# Patient Record
Sex: Female | Born: 1935 | Race: Black or African American | Hispanic: No | Smoking: Former smoker
Health system: Southern US, Community
[De-identification: ages and names within clinical notes are randomized; demographics above are authoritative.]

## PROBLEM LIST (undated history)

## (undated) ENCOUNTER — Emergency Department (HOSPITAL_COMMUNITY): Admission: EM | Payer: Medicare Other | Source: Home / Self Care

## (undated) DIAGNOSIS — E119 Type 2 diabetes mellitus without complications: Secondary | ICD-10-CM

## (undated) DIAGNOSIS — K219 Gastro-esophageal reflux disease without esophagitis: Secondary | ICD-10-CM

## (undated) DIAGNOSIS — E7800 Pure hypercholesterolemia, unspecified: Secondary | ICD-10-CM

## (undated) DIAGNOSIS — I639 Cerebral infarction, unspecified: Secondary | ICD-10-CM

## (undated) DIAGNOSIS — I447 Left bundle-branch block, unspecified: Secondary | ICD-10-CM

## (undated) DIAGNOSIS — I10 Essential (primary) hypertension: Secondary | ICD-10-CM

## (undated) DIAGNOSIS — E039 Hypothyroidism, unspecified: Secondary | ICD-10-CM

## (undated) DIAGNOSIS — I420 Dilated cardiomyopathy: Secondary | ICD-10-CM

## (undated) DIAGNOSIS — I5042 Chronic combined systolic (congestive) and diastolic (congestive) heart failure: Secondary | ICD-10-CM

## (undated) DIAGNOSIS — I2510 Atherosclerotic heart disease of native coronary artery without angina pectoris: Secondary | ICD-10-CM

## (undated) DIAGNOSIS — M1990 Unspecified osteoarthritis, unspecified site: Secondary | ICD-10-CM

## (undated) HISTORY — DX: Pure hypercholesterolemia, unspecified: E78.00

## (undated) HISTORY — DX: Dilated cardiomyopathy: I42.0

## (undated) HISTORY — DX: Cerebral infarction, unspecified: I63.9

## (undated) HISTORY — DX: Hypothyroidism, unspecified: E03.9

## (undated) HISTORY — PX: OTHER SURGICAL HISTORY: SHX169

## (undated) HISTORY — DX: Unspecified osteoarthritis, unspecified site: M19.90

## (undated) HISTORY — DX: Left bundle-branch block, unspecified: I44.7

## (undated) HISTORY — PX: BI-VENTRICULAR PACEMAKER INSERTION (CRT-P): SHX5750

---

## 1978-08-12 HISTORY — PX: ABDOMINAL HYSTERECTOMY: SHX81

## 2000-06-18 ENCOUNTER — Encounter: Payer: Self-pay | Admitting: Emergency Medicine

## 2000-06-18 ENCOUNTER — Emergency Department (HOSPITAL_COMMUNITY): Admission: EM | Admit: 2000-06-18 | Discharge: 2000-06-18 | Payer: Self-pay | Admitting: Emergency Medicine

## 2001-02-18 ENCOUNTER — Emergency Department (HOSPITAL_COMMUNITY): Admission: EM | Admit: 2001-02-18 | Discharge: 2001-02-18 | Payer: Self-pay | Admitting: Emergency Medicine

## 2002-10-29 ENCOUNTER — Emergency Department (HOSPITAL_COMMUNITY): Admission: EM | Admit: 2002-10-29 | Discharge: 2002-10-29 | Payer: Self-pay | Admitting: Emergency Medicine

## 2004-06-28 ENCOUNTER — Ambulatory Visit: Payer: Self-pay | Admitting: Endocrinology

## 2004-08-12 HISTORY — PX: OTHER SURGICAL HISTORY: SHX169

## 2004-08-22 ENCOUNTER — Ambulatory Visit: Payer: Self-pay | Admitting: Endocrinology

## 2004-08-29 ENCOUNTER — Ambulatory Visit: Payer: Self-pay | Admitting: Endocrinology

## 2004-11-19 ENCOUNTER — Ambulatory Visit: Payer: Self-pay | Admitting: Endocrinology

## 2004-11-27 ENCOUNTER — Encounter: Admission: RE | Admit: 2004-11-27 | Discharge: 2004-11-27 | Payer: Self-pay | Admitting: Endocrinology

## 2004-12-03 ENCOUNTER — Ambulatory Visit: Payer: Self-pay | Admitting: Endocrinology

## 2005-01-08 ENCOUNTER — Ambulatory Visit: Payer: Self-pay | Admitting: Endocrinology

## 2005-01-15 ENCOUNTER — Ambulatory Visit: Payer: Self-pay | Admitting: Endocrinology

## 2005-03-25 ENCOUNTER — Ambulatory Visit: Payer: Self-pay | Admitting: Family Medicine

## 2005-05-31 ENCOUNTER — Ambulatory Visit: Payer: Self-pay | Admitting: Family Medicine

## 2005-08-15 ENCOUNTER — Ambulatory Visit: Payer: Self-pay | Admitting: Family Medicine

## 2005-09-19 ENCOUNTER — Ambulatory Visit: Payer: Self-pay | Admitting: Family Medicine

## 2006-01-28 ENCOUNTER — Ambulatory Visit: Payer: Self-pay | Admitting: Family Medicine

## 2006-04-28 ENCOUNTER — Ambulatory Visit: Payer: Self-pay | Admitting: Family Medicine

## 2006-06-19 ENCOUNTER — Ambulatory Visit: Payer: Self-pay | Admitting: Family Medicine

## 2006-08-14 ENCOUNTER — Ambulatory Visit: Payer: Self-pay | Admitting: Family Medicine

## 2006-08-14 LAB — CONVERTED CEMR LAB
ALT: 15 units/L (ref 0–40)
AST: 20 units/L (ref 0–37)
Albumin: 3.8 g/dL (ref 3.5–5.2)
Alkaline Phosphatase: 46 units/L (ref 39–117)
BUN: 10 mg/dL (ref 6–23)
CO2: 28 meq/L (ref 19–32)
Calcium: 9.4 mg/dL (ref 8.4–10.5)
Chloride: 103 meq/L (ref 96–112)
Creatinine, Ser: 0.8 mg/dL (ref 0.4–1.2)
Creatinine,U: 130 mg/dL
GFR calc non Af Amer: 75 mL/min
Glomerular Filtration Rate, Af Am: 91 mL/min/{1.73_m2}
Glucose, Bld: 112 mg/dL — ABNORMAL HIGH (ref 70–99)
Hgb A1c MFr Bld: 8.6 % — ABNORMAL HIGH (ref 4.6–6.0)
Microalb Creat Ratio: 9.2 mg/g (ref 0.0–30.0)
Microalb, Ur: 1.2 mg/dL (ref 0.0–1.9)
Potassium: 3.9 meq/L (ref 3.5–5.1)
Sodium: 139 meq/L (ref 135–145)
Total Bilirubin: 0.6 mg/dL (ref 0.3–1.2)
Total Protein: 7.7 g/dL (ref 6.0–8.3)

## 2006-08-18 ENCOUNTER — Encounter: Payer: Self-pay | Admitting: Internal Medicine

## 2006-08-21 ENCOUNTER — Ambulatory Visit: Payer: Self-pay | Admitting: Family Medicine

## 2007-01-06 ENCOUNTER — Ambulatory Visit: Payer: Self-pay | Admitting: Family Medicine

## 2007-01-06 LAB — CONVERTED CEMR LAB
ALT: 13 units/L (ref 0–40)
AST: 20 units/L (ref 0–37)
Albumin: 3.6 g/dL (ref 3.5–5.2)
Alkaline Phosphatase: 47 units/L (ref 39–117)
BUN: 10 mg/dL (ref 6–23)
Bilirubin, Direct: 0.1 mg/dL (ref 0.0–0.3)
CO2: 29 meq/L (ref 19–32)
Calcium: 9.5 mg/dL (ref 8.4–10.5)
Chloride: 105 meq/L (ref 96–112)
Cholesterol: 258 mg/dL (ref 0–200)
Creatinine, Ser: 0.8 mg/dL (ref 0.4–1.2)
Direct LDL: 153.9 mg/dL
GFR calc Af Amer: 91 mL/min
GFR calc non Af Amer: 75 mL/min
Glucose, Bld: 96 mg/dL (ref 70–99)
HDL: 32.7 mg/dL — ABNORMAL LOW (ref >39.0)
Hgb A1c MFr Bld: 8.5 % — ABNORMAL HIGH (ref 4.6–6.0)
Potassium: 4.1 meq/L (ref 3.5–5.1)
Sodium: 140 meq/L (ref 135–145)
TSH: 1.53 microintl units/mL (ref 0.35–5.50)
Total Bilirubin: 0.5 mg/dL (ref 0.3–1.2)
Total CHOL/HDL Ratio: 7.9
Total Protein: 7.9 g/dL (ref 6.0–8.3)
Triglycerides: 245 mg/dL (ref 0–149)
VLDL: 49 mg/dL — ABNORMAL HIGH (ref 0–40)

## 2007-01-14 DIAGNOSIS — E039 Hypothyroidism, unspecified: Secondary | ICD-10-CM | POA: Insufficient documentation

## 2007-01-19 ENCOUNTER — Ambulatory Visit: Payer: Self-pay | Admitting: Family Medicine

## 2007-01-19 DIAGNOSIS — E109 Type 1 diabetes mellitus without complications: Secondary | ICD-10-CM | POA: Insufficient documentation

## 2007-01-19 DIAGNOSIS — E1140 Type 2 diabetes mellitus with diabetic neuropathy, unspecified: Secondary | ICD-10-CM

## 2007-01-19 DIAGNOSIS — E785 Hyperlipidemia, unspecified: Secondary | ICD-10-CM

## 2007-01-20 ENCOUNTER — Telehealth (INDEPENDENT_AMBULATORY_CARE_PROVIDER_SITE_OTHER): Payer: Self-pay | Admitting: *Deleted

## 2007-01-26 ENCOUNTER — Telehealth (INDEPENDENT_AMBULATORY_CARE_PROVIDER_SITE_OTHER): Payer: Self-pay | Admitting: *Deleted

## 2007-04-20 ENCOUNTER — Encounter: Payer: Self-pay | Admitting: Family Medicine

## 2007-07-20 ENCOUNTER — Encounter: Payer: Self-pay | Admitting: Family Medicine

## 2007-07-30 ENCOUNTER — Telehealth: Payer: Self-pay | Admitting: Internal Medicine

## 2007-08-19 ENCOUNTER — Encounter: Admission: RE | Admit: 2007-08-19 | Discharge: 2007-08-19 | Payer: Self-pay | Admitting: Urology

## 2007-08-21 ENCOUNTER — Ambulatory Visit (HOSPITAL_BASED_OUTPATIENT_CLINIC_OR_DEPARTMENT_OTHER): Admission: RE | Admit: 2007-08-21 | Discharge: 2007-08-21 | Payer: Self-pay | Admitting: Urology

## 2007-11-30 ENCOUNTER — Encounter: Payer: Self-pay | Admitting: Family Medicine

## 2008-03-14 ENCOUNTER — Encounter: Payer: Self-pay | Admitting: Family Medicine

## 2008-06-20 ENCOUNTER — Encounter: Payer: Self-pay | Admitting: Family Medicine

## 2009-06-21 ENCOUNTER — Ambulatory Visit: Payer: Self-pay | Admitting: Family Medicine

## 2009-08-14 ENCOUNTER — Ambulatory Visit: Payer: Self-pay | Admitting: Family Medicine

## 2009-08-17 ENCOUNTER — Ambulatory Visit: Payer: Self-pay | Admitting: Family Medicine

## 2009-08-17 LAB — CONVERTED CEMR LAB
ALT: 12 units/L (ref 0–35)
AST: 21 units/L (ref 0–37)
Albumin: 3.6 g/dL (ref 3.5–5.2)
Alkaline Phosphatase: 42 units/L (ref 39–117)
BUN: 13 mg/dL (ref 6–23)
Basophils Absolute: 0 10*3/uL (ref 0.0–0.1)
Basophils Relative: 0.6 % (ref 0.0–3.0)
Bilirubin, Direct: 0.1 mg/dL (ref 0.0–0.3)
CO2: 28 meq/L (ref 19–32)
Calcium: 9.5 mg/dL (ref 8.4–10.5)
Chloride: 106 meq/L (ref 96–112)
Cholesterol: 113 mg/dL (ref 0–200)
Creatinine, Ser: 0.8 mg/dL (ref 0.4–1.2)
Eosinophils Absolute: 0.1 10*3/uL (ref 0.0–0.7)
Eosinophils Relative: 1.3 % (ref 0.0–5.0)
GFR calc non Af Amer: 90.36 mL/min (ref >60)
Glucose, Bld: 144 mg/dL — ABNORMAL HIGH (ref 70–99)
HCT: 33.7 % — ABNORMAL LOW (ref 36.0–46.0)
HDL: 43.3 mg/dL (ref >39.00)
Hemoglobin: 11.1 g/dL — ABNORMAL LOW (ref 12.0–15.0)
Hgb A1c MFr Bld: 8.4 % — ABNORMAL HIGH (ref 4.6–6.5)
LDL Cholesterol: 47 mg/dL (ref 0–99)
Lymphocytes Relative: 39.5 % (ref 12.0–46.0)
Lymphs Abs: 2.4 10*3/uL (ref 0.7–4.0)
MCHC: 32.8 g/dL (ref 30.0–36.0)
MCV: 85.1 fL (ref 78.0–100.0)
Monocytes Absolute: 0.5 10*3/uL (ref 0.1–1.0)
Monocytes Relative: 8.3 % (ref 3.0–12.0)
Neutro Abs: 3.1 10*3/uL (ref 1.4–7.7)
Neutrophils Relative %: 50.3 % (ref 43.0–77.0)
Platelets: 243 10*3/uL (ref 150.0–400.0)
Potassium: 4 meq/L (ref 3.5–5.1)
RBC: 3.96 M/uL (ref 3.87–5.11)
RDW: 15.7 % — ABNORMAL HIGH (ref 11.5–14.6)
Sodium: 141 meq/L (ref 135–145)
TSH: 2.94 microintl units/mL (ref 0.35–5.50)
Total Bilirubin: 0.5 mg/dL (ref 0.3–1.2)
Total CHOL/HDL Ratio: 3
Total Protein: 7.4 g/dL (ref 6.0–8.3)
Triglycerides: 116 mg/dL (ref 0.0–149.0)
VLDL: 23.2 mg/dL (ref 0.0–40.0)
WBC: 6.1 10*3/uL (ref 4.5–10.5)

## 2009-08-18 ENCOUNTER — Telehealth (INDEPENDENT_AMBULATORY_CARE_PROVIDER_SITE_OTHER): Payer: Self-pay | Admitting: *Deleted

## 2009-08-18 ENCOUNTER — Telehealth: Payer: Self-pay | Admitting: Family Medicine

## 2009-08-21 ENCOUNTER — Telehealth: Payer: Self-pay | Admitting: Family Medicine

## 2010-04-04 ENCOUNTER — Ambulatory Visit: Payer: Self-pay | Admitting: Internal Medicine

## 2010-04-04 DIAGNOSIS — M1990 Unspecified osteoarthritis, unspecified site: Secondary | ICD-10-CM | POA: Insufficient documentation

## 2010-04-04 DIAGNOSIS — E559 Vitamin D deficiency, unspecified: Secondary | ICD-10-CM | POA: Insufficient documentation

## 2010-04-04 DIAGNOSIS — E1149 Type 2 diabetes mellitus with other diabetic neurological complication: Secondary | ICD-10-CM

## 2010-04-04 LAB — CONVERTED CEMR LAB
ALT: 13 units/L (ref 0–35)
AST: 20 units/L (ref 0–37)
Albumin: 4.2 g/dL (ref 3.5–5.2)
Alkaline Phosphatase: 62 units/L (ref 39–117)
BUN: 13 mg/dL (ref 6–23)
Basophils Absolute: 0 10*3/uL (ref 0.0–0.1)
Basophils Relative: 0.6 % (ref 0.0–3.0)
Bilirubin Urine: NEGATIVE
Bilirubin, Direct: 0.1 mg/dL (ref 0.0–0.3)
CO2: 26 meq/L (ref 19–32)
Calcium: 10.1 mg/dL (ref 8.4–10.5)
Chloride: 103 meq/L (ref 96–112)
Cholesterol: 149 mg/dL (ref 0–200)
Creatinine, Ser: 0.8 mg/dL (ref 0.4–1.2)
Eosinophils Absolute: 0.1 10*3/uL (ref 0.0–0.7)
Eosinophils Relative: 1.2 % (ref 0.0–5.0)
Folate: 14.7 ng/mL
GFR calc non Af Amer: 85.27 mL/min (ref >60)
Glucose, Bld: 159 mg/dL — ABNORMAL HIGH (ref 70–99)
HCT: 37.7 % (ref 36.0–46.0)
HDL: 53.5 mg/dL (ref >39.00)
Hemoglobin: 12.5 g/dL (ref 12.0–15.0)
Hgb A1c MFr Bld: 9.4 % — ABNORMAL HIGH (ref 4.6–6.5)
Ketones, ur: NEGATIVE mg/dL
LDL Cholesterol: 80 mg/dL (ref 0–99)
Leukocytes, UA: NEGATIVE
Lymphocytes Relative: 47.2 % — ABNORMAL HIGH (ref 12.0–46.0)
Lymphs Abs: 2.9 10*3/uL (ref 0.7–4.0)
MCHC: 33.1 g/dL (ref 30.0–36.0)
MCV: 83.5 fL (ref 78.0–100.0)
Monocytes Absolute: 0.3 10*3/uL (ref 0.1–1.0)
Monocytes Relative: 4.9 % (ref 3.0–12.0)
Neutro Abs: 2.8 10*3/uL (ref 1.4–7.7)
Neutrophils Relative %: 46.1 % (ref 43.0–77.0)
Nitrite: NEGATIVE
Platelets: 261 10*3/uL (ref 150.0–400.0)
Potassium: 4.3 meq/L (ref 3.5–5.1)
RBC: 4.51 M/uL (ref 3.87–5.11)
RDW: 16.5 % — ABNORMAL HIGH (ref 11.5–14.6)
Sodium: 140 meq/L (ref 135–145)
Specific Gravity, Urine: 1.01 (ref 1.000–1.030)
TSH: 10.04 microintl units/mL — ABNORMAL HIGH (ref 0.35–5.50)
Total Bilirubin: 0.5 mg/dL (ref 0.3–1.2)
Total CHOL/HDL Ratio: 3
Total Protein, Urine: NEGATIVE mg/dL
Total Protein: 8.1 g/dL (ref 6.0–8.3)
Triglycerides: 76 mg/dL (ref 0.0–149.0)
Urine Glucose: NEGATIVE mg/dL
Urobilinogen, UA: 0.2 (ref 0.0–1.0)
VLDL: 15.2 mg/dL (ref 0.0–40.0)
Vit D, 25-Hydroxy: 39 ng/mL (ref 30–89)
Vitamin B-12: 300 pg/mL (ref 211–911)
WBC: 6.1 10*3/uL (ref 4.5–10.5)
pH: 6 (ref 5.0–8.0)

## 2010-04-11 ENCOUNTER — Ambulatory Visit: Payer: Self-pay | Admitting: Internal Medicine

## 2010-04-19 ENCOUNTER — Telehealth: Payer: Self-pay | Admitting: Internal Medicine

## 2010-04-20 ENCOUNTER — Encounter: Payer: Self-pay | Admitting: Internal Medicine

## 2010-04-20 ENCOUNTER — Telehealth: Payer: Self-pay | Admitting: Internal Medicine

## 2010-04-20 ENCOUNTER — Telehealth (INDEPENDENT_AMBULATORY_CARE_PROVIDER_SITE_OTHER): Payer: Self-pay | Admitting: *Deleted

## 2010-04-26 ENCOUNTER — Telehealth: Payer: Self-pay | Admitting: Internal Medicine

## 2010-05-11 ENCOUNTER — Ambulatory Visit: Payer: Self-pay | Admitting: Internal Medicine

## 2010-09-13 NOTE — Progress Notes (Signed)
Summary: formulary alternative to Livalo--in process  Phone Note Other Incoming   Caller: [facility] Summary of Call: Received fax from [facility] requesting forumlary alternative or prior auth for Livalo. fax# [phone].  Form forwarded to provider for completion. Nicki Guadalajara Fergerson CMA Duncan Dull)  April 20, 2010 11:11 AM   Follow-up for Phone Call        Form completed and faxed to [phone]. Nicki Guadalajara Fergerson CMA Duncan Dull)  April 20, 2010 2:34 PM   Approved 04/23/10 case # [id number]. Follow-up by: Dagoberto Reef,  May 04, 2010 11:28 AM

## 2010-09-13 NOTE — Progress Notes (Signed)
Summary: RF  Phone Note Refill Request   Refills Requested: Medication #1:  ACCU-CHEK AVIVA  STRP check once daily dx: 250.00  Medication #2:  ACCU-CHEK MULTICLIX LANCETS  MISC test once daily dx:250.00. Pt informed, complete  Initial call taken by: Lamar Sprinkles, CMA,  April 26, 2010 10:37 AM    New/Updated Medications: ACCU-CHEK AVIVA  STRP (GLUCOSE BLOOD) check once daily dx: 250.00 ACCU-CHEK MULTICLIX LANCETS  MISC (LANCETS) test once daily dx:250.00 Prescriptions: ACCU-CHEK MULTICLIX LANCETS  MISC (LANCETS) test once daily dx:250.00  #100 x 3   Entered by:   Lamar Sprinkles, CMA   Authorized by:   [name] MD   Signed by:   Lamar Sprinkles, CMA on 04/26/2010   Method used:   Faxed to ...       [facility] [phone] (retail)       [address] [address]       [place], [place]  [id number]       Ph: [phone]       Fax: [phone]   RxID:   [id number] ACCU-CHEK AVIVA  STRP (GLUCOSE BLOOD) check once daily dx: 250.00  #100 x 3   Entered by:   Lamar Sprinkles, CMA   Authorized by:   [name] MD   Signed by:   Lamar Sprinkles, CMA on 04/26/2010   Method used:   Faxed to ...       [facility] [phone] (retail)       [address] [address]       [place], [place]  [id number]       Ph: [phone]       Fax: [phone]   RxID:   [id number]

## 2010-09-13 NOTE — Assessment & Plan Note (Signed)
Summary: NEW/ MEDICARE/TRICARE/ NWS  #   Vital Signs:  Patient profile:   77 year old female Height:      63 inches Weight:      148 pounds BMI:     26.31 O2 Sat:      95 % on Room air Temp:     99.2 degrees F oral Pulse rate:   84 / minute Pulse rhythm:   regular Resp:     16 per minute BP sitting:   140 / 82  (left arm) Cuff size:   large  Vitals Entered By: Rock Nephew CMA (April 04, 2010 1:19 PM)  Nutrition Counseling: Patient's BMI is greater than 25 and therefore counseled on weight management options.  O2 Flow:  Room air CC: New pt transfer, Preventive Care Is Patient Diabetic? Yes Did you bring your meter with you today? No Pain Assessment Patient in pain? no       Does patient need assistance? Functional Status Self care Ambulation Normal   Primary Care Radiah Lubinski:  [name] MD  CC:  New pt transfer and Preventive Care.  History of Present Illness: New to me she wants a new PCP. She reports today to discuss a long standing hx. of "nerve pains" in her legs and feet that occurs only at night and very intermittently. She says that the pain is sometimes a "cramp". It appears that she has had the presumed diagnosis of DPN but I see no evidence of a NCS/EMG being done and her Endo notes state that her s/s are atypical for DPN. Also, she has tried the appropriate meds for DPN ( Elavil, lyrica, tramadol ) and she did not improve. Her pain is not claudication.  Preventive Screening-Counseling & Management  Alcohol-Tobacco     Alcohol drinks/day: 0     Smoking Status: never  Caffeine-Diet-Exercise     Does Patient Exercise: no  Hep-HIV-STD-Contraception     Hepatitis Risk: no risk noted     HIV Risk: no risk noted     STD Risk: no risk noted      Drug Use:  no.        Blood Transfusions:  no.    Clinical Review Panels:  Lipid Management   Cholesterol:  113 (08/14/2009)   LDL (bad choesterol):  47 (08/14/2009)   HDL (good cholesterol):  43.30  (08/14/2009)  Diabetes Management   HgBA1C:  8.4 (08/14/2009)   Creatinine:  0.8 (08/14/2009)   Last Foot Exam:  yes (04/04/2010)   Last Flu Vaccine:  Fluvax 3+ (06/21/2009)  CBC   WBC:  6.1 (08/14/2009)   RBC:  3.96 (08/14/2009)   Hgb:  11.1 (08/14/2009)   Hct:  33.7 (08/14/2009)   Platelets:  243.0 (08/14/2009)   MCV  85.1 (08/14/2009)   MCHC  32.8 (08/14/2009)   RDW  15.7 (08/14/2009)   PMN:  50.3 (08/14/2009)   Lymphs:  39.5 (08/14/2009)   Monos:  8.3 (08/14/2009)   Eosinophils:  1.3 (08/14/2009)   Basophil:  0.6 (08/14/2009)  Complete Metabolic Panel   Glucose:  144 (08/14/2009)   Sodium:  141 (08/14/2009)   Potassium:  4.0 (08/14/2009)   Chloride:  106 (08/14/2009)   CO2:  28 (08/14/2009)   BUN:  13 (08/14/2009)   Creatinine:  0.8 (08/14/2009)   Albumin:  3.6 (08/14/2009)   Total Protein:  7.4 (08/14/2009)   Calcium:  9.5 (08/14/2009)   Total Bili:  0.5 (08/14/2009)   Alk Phos:  42 (08/14/2009)  SGPT (ALT):  12 (08/14/2009)   SGOT (AST):  21 (08/14/2009)   Medications Prior to Update: 1)  Metformin Hcl 1000 Mg Tabs (Metformin Hcl) 2)  Amaryl 4 Mg Tabs (Glimepiride) 3)  Actos 45 Mg Tabs (Pioglitazone Hcl) .Marland Kitchen.. 1 By Mouth Once Daily 4)  Vytorin 10-40 Mg Tabs (Ezetimibe-Simvastatin) .Marland Kitchen.. 1 By Mouth Once Daily 5)  Synthroid 88 Mcg Tabs (Levothyroxine Sodium) .Marland Kitchen.. 1 By Mouth Once Daily 6)  Vitamin D (Ergocalciferol) 50000 Unit Caps (Ergocalciferol) .Marland Kitchen.. 1 By Mouth Once Weekly. 7)  Ultram 50 Mg Tabs (Tramadol Hcl) .Marland Kitchen.. 1-2 By Mouth Every 6 Hours As Needed 8)  Ultracet 37.5-325 Mg Tabs (Tramadol-Acetaminophen) .Marland Kitchen.. 1-2 By Mouth Every 6 Hours. Do Not Take Tylenol With This Medication.  Current Medications (verified): 1)  Metformin Hcl 1000 Mg Tabs (Metformin Hcl) 2)  Amaryl 4 Mg Tabs (Glimepiride) 3)  Actos 45 Mg Tabs (Pioglitazone Hcl) .Marland Kitchen.. 1 By Mouth Once Daily 4)  Synthroid 100 Mcg Tabs (Levothyroxine Sodium) .... One By Mouth Once Daily 5)  Vitamin D  (Ergocalciferol) 50000 Unit Caps (Ergocalciferol) .Marland Kitchen.. 1 By Mouth Once Weekly. 6)  Livalo 4 Mg Tabs (Pitavastatin Calcium) .... One By Mouth Once Daily For Cholesterol  Allergies (verified): No Known Drug Allergies  Comments:  Nurse/Medical Assistant: The patient's medications were reviewed with the patient and were updated in the Medication List. Rock Nephew CMA (April 04, 2010 1:20 PM)  Past History:  Family History: Last updated: 01/14/2007 Family History Diabetes 1st degree relative Family History Hypertension Family History Uterine cancer  Social History: Last updated: 04/04/2010 Occupation: Retired Engineer, civil (consulting) Alcohol use-no Divorced Never Smoked Drug use-no Regular exercise-no  Risk Factors: Alcohol Use: 0 (04/04/2010) Exercise: no (04/04/2010)  Risk Factors: Smoking Status: never (04/04/2010)  Past Medical History: Hypothyroidism High Cholesterol Osteoarthritis Diabetes mellitus, type II  Past Surgical History: Reviewed history from 01/14/2007 and no changes required. Hysterectomy-1980 Colonoscopy-2006 BMD-2006  Family History: Reviewed history from 01/14/2007 and no changes required. Family History Diabetes 1st degree relative Family History Hypertension Family History Uterine cancer  Social History: Reviewed history from 01/14/2007 and no changes required. Occupation: Retired Engineer, civil (consulting) Alcohol use-no Divorced Never Smoked Drug use-no Regular exercise-no Smoking Status:  never Hepatitis Risk:  no risk noted HIV Risk:  no risk noted STD Risk:  no risk noted Blood Transfusions:  no Drug Use:  no Does Patient Exercise:  no  Review of Systems  The patient denies anorexia, fever, weight loss, weight gain, chest pain, syncope, dyspnea on exertion, peripheral edema, prolonged cough, headaches, hemoptysis, abdominal pain, hematuria, muscle weakness, suspicious skin lesions, depression, enlarged lymph nodes, and angioedema.   MS:  Complains of joint  pain and cramps; denies joint redness, joint swelling, loss of strength, low back pain, muscle aches, muscle weakness, stiffness, and thoracic pain. Endo:  Denies cold intolerance, excessive hunger, excessive thirst, excessive urination, heat intolerance, polyuria, and weight change.  Physical Exam  General:  alert, well-developed, well-nourished, well-hydrated, appropriate dress, normal appearance, healthy-appearing, cooperative to examination, and good hygiene.   Head:  normocephalic, atraumatic, no abnormalities observed, and no abnormalities palpated.   Eyes:  vision grossly intact, pupils equal, pupils round, and pupils reactive to light.   Ears:  R ear normal and L ear normal.   Mouth:  Oral mucosa and oropharynx without lesions or exudates.  Teeth in good repair. Neck:  supple, full ROM, no masses, no thyromegaly, no JVD, normal carotid upstroke, no carotid bruits, no cervical lymphadenopathy, and no neck tenderness.  Lungs:  normal respiratory effort, no intercostal retractions, no accessory muscle use, normal breath sounds, no dullness, no fremitus, no crackles, and no wheezes.   Heart:  normal rate, regular rhythm, no murmur, no gallop, no rub, and no JVD.   Abdomen:  soft, non-tender, normal bowel sounds, no distention, no masses, no guarding, no rigidity, no rebound tenderness, no abdominal hernia, no inguinal hernia, no hepatomegaly, and no splenomegaly.   Msk:  normal ROM, no joint tenderness, no joint swelling, no joint warmth, no redness over joints, no joint deformities, no joint instability, and no crepitation.   Pulses:  R and L carotid,radial,femoral,dorsalis pedis and posterior tibial pulses are full and equal bilaterally Extremities:  No clubbing, cyanosis, edema, or deformity noted with normal full range of motion of all joints.   Neurologic:  alert & oriented X3, cranial nerves II-XII intact, strength normal in all extremities, sensation intact to light touch, sensation  intact to pinprick, gait normal, and DTRs symmetrical and normal.   Skin:  turgor normal, color normal, no rashes, no suspicious lesions, no ecchymoses, no petechiae, no purpura, no ulcerations, and no edema.   Cervical Nodes:  no anterior cervical adenopathy and no posterior cervical adenopathy.   Axillary Nodes:  no R axillary adenopathy and no L axillary adenopathy.   Inguinal Nodes:  no R inguinal adenopathy and no L inguinal adenopathy.   Psych:  Oriented X3, memory intact for recent and remote, normally interactive, good eye contact, not anxious appearing, not depressed appearing, not agitated, not suicidal, not homicidal, easily distracted, and poor concentration.    Diabetes Management Exam:    Foot Exam (with socks and/or shoes not present):       Sensory-Pinprick/Light touch:          Left medial foot (L-4): normal          Left dorsal foot (L-5): normal          Left lateral foot (S-1): normal          Right medial foot (L-4): normal          Right dorsal foot (L-5): normal          Right lateral foot (S-1): normal       Sensory-Monofilament:          Left foot: normal          Right foot: normal       Inspection:          Left foot: normal          Right foot: normal       Nails:          Left foot: normal          Right foot: normal   Impression & Recommendations:  Problem # 1:  HYPOTHYROIDISM (ICD-244.9) her TSH is a little high so I will increase her Synthroid dose Her updated medication list for this problem includes:    Synthroid 100 Mcg Tabs (Levothyroxine sodium) ..... One by mouth once daily  Orders: Venipuncture (47829) T-Vitamin D (25-Hydroxy) [phone]) TLB-Lipid Panel (80061-LIPID) TLB-BMP (Basic Metabolic Panel-BMET) (80048-METABOL) TLB-CBC Platelet - w/Differential (85025-CBCD) TLB-Hepatic/Liver Function Pnl (80076-HEPATIC) TLB-TSH (Thyroid Stimulating Hormone) (84443-TSH) TLB-B12 + Folate Pnl (84696_29528-U13/KGM) TLB-A1C / Hgb A1C  (Glycohemoglobin) (83036-A1C) TLB-Udip w/ Micro (81001-URINE)  Problem # 2:  DIABETES MELLITUS, TYPE II (ICD-250.00) Assessment: Deteriorated  Her updated medication list for this problem includes:    Metformin Hcl 1000 Mg Tabs (Metformin hcl)  Amaryl 4 Mg Tabs (Glimepiride)    Actos 45 Mg Tabs (Pioglitazone hcl) .Marland Kitchen... 1 by mouth once daily  Labs Reviewed: Creat: 0.8 (08/14/2009)    Reviewed HgBA1c results: 8.4 (08/14/2009)  8.5 (01/06/2007)  Orders: Ophthalmology Referral (Ophthalmology)  Problem # 3:  VITAMIN D DEFICIENCY (ICD-268.9) Assessment: Unchanged  Orders: Venipuncture (19147) T-Vitamin D (25-Hydroxy) (82956-21308) T-Bone Densitometry (65784) TLB-Lipid Panel (80061-LIPID) TLB-BMP (Basic Metabolic Panel-BMET) (80048-METABOL) TLB-CBC Platelet - w/Differential (85025-CBCD) TLB-Hepatic/Liver Function Pnl (80076-HEPATIC) TLB-TSH (Thyroid Stimulating Hormone) (84443-TSH) TLB-B12 + Folate Pnl (82746_82607-B12/FOL) TLB-A1C / Hgb A1C (Glycohemoglobin) (83036-A1C) TLB-Udip w/ Micro (81001-URINE)  Problem # 4:  OSTEOARTHRITIS (ICD-715.90) Assessment: Unchanged  The following medications were removed from the medication list:    Ultram 50 Mg Tabs (Tramadol hcl) .Marland Kitchen... 1-2 by mouth every 6 hours as needed    Ultracet 37.5-325 Mg Tabs (Tramadol-acetaminophen) .Marland Kitchen... 1-2 by mouth every 6 hours. do not take tylenol with this medication.  Orders: Venipuncture (69629) T-Vitamin D (25-Hydroxy) (52841-32440) TLB-Lipid Panel (80061-LIPID) TLB-BMP (Basic Metabolic Panel-BMET) (80048-METABOL) TLB-CBC Platelet - w/Differential (85025-CBCD) TLB-Hepatic/Liver Function Pnl (80076-HEPATIC) TLB-TSH (Thyroid Stimulating Hormone) (84443-TSH) TLB-B12 + Folate Pnl (10272_53664-Q03/KVQ) TLB-A1C / Hgb A1C (Glycohemoglobin) (83036-A1C) TLB-Udip w/ Micro (81001-URINE)  Problem # 5:  DIABETIC PERIPHERAL NEUROPATHY (ICD-250.60) Assessment: Deteriorated  Her updated medication list for  this problem includes:    Metformin Hcl 1000 Mg Tabs (Metformin hcl)    Amaryl 4 Mg Tabs (Glimepiride)    Actos 45 Mg Tabs (Pioglitazone hcl) .Marland Kitchen... 1 by mouth once daily  Orders: Venipuncture (25956) T-Vitamin D (25-Hydroxy) (38756-43329) TLB-Lipid Panel (80061-LIPID) TLB-BMP (Basic Metabolic Panel-BMET) (80048-METABOL) TLB-CBC Platelet - w/Differential (85025-CBCD) TLB-Hepatic/Liver Function Pnl (80076-HEPATIC) TLB-TSH (Thyroid Stimulating Hormone) (84443-TSH) TLB-B12 + Folate Pnl (51884_16606-T01/SWF) TLB-A1C / Hgb A1C (Glycohemoglobin) (83036-A1C) TLB-Udip w/ Micro (81001-URINE) Neurology Referral (Neuro)  Problem # 6:  HYPERLIPIDEMIA, MIXED (ICD-272.2) Assessment: Deteriorated will stop simva and see if this helps with pain , also will start Livalo since it has a lower incidence of causing myopathy The following medications were removed from the medication list:    Vytorin 10-40 Mg Tabs (Ezetimibe-simvastatin) .Marland Kitchen... 1 by mouth once daily Her updated medication list for this problem includes:    Livalo 4 Mg Tabs (Pitavastatin calcium) ..... One by mouth once daily for cholesterol  Orders: Venipuncture (09323) T-Vitamin D (25-Hydroxy) (55732-20254) TLB-Lipid Panel (80061-LIPID) TLB-BMP (Basic Metabolic Panel-BMET) (80048-METABOL) TLB-CBC Platelet - w/Differential (85025-CBCD) TLB-Hepatic/Liver Function Pnl (80076-HEPATIC) TLB-TSH (Thyroid Stimulating Hormone) (84443-TSH) TLB-B12 + Folate Pnl (27062_37628-B15/VVO) TLB-A1C / Hgb A1C (Glycohemoglobin) (83036-A1C) TLB-Udip w/ Micro (81001-URINE)  Complete Medication List: 1)  Metformin Hcl 1000 Mg Tabs (Metformin hcl) 2)  Amaryl 4 Mg Tabs (Glimepiride) 3)  Actos 45 Mg Tabs (Pioglitazone hcl) .Marland Kitchen.. 1 by mouth once daily 4)  Synthroid 100 Mcg Tabs (Levothyroxine sodium) .... One by mouth once daily 5)  Vitamin D (ergocalciferol) 50000 Unit Caps (Ergocalciferol) .Marland Kitchen.. 1 by mouth once weekly. 6)  Livalo 4 Mg Tabs (Pitavastatin  calcium) .... One by mouth once daily for cholesterol  Other Orders: Radiology Referral (Radiology)  Colorectal Screening:  Current Recommendations:    Colonoscopy recommended: scheduled with G.[facility].  Osteoporosis Risk Assessment:  Risk Factors for Fracture or Low Bone Density:   Smoking status:       never   Thyroid replacement:     yes   Thyroid disease:     yes  Immunization & Chemoprophylaxis:    Influenza vaccine: Fluvax 3+  (06/21/2009)  Patient Instructions: 1)  Please schedule a follow-up appointment in  1 month. 2)  It is important that you exercise regularly at least 20 minutes 5 times a week. If you develop chest pain, have severe difficulty breathing, or feel very tired , stop exercising immediately and seek medical attention. 3)  You need to lose weight. Consider a lower calorie diet and regular exercise.  4)  Schedule your mammogram. 5)  Take an Aspirin every day. 6)  Check your blood sugars regularly. If your readings are usually above 200 or below 70 you should contact our office. 7)  It is important that your Diabetic A1c level is checked every 3 months. 8)  See your eye doctor yearly to check for diabetic eye damage. 9)  Check your feet each night for sore areas, calluses or signs of infection. 10)  Check your Blood Pressure regularly. If it is above: you should make an appointment. 11)  Take 650-1000mg  of Tylenol every 4-6 hours as needed for relief of pain or comfort of fever AVOID taking more than 4000mg   in a 24 hour period (can cause liver damage in higher doses). Prescriptions: SYNTHROID 100 MCG TABS (LEVOTHYROXINE SODIUM) one by mouth once daily  #90 x 3   Entered and Authorized by:   [name] MD   Signed by:   [name] MD on 04/04/2010   Method used:   Print then Give to Patient   RxID:   [id number] SYNTHROID 100 MCG TABS (LEVOTHYROXINE SODIUM) one by mouth once daily  #90 x 3   Entered and Authorized by:   [name] MD   Signed  by:   [name] MD on 04/04/2010   Method used:   Print then Give to Patient   RxID:   [id number] METFORMIN HCL 1000 MG TABS (METFORMIN HCL)   #180 x 3   Entered and Authorized by:   [name] MD   Signed by:   [name] MD on 04/04/2010   Method used:   Print then Give to Patient   RxID:   [id number] LIVALO 4 MG TABS (PITAVASTATIN CALCIUM) One by mouth once daily for cholesterol  #70 x 0   Entered and Authorized by:   [name] MD   Signed by:   [name] MD on 04/04/2010   Method used:   Print then Give to Patient   RxID:   [id number]

## 2010-09-13 NOTE — Letter (Signed)
Summary: Results Follow-up Letter  [facility]  [address] [place], [place] [address]   Phone: [phone]  Fax: [phone]    04/04/2010  [address] [place], [place]  [address]  Dear Ms. Gowdy,   The following are the results of your recent test(s):  Test     Result     Blood sugars   way too high Thyroid dose    too low CBC       normal Liver/kidney   normal Urine       normal   _________________________________________________________  Please call for an appointment soon _________________________________________________________ _________________________________________________________ _________________________________________________________  Sincerely,  Sanda Linger MD [facility]

## 2010-09-13 NOTE — Letter (Signed)
Summary: Lipid Letter  [facility]  [address] [place], [place] [address]   Phone: [phone]  Fax: [phone]    04/04/2010  [name] [name] [address] [place], [place]  [address]  Dear Ms. Shedrick:  We have carefully reviewed your last lipid profile from 04/04/2010 and the results are noted below with a summary of recommendations for lipid management.    Cholesterol:       149     Goal: <200   HDL "good" Cholesterol:   57.84     Goal: >50   LDL "bad" Cholesterol:   80     Goal: <100   Triglycerides:       76.0     Goal: <150        TLC Diet (Therapeutic Lifestyle Change): Saturated Fats & Transfatty acids should be kept < 7% of total calories ***Reduce Saturated Fats Polyunstaurated Fat can be up to 10% of total calories Monounsaturated Fat Fat can be up to 20% of total calories Total Fat should be no greater than 25-35% of total calories Carbohydrates should be 50-60% of total calories Protein should be approximately 15% of total calories Fiber should be at least 20-30 grams a day ***Increased fiber may help lower LDL Total Cholesterol should be < 200mg /day Consider adding plant stanol/sterols to diet (example: Benacol spread) ***A higher intake of unsaturated fat may reduce Triglycerides and Increase HDL    Adjunctive Measures (may lower LIPIDS and reduce risk of Heart Attack) include: Aerobic Exercise (20-30 minutes 3-4 times a week) Limit Alcohol Consumption Weight Reduction Aspirin 75-81 mg a day by mouth (if not allergic or contraindicated) Dietary Fiber 20-30 grams a day by mouth     Current Medications: 1)    Metformin Hcl 1000 Mg Tabs (Metformin hcl) 2)    Amaryl 4 Mg Tabs (Glimepiride) 3)    Actos 45 Mg Tabs (Pioglitazone hcl) .Marland Kitchen.. 1 by mouth once daily 4)    Synthroid 88 Mcg Tabs (Levothyroxine sodium) .Marland Kitchen.. 1 by mouth once daily 5)    Vitamin D (ergocalciferol) 50000 Unit Caps (Ergocalciferol) .Marland Kitchen.. 1 by mouth once weekly. 6)     Livalo 4 Mg Tabs (Pitavastatin calcium) .... One by mouth once daily for cholesterol  If you have any questions, please call. We appreciate being able to work with you.   Sincerely,    [facility] [name] MD

## 2010-09-13 NOTE — Progress Notes (Signed)
Summary: Phone-med  Phone Note Call from Patient Call back at Home Phone [phone]   Caller: Patient Summary of Call: Patient states that everytime she takes augmentine 875mg . she vomits. She would like for Dr. Laury Axon to prescribe her another med. Initial call taken by: Barb Merino,  August 21, 2009 1:06 PM  Follow-up for Phone Call        ceftin 500 mg two times a day for 10 days  Follow-up by: Loreen Freud DO,  August 21, 2009 4:50 PM  Additional Follow-up for Phone Call Additional follow up Details #1::        pt is aware. Army Fossa CMA  August 21, 2009 4:59 PM    New/Updated Medications: CEFTIN 500 MG TABS (CEFUROXIME AXETIL) 1 by mouth two times a day Prescriptions: CEFTIN 500 MG TABS (CEFUROXIME AXETIL) 1 by mouth two times a day  #20 x 0   Entered by:   Army Fossa CMA   Authorized by:   Loreen Freud DO   Signed by:   Army Fossa CMA on 08/21/2009   Method used:   Electronically to        [facility] [address] (retail)       [phone]       [place], [place]  [address]       Ph: [phone]       Fax: [phone]   RxID:   [id number] CEFTIN 500 MG TABS (CEFUROXIME AXETIL) 1 by mouth two times a day  #20 x 0   Entered and Authorized by:   Loreen Freud DO   Signed by:   Loreen Freud DO on 08/21/2009   Method used:   Historical   RxID:   [id number]

## 2010-09-13 NOTE — Progress Notes (Signed)
Summary: refill  Phone Note Refill Request Message from:  Fax from Pharmacy on [facility] [phone] fax [phone]  Refills Requested: Medication #1:  ULTRAM 50 MG TABS 1-2 by mouth every 6 hours as needed. Temporaily out of stock . Patient wants to fill with ultracet may we change? If so do you want same sig and qty?   Initial call taken by: Barb Merino,  August 18, 2009 2:30 PM  Follow-up for Phone Call        already sent. Army Fossa CMA  August 18, 2009 2:32 PM

## 2010-09-13 NOTE — Medication Information (Signed)
Summary: Livalo / [facility]  Livalo / [facility]   Imported By: Lennie Odor 04/24/2010 16:13:26  _____________________________________________________________________  External Attachment:    Type:   Image     Comment:   External Document

## 2010-09-13 NOTE — Progress Notes (Signed)
Summary: PT AT PHARMACY NOW NEEDS MED  Phone Note Call from Patient Call back at [phone] [facility]   Caller: Patient Call For: Loreen Freud DO Summary of Call: PATIENT CALLING FROM [facility].  SHE IS THERE TO PICK-UP RX FOR TRAMADOL & THEY ARE OUT OF IT.  PATIENT IS WAITING AT PHARMACY NOW & WANTS [place] TO CALL THEM BACK & HAVE THEM FILL ULTRACET AS AN ALTERNATIVE UNTIL THEY GET MORE TRAMADOL.  PLEASE CALL PHARMACY NOW PER PT.  Drue Novel - [phone]. Initial call taken by: Magdalen Spatz [facility],  August 18, 2009 2:08 PM  Follow-up for Phone Call        PER DR LOWNE OKAY TO CHANGE. Army Fossa CMA  August 18, 2009 2:29 PM    New/Updated Medications: ULTRACET 37.5-325 MG TABS (TRAMADOL-ACETAMINOPHEN) 1-2 by mouth every 6 hours. DO NOT TAKE TYLENOL WITH THIS MEDICATION. Prescriptions: ULTRACET 37.5-325 MG TABS (TRAMADOL-ACETAMINOPHEN) 1-2 by mouth every 6 hours. DO NOT TAKE TYLENOL WITH THIS MEDICATION.  #60 x 0   Entered by:   Army Fossa CMA   Authorized by:   Loreen Freud DO   Signed by:   Army Fossa CMA on 08/18/2009   Method used:   Faxed to ...       [facility] [address] (retail)       [phone]       [place], [place]  [address]       Ph: [phone]       Fax: [phone]   RxID:   [id number]

## 2010-09-13 NOTE — Assessment & Plan Note (Signed)
Summary: FU ON LABS/NWS  #   Vital Signs:  Patient profile:   77 year old female Height:      63 inches Weight:      143 pounds BMI:     25.42 O2 Sat:      97 % on Room air Temp:     98.8 degrees F oral Pulse rate:   78 / minute Pulse rhythm:   regular Resp:     16 per minute BP sitting:   138 / 68  (left arm) Cuff size:   large  Vitals Entered By: Rock Nephew CMA (April 11, 2010 1:52 PM)  Nutrition Counseling: Patient's BMI is greater than 25 and therefore counseled on weight management options.  O2 Flow:  Room air CC: follow-up visit// discuss lab results and refills Is Patient Diabetic? Yes Did you bring your meter with you today? No Pain Assessment Patient in pain? no       Does patient need assistance? Functional Status Self care Ambulation Normal   Primary Care Provider:  [name] MD  CC:  follow-up visit// discuss lab results and refills.  History of Present Illness:  Follow-Up Visit      This is a 77 year old woman who presents for Follow-up visit.  The patient complains of high blood sugar symptoms, but denies chest pain, palpitations, dizziness, syncope, low blood sugar symptoms, edema, SOB, DOE, PND, and orthopnea.  Since the last visit the patient notes no new problems or concerns.  The patient reports taking meds as prescribed, monitoring BP, monitoring blood sugars, and dietary noncompliance.  When questioned about possible medication side effects, the patient notes none.    Preventive Screening-Counseling & Management  Alcohol-Tobacco     Alcohol drinks/day: 0     Smoking Status: never     Tobacco Counseling: not indicated; no tobacco use  Hep-HIV-STD-Contraception     Hepatitis Risk: no risk noted     HIV Risk: no risk noted     STD Risk: no risk noted      Drug Use:  no.        Blood Transfusions:  no.    Clinical Review Panels:  Lipid Management   Cholesterol:  149 (04/04/2010)   LDL (bad choesterol):  80 (04/04/2010)  HDL (good cholesterol):  53.50 (04/04/2010)  Diabetes Management   HgBA1C:  9.4 (04/04/2010)   Creatinine:  0.8 (04/04/2010)   Last Foot Exam:  yes (04/11/2010)   Last Flu Vaccine:  Fluvax 3+ (06/21/2009)  CBC   WBC:  6.1 (04/04/2010)   RBC:  4.51 (04/04/2010)   Hgb:  12.5 (04/04/2010)   Hct:  37.7 (04/04/2010)   Platelets:  261.0 (04/04/2010)   MCV  83.5 (04/04/2010)   MCHC  33.1 (04/04/2010)   RDW  16.5 (04/04/2010)   PMN:  46.1 (04/04/2010)   Lymphs:  47.2 (04/04/2010)   Monos:  4.9 (04/04/2010)   Eosinophils:  1.2 (04/04/2010)   Basophil:  0.6 (04/04/2010)  Complete Metabolic Panel   Glucose:  159 (04/04/2010)   Sodium:  140 (04/04/2010)   Potassium:  4.3 (04/04/2010)   Chloride:  103 (04/04/2010)   CO2:  26 (04/04/2010)   BUN:  13 (04/04/2010)   Creatinine:  0.8 (04/04/2010)   Albumin:  4.2 (04/04/2010)   Total Protein:  8.1 (04/04/2010)   Calcium:  10.1 (04/04/2010)   Total Bili:  0.5 (04/04/2010)   Alk Phos:  62 (04/04/2010)   SGPT (ALT):  13 (04/04/2010)   SGOT (  AST):  20 (04/04/2010)   Medications Prior to Update: 1)  Metformin Hcl 1000 Mg Tabs (Metformin Hcl) 2)  Amaryl 4 Mg Tabs (Glimepiride) 3)  Actos 45 Mg Tabs (Pioglitazone Hcl) .Marland Kitchen.. 1 By Mouth Once Daily 4)  Synthroid 100 Mcg Tabs (Levothyroxine Sodium) .... One By Mouth Once Daily 5)  Vitamin D (Ergocalciferol) 50000 Unit Caps (Ergocalciferol) .Marland Kitchen.. 1 By Mouth Once Weekly. 6)  Livalo 4 Mg Tabs (Pitavastatin Calcium) .... One By Mouth Once Daily For Cholesterol  Current Medications (verified): 1)  Metformin Hcl 1000 Mg Tabs (Metformin Hcl) .... One By Mouth Two Times A Day 2)  Amaryl 4 Mg Tabs (Glimepiride) 3)  Actos 45 Mg Tabs (Pioglitazone Hcl) .Marland Kitchen.. 1 By Mouth Once Daily 4)  Synthroid 100 Mcg Tabs (Levothyroxine Sodium) .... One By Mouth Once Daily 5)  Vitamin D (Ergocalciferol) 50000 Unit Caps (Ergocalciferol) .Marland Kitchen.. 1 By Mouth Once Weekly. 6)  Livalo 4 Mg Tabs (Pitavastatin Calcium) .... One By  Mouth Once Daily For Cholesterol 7)  Hydrocodone-Acetaminophen 7.5-500 Mg Tabs (Hydrocodone-Acetaminophen) .... One By Mouth Two Times A Day As Needed For Pain  Allergies (verified): No Known Drug Allergies  Past History:  Past Medical History: Last updated: 04/04/2010 Hypothyroidism High Cholesterol Osteoarthritis Diabetes mellitus, type II  Past Surgical History: Last updated: 01/14/2007 Hysterectomy-1980 Colonoscopy-2006 BMD-2006  Family History: Last updated: 01/14/2007 Family History Diabetes 1st degree relative Family History Hypertension Family History Uterine cancer  Social History: Last updated: 04/04/2010 Occupation: Retired Engineer, civil (consulting) Alcohol use-no Divorced Never Smoked Drug use-no Regular exercise-no  Risk Factors: Alcohol Use: 0 (04/11/2010) Exercise: no (04/04/2010)  Risk Factors: Smoking Status: never (04/11/2010)  Family History: Reviewed history from 01/14/2007 and no changes required. Family History Diabetes 1st degree relative Family History Hypertension Family History Uterine cancer  Social History: Reviewed history from 04/04/2010 and no changes required. Occupation: Retired Engineer, civil (consulting) Alcohol use-no Divorced Never Smoked Drug use-no Regular exercise-no  Review of Systems       The patient complains of weight gain.  The patient denies anorexia, fever, weight loss, chest pain, syncope, dyspnea on exertion, peripheral edema, prolonged cough, headaches, hemoptysis, abdominal pain, hematuria, difficulty walking, depression, and angioedema.   MS:  Complains of joint pain; denies joint redness, joint swelling, loss of strength, low back pain, mid back pain, muscle aches, muscle, cramps, muscle weakness, stiffness, and thoracic pain. Endo:  Complains of weight change; denies cold intolerance, excessive hunger, excessive thirst, excessive urination, heat intolerance, and polyuria.  Physical Exam  General:  alert, well-developed, well-nourished,  well-hydrated, appropriate dress, normal appearance, healthy-appearing, cooperative to examination, and good hygiene.   Mouth:  Oral mucosa and oropharynx without lesions or exudates.  Teeth in good repair. Neck:  supple, full ROM, no masses, no thyromegaly, no JVD, normal carotid upstroke, no carotid bruits, no cervical lymphadenopathy, and no neck tenderness.   Lungs:  normal respiratory effort, no intercostal retractions, no accessory muscle use, normal breath sounds, no dullness, no fremitus, no crackles, and no wheezes.   Heart:  normal rate, regular rhythm, no murmur, no gallop, no rub, and no JVD.   Abdomen:  soft, non-tender, normal bowel sounds, no distention, no masses, no guarding, no rigidity, no rebound tenderness, no abdominal hernia, no inguinal hernia, no hepatomegaly, and no splenomegaly.   Msk:  normal ROM, no joint tenderness, no joint swelling, no joint warmth, no redness over joints, no joint deformities, no joint instability, and no crepitation.   Pulses:  R and L carotid,radial,femoral,dorsalis pedis and posterior tibial  pulses are full and equal bilaterally Extremities:  No clubbing, cyanosis, edema, or deformity noted with normal full range of motion of all joints.   Neurologic:  alert & oriented X3, cranial nerves II-XII intact, strength normal in all extremities, sensation intact to light touch, sensation intact to pinprick, gait normal, and DTRs symmetrical and normal.   Skin:  turgor normal, color normal, no rashes, no suspicious lesions, no ecchymoses, no petechiae, no purpura, no ulcerations, and no edema.   Cervical Nodes:  no anterior cervical adenopathy and no posterior cervical adenopathy.   Psych:  Oriented X3, memory intact for recent and remote, normally interactive, good eye contact, not anxious appearing, not depressed appearing, not agitated, not suicidal, not homicidal, easily distracted, and poor concentration.    Diabetes Management Exam:    Foot Exam (with  socks and/or shoes not present):       Sensory-Pinprick/Light touch:          Left medial foot (L-4): normal          Left dorsal foot (L-5): normal          Left lateral foot (S-1): normal          Right medial foot (L-4): normal          Right dorsal foot (L-5): normal          Right lateral foot (S-1): normal       Sensory-Monofilament:          Left foot: normal          Right foot: normal       Inspection:          Left foot: normal          Right foot: normal       Nails:          Left foot: normal          Right foot: normal   Impression & Recommendations:  Problem # 1:  DEGENERATIVE JOINT DISEASE (ICD-715.90) Assessment Deteriorated  Her updated medication list for this problem includes:    Hydrocodone-acetaminophen 7.5-500 Mg Tabs (Hydrocodone-acetaminophen) ..... One by mouth two times a day as needed for pain  Problem # 2:  DIABETES MELLITUS, TYPE II (ICD-250.00) Assessment: Deteriorated she refuses to start insulin, a new oral med, or to augment doses of her current meds and she states that she will lower her blood sugars thru lifestyle modifications Her updated medication list for this problem includes:    Metformin Hcl 1000 Mg Tabs (Metformin hcl) ..... One by mouth two times a day    Amaryl 4 Mg Tabs (Glimepiride)    Actos 45 Mg Tabs (Pioglitazone hcl) .Marland Kitchen... 1 by mouth once daily  Problem # 3:  HYPERLIPIDEMIA, MIXED (ICD-272.2) Assessment: Improved  Her updated medication list for this problem includes:    Livalo 4 Mg Tabs (Pitavastatin calcium) ..... One by mouth once daily for cholesterol  Labs Reviewed: SGOT: 20 (04/04/2010)   SGPT: 13 (04/04/2010)   HDL:53.50 (04/04/2010), 43.30 (08/14/2009)  LDL:80 (04/04/2010), 47 (08/14/2009)  Chol:149 (04/04/2010), 113 (08/14/2009)  Trig:76.0 (04/04/2010), 116.0 (08/14/2009)  Problem # 4:  HYPOTHYROIDISM (ICD-244.9) Assessment: Improved  Her updated medication list for this problem includes:    Synthroid 100 Mcg  Tabs (Levothyroxine sodium) ..... One by mouth once daily  Complete Medication List: 1)  Metformin Hcl 1000 Mg Tabs (Metformin hcl) .... One by mouth two times a day 2)  Amaryl 4 Mg Tabs (Glimepiride) 3)  Actos 45 Mg Tabs (Pioglitazone hcl) .Marland Kitchen.. 1 by mouth once daily 4)  Synthroid 100 Mcg Tabs (Levothyroxine sodium) .... One by mouth once daily 5)  Vitamin D (ergocalciferol) 50000 Unit Caps (Ergocalciferol) .Marland Kitchen.. 1 by mouth once weekly. 6)  Livalo 4 Mg Tabs (Pitavastatin calcium) .... One by mouth once daily for cholesterol 7)  Hydrocodone-acetaminophen 7.5-500 Mg Tabs (Hydrocodone-acetaminophen) .... One by mouth two times a day as needed for pain  Patient Instructions: 1)  Please schedule a follow-up appointment in 2 months. 2)  It is important that you exercise regularly at least 20 minutes 5 times a week. If you develop chest pain, have severe difficulty breathing, or feel very tired , stop exercising immediately and seek medical attention. 3)  You need to lose weight. Consider a lower calorie diet and regular exercise.  4)  Check your blood sugars regularly. If your readings are usually above 200 or below 70 you should contact our office. 5)  It is important that your Diabetic A1c level is checked every 3 months. 6)  See your eye doctor yearly to check for diabetic eye damage. 7)  Check your feet each night for sore areas, calluses or signs of infection. 8)  Check your Blood Pressure regularly. If it is above: you should make an appointment. Prescriptions: HYDROCODONE-ACETAMINOPHEN 7.5-500 MG TABS (HYDROCODONE-ACETAMINOPHEN) One by mouth two times a day as needed for pain  #50 x 4   Entered and Authorized by:   [name] MD   Signed by:   [name] MD on 04/11/2010   Method used:   Print then Give to Patient   RxID:   [id number] LIVALO 4 MG TABS (PITAVASTATIN CALCIUM) One by mouth once daily for cholesterol  #90 x 3   Entered and Authorized by:   [name] MD    Signed by:   [name] MD on 04/11/2010   Method used:   Print then Give to Patient   RxID:   [id number] VITAMIN D (ERGOCALCIFEROL) 50000 UNIT CAPS (ERGOCALCIFEROL) 1 by mouth once weekly.  #12 x 3   Entered and Authorized by:   [name] MD   Signed by:   [name] MD on 04/11/2010   Method used:   Print then Give to Patient   RxID:   [id number] SYNTHROID 100 MCG TABS (LEVOTHYROXINE SODIUM) one by mouth once daily  #90 x 3   Entered and Authorized by:   [name] MD   Signed by:   [name] MD on 04/11/2010   Method used:   Print then Give to Patient   RxID:   [id number] ACTOS 45 MG TABS (PIOGLITAZONE HCL) 1 by mouth once daily  #90 x 3   Entered and Authorized by:   [name] MD   Signed by:   [name] MD on 04/11/2010   Method used:   Print then Give to Patient   RxID:   [id number] AMARYL 4 MG TABS (GLIMEPIRIDE)   #90 x 3   Entered and Authorized by:   [name] MD   Signed by:   [name] MD on 04/11/2010   Method used:   Print then Give to Patient   RxID:   [id number] METFORMIN HCL 1000 MG TABS (METFORMIN HCL) one by mouth two times a day  #180 x 3   Entered and Authorized by:   [name] MD   Signed by:  [name] MD on 04/11/2010   Method used:   Print then Give to Patient   RxID:   [id number]

## 2010-09-13 NOTE — Assessment & Plan Note (Signed)
Summary: flu shot/jones/cd   Nurse Visit   Allergies: No Known Drug Allergies  Orders Added: 1)  Flu Vaccine 3yrs + MEDICARE PATIENTS [Q2039] 2)  Administration Flu vaccine - MCR [G0008] Flu Vaccine Consent Questions     Do you have a history of severe allergic reactions to this vaccine? no    Any prior history of allergic reactions to egg and/or gelatin? no    Do you have a sensitivity to the preservative Thimersol? no    Do you have a past history of Guillan-Barre Syndrome? no    Do you currently have an acute febrile illness? no    Have you ever had a severe reaction to latex? no    Vaccine information given and explained to patient? yes    Are you currently pregnant? no    Lot Number:[id number]   Exp Date:02/09/2011   Site Given  Left Deltoid [email or web address]

## 2010-09-13 NOTE — Progress Notes (Signed)
       New/Updated Medications: AMARYL 4 MG TABS (GLIMEPIRIDE) One by mouth once daily for diabetes Prescriptions: AMARYL 4 MG TABS (GLIMEPIRIDE) One by mouth once daily for diabetes  #90 x 3   Entered and Authorized by:   [name] MD   Signed by:   [name] MD on 04/19/2010   Method used:   Print then Give to Patient   RxID:   [id number]

## 2010-09-13 NOTE — Assessment & Plan Note (Signed)
Summary: TO GO OVER HER MED//PH   Vital Signs:  Patient profile:   77 year old female Weight:      147 pounds Temp:     98.4 degrees F oral Pulse rate:   82 / minute Pulse rhythm:   regular BP sitting:   130 / 86  (left arm) Cuff size:   regular  Vitals Entered By: Army Fossa CMA (August 17, 2009 4:27 PM) CC: Pt c/o head congestion, coughing, pressure in her face. Go over meds , URI symptoms   History of Present Illness:       This is a 77 year old woman who presents with URI symptoms.  The symptoms began 4 days ago.  The patient complains of nasal congestion, clear nasal discharge, sore throat, and productive cough.  The patient denies fever, low-grade fever (<100.5 degrees), fever of 100.5-103 degrees, fever of 103.1-104 degrees, fever to >104 degrees, stiff neck, dyspnea, wheezing, rash, vomiting, diarrhea, use of an antipyretic, and response to antipyretic.  The patient also reports headache and muscle aches.         The patient denies the following risk factors for Strep sinusitis: unilateral facial pain, unilateral nasal discharge, poor response to decongestant, double sickening, tooth pain, Strep exposure, tender adenopathy, and absence of cough.  + b/l sinus pressure.     Type 1 diabetes mellitus follow-up      The patient is also here for Type 2 diabetes mellitus follow-up.  The patient denies polyuria, polydipsia, blurred vision, self managed hypoglycemia, hypoglycemia requiring help, weight loss, weight gain, and numbness of extremities.  The patient denies the following symptoms:  chest pain, vomiting, orthostatic symptoms, poor wound healing, intermittent claudication, vision loss, and foot ulcer.  Since the last visit the patient reports good dietary compliance, compliance with medications, and monitoring blood glucose.  The patient has been measuring capillary blood glucose before breakfast and at bedtime.  Since the last visit, the patient reports having had eye care by  an ophthalmologist.  + nerve pain-- per pt --all  over her body  Current Medications (verified): 1)  Metformin Hcl 1000 Mg Tabs (Metformin Hcl) 2)  Amaryl 4 Mg Tabs (Glimepiride) 3)  Actos 45 Mg Tabs (Pioglitazone Hcl) .Marland Kitchen.. 1 By Mouth Once Daily 4)  Vytorin 10-40 Mg Tabs (Ezetimibe-Simvastatin) .Marland Kitchen.. 1 By Mouth Once Daily 5)  Synthroid 88 Mcg Tabs (Levothyroxine Sodium) .Marland Kitchen.. 1 By Mouth Once Daily 6)  Vitamin D (Ergocalciferol) 50000 Unit Caps (Ergocalciferol) .Marland Kitchen.. 1 By Mouth Once Weekly. 7)  Augmentin 875-125 Mg Tabs (Amoxicillin-Pot Clavulanate) .Marland Kitchen.. 1 By Mouth Two Times A Day 8)  Ultram 50 Mg Tabs (Tramadol Hcl) .Marland Kitchen.. 1-2 By Mouth Every 6 Hours As Needed  Allergies (verified): No Known Drug Allergies  Past History:  Past medical, surgical, family and social histories (including risk factors) reviewed for relevance to current acute and chronic problems.  Past Medical History: Reviewed history from 01/19/2007 and no changes required. Hypothyroidism High Cholesterol Diabetes mellitus, type I  Past Surgical History: Reviewed history from 01/14/2007 and no changes required. Hysterectomy-1980 Colonoscopy-2006 BMD-2006  Family History: Reviewed history from 01/14/2007 and no changes required. Family History Diabetes 1st degree relative Family History Hypertension Family History Uterine cancer  Social History: Reviewed history from 01/14/2007 and no changes required. 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 Lungs:  Normal respiratory effort, chest expands symmetrically. Lungs are clear to auscultation, no crackles or wheezes. Heart:  normal rate and no murmur.   Extremities:  No clubbing, cyanosis, edema, or deformity noted with normal full range of motion of  all joints.   Skin:  Intact without suspicious lesions or rashes Cervical Nodes:  No lymphadenopathy noted Psych:  Cognition and judgment appear intact. Alert and cooperative with normal attention span and concentration. No apparent delusions, illusions, hallucinations   Impression & Recommendations:  Problem # 1:  DIABETIC PERIPHERAL NEUROPATHY (ICD-250.60)  The following medications were removed from the medication list:    Metformin Hcl 1000 Mg Tabs (Metformin hcl)    Amaryl 4 Mg Tabs (Glimepiride)    Actos 30 Mg Tabs (Pioglitazone hcl) .Marland Kitchen... Take 1 tablet by mouth once a day Her updated medication list for this problem includes:    Metformin Hcl 1000 Mg Tabs (Metformin hcl)    Amaryl 4 Mg Tabs (Glimepiride)    Actos 45 Mg Tabs (Pioglitazone hcl) .Marland Kitchen... 1 by mouth once daily  Labs Reviewed: Creat: 0.8 (08/14/2009)    Reviewed HgBA1c results: 8.4 (08/14/2009)  8.5 (01/06/2007)  Problem # 2:  HYPERLIPIDEMIA, MIXED (ICD-272.2)  Her updated medication list for this problem includes:    Vytorin 10-40 Mg Tabs (Ezetimibe-simvastatin) .Marland Kitchen... 1 by mouth once daily  Labs Reviewed: SGOT: 21 (08/14/2009)   SGPT: 12 (08/14/2009)   HDL:43.30 (08/14/2009), 32.7 (01/06/2007)  LDL:47 (08/14/2009), DEL (04/54/0981)  Chol:113 (08/14/2009), 258 (01/06/2007)  Trig:116.0 (08/14/2009), 245 (01/06/2007)  Problem # 3:  DM (ICD-250.00) pt sees endo The following medications were removed from the medication list:    Metformin Hcl 1000 Mg Tabs (Metformin hcl)    Amaryl 4 Mg Tabs (Glimepiride)    Actos 30 Mg Tabs (Pioglitazone hcl) .Marland Kitchen... Take 1 tablet by mouth once a day Her updated medication list for this problem includes:    Metformin Hcl 1000 Mg Tabs (Metformin hcl)    Amaryl 4 Mg Tabs (Glimepiride)    Actos 45 Mg Tabs (Pioglitazone hcl) .Marland Kitchen... 1 by mouth once daily  Labs Reviewed: Creat: 0.8 (08/14/2009)    Reviewed HgBA1c results: 8.4 (08/14/2009)  8.5 (01/06/2007)  Problem # 4:   HYPOTHYROIDISM (ICD-244.9)  Her updated medication list for this problem includes:    Synthroid 88 Mcg Tabs (Levothyroxine sodium) .Marland Kitchen... 1 by mouth once daily  Labs Reviewed: TSH: 2.94 (08/14/2009)    HgBA1c: 8.4 (08/14/2009) Chol: 113 (08/14/2009)   HDL: 43.30 (08/14/2009)   LDL: 47 (08/14/2009)   TG: 116.0 (08/14/2009)  Problem # 5:  SINUSITIS - ACUTE-NOS (ICD-461.9)  Her updated medication list for this problem includes:    Augmentin 875-125 Mg Tabs (Amoxicillin-pot clavulanate) .Marland Kitchen... 1 by mouth two times a day  Instructed on treatment. Call if symptoms persist or worsen.   Complete Medication List: 1)  Metformin Hcl 1000 Mg Tabs (Metformin hcl) 2)  Amaryl 4 Mg Tabs (Glimepiride) 3)  Actos 45 Mg Tabs (Pioglitazone hcl) .Marland Kitchen.. 1 by mouth once daily 4)  Vytorin 10-40 Mg Tabs (Ezetimibe-simvastatin) .Marland Kitchen.. 1 by mouth once daily 5)  Synthroid 88 Mcg Tabs (Levothyroxine sodium) .Marland Kitchen.. 1 by mouth once daily 6)  Vitamin D (ergocalciferol) 50000 Unit Caps (Ergocalciferol) .Marland Kitchen.. 1 by mouth once weekly. 7)  Augmentin 875-125 Mg Tabs (Amoxicillin-pot clavulanate) .Marland Kitchen.. 1 by mouth two times a day 8)  Ultram 50 Mg Tabs (Tramadol hcl) .Marland Kitchen.. 1-2 by mouth every 6 hours as needed Prescriptions: ULTRAM 50  MG TABS (TRAMADOL HCL) 1-2 by mouth every 6 hours as needed  #60 x 0   Entered and Authorized by:   Loreen Freud DO   Signed by:   Loreen Freud DO on 08/17/2009   Method used:   Electronically to        [facility] [address] (retail)       [phone]       [place], [place]  [address]       Ph: [phone]       Fax: [phone]   RxID:   [id number] AUGMENTIN 875-125 MG TABS (AMOXICILLIN-POT CLAVULANATE) 1 by mouth two times a day  #20 x 0   Entered and Authorized by:   Loreen Freud DO   Signed by:   Loreen Freud DO on 08/17/2009   Method used:   Electronically to        [facility] [address] (retail)       [phone]        [place], [place]  [address]       Ph: [phone]       Fax: [phone]   RxID:   [id number]

## 2010-09-13 NOTE — Progress Notes (Signed)
Summary: Prior Auth--Actos in process  Phone Note Other Incoming   Caller: [facility] Summary of Call: Received fax requesting prior authorization for Actos 45mg . fax [phone].  Form forwarded to Provider for completion. Nicki Guadalajara Fergerson CMA Duncan Dull)  April 20, 2010 11:10 AM   Follow-up for Phone Call        Form completed and faxed to [phone]. Nicki Guadalajara Fergerson CMA Duncan Dull)  April 20, 2010 2:33 PM

## 2010-10-26 ENCOUNTER — Telehealth: Payer: Self-pay | Admitting: Internal Medicine

## 2010-11-05 ENCOUNTER — Telehealth: Payer: Self-pay

## 2010-11-05 MED ORDER — LEVOTHYROXINE SODIUM 100 MCG PO TABS: 100.0000 ug | ORAL_TABLET | Freq: Every day | ORAL | Status: DC

## 2010-11-05 NOTE — Telephone Encounter (Signed)
Change to generic

## 2010-11-05 NOTE — Telephone Encounter (Signed)
Received [facility] from [facility] stating that synthroid 0.1mg  is not covered w/o a prior authorization. Would you like to proceed with prior authorization or switch to the generic levothyroxine 0.1mg  once daily. Please advise Thanks

## 2010-11-05 NOTE — Telephone Encounter (Signed)
Rx switched to generic per MD and [facility]ed [facility] [phone]

## 2010-11-08 NOTE — Progress Notes (Signed)
Summary: Synthroid  Phone Note Call from Patient Call back at Home Phone [phone]   Summary of Call: Patient is requesting brand name synthroid [facility]. Also wants 2 more boxes of test strips. Ok for brand name rx to go to mail order?  Initial call taken by: Lamar Sprinkles, CMA,  October 26, 2010 12:33 PM  Follow-up for Phone Call        yes Follow-up by: [name] MD,  October 26, 2010 12:40 PM  Additional Follow-up for Phone Call Additional follow up Details #1::        left mess to call office back................Marland KitchenLamar Sprinkles, CMA  October 26, 2010 12:47 PM   Left vm for pt to check w/her pharm  Additional Follow-up by: Lamar Sprinkles, CMA,  October 29, 2010 4:18 PM    New/Updated Medications: SYNTHROID 100 MCG TABS (LEVOTHYROXINE SODIUM) one by mouth once daily BRAND MEDiCALLY NECESSARY [BMN] Prescriptions: SYNTHROID 100 MCG TABS (LEVOTHYROXINE SODIUM) one by mouth once daily BRAND MEDiCALLY NECESSARY Brand medically necessary #90 x 3   Entered by:   Lamar Sprinkles, CMA   Authorized by:   [name] MD   Signed by:   Lamar Sprinkles, CMA on 10/26/2010   Method used:   Faxed to ...       [facility] [phone] (retail)       [address] [address]       [place], [place]  [id number]       Ph: [phone]       Fax: [phone]   RxID:   [id number]

## 2010-11-29 ENCOUNTER — Other Ambulatory Visit (INDEPENDENT_AMBULATORY_CARE_PROVIDER_SITE_OTHER): Payer: Medicare Other

## 2010-11-29 ENCOUNTER — Encounter: Payer: Self-pay | Admitting: Internal Medicine

## 2010-11-29 ENCOUNTER — Ambulatory Visit (INDEPENDENT_AMBULATORY_CARE_PROVIDER_SITE_OTHER): Payer: Medicare Other | Admitting: Internal Medicine

## 2010-11-29 VITALS — BP 138/70 | HR 64 | Temp 98.5°F | Resp 16 | Wt 139.0 lb

## 2010-11-29 DIAGNOSIS — E039 Hypothyroidism, unspecified: Secondary | ICD-10-CM

## 2010-11-29 DIAGNOSIS — Z23 Encounter for immunization: Secondary | ICD-10-CM

## 2010-11-29 DIAGNOSIS — E119 Type 2 diabetes mellitus without complications: Secondary | ICD-10-CM

## 2010-11-29 DIAGNOSIS — L299 Pruritus, unspecified: Secondary | ICD-10-CM

## 2010-11-29 DIAGNOSIS — G250 Essential tremor: Secondary | ICD-10-CM

## 2010-11-29 DIAGNOSIS — G252 Other specified forms of tremor: Secondary | ICD-10-CM

## 2010-11-29 DIAGNOSIS — E782 Mixed hyperlipidemia: Secondary | ICD-10-CM

## 2010-11-29 LAB — CBC WITH DIFFERENTIAL/PLATELET
Basophils Absolute: 0.1 10*3/uL (ref 0.0–0.1)
Eosinophils Absolute: 0 10*3/uL (ref 0.0–0.7)
Lymphocytes Relative: 48.3 % — ABNORMAL HIGH (ref 12.0–46.0)
MCHC: 33 g/dL (ref 30.0–36.0)
Monocytes Absolute: 0.4 10*3/uL (ref 0.1–1.0)
Neutro Abs: 3 10*3/uL (ref 1.4–7.7)
Neutrophils Relative %: 44.3 % (ref 43.0–77.0)
RDW: 16.3 % — ABNORMAL HIGH (ref 11.5–14.6)

## 2010-11-29 LAB — LIPID PANEL
HDL: 45.1 mg/dL (ref >39.00)
Triglycerides: 99 mg/dL (ref 0.0–149.0)

## 2010-11-29 LAB — COMPREHENSIVE METABOLIC PANEL
CO2: 27 mEq/L (ref 19–32)
Calcium: 9.8 mg/dL (ref 8.4–10.5)
Chloride: 104 mEq/L (ref 96–112)
GFR: 105.04 mL/min (ref >60.00)
Glucose, Bld: 91 mg/dL (ref 70–99)
Sodium: 141 mEq/L (ref 135–145)
Total Bilirubin: 0.4 mg/dL (ref 0.3–1.2)
Total Protein: 7.8 g/dL (ref 6.0–8.3)

## 2010-11-29 LAB — TSH: TSH: 7.98 u[IU]/mL — ABNORMAL HIGH (ref 0.35–5.50)

## 2010-11-29 MED ORDER — PITAVASTATIN CALCIUM 4 MG PO TABS: 1.0000 | ORAL_TABLET | Freq: Every day | ORAL | Status: DC

## 2010-11-29 MED ORDER — HYDROXYZINE HCL 10 MG PO TABS: 10.0000 mg | ORAL_TABLET | Freq: Three times a day (TID) | ORAL | Status: AC | PRN

## 2010-11-29 NOTE — Assessment & Plan Note (Signed)
She is doing well on livalo, she has tried many many other statins and did not tolerate any of them due to myopathy, this is the only option for her as a diabetic to reduce her risk of MI and Stroke

## 2010-11-29 NOTE — Assessment & Plan Note (Signed)
No treatment of this at her request

## 2010-11-29 NOTE — Patient Instructions (Signed)
Diabetes, Type 2 Diabetes is a lasting (chronic) disease. In type 2 diabetes, the pancreas does not make enough insulin (a hormone), and the body does not respond normally to the insulin that is made. This type of diabetes was also previously called adult onset diabetes. About 90% of all those who have diabetes have type 2. It usually occurs after the age of 38 but can occur at any age. CAUSES Unlike type 1 diabetes, which happens because insulin is no longer being made, type 2 diabetes happens because the body is making less insulin and has trouble using the insulin properly. SYMPTOMS  Drinking more than usual.   Urinating more than usual.   Blurred vision.   Dry, itchy skin.   Frequent infection like yeast infections in women.   More tired than usual (fatigue).  TREATMENT  Healthy eating.   Exercise.   Medication, if needed.   Monitoring blood glucose (sugar).   Seeing your caregiver regularly.  HOME CARE INSTRUCTIONS  Check your blood glucose (sugar) at least once daily. More frequent monitoring may be necessary, depending on your medications and on how well your diabetes is controlled. Your caregiver will advise you.   Take your medicine as directed by your caregiver.   Do not smoke.   Make wise food choices. Ask your caregiver for information. Weight loss can improve your diabetes.   Learn about low blood glucose (hypoglycemia) and how to treat it.   Get your eyes checked regularly.   Have a yearly physical exam. Have your blood pressure checked. Get your blood and urine tested.   Wear a pendant or bracelet saying that you have diabetes.   Check your feet every night for sores. Let your caregiver know if you have sores that are not healing.  SEEK MEDICAL CARE IF:  You are having problems keeping your blood glucose at target range.   You feel you might be having problems with your medicines.   You have symptoms of an illness that is not improving after 24  hours.   You have a sore or wound that is not healing.   You notice a change in vision or a new problem with your vision.   You develop a fever of more than 100.5.  Document Released: 07/29/2005 Document Re-Released: 08/20/2009 [facility] Patient Information 2011 [place], [place].Itching Itching is a symptom that can be caused by many things. These include skin problems (including infections) as well as some internal diseases.  If the itching is affecting just one area of the body, it is most likely due to a common skin problem, such as:  Poison oak and poison ivy.  Contact dermatitis (skin irritation from a plant, chemicals, fiberglass, detergents, new cosmetic, new jewelry, or other substance).   Fungus (such as athlete's foot, jock itch, or ringworm).   Head lice   Dandruff   Insect bite   Infection (such as Shingles or other virus infections).   If the itching is all over (widespread), the possible causes are many. These include:   Dry skin or eczema   Heat rash   Hives   Liver disorders   Kidney disorders  TREATMENT Localized itching   Lubrication of the skin - Use an ointment or cream such as Vaseline, Eucerin, Nutraderm, Lubriderm, Shepherd's cream, or other unperfumed moisturizers if the skin is dry. Apply frequently, especially after bathing. Sarna lotion can "cool" the skin and reduce itching.   Anti-itch medicines - Benadryl, Zyrtec, and Claritin are over-the-counter medications that  may help control the urge to scratch. Scratching always makes itching worse and increases the chance of getting an infection.   Cortisone creams and ointments - These help reduce the inflammation.   Antibiotics - Skin infections can cause itching. Topical or oral antibiotics may be needed for 10-20 days to get rid of an infection.  If you can identify what caused the itching, avoid this substance in the future.  Widespread itching  The following measures may help to relieve itching  regardless of the cause:   Wash the skin once with soap to remove irritants.   Bathe in tepid water with baking soda, cornstarch, oatmeal or Aveeno.   Use calamine lotion (nonprescription) or a baking soda solution (1 teaspoon in 4 ounces of water on the skin).   Apply 1% hydrocortisone cream (no prescription needed). DO NOT use this if there might be a skin infection.   Avoid scratching.   Avoid itchy or tight-fitting clothes.   Avoid excessive heat, sweating, scented soaps, and swimming pools.   The lubricants, anti-itch medicines, etc. noted above may be helpful for controlling symptoms.  SEEK MEDICAL CARE IF:  The itching becomes severe.   Your itch is not better after 1 week of treatment. Contact your caregiver to schedule further evaluation.   Document Released: 07/29/2005 Document Re-Released: 02/09/2007 [facility] Patient Information 2011 [place], [place].

## 2010-11-29 NOTE — Assessment & Plan Note (Signed)
Continue atarax prn and check labs to look for secondary causes

## 2010-11-29 NOTE — Assessment & Plan Note (Addendum)
Check her TSH level today 

## 2010-11-29 NOTE — Progress Notes (Signed)
Subjective:    Patient ID: [name] [name], female    DOB: 1933-11-22, 77 y.o.   MRN: [id number]  Diabetes She presents for her follow-up diabetic visit. She has type 2 diabetes mellitus. No MedicAlert identification noted. Her disease course has been improving. Hypoglycemia symptoms include tremors (she has a tremor in both hands just like her mother did). Pertinent negatives for hypoglycemia include no confusion, dizziness, headaches, nervousness/anxiousness, pallor, seizures or speech difficulty. Pertinent negatives for diabetes include no blurred vision, no chest pain, no fatigue, no foot paresthesias, no foot ulcerations, no polydipsia, no polyphagia, no polyuria, no visual change, no weakness and no weight loss. There are no hypoglycemic complications. Symptoms are stable. There are no diabetic complications. Risk factors for coronary artery disease include post-menopausal. Current diabetic treatment includes oral agent (dual therapy). Her weight is stable. She is following a generally healthy diet. Meal planning includes avoidance of concentrated sweets. She has not had a previous visit with a dietician. There is no change in her home blood glucose trend. Her breakfast blood glucose range is generally 110-130 mg/dl. Her lunch blood glucose range is generally 110-130 mg/dl. Her highest blood glucose is 110-130 mg/dl. Her overall blood glucose range is 110-130 mg/dl. An ACE inhibitor/angiotensin II receptor blocker is not being taken. She does not see a podiatrist.Eye exam is not current.  Thyroid Problem Presents for follow-up visit. Symptoms include tremors (she has a tremor in both hands just like her mother did). Patient reports no anxiety, cold intolerance, constipation, depressed mood, diaphoresis, diarrhea, dry skin, fatigue, hair loss, heat intolerance, hoarse voice, menstrual problem, nail problem, palpitations, visual change, weight gain or weight loss. The symptoms have been stable.       Review of Systems  Constitutional: Negative for fever, chills, weight loss, weight gain, diaphoresis, activity change, appetite change, fatigue and unexpected weight change.  HENT: Negative for congestion, hoarse voice, facial swelling, rhinorrhea, sneezing, neck pain, neck stiffness, postnasal drip and sinus pressure.   Eyes: Negative for blurred vision, pain, discharge, redness and itching.  Respiratory: Negative for cough, choking, chest tightness, shortness of breath, wheezing and stridor.   Cardiovascular: Negative for chest pain, palpitations and leg swelling.  Gastrointestinal: Negative for nausea, vomiting, abdominal pain, diarrhea, constipation, blood in stool and abdominal distention.  Genitourinary: Negative for dysuria, urgency, polyuria, frequency, hematuria, flank pain, decreased urine volume, enuresis, difficulty urinating, vaginal pain, menstrual problem and dyspareunia.  Musculoskeletal: Negative for myalgias, back pain, joint swelling, arthralgias and gait problem.  Skin: Negative for color change (she has had chronic itching and wants an Rx for atarax), pallor, rash and wound.  Neurological: Positive for tremors (she has a tremor in both hands just like her mother did). Negative for dizziness, seizures, syncope, facial asymmetry, speech difficulty, weakness, light-headedness, numbness and headaches.  Hematological: Negative for cold intolerance, heat intolerance, polydipsia and polyphagia.  Psychiatric/Behavioral: Negative for behavioral problems, confusion, self-injury, dysphoric mood, decreased concentration and agitation. The patient is not nervous/anxious and is not hyperactive.        Objective:   Physical Exam  Constitutional: She is oriented to person, place, and time. She appears well-developed and well-nourished. No distress.  HENT:  Head: Normocephalic and atraumatic.  Right Ear: External ear normal.  Left Ear: External ear normal.  Nose: Nose normal.   Mouth/Throat: Oropharynx is clear and moist. No oropharyngeal exudate.  Eyes: Conjunctivae and EOM are normal. Pupils are equal, round, and reactive to light. Right eye exhibits no discharge. Left eye exhibits  no discharge. No scleral icterus.  Neck: Normal range of motion. Neck supple. No JVD present. No tracheal deviation present. No thyromegaly present.  Cardiovascular: Normal rate, regular rhythm, normal heart sounds and intact distal pulses.  Exam reveals no gallop and no friction rub.   No murmur heard. Pulmonary/Chest: Effort normal and breath sounds normal. No stridor. No respiratory distress. She has no wheezes. She has no rales. She exhibits no tenderness.  Abdominal: Soft. Bowel sounds are normal. She exhibits no distension and no mass. There is no tenderness. There is no rebound and no guarding.  Musculoskeletal: Normal range of motion. She exhibits no edema and no tenderness.  Lymphadenopathy:    She has no cervical adenopathy.  Neurological: She is alert and oriented to person, place, and time. She has normal strength. She displays tremor. She displays no atrophy and normal reflexes. No cranial nerve deficit or sensory deficit. She exhibits normal muscle tone. She displays a negative Romberg sign. Coordination and gait normal.  Reflex Scores:      Tricep reflexes are 1+ on the right side and 1+ on the left side.      Bicep reflexes are 1+ on the right side and 1+ on the left side.      Brachioradialis reflexes are 1+ on the right side and 1+ on the left side.      Patellar reflexes are 1+ on the right side and 1+ on the left side.      Achilles reflexes are 1+ on the right side and 1+ on the left side.      She has a mild intention tremor in both hands, very subtle  Skin: Skin is warm and dry. No rash noted. She is not diaphoretic. No erythema. No pallor.  Psychiatric: She has a normal mood and affect. Her behavior is normal. Judgment and thought content normal.       Lab  Results  Component Value Date   WBC 6.1 04/04/2010   HGB 12.5 04/04/2010   HCT 37.7 04/04/2010   PLT 261.0 04/04/2010   CHOL 149 04/04/2010   TRIG 76.0 04/04/2010   HDL 53.50 04/04/2010   LDLDIRECT 153.9 01/06/2007   ALT 13 04/04/2010   AST 20 04/04/2010   NA 140 04/04/2010   K 4.3 04/04/2010   CL 103 04/04/2010   CREATININE 0.8 04/04/2010   BUN 13 04/04/2010   CO2 26 04/04/2010   TSH 10.04* 04/04/2010   HGBA1C 9.4* 04/04/2010   MICROALBUR 1.2 08/14/2006     Assessment & Plan:

## 2010-11-29 NOTE — Assessment & Plan Note (Signed)
Check labs today, cont. Current meds for now

## 2010-12-25 NOTE — Op Note (Signed)
[name], [name]               ACCOUNT NO.:  [id number]   MEDICAL RECORD NO.:  [id number]          PATIENT TYPE:  AMB   LOCATION:  [facility]                         FACILITY:  [facility]   PHYSICIAN:  Mark C. Vernie Ammons, M.D.  DATE OF BIRTH:  1933-12-17   DATE OF PROCEDURE:  08/21/2007  DATE OF DISCHARGE:                               OPERATIVE REPORT   PREOPERATIVE DIAGNOSIS:  Cystocele.   POSTOPERATIVE DIAGNOSIS:  Cystocele.   PROCEDURE:  Anterior repair.   SURGEON:  Mark C. Vernie Ammons, M.D.   ANESTHESIA:  General.   DRAINS:  None.   SPECIMENS:  None.   BLOOD LOSS:  Minimal.   COMPLICATIONS:  None.   INDICATIONS:  The patient is a 77 year old female with a long history of  cystocele.  It is symptomatic, and she desires repair.  Examination  reveals a grade 3 cystocele.  She has no stress incontinence.  We  discussed the procedure, its risks, complications, and alternatives.  She understands and elected to proceed with surgery.   DESCRIPTION OF OPERATION:  After informed consent, the patient was  brought to the major O.R., placed on the table, administered general  anesthesia, and then moved to the dorsal lithotomy position.  Vagina and  genitalia were sterilely prepped with Betadine and draped.  Official  time-out was then performed.  A 16-French Foley catheter was placed in  the bladder and the bladder fully drained.   One-half percent Marcaine with epinephrine was then used to anesthetize  and infiltrate the subvaginal mucosa in the midline.  After allowing  adequate time for epinephrine effect, I made a midline incision in the  vaginal mucosa and placed Allis clamps on the edges of the cut mucosa.  I then used a combination of sharp and blunt technique to dissect the  bladder from the underlying vaginal mucosa to expose the pubocervical  ligaments on right and left sides.  I then used 1-0 Ethibond suture to  reapproximate the pubocervical ligaments in the midline,  obliterating  the cystocele.  This was performed in an interrupted fashion.  I then  excised the redundant vaginal mucosa.  The wound was irrigated with  antibiotic solution and then closed with running 2-0 Vicryl suture.  Two-  inch Iodoform gauze with Neosporin was then used as vaginal packing, and  the catheter was removed.  The patient was awakened and taken to the  recovery room in stable satisfactory condition.  She tolerated the  procedure well.  There were no intraoperative complications.  Needle,  sponge, and instrument counts were reported correct x2 at the end of the  operation.   She will be given a prescription for 36 Vicodin HP, and Keflex 250 mg  t.i.d. for 3 days.  She will follow up in my office in 4 weeks, and  remove her vaginal packing at home in 24 hours.  Written instructions  were given upon discharge to this effect.      Mark C. Vernie Ammons, M.D.  Electronically Signed     MCO/MEDQ  D:  08/21/2007  T:  08/21/2007  Job:  [id number]

## 2011-01-25 ENCOUNTER — Other Ambulatory Visit: Payer: Self-pay | Admitting: *Deleted

## 2011-01-25 MED ORDER — GLUCOSE BLOOD VI STRP: ORAL_STRIP | Status: DC

## 2011-01-25 NOTE — Telephone Encounter (Signed)
Pt called left msg [facility] accu-[facility]k strips. Called pt back to let her know [facility].Marland KitchenMarland Kitchen6/15/12@3 :39pm/LMB

## 2011-03-13 ENCOUNTER — Telehealth: Payer: Self-pay | Admitting: *Deleted

## 2011-03-13 MED ORDER — HYDROCODONE-ACETAMINOPHEN 7.5-500 MG PO TABS: 1.0000 | ORAL_TABLET | Freq: Two times a day (BID) | ORAL | Status: DC

## 2011-03-13 NOTE — Telephone Encounter (Signed)
Left pt vm to check w/pharm. Called in RX

## 2011-03-13 NOTE — Telephone Encounter (Signed)
Pt c/o neuropathy pain in her feet and is req RF of hydrocodone.

## 2011-03-13 NOTE — Telephone Encounter (Signed)
ok 

## 2011-04-18 ENCOUNTER — Ambulatory Visit (INDEPENDENT_AMBULATORY_CARE_PROVIDER_SITE_OTHER): Payer: Medicare Other | Admitting: Internal Medicine

## 2011-04-18 ENCOUNTER — Other Ambulatory Visit (INDEPENDENT_AMBULATORY_CARE_PROVIDER_SITE_OTHER): Payer: Medicare Other

## 2011-04-18 ENCOUNTER — Encounter: Payer: Self-pay | Admitting: Internal Medicine

## 2011-04-18 ENCOUNTER — Other Ambulatory Visit: Payer: Self-pay

## 2011-04-18 DIAGNOSIS — E109 Type 1 diabetes mellitus without complications: Secondary | ICD-10-CM

## 2011-04-18 DIAGNOSIS — E039 Hypothyroidism, unspecified: Secondary | ICD-10-CM

## 2011-04-18 DIAGNOSIS — L299 Pruritus, unspecified: Secondary | ICD-10-CM

## 2011-04-18 DIAGNOSIS — E119 Type 2 diabetes mellitus without complications: Secondary | ICD-10-CM

## 2011-04-18 DIAGNOSIS — Z23 Encounter for immunization: Secondary | ICD-10-CM

## 2011-04-18 DIAGNOSIS — E782 Mixed hyperlipidemia: Secondary | ICD-10-CM

## 2011-04-18 DIAGNOSIS — L298 Other pruritus: Secondary | ICD-10-CM

## 2011-04-18 DIAGNOSIS — T50905A Adverse effect of unspecified drugs, medicaments and biological substances, initial encounter: Secondary | ICD-10-CM

## 2011-04-18 DIAGNOSIS — Z1231 Encounter for screening mammogram for malignant neoplasm of breast: Secondary | ICD-10-CM

## 2011-04-18 LAB — CBC WITH DIFFERENTIAL/PLATELET
Basophils Absolute: 0 10*3/uL (ref 0.0–0.1)
Eosinophils Absolute: 0 10*3/uL (ref 0.0–0.7)
Lymphocytes Relative: 53 % — ABNORMAL HIGH (ref 12.0–46.0)
MCHC: 32.1 g/dL (ref 30.0–36.0)
Monocytes Relative: 6.8 % (ref 3.0–12.0)
Neutrophils Relative %: 39.2 % — ABNORMAL LOW (ref 43.0–77.0)
Platelets: 210 10*3/uL (ref 150.0–400.0)
RDW: 16.3 % — ABNORMAL HIGH (ref 11.5–14.6)

## 2011-04-18 LAB — LIPID PANEL
Cholesterol: 164 mg/dL (ref 0–200)
LDL Cholesterol: 84 mg/dL (ref 0–99)
Total CHOL/HDL Ratio: 3
Triglycerides: 95 mg/dL (ref 0.0–149.0)
VLDL: 19 mg/dL (ref 0.0–40.0)

## 2011-04-18 LAB — MICROALBUMIN / CREATININE URINE RATIO
Creatinine,U: 54.6 mg/dL
Microalb Creat Ratio: 1.5 mg/g (ref 0.0–30.0)
Microalb, Ur: 0.8 mg/dL (ref 0.0–1.9)

## 2011-04-18 LAB — COMPREHENSIVE METABOLIC PANEL
ALT: 13 U/L (ref 0–35)
AST: 19 U/L (ref 0–37)
Alkaline Phosphatase: 42 U/L (ref 39–117)
CO2: 28 mEq/L (ref 19–32)
Creatinine, Ser: 0.8 mg/dL (ref 0.4–1.2)
GFR: 95.43 mL/min (ref >60.00)
Total Bilirubin: 0.7 mg/dL (ref 0.3–1.2)

## 2011-04-18 LAB — URINALYSIS, ROUTINE W REFLEX MICROSCOPIC
Bilirubin Urine: NEGATIVE
Hgb urine dipstick: NEGATIVE
Ketones, ur: NEGATIVE
Nitrite: NEGATIVE
Total Protein, Urine: NEGATIVE

## 2011-04-18 LAB — TSH: TSH: 2.06 u[IU]/mL (ref 0.35–5.50)

## 2011-04-18 MED ORDER — PITAVASTATIN CALCIUM 4 MG PO TABS: 1.0000 | ORAL_TABLET | Freq: Every day | ORAL | Status: DC

## 2011-04-18 MED ORDER — LINAGLIPTIN-METFORMIN HCL 2.5-1000 MG PO TABS: 1.0000 | ORAL_TABLET | Freq: Two times a day (BID) | ORAL | Status: DC

## 2011-04-18 MED ORDER — HYDROXYZINE HCL 50 MG PO TABS: 50.0000 mg | ORAL_TABLET | Freq: Three times a day (TID) | ORAL | Status: DC | PRN

## 2011-04-18 NOTE — Patient Instructions (Signed)
Diabetes, Type 2 Diabetes is a lasting (chronic) disease. In type 2 diabetes, the pancreas does not make enough insulin (a hormone), and the body does not respond normally to the insulin that is made. This type of diabetes was also previously called adult onset diabetes. About 90% of all those who have diabetes have type 2. It usually occurs after the age of 40 but can occur at any age. CAUSES Unlike type 1 diabetes, which happens because insulin is no longer being made, type 2 diabetes happens because the body is making less insulin and has trouble using the insulin properly. SYMPTOMS  Drinking more than usual.   Urinating more than usual.   Blurred vision.   Dry, itchy skin.   Frequent infection like yeast infections in women.   More tired than usual (fatigue).  TREATMENT  Healthy eating.   Exercise.   Medication, if needed.   Monitoring blood glucose (sugar).   Seeing your caregiver regularly.  HOME CARE INSTRUCTIONS  Check your blood glucose (sugar) at least once daily. More frequent monitoring may be necessary, depending on your medications and on how well your diabetes is controlled. Your caregiver will advise you.   Take your medicine as directed by your caregiver.   Do not smoke.   Make wise food choices. Ask your caregiver for information. Weight loss can improve your diabetes.   Learn about low blood glucose (hypoglycemia) and how to treat it.   Get your eyes checked regularly.   Have a yearly physical exam. Have your blood pressure checked. Get your blood and urine tested.   Wear a pendant or bracelet saying that you have diabetes.   Check your feet every night for sores. Let your caregiver know if you have sores that are not healing.  SEEK MEDICAL CARE IF:  You are having problems keeping your blood glucose at target range.   You feel you might be having problems with your medicines.   You have symptoms of an illness that is not improving after 24  hours.   You have a sore or wound that is not healing.   You notice a change in vision or a new problem with your vision.   You develop a fever of more than 100.5.  Document Released: 07/29/2005 Document Re-Released: 08/20/2009 [employer] Patient Information 2011 [employer]. 

## 2011-04-18 NOTE — Progress Notes (Signed)
Subjective:    Patient ID: [name] [name], female    DOB: 07-18-1934, 77 y.o.   MRN: [id number]  Diabetes She presents for her follow-up diabetic visit. She has type 2 diabetes mellitus. Her disease course has been stable. There are no hypoglycemic associated symptoms. Pertinent negatives for hypoglycemia include no dizziness, headaches, pallor, seizures, speech difficulty or tremors. Pertinent negatives for diabetes include no blurred vision, no chest pain, no fatigue, no foot paresthesias, no foot ulcerations, no polydipsia, no polyphagia, no polyuria, no visual change, no weakness and no weight loss. There are no hypoglycemic complications. Symptoms are stable. There are no diabetic complications. Current diabetic treatment includes oral agent (triple therapy). She is compliant with treatment most of the time. Her weight is increasing steadily. She is following a generally healthy diet. Meal planning includes avoidance of concentrated sweets. She has not had a previous visit with a dietician. She never participates in exercise. There is no change in her home blood glucose trend. Her breakfast blood glucose range is generally 110-130 mg/dl. Her lunch blood glucose range is generally 140-180 mg/dl. Her dinner blood glucose range is generally 140-180 mg/dl. Her highest blood glucose is 180-200 mg/dl. Her overall blood glucose range is 140-180 mg/dl. An ACE inhibitor/angiotensin II receptor blocker is not being taken. She does not see a podiatrist.Eye exam is not current.  Hyperlipidemia This is a chronic problem. The current episode started more than 1 year ago. The problem is controlled. Recent lipid tests were reviewed and are variable. Exacerbating diseases include diabetes, hypothyroidism and obesity. She has no history of chronic renal disease, liver disease or nephrotic syndrome. Factors aggravating her hyperlipidemia include no known factors. Pertinent negatives include no chest pain, focal sensory  loss, focal weakness, leg pain, myalgias or shortness of breath. Current antihyperlipidemic treatment includes statins. The current treatment provides moderate improvement of lipids. Compliance problems include adherence to exercise and adherence to diet.   She has chronic, diffuse itching but no rash.    Review of Systems  Constitutional: Positive for unexpected weight change (mild weight gain). Negative for fever, weight loss, diaphoresis, activity change, appetite change and fatigue.  HENT: Negative for sore throat, facial swelling, trouble swallowing, neck pain, neck stiffness and voice change.   Eyes: Negative.  Negative for blurred vision.  Respiratory: Negative for apnea, cough, choking, chest tightness, shortness of breath, wheezing and stridor.   Cardiovascular: Negative for chest pain, palpitations and leg swelling.  Gastrointestinal: Negative for nausea, vomiting, abdominal pain, diarrhea, constipation, blood in stool, abdominal distention and anal bleeding.  Genitourinary: Negative for dysuria, urgency, polyuria, frequency, hematuria, decreased urine volume, enuresis, difficulty urinating and dyspareunia.  Musculoskeletal: Negative for myalgias, back pain, joint swelling, arthralgias and gait problem.  Skin: Negative for color change, pallor, rash and wound.  Neurological: Negative for dizziness, tremors, focal weakness, seizures, syncope, facial asymmetry, speech difficulty, weakness, light-headedness, numbness and headaches.  Hematological: Negative for polydipsia, polyphagia and adenopathy. Does not bruise/bleed easily.  Psychiatric/Behavioral: Negative.        Objective:   Physical Exam  Vitals reviewed. Constitutional: She is oriented to person, place, and time. She appears well-developed and well-nourished. No distress.  HENT:  Mouth/Throat: Oropharynx is clear and moist. No oropharyngeal exudate.  Eyes: Conjunctivae are normal. Right eye exhibits no discharge. Left eye  exhibits no discharge. No scleral icterus.  Neck: Normal range of motion. Neck supple. No JVD present. No tracheal deviation present. No thyromegaly present.  Cardiovascular: Normal rate, regular rhythm, normal heart  sounds and intact distal pulses.  Exam reveals no gallop and no friction rub.   No murmur heard. Pulmonary/Chest: Effort normal and breath sounds normal. No stridor. No respiratory distress. She has no wheezes. She has no rales. She exhibits no tenderness.  Abdominal: Soft. Bowel sounds are normal. She exhibits no distension and no mass. There is no tenderness. There is no rebound and no guarding.  Musculoskeletal: Normal range of motion. She exhibits no edema and no tenderness.  Lymphadenopathy:    She has no cervical adenopathy.  Neurological: She is oriented to person, place, and time. She displays normal reflexes. She exhibits normal muscle tone. Coordination normal.  Skin: Skin is warm and dry. No rash noted. She is not diaphoretic. No erythema. No pallor.  Psychiatric: She has a normal mood and affect. Her behavior is normal. Judgment and thought content normal.     Lab Results  Component Value Date   WBC 6.8 11/29/2010   HGB 12.1 11/29/2010   HCT 36.7 11/29/2010   PLT 328.0 11/29/2010   CHOL 167 11/29/2010   TRIG 99.0 11/29/2010   HDL 45.10 11/29/2010   LDLDIRECT 153.9 01/06/2007   ALT 13 11/29/2010   AST 20 11/29/2010   NA 141 11/29/2010   K 4.0 11/29/2010   CL 104 11/29/2010   CREATININE 0.7 11/29/2010   BUN 12 11/29/2010   CO2 27 11/29/2010   TSH 7.98* 11/29/2010   HGBA1C 9.3* 11/29/2010   MICROALBUR 1.2 08/14/2006       Assessment & Plan:

## 2011-04-18 NOTE — Assessment & Plan Note (Signed)
I will check her TSH today 

## 2011-04-18 NOTE — Assessment & Plan Note (Signed)
Continue livalo, check FLP today

## 2011-04-18 NOTE — Telephone Encounter (Signed)
Patient seen at appt today and given rx for atarax 50mg , she would like to have 25mg  instead. Please advise if ok so that we can sen to mailorder. Thanks

## 2011-04-18 NOTE — Assessment & Plan Note (Signed)
I advised her that hydrocodone can cause itching but she refuses to believe that, in the meantime I will offer her an Rx for atarax for itching

## 2011-04-18 NOTE — Assessment & Plan Note (Signed)
She will not take actos b/c someone told her that it causes bladder cancer so I changed her meds for DM today, also will check her A1C and renal function and will look at the a/c ratio to see if she needs an ARB, I have asked her to get an eye exam done

## 2011-04-19 MED ORDER — ERGOCALCIFEROL 1.25 MG (50000 UT) PO CAPS: 50000.0000 [IU] | ORAL_CAPSULE | ORAL | Status: DC

## 2011-04-19 MED ORDER — FUROSEMIDE 40 MG PO TABS: 40.0000 mg | ORAL_TABLET | Freq: Every day | ORAL | Status: DC

## 2011-04-19 MED ORDER — GLUCOSE BLOOD VI STRP: ORAL_STRIP | Status: DC

## 2011-04-19 NOTE — Telephone Encounter (Signed)
Per pt request mail order refills sent. Rx sent to local pharmacy in error, called and c/x them and resent to mail order

## 2011-04-21 ENCOUNTER — Encounter: Payer: Self-pay | Admitting: Internal Medicine

## 2011-04-22 ENCOUNTER — Other Ambulatory Visit: Payer: Self-pay | Admitting: *Deleted

## 2011-04-22 DIAGNOSIS — L298 Other pruritus: Secondary | ICD-10-CM

## 2011-04-22 MED ORDER — HYDROXYZINE HCL 50 MG PO TABS: 50.0000 mg | ORAL_TABLET | Freq: Three times a day (TID) | ORAL | Status: DC | PRN

## 2011-04-23 ENCOUNTER — Telehealth: Payer: Self-pay

## 2011-04-23 DIAGNOSIS — L298 Other pruritus: Secondary | ICD-10-CM

## 2011-04-23 NOTE — Telephone Encounter (Signed)
Patient called [name] [name] stating that she was given rx for Atarax 50mg . She is requesting a new rx for 25mg  only. She request this to be sent to [facility] order [facility]. Thanks

## 2011-04-24 MED ORDER — HYDROXYZINE HCL 25 MG PO TABS: 25.0000 mg | ORAL_TABLET | Freq: Three times a day (TID) | ORAL | Status: AC | PRN

## 2011-05-01 ENCOUNTER — Telehealth: Payer: Self-pay

## 2011-05-01 LAB — BASIC METABOLIC PANEL
BUN: 11
CO2: 29
Calcium: 9.8
Chloride: 102
Creatinine, Ser: 0.74
GFR calc Af Amer: >60
GFR calc non Af Amer: >60
Glucose, Bld: 67 — ABNORMAL LOW
Potassium: 4.2
Sodium: 139

## 2011-05-01 NOTE — Telephone Encounter (Signed)
Patient called [facility] requesting copy of lab results mailed to her. Results mailed out again, pt did not request a call back

## 2011-05-07 ENCOUNTER — Telehealth: Payer: Self-pay

## 2011-05-07 DIAGNOSIS — E119 Type 2 diabetes mellitus without complications: Secondary | ICD-10-CM

## 2011-05-07 NOTE — Telephone Encounter (Signed)
i may have missed that, I ordered it today

## 2011-05-07 NOTE — Telephone Encounter (Signed)
Patient stopped by the office completed walk in sheet requesting to know why A1c was not ordered with her other labs. Please advise

## 2011-05-09 ENCOUNTER — Other Ambulatory Visit: Payer: Self-pay | Admitting: *Deleted

## 2011-05-09 DIAGNOSIS — E119 Type 2 diabetes mellitus without complications: Secondary | ICD-10-CM

## 2011-05-09 MED ORDER — LINAGLIPTIN-METFORMIN HCL 2.5-1000 MG PO TABS: 1.0000 | ORAL_TABLET | Freq: Two times a day (BID) | ORAL | Status: DC

## 2011-05-10 NOTE — Telephone Encounter (Signed)
Pt advised and will organize transportation for lab appt next week.

## 2011-05-14 ENCOUNTER — Telehealth: Payer: Self-pay

## 2011-05-14 ENCOUNTER — Telehealth: Payer: Self-pay | Admitting: *Deleted

## 2011-05-14 DIAGNOSIS — E119 Type 2 diabetes mellitus without complications: Secondary | ICD-10-CM

## 2011-05-14 MED ORDER — LINAGLIPTIN-METFORMIN HCL 2.5-1000 MG PO TABS: 1.0000 | ORAL_TABLET | Freq: Two times a day (BID) | ORAL | Status: DC

## 2011-05-14 NOTE — Telephone Encounter (Signed)
Received fax from  [facility] w [employer],insurance stating that Jentadueto 2.12-998 is not covered w/o a PA. She must try and fail Januvia,janumet or juvisync first. Please adive if you would like to change or proceed with PA

## 2011-05-14 NOTE — Telephone Encounter (Signed)
Per pt, PA required on kombiglyze, samples provided x 1 mth

## 2011-05-15 MED ORDER — SITAGLIPTIN PHOS-METFORMIN HCL 50-1000 MG PO TABS: 1.0000 | ORAL_TABLET | Freq: Two times a day (BID) | ORAL | Status: DC

## 2011-05-15 NOTE — Telephone Encounter (Signed)
done

## 2011-06-10 ENCOUNTER — Other Ambulatory Visit: Payer: Self-pay | Admitting: *Deleted

## 2011-06-10 DIAGNOSIS — E119 Type 2 diabetes mellitus without complications: Secondary | ICD-10-CM

## 2011-06-10 MED ORDER — SITAGLIPTIN PHOS-METFORMIN HCL 50-1000 MG PO TABS: 1.0000 | ORAL_TABLET | Freq: Two times a day (BID) | ORAL | Status: DC

## 2011-07-13 ENCOUNTER — Encounter: Payer: Self-pay | Admitting: Internal Medicine

## 2011-07-13 DIAGNOSIS — Z0000 Encounter for general adult medical examination without abnormal findings: Secondary | ICD-10-CM | POA: Insufficient documentation

## 2011-07-18 ENCOUNTER — Ambulatory Visit: Payer: Medicare Other | Admitting: Internal Medicine

## 2011-07-18 DIAGNOSIS — Z0289 Encounter for other administrative examinations: Secondary | ICD-10-CM

## 2011-09-25 ENCOUNTER — Encounter: Payer: Self-pay | Admitting: Internal Medicine

## 2011-09-25 ENCOUNTER — Ambulatory Visit (INDEPENDENT_AMBULATORY_CARE_PROVIDER_SITE_OTHER)
Admission: RE | Admit: 2011-09-25 | Discharge: 2011-09-25 | Disposition: A | Discharge status: Home / Self Care | Payer: Medicare Other | Source: Ambulatory Visit | Attending: Internal Medicine | Admitting: Internal Medicine

## 2011-09-25 ENCOUNTER — Telehealth: Payer: Self-pay

## 2011-09-25 ENCOUNTER — Ambulatory Visit (INDEPENDENT_AMBULATORY_CARE_PROVIDER_SITE_OTHER): Payer: Medicare Other | Admitting: Internal Medicine

## 2011-09-25 ENCOUNTER — Other Ambulatory Visit (INDEPENDENT_AMBULATORY_CARE_PROVIDER_SITE_OTHER): Payer: Medicare Other

## 2011-09-25 DIAGNOSIS — M25512 Pain in left shoulder: Secondary | ICD-10-CM

## 2011-09-25 DIAGNOSIS — E119 Type 2 diabetes mellitus without complications: Secondary | ICD-10-CM

## 2011-09-25 DIAGNOSIS — E782 Mixed hyperlipidemia: Secondary | ICD-10-CM

## 2011-09-25 DIAGNOSIS — M12819 Other specific arthropathies, not elsewhere classified, unspecified shoulder: Secondary | ICD-10-CM

## 2011-09-25 DIAGNOSIS — E039 Hypothyroidism, unspecified: Secondary | ICD-10-CM | POA: Diagnosis not present

## 2011-09-25 DIAGNOSIS — M19019 Primary osteoarthritis, unspecified shoulder: Secondary | ICD-10-CM

## 2011-09-25 DIAGNOSIS — M1990 Unspecified osteoarthritis, unspecified site: Secondary | ICD-10-CM

## 2011-09-25 DIAGNOSIS — M75100 Unspecified rotator cuff tear or rupture of unspecified shoulder, not specified as traumatic: Secondary | ICD-10-CM | POA: Insufficient documentation

## 2011-09-25 DIAGNOSIS — E559 Vitamin D deficiency, unspecified: Secondary | ICD-10-CM

## 2011-09-25 DIAGNOSIS — Z1231 Encounter for screening mammogram for malignant neoplasm of breast: Secondary | ICD-10-CM

## 2011-09-25 DIAGNOSIS — E109 Type 1 diabetes mellitus without complications: Secondary | ICD-10-CM | POA: Diagnosis not present

## 2011-09-25 DIAGNOSIS — M25519 Pain in unspecified shoulder: Secondary | ICD-10-CM | POA: Diagnosis not present

## 2011-09-25 LAB — COMPREHENSIVE METABOLIC PANEL
ALT: 15 U/L (ref 0–35)
Albumin: 4.1 g/dL (ref 3.5–5.2)
CO2: 26 mEq/L (ref 19–32)
Calcium: 9.7 mg/dL (ref 8.4–10.5)
Chloride: 103 mEq/L (ref 96–112)
GFR: 91.16 mL/min (ref >60.00)
Glucose, Bld: 113 mg/dL — ABNORMAL HIGH (ref 70–99)
Potassium: 4.4 mEq/L (ref 3.5–5.1)
Sodium: 139 mEq/L (ref 135–145)
Total Protein: 8 g/dL (ref 6.0–8.3)

## 2011-09-25 LAB — URINALYSIS, ROUTINE W REFLEX MICROSCOPIC
Bilirubin Urine: NEGATIVE
Hgb urine dipstick: NEGATIVE
Ketones, ur: NEGATIVE
Specific Gravity, Urine: 1.025 (ref 1.000–1.030)
Urine Glucose: NEGATIVE
Urobilinogen, UA: 0.2 (ref 0.0–1.0)

## 2011-09-25 LAB — HEMOGLOBIN A1C: Hgb A1c MFr Bld: 9.1 % — ABNORMAL HIGH (ref 4.6–6.5)

## 2011-09-25 LAB — CBC WITH DIFFERENTIAL/PLATELET
Basophils Relative: 0.3 % (ref 0.0–3.0)
Eosinophils Relative: 1 % (ref 0.0–5.0)
MCV: 84.5 fl (ref 78.0–100.0)
Monocytes Absolute: 0.4 10*3/uL (ref 0.1–1.0)
Monocytes Relative: 5.8 % (ref 3.0–12.0)
Neutrophils Relative %: 48.4 % (ref 43.0–77.0)
Platelets: 247 10*3/uL (ref 150.0–400.0)
RBC: 4.66 Mil/uL (ref 3.87–5.11)
WBC: 6.2 10*3/uL (ref 4.5–10.5)

## 2011-09-25 LAB — TSH: TSH: 1.04 u[IU]/mL (ref 0.35–5.50)

## 2011-09-25 LAB — LIPID PANEL
HDL: 44.9 mg/dL (ref >39.00)
Total CHOL/HDL Ratio: 3
VLDL: 20.2 mg/dL (ref 0.0–40.0)

## 2011-09-25 MED ORDER — ERGOCALCIFEROL 1.25 MG (50000 UT) PO CAPS: 50000.0000 [IU] | ORAL_CAPSULE | ORAL | Status: DC

## 2011-09-25 MED ORDER — SITAGLIPTIN PHOS-METFORMIN HCL 50-1000 MG PO TABS: 1.0000 | ORAL_TABLET | Freq: Two times a day (BID) | ORAL | Status: DC

## 2011-09-25 MED ORDER — PITAVASTATIN CALCIUM 4 MG PO TABS: 1.0000 | ORAL_TABLET | Freq: Every day | ORAL | Status: DC

## 2011-09-25 MED ORDER — LEVOTHYROXINE SODIUM 100 MCG PO TABS: 100.0000 ug | ORAL_TABLET | Freq: Every day | ORAL | Status: DC

## 2011-09-25 MED ORDER — GLUCOSE BLOOD VI STRP: ORAL_STRIP | Status: DC

## 2011-09-25 MED ORDER — HYDROCODONE-ACETAMINOPHEN 7.5-500 MG PO TABS: 1.0000 | ORAL_TABLET | Freq: Two times a day (BID) | ORAL | Status: DC

## 2011-09-25 MED ORDER — FUROSEMIDE 40 MG PO TABS: 40.0000 mg | ORAL_TABLET | Freq: Every day | ORAL | Status: DC

## 2011-09-25 NOTE — Assessment & Plan Note (Signed)
Check the TSH today

## 2011-09-25 NOTE — Assessment & Plan Note (Signed)
I will check her a1c and will monitor her renal function today 

## 2011-09-25 NOTE — Progress Notes (Signed)
Subjective:    Patient ID: [name] [name], female    DOB: 11-13-1933, 78 y.o.   MRN: [id number]  Diabetes She presents for her follow-up diabetic visit. She has type 2 diabetes mellitus. Her disease course has been stable. There are no hypoglycemic associated symptoms. Pertinent negatives for hypoglycemia include no dizziness, headaches, pallor, seizures, speech difficulty or tremors. Pertinent negatives for diabetes include no blurred vision, no chest pain, no fatigue, no foot paresthesias, no foot ulcerations, no polydipsia, no polyphagia, no polyuria, no visual change, no weakness and no weight loss. There are no hypoglycemic complications. Symptoms are stable. There are no diabetic complications. Current diabetic treatment includes oral agent (dual therapy). She is compliant with treatment most of the time. Her weight is stable. She is following a generally healthy diet. Meal planning includes avoidance of concentrated sweets. She has not had a previous visit with a dietician. She never participates in exercise. There is no change in her home blood glucose trend. She does not see a podiatrist.Eye exam is current.      Review of Systems  Constitutional: Negative for fever, chills, weight loss, diaphoresis, activity change, appetite change, fatigue and unexpected weight change.  HENT: Negative.   Eyes: Negative.  Negative for blurred vision.  Respiratory: Negative for cough, chest tightness, shortness of breath, wheezing and stridor.   Cardiovascular: Negative for chest pain, palpitations and leg swelling.  Gastrointestinal: Negative for nausea, vomiting, abdominal pain, diarrhea, constipation, blood in stool, abdominal distention and anal bleeding.  Genitourinary: Negative.  Negative for polyuria.  Musculoskeletal: Positive for arthralgias (left shoulder pain for 2 weeks). Negative for myalgias, back pain, joint swelling and gait problem.  Skin: Negative for color change, pallor, rash and  wound.  Neurological: Negative for dizziness, tremors, seizures, syncope, facial asymmetry, speech difficulty, weakness, light-headedness, numbness and headaches.  Hematological: Negative for polydipsia, polyphagia and adenopathy. Does not bruise/bleed easily.  Psychiatric/Behavioral: Negative.        Objective:   Physical Exam  Vitals reviewed. Constitutional: She is oriented to person, place, and time. She appears well-developed and well-nourished. No distress.  HENT:  Head: Normocephalic and atraumatic.  Nose: Nose normal.  Mouth/Throat: Oropharynx is clear and moist. No oropharyngeal exudate.  Eyes: Conjunctivae are normal. Right eye exhibits no discharge. Left eye exhibits no discharge. No scleral icterus.  Neck: Normal range of motion. Neck supple. No JVD present. No tracheal deviation present. No thyromegaly present.  Cardiovascular: Normal rate, regular rhythm, normal heart sounds and intact distal pulses.  Exam reveals no gallop and no friction rub.   No murmur heard. Pulmonary/Chest: Effort normal and breath sounds normal. No stridor. No respiratory distress. She has no wheezes. She has no rales. She exhibits no tenderness.  Abdominal: Soft. Bowel sounds are normal. She exhibits no distension and no mass. There is no tenderness. There is no rebound and no guarding.  Musculoskeletal: Normal range of motion. She exhibits no edema and no tenderness.       Left shoulder: She exhibits bony tenderness. She exhibits normal range of motion, no tenderness, no swelling, no effusion, no crepitus, no deformity, no laceration, no pain, no spasm, normal pulse and normal strength.  Lymphadenopathy:    She has no cervical adenopathy.  Neurological: She is oriented to person, place, and time.  Skin: Skin is warm and dry. No rash noted. She is not diaphoretic. No erythema. No pallor.  Psychiatric: She has a normal mood and affect. Her behavior is normal. Judgment and thought content  normal.       Lab Results  Component Value Date   WBC 6.1 04/18/2011   HGB 11.3* 04/18/2011   HCT 35.1* 04/18/2011   PLT 210.0 04/18/2011   GLUCOSE 75 04/18/2011   CHOL 164 04/18/2011   TRIG 95.0 04/18/2011   HDL 61.20 04/18/2011   LDLDIRECT 153.9 01/06/2007   LDLCALC 84 04/18/2011   ALT 13 04/18/2011   AST 19 04/18/2011   NA 140 04/18/2011   K 3.6 04/18/2011   CL 104 04/18/2011   CREATININE 0.8 04/18/2011   BUN 14 04/18/2011   CO2 28 04/18/2011   TSH 2.06 04/18/2011   HGBA1C 9.3* 11/29/2010   MICROALBUR 0.8 04/18/2011      Assessment & Plan:

## 2011-09-25 NOTE — Assessment & Plan Note (Signed)
Continue current meds for pain 

## 2011-09-25 NOTE — Patient Instructions (Signed)

## 2011-09-25 NOTE — Assessment & Plan Note (Signed)
I will check a plain xray to look for djd, spurs, etc.

## 2011-09-25 NOTE — Assessment & Plan Note (Signed)
FLP today 

## 2011-09-25 NOTE — Telephone Encounter (Signed)
[facility] to advised that [facility]-script for lasix should be cx do to pt not taking it any longer. Called [phone] spoke with Apolinar Junes who noted on their acct not to fill

## 2011-09-25 NOTE — Progress Notes (Signed)
Addended by: [name] on: 09/25/2011 03:48 PM   Modules accepted: Orders

## 2011-09-26 ENCOUNTER — Telehealth: Payer: Self-pay

## 2011-09-26 ENCOUNTER — Encounter: Payer: Self-pay | Admitting: Internal Medicine

## 2011-09-26 NOTE — Telephone Encounter (Signed)
Patient called [name] [name] requesting copy of lab results mailed to her. Letter produced and mailed out

## 2011-10-02 ENCOUNTER — Telehealth: Payer: Self-pay | Admitting: Internal Medicine

## 2011-10-02 NOTE — Telephone Encounter (Signed)
Pt says hydrocodone 7.5/500 was not called in-- [facility]--Pt ph#  [phone]---

## 2011-10-03 NOTE — Telephone Encounter (Signed)
Called [facility] again to refill info

## 2011-10-24 ENCOUNTER — Ambulatory Visit (HOSPITAL_COMMUNITY): Payer: Medicare Other | Attending: Internal Medicine

## 2011-10-28 DIAGNOSIS — L03039 Cellulitis of unspecified toe: Secondary | ICD-10-CM | POA: Diagnosis not present

## 2011-10-28 DIAGNOSIS — B351 Tinea unguium: Secondary | ICD-10-CM | POA: Diagnosis not present

## 2011-10-28 DIAGNOSIS — L02619 Cutaneous abscess of unspecified foot: Secondary | ICD-10-CM | POA: Diagnosis not present

## 2011-10-28 DIAGNOSIS — M79609 Pain in unspecified limb: Secondary | ICD-10-CM | POA: Diagnosis not present

## 2011-12-24 ENCOUNTER — Other Ambulatory Visit: Payer: Self-pay | Admitting: Internal Medicine

## 2011-12-24 DIAGNOSIS — M25512 Pain in left shoulder: Secondary | ICD-10-CM

## 2011-12-24 DIAGNOSIS — M1990 Unspecified osteoarthritis, unspecified site: Secondary | ICD-10-CM

## 2011-12-24 DIAGNOSIS — E039 Hypothyroidism, unspecified: Secondary | ICD-10-CM

## 2011-12-24 MED ORDER — HYDROCODONE-ACETAMINOPHEN 7.5-500 MG PO TABS: 1.0000 | ORAL_TABLET | Freq: Two times a day (BID) | ORAL | Status: DC

## 2011-12-24 MED ORDER — LEVOTHYROXINE SODIUM 100 MCG PO TABS: 100.0000 ug | ORAL_TABLET | Freq: Every day | ORAL | Status: DC

## 2011-12-24 NOTE — Telephone Encounter (Signed)
rx printed and pending sig to fax to [facility]

## 2011-12-24 NOTE — Telephone Encounter (Signed)
Please advise if ok to refill hydrocodone for pt, PCP out of office until 12/30/11 Thanks

## 2011-12-24 NOTE — Telephone Encounter (Signed)
OK levothyrox x 12 mo OK Hydroc/APAP x 1 mo ROV w/Dr Geralyn Flash

## 2011-12-24 NOTE — Telephone Encounter (Signed)
Pt requesting levathyrocin--thyroid med--0.1 mcg--[facility]  And pain med also/hydrodcodone 7.5/500  Pt ph# [phone]

## 2011-12-25 ENCOUNTER — Other Ambulatory Visit: Payer: Self-pay

## 2011-12-25 NOTE — Telephone Encounter (Signed)
Rx for hydrocodone has been signed by [facility] and faxed to [facility].

## 2011-12-30 ENCOUNTER — Telehealth: Payer: Self-pay | Admitting: Internal Medicine

## 2011-12-30 DIAGNOSIS — E039 Hypothyroidism, unspecified: Secondary | ICD-10-CM

## 2011-12-30 MED ORDER — LEVOTHYROXINE SODIUM 100 MCG PO TABS: 100.0000 ug | ORAL_TABLET | Freq: Every day | ORAL | Status: DC

## 2011-12-30 NOTE — Telephone Encounter (Signed)
Pt says she need med call in today--levothyroxin--[facility]--pt ph# [phone]

## 2012-02-05 DIAGNOSIS — H04539 Neonatal obstruction of unspecified nasolacrimal duct: Secondary | ICD-10-CM | POA: Diagnosis not present

## 2012-02-28 LAB — HM DIABETES EYE EXAM: HM Diabetic Eye Exam: NORMAL

## 2012-03-18 ENCOUNTER — Ambulatory Visit: Payer: Medicare Other | Admitting: Internal Medicine

## 2012-03-26 DIAGNOSIS — B351 Tinea unguium: Secondary | ICD-10-CM | POA: Diagnosis not present

## 2012-04-01 ENCOUNTER — Encounter: Payer: Self-pay | Admitting: Internal Medicine

## 2012-04-01 ENCOUNTER — Ambulatory Visit (INDEPENDENT_AMBULATORY_CARE_PROVIDER_SITE_OTHER): Payer: Medicare Other | Admitting: Internal Medicine

## 2012-04-01 ENCOUNTER — Other Ambulatory Visit (INDEPENDENT_AMBULATORY_CARE_PROVIDER_SITE_OTHER): Payer: Medicare Other

## 2012-04-01 VITALS — BP 130/82 | HR 78 | Temp 98.5°F | Resp 16 | Wt 138.0 lb

## 2012-04-01 DIAGNOSIS — M25519 Pain in unspecified shoulder: Secondary | ICD-10-CM

## 2012-04-01 DIAGNOSIS — E559 Vitamin D deficiency, unspecified: Secondary | ICD-10-CM

## 2012-04-01 DIAGNOSIS — E1142 Type 2 diabetes mellitus with diabetic polyneuropathy: Secondary | ICD-10-CM

## 2012-04-01 DIAGNOSIS — E039 Hypothyroidism, unspecified: Secondary | ICD-10-CM | POA: Diagnosis not present

## 2012-04-01 DIAGNOSIS — E782 Mixed hyperlipidemia: Secondary | ICD-10-CM | POA: Diagnosis not present

## 2012-04-01 DIAGNOSIS — E119 Type 2 diabetes mellitus without complications: Secondary | ICD-10-CM

## 2012-04-01 DIAGNOSIS — E1149 Type 2 diabetes mellitus with other diabetic neurological complication: Secondary | ICD-10-CM

## 2012-04-01 DIAGNOSIS — E109 Type 1 diabetes mellitus without complications: Secondary | ICD-10-CM | POA: Diagnosis not present

## 2012-04-01 DIAGNOSIS — M25512 Pain in left shoulder: Secondary | ICD-10-CM

## 2012-04-01 DIAGNOSIS — M1990 Unspecified osteoarthritis, unspecified site: Secondary | ICD-10-CM

## 2012-04-01 LAB — COMPREHENSIVE METABOLIC PANEL
ALT: 15 U/L (ref 0–35)
AST: 18 U/L (ref 0–37)
Alkaline Phosphatase: 44 U/L (ref 39–117)
BUN: 10 mg/dL (ref 6–23)
Calcium: 9.5 mg/dL (ref 8.4–10.5)
Creatinine, Ser: 0.8 mg/dL (ref 0.4–1.2)
Total Bilirubin: 0.6 mg/dL (ref 0.3–1.2)

## 2012-04-01 LAB — CBC WITH DIFFERENTIAL/PLATELET
Basophils Absolute: 0 10*3/uL (ref 0.0–0.1)
Eosinophils Absolute: 0.1 10*3/uL (ref 0.0–0.7)
HCT: 40.7 % (ref 36.0–46.0)
Hemoglobin: 13 g/dL (ref 12.0–15.0)
Lymphs Abs: 3.1 10*3/uL (ref 0.7–4.0)
MCHC: 31.9 g/dL (ref 30.0–36.0)
Monocytes Absolute: 0.4 10*3/uL (ref 0.1–1.0)
Neutro Abs: 2.4 10*3/uL (ref 1.4–7.7)
Platelets: 243 10*3/uL (ref 150.0–400.0)
RDW: 14.8 % — ABNORMAL HIGH (ref 11.5–14.6)

## 2012-04-01 LAB — CK: Total CK: 128 U/L (ref 7–177)

## 2012-04-01 LAB — TSH: TSH: 3.88 u[IU]/mL (ref 0.35–5.50)

## 2012-04-01 MED ORDER — PITAVASTATIN CALCIUM 4 MG PO TABS: 1.0000 | ORAL_TABLET | Freq: Every day | ORAL | Status: DC

## 2012-04-01 MED ORDER — HYDROCODONE-ACETAMINOPHEN 7.5-500 MG PO TABS: 1.0000 | ORAL_TABLET | Freq: Two times a day (BID) | ORAL | Status: DC

## 2012-04-01 MED ORDER — LEVOTHYROXINE SODIUM 100 MCG PO TABS: 100.0000 ug | ORAL_TABLET | Freq: Every day | ORAL | Status: DC

## 2012-04-01 MED ORDER — GLUCOSE BLOOD VI STRP: ORAL_STRIP | Status: DC

## 2012-04-01 MED ORDER — ERGOCALCIFEROL 1.25 MG (50000 UT) PO CAPS: 50000.0000 [IU] | ORAL_CAPSULE | ORAL | Status: DC

## 2012-04-01 MED ORDER — SITAGLIPTIN PHOS-METFORMIN HCL 50-1000 MG PO TABS: 1.0000 | ORAL_TABLET | Freq: Two times a day (BID) | ORAL | Status: DC

## 2012-04-01 NOTE — Assessment & Plan Note (Signed)
Continue current meds for pain 

## 2012-04-01 NOTE — Patient Instructions (Signed)

## 2012-04-01 NOTE — Assessment & Plan Note (Signed)
She is doing well on livalo 

## 2012-04-01 NOTE — Progress Notes (Signed)
Subjective:    Patient ID: [name] [name], female    DOB: 19-Jun-1934, 77 y.o.   MRN: [id number]  Diabetes She presents for her follow-up diabetic visit. She has type 2 diabetes mellitus. Her disease course has been stable. There are no hypoglycemic associated symptoms. Pertinent negatives for hypoglycemia include no pallor. Pertinent negatives for diabetes include no blurred vision, no chest pain, no fatigue, no foot paresthesias, no foot ulcerations, no polydipsia, no polyphagia, no polyuria, no visual change, no weakness and no weight loss. There are no hypoglycemic complications. Symptoms are stable. Diabetic complications include peripheral neuropathy. Current diabetic treatment includes oral agent (dual therapy). She is compliant with treatment all of the time. Her weight is stable. She is following a generally healthy diet. Meal planning includes avoidance of concentrated sweets. She has not had a previous visit with a dietician. She participates in exercise intermittently. There is no change in her home blood glucose trend. Her breakfast blood glucose range is generally 130-140 mg/dl. Her lunch blood glucose range is generally 130-140 mg/dl. Her dinner blood glucose range is generally 140-180 mg/dl. Her highest blood glucose is 140-180 mg/dl. Her overall blood glucose range is 130-140 mg/dl. An ACE inhibitor/angiotensin II receptor blocker is not being taken. She does not see a podiatrist.Eye exam is current.  Hyperlipidemia This is a chronic problem. The current episode started more than 1 year ago. The problem is controlled. Recent lipid tests were reviewed and are variable. Exacerbating diseases include diabetes and hypothyroidism. She has no history of chronic renal disease, liver disease, obesity or nephrotic syndrome. Pertinent negatives include no chest pain, focal sensory loss, focal weakness, leg pain, myalgias or shortness of breath. Current antihyperlipidemic treatment includes statins. The  current treatment provides moderate improvement of lipids. There are no compliance problems.       Review of Systems  Constitutional: Negative for fever, chills, weight loss, diaphoresis, activity change, appetite change, fatigue and unexpected weight change.  HENT: Negative.   Eyes: Negative.  Negative for blurred vision.  Respiratory: Negative for cough, chest tightness, shortness of breath, wheezing and stridor.   Cardiovascular: Negative for chest pain, palpitations and leg swelling.  Gastrointestinal: Negative for nausea, vomiting, abdominal pain, diarrhea and constipation.  Genitourinary: Negative.  Negative for polyuria.  Musculoskeletal: Positive for arthralgias (shoulders and knees). Negative for myalgias, back pain, joint swelling and gait problem.  Skin: Negative for color change, pallor, rash and wound.  Neurological: Negative.  Negative for focal weakness and weakness.  Hematological: Negative for polydipsia, polyphagia and adenopathy. Does not bruise/bleed easily.  Psychiatric/Behavioral: Negative.        Objective:   Physical Exam  Vitals reviewed. Constitutional: She is oriented to person, place, and time. She appears well-developed and well-nourished. No distress.  HENT:  Head: Atraumatic.  Mouth/Throat: Oropharynx is clear and moist. No oropharyngeal exudate.  Eyes: Conjunctivae are normal. Right eye exhibits no discharge. Left eye exhibits no discharge. No scleral icterus.  Neck: Normal range of motion. Neck supple. No JVD present. No tracheal deviation present. No thyromegaly present.  Cardiovascular: Normal rate, regular rhythm, normal heart sounds and intact distal pulses.  Exam reveals no gallop and no friction rub.   No murmur heard. Pulmonary/Chest: Effort normal and breath sounds normal. No stridor. No respiratory distress. She has no wheezes. She has no rales. She exhibits no tenderness.  Abdominal: Soft. Bowel sounds are normal. She exhibits no distension  and no mass. There is no tenderness. There is no rebound and no  guarding.  Musculoskeletal: Normal range of motion. She exhibits no edema and no tenderness.  Lymphadenopathy:    She has no cervical adenopathy.  Neurological: She is oriented to person, place, and time.  Skin: Skin is warm and dry. No rash noted. She is not diaphoretic. No erythema. No pallor.  Psychiatric: She has a normal mood and affect. Her behavior is normal. Judgment and thought content normal.     Lab Results  Component Value Date   WBC 6.2 09/25/2011   HGB 12.5 09/25/2011   HCT 39.3 09/25/2011   PLT 247.0 09/25/2011   GLUCOSE 113* 09/25/2011   CHOL 153 09/25/2011   TRIG 101.0 09/25/2011   HDL 44.90 09/25/2011   LDLDIRECT 153.9 01/06/2007   LDLCALC 88 09/25/2011   ALT 15 09/25/2011   AST 19 09/25/2011   NA 139 09/25/2011   K 4.4 09/25/2011   CL 103 09/25/2011   CREATININE 0.8 09/25/2011   BUN 11 09/25/2011   CO2 26 09/25/2011   TSH 1.04 09/25/2011   HGBA1C 9.1* 09/25/2011   MICROALBUR 0.8 04/18/2011       Assessment & Plan:

## 2012-04-01 NOTE — Assessment & Plan Note (Signed)
I will check her TSH level today 

## 2012-04-01 NOTE — Assessment & Plan Note (Signed)
The pain meds are controlling her symptoms

## 2012-04-01 NOTE — Assessment & Plan Note (Signed)
I will check her A1C to see if she is well controlled and will screen for insulin deficiency with a c-peptide

## 2012-04-02 ENCOUNTER — Encounter: Payer: Self-pay | Admitting: Internal Medicine

## 2012-04-02 LAB — C-PEPTIDE: C-Peptide: 1.93 ng/mL (ref 0.80–3.90)

## 2012-04-06 ENCOUNTER — Telehealth: Payer: Self-pay | Admitting: Internal Medicine

## 2012-04-06 MED ORDER — GLIMEPIRIDE 4 MG PO TABS: 4.0000 mg | ORAL_TABLET | Freq: Every day | ORAL | Status: DC

## 2012-04-06 NOTE — Telephone Encounter (Signed)
The medol dose pak can have a very slight impact on the a1c Yes, she can restart the regimen

## 2012-04-06 NOTE — Telephone Encounter (Signed)
Medication called in to mail order and pt notified

## 2012-04-06 NOTE — Telephone Encounter (Signed)
Returned call to patient who is concerned about the increase on recent a1c results. 1) She states that she was started on a medrol dose pak and would like to know if that could have had an impact on the level. 2) Patient states that she was to also take glimepiride in addition to janumet and would like to know if she may restart that regimen again. Please advise Thanks

## 2012-05-06 ENCOUNTER — Ambulatory Visit: Payer: Medicare Other

## 2012-05-19 DIAGNOSIS — S82409A Unspecified fracture of shaft of unspecified fibula, initial encounter for closed fracture: Secondary | ICD-10-CM | POA: Diagnosis not present

## 2012-05-19 DIAGNOSIS — Z23 Encounter for immunization: Secondary | ICD-10-CM | POA: Diagnosis not present

## 2012-05-28 DIAGNOSIS — S82409A Unspecified fracture of shaft of unspecified fibula, initial encounter for closed fracture: Secondary | ICD-10-CM | POA: Diagnosis not present

## 2012-07-29 ENCOUNTER — Telehealth: Payer: Self-pay | Admitting: Internal Medicine

## 2012-07-29 NOTE — Telephone Encounter (Signed)
Patient Information:  Caller Name: [name]  Phone: [phone]  Patient: [name] [name]  Gender: Female  DOB: Jun 25, 1934  Age: 78 Years  PCP: Sanda Linger (Adults only)  Office Follow Up:  Does the office need to follow up with this patient?: Yes  Instructions For The Office: Please call concerning appointment, non available for the rest of today.  RN Note:  Patient states she was having difficulty breathing earlier and went to Urgent Care, they told her a 3 hr wait so she decided to call office. Breathing has been better just mild SOB and none is present at this moment. It comes and goes. She has listened to her own chest with stethoscope and can hear something ?rales, not sure if it is a wheeze. No appointments seen for the rest of today, message sent.  Symptoms  Reason For Call & Symptoms: SOB at times, not present right now but had it earlier today. It was present even at rest.  Reviewed Health History In EMR: Yes  Reviewed Medications In EMR: Yes  Reviewed Allergies In EMR: Yes  Reviewed Surgeries / Procedures: Yes  Date of Onset of Symptoms: 07/27/2012  Treatments Tried: Advill  Treatments Tried Worked: No  Guideline(s) Used:  Breathing Difficulty  Disposition Per Guideline:   Go to Office Now  Reason For Disposition Reached:   Mild difficulty breathing (e.g., minimal/no SOB at rest, SOB with walking, pulse < 100) of new onset or worse than normal  Advice Given:  General Care Advice for Breathing Difficulty:  Find position of greatest comfort. For most patients the best position is semi-upright (e.g., sitting up in a comfortable chair or lying back against pillows).  Elevate head of bed (e.g., use pillows or place blocks under bed).  Avoid smoke or fume exposur  Limit activities or space activities apart during the day. Prioritize activities.  Use a humidifier.    Call Back If:  Severe difficulty breathing occurs  You become worse.

## 2012-07-30 ENCOUNTER — Encounter (HOSPITAL_COMMUNITY): Payer: Self-pay | Admitting: Emergency Medicine

## 2012-07-30 ENCOUNTER — Ambulatory Visit (INDEPENDENT_AMBULATORY_CARE_PROVIDER_SITE_OTHER): Payer: Medicare Other | Admitting: Internal Medicine

## 2012-07-30 ENCOUNTER — Inpatient Hospital Stay (HOSPITAL_COMMUNITY)
Admission: EM | Admit: 2012-07-30 | Discharge: 2012-08-07 | DRG: 291 | Disposition: A | Discharge status: Home Health | Payer: Medicare Other | Attending: Internal Medicine | Admitting: Internal Medicine

## 2012-07-30 ENCOUNTER — Encounter: Payer: Self-pay | Admitting: Internal Medicine

## 2012-07-30 ENCOUNTER — Emergency Department (HOSPITAL_COMMUNITY): Payer: Medicare Other

## 2012-07-30 ENCOUNTER — Other Ambulatory Visit (INDEPENDENT_AMBULATORY_CARE_PROVIDER_SITE_OTHER): Payer: Medicare Other

## 2012-07-30 VITALS — BP 112/60 | HR 99 | Temp 98.3°F | Resp 16 | Wt 144.0 lb

## 2012-07-30 DIAGNOSIS — M1990 Unspecified osteoarthritis, unspecified site: Secondary | ICD-10-CM

## 2012-07-30 DIAGNOSIS — E039 Hypothyroidism, unspecified: Secondary | ICD-10-CM | POA: Diagnosis not present

## 2012-07-30 DIAGNOSIS — T460X5A Adverse effect of cardiac-stimulant glycosides and drugs of similar action, initial encounter: Secondary | ICD-10-CM | POA: Diagnosis not present

## 2012-07-30 DIAGNOSIS — R791 Abnormal coagulation profile: Secondary | ICD-10-CM | POA: Diagnosis not present

## 2012-07-30 DIAGNOSIS — I5021 Acute systolic (congestive) heart failure: Secondary | ICD-10-CM | POA: Diagnosis not present

## 2012-07-30 DIAGNOSIS — D729 Disorder of white blood cells, unspecified: Secondary | ICD-10-CM | POA: Diagnosis present

## 2012-07-30 DIAGNOSIS — I509 Heart failure, unspecified: Secondary | ICD-10-CM | POA: Diagnosis not present

## 2012-07-30 DIAGNOSIS — I420 Dilated cardiomyopathy: Secondary | ICD-10-CM | POA: Diagnosis present

## 2012-07-30 DIAGNOSIS — R0609 Other forms of dyspnea: Secondary | ICD-10-CM | POA: Insufficient documentation

## 2012-07-30 DIAGNOSIS — J811 Chronic pulmonary edema: Secondary | ICD-10-CM | POA: Diagnosis not present

## 2012-07-30 DIAGNOSIS — J189 Pneumonia, unspecified organism: Secondary | ICD-10-CM | POA: Diagnosis not present

## 2012-07-30 DIAGNOSIS — I428 Other cardiomyopathies: Secondary | ICD-10-CM | POA: Diagnosis present

## 2012-07-30 DIAGNOSIS — Z1231 Encounter for screening mammogram for malignant neoplasm of breast: Secondary | ICD-10-CM

## 2012-07-30 DIAGNOSIS — J9819 Other pulmonary collapse: Secondary | ICD-10-CM | POA: Diagnosis not present

## 2012-07-30 DIAGNOSIS — Z452 Encounter for adjustment and management of vascular access device: Secondary | ICD-10-CM | POA: Diagnosis not present

## 2012-07-30 DIAGNOSIS — R579 Shock, unspecified: Secondary | ICD-10-CM | POA: Diagnosis not present

## 2012-07-30 DIAGNOSIS — R112 Nausea with vomiting, unspecified: Secondary | ICD-10-CM | POA: Diagnosis not present

## 2012-07-30 DIAGNOSIS — R0989 Other specified symptoms and signs involving the circulatory and respiratory systems: Secondary | ICD-10-CM

## 2012-07-30 DIAGNOSIS — I059 Rheumatic mitral valve disease, unspecified: Secondary | ICD-10-CM | POA: Diagnosis not present

## 2012-07-30 DIAGNOSIS — R0602 Shortness of breath: Secondary | ICD-10-CM | POA: Diagnosis not present

## 2012-07-30 DIAGNOSIS — E782 Mixed hyperlipidemia: Secondary | ICD-10-CM | POA: Diagnosis present

## 2012-07-30 DIAGNOSIS — R9431 Abnormal electrocardiogram [ECG] [EKG]: Secondary | ICD-10-CM | POA: Diagnosis not present

## 2012-07-30 DIAGNOSIS — I447 Left bundle-branch block, unspecified: Secondary | ICD-10-CM

## 2012-07-30 DIAGNOSIS — I959 Hypotension, unspecified: Secondary | ICD-10-CM

## 2012-07-30 DIAGNOSIS — E1149 Type 2 diabetes mellitus with other diabetic neurological complication: Secondary | ICD-10-CM

## 2012-07-30 DIAGNOSIS — I10 Essential (primary) hypertension: Secondary | ICD-10-CM

## 2012-07-30 DIAGNOSIS — J9600 Acute respiratory failure, unspecified whether with hypoxia or hypercapnia: Secondary | ICD-10-CM

## 2012-07-30 DIAGNOSIS — N179 Acute kidney failure, unspecified: Secondary | ICD-10-CM

## 2012-07-30 DIAGNOSIS — R7989 Other specified abnormal findings of blood chemistry: Secondary | ICD-10-CM

## 2012-07-30 DIAGNOSIS — Z0000 Encounter for general adult medical examination without abnormal findings: Secondary | ICD-10-CM

## 2012-07-30 DIAGNOSIS — E1140 Type 2 diabetes mellitus with diabetic neuropathy, unspecified: Secondary | ICD-10-CM | POA: Diagnosis present

## 2012-07-30 DIAGNOSIS — M75100 Unspecified rotator cuff tear or rupture of unspecified shoulder, not specified as traumatic: Secondary | ICD-10-CM

## 2012-07-30 DIAGNOSIS — R7309 Other abnormal glucose: Secondary | ICD-10-CM

## 2012-07-30 DIAGNOSIS — E1142 Type 2 diabetes mellitus with diabetic polyneuropathy: Secondary | ICD-10-CM | POA: Diagnosis present

## 2012-07-30 DIAGNOSIS — R197 Diarrhea, unspecified: Secondary | ICD-10-CM | POA: Diagnosis not present

## 2012-07-30 DIAGNOSIS — J984 Other disorders of lung: Secondary | ICD-10-CM | POA: Diagnosis not present

## 2012-07-30 DIAGNOSIS — E559 Vitamin D deficiency, unspecified: Secondary | ICD-10-CM

## 2012-07-30 DIAGNOSIS — G250 Essential tremor: Secondary | ICD-10-CM

## 2012-07-30 DIAGNOSIS — I429 Cardiomyopathy, unspecified: Secondary | ICD-10-CM

## 2012-07-30 DIAGNOSIS — J90 Pleural effusion, not elsewhere classified: Secondary | ICD-10-CM | POA: Diagnosis not present

## 2012-07-30 DIAGNOSIS — E785 Hyperlipidemia, unspecified: Secondary | ICD-10-CM | POA: Diagnosis present

## 2012-07-30 LAB — D-DIMER, QUANTITATIVE: D-Dimer, Quant: 7.11 ug/mL-FEU — ABNORMAL HIGH (ref 0.00–0.48)

## 2012-07-30 LAB — CBC WITH DIFFERENTIAL/PLATELET
Basophils Absolute: 0.2 10*3/uL — ABNORMAL HIGH (ref 0.0–0.1)
Basophils Relative: 3.1 % — ABNORMAL HIGH (ref 0.0–3.0)
HCT: 37.8 % (ref 36.0–46.0)
Hemoglobin: 12.2 g/dL (ref 12.0–15.0)
Lymphocytes Relative: 36 % (ref 12.0–46.0)
Lymphs Abs: 2 10*3/uL (ref 0.7–4.0)
MCHC: 32.4 g/dL (ref 30.0–36.0)
Monocytes Relative: 4.7 % (ref 3.0–12.0)
Neutro Abs: 3.1 10*3/uL (ref 1.4–7.7)
RBC: 4.55 Mil/uL (ref 3.87–5.11)
RDW: 15.7 % — ABNORMAL HIGH (ref 11.5–14.6)

## 2012-07-30 LAB — COMPREHENSIVE METABOLIC PANEL
ALT: 17 U/L (ref 0–35)
AST: 19 U/L (ref 0–37)
BUN: 12 mg/dL (ref 6–23)
Calcium: 9.7 mg/dL (ref 8.4–10.5)
Creatinine, Ser: 0.9 mg/dL (ref 0.4–1.2)
Total Bilirubin: 0.8 mg/dL (ref 0.3–1.2)

## 2012-07-30 LAB — TSH: TSH: 3.64 u[IU]/mL (ref 0.35–5.50)

## 2012-07-30 LAB — GLUCOSE, CAPILLARY

## 2012-07-30 LAB — TROPONIN I
Troponin I: 0.02 ng/mL (ref <0.06)
Troponin I: <0.3 ng/mL (ref <0.30)

## 2012-07-30 LAB — BRAIN NATRIURETIC PEPTIDE: Pro B Natriuretic peptide (BNP): 674 pg/mL — ABNORMAL HIGH (ref 0.0–100.0)

## 2012-07-30 LAB — CARDIAC PANEL
CK-MB: 3 ng/mL (ref 0.3–4.0)
Relative Index: 1.9 calc (ref 0.0–2.5)

## 2012-07-30 MED ORDER — FUROSEMIDE 10 MG/ML IJ SOLN
40.0000 mg | Freq: Once | INTRAMUSCULAR | Status: AC
  Administered 2012-07-30: 40 mg via INTRAVENOUS
  Filled 2012-07-30: qty 4

## 2012-07-30 MED ORDER — ASPIRIN EC 81 MG PO TBEC
81.0000 mg | DELAYED_RELEASE_TABLET | Freq: Every day | ORAL | Status: DC
  Administered 2012-07-30 – 2012-08-07 (×9): 81 mg via ORAL
  Filled 2012-07-30 (×9): qty 1

## 2012-07-30 MED ORDER — SODIUM CHLORIDE 0.9 % IV BOLUS (SEPSIS)
500.0000 mL | Freq: Once | INTRAVENOUS | Status: AC
  Administered 2012-07-30: 500 mL via INTRAVENOUS

## 2012-07-30 MED ORDER — LEVOFLOXACIN IN D5W 750 MG/150ML IV SOLN
750.0000 mg | INTRAVENOUS | Status: DC
  Administered 2012-07-30 – 2012-07-31 (×2): 750 mg via INTRAVENOUS
  Filled 2012-07-30 (×3): qty 150

## 2012-07-30 MED ORDER — SODIUM CHLORIDE 0.9 % IJ SOLN
3.0000 mL | Freq: Two times a day (BID) | INTRAMUSCULAR | Status: DC
  Administered 2012-07-31 – 2012-08-02 (×7): 3 mL via INTRAVENOUS

## 2012-07-30 MED ORDER — INSULIN ASPART 100 UNIT/ML ~~LOC~~ SOLN
0.0000 [IU] | Freq: Four times a day (QID) | SUBCUTANEOUS | Status: DC
  Administered 2012-07-30: 5 [IU] via SUBCUTANEOUS
  Administered 2012-07-31 (×4): 2 [IU] via SUBCUTANEOUS
  Administered 2012-08-01: 1 [IU] via SUBCUTANEOUS
  Administered 2012-08-01: 3 [IU] via SUBCUTANEOUS
  Administered 2012-08-01: 2 [IU] via SUBCUTANEOUS
  Administered 2012-08-01 – 2012-08-02 (×2): 3 [IU] via SUBCUTANEOUS
  Administered 2012-08-02: 2 [IU] via SUBCUTANEOUS
  Administered 2012-08-02: 5 [IU] via SUBCUTANEOUS
  Administered 2012-08-02: 2 [IU] via SUBCUTANEOUS
  Administered 2012-08-03: 3 [IU] via SUBCUTANEOUS
  Administered 2012-08-03: 1 [IU] via SUBCUTANEOUS
  Administered 2012-08-03 – 2012-08-04 (×2): 2 [IU] via SUBCUTANEOUS

## 2012-07-30 MED ORDER — SODIUM CHLORIDE 0.9 % IJ SOLN
3.0000 mL | INTRAMUSCULAR | Status: DC | PRN
  Administered 2012-08-04: 3 mL via INTRAVENOUS

## 2012-07-30 MED ORDER — NITROGLYCERIN 0.4 MG SL SUBL
0.4000 mg | SUBLINGUAL_TABLET | Freq: Once | SUBLINGUAL | Status: AC
  Administered 2012-07-30: 0.4 mg via SUBLINGUAL
  Filled 2012-07-30: qty 25

## 2012-07-30 MED ORDER — NITROGLYCERIN IN D5W 200-5 MCG/ML-% IV SOLN
5.0000 ug/min | INTRAVENOUS | Status: DC
  Administered 2012-07-30: 5 ug/min via INTRAVENOUS
  Filled 2012-07-30: qty 250

## 2012-07-30 MED ORDER — ONDANSETRON HCL 4 MG/2ML IJ SOLN
4.0000 mg | Freq: Four times a day (QID) | INTRAMUSCULAR | Status: DC | PRN
  Administered 2012-08-03 – 2012-08-04 (×4): 4 mg via INTRAVENOUS
  Filled 2012-07-30 (×4): qty 2

## 2012-07-30 MED ORDER — IOHEXOL 350 MG/ML SOLN
100.0000 mL | Freq: Once | INTRAVENOUS | Status: AC | PRN
  Administered 2012-07-30: 80 mL via INTRAVENOUS

## 2012-07-30 MED ORDER — MORPHINE SULFATE 2 MG/ML IJ SOLN
1.0000 mg | INTRAMUSCULAR | Status: DC | PRN
  Administered 2012-07-30: 1 mg via INTRAVENOUS
  Filled 2012-07-30: qty 1

## 2012-07-30 MED ORDER — DEXTROSE 5 % IV SOLN
1.0000 g | Freq: Once | INTRAVENOUS | Status: AC
  Administered 2012-07-30: 1 g via INTRAVENOUS
  Filled 2012-07-30: qty 10

## 2012-07-30 MED ORDER — LEVOTHYROXINE SODIUM 100 MCG PO TABS
100.0000 ug | ORAL_TABLET | Freq: Every day | ORAL | Status: DC
  Administered 2012-07-31 – 2012-08-07 (×7): 100 ug via ORAL
  Filled 2012-07-30 (×9): qty 1

## 2012-07-30 MED ORDER — SODIUM CHLORIDE 0.9 % IV SOLN: 250.0000 mL | INTRAVENOUS | Status: DC | PRN

## 2012-07-30 MED ORDER — MORPHINE SULFATE 2 MG/ML IJ SOLN
INTRAMUSCULAR | Status: AC
  Administered 2012-07-30: 2 mg via INTRAVENOUS
  Filled 2012-07-30: qty 1

## 2012-07-30 MED ORDER — ENOXAPARIN SODIUM 40 MG/0.4ML ~~LOC~~ SOLN
40.0000 mg | SUBCUTANEOUS | Status: DC
  Administered 2012-07-30 – 2012-08-02 (×4): 40 mg via SUBCUTANEOUS
  Filled 2012-07-30 (×5): qty 0.4

## 2012-07-30 MED ORDER — ONDANSETRON HCL 4 MG PO TABS
4.0000 mg | ORAL_TABLET | Freq: Four times a day (QID) | ORAL | Status: DC | PRN
  Administered 2012-08-01 – 2012-08-02 (×2): 4 mg via ORAL
  Filled 2012-07-30 (×2): qty 1

## 2012-07-30 MED ORDER — SODIUM CHLORIDE 0.9 % IJ SOLN
3.0000 mL | Freq: Two times a day (BID) | INTRAMUSCULAR | Status: DC
  Administered 2012-08-02: 3 mL via INTRAVENOUS

## 2012-07-30 MED ORDER — FUROSEMIDE 10 MG/ML IJ SOLN
40.0000 mg | Freq: Three times a day (TID) | INTRAMUSCULAR | Status: DC
  Administered 2012-07-30 – 2012-08-01 (×6): 40 mg via INTRAVENOUS
  Filled 2012-07-30 (×9): qty 4

## 2012-07-30 MED ORDER — MORPHINE SULFATE 2 MG/ML IJ SOLN
2.0000 mg | Freq: Once | INTRAMUSCULAR | Status: AC
  Administered 2012-07-30: 2 mg via INTRAVENOUS

## 2012-07-30 NOTE — ED Notes (Signed)
Patient transported to CT 

## 2012-07-30 NOTE — ED Notes (Signed)
Pt's O2 sat at 88% on RA.  Put on 3L [place].  O2 sat increased to 93%.

## 2012-07-30 NOTE — H&P (Signed)
[facility] History and Physical  [name] [name] NGE:[id number] DOB: [birth year] DOA: 07/30/2012  Referring physician: Dr. Rubin Payor  PCP: Sanda Linger, MD    Chief Complaint:  Chief Complaint  Patient presents with  . Shortness of Breath     HPI: [name] [name] is a 78 y.o. female with history of diabetes, hypothyroidism and hypertension who was sent to the emergency room today by her primary care physician with worsening dyspnea. Patient has had dyspnea since Monday and she also reports wheezing and inability to sleep. Patient denies fever, hemoptysis, sputum production, edema in her legs. She reports that she felt congested in her sinuses. The patient has no prior cardiac history. Today she had blood work done in the office and had a d-dimer of 7 and was referred to the emergency room for evaluation. In the emergency room the patient had a bolus of 500 cc normal saline and a CT angiogram of the chest which was negative for pulmonary emboli but positive for bilateral infiltrates and bilateral pleural effusions. I was called to admit this patient at 5:50 PM. Upon my arrival in the emergency room and found the patient tachypneic with sats dropping into the 70s on 10 L nasal cannula. I have promptly treated the patient with Lasix 40 mg IV, morphine 2 mg IV and, initiated BiPAP for respiratory support. Patient was also placed on a nitroglycerin drip which was ordered prior to my arrival by the emergency room physician.    Review of Systems: The patient denies anorexia, fever, weight loss,, vision loss, decreased hearing, hoarseness, chest pain, syncope, peripheral edema, balance deficits, hemoptysis, abdominal pain, melena, hematochezia, severe indigestion/heartburn, hematuria, incontinence, genital sores, muscle weakness, suspicious skin lesions, transient blindness, difficulty walking, depression, unusual weight change, abnormal bleeding, enlarged lymph nodes, angioedema, and breast masses.     Past Medical History  Diagnosis Date  . Hypothyroidism   . High cholesterol   . Osteoarthritis   . Diabetes mellitus    Past Surgical History  Procedure Date  . Abdominal hysterectomy 1980  . Bmd 2006   Social History:  reports that she has never smoked. She does not have any smokeless tobacco history on file. She reports that she does not drink alcohol or use illicit drugs. The patient lives alone and is fully functional  Allergies  Allergen Reactions  . Statins     Family History  Problem Relation Age of Onset  . Diabetes Other   . Hypertension Other   . Uterine cancer Other     Prior to Admission medications   Medication Sig Start Date End Date Taking? Authorizing Provider  ergocalciferol (VITAMIN D2) 50000 UNITS capsule Take 50,000 Units by mouth once a week. 04/01/12  Yes [name], MD  glimepiride (AMARYL) 4 MG tablet Take 4 mg by mouth daily before breakfast. 04/06/12 04/06/13 Yes [name], MD  glucose blood test strip 1 each by Other route as needed. Test twice daily.  Please dispense a 3 month supply 04/01/12  Yes [name], MD  Lancets (ACCU-CHEK MULTICLIX) lancets 1 each by Other route daily. Use as instructed    Yes Historical Provider, MD  levothyroxine (SYNTHROID, LEVOTHROID) 100 MCG tablet Take 100 mcg by mouth daily. BRAND MEDICALLY NECESSARY. 04/01/12  Yes [name], MD  Pitavastatin Calcium 4 MG TABS Take 1 tablet by mouth daily. For cholesterol. 04/01/12  Yes [name], MD  sitaGLIPtan-metformin (JANUMET) 50-1000 MG per tablet Take 1 tablet by mouth 2 (  two) times daily with a meal. 04/01/12 04/01/13 Yes [name], MD   Physical Exam: Filed Vitals:   07/30/12 1610 07/30/12 1733  BP: 141/73 162/91  Pulse: 104 109  Temp: 98.1 F (36.7 C) 98.6 F (37 C)  TempSrc: Oral   Resp: 20 32  Height: 5\' 4"  (1.626 m)   Weight: 63.504 kg (140 lb)   SpO2: 93% 90%     General:  Alert and oriented x3 very anxious and  restless  Eyes: Pupil equal and round react to light accommodation  ENT: No pharyngeal erythema and   Neck: bilateral JVD, no thyromegaly  Cardiovascular: Tachycardic, regular, S3 gallop, no murmurs  Respiratory: Bilateral wheezes, rhonchi and crackles, tachypneic, using accessory muscles to breathe  Abdomen: Soft nontender nondistended, bowel sounds present  Skin: No suspicious looking rashes  Musculoskeletal: Intact muscle bulk and tone without obvious joint deformities  Psychiatric: Anxious  Neurologic: Cranial nerves 2-12 intact, strength 5 in all 4 extremities, sensation intact  Labs on Admission:  Basic Metabolic Panel:  Lab 07/30/12 0981  NA 140  K 4.0  CL 105  CO2 25  GLUCOSE 266*  BUN 12  CREATININE 0.9  CALCIUM 9.7  MG --  PHOS --   Liver Function Tests:  Lab 07/30/12 1144  AST 19  ALT 17  ALKPHOS 46  BILITOT 0.8  PROT 7.9  ALBUMIN 4.0   No results found for this basename: LIPASE:5,AMYLASE:5 in the last 168 hours No results found for this basename: AMMONIA:5 in the last 168 hours CBC:  Lab 07/30/12 1144  WBC 5.7  NEUTROABS 3.1  HGB 12.2  HCT 37.8  MCV 83.0  PLT 258.0   Cardiac Enzymes:  Lab 07/30/12 1144  CKTOTAL 159  CKMB 3.0  CKMBINDEX --  TROPONINI 0.02    BNP (last 3 results)  Basename 07/30/12 1144  PROBNP 674.0*   CBG: No results found for this basename: GLUCAP:5 in the last 168 hours  Radiological Exams on Admission: Ct Angio Chest W/cm &/or Wo Cm  07/30/2012  *RADIOLOGY REPORT*  Clinical Data: Shortness of breath.  Elevated D-dimer.  CT ANGIOGRAPHY CHEST  Technique:  Multidetector CT imaging of the chest using the standard protocol during bolus administration of intravenous contrast. Multiplanar reconstructed images including MIPs were obtained and reviewed to evaluate the vascular anatomy.  Contrast: 80mL OMNIPAQUE IOHEXOL 350 MG/ML SOLN  Comparison: None.  Findings: Satisfactory opacification of the pulmonary arteries  noted, and there is no evidence of pulmonary emboli.  No evidence of thoracic aortic aneurysm.  Poor contrast opacification of thoracic aorta seen, but no definite dissection visualized.  No evidence of mediastinal hematoma or mass.  Moderate right and small left pleural effusions are seen.  Diffuse interstitial infiltrates are seen, consistent with interstitial edema. There is also streaky opacity with air bronchograms in the right middle lobe, and superimposed pneumonia cannot be excluded.  No central endobronchial obstruction identified.  No evidence of hilar or mediastinal lymphadenopathy. Borderline cardiomegaly noted.  IMPRESSION:  1.  No evidence of pulmonary embolism. 2.  Diffuse interstitial infiltrates or edema and bilateral pleural effusions, highly suspicious for congestive heart failure. 3.  Right middle lobe infiltrate versus atelectasis. Superimposed pneumonia cannot be excluded. 4.  No mass or lymphadenopathy identified.   Original Report Authenticated By: Myles Rosenthal, M.D.     EKG: Independently reviewed. Sinus tachycardia and left bundle branch block  Assessment/Plan Principal Problem:  *Acute respiratory failure Active Problems:  Pulmonary edema  HYPOTHYROIDISM  Type  II or unspecified type diabetes mellitus with neurological manifestations, uncontrolled(250.62)  DIABETIC PERIPHERAL NEUROPATHY  VITAMIN D DEFICIENCY  HYPERLIPIDEMIA, MIXED  Elevated d-dimer  LBBB (left bundle branch block)   1. Acute respiratory failure most likely due to pulmonary edema but cannot rule out presence of interstitial pneumonia. The patient has not had fever or, chills, sputum production and she does not have leukocytosis - for these reasons I suspect that she probably has pulmonary edema. We will treat with IV Lasix, BiPAP and, nitroglycerin drip. We will repeat troponins and obtain an echocardiogram in the morning. If patient starts having chest pain or troponins become positive she'll require an  emergent cardiology consultation. We will also treat the patient with IV Levaquin just in case she has atypical pneumonia. Upon admission to the ICU we'll obtain an ABG and we have discussed the patient's situation with the critical care physician in the electronic ICU. 2. Diabetes mellitus type 2 for now due to patient's severity of the illness we'll use sliding scale insulin only 3. Hypothyroidism we'll continue Synthroid without changes 4. Elevated d-dimer - suggestive of an inflammatory state unclear etiology. Patient already had a CT scan of the chest was negative for pulmonary emboli. Alternatively the patient may have a malignancy that was not uncovered yet. We will have to keep these things in mind as we treat the patient 5. New left bundle branch block - will obtain echocardiogram, cardiac enzymes  Discussed with CCM MD in elink Dr. Marin Shutter   Code Status: full  Family Communication: 2 sons at the bedside  Disposition Plan: ICU   Time spent: 1 hour  Caidon Foti [facility] Pager [phone]  If 7PM-7AM, please contact night-coverage [email or web address] Password [facility] 07/30/2012, 6:16 PM

## 2012-07-30 NOTE — Progress Notes (Signed)
Subjective:    Patient ID: [name] [name], female    DOB: 1934/08/04, 78 y.o.   MRN: [id number]  Shortness of Breath This is a new problem. The current episode started in the past 7 days. The problem occurs intermittently. The problem has been unchanged. Associated symptoms include wheezing. Pertinent negatives include no abdominal pain, chest pain, claudication, coryza, ear pain, fever, headaches, hemoptysis, leg pain, leg swelling, neck pain, orthopnea, PND, rash, rhinorrhea, sore throat, sputum production, swollen glands, syncope or vomiting. The symptoms are aggravated by exercise. She has tried nothing for the symptoms.      Review of Systems  Constitutional: Negative for fever, chills, diaphoresis, activity change, appetite change, fatigue and unexpected weight change.  HENT: Negative.  Negative for ear pain, sore throat, rhinorrhea and neck pain.   Eyes: Negative.   Respiratory: Positive for shortness of breath and wheezing. Negative for apnea, cough, hemoptysis, sputum production, choking, chest tightness and stridor.   Cardiovascular: Negative for chest pain, orthopnea, claudication, leg swelling, syncope and PND.  Gastrointestinal: Negative for nausea, vomiting, abdominal pain, diarrhea and constipation.  Genitourinary: Negative.   Musculoskeletal: Negative for myalgias, back pain, joint swelling, arthralgias and gait problem.  Skin: Negative for color change, pallor, rash and wound.  Neurological: Negative for dizziness, tremors, seizures, syncope, facial asymmetry, speech difficulty, weakness, light-headedness, numbness and headaches.  Hematological: Negative for adenopathy. Does not bruise/bleed easily.  Psychiatric/Behavioral: Negative.        Objective:   Physical Exam  Vitals reviewed. Constitutional: She is oriented to person, place, and time. She appears well-developed and well-nourished. No distress.  HENT:  Head: Normocephalic and atraumatic.  Mouth/Throat:  Oropharynx is clear and moist. No oropharyngeal exudate.  Eyes: Conjunctivae normal are normal. Right eye exhibits no discharge. Left eye exhibits no discharge. No scleral icterus.  Neck: Normal range of motion. Neck supple. No JVD present. No tracheal deviation present. No thyromegaly present.  Cardiovascular: Normal rate, regular rhythm, normal heart sounds and intact distal pulses.  Exam reveals no gallop and no friction rub.   No murmur heard. Pulmonary/Chest: Effort normal and breath sounds normal. No stridor. No respiratory distress. She has no wheezes. She has no rales. She exhibits no tenderness.  Abdominal: Soft. Bowel sounds are normal. She exhibits no distension and no mass. There is no tenderness. There is no rebound and no guarding.  Musculoskeletal: Normal range of motion. She exhibits no edema and no tenderness.  Lymphadenopathy:    She has no cervical adenopathy.  Neurological: She is oriented to person, place, and time.  Skin: Skin is warm and dry. No rash noted. She is not diaphoretic. No erythema. No pallor.  Psychiatric: She has a normal mood and affect. Her behavior is normal. Judgment and thought content normal.      Lab Results  Component Value Date   WBC 5.9 04/01/2012   HGB 13.0 04/01/2012   HCT 40.7 04/01/2012   PLT 243.0 04/01/2012   GLUCOSE 123* 04/01/2012   CHOL 153 09/25/2011   TRIG 101.0 09/25/2011   HDL 44.90 09/25/2011   LDLDIRECT 153.9 01/06/2007   LDLCALC 88 09/25/2011   ALT 15 04/01/2012   AST 18 04/01/2012   NA 139 04/01/2012   K 3.6 04/01/2012   CL 103 04/01/2012   CREATININE 0.8 04/01/2012   BUN 10 04/01/2012   CO2 28 04/01/2012   TSH 3.88 04/01/2012   HGBA1C 9.6* 04/01/2012   MICROALBUR 0.8 04/18/2011      Assessment & Plan:

## 2012-07-30 NOTE — Assessment & Plan Note (Addendum)
Her EKG is abnormal with a LBBB, I do not have any old EKG's for comparison, I will check her labs today to see if there is any evidence of ischemia,heart failure,PE Will also check her labs to look for other causes of SOB (anemia,renal,lytes)

## 2012-07-30 NOTE — ED Notes (Signed)
Patient c/o shortness of breath and wheezing with exertion since Monday.  Patient was seen today by Dr. Yetta Barre who sent her here for abnormal labs.

## 2012-07-30 NOTE — Patient Instructions (Signed)

## 2012-07-30 NOTE — ED Notes (Signed)
Pt O2 increased to 10L [place].  Dr. Rubin Payor notified.

## 2012-07-30 NOTE — Assessment & Plan Note (Signed)
I will check her TSH today and will adjust her dose if needed 

## 2012-07-30 NOTE — ED Provider Notes (Signed)
History     CSN: [id number]  Arrival date & time 07/30/12  1603   First MD Initiated Contact with Patient 07/30/12 1619      Chief Complaint  Patient presents with  . Shortness of Breath    (Consider location/radiation/quality/duration/timing/severity/associated sxs/prior treatment) The history is provided by the patient.  patient presents with SOB since Monday. Worse with exertion. Not worse with laying down. No chest pain. Occasional nonproductive cough. No fevers. No chest pain. No swelling in her legs. She was seen by her PCP and had lab work and x-ray done. She was sent in after a positive d-dimer and elevated BNP. No history of CHF. No known cancer  Past Medical History  Diagnosis Date  . Hypothyroidism   . High cholesterol   . Osteoarthritis   . Diabetes mellitus     Past Surgical History  Procedure Date  . Abdominal hysterectomy 1980  . Bmd 2006    Family History  Problem Relation Age of Onset  . Diabetes Other   . Hypertension Other   . Uterine cancer Other     History  Substance Use Topics  . Smoking status: Never Smoker   . Smokeless tobacco: Not on file  . Alcohol Use: No    OB History    Grav Para Term Preterm Abortions TAB SAB Ect Mult Living                  Review of Systems  Constitutional: Negative for activity change and appetite change.  HENT: Negative for neck stiffness.   Eyes: Negative for pain.  Respiratory: Positive for shortness of breath. Negative for chest tightness.   Cardiovascular: Negative for chest pain and leg swelling.  Gastrointestinal: Negative for nausea, vomiting, abdominal pain and diarrhea.  Genitourinary: Negative for flank pain.  Musculoskeletal: Negative for back pain.  Skin: Negative for rash.  Neurological: Negative for weakness, numbness and headaches.  Psychiatric/Behavioral: Negative for behavioral problems.    Allergies  Statins  Home Medications   Current Outpatient Rx  Name  Route  Sig   Dispense  Refill  . ERGOCALCIFEROL 50000 UNITS PO CAPS   Oral   Take 50,000 Units by mouth once a week.         Marland Kitchen GLIMEPIRIDE 4 MG PO TABS   Oral   Take 4 mg by mouth daily before breakfast.         . GLUCOSE BLOOD VI STRP   Other   1 each by Other route as needed. Test twice daily.  Please dispense a 3 month supply         . ACCU-CHEK MULTICLIX LANCETS MISC   Other   1 each by Other route daily. Use as instructed          . LEVOTHYROXINE SODIUM 100 MCG PO TABS   Oral   Take 100 mcg by mouth daily. BRAND MEDICALLY NECESSARY.         Marland Kitchen PITAVASTATIN CALCIUM 4 MG PO TABS   Oral   Take 1 tablet by mouth daily. For cholesterol.         Marland Kitchen SITAGLIPTIN-METFORMIN HCL 50-1000 MG PO TABS   Oral   Take 1 tablet by mouth 2 (two) times daily with a meal.           BP 162/91  Pulse 109  Temp 98.6 F (37 C) (Oral)  Resp 32  Ht 5\' 4"  (1.626 m)  Wt 140 lb (63.504 kg)  BMI 24.03 kg/m2  SpO2 90%  Physical Exam  Constitutional: She appears well-developed and well-nourished.  HENT:  Head: Normocephalic.  Eyes: Conjunctivae normal are normal. Pupils are equal, round, and reactive to light.  Neck: Normal range of motion. Neck supple.  Cardiovascular: Normal rate.   Pulmonary/Chest: Effort normal. She has no wheezes. She exhibits no tenderness.       Mildly harsh breath sounds.  Abdominal: Soft. There is no tenderness.  Musculoskeletal: Normal range of motion. She exhibits no edema.  Skin: Skin is warm. No erythema.    ED Course  Procedures (including critical care time)   Labs Reviewed  TROPONIN I   Ct Angio Chest W/cm &/or Wo Cm  07/30/2012  *RADIOLOGY REPORT*  Clinical Data: Shortness of breath.  Elevated D-dimer.  CT ANGIOGRAPHY CHEST  Technique:  Multidetector CT imaging of the chest using the standard protocol during bolus administration of intravenous contrast. Multiplanar reconstructed images including MIPs were obtained and reviewed to evaluate the  vascular anatomy.  Contrast: 80mL OMNIPAQUE IOHEXOL 350 MG/ML SOLN  Comparison: None.  Findings: Satisfactory opacification of the pulmonary arteries noted, and there is no evidence of pulmonary emboli.  No evidence of thoracic aortic aneurysm.  Poor contrast opacification of thoracic aorta seen, but no definite dissection visualized.  No evidence of mediastinal hematoma or mass.  Moderate right and small left pleural effusions are seen.  Diffuse interstitial infiltrates are seen, consistent with interstitial edema. There is also streaky opacity with air bronchograms in the right middle lobe, and superimposed pneumonia cannot be excluded.  No central endobronchial obstruction identified.  No evidence of hilar or mediastinal lymphadenopathy. Borderline cardiomegaly noted.  IMPRESSION:  1.  No evidence of pulmonary embolism. 2.  Diffuse interstitial infiltrates or edema and bilateral pleural effusions, highly suspicious for congestive heart failure. 3.  Right middle lobe infiltrate versus atelectasis. Superimposed pneumonia cannot be excluded. 4.  No mass or lymphadenopathy identified.   Original Report Authenticated By: Myles Rosenthal, M.D.      1. CHF (congestive heart failure)   2. CAP (community acquired pneumonia)      Date: 07/30/2012  Rate: 104  Rhythm: sinus tachycardia  QRS Axis: normal  Intervals: normal  ST/T Wave abnormalities: nonspecific ST/T changes  Conduction Disutrbances:left bundle branch block  Narrative Interpretation: LBBB is new since 2001  Old EKG Reviewed: changes noted  CRITICAL CARE Performed by: Billee Cashing   Critical care time:30 minutes  Critical care time was exclusive of separately billable procedures and treating other patients.  Critical care was necessary to treat or prevent imminent or life-threatening deterioration.  Critical care was time spent personally by me on the following activities: development of treatment plan with patient and/or surrogate  as well as nursing, discussions with consultants, evaluation of patient's response to treatment, examination of patient, obtaining history from patient or surrogate, ordering and performing treatments and interventions, ordering and review of laboratory studies, ordering and review of radiographic studies, pulse oximetry and re-evaluation of patient's condition.   MDM  Patient presents for shortness of breath. She has an elevated d-dimer. CT angiography was done and showed no PE, but apparent CHF. Also possible pneumonia. Antibiotics were started. After her CT scan patient's only status had decreased. Lasix was given IV and patient was started on nitroglycerin sublingual and IV. He was also started on BiPAP by the admitting Dr. She will be admitted to a stepdown bed.       Juliet Rude. Rubin Payor, MD 07/30/12 1478

## 2012-07-30 NOTE — Assessment & Plan Note (Signed)
I will recheck her a1c and will monitor her renal function

## 2012-07-31 ENCOUNTER — Encounter (HOSPITAL_COMMUNITY): Payer: Self-pay | Admitting: Nurse Practitioner

## 2012-07-31 ENCOUNTER — Inpatient Hospital Stay (HOSPITAL_COMMUNITY): Payer: Medicare Other

## 2012-07-31 DIAGNOSIS — J189 Pneumonia, unspecified organism: Secondary | ICD-10-CM | POA: Diagnosis not present

## 2012-07-31 DIAGNOSIS — J984 Other disorders of lung: Secondary | ICD-10-CM | POA: Diagnosis not present

## 2012-07-31 DIAGNOSIS — R791 Abnormal coagulation profile: Secondary | ICD-10-CM | POA: Diagnosis not present

## 2012-07-31 DIAGNOSIS — I059 Rheumatic mitral valve disease, unspecified: Secondary | ICD-10-CM

## 2012-07-31 DIAGNOSIS — I509 Heart failure, unspecified: Secondary | ICD-10-CM | POA: Diagnosis not present

## 2012-07-31 DIAGNOSIS — J9600 Acute respiratory failure, unspecified whether with hypoxia or hypercapnia: Secondary | ICD-10-CM | POA: Diagnosis not present

## 2012-07-31 LAB — COMPREHENSIVE METABOLIC PANEL
ALT: 12 U/L (ref 0–35)
Albumin: 3.5 g/dL (ref 3.5–5.2)
Alkaline Phosphatase: 45 U/L (ref 39–117)
BUN: 13 mg/dL (ref 6–23)
Chloride: 99 mEq/L (ref 96–112)
Potassium: 3.6 mEq/L (ref 3.5–5.1)
Sodium: 136 mEq/L (ref 135–145)
Total Bilirubin: 0.5 mg/dL (ref 0.3–1.2)
Total Protein: 7.4 g/dL (ref 6.0–8.3)

## 2012-07-31 LAB — CBC
HCT: 34.3 % — ABNORMAL LOW (ref 36.0–46.0)
MCH: 26.8 pg (ref 26.0–34.0)
MCHC: 33.8 g/dL (ref 30.0–36.0)
MCV: 79.2 fL (ref 78.0–100.0)
Platelets: 261 10*3/uL (ref 150–400)
RDW: 15.2 % (ref 11.5–15.5)
WBC: 7 10*3/uL (ref 4.0–10.5)

## 2012-07-31 LAB — SEDIMENTATION RATE: Sed Rate: 42 mm/hr — ABNORMAL HIGH (ref 0–22)

## 2012-07-31 LAB — GLUCOSE, CAPILLARY
Glucose-Capillary: 176 mg/dL — ABNORMAL HIGH (ref 70–99)
Glucose-Capillary: 177 mg/dL — ABNORMAL HIGH (ref 70–99)
Glucose-Capillary: 181 mg/dL — ABNORMAL HIGH (ref 70–99)
Glucose-Capillary: 234 mg/dL — ABNORMAL HIGH (ref 70–99)

## 2012-07-31 MED ORDER — ATORVASTATIN CALCIUM 10 MG PO TABS
10.0000 mg | ORAL_TABLET | Freq: Every day | ORAL | Status: DC
  Administered 2012-08-01: 10 mg via ORAL
  Filled 2012-07-31 (×8): qty 1

## 2012-07-31 MED ORDER — PNEUMOCOCCAL VAC POLYVALENT 25 MCG/0.5ML IJ INJ
0.5000 mL | INJECTION | INTRAMUSCULAR | Status: AC
  Administered 2012-08-01: 0.5 mL via INTRAMUSCULAR
  Filled 2012-07-31: qty 0.5

## 2012-07-31 MED ORDER — BIOTENE DRY MOUTH MT LIQD
15.0000 mL | Freq: Two times a day (BID) | OROMUCOSAL | Status: DC
  Administered 2012-07-31 – 2012-08-07 (×12): 15 mL via OROMUCOSAL

## 2012-07-31 MED ORDER — LISINOPRIL 2.5 MG PO TABS
2.5000 mg | ORAL_TABLET | Freq: Every day | ORAL | Status: DC
  Filled 2012-07-31 (×2): qty 1

## 2012-07-31 NOTE — Progress Notes (Signed)
[id number]/Starr Urias, RN, BSN, CCM: CHART REVIEWED AND UPDATED.  Next chart review due on [id number]. NO DISCHARGE NEEDS PRESENT AT THIS TIME. CASE MANAGEMENT [phone] 

## 2012-07-31 NOTE — Progress Notes (Signed)
  Echocardiogram 2D Echocardiogram has been performed.  Jorje Guild 07/31/2012, 9:46 AM

## 2012-07-31 NOTE — Progress Notes (Signed)
[facility] PROGRESS NOTE  [name]:[id number] DOB: 06/03/1934 DOA: 07/30/2012 PCP: Sanda Linger, MD  Assessment/Plan: 1. Acute respiratory failure - secondary to pulmonary edema- resolved after diuretics, bipap and nitro drip from 12/19 until 12/20  2. CHF - await echocardiogram result, continue diuresis, started low dose acei on 12/20  3. HTN 4. Hypothryoidism - synthroid daily 5. DM - ssi  6. RLL pneumonia - started on empiric levaquin on admission   7. Elevated D dimer - ? Cause , afebrile, will need outpt f/u   Code Status: full Family Communication: son  Disposition Plan: home   Consultants:  none  Procedures:  Bipap  Antibiotics: levaquin  HPI/Subjective: Feels much better   Objective: Filed Vitals:   07/31/12 0300 07/31/12 0400 07/31/12 0500 07/31/12 0600  BP: 125/64 119/65 118/61 123/66  Pulse: 88 83 88 82  Temp:    98.1 F (36.7 C)  TempSrc:    Oral  Resp: 18 14 19 16   Height:      Weight:      SpO2: 96% 93% 94% 92%   Patient Vitals for the past 24 hrs:  BP Temp Temp src Pulse Resp SpO2 Height Weight  07/31/12 0600 123/66 mmHg 98.1 F (36.7 C) Oral 82  16  92 % - -  07/31/12 0500 118/61 mmHg - - 88  19  94 % - -  07/31/12 0400 119/65 mmHg - - 83  14  93 % - -  07/31/12 0300 125/64 mmHg - - 88  18  96 % - -  07/31/12 0200 115/56 mmHg - - 89  18  91 % - -  07/31/12 0100 116/64 mmHg - - 86  15  92 % - -  07/31/12 0000 111/64 mmHg 97.4 F (36.3 C) Oral 85  16  90 % - -  07/30/12 2300 114/63 mmHg - - 87  20  91 % - -  07/30/12 2200 113/66 mmHg - - 87  21  98 % - -  07/30/12 2100 142/63 mmHg - - 95  19  90 % - -  07/30/12 2000 124/65 mmHg 97.9 F (36.6 C) Oral 92  22  99 % 5\' 5"  (1.651 m) 64.9 kg (143 lb 1.3 oz)  07/30/12 1900 105/67 mmHg - - 89  20  95 % - -  07/30/12 1822 - - - 130  40  97 % - -  07/30/12 1733 162/91 mmHg 98.6 F (37 C) - 109  32  90 % - -  07/30/12 1610 141/73 mmHg 98.1 F (36.7 C) Oral 104  20  93 % 5\' 4"   (1.626 m) 63.504 kg (140 lb)     Intake/Output Summary (Last 24 hours) at 07/31/12 0823 Last data filed at 07/31/12 0656  Gross per 24 hour  Intake  201.5 ml  Output   2575 ml  Net -2373.5 ml   Filed Weights   07/30/12 1610 07/30/12 2000  Weight: 63.504 kg (140 lb) 64.9 kg (143 lb 1.3 oz)    Exam:   General:  axox3  Cardiovascular: tachy, reg, s3 gallop  Respiratory: decreased crackles, no wheezes   Abdomen: soft, nt   Data Reviewed: Basic Metabolic Panel:  Lab 07/31/12 2130 07/30/12 1144  NA 136 140  K 3.6 4.0  CL 99 105  CO2 24 25  GLUCOSE 160* 266*  BUN 13 12  CREATININE 0.90 0.9  CALCIUM 9.2 9.7  MG -- --  PHOS -- --  Liver Function Tests:  Lab 07/31/12 0530 07/30/12 1144  AST 16 19  ALT 12 17  ALKPHOS 45 46  BILITOT 0.5 0.8  PROT 7.4 7.9  ALBUMIN 3.5 4.0   No results found for this basename: LIPASE:5,AMYLASE:5 in the last 168 hours No results found for this basename: AMMONIA:5 in the last 168 hours CBC:  Lab 07/31/12 0530 07/30/12 1144  WBC 7.0 5.7  NEUTROABS -- 3.1  HGB 11.6* 12.2  HCT 34.3* 37.8  MCV 79.2 83.0  PLT 261 258.0   Cardiac Enzymes:  Lab 07/31/12 0530 07/30/12 1904 07/30/12 1144  CKTOTAL -- -- 159  CKMB -- -- 3.0  CKMBINDEX -- -- --  TROPONINI <0.30 <0.30 0.02   BNP (last 3 results)  Basename 07/30/12 1144  PROBNP 674.0*   CBG:  Lab 07/31/12 0637 07/30/12 2342 07/30/12 1901  GLUCAP 177* 171* 281*    Recent Results (from the past 240 hour(s))  MRSA PCR SCREENING     Status: Normal   Collection Time   07/30/12  6:49 PM      Component Value Range Status Comment   MRSA by PCR NEGATIVE  NEGATIVE Final      Studies: Ct Angio Chest W/cm &/or Wo Cm  07/30/2012  *RADIOLOGY REPORT*  Clinical Data: Shortness of breath.  Elevated D-dimer.  CT ANGIOGRAPHY CHEST  Technique:  Multidetector CT imaging of the chest using the standard protocol during bolus administration of intravenous contrast. Multiplanar reconstructed  images including MIPs were obtained and reviewed to evaluate the vascular anatomy.  Contrast: 80mL OMNIPAQUE IOHEXOL 350 MG/ML SOLN  Comparison: None.  Findings: Satisfactory opacification of the pulmonary arteries noted, and there is no evidence of pulmonary emboli.  No evidence of thoracic aortic aneurysm.  Poor contrast opacification of thoracic aorta seen, but no definite dissection visualized.  No evidence of mediastinal hematoma or mass.  Moderate right and small left pleural effusions are seen.  Diffuse interstitial infiltrates are seen, consistent with interstitial edema. There is also streaky opacity with air bronchograms in the right middle lobe, and superimposed pneumonia cannot be excluded.  No central endobronchial obstruction identified.  No evidence of hilar or mediastinal lymphadenopathy. Borderline cardiomegaly noted.  IMPRESSION:  1.  No evidence of pulmonary embolism. 2.  Diffuse interstitial infiltrates or edema and bilateral pleural effusions, highly suspicious for congestive heart failure. 3.  Right middle lobe infiltrate versus atelectasis. Superimposed pneumonia cannot be excluded. 4.  No mass or lymphadenopathy identified.   Original Report Authenticated By: Myles Rosenthal, M.D.    Portable Chest 1 View  07/31/2012  *RADIOLOGY REPORT*  Clinical Data: Pulmonary edema.  PORTABLE CHEST - 1 VIEW  Comparison: Chest CT 07/30/2012  Findings: Single view of the chest again demonstrates bibasilar densities.  Findings are suggestive for a combination of pleural fluid and volume loss.  Concern for airspace disease in the right lower lung.  Heart size is within normal limits.  Negative for a pneumothorax.  IMPRESSION: Bibasilar densities.  Findings suggest a combination of pleural fluid and volume loss.  There is also concern for airspace disease in the right lower lung.   Original Report Authenticated By: Richarda Overlie, M.D.     Scheduled Meds:   . aspirin EC  81 mg Oral Daily  . enoxaparin (LOVENOX)  injection  40 mg Subcutaneous Q24H  . furosemide  40 mg Intravenous Q8H  . insulin aspart  0-9 Units Subcutaneous Q6H  . levofloxacin (LEVAQUIN) IV  750 mg Intravenous Q24H  .  levothyroxine  100 mcg Oral QAC breakfast  . sodium chloride  3 mL Intravenous Q12H  . sodium chloride  3 mL Intravenous Q12H   Continuous Infusions:   . 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 If 8PM-8AM, please contact night-coverage at [email or web address], password [facility] 07/31/2012, 8:23 AM  LOS: 1 day

## 2012-08-01 DIAGNOSIS — R791 Abnormal coagulation profile: Secondary | ICD-10-CM | POA: Diagnosis not present

## 2012-08-01 DIAGNOSIS — J189 Pneumonia, unspecified organism: Secondary | ICD-10-CM | POA: Diagnosis not present

## 2012-08-01 DIAGNOSIS — I509 Heart failure, unspecified: Secondary | ICD-10-CM | POA: Diagnosis not present

## 2012-08-01 DIAGNOSIS — J9600 Acute respiratory failure, unspecified whether with hypoxia or hypercapnia: Secondary | ICD-10-CM | POA: Diagnosis not present

## 2012-08-01 MED ORDER — ISOSORBIDE DINITRATE 10 MG PO TABS
10.0000 mg | ORAL_TABLET | Freq: Two times a day (BID) | ORAL | Status: DC
  Administered 2012-08-01: 10 mg via ORAL
  Filled 2012-08-01 (×2): qty 1

## 2012-08-01 MED ORDER — HYDROXYZINE HCL 25 MG PO TABS
25.0000 mg | ORAL_TABLET | Freq: Four times a day (QID) | ORAL | Status: DC | PRN
  Administered 2012-08-01 – 2012-08-03 (×2): 25 mg via ORAL
  Filled 2012-08-01 (×3): qty 1

## 2012-08-01 MED ORDER — LISINOPRIL 5 MG PO TABS
5.0000 mg | ORAL_TABLET | Freq: Every day | ORAL | Status: DC
  Administered 2012-08-02: 5 mg via ORAL
  Filled 2012-08-01: qty 1

## 2012-08-01 MED ORDER — FUROSEMIDE 40 MG PO TABS
40.0000 mg | ORAL_TABLET | Freq: Every day | ORAL | Status: DC
  Administered 2012-08-02: 40 mg via ORAL
  Filled 2012-08-01 (×2): qty 1

## 2012-08-01 MED ORDER — SPIRONOLACTONE 12.5 MG HALF TABLET
12.5000 mg | ORAL_TABLET | Freq: Two times a day (BID) | ORAL | Status: DC
  Administered 2012-08-01 – 2012-08-02 (×2): 12.5 mg via ORAL
  Filled 2012-08-01 (×4): qty 1

## 2012-08-01 MED ORDER — LORAZEPAM 1 MG PO TABS
1.0000 mg | ORAL_TABLET | Freq: Once | ORAL | Status: AC
  Administered 2012-08-01: 1 mg via ORAL
  Filled 2012-08-01: qty 1

## 2012-08-01 MED ORDER — METOPROLOL SUCCINATE 12.5 MG HALF TABLET
12.5000 mg | ORAL_TABLET | Freq: Every day | ORAL | Status: DC
  Administered 2012-08-01: 12.5 mg via ORAL
  Filled 2012-08-01 (×2): qty 1

## 2012-08-01 MED ORDER — LEVOFLOXACIN 750 MG PO TABS
750.0000 mg | ORAL_TABLET | Freq: Every day | ORAL | Status: DC
  Administered 2012-08-01: 750 mg via ORAL
  Filled 2012-08-01 (×3): qty 1

## 2012-08-01 NOTE — Consult Note (Addendum)
CARDIOLOGY CONSULT NOTE    Patient ID: EMMAMARIE KLUENDER MRN: [id number] DOB/AGE: 76-Oct-1935 76 y.o.  Admit date: 07/30/2012 Referring Physician:  Lavera Guise Primary Physician: Sanda Linger, MD Primary Cardiologist:   Reason for Consultation: CHF  Principal Problem:  *Acute respiratory failure Active Problems:  HYPOTHYROIDISM  Type II or unspecified type diabetes mellitus with neurological manifestations, uncontrolled(250.62)  DIABETIC PERIPHERAL NEUROPATHY  VITAMIN D DEFICIENCY  HYPERLIPIDEMIA, MIXED  Elevated d-dimer  Pulmonary edema  LBBB (left bundle branch block)   HPI:  DELILIAH SPRANGER is a 78 y.o. female with history of diabetes, hypothyroidism and hypertension who was sent to the emergency room today by her primary care physician with worsening dyspnea. Patient has had dyspnea since Monday and she also reports wheezing and inability to sleep. Patient denies fever, hemoptysis, sputum production, edema in her legs. She reports that she felt congested in her sinuses. The patient has no prior cardiac history. Today she had blood work done in the office and had a d-dimer of 7 and was referred to the emergency room for evaluation. In the emergency room the patient had a bolus of 500 cc normal saline and a CT angiogram of the chest which was negative for pulmonary emboli but positive for bilateral infiltrates and bilateral pleural effusions  With the fluid and contast bolus she developed worsening pulmonary edema and was appropriately treated by Dr Lavera Guise.  She is much improved today.  Echo showed diffuse hypokinesis with EF 20-25% and ECG with LBBB chronicity not known. She denies SSCP.  No fever, palpitations or syncope.     ROS: All other systems reviewed and negative except as noted above  Past Medical History  Diagnosis Date  . Hypothyroidism   . High cholesterol   . Osteoarthritis   . Diabetes mellitus     Family History  Problem Relation Age of Onset  . Diabetes Other   .  Hypertension Other   . Uterine cancer Other     History   Social History  . Marital Status: Divorced    Spouse Name: N/A    Number of Children: N/A  . Years of Education: N/A   Occupational History  . Retired Engineer, civil (consulting)    Social History Main Topics  . Smoking status: Never Smoker   . Smokeless tobacco: Not on file  . Alcohol Use: No  . Drug Use: No  . Sexually Active: Not Currently   Other Topics Concern  . Not on file   Social History Narrative   Regular Exercise-No    Past Surgical History  Procedure Date  . Abdominal hysterectomy 1980  . Bmd 2006        . antiseptic oral rinse  15 mL Mouth Rinse BID  . aspirin EC  81 mg Oral Daily  . atorvastatin  10 mg Oral q1800  . enoxaparin (LOVENOX) injection  40 mg Subcutaneous Q24H  . furosemide  40 mg Intravenous Q8H  . insulin aspart  0-9 Units Subcutaneous Q6H  . levofloxacin  750 mg Oral QHS  . levothyroxine  100 mcg Oral QAC breakfast  . lisinopril  2.5 mg Oral Daily  . sodium chloride  3 mL Intravenous Q12H  . sodium chloride  3 mL Intravenous Q12H      Physical Exam: Blood pressure 114/64, pulse 82, temperature 98.1 F (36.7 C), temperature source Oral, resp. rate 18, height 5\' 5"  (1.651 m), weight 140 lb 3.4 oz (63.6 kg), SpO2 95.00%.    Affect appropriate Elderly black female  HEENT: normal Neck supple with no adenopathy JVP normal no bruits no thyromegaly Lungs clear with no wheezing and good diaphragmatic motion Heart:  S1/S2 split  no murmur, no rub, gallop or click PMI enlarged Abdomen: benighn, BS positve, no tenderness, no AAA no bruit.  No HSM or HJR Distal pulses intact with no bruits No edema Neuro non-focal Skin warm and dry No muscular weakness   Labs:   Lab Results  Component Value Date   WBC 7.0 07/31/2012   HGB 11.6* 07/31/2012   HCT 34.3* 07/31/2012   MCV 79.2 07/31/2012   PLT 261 07/31/2012    Lab 07/31/12 0530  NA 136  K 3.6  CL 99  CO2 24  BUN 13  CREATININE 0.90    CALCIUM 9.2  PROT 7.4  BILITOT 0.5  ALKPHOS 45  ALT 12  AST 16  GLUCOSE 160*   Lab Results  Component Value Date   CKTOTAL 159 07/30/2012   CKMB 3.0 07/30/2012   TROPONINI <0.30 07/31/2012    Lab Results  Component Value Date   CHOL 153 09/25/2011   CHOL 164 04/18/2011   CHOL 167 11/29/2010   Lab Results  Component Value Date   HDL 44.90 09/25/2011   HDL 91.47 04/18/2011   HDL 45.10 11/29/2010   Lab Results  Component Value Date   LDLCALC 88 09/25/2011   LDLCALC 84 04/18/2011   LDLCALC 102* 11/29/2010   Lab Results  Component Value Date   TRIG 101.0 09/25/2011   TRIG 95.0 04/18/2011   TRIG 99.0 11/29/2010   Lab Results  Component Value Date   CHOLHDL 3 09/25/2011   CHOLHDL 3 04/18/2011   CHOLHDL 4 11/29/2010   Lab Results  Component Value Date   LDLDIRECT 153.9 01/06/2007      Radiology: Ct Angio Chest W/cm &/or Wo Cm  07/30/2012  *RADIOLOGY REPORT*  Clinical Data: Shortness of breath.  Elevated D-dimer.  CT ANGIOGRAPHY CHEST  Technique:  Multidetector CT imaging of the chest using the standard protocol during bolus administration of intravenous contrast. Multiplanar reconstructed images including MIPs were obtained and reviewed to evaluate the vascular anatomy.  Contrast: 80mL OMNIPAQUE IOHEXOL 350 MG/ML SOLN  Comparison: None.  Findings: Satisfactory opacification of the pulmonary arteries noted, and there is no evidence of pulmonary emboli.  No evidence of thoracic aortic aneurysm.  Poor contrast opacification of thoracic aorta seen, but no definite dissection visualized.  No evidence of mediastinal hematoma or mass.  Moderate right and small left pleural effusions are seen.  Diffuse interstitial infiltrates are seen, consistent with interstitial edema. There is also streaky opacity with air bronchograms in the right middle lobe, and superimposed pneumonia cannot be excluded.  No central endobronchial obstruction identified.  No evidence of hilar or mediastinal lymphadenopathy.  Borderline cardiomegaly noted.  IMPRESSION:  1.  No evidence of pulmonary embolism. 2.  Diffuse interstitial infiltrates or edema and bilateral pleural effusions, highly suspicious for congestive heart failure. 3.  Right middle lobe infiltrate versus atelectasis. Superimposed pneumonia cannot be excluded. 4.  No mass or lymphadenopathy identified.   Original Report Authenticated By: Myles Rosenthal, M.D.    Portable Chest 1 View  07/31/2012  *RADIOLOGY REPORT*  Clinical Data: Pulmonary edema.  PORTABLE CHEST - 1 VIEW  Comparison: Chest CT 07/30/2012  Findings: Single view of the chest again demonstrates bibasilar densities.  Findings are suggestive for a combination of pleural fluid and volume loss.  Concern for airspace disease in the right lower lung.  Heart  size is within normal limits.  Negative for a pneumothorax.  IMPRESSION: Bibasilar densities.  Findings suggest a combination of pleural fluid and volume loss.  There is also concern for airspace disease in the right lower lung.   Original Report Authenticated By: Richarda Overlie, M.D.     EKG:  SR LBBB   ASSESSMENT AND PLAN:  CHF:  Clinical presentation is that of non-ischemic DCM with systolic CHF exacerbated by fluid and contrast load.  She is currently negative 3.8 liters  Switch to PO diuretics in a. And increase ACE  Add nitrates and consider coreg as outpatient.  Low likelyhood of CAD with this clinical presentation and if she continues to  Improve no indication for cath.  Post D/C would see in office and in 3-4 weeks on Rx do adenosine myovue given LBBB.   Chol:  Continue statin Pulm:  Probably ok to D/C antibiotics but will leave this up to primary service  Signed: Charlton Haws 08/01/2012, 2:05 PM

## 2012-08-01 NOTE — Progress Notes (Signed)
[facility] PROGRESS NOTE  CHANCY CLAROS WUJ:[id number] DOB: 1934/07/21 DOA: 07/30/2012 PCP: Sanda Linger, MD  Assessment/Plan: 1. Acute respiratory failure - secondary to pulmonary edema- resolved after diuretics, bipap and nitro drip from 12/19 until 12/20 .  2. CHF - echocardiogram result indicating severe depressed EF - patient started on diuretics on admission and she improved symptoms. Cardiology saw patient on 08/03/12 and did not recommend cath . Etiology is unclear . Plan for treatment with oral furosemide, acei , spironolactone, lisinopril. Follow creatinine closely.   3. HTN 4. Hypothryoidism - synthroid daily 5. DM - ssi for now. May resume oral meds at DC 6. RLL pneumonia - started on empiric levaquin on admission   7. Elevated D dimer - ? Cause , afebrile, will need outpt f/u . Rheum factor highly elevated, ana pending - may need rheum outpt f/u   Code Status: full Family Communication: son  Disposition Plan: home   Consultants: [facility]   Procedures:  Bipap  Antibiotics: levaquin  HPI/Subjective: Feels much better   Objective: Filed Vitals:   07/31/12 1435 07/31/12 2103 08/01/12 0500 08/01/12 0544  BP: 126/73 119/64  114/64  Pulse: 99 87  82  Temp: 98.4 F (36.9 C) 98.9 F (37.2 C)  98.1 F (36.7 C)  TempSrc: Oral Oral  Oral  Resp: 18 20  18   Height:      Weight:   63.6 kg (140 lb 3.4 oz)   SpO2: 97% 95%     Patient Vitals for the past 24 hrs:  BP Temp Temp src Pulse Resp SpO2 Weight  08/01/12 0544 114/64 mmHg 98.1 F (36.7 C) Oral 82  18  - -  08/01/12 0500 - - - - - - 63.6 kg (140 lb 3.4 oz)  07/31/12 2103 119/64 mmHg 98.9 F (37.2 C) Oral 87  20  95 % -  07/31/12 1435 126/73 mmHg 98.4 F (36.9 C) Oral 99  18  97 % -  07/31/12 1236 118/66 mmHg - - - - - -  07/31/12 1200 - 99.3 F (37.4 C) Axillary - - - -  07/31/12 1123 118/66 mmHg - - - 19  97 % -     Intake/Output Summary (Last 24 hours) at 08/01/12 0849 Last data  filed at 08/01/12 0546  Gross per 24 hour  Intake    583 ml  Output   2300 ml  Net  -1717 ml   Filed Weights   07/30/12 1610 07/30/12 2000 08/01/12 0500  Weight: 63.504 kg (140 lb) 64.9 kg (143 lb 1.3 oz) 63.6 kg (140 lb 3.4 oz)    Exam:   General:  axox3  Cardiovascular: tachy, reg, s3 gallop  Respiratory: decreased crackles, no wheezes   Abdomen: soft, nt   Data Reviewed: Basic Metabolic Panel:  Lab 07/31/12 9562 07/30/12 1144  NA 136 140  K 3.6 4.0  CL 99 105  CO2 24 25  GLUCOSE 160* 266*  BUN 13 12  CREATININE 0.90 0.9  CALCIUM 9.2 9.7  MG -- --  PHOS -- --   Liver Function Tests:  Lab 07/31/12 0530 07/30/12 1144  AST 16 19  ALT 12 17  ALKPHOS 45 46  BILITOT 0.5 0.8  PROT 7.4 7.9  ALBUMIN 3.5 4.0   No results found for this basename: LIPASE:5,AMYLASE:5 in the last 168 hours No results found for this basename: AMMONIA:5 in the last 168 hours CBC:  Lab 07/31/12 0530 07/30/12 1144  WBC 7.0 5.7  NEUTROABS --  3.1  HGB 11.6* 12.2  HCT 34.3* 37.8  MCV 79.2 83.0  PLT 261 258.0   Cardiac Enzymes:  Lab 07/31/12 0530 07/30/12 1904 07/30/12 1144  CKTOTAL -- -- 159  CKMB -- -- 3.0  CKMBINDEX -- -- --  TROPONINI <0.30 <0.30 0.02   BNP (last 3 results)  Basename 07/30/12 1144  PROBNP 674.0*   CBG:  Lab 08/01/12 0542 07/31/12 2350 07/31/12 1644 07/31/12 1151 07/31/12 0637  GLUCAP 147* 234* 181* 176* 177*    Recent Results (from the past 240 hour(s))  MRSA PCR SCREENING     Status: Normal   Collection Time   07/30/12  6:49 PM      Component Value Range Status Comment   MRSA by PCR NEGATIVE  NEGATIVE Final      Studies: Ct Angio Chest W/cm &/or Wo Cm  07/30/2012  *RADIOLOGY REPORT*  Clinical Data: Shortness of breath.  Elevated D-dimer.  CT ANGIOGRAPHY CHEST  Technique:  Multidetector CT imaging of the chest using the standard protocol during bolus administration of intravenous contrast. Multiplanar reconstructed images including MIPs were  obtained and reviewed to evaluate the vascular anatomy.  Contrast: 80mL OMNIPAQUE IOHEXOL 350 MG/ML SOLN  Comparison: None.  Findings: Satisfactory opacification of the pulmonary arteries noted, and there is no evidence of pulmonary emboli.  No evidence of thoracic aortic aneurysm.  Poor contrast opacification of thoracic aorta seen, but no definite dissection visualized.  No evidence of mediastinal hematoma or mass.  Moderate right and small left pleural effusions are seen.  Diffuse interstitial infiltrates are seen, consistent with interstitial edema. There is also streaky opacity with air bronchograms in the right middle lobe, and superimposed pneumonia cannot be excluded.  No central endobronchial obstruction identified.  No evidence of hilar or mediastinal lymphadenopathy. Borderline cardiomegaly noted.  IMPRESSION:  1.  No evidence of pulmonary embolism. 2.  Diffuse interstitial infiltrates or edema and bilateral pleural effusions, highly suspicious for congestive heart failure. 3.  Right middle lobe infiltrate versus atelectasis. Superimposed pneumonia cannot be excluded. 4.  No mass or lymphadenopathy identified.   Original Report Authenticated By: Myles Rosenthal, M.D.    Portable Chest 1 View  07/31/2012  *RADIOLOGY REPORT*  Clinical Data: Pulmonary edema.  PORTABLE CHEST - 1 VIEW  Comparison: Chest CT 07/30/2012  Findings: Single view of the chest again demonstrates bibasilar densities.  Findings are suggestive for a combination of pleural fluid and volume loss.  Concern for airspace disease in the right lower lung.  Heart size is within normal limits.  Negative for a pneumothorax.  IMPRESSION: Bibasilar densities.  Findings suggest a combination of pleural fluid and volume loss.  There is also concern for airspace disease in the right lower lung.   Original Report Authenticated By: Richarda Overlie, M.D.     Scheduled Meds:    . antiseptic oral rinse  15 mL Mouth Rinse BID  . aspirin EC  81 mg Oral Daily   . atorvastatin  10 mg Oral q1800  . enoxaparin (LOVENOX) injection  40 mg Subcutaneous Q24H  . furosemide  40 mg Intravenous Q8H  . insulin aspart  0-9 Units Subcutaneous Q6H  . levofloxacin  750 mg Oral QHS  . levothyroxine  100 mcg Oral QAC breakfast  . lisinopril  2.5 mg Oral Daily  . pneumococcal 23 valent vaccine  0.5 mL Intramuscular Tomorrow-1000  . sodium chloride  3 mL Intravenous Q12H  . sodium chloride  3 mL Intravenous Q12H   Continuous Infusions:  Principal Problem:  *Acute respiratory failure Active Problems:  Pulmonary edema  HYPOTHYROIDISM  Type II or unspecified type diabetes mellitus with neurological manifestations, uncontrolled(250.62)  DIABETIC PERIPHERAL NEUROPATHY  VITAMIN D DEFICIENCY  HYPERLIPIDEMIA, MIXED  Elevated d-dimer  LBBB (left bundle branch block)    Miyana Mordecai  [facility] Pager [phone]. If 8PM-8AM, please contact night-coverage at [email or web address], password [facility] 08/01/2012, 8:49 AM  LOS: 2 days

## 2012-08-01 NOTE — Progress Notes (Signed)
PHARMACIST - PHYSICIAN COMMUNICATION DR:   Lavera Guise CONCERNING: Antibiotic IV to Oral Route Change Policy  RECOMMENDATION: This patient is receiving Levaquin by the intravenous route.  Based on criteria approved by the Pharmacy and Therapeutics Committee, the antibiotic(s) is/are being converted to the equivalent oral dose form(s).   DESCRIPTION: These criteria include:  Patient being treated for a respiratory tract infection, urinary tract infection, or cellulitis  The patient is not neutropenic and does not exhibit a GI malabsorption state  The patient is eating (either orally or via tube) and/or has been taking other orally administered medications for a least 24 hours  The patient is improving clinically and has a Tmax < 100.5  If you have questions about this conversion, please contact the Pharmacy Department  []   [phone] )  [facility] []   [phone] )  [facility]  []   [phone] )  [facility] [x]   [phone] )  [facility]    Hessie Knows, PharmD, [place] Pager [phone] 08/01/2012 8:45 AM

## 2012-08-01 NOTE — Progress Notes (Signed)
Pt refused lisinopril, wants MD to explain why she needs it, will alert MD

## 2012-08-02 DIAGNOSIS — I5021 Acute systolic (congestive) heart failure: Secondary | ICD-10-CM | POA: Diagnosis not present

## 2012-08-02 DIAGNOSIS — I420 Dilated cardiomyopathy: Secondary | ICD-10-CM | POA: Diagnosis present

## 2012-08-02 DIAGNOSIS — I509 Heart failure, unspecified: Secondary | ICD-10-CM | POA: Diagnosis not present

## 2012-08-02 DIAGNOSIS — I428 Other cardiomyopathies: Secondary | ICD-10-CM | POA: Diagnosis not present

## 2012-08-02 DIAGNOSIS — I10 Essential (primary) hypertension: Secondary | ICD-10-CM

## 2012-08-02 DIAGNOSIS — J9600 Acute respiratory failure, unspecified whether with hypoxia or hypercapnia: Secondary | ICD-10-CM | POA: Diagnosis not present

## 2012-08-02 LAB — BASIC METABOLIC PANEL
BUN: 20 mg/dL (ref 6–23)
GFR calc non Af Amer: 46 mL/min — ABNORMAL LOW (ref >90)
Glucose, Bld: 153 mg/dL — ABNORMAL HIGH (ref 70–99)
Potassium: 3.7 mEq/L (ref 3.5–5.1)

## 2012-08-02 LAB — GLUCOSE, CAPILLARY
Glucose-Capillary: 162 mg/dL — ABNORMAL HIGH (ref 70–99)
Glucose-Capillary: 293 mg/dL — ABNORMAL HIGH (ref 70–99)
Glucose-Capillary: 98 mg/dL (ref 70–99)
Glucose-Capillary: 161 mg/dL — ABNORMAL HIGH (ref 70–99)

## 2012-08-02 MED ORDER — CARVEDILOL 3.125 MG PO TABS
3.1250 mg | ORAL_TABLET | Freq: Two times a day (BID) | ORAL | Status: DC
  Filled 2012-08-02 (×2): qty 1

## 2012-08-02 MED ORDER — HYDROCODONE-ACETAMINOPHEN 5-325 MG PO TABS: 1.0000 | ORAL_TABLET | Freq: Four times a day (QID) | ORAL | Status: DC | PRN

## 2012-08-02 NOTE — Progress Notes (Signed)
Pt stood to walk and felt dizzy, manual BP was 94/50, [facility] and cardiology notified, see new orders. Instructed pt to stay in bed and enc  Po fluids per md recommendation

## 2012-08-02 NOTE — Progress Notes (Signed)
Spoke with case Production designer, theatre/television/film. She will arrange pts home health. We may go ahead and dc pt

## 2012-08-02 NOTE — Progress Notes (Addendum)
Patient ID: [name] [name], female   DOB: 1934/03/13, 78 y.o.   MRN: [id number]   SUBJECTIVE:  Consult note done yesterday. Patient is comfortable today. She is relaxing flat in bed. She's not having any shortness of breath.   Filed Vitals:   08/01/12 0544 08/01/12 1500 08/01/12 2240 08/02/12 0616  BP: 114/64 106/62 110/61 110/67  Pulse: 82 88 82 80  Temp: 98.1 F (36.7 C) 98.9 F (37.2 C) 98.4 F (36.9 C) 98.6 F (37 C)  TempSrc: Oral Oral Oral Oral  Resp: 18 20 18 16   Height:      Weight:    134 lb 7.7 oz (61 kg)  SpO2:  94% 92% 94%    Intake/Output Summary (Last 24 hours) at 08/02/12 0738 Last data filed at 08/02/12 1191  Gross per 24 hour  Intake    660 ml  Output   1250 ml  Net   -590 ml    LABS: Basic Metabolic Panel:  Basename 08/02/12 0434 07/31/12 0530  NA 136 136  K 3.7 3.6  CL 96 99  CO2 28 24  GLUCOSE 153* 160*  BUN 20 13  CREATININE 1.13* 0.90  CALCIUM 9.3 9.2  MG -- --  PHOS -- --   Liver Function Tests:  Basename 07/31/12 0530 07/30/12 1144  AST 16 19  ALT 12 17  ALKPHOS 45 46  BILITOT 0.5 0.8  PROT 7.4 7.9  ALBUMIN 3.5 4.0   No results found for this basename: LIPASE:2,AMYLASE:2 in the last 72 hours CBC:  Basename 07/31/12 0530 07/30/12 1144  WBC 7.0 5.7  NEUTROABS -- 3.1  HGB 11.6* 12.2  HCT 34.3* 37.8  MCV 79.2 83.0  PLT 261 258.0   Cardiac Enzymes:  Basename 07/31/12 0530 07/30/12 1904 07/30/12 1144  CKTOTAL -- -- 159  CKMB -- -- 3.0  CKMBINDEX -- -- --  TROPONINI <0.30 <0.30 0.02   BNP: No components found with this basename: POCBNP:3 D-Dimer:  Basename 07/30/12 1144  DDIMER 7.11*   Hemoglobin A1C:  Basename 07/30/12 1144  HGBA1C 8.3*   Fasting Lipid Panel: No results found for this basename: CHOL,HDL,LDLCALC,TRIG,CHOLHDL,LDLDIRECT in the last 72 hours Thyroid Function Tests:  Basename 07/30/12 1144  TSH 3.64  T4TOTAL --  T3FREE --  THYROIDAB --    RADIOLOGY: Ct Angio Chest W/cm &/or Wo  Cm  07/30/2012  *RADIOLOGY REPORT*  Clinical Data: Shortness of breath.  Elevated D-dimer.  CT ANGIOGRAPHY CHEST  Technique:  Multidetector CT imaging of the chest using the standard protocol during bolus administration of intravenous contrast. Multiplanar reconstructed images including MIPs were obtained and reviewed to evaluate the vascular anatomy.  Contrast: 80mL OMNIPAQUE IOHEXOL 350 MG/ML SOLN  Comparison: None.  Findings: Satisfactory opacification of the pulmonary arteries noted, and there is no evidence of pulmonary emboli.  No evidence of thoracic aortic aneurysm.  Poor contrast opacification of thoracic aorta seen, but no definite dissection visualized.  No evidence of mediastinal hematoma or mass.  Moderate right and small left pleural effusions are seen.  Diffuse interstitial infiltrates are seen, consistent with interstitial edema. There is also streaky opacity with air bronchograms in the right middle lobe, and superimposed pneumonia cannot be excluded.  No central endobronchial obstruction identified.  No evidence of hilar or mediastinal lymphadenopathy. Borderline cardiomegaly noted.  IMPRESSION:  1.  No evidence of pulmonary embolism. 2.  Diffuse interstitial infiltrates or edema and bilateral pleural effusions, highly suspicious for congestive heart failure. 3.  Right middle lobe infiltrate versus  atelectasis. Superimposed pneumonia cannot be excluded. 4.  No mass or lymphadenopathy identified.   Original Report Authenticated By: Myles Rosenthal, M.D.    Portable Chest 1 View  07/31/2012  *RADIOLOGY REPORT*  Clinical Data: Pulmonary edema.  PORTABLE CHEST - 1 VIEW  Comparison: Chest CT 07/30/2012  Findings: Single view of the chest again demonstrates bibasilar densities.  Findings are suggestive for a combination of pleural fluid and volume loss.  Concern for airspace disease in the right lower lung.  Heart size is within normal limits.  Negative for a pneumothorax.  IMPRESSION: Bibasilar  densities.  Findings suggest a combination of pleural fluid and volume loss.  There is also concern for airspace disease in the right lower lung.   Original Report Authenticated By: Richarda Overlie, M.D.     PHYSICAL EXAM  Patient is comfortable in bed. She is oriented to person time and place. Affect is normal. Lungs are clear. There is no respiratory distress. Cardiac exam reveals S1 and S2. There no clicks or significant murmurs. The abdomen is soft. There is no peripheral edema.   TELEMETRY: I reviewed telemetry today August 02, 2012. There is sinus rhythm with left bundle-branch block.   ASSESSMENT AND PLAN:   *Acute respiratory failure   Improved   LBBB (left bundle branch block)  Acute systolic CHF    Improving. I have switched her to carvedilol.   [name] 08/02/2012 7:38 AM

## 2012-08-02 NOTE — Progress Notes (Signed)
[facility] PROGRESS NOTE  [name] [name] XLK:[id number] DOB: Oct 28, 1933 DOA: 07/30/2012 PCP: Sanda Linger, MD  Brief narrative 78 y.o. female with history of diabetes, hypothyroidism and hypertension who was sent to the emergency room on 12/19 by her primary care physician with worsening dyspnea. The patient has no prior cardiac history. She had elevated d-dimer but CTA chest was negative for PE. In the ED, patient was found to be tachypneic with sats dropping into the 70s on 10 L nasal cannula. She received Lasix 40 mg IV, morphine 2 mg IV and, initiated BiPAP for respiratory support. Patient was also placed on a nitroglycerin drip by the emergency room physician.   Assessment/Plan:  Acute respiratory failure  secondary to pulmonary edema- resolved after diuretics, bipap and nitro drip from 12/19 until 12/20 . And possible pneumonia.  CHF, acute systolic  echocardiogram result indicating severe depressed EF  patient started on diuretics on admission and symptoms have resolved.   Cardiology consulted and did not recommend cath . Etiology is unclear .   Plan for treatment with oral furosemide, spironolactone, lisinopril.   Follow creatinine closely- slightly higher.    Cardiomyopathy  EF 20-25% with diffuse hypokinesis.  Cardiology following.  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 Possible DC an outpatient followup with them in 3-4 weeks for Adenosine Myoview. (See Dr. Fabio Bering 12/21 note)  HTN  Controlled  Hypothyroidism  synthroid daily  DM   ssi for now. May resume oral meds at DC  RLL pneumonia   started on empiric levaquin on admission.  Complete 7 days of antibiotics.  Followup chest x-ray as outpatient in a couple of weeks.    Elevated D dimer   ?Cause , afebrile, will need outpt f/u .   Rheum factor highly  elevated, ana pending - may need rheum outpt f/u   Anemia  Follow CBCs.  Code Status: Full Family Communication: Discussed with patient. Disposition Plan: home   Consultants: [facility]   Procedures:  Bipap  Antibiotics:  Rocephin IV x1 dose on 12/19  Levaquin IV 12/19-12/20  By mouth Levaquin 12/21 >  HPI/Subjective: Denies complaints. Ambulating in halls without dyspnea, chest pain or cough.  Objective: Filed Vitals:   08/01/12 0544 08/01/12 1500 08/01/12 2240 08/02/12 0616  BP: 114/64 106/62 110/61 110/67  Pulse: 82 88 82 80  Temp: 98.1 F (36.7 C) 98.9 F (37.2 C) 98.4 F (36.9 C) 98.6 F (37 C)  TempSrc: Oral Oral Oral Oral  Resp: 18 20 18 16   Height:      Weight:    61 kg (134 lb 7.7 oz)  SpO2:  94% 92% 94%      Intake/Output Summary (Last 24 hours) at 08/02/12 1259 Last data filed at 08/02/12 0900  Gross per 24 hour  Intake    660 ml  Output   1250 ml  Net   -590 ml   Filed Weights   07/30/12 2000 08/01/12 0500 08/02/12 0616  Weight: 64.9 kg (143 lb 1.3 oz) 63.6 kg (140 lb 3.4 oz) 61 kg (134 lb 7.7 oz)    Exam:   General exam: Comfortable.  Respiratory system: Occasional basal crackles but otherwise clear to auscultation bilaterally. No increased work of breathing.  Cardiovascular system: First and second heart sounds heard, regular rate and rhythm. No JVD, murmurs or pedal edema. Telemetry shows sinus rhythm with wide QRS.  Gastrointestinal system: Abdomen is nondistended, soft and nontender. Normal bowel sounds heard.  Central nervous system: Alert and oriented. No focal neurological deficits.  Extremities: Symmetric 5 x 5 power.   Data Reviewed: Basic Metabolic Panel:  Lab 08/02/12 5366 07/31/12 0530 07/30/12 1144  NA 136 136 140  K 3.7 3.6 4.0  CL 96 99 105  CO2 28 24 25   GLUCOSE 153* 160* 266*  BUN 20 13 12   CREATININE 1.13* 0.90 0.9  CALCIUM 9.3 9.2 9.7  MG -- -- --  PHOS -- -- --   Liver Function  Tests:  Lab 07/31/12 0530 07/30/12 1144  AST 16 19  ALT 12 17  ALKPHOS 45 46  BILITOT 0.5 0.8  PROT 7.4 7.9  ALBUMIN 3.5 4.0   No results found for this basename: LIPASE:5,AMYLASE:5 in the last 168 hours No results found for this basename: AMMONIA:5 in the last 168 hours CBC:  Lab 07/31/12 0530 07/30/12 1144  WBC 7.0 5.7  NEUTROABS -- 3.1  HGB 11.6* 12.2  HCT 34.3* 37.8  MCV 79.2 83.0  PLT 261 258.0   Cardiac Enzymes:  Lab 07/31/12 0530 07/30/12 1904 07/30/12 1144  CKTOTAL -- -- 159  CKMB -- -- 3.0  CKMBINDEX -- -- --  TROPONINI <0.30 <0.30 0.02   BNP (last 3 results)  Basename 07/30/12 1144  PROBNP 674.0*   CBG:  Lab 08/02/12 1134 08/02/12 0525 08/02/12 0013 08/01/12 1739 08/01/12 1226  GLUCAP 293* 162* 245* 190* 231*    Recent Results (from the past 240 hour(s))  MRSA PCR SCREENING     Status: Normal   Collection Time   07/30/12  6:49 PM      Component Value Range Status Comment   MRSA by PCR NEGATIVE  NEGATIVE Final     Other lab data  Hemoglobin A1c: 8.3  TSH: 3.64  ANA negative  Rheumatoid factor: Greater than 600.  2-D echo Study Conclusions  - Left ventricle: The cavity size was normal. Wall thickness was increased in a pattern of mild LVH. Systolic function was severely reduced. The estimated ejection fraction was in the range of 20% to 25%. Diffuse hypokinesis. Doppler parameters are consistent with abnormal left ventricular relaxation (grade 1 diastolic dysfunction). - Ventricular septum: Septal motion showed paradox. - Mitral valve: Moderate regurgitation. - Left atrium: The atrium was mildly dilated.   Studies: No results found.  Scheduled Meds:    . antiseptic oral rinse  15 mL Mouth Rinse BID  . aspirin EC  81 mg Oral Daily  . atorvastatin  10 mg Oral q1800  . carvedilol  3.125 mg Oral BID WC  . enoxaparin (LOVENOX) injection  40 mg Subcutaneous Q24H  . furosemide  40 mg Oral Daily  . insulin aspart  0-9 Units Subcutaneous  Q6H  . levofloxacin  750 mg Oral QHS  . levothyroxine  100 mcg Oral QAC breakfast  . lisinopril  5 mg Oral Daily  . sodium chloride  3 mL Intravenous Q12H  . sodium chloride  3 mL Intravenous Q12H  . 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 If 8PM-8AM, please contact night-coverage at [email or web address], password [facility] 08/02/2012,  12:59 PM  LOS: 3 days

## 2012-08-02 NOTE — Progress Notes (Signed)
Manual SBP still 95, but pt is less symptomatic, stood to [facility] and denies dizziness, but does feel weak.  Bed alarm on for safety

## 2012-08-02 NOTE — Progress Notes (Signed)
Called by RN patient c/o lightheadedness on walking to the bathroom. BP recorded at 94/50. Reviewing clinical course, she has been started on several beneficial medications for newly diagnosed systolic CHF including Lisinopril 10 daily, Coreg 3.125 BID and spironolactone 12.5 BID. Suspect the additive antihypertensive effect is at play. Will hold these altogether and address carefully re-initiating tomorrow. Continue to monitor BP and symptoms.   Jacqulyn Bath, PA-C 08/02/2012 2:28 PM

## 2012-08-03 ENCOUNTER — Inpatient Hospital Stay (HOSPITAL_COMMUNITY): Payer: Medicare Other

## 2012-08-03 DIAGNOSIS — J189 Pneumonia, unspecified organism: Secondary | ICD-10-CM | POA: Diagnosis not present

## 2012-08-03 DIAGNOSIS — Z452 Encounter for adjustment and management of vascular access device: Secondary | ICD-10-CM | POA: Diagnosis not present

## 2012-08-03 DIAGNOSIS — N179 Acute kidney failure, unspecified: Secondary | ICD-10-CM | POA: Diagnosis not present

## 2012-08-03 DIAGNOSIS — I959 Hypotension, unspecified: Secondary | ICD-10-CM

## 2012-08-03 DIAGNOSIS — I5021 Acute systolic (congestive) heart failure: Secondary | ICD-10-CM | POA: Diagnosis not present

## 2012-08-03 DIAGNOSIS — J9600 Acute respiratory failure, unspecified whether with hypoxia or hypercapnia: Secondary | ICD-10-CM | POA: Diagnosis not present

## 2012-08-03 DIAGNOSIS — I509 Heart failure, unspecified: Secondary | ICD-10-CM | POA: Diagnosis not present

## 2012-08-03 DIAGNOSIS — J811 Chronic pulmonary edema: Secondary | ICD-10-CM | POA: Diagnosis not present

## 2012-08-03 DIAGNOSIS — J984 Other disorders of lung: Secondary | ICD-10-CM | POA: Diagnosis not present

## 2012-08-03 LAB — CBC
HCT: 36.1 % (ref 36.0–46.0)
Hemoglobin: 11.9 g/dL — ABNORMAL LOW (ref 12.0–15.0)
MCH: 26.4 pg (ref 26.0–34.0)
MCHC: 33 g/dL (ref 30.0–36.0)
MCV: 80.2 fL (ref 78.0–100.0)

## 2012-08-03 LAB — PRO B NATRIURETIC PEPTIDE: Pro B Natriuretic peptide (BNP): 2238 pg/mL — ABNORMAL HIGH (ref 0–450)

## 2012-08-03 LAB — BASIC METABOLIC PANEL
BUN: 32 mg/dL — ABNORMAL HIGH (ref 6–23)
CO2: 22 mEq/L (ref 19–32)
GFR calc non Af Amer: 14 mL/min — ABNORMAL LOW (ref >90)
Glucose, Bld: 125 mg/dL — ABNORMAL HIGH (ref 70–99)
Potassium: 3.8 mEq/L (ref 3.5–5.1)

## 2012-08-03 LAB — GLUCOSE, CAPILLARY
Glucose-Capillary: 145 mg/dL — ABNORMAL HIGH (ref 70–99)
Glucose-Capillary: 196 mg/dL — ABNORMAL HIGH (ref 70–99)

## 2012-08-03 LAB — URINALYSIS, ROUTINE W REFLEX MICROSCOPIC
Leukocytes, UA: NEGATIVE
Nitrite: NEGATIVE
Specific Gravity, Urine: 1.009 (ref 1.005–1.030)
Urobilinogen, UA: 0.2 mg/dL (ref 0.0–1.0)
pH: 5 (ref 5.0–8.0)

## 2012-08-03 MED ORDER — HYDROCODONE-ACETAMINOPHEN 5-325 MG PO TABS: 1.0000 | ORAL_TABLET | Freq: Four times a day (QID) | ORAL | Status: DC | PRN

## 2012-08-03 MED ORDER — SODIUM CHLORIDE 0.9 % IV BOLUS (SEPSIS)
250.0000 mL | Freq: Once | INTRAVENOUS | Status: AC
  Administered 2012-08-03: 250 mL via INTRAVENOUS

## 2012-08-03 MED ORDER — ENOXAPARIN SODIUM 30 MG/0.3ML ~~LOC~~ SOLN
30.0000 mg | SUBCUTANEOUS | Status: DC
  Administered 2012-08-03: 30 mg via SUBCUTANEOUS
  Filled 2012-08-03 (×2): qty 0.3

## 2012-08-03 MED ORDER — SODIUM CHLORIDE 0.9 % IV BOLUS (SEPSIS)
500.0000 mL | Freq: Once | INTRAVENOUS | Status: AC
  Administered 2012-08-03: 500 mL via INTRAVENOUS

## 2012-08-03 MED ORDER — SODIUM CHLORIDE 0.9 % IV SOLN: 750.0000 mL | INTRAVENOUS | Status: DC | PRN

## 2012-08-03 MED ORDER — LEVOFLOXACIN 500 MG PO TABS
500.0000 mg | ORAL_TABLET | ORAL | Status: DC
  Filled 2012-08-03: qty 1

## 2012-08-03 MED ORDER — DOPAMINE-DEXTROSE 3.2-5 MG/ML-% IV SOLN
2.0000 ug/kg/min | INTRAVENOUS | Status: DC
  Administered 2012-08-03: 5 ug/kg/min via INTRAVENOUS
  Filled 2012-08-03: qty 250

## 2012-08-03 MED ORDER — METOCLOPRAMIDE HCL 5 MG/ML IJ SOLN
10.0000 mg | Freq: Once | INTRAMUSCULAR | Status: AC
  Administered 2012-08-03: 10 mg via INTRAVENOUS
  Filled 2012-08-03: qty 2

## 2012-08-03 MED ORDER — SODIUM CHLORIDE 0.9 % IV SOLN: 250.0000 mL | INTRAVENOUS | Status: AC

## 2012-08-03 MED ORDER — SODIUM CHLORIDE 0.9 % IV SOLN: 250.0000 mL | INTRAVENOUS | Status: DC

## 2012-08-03 NOTE — Progress Notes (Addendum)
Pt BP unchanged after 500cc bolus (78/48). MD notified, new orders received to bolus with 250cc and to d/c lasix and BP medications. Will continue to  Monitor.   Earnest Conroy. Clelia Croft, RN

## 2012-08-03 NOTE — Progress Notes (Signed)
Pt had 4 beats V-tach on monitor. BP 82/43 manual, MD notified, New orders for 500cc bolus received.  Earnest Conroy. Clelia Croft, RN

## 2012-08-03 NOTE — Progress Notes (Signed)
TELEMETRY: Reviewed telemetry pt in NSR, one 4 beat run NSVT: Filed Vitals:   08/03/12 0531 08/03/12 0532 08/03/12 0606 08/03/12 0613  BP: 78/48 81/42 84/41  84/42  Pulse:   71   Temp:   98.6 F (37 C)   TempSrc:   Oral   Resp:   18   Height:      Weight:    139 lb 1.8 oz (63.1 kg)  SpO2:   97%     Intake/Output Summary (Last 24 hours) at 08/03/12 0724 Last data filed at 08/03/12 1610  Gross per 24 hour  Intake   1720 ml  Output    625 ml  Net   1095 ml    SUBJECTIVE Patient reports she had a terrible night. Felt woozy and dizzy with low BP. Nausea-better after IV Zofran. Denies SOB or chest pain. Generally doesn't feel well.  LABS: Basic Metabolic Panel:  Basename 08/03/12 0443 08/02/12 0434  NA 132* 136  K 3.8 3.7  CL 94* 96  CO2 22 28  GLUCOSE 125* 153*  BUN 32* 20  CREATININE 3.11* 1.13*  CALCIUM 8.9 9.3  MG -- --  PHOS -- --   Liver Function Tests: No results found for this basename: AST:2,ALT:2,ALKPHOS:2,BILITOT:2,PROT:2,ALBUMIN:2 in the last 72 hours No results found for this basename: LIPASE:2,AMYLASE:2 in the last 72 hours CBC:  Basename 08/03/12 0443  WBC 6.5  NEUTROABS --  HGB 11.9*  HCT 36.1  MCV 80.2  PLT 250    Radiology/Studies:  Ct Angio Chest W/cm &/or Wo Cm  07/30/2012  *RADIOLOGY REPORT*  Clinical Data: Shortness of breath.  Elevated D-dimer.  CT ANGIOGRAPHY CHEST  Technique:  Multidetector CT imaging of the chest using the standard protocol during bolus administration of intravenous contrast. Multiplanar reconstructed images including MIPs were obtained and reviewed to evaluate the vascular anatomy.  Contrast: 80mL OMNIPAQUE IOHEXOL 350 MG/ML SOLN  Comparison: None.  Findings: Satisfactory opacification of the pulmonary arteries noted, and there is no evidence of pulmonary emboli.  No evidence of thoracic aortic aneurysm.  Poor contrast opacification of thoracic aorta seen, but no definite dissection visualized.  No evidence of  mediastinal hematoma or mass.  Moderate right and small left pleural effusions are seen.  Diffuse interstitial infiltrates are seen, consistent with interstitial edema. There is also streaky opacity with air bronchograms in the right middle lobe, and superimposed pneumonia cannot be excluded.  No central endobronchial obstruction identified.  No evidence of hilar or mediastinal lymphadenopathy. Borderline cardiomegaly noted.  IMPRESSION:  1.  No evidence of pulmonary embolism. 2.  Diffuse interstitial infiltrates or edema and bilateral pleural effusions, highly suspicious for congestive heart failure. 3.  Right middle lobe infiltrate versus atelectasis. Superimposed pneumonia cannot be excluded. 4.  No mass or lymphadenopathy identified.   Original Report Authenticated By: Myles Rosenthal, M.D.    Portable Chest 1 View  07/31/2012  *RADIOLOGY REPORT*  Clinical Data: Pulmonary edema.  PORTABLE CHEST - 1 VIEW  Comparison: Chest CT 07/30/2012  Findings: Single view of the chest again demonstrates bibasilar densities.  Findings are suggestive for a combination of pleural fluid and volume loss.  Concern for airspace disease in the right lower lung.  Heart size is within normal limits.  Negative for a pneumothorax.  IMPRESSION: Bibasilar densities.  Findings suggest a combination of pleural fluid and volume loss.  There is also concern for airspace disease in the right lower lung.   Original Report Authenticated By: Richarda Overlie, M.D.  ECHO: Transthoracic Echocardiography  Patient: [name] [name], [name] [name] MR #: [id number] Study Date: 07/31/2012 Gender: F Age: 78 Height: 165.1cm Weight: 64.9kg BSA: 1.34m^2 Pt. Status: Room: 1229  PERFORMING [place], [facility] ADMITTING [place], Sorin ATTENDING [place], Sorin ORDERING [place], Sorin REFERRING [place], Sorin SONOGRAPHER [place], [place] cc:  ------------------------------------------------------------ LV EF: 20% -  25%  ------------------------------------------------------------ History: PMH: LBBB. Pulmonary edema. Risk factors: Hypothyroidism. Vitamin D deficiency. Acute respiratory failure. Osteoarthritis. Diabetes mellitus. Dyslipidemia.  ------------------------------------------------------------ Study Conclusions  - Left ventricle: The cavity size was normal. Wall thickness was increased in a pattern of mild LVH. Systolic function was severely reduced. The estimated ejection fraction was in the range of 20% to 25%. Diffuse hypokinesis. Doppler parameters are consistent with abnormal left ventricular relaxation (grade 1 diastolic dysfunction). - Ventricular septum: Septal motion showed paradox. - Mitral valve: Moderate regurgitation. - Left atrium: The atrium was mildly dilated. Transthoracic echocardiography. M-mode, complete 2D, spectral Doppler, and color Doppler. Height: Height: 165.1cm. Height: 65in. Weight: Weight: 64.9kg. Weight: 142.7lb. Body mass index: BMI: 23.8kg/m^2. Body surface area: BSA: 1.63m^2. Blood pressure: 123/66. Patient status: Inpatient. Location: ICU/CCU  ------------------------------------------------------------  ------------------------------------------------------------ Left ventricle: The cavity size was normal. Wall thickness was increased in a pattern of mild LVH. Systolic function was severely reduced. The estimated ejection fraction was in the range of 20% to 25%. Diffuse hypokinesis. Doppler parameters are consistent with abnormal left ventricular relaxation (grade 1 diastolic dysfunction).  ------------------------------------------------------------ Aortic valve: Trileaflet; mildly thickened, mildly calcified leaflets.  ------------------------------------------------------------ Aorta: Aortic root: The aortic root was normal in size. Ascending aorta: The ascending aorta was normal in  size.  ------------------------------------------------------------ Mitral valve: Mildly thickened leaflets . Doppler: Moderate regurgitation. Peak gradient: 4mm Hg (D).  ------------------------------------------------------------ Left atrium: The atrium was mildly dilated.  ------------------------------------------------------------ Right ventricle: The cavity size was normal. Wall thickness was normal. Systolic function was normal.  ------------------------------------------------------------ Ventricular septum: Septal motion showed paradox.  ------------------------------------------------------------ Pulmonic valve: The valve appears to be grossly normal.  ------------------------------------------------------------ Tricuspid valve: Mildly thickened leaflets. Doppler: Trivial regurgitation.  ------------------------------------------------------------ Right atrium: The atrium was at the upper limits of normal in size.  ------------------------------------------------------------ Pericardium: The pericardium was normal in appearance.  ------------------------------------------------------------  2D measurements Normal Doppler Normal Left ventricle measurements LVID ED, 41 mm 43-52 Left ventricle chord, Ea, lat 7.31 cm/ ------- PLAX ann, tiss s LVID ES, 40 mm 23-38 DP chord, E/Ea, lat 12.9 ------- PLAX ann, tiss FS, chord, 2 % >29 DP PLAX Ea, med 5.36 cm/ ------- LVPW, ED 11 mm ------ ann, tiss s IVS/LVPW 1.09 <1.3 DP ratio, ED E/Ea, med 17.59 ------- Ventricular septum ann, tiss IVS, ED 12 mm ------ DP Aorta Mitral valve Root diam, 33 mm ------ Peak E vel 94.3 cm/ ------- ED s Left atrium Peak A vel 101 cm/ ------- AP dim 35 mm ------ s AP dim 2.03 cm/m^2 <2.2 Deceleratio 165 ms 150-230 index n time Vol, S 99 ml ------ Peak 4 mm ------- Vol index, 57.6 ml/m^2 ------ gradient, D Hg S Peak E/A 0.9 ------- ratio Systemic veins Estimated 20 mm ------- CVP  Hg Right ventricle Sa vel, lat 8.09 cm/ ------- ann, tiss s DP  ------------------------------------------------------------ Prepared and Electronically Authenticated by  Cassell Clement    PHYSICAL EXAM General: Well developed, elderly, in no acute distress. Head: Normocephalic, atraumatic, sclera non-icteric, no xanthomas, nares are without discharge. Neck: Negative for carotid bruits. JVD elevated  5 cm Lungs: Clear bilaterally to auscultation without wheezes, rales, or rhonchi. Decreased BS left base. Heart: RRR S1 S2  without murmurs, rubs, or gallops.  Abdomen: Soft, non-tender, non-distended with normoactive bowel sounds. No hepatomegaly. No rebound/guarding. No obvious abdominal masses. Msk:  Strength and tone appears normal for age. Extremities: No clubbing, cyanosis or edema.  Distal pedal pulses are 2+ and equal bilaterally. Neuro: Alert and oriented X 3. Moves all extremities spontaneously. Psych:  Responds to questions appropriately with a normal affect.  ASSESSMENT AND PLAN: 1. Acute systolic CHF. EF 20-25%. Now with low output. Persistent hypotension despite 1 liter of IV saline this am. Received lisinopril, aldactone, lasix yesterday. Coreg not given. Will need to transfer to unit to initiate IV pressors to improve renal perfusion. Consider milrinone. Repeat CXR. Check BNP.  2. Acute renal failure. Creatinine increased from 1.13>>3.11. ? Related to ACEi and diuretics versus poor renal perfusion. Check renal US. Transfer to unit for IV pressors.  3. Low grade fever. Repeat CXR and check UA.   4. RLL pneumonia.  5. Diabetes mellitus.  Principal Problem:  *Acute respiratory failure Active Problems:  HYPOTHYROIDISM  Type II or unspecified type diabetes mellitus with neurological manifestations, uncontrolled(250.62)  DIABETIC PERIPHERAL NEUROPATHY  VITAMIN D DEFICIENCY  HYPERLIPIDEMIA, MIXED  Elevated d-dimer  Pulmonary edema  LBBB (left bundle branch  block)  Systolic CHF, acute  Cardiomyopathy  HTN (hypertension)    Signed, Gohan Collister [place] MD,FACC 08/03/2012 7:38 AM

## 2012-08-03 NOTE — Progress Notes (Addendum)
[facility] PROGRESS NOTE  [name] [name] WJX:[id number] DOB: 05-22-1934 DOA: 07/30/2012 PCP: Sanda Linger, MD  Brief narrative 78 y.o. female with history of diabetes, hypothyroidism and hypertension who was sent to the emergency room on 12/19 by her primary care physician with worsening dyspnea. The patient has no prior cardiac history. She had elevated d-dimer but CTA chest was negative for PE. In the ED, patient was found to be tachypneic with sats dropping into the 70s on 10 L nasal cannula. She received Lasix 40 mg IV, morphine 2 mg IV and, initiated BiPAP for respiratory support. Patient was also placed on a nitroglycerin drip by the emergency room physician.   Assessment/Plan:  Hypotension/shock  Likely secondary to polypharmacy (Lasix, Aldactone, Lisinopril & Carvedilol) and diuresis.  S/P IVF bolus overnight with minimal improvement. All culprit medications currently held.  Transferring to step down unit for close monitoring and vasopressors (starting IV dopamine infusion per cardiology recommendations)  Brief IV fluids.  PCCM consulted for central line placement and evaluation & Mx.  Acute renal failure, nonoliguric  Likely secondary to ATN from hypotension versus lisinopril and diuresis.  Management as per above.  Follow BMP daily.  Check renal ultrasound for completion.  Acute respiratory failure  secondary to pulmonary edema- resolved after diuretics, bipap and nitro drip from 12/19 until 12/20 . And possible pneumonia.  CHF, acute systolic  echocardiogram result indicating severe depressed EF  patient started on diuretics on admission and symptoms have resolved.   Cardiology consulted and did not recommend cath . Etiology is unclear .   Plan for treatment with oral furosemide, spironolactone, lisinopril-held since 12/22 PM due to hypotension.   Currently compensated.  Cardiomyopathy  EF 20-25% with diffuse hypokinesis.  Cardiology  following.  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 Possible DC an outpatient followup with them in 3-4 weeks for Adenosine Myoview. (See Dr. Fabio Bering 12/21 note)  Per cardiology recommendations, may start milrinone once blood pressures are better  HTN  Hypotensive. All medications held.  Hypothyroidism  synthroid daily  DM   ssi for now. May resume oral meds at DC  RLL pneumonia   started on empiric levaquin on admission.  Complete 7 days of antibiotics (day 5/7).  Followup chest x-ray as outpatient in a couple of weeks.    Elevated D dimer   ?Cause , afebrile, will need outpt f/u .   Rheum factor highly elevated, ana pending - may need rheum outpt f/u   Anemia  Follow CBCs.  Code Status: Full Family Communication: Discussed with patient and sons Mr. Talibah Colasurdo & Mr. Harlene Petralia at bedside. Disposition Plan: home   Consultants: [facility]   Procedures:  Bipap  Antibiotics:  Rocephin IV x1 dose on 12/19  Levaquin IV 12/19-12/20  By mouth Levaquin 12/21 >  HPI/Subjective: Feels "oozy". Denies dyspnea or chest pain.  Objective: Filed Vitals:   08/03/12 0915 08/03/12 0930 08/03/12 0945 08/03/12 1000  BP: 80/40 79/40 74/46  125/51  Pulse: 73 70 68 73  Temp:      TempSrc:      Resp: 16 18 16 15   Height:      Weight:      SpO2: 92% 98% 96% 98%      Intake/Output Summary (Last 24 hours) at 08/03/12 1029 Last data filed at 08/03/12 0939  Gross per 24 hour  Intake 1485.9 ml  Output    625 ml  Net  860.9 ml   Filed Weights   08/01/12 0500 08/02/12 0616 08/03/12 0613  Weight: 63.6 kg (140 lb 3.4 oz) 61 kg (134 lb 7.7 oz) 63.1 kg (139 lb 1.8 oz)    Exam:   General exam: Comfortable. Sitting up at the edge of bed.  Respiratory system: clear to auscultation bilaterally. No increased work of  breathing.  Cardiovascular system: First and second heart sounds heard, regular rate and rhythm. No JVD, murmurs or pedal edema. Telemetry shows sinus rhythm with wide QRS.  Gastrointestinal system: Abdomen is nondistended, soft and nontender. Normal bowel sounds heard.  Central nervous system: Alert and oriented. No focal neurological deficits.  Extremities: Symmetric 5 x 5 power.   Data Reviewed: Basic Metabolic Panel:  Lab 08/03/12 1610 08/02/12 0434 07/31/12 0530 07/30/12 1144  NA 132* 136 136 140  K 3.8 3.7 3.6 4.0  CL 94* 96 99 105  CO2 22 28 24 25   GLUCOSE 125* 153* 160* 266*  BUN 32* 20 13 12   CREATININE 3.11* 1.13* 0.90 0.9  CALCIUM 8.9 9.3 9.2 9.7  MG -- -- -- --  PHOS -- -- -- --   Liver Function Tests:  Lab 07/31/12 0530 07/30/12 1144  AST 16 19  ALT 12 17  ALKPHOS 45 46  BILITOT 0.5 0.8  PROT 7.4 7.9  ALBUMIN 3.5 4.0   No results found for this basename: LIPASE:5,AMYLASE:5 in the last 168 hours No results found for this basename: AMMONIA:5 in the last 168 hours CBC:  Lab 08/03/12 0443 07/31/12 0530 07/30/12 1144  WBC 6.5 7.0 5.7  NEUTROABS -- -- 3.1  HGB 11.9* 11.6* 12.2  HCT 36.1 34.3* 37.8  MCV 80.2 79.2 83.0  PLT 250 261 258.0   Cardiac Enzymes:  Lab 07/31/12 0530 07/30/12 1904 07/30/12 1144  CKTOTAL -- -- 159  CKMB -- -- 3.0  CKMBINDEX -- -- --  TROPONINI <0.30 <0.30 0.02   BNP (last 3 results)  Basename 08/03/12 0443 07/30/12 1144  PROBNP 2238.0* 674.0*   CBG:  Lab 08/03/12 0528 08/02/12 2329 08/02/12 1652 08/02/12 1134 08/02/12 0525  GLUCAP 116* 161* 98 293* 162*    Recent Results (from the past 240 hour(s))  MRSA PCR SCREENING     Status: Normal   Collection Time   07/30/12  6:49 PM      Component Value Range Status Comment   MRSA by PCR NEGATIVE  NEGATIVE Final     Other lab data  Hemoglobin A1c: 8.3  TSH: 3.64  ANA negative  Rheumatoid factor: Greater than 600.  2-D echo Study Conclusions  - Left ventricle:  The cavity size was normal. Wall thickness was increased in a pattern of mild LVH. Systolic function was severely reduced. The estimated ejection fraction was in the range of 20% to 25%. Diffuse hypokinesis. Doppler parameters are consistent with abnormal left ventricular relaxation (grade 1 diastolic dysfunction). - Ventricular septum: Septal motion showed paradox. - Mitral valve: Moderate regurgitation. - Left atrium: The atrium was mildly dilated.   Studies: Dg Chest Port 1v Same Day  08/03/2012  *RADIOLOGY REPORT*  Clinical Data: CHF  PORTABLE CHEST - 1 VIEW SAME DAY  Comparison: 07/31/2012  Findings: Heart is upper normal in size allowing for portable technique and low volumes.  Bibasilar airspace disease markedly improved with some residual probable subsegmental atelectasis.  No pneumothorax.  Tiny pleural effusions suspected.  IMPRESSION: Improved bilateral lower lobe consolidation.   Original Report  Authenticated By: Jolaine Click, M.D.     Scheduled Meds:    . antiseptic oral rinse  15 mL Mouth Rinse BID  . aspirin EC  81 mg Oral Daily  . atorvastatin  10 mg Oral q1800  . enoxaparin (LOVENOX) injection  30 mg Subcutaneous Q24H  . insulin aspart  0-9 Units Subcutaneous Q6H  . levofloxacin  500 mg Oral Q48H  . levothyroxine  100 mcg Oral QAC breakfast   Continuous Infusions:    . sodium chloride    . DOPamine 10 mcg/kg/min (08/03/12 0950)    Principal Problem:  *Acute respiratory failure Active Problems:  HYPOTHYROIDISM  Type II or unspecified type diabetes mellitus with neurological manifestations, uncontrolled(250.62)  DIABETIC PERIPHERAL NEUROPATHY  VITAMIN D DEFICIENCY  HYPERLIPIDEMIA, MIXED  Elevated d-dimer  Pulmonary edema  LBBB (left bundle branch block)  Systolic CHF, acute  Cardiomyopathy  HTN (hypertension)  Acute renal failure  Hypotension    [facility]  [facility] Pager [phone]. If 8PM-8AM, please contact night-coverage at  [email or web address], password [facility] 08/03/2012, 10:29 AM  LOS: 4 days

## 2012-08-03 NOTE — Procedures (Signed)
Central Venous Catheter Insertion Procedure Note [name] [name] [id number] 1934-08-07  Procedure: Insertion of Central Venous Catheter Indications: Assessment of intravascular volume and Drug and/or fluid administration  Procedure Details Consent: Risks of procedure as well as the alternatives and risks of each were explained to the (patient/caregiver).  Consent for procedure obtained. Time Out: Verified patient identification, verified procedure, site/side was marked, verified correct patient position, special equipment/implants available, medications/allergies/relevent history reviewed, required imaging and test results available.  Performed  Maximum sterile technique was used including antiseptics, cap, gloves, gown, hand hygiene, mask and sheet. Skin prep: Chlorhexidine; local anesthetic administered A antimicrobial bonded/coated triple lumen catheter was placed in the right internal jugular vein using the Seldinger technique and real-time US guidance.  Evaluation Blood flow good Complications: No apparent complications Patient did tolerate procedure well. Chest X-ray ordered to verify placement.  CXR: No evidence PTX. Her infiltrates are more prominent than earlier film this am. .  [name] [name]. 08/03/2012, 12:22 PM

## 2012-08-03 NOTE — Consult Note (Signed)
PULMONARY  / CRITICAL CARE MEDICINE  Name: [name] [name] MRN: [id number] DOB: 15-Sep-1933    LOS: 4  REFERRING MD :   [email or web address]  CHIEF COMPLAINT:  Heart failure and hypotension.   BRIEF PATIENT DESCRIPTION:  This is a 78 year old female with newly diagnosed systolic heart failure w/ resultant pulmonary edema on 12/19. She was treated supportively w/ NIPPV and diuresis. Additional medical therapy w/ BB and ACE-I also added. She had improved significantly, but on 12/23 developed hypotension that did not respond to 1 liter NS replacement. PCCM asked to assist w/ her care.   LINES / TUBES: Right IJ CVL 12/23>>>  CULTURES: mrsa swab 12/19: negative   ANTIBIOTICS: levaquin 12/19>>>12/23  SIGNIFICANT EVENTS:  Echo 12/20: EF 20-25%, diastolic dysfxn gd I, diffuse hypokinesis   LEVEL OF CARE:  ICU  PRIMARY SERVICE:  [facility]  CONSULTANTS:  Cardiology and PCCM  CODE STATUS: full  DIET:  NAS  DVT Px:  LMWH  GI Px:    HISTORY OF PRESENT ILLNESS:   78 y.o. female with history of diabetes, hypothyroidism and hypertension who was sent to the emergency room on 12/19 by her primary care physician with worsening dyspnea. The patient has no prior cardiac history. She had elevated d-dimer but CTA chest was negative for PE. In the ED, patient was found to be tachypneic with sats dropping into the 70s on 10 L nasal cannula. She received Lasix 40 mg IV, morphine 2 mg IV and, initiated BiPAP for respiratory support. Patient was also placed on a nitroglycerin drip by the emergency room physician. Initially improved with these therapies. Cardiology consulted. Additional support added: lisinopril and aldactone. She developed persistent hypotension on on 12/23 which did not improve w/ 1 liter of IVFs. She was therefore transferred to the ICU w/ plans to begin inotropic support. PCCM was called to assist w/ IV access and assist w/ her care. Prior to PCCM assessment she was started on dopamine gtt. Within a half  hour she developed progressive nausea and vomiting first noted on our initial assessment. We treated this by: giving 1 x dose of reglan, administering fluid challenge and cutting dopamine infusion in half. On time of note pt's SBP is now consistently >100. We will continue to assess to see what other alterations can be made.    PAST MEDICAL HISTORY :  Past Medical History  Diagnosis Date  . Hypothyroidism   . High cholesterol   . Osteoarthritis   . Diabetes mellitus    Past Surgical History  Procedure Date  . Abdominal hysterectomy 1980  . Bmd 2006   Prior to Admission medications   Medication Sig Start Date End Date Taking? Authorizing Provider  ergocalciferol (VITAMIN D2) 50000 UNITS capsule Take 50,000 Units by mouth once a week. 04/01/12  Yes [name], MD  glimepiride (AMARYL) 4 MG tablet Take 4 mg by mouth daily before breakfast. 04/06/12 04/06/13 Yes [name], MD  glucose blood test strip 1 each by Other route as needed. Test twice daily.  Please dispense a 3 month supply 04/01/12  Yes [name], MD  Lancets (ACCU-CHEK MULTICLIX) lancets 1 each by Other route daily. Use as instructed    Yes Historical Provider, MD  levothyroxine (SYNTHROID, LEVOTHROID) 100 MCG tablet Take 100 mcg by mouth daily. BRAND MEDICALLY NECESSARY. 04/01/12  Yes [name], MD  Pitavastatin Calcium 4 MG TABS Take 1 tablet by mouth daily. For cholesterol. 04/01/12  Yes [name],  MD  sitaGLIPtan-metformin (JANUMET) 50-1000 MG per tablet Take 1 tablet by mouth 2 (two) times daily with a meal. 04/01/12 04/01/13 Yes [name], MD   Allergies  Allergen Reactions  . Statins     FAMILY HISTORY:  Family History  Problem Relation Age of Onset  . Diabetes Other   . Hypertension Other   . Uterine cancer Other    SOCIAL HISTORY:  reports that she has never smoked. She does not have any smokeless tobacco history on file. She reports that she does not drink alcohol or use illicit  drugs.  REVIEW OF SYSTEMS:  Review of Systems  Constitutional: No weight loss, gain, night sweats, Fevers, chills, fatigue .  HEENT: No headaches, visual changes, Difficulty swallowing, Tooth/dental problems, or Sore throat,  No sneezing, itching, ear ache, nasal congestion, post nasal drip, no visual complaints CV: No chest pain, Orthopnea, PND, swelling in lower extremities, dizziness, palpitations, syncope.  GI No heartburn, indigestion, + abdominal pain, + nausea, vomiting, -diarrhea,- change in bowel habits, loss of appetite, bloody stools.  Resp: No cough, No coughing up of blood. No change in color of mucus. No wheezing.  Skin: no rash or itching or icterus GU: no dysuria, change in color of urine, no urgency or frequency. No flank pain, no hematuria  MS: No joint pain or swelling. No decreased range of motion  Psych: No change in mood or affect. No depression or anxiety.  Neuro: no difficulty with speech, weakness, numbness, ataxia    INTERVAL HISTORY:  Feels better after fluid challenge  VITAL SIGNS: Temp:  [98.5 F (36.9 C)-100.3 F (37.9 C)] 98.6 F (37 C) (12/23 0606) Pulse Rate:  [70-80] 70  (12/23 0930) Resp:  [16-18] 18  (12/23 0930) BP: (78-94)/(40-58) 79/40 mmHg (12/23 0930) SpO2:  [90 %-98 %] 98 % (12/23 0930) Weight:  [63.1 kg (139 lb 1.8 oz)] 63.1 kg (139 lb 1.8 oz) (12/23 0613) HEMODYNAMICS:   VENTILATOR SETTINGS:   INTAKE / OUTPUT: Intake/Output      12/22 0701 - 12/23 0700 12/23 0701 - 12/24 0700   P.O. 720    I.V. (mL/kg)  5.9 (0.1)   IV Piggyback 1000    Total Intake(mL/kg) 1720 (27.3) 5.9 (0.1)   Urine (mL/kg/hr) 625 (0.4)    Total Output 625    Net +1095 +5.9        Stool Occurrence  1 x     PHYSICAL EXAMINATION: General:  78 year old female, currently in no distress after treating nausea.  Neuro:  Awake, oriented, no focal def  HEENT:  [place], no JVD, no adenopathy  Cardiovascular:  rrr Lungs:  Crackles in bases  Abdomen:  Non-tender +  bowel sounds  Musculoskeletal:  Intact  Skin:  Intact    LABS: Cbc  Lab 08/03/12 0443 07/31/12 0530 07/30/12 1144  WBC 6.5 -- --  HGB 11.9* 11.6* 12.2  HCT 36.1 34.3* 37.8  PLT 250 261 258.0    Chemistry   Lab 08/03/12 0443 08/02/12 0434 07/31/12 0530  NA 132* 136 136  K 3.8 3.7 3.6  CL 94* 96 99  CO2 22 28 24   BUN 32* 20 13  CREATININE 3.11* 1.13* 0.90  CALCIUM 8.9 9.3 9.2  MG -- -- --  PHOS -- -- --  GLUCOSE 125* 153* 160*    Liver fxn  Lab 07/31/12 0530 07/30/12 1144  AST 16 19  ALT 12 17  ALKPHOS 45 46  BILITOT 0.5 0.8  PROT 7.4  7.9  ALBUMIN 3.5 4.0   coags No results found for this basename: APTT:3,INR:3 in the last 168 hours Sepsis markers No results found for this basename: LATICACIDVEN:3,PROCALCITON:3 in the last 168 hours Cardiac markers  Lab 07/31/12 0530 07/30/12 1904 07/30/12 1144  CKTOTAL -- -- 159  CKMB -- -- 3.0  TROPONINI <0.30 <0.30 0.02   BNP  Lab 08/03/12 0443 07/30/12 1144  PROBNP 2238.0* 674.0*   ABG No results found for this basename: PHART:3,PCO2ART:3,PO2ART:3,HCO3:3,TCO2:3 in the last 168 hours  CBG trend  Lab 08/03/12 0528 08/02/12 2329 08/02/12 1652 08/02/12 1134 08/02/12 0525  GLUCAP 116* 161* 98 293* 162*    IMAGING: RLL atx, improved aeration, earlier, on f/u film 12/23 there is some worsening of right sided airspace disease.  ECG:  DIAGNOSES: Principal Problem:  *Acute respiratory failure Active Problems:  HYPOTHYROIDISM  Type II or unspecified type diabetes mellitus with neurological manifestations, uncontrolled(250.62)  DIABETIC PERIPHERAL NEUROPATHY  VITAMIN D DEFICIENCY  HYPERLIPIDEMIA, MIXED  Elevated d-dimer  Pulmonary edema  LBBB (left bundle branch block)  Systolic CHF, acute  Cardiomyopathy  HTN (hypertension)  Acute renal failure  Hypotension   ASSESSMENT / PLAN:  PULMONARY  ASSESSMENT: Acute respiratory failure (resolved) RLL ATX, no clinical findings to support this being  infection.  PLAN:   Shoot for even volume status after hypotension issues resolved CVP monitoring Wean FIO2 F/u serial CXR IS   CARDIOVASCULAR  ASSESSMENT:  Acute non-ischemic systolic heart failure (EF 20-25%) Shock--> favor hypovolemic shock c/b simultaneous administration of antihypertensives. Do not think this is cardiogenic shock but underlying CM may require inotrope support. She got significantly nauseated after starting dopamine gtt raising concern for increased cardiac oxygen requirements and/or transient gut ischemia. She seemed to respond to volume.  PLAN:  Hold antihypertensives Hold diuretics Check CVP-->will aim for systolic goal > 100 and CVP >8 Titrate dopamine gtt to assist w/ BP goal Additional recs per cards-->will need to see if she needs inotrope support after volume status corrected   RENAL  ASSESSMENT:   Acute renal failure in setting of volume depletion and hypoperfusion, aggressive diuresis PLAN:   Hold antihypertensives and diuretics Keep volume status even at this point as CVP >10 and SBP >100 Avoid nephrotoxins   GASTROINTESTINAL  ASSESSMENT:   Nausea and vomiting: almost certainly due to dopamine. Resolved w/ lower dose and volume challenge.  PLAN:   Supportive care   HEMATOLOGIC  ASSESSMENT:   Anemia  PLAN:  F/u hgb Transfuse for hgb goal >8  INFECTIOUS  ASSESSMENT:   No evidence of PNA PLAN:   D/c levaquin 12/23  ENDOCRINE  ASSESSMENT:   PLAN:   DM Hypothyroidism ssi Replace synthroid  NEUROLOGIC  ASSESSMENT:   No acute issue  PLAN:   Supportive care    Clinical summary This is a 78 year female w/ newly diagnosed NICM (EF 20-25%). Moved to ICU w/ hypotension in setting of over-diuresis, complicated by antihypertensives, and poor LV function. Will give back fluids, hold antihypertensives and titrate inotropic support.   Kreg Shropshire, ACNP Pulmonary and Critical Care Medicine [facility] Pager: [phone]  08/03/2012, 9:59 AM  CC time 60 minutes  Levy Pupa, MD, PhD 08/03/2012, 12:18 PM [facility] [phone] or if no answer [phone]

## 2012-08-03 NOTE — Progress Notes (Signed)
Pt's BP 84/42 after 250cc bolus. Pt lungs sound clear to ascultation. MD notified, orders for additional 250cc bolus. Will cont to monitor.  Earnest Conroy. Clelia Croft, RN

## 2012-08-04 ENCOUNTER — Inpatient Hospital Stay (HOSPITAL_COMMUNITY): Payer: Medicare Other

## 2012-08-04 DIAGNOSIS — R197 Diarrhea, unspecified: Secondary | ICD-10-CM | POA: Diagnosis not present

## 2012-08-04 DIAGNOSIS — I447 Left bundle-branch block, unspecified: Secondary | ICD-10-CM

## 2012-08-04 DIAGNOSIS — I959 Hypotension, unspecified: Secondary | ICD-10-CM | POA: Diagnosis not present

## 2012-08-04 DIAGNOSIS — J9600 Acute respiratory failure, unspecified whether with hypoxia or hypercapnia: Secondary | ICD-10-CM | POA: Diagnosis not present

## 2012-08-04 DIAGNOSIS — J9819 Other pulmonary collapse: Secondary | ICD-10-CM | POA: Diagnosis not present

## 2012-08-04 DIAGNOSIS — N179 Acute kidney failure, unspecified: Secondary | ICD-10-CM | POA: Diagnosis not present

## 2012-08-04 DIAGNOSIS — I509 Heart failure, unspecified: Secondary | ICD-10-CM | POA: Diagnosis not present

## 2012-08-04 DIAGNOSIS — I428 Other cardiomyopathies: Secondary | ICD-10-CM | POA: Diagnosis not present

## 2012-08-04 LAB — CBC
MCH: 26.8 pg (ref 26.0–34.0)
Platelets: 264 10*3/uL (ref 150–400)
RBC: 4.25 MIL/uL (ref 3.87–5.11)
WBC: 5.8 10*3/uL (ref 4.0–10.5)

## 2012-08-04 LAB — GLUCOSE, CAPILLARY
Glucose-Capillary: 170 mg/dL — ABNORMAL HIGH (ref 70–99)
Glucose-Capillary: 169 mg/dL — ABNORMAL HIGH (ref 70–99)
Glucose-Capillary: 139 mg/dL — ABNORMAL HIGH (ref 70–99)

## 2012-08-04 LAB — CREATININE, URINE, RANDOM: Creatinine, Urine: 76 mg/dL

## 2012-08-04 LAB — BASIC METABOLIC PANEL
GFR calc Af Amer: 72 mL/min — ABNORMAL LOW (ref >90)
GFR calc non Af Amer: 62 mL/min — ABNORMAL LOW (ref >90)
Glucose, Bld: 127 mg/dL — ABNORMAL HIGH (ref 70–99)
Potassium: 3.9 mEq/L (ref 3.5–5.1)
Sodium: 137 mEq/L (ref 135–145)

## 2012-08-04 LAB — COMPREHENSIVE METABOLIC PANEL
ALT: 19 U/L (ref 0–35)
AST: 26 U/L (ref 0–37)
CO2: 24 mEq/L (ref 19–32)
Calcium: 8.8 mg/dL (ref 8.4–10.5)
GFR calc non Af Amer: 52 mL/min — ABNORMAL LOW (ref >90)
Sodium: 136 mEq/L (ref 135–145)

## 2012-08-04 LAB — NA AND K (SODIUM & POTASSIUM), RAND UR: Potassium Urine: 14 mEq/L

## 2012-08-04 MED ORDER — ENOXAPARIN SODIUM 40 MG/0.4ML ~~LOC~~ SOLN
40.0000 mg | SUBCUTANEOUS | Status: DC
  Administered 2012-08-04 – 2012-08-06 (×3): 40 mg via SUBCUTANEOUS
  Filled 2012-08-04 (×4): qty 0.4

## 2012-08-04 MED ORDER — INSULIN ASPART 100 UNIT/ML ~~LOC~~ SOLN
0.0000 [IU] | Freq: Three times a day (TID) | SUBCUTANEOUS | Status: DC
  Administered 2012-08-04: 1 [IU] via SUBCUTANEOUS
  Administered 2012-08-04: 2 [IU] via SUBCUTANEOUS
  Administered 2012-08-04 – 2012-08-05 (×2): 1 [IU] via SUBCUTANEOUS
  Administered 2012-08-05: 5 [IU] via SUBCUTANEOUS
  Administered 2012-08-05: 3 [IU] via SUBCUTANEOUS

## 2012-08-04 NOTE — Progress Notes (Signed)
Patient evaluated for community based chronic disease management services with [facility] Care Management Program as a benefit of patient's [employer]. Patient will receive a post discharge transition of care call and will be evaluated for monthly home visits for assessments and disease process education.  Spoke with patient and sone at bedside to explain [facility] Care Management services. Patient has accepted services and consents obtained.  Patient has requested to receive CHF education and support at home. Left contact information and [facility] literature at bedside. Made inpatient Case Manager aware that [facility] Care Management following. Of note, [facility] Care Management services does not replace or interfere with any services that are arranged by inpatient case management or social work.  For additional questions or referrals please contact Anibal Henderson BSN RN [facility] Liaison at [phone].

## 2012-08-04 NOTE — Consult Note (Signed)
PULMONARY  / CRITICAL CARE MEDICINE  Name: [name] [name] MRN: [id number] DOB: [birth year]    LOS: 5  REFERRING MD :  Dr. Waymon Amato  CHIEF COMPLAINT:  Heart failure and hypotension.   BRIEF PATIENT DESCRIPTION:  78 year old female with newly diagnosed systolic heart failure w/ resultant pulmonary edema on 12/19. She was treated supportively w/ NIPPV and diuresis. Additional medical therapy w/ BB and ACE-I also added. She had improved significantly, but on 12/23 developed hypotension that did not respond to 1 liter NS replacement. PCCM asked to assist w/ her care.   LINES / TUBES: Right IJ CVL 12/23>>>  CULTURES: mrsa swab 12/19>>>negative   ANTIBIOTICS: levaquin 12/19>>>12/23  SIGNIFICANT EVENTS:  Echo 12/20: EF 20-25%, diastolic dysfxn gd I, diffuse hypokinesis   LEVEL OF CARE:  ICU  PRIMARY SERVICE:  [facility]  CONSULTANTS:  Cardiology and PCCM  CODE STATUS: full  DIET:  NAS  DVT Px:  LMWH  GI Px:     INTERVAL HISTORY:  Reports two episodes of mod amt diarrhea, nausea, decreased appetite but no vomiting.  VITAL SIGNS: Temp:  [98.4 F (36.9 C)-99.7 F (37.6 C)] 98.4 F (36.9 C) (12/24 0800) Pulse Rate:  [67-108] 72  (12/24 0900) Resp:  [12-24] 14  (12/24 0900) BP: (88-138)/(43-59) 95/50 mmHg (12/24 0900) SpO2:  [92 %-100 %] 95 % (12/24 0900) Weight:  [142 lb 3.2 oz (64.5 kg)] 142 lb 3.2 oz (64.5 kg) (12/24 0400)  HEMODYNAMICS: CVP:  [3 mmHg-14 mmHg] 10 mmHg  VENTILATOR SETTINGS:   INTAKE / OUTPUT: Intake/Output      12/23 0701 - 12/24 0700 12/24 0701 - 12/25 0700   P.O. 240    I.V. (mL/kg) 449.9 (7) 7 (0.1)   Other 800 100   IV Piggyback 502    Total Intake(mL/kg) 1991.9 (30.9) 107 (1.7)   Urine (mL/kg/hr) 1950 (1.3)    Total Output 1950    Net +41.9 +107        Urine Occurrence 1 x    Stool Occurrence 2 x    Emesis Occurrence 1 x      PHYSICAL EXAMINATION: General:  wdwn adult female in NAD Neuro:  Awake, oriented, no focal def  HEENT:  , no  JVD, no adenopathy  Cardiovascular:  rrr Lungs:  resp's even/non-labored, lungs bilaterally with few fine crackles bases Abdomen:  Non-tender, decreased bowel sounds  Musculoskeletal:  Intact  Skin:  Intact    LABS: Cbc  Lab 08/04/12 0300 08/03/12 0443 07/31/12 0530  WBC 5.8 -- --  HGB 11.4* 11.9* 11.6*  HCT 34.2* 36.1 34.3*  PLT 264 250 261   Chemistry  Lab 08/04/12 0300 08/03/12 0443 08/02/12 0434  NA 136 132* 136  K 3.9 3.8 3.7  CL 103 94* 96  CO2 24 22 28   BUN 17 32* 20  CREATININE 1.02 3.11* 1.13*  CALCIUM 8.8 8.9 9.3  MG -- -- --  PHOS -- -- --  GLUCOSE 168* 125* 153*   Liver fxn  Lab 08/04/12 0300 07/31/12 0530 07/30/12 1144  AST 26 16 19   ALT 19 12 17   ALKPHOS 44 45 46  BILITOT 0.4 0.5 0.8  PROT 6.9 7.4 7.9  ALBUMIN 3.2* 3.5 4.0   coags No results found for this basename: APTT:3,INR:3 in the last 168 hours  Sepsis markers No results found for this basename: LATICACIDVEN:3,PROCALCITON:3 in the last 168 hours  Cardiac markers  Lab 07/31/12 0530 07/30/12 1904 07/30/12 1144  CKTOTAL -- -- 159  CKMB -- -- 3.0  TROPONINI <0.30 <0.30 0.02   BNP  Lab 08/04/12 0300 08/03/12 0443 07/30/12 1144  PROBNP 1926.0* 2238.0* 674.0*   ABG No results found for this basename: PHART:3,PCO2ART:3,PO2ART:3,HCO3:3,TCO2:3 in the last 168 hours  CBG trend  Lab 08/04/12 0616 08/03/12 2336 08/03/12 1652 08/03/12 1221 08/03/12 0528  GLUCAP 170* 145* 213* 196* 116*    IMAGING: RLL atx, improved aeration, earlier, on f/u film 12/23 there is some worsening of right sided airspace disease.   ECG:  DIAGNOSES: Principal Problem:  *Acute respiratory failure Active Problems:  HYPOTHYROIDISM  Type II or unspecified type diabetes mellitus with neurological manifestations, uncontrolled(250.62)  DIABETIC PERIPHERAL NEUROPATHY  VITAMIN D DEFICIENCY  HYPERLIPIDEMIA, MIXED  Elevated d-dimer  Pulmonary edema  LBBB (left bundle branch block)  Systolic CHF, acute   Cardiomyopathy  HTN (hypertension)  Acute renal failure  Hypotension   ASSESSMENT / PLAN:  PULMONARY  ASSESSMENT: Acute respiratory failure (resolved) RLL ATX, no clinical findings to support this being infection.   PLAN:   Shoot for even volume status after hypotension issues resolved CVP monitoring Wean FIO2 F/u serial CXR IS   CARDIOVASCULAR  ASSESSMENT:  Acute non-ischemic systolic heart failure (EF 20-25%) Shock--> favor hypovolemic shock c/b simultaneous administration of antihypertensives, poor PO intake. Do not think this is cardiogenic shock but underlying CM may require inotrope support. She got significantly nauseated after starting dopamine gtt raising concern for increased cardiac oxygen requirements and/or transient gut ischemia. She seemed to respond to volume.  Continues to have nausea 12/24.   PLAN:  Hold antihypertensives Hold diuretics Check CVP-->aim for systolic goal > 90 and CVP >8 with good mental status Titrate dopamine gtt to assist w/ BP goal Additional recs per cards  RENAL  ASSESSMENT:   Acute renal failure in setting of volume depletion and hypoperfusion, aggressive diuresis.  Renal ultrasound with no hydronephrosis.    PLAN:   Hold antihypertensives and diuretics Keep volume status even -- CVP >10 and SBP >90 Avoid nephrotoxins  Repeat BMP now (to confirm sr cr)   GASTROINTESTINAL  ASSESSMENT:   Nausea and vomiting:  Initially thought r/t dopamine as somewhat resolved w/ lower dose and volume challenge. Continues to be nauseated with decreased appetite.  Consider viral gastroenteritis, gastroparesis in setting of DM.     Diarrhea - two episodes of diarrhea over 48 hours, had abx exposure on admit.  ABX stopped 12/23.    PLAN:   Supportive care  Stool for C-Diff  HEMATOLOGIC  ASSESSMENT:   Anemia   PLAN:  F/u hgb Transfuse for hgb goal >8  INFECTIOUS  ASSESSMENT:   No evidence of PNA R/o C-Diff Gastroenteritis  PLAN:    D/c levaquin 12/23 Stool for cdiff (low suspicion with only 2 episodes over 48 hours) Hold abx Consider reglan (nausea / ? Gastroparesis)  ENDOCRINE  ASSESSMENT:    PLAN:   DM Hypothyroidism ssi Replace synthroid  NEUROLOGIC  ASSESSMENT:   No acute issue   PLAN:   Supportive care    Clinical summary 78 year female w/ newly diagnosed NICM (EF 20-25%). Moved to ICU w/ hypotension in setting of over-diuresis, complicated by antihypertensives, and poor LV function. Continue to hold antihypertensives and titrate inotropic support.  Remains on dopamine .  Pending stool for c-diff.  30 minutes CC time  Canary Brim, NP-C [facility] Pgr: [phone] or [phone]   Levy Pupa, MD, PhD 08/04/2012, 11:33 AM [facility] [phone] or if  no answer [phone]

## 2012-08-04 NOTE — Progress Notes (Signed)
[facility] PROGRESS NOTE  [name] [name] UEA:[id number] DOB: [birth year] DOA: 07/30/2012 PCP: Sanda Linger, MD  Brief narrative 78 y.o. female with history of diabetes, hypothyroidism and hypertension who was sent to the emergency room on 12/19 by her primary care physician with worsening dyspnea. The patient has no prior cardiac history. She had elevated d-dimer but CTA chest was negative for PE. In the ED, patient was found to be tachypneic with sats dropping into the 70s on 10 L nasal cannula. She received Lasix 40 mg IV, morphine 2 mg IV and, initiated BiPAP for respiratory support. Patient was also placed on a nitroglycerin drip by the emergency room physician.   Assessment/Plan:  Nausea and diarrhea  DD:S/E of Dopamine versus Viral GE, Gastroparesis & R/O Cdiff.  Taper dopamine and monitor.  Followup C. difficile PCR results.  Consider Reglan if symptoms persist.  Hypotension/shock  Likely secondary to polypharmacy (Lasix, Aldactone, Lisinopril & Carvedilol) and diuresis.  Did not improve on 12/23 after IV fluid bolus and holding culprit medications. Was transferred to step down and started on vasopressors/dopamine drip via central line and critical care team was consulted. Patient had transient abdominal pain and nausea attributed to dopamine and improved after IV fluids and reducing rate of dopamine.  Blood pressures have improved. Patient is on dopamine 3 mcg per minute. Aim to taper off dopamine as blood pressure tolerates.  PCCM input appreciated.  Acute renal failure, nonoliguric  Likely secondary to ATN from hypotension, lisinopril and diuresis.  Improved after IV fluids and vasopressors. Avoid nephrotoxic agents.  Follow BMP daily.  No hydronephrosis by ultrasound.  Acute respiratory failure  secondary to pulmonary edema- resolved after diuretics, bipap and nitro drip from 12/19 until 12/20 .   Per pulmonology, no convincing pneumonia and Levaquin  discontinued on 12/23.  CHF, acute systolic  echocardiogram result indicating severe depressed EF  patient started on diuretics on admission and symptoms have resolved.   Cardiology following. .   Patient was on oral furosemide, spironolactone, lisinopril-held until 12/22 PM but had to be held due to hypotension.   Currently compensated.  Cardiomyopathy  EF 20-25% with diffuse hypokinesis.  Cardiology following.  Continue aspirin and Lipitor. Lisinopril, diuretics and beta blockers on hold secondary to hypotension. Plan to resume low dose lisinopril when blood pressures consistently stable and off pressors.  Cardiology indicates that her picture is likely that of nonischemic dilated cardiomyopathy with low likelihood of CAD. Possible DC an outpatient followup with them in 3-4 weeks for Adenosine Myoview. (See Dr. Fabio Bering 12/21 note)  HTN  Hypotensive. All medications held.  Hypothyroidism  synthroid daily  DM   ssi for now. May resume oral meds at DC  RLL pneumonia   started on empiric levaquin on admission.  Discontinued on 12/23  Followup chest x-ray as outpatient in a couple of weeks.    Elevated D dimer   ?Cause , afebrile, will need outpt f/u .   Rheum factor highly elevated, ana pending - may need rheum outpt f/u   Anemia  Stable.  Code Status: Full Family Communication: Discussed with patient. Disposition Plan: Continue management in step down unit for now.   Consultants:  [facility]  [facility].   Procedures:  Bipap  Antibiotics:  Rocephin IV x1 dose on 12/19  Levaquin IV 12/19-12/20  By mouth Levaquin 12/21 > 12/23  HPI/Subjective: Persistent nausea but no vomiting. 2 episodes of diarrhea overnight. Denies abdominal pain. No chest pain or dyspnea.  Objective: Filed Vitals:   08/04/12 1030 08/04/12 1100 08/04/12 1130 08/04/12 1200  BP: 100/51 102/49 102/49 105/53  Pulse: 74 72 75 71   Temp:      TempSrc:      Resp: 16 16 16 15   Height:      Weight:      SpO2: 100% 99% 100% 96%      Intake/Output Summary (Last 24 hours) at 08/04/12 1206 Last data filed at 08/04/12 1201  Gross per 24 hour  Intake 1471.16 ml  Output   1950 ml  Net -478.84 ml   Filed Weights   08/02/12 0616 08/03/12 0613 08/04/12 0400  Weight: 61 kg (134 lb 7.7 oz) 63.1 kg (139 lb 1.8 oz) 64.5 kg (142 lb 3.2 oz)    Exam:   General exam: Appears uncomfortable but not in painful or respiratory distress.  Respiratory system: clear to auscultation bilaterally. No increased work of breathing.  Cardiovascular system: First and second heart sounds heard, regular rate and rhythm. No JVD, murmurs or pedal edema. Telemetry shows sinus rhythm with wide QRS and occasional NSSVT.  Gastrointestinal system: Abdomen is nondistended, soft and nontender. Normal bowel sounds heard.  Central nervous system: Alert and oriented. No focal neurological deficits.  Extremities: Symmetric 5 x 5 power.   Data Reviewed: Basic Metabolic Panel:  Lab 08/04/12 7829 08/03/12 0443 08/02/12 0434 07/31/12 0530 07/30/12 1144  NA 136 132* 136 136 140  K 3.9 3.8 3.7 3.6 4.0  CL 103 94* 96 99 105  CO2 24 22 28 24 25   GLUCOSE 168* 125* 153* 160* 266*  BUN 17 32* 20 13 12   CREATININE 1.02 3.11* 1.13* 0.90 0.9  CALCIUM 8.8 8.9 9.3 9.2 9.7  MG -- -- -- -- --  PHOS -- -- -- -- --   Liver Function Tests:  Lab 08/04/12 0300 07/31/12 0530 07/30/12 1144  AST 26 16 19   ALT 19 12 17   ALKPHOS 44 45 46  BILITOT 0.4 0.5 0.8  PROT 6.9 7.4 7.9  ALBUMIN 3.2* 3.5 4.0   No results found for this basename: LIPASE:5,AMYLASE:5 in the last 168 hours No results found for this basename: AMMONIA:5 in the last 168 hours CBC:  Lab 08/04/12 0300 08/03/12 0443 07/31/12 0530 07/30/12 1144  WBC 5.8 6.5 7.0 5.7  NEUTROABS -- -- -- 3.1  HGB 11.4* 11.9* 11.6* 12.2  HCT 34.2* 36.1 34.3* 37.8  MCV 80.5 80.2 79.2 83.0  PLT 264 250 261  258.0   Cardiac Enzymes:  Lab 07/31/12 0530 07/30/12 1904 07/30/12 1144  CKTOTAL -- -- 159  CKMB -- -- 3.0  CKMBINDEX -- -- --  TROPONINI <0.30 <0.30 0.02   BNP (last 3 results)  Basename 08/04/12 0300 08/03/12 0443 07/30/12 1144  PROBNP 1926.0* 2238.0* 674.0*   CBG:  Lab 08/04/12 0616 08/03/12 2336 08/03/12 1652 08/03/12 1221 08/03/12 0528  GLUCAP 170* 145* 213* 196* 116*    Recent Results (from the past 240 hour(s))  MRSA PCR SCREENING     Status: Normal   Collection Time   07/30/12  6:49 PM      Component Value Range Status Comment   MRSA by PCR NEGATIVE  NEGATIVE Final     Other lab data  Hemoglobin A1c: 8.3  TSH: 3.64  ANA negative  Rheumatoid factor: Greater than 600.  2-D echo Study Conclusions  - Left ventricle: The cavity size was normal. Wall thickness was increased in a pattern of mild LVH. Systolic function was severely  reduced. The estimated ejection fraction was in the range of 20% to 25%. Diffuse hypokinesis. Doppler parameters are consistent with abnormal left ventricular relaxation (grade 1 diastolic dysfunction). - Ventricular septum: Septal motion showed paradox. - Mitral valve: Moderate regurgitation. - Left atrium: The atrium was mildly dilated.   Studies: US Renal Port  08/03/2012  *RADIOLOGY REPORT*  Clinical Data: Acute renal failure  RENAL/URINARY TRACT ULTRASOUND COMPLETE  Comparison:  None.  Findings:  Right Kidney:  11.2 cm. No hydronephrosis.  Well-preserved cortex. Normal size and parenchymal echotexture without focal abnormalities.  Left Kidney:  11.2 cm. No hydronephrosis.  Well-preserved cortex. Normal size and parenchymal echotexture without focal abnormalities.  Bladder:  Physiologically distended.  Incidental note made of a right pleural effusion.  IMPRESSION:  1.  No hydronephrosis. 2.  Right pleural effusion.   Original Report Authenticated By: D. Andria Rhein, MD    Dg Chest Port 1 View  08/04/2012  *RADIOLOGY REPORT*   Clinical Data: Effusions.  PORTABLE CHEST - 1 VIEW  Comparison: 08/03/2012.  Findings: Right central line tip caval atrial junction level.  No gross pneumothorax.  Heart slightly enlarged.  Asymmetric air space disease suggestive of pulmonary edema with small right-sided pleural effusion.  Basilar subsegmental atelectasis and left mid lung zone subsegmental atelectasis.  Basilar consolidation has progressed which may represent combination of pulmonary edema and atelectasis.  Underlying infiltrate not entirely excluded.  IMPRESSION: Slight decreased aeration lung bases.  Remainder of findings without significant change.   Original Report Authenticated By: Lacy Duverney, M.D.    Dg Chest Port 1 View  08/03/2012  *RADIOLOGY REPORT*  Clinical Data: Central line placement  PORTABLE CHEST - 1 VIEW  Comparison: 08/03/2012  Findings: Right jugular catheter tip has been placed with  the tip in the upper right atrium.  No pneumothorax.  Cardiac enlargement.  Vascular congestion with mild edema has progressed from the prior study.  Small pleural effusions and mild bibasilar atelectasis are present.  IMPRESSION: Central venous catheter tip in the upper right atrium no pneumothorax  Progression of pulmonary edema.   Original Report Authenticated By: Janeece Riggers, M.D.    Dg Chest Port 1v Same Day  08/03/2012  *RADIOLOGY REPORT*  Clinical Data: CHF  PORTABLE CHEST - 1 VIEW SAME DAY  Comparison: 07/31/2012  Findings: Heart is upper normal in size allowing for portable technique and low volumes.  Bibasilar airspace disease markedly improved with some residual probable subsegmental atelectasis.  No pneumothorax.  Tiny pleural effusions suspected.  IMPRESSION: Improved bilateral lower lobe consolidation.   Original Report Authenticated By: Jolaine Click, M.D.     Scheduled Meds:    . antiseptic oral rinse  15 mL Mouth Rinse BID  . aspirin EC  81 mg Oral Daily  . atorvastatin  10 mg Oral q1800  . enoxaparin (LOVENOX)  injection  30 mg Subcutaneous Q24H  . insulin aspart  0-9 Units Subcutaneous TID AC & HS  . levothyroxine  100 mcg Oral QAC breakfast   Continuous Infusions:    . DOPamine 2 mcg/kg/min (08/04/12 1201)    Principal Problem:  *Acute respiratory failure Active Problems:  HYPOTHYROIDISM  Type II or unspecified type diabetes mellitus with neurological manifestations, uncontrolled(250.62)  DIABETIC PERIPHERAL NEUROPATHY  VITAMIN D DEFICIENCY  HYPERLIPIDEMIA, MIXED  Elevated d-dimer  Pulmonary edema  LBBB (left bundle branch block)  Systolic CHF, acute  Cardiomyopathy  HTN (hypertension)  Acute renal failure  Hypotension  Diarrhea    [facility]  [facility] Pager [phone]. If  8PM-8AM, please contact night-coverage at [email or web address], password [email or web address] 08/04/2012, 12:06 PM  LOS: 5 days

## 2012-08-04 NOTE — Progress Notes (Signed)
TELEMETRY: Reviewed telemetry pt in NSR: Filed Vitals:   08/04/12 0515 08/04/12 0539 08/04/12 0600 08/04/12 0615  BP: 99/45 107/56 110/47 105/49  Pulse: 71 74 67 71  Temp:      TempSrc:      Resp: 13 14 14 14   Height:      Weight:      SpO2: 98% 99% 97% 98%    Intake/Output Summary (Last 24 hours) at 08/04/12 0706 Last data filed at 08/04/12 0540  Gross per 24 hour  Intake 1938.36 ml  Output   1950 ml  Net -11.64 ml    SUBJECTIVE Patient reports she feels terrible. Has nausea and diarrhea. Vomited x 2 last night. No chest pain or SOB. Low grade fever.  LABS: Basic Metabolic Panel:  Basename 08/04/12 0300 08/03/12 0443  NA 136 132*  K 3.9 3.8  CL 103 94*  CO2 24 22  GLUCOSE 168* 125*  BUN 17 32*  CREATININE 1.02 3.11*  CALCIUM 8.8 8.9  MG -- --  PHOS -- --   Liver Function Tests:  Basename 08/04/12 0300  AST 26  ALT 19  ALKPHOS 44  BILITOT 0.4  PROT 6.9  ALBUMIN 3.2*   No results found for this basename: LIPASE:2,AMYLASE:2 in the last 72 hours CBC:  Basename 08/04/12 0300 08/03/12 0443  WBC 5.8 6.5  NEUTROABS -- --  HGB 11.4* 11.9*  HCT 34.2* 36.1  MCV 80.5 80.2  PLT 264 250    Radiology/Studies:  Ct Angio Chest W/cm &/or Wo Cm  07/30/2012  *RADIOLOGY REPORT*  Clinical Data: Shortness of breath.  Elevated D-dimer.  CT ANGIOGRAPHY CHEST  Technique:  Multidetector CT imaging of the chest using the standard protocol during bolus administration of intravenous contrast. Multiplanar reconstructed images including MIPs were obtained and reviewed to evaluate the vascular anatomy.  Contrast: 80mL OMNIPAQUE IOHEXOL 350 MG/ML SOLN  Comparison: None.  Findings: Satisfactory opacification of the pulmonary arteries noted, and there is no evidence of pulmonary emboli.  No evidence of thoracic aortic aneurysm.  Poor contrast opacification of thoracic aorta seen, but no definite dissection visualized.  No evidence of mediastinal hematoma or mass.  Moderate right and  small left pleural effusions are seen.  Diffuse interstitial infiltrates are seen, consistent with interstitial edema. There is also streaky opacity with air bronchograms in the right middle lobe, and superimposed pneumonia cannot be excluded.  No central endobronchial obstruction identified.  No evidence of hilar or mediastinal lymphadenopathy. Borderline cardiomegaly noted.  IMPRESSION:  1.  No evidence of pulmonary embolism. 2.  Diffuse interstitial infiltrates or edema and bilateral pleural effusions, highly suspicious for congestive heart failure. 3.  Right middle lobe infiltrate versus atelectasis. Superimposed pneumonia cannot be excluded. 4.  No mass or lymphadenopathy identified.   Original Report Authenticated By: Myles Rosenthal, M.D.    Portable Chest 1 View  07/31/2012  *RADIOLOGY REPORT*  Clinical Data: Pulmonary edema.  PORTABLE CHEST - 1 VIEW  Comparison: Chest CT 07/30/2012  Findings: Single view of the chest again demonstrates bibasilar densities.  Findings are suggestive for a combination of pleural fluid and volume loss.  Concern for airspace disease in the right lower lung.  Heart size is within normal limits.  Negative for a pneumothorax.  IMPRESSION: Bibasilar densities.  Findings suggest a combination of pleural fluid and volume loss.  There is also concern for airspace disease in the right lower lung.   Original Report Authenticated By: Richarda Overlie, M.D.     PORTABLE  CHEST - 1 VIEW  Comparison: 08/03/2012.  Findings: Right central line tip caval atrial junction level. No gross pneumothorax.  Heart slightly enlarged.  Asymmetric air space disease suggestive of pulmonary edema with small right-sided pleural effusion. Basilar subsegmental atelectasis and left mid lung zone subsegmental atelectasis.  Basilar consolidation has progressed which may represent combination of pulmonary edema and atelectasis. Underlying infiltrate not entirely excluded.  IMPRESSION: Slight decreased  aeration lung bases. Remainder of findings without significant change.   Original Report Authenticated By: Lacy Duverney, M.D. US Renal Port [[id number]] Resulted:08/03/12 1637 Order Status:Completed Updated:08/03/12 1637 Narrative: *RADIOLOGY REPORT*  Clinical Data: Acute renal failure  RENAL/URINARY TRACT ULTRASOUND COMPLETE  Comparison: None.  Findings:  Right Kidney: 11.2 cm. No hydronephrosis. Well-preserved cortex. Normal size and parenchymal echotexture without focal abnormalities.  Left Kidney: 11.2 cm. No hydronephrosis. Well-preserved cortex. Normal size and parenchymal echotexture without focal abnormalities.  Bladder: Physiologically distended.  Incidental note made of a right pleural effusion.  IMPRESSION:  1. No hydronephrosis. 2. Right pleural effusion.   Original Report Authenticated By: D. Andria Rhein, MD   ECHO: Transthoracic Echocardiography  Patient: [name] [name], [place] MR #: [id number] Study Date: 07/31/2012 Gender: F Age: 25 Height: 165.1cm Weight: 64.9kg BSA: 1.67m^2 Pt. Status: Room: 1229  PERFORMING [place], [facility] ADMITTING [place], Sorin ATTENDING [place], Sorin ORDERING [place], Sorin REFERRING [place], Sorin SONOGRAPHER [place], [place] cc:  ------------------------------------------------------------ LV EF: 20% - 25%  ------------------------------------------------------------ History: PMH: LBBB. Pulmonary edema. Risk factors: Hypothyroidism. Vitamin D deficiency. Acute respiratory failure. Osteoarthritis. Diabetes mellitus. Dyslipidemia.  ------------------------------------------------------------ Study Conclusions  - Left ventricle: The cavity size was normal. Wall thickness was increased in a pattern of mild LVH. Systolic function was severely reduced. The estimated ejection fraction was in the range of 20% to 25%. Diffuse hypokinesis. Doppler parameters are consistent with abnormal left ventricular relaxation (grade 1  diastolic dysfunction). - Ventricular septum: Septal motion showed paradox. - Mitral valve: Moderate regurgitation. - Left atrium: The atrium was mildly dilated. Transthoracic echocardiography. M-mode, complete 2D, spectral Doppler, and color Doppler. Height: Height: 165.1cm. Height: 65in. Weight: Weight: 64.9kg. Weight: 142.7lb. Body mass index: BMI: 23.8kg/m^2. Body surface area: BSA: 1.38m^2. Blood pressure: 123/66. Patient status: Inpatient. Location: ICU/CCU  ------------------------------------------------------------  ------------------------------------------------------------ Left ventricle: The cavity size was normal. Wall thickness was increased in a pattern of mild LVH. Systolic function was severely reduced. The estimated ejection fraction was in the range of 20% to 25%. Diffuse hypokinesis. Doppler parameters are consistent with abnormal left ventricular relaxation (grade 1 diastolic dysfunction).  ------------------------------------------------------------ Aortic valve: Trileaflet; mildly thickened, mildly calcified leaflets.  ------------------------------------------------------------ Aorta: Aortic root: The aortic root was normal in size. Ascending aorta: The ascending aorta was normal in size.  ------------------------------------------------------------ Mitral valve: Mildly thickened leaflets . Doppler: Moderate regurgitation. Peak gradient: 4mm Hg (D).  ------------------------------------------------------------ Left atrium: The atrium was mildly dilated.  ------------------------------------------------------------ Right ventricle: The cavity size was normal. Wall thickness was normal. Systolic function was normal.  ------------------------------------------------------------ Ventricular septum: Septal motion showed paradox.  ------------------------------------------------------------ Pulmonic valve: The valve appears to be grossly  normal.  ------------------------------------------------------------ Tricuspid valve: Mildly thickened leaflets. Doppler: Trivial regurgitation.  ------------------------------------------------------------ Right atrium: The atrium was at the upper limits of normal in size.  ------------------------------------------------------------ Pericardium: The pericardium was normal in appearance.  ------------------------------------------------------------  2D measurements Normal Doppler Normal Left ventricle measurements LVID ED, 41 mm 43-52 Left ventricle chord, Ea, lat 7.31 cm/ ------- PLAX ann, tiss s LVID ES, 40 mm 23-38 DP chord, E/Ea, lat 12.9 ------- PLAX ann, tiss  FS, chord, 2 % >29 DP PLAX Ea, med 5.36 cm/ ------- LVPW, ED 11 mm ------ ann, tiss s IVS/LVPW 1.09 <1.3 DP ratio, ED E/Ea, med 17.59 ------- Ventricular septum ann, tiss IVS, ED 12 mm ------ DP Aorta Mitral valve Root diam, 33 mm ------ Peak E vel 94.3 cm/ ------- ED s Left atrium Peak A vel 101 cm/ ------- AP dim 35 mm ------ s AP dim 2.03 cm/m^2 <2.2 Deceleratio 165 ms 150-230 index n time Vol, S 99 ml ------ Peak 4 mm ------- Vol index, 57.6 ml/m^2 ------ gradient, D Hg S Peak E/A 0.9 ------- ratio Systemic veins Estimated 20 mm ------- CVP Hg Right ventricle Sa vel, lat 8.09 cm/ ------- ann, tiss s DP  ------------------------------------------------------------ Prepared and Electronically Authenticated by  Cassell Clement    PHYSICAL EXAM General: Well developed, elderly, in no acute distress. Head: Normocephalic, atraumatic, sclera non-icteric, no xanthomas, nares are without discharge. Neck: Negative for carotid bruits. No JVD Lungs: Clear bilaterally to auscultation without wheezes, rales, or rhonchi. Decreased BS left base. Heart: RRR S1 S2 without murmurs, rubs, or gallops.  Abdomen: Soft, non-tender, non-distended with normoactive bowel sounds. No hepatomegaly. No  rebound/guarding. No obvious abdominal masses. Msk:  Strength and tone appears normal for age. Extremities: No clubbing, cyanosis or edema.  Distal pedal pulses are 2+ and equal bilaterally. Neuro: Alert and oriented X 3. Moves all extremities spontaneously. Psych:  Responds to questions appropriately with a normal affect.  ASSESSMENT AND PLAN: 1. Acute systolic CHF. EF 20-25%. BP stabilized on IV dopamine. CVP 5 mmHg. CXR yesterday improved. Will try and wean off dopamine today. Hold diuretics. If stable tomorrow would resume low dose ACEi. Need to move slowly with CHF meds.  2. Acute renal failure secondary to hypotension and meds. Creatinine recovered 1.13>>3.11>>1.02. Renal ultrasound normal. Urine output good. This is a fairly remarkable turn around in one day. Wean dopamine.  3. Low grade fever. UA negative. CXR improved. WBC normal. With N/V/diarrhea consider C. Diff.  4. RLL pneumonia.  5. Diabetes mellitus.  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

## 2012-08-05 ENCOUNTER — Inpatient Hospital Stay (HOSPITAL_COMMUNITY): Payer: Medicare Other

## 2012-08-05 DIAGNOSIS — N179 Acute kidney failure, unspecified: Secondary | ICD-10-CM | POA: Diagnosis not present

## 2012-08-05 DIAGNOSIS — I959 Hypotension, unspecified: Secondary | ICD-10-CM | POA: Diagnosis not present

## 2012-08-05 DIAGNOSIS — J9600 Acute respiratory failure, unspecified whether with hypoxia or hypercapnia: Secondary | ICD-10-CM

## 2012-08-05 DIAGNOSIS — I428 Other cardiomyopathies: Secondary | ICD-10-CM | POA: Diagnosis not present

## 2012-08-05 DIAGNOSIS — J811 Chronic pulmonary edema: Secondary | ICD-10-CM | POA: Diagnosis not present

## 2012-08-05 DIAGNOSIS — R197 Diarrhea, unspecified: Secondary | ICD-10-CM | POA: Diagnosis not present

## 2012-08-05 DIAGNOSIS — J90 Pleural effusion, not elsewhere classified: Secondary | ICD-10-CM | POA: Diagnosis not present

## 2012-08-05 DIAGNOSIS — I509 Heart failure, unspecified: Secondary | ICD-10-CM | POA: Diagnosis not present

## 2012-08-05 LAB — BASIC METABOLIC PANEL
Calcium: 9 mg/dL (ref 8.4–10.5)
Creatinine, Ser: 0.81 mg/dL (ref 0.50–1.10)
GFR calc Af Amer: 80 mL/min — ABNORMAL LOW (ref >90)
GFR calc non Af Amer: 69 mL/min — ABNORMAL LOW (ref >90)

## 2012-08-05 LAB — GLUCOSE, CAPILLARY
Glucose-Capillary: 275 mg/dL — ABNORMAL HIGH (ref 70–99)
Glucose-Capillary: 261 mg/dL — ABNORMAL HIGH (ref 70–99)
Glucose-Capillary: 255 mg/dL — ABNORMAL HIGH (ref 70–99)

## 2012-08-05 MED ORDER — INSULIN ASPART 100 UNIT/ML ~~LOC~~ SOLN
0.0000 [IU] | Freq: Every day | SUBCUTANEOUS | Status: DC
  Administered 2012-08-05: 3 [IU] via SUBCUTANEOUS

## 2012-08-05 MED ORDER — INSULIN ASPART 100 UNIT/ML ~~LOC~~ SOLN
0.0000 [IU] | Freq: Three times a day (TID) | SUBCUTANEOUS | Status: DC
  Administered 2012-08-06: 3 [IU] via SUBCUTANEOUS
  Administered 2012-08-06: 9 [IU] via SUBCUTANEOUS
  Administered 2012-08-06: 2 [IU] via SUBCUTANEOUS
  Administered 2012-08-07: 3 [IU] via SUBCUTANEOUS
  Administered 2012-08-07: 2 [IU] via SUBCUTANEOUS

## 2012-08-05 NOTE — Progress Notes (Signed)
PULMONARY  / CRITICAL CARE MEDICINE  Name: [name] [name] MRN: [id number] DOB: 11-28-1933    LOS: 6  REFERRING MD :  Dr. Waymon Amato  CHIEF COMPLAINT:  Heart failure and hypotension.   BRIEF PATIENT DESCRIPTION:  78 year old female with newly diagnosed systolic heart failure w/ resultant pulmonary edema on 12/19. She was treated supportively w/ NIPPV and diuresis. Additional medical therapy w/ BB and ACE-I also added. She had improved significantly, but on 12/23 developed hypotension that did not respond to 1 liter NS replacement. PCCM asked to assist w/ her care.   LINES / TUBES: Right IJ CVL 12/23>>>  CULTURES: mrsa swab 12/19>>>negative   ANTIBIOTICS: levaquin 12/19>>>12/23  SIGNIFICANT EVENTS:  Echo 12/20: EF 20-25%, diastolic dysfxn gd I, diffuse hypokinesis   LEVEL OF CARE:  ICU  PRIMARY SERVICE:  [facility]  CONSULTANTS:  Cardiology and PCCM  CODE STATUS: full  DIET:  NAS  DVT Px:  LMWH  GI Px:     INTERVAL HISTORY:  Pt is better and is off vasopressors  VITAL SIGNS: Temp:  [97.8 F (36.6 C)-99 F (37.2 C)] 97.9 F (36.6 C) (12/25 0800) Pulse Rate:  [67-78] 69  (12/25 0800) Resp:  [14-19] 17  (12/25 0800) BP: (89-105)/(41-76) 97/54 mmHg (12/25 0800) SpO2:  [93 %-100 %] 98 % (12/25 0800) Weight:  [64.8 kg (142 lb 13.7 oz)] 64.8 kg (142 lb 13.7 oz) (12/25 0400)  HEMODYNAMICS: CVP:  [8 mmHg-12 mmHg] 9 mmHg  On RA.    INTAKE / OUTPUT: Intake/Output      12/24 0701 - 12/25 0700 12/25 0701 - 12/26 0700   P.O. 125    I.V. (mL/kg) 18.6 (0.3)    Other 770    IV Piggyback     Total Intake(mL/kg) 913.6 (14.1)    Urine (mL/kg/hr) 600 (0.4)    Total Output 600    Net +313.6         Stool Occurrence 1 x      PHYSICAL EXAMINATION: General:  wdwn adult female in NAD Neuro:  Awake, oriented, no focal def  HEENT:  [place], no JVD, no adenopathy  Cardiovascular:  rrr Lungs:  resp's even/non-labored, lungs bilaterally with decreased few fine crackles bases Abdomen:   Non-tender, decreased bowel sounds  Musculoskeletal:  Intact  Skin:  Intact    LABS: Cbc  Lab 08/04/12 0300 08/03/12 0443 07/31/12 0530  WBC 5.8 -- --  HGB 11.4* 11.9* 11.6*  HCT 34.2* 36.1 34.3*  PLT 264 250 261   Chemistry  Lab 08/05/12 0500 08/04/12 1135 08/04/12 0300  NA 136 137 136  K 3.8 3.9 3.9  CL 106 106 103  CO2 25 24 24   BUN 11 14 17   CREATININE 0.81 0.88 1.02  CALCIUM 9.0 8.8 8.8  MG -- -- --  PHOS -- -- --  GLUCOSE 106* 127* 168*   Liver fxn  Lab 08/04/12 0300 07/31/12 0530 07/30/12 1144  AST 26 16 19   ALT 19 12 17   ALKPHOS 44 45 46  BILITOT 0.4 0.5 0.8  PROT 6.9 7.4 7.9  ALBUMIN 3.2* 3.5 4.0   coags No results found for this basename: APTT:3,INR:3 in the last 168 hours  Sepsis markers No results found for this basename: LATICACIDVEN:3,PROCALCITON:3 in the last 168 hours  Cardiac markers  Lab 07/31/12 0530 07/30/12 1904 07/30/12 1144  CKTOTAL -- -- 159  CKMB -- -- 3.0  TROPONINI <0.30 <0.30 0.02   BNP  Lab 08/04/12 0300 08/03/12 0443 07/30/12 1144  PROBNP 1926.0* 2238.0* 674.0*   ABG No results found for this basename: PHART:3,PCO2ART:3,PO2ART:3,HCO3:3,TCO2:3 in the last 168 hours  CBG trend  Lab 08/05/12 0749 08/04/12 2202 08/04/12 1717 08/04/12 1157 08/04/12 0616  GLUCAP 114* 139* 169* 130* 170*    IMAGING: 12/25: CM, mild edema, pleural effusions RLL  ECG: none  DIAGNOSES: Principal Problem:  *Acute respiratory failure Active Problems:  HYPOTHYROIDISM  Type II or unspecified type diabetes mellitus with neurological manifestations, uncontrolled(250.62)  DIABETIC PERIPHERAL NEUROPATHY  VITAMIN D DEFICIENCY  HYPERLIPIDEMIA, MIXED  Elevated d-dimer  Pulmonary edema  LBBB (left bundle branch block)  Systolic CHF, acute  Cardiomyopathy  HTN (hypertension)  Acute renal failure  Hypotension  Diarrhea   ASSESSMENT / PLAN:  PULMONARY  ASSESSMENT: Acute respiratory failure (resolved) RLL ATX, no clinical findings to  support this being infection.   PLAN:   F/u cxr  CARDIOVASCULAR  ASSESSMENT:  Acute non-ischemic systolic heart failure (EF 20-25%) Shock--> favor hypovolemic shock c/b simultaneous administration of antihypertensives, poor PO intake. Do not think this is cardiogenic shock but underlying CM may require inotrope support. She got significantly nauseated after starting dopamine gtt raising concern for increased cardiac oxygen requirements and/or transient gut ischemia. She seemed to respond to volume.  No nausea on 12/25  PLAN:  Off DA Per cardiology Ok with pccm to tfr to tele bed ?d/c cvl : ok with pccm   RENAL  ASSESSMENT:   Acute renal failure in setting of volume depletion and hypoperfusion, aggressive diuresis.  Renal ultrasound with no hydronephrosis.    PLAN:   Hold antihypertensives and diuretics>>>per cardiology Keep volume status even -- CVP >10 and SBP >90 Avoid nephrotoxins  Repeat BMP now (to confirm sr cr)   GASTROINTESTINAL  ASSESSMENT:   Nausea and vomiting:improved Diarrhea - two episodes of diarrhea over 48 hours, had abx exposure on admit.  ABX stopped 12/23.   C diff Stool neg.  PLAN:   Supportive care   HEMATOLOGIC  ASSESSMENT:   Anemia   PLAN:  F/u hgb Transfuse for hgb goal >8  INFECTIOUS  ASSESSMENT:   No evidence of PNA Gastroenteritis  PLAN:   D/c levaquin 12/23 Hold abx Consider reglan (nausea / ? Gastroparesis)  ENDOCRINE  ASSESSMENT:    PLAN:   DM Hypothyroidism ssi  synthroid  NEUROLOGIC  ASSESSMENT:   No acute issue   PLAN:   Supportive care    Clinical summary 78 year female w/ newly diagnosed NICM (EF 20-25%). Moved to ICU w/ hypotension in setting of over-diuresis, complicated by antihypertensives, and poor LV function.   Cdiff neg.  Off DA.   30 minutes CC time  Caryl Bis  [phone]  Cell  [phone]  If no response or cell goes to voicemail, call beeper [phone]  08/05/2012,  9:21 AM [facility]

## 2012-08-05 NOTE — Progress Notes (Signed)
[facility] PROGRESS NOTE  [name] [name] XBJ:[id number] DOB: Dec 21, 1933 DOA: 07/30/2012 PCP: Sanda Linger, MD  Brief narrative 78 y.o. female with history of diabetes, hypothyroidism and hypertension who was sent to the emergency room on 12/19 by her primary care physician with worsening dyspnea. The patient has no prior cardiac history. She had elevated d-dimer but CTA chest was negative for PE. In the ED, patient was found to be tachypneic with sats dropping into the 70s on 10 L nasal cannula. She received Lasix 40 mg IV, morphine 2 mg IV and, initiated BiPAP for respiratory support. Patient was also placed on a nitroglycerin drip by the emergency room physician.   Assessment/Plan:  Nausea and diarrhea  DD:S/E of Dopamine versus Viral GE, Gastroparesis & R/O Cdiff.  Resolved.  C. difficile PCR negative. Of dopamine. Tolerating diet.  Hypotension/shock  Likely secondary to polypharmacy (Lasix, Aldactone, Lisinopril & Carvedilol), over diuresis and cardiomyopathy.  Did not improve on 12/23 after IV fluid bolus and holding culprit medications. Was transferred to step down and started on vasopressors/dopamine drip via central line and critical care team was consulted. Patient had transient abdominal pain and nausea attributed to dopamine and improved after IV fluids and reducing rate of dopamine.  PCCM and cardiology input appreciated.  Of dopamine since 12/24. Blood pressures are still soft but stable and asymptomatic.  Keep central line until 12/26, then d/c if no further need.  Acute renal failure, nonoliguric  Likely secondary to ATN from hypotension, lisinopril and diuresis.  Resolved after IV fluids and vasopressors. Avoid nephrotoxic agents.  No hydronephrosis by ultrasound.  Acute respiratory failure  secondary to pulmonary edema- resolved after diuretics, bipap and nitro drip from 12/19 until 12/20 .   Per pulmonology, no convincing pneumonia and Levaquin  discontinued on 12/23.  CHF, acute systolic  echocardiogram result indicating severe depressed EF  patient started on diuretics on admission and symptoms have resolved.   Cardiology following. .   Patient was on oral furosemide, spironolactone, lisinopril-held until 12/22 PM but had to be held due to hypotension.   Currently compensated.  Cardiomyopathy  EF 20-25% with diffuse hypokinesis.  Cardiology following.  Continue aspirin and Lipitor. Lisinopril, diuretics and beta blockers on hold secondary to hypotension. Plan to resume low dose lisinopril when blood pressures consistently stable and off pressors.  Cardiology indicates that her picture is likely that of nonischemic dilated cardiomyopathy with low likelihood of CAD. Possible DC an outpatient followup with them in 3-4 weeks for Adenosine Myoview. (See Dr. Fabio Bering 12/21 note)  HTN  Hypotensive. All medications held.  Hypothyroidism  synthroid daily  DM   ssi for now. May resume oral meds at DC  RLL pneumonia   started on empiric levaquin on admission.  Discontinued on 12/23  Followup chest x-ray as outpatient in a couple of weeks.    Elevated D dimer   ?Cause , afebrile, will need outpt f/u .   Rheum factor highly elevated, ana pending - may need rheum outpt f/u   Anemia  Stable.  Code Status: Full Family Communication: Discussed with patient. Disposition Plan: Transfer to telemetry.   Consultants:  [facility]   pulmonary and critical care medicine.   Procedures:  Bipap  Antibiotics:  Rocephin IV x1 dose on 12/19  Levaquin IV 12/19-12/20  By mouth Levaquin 12/21 > 12/23  HPI/Subjective: Feels much better. Denies nausea, vomiting, abdominal pain or diarrhea. Indicates normal consistency stool last night. Tolerating diet. No chest pain or dyspnea.  Objective:  Filed Vitals:   08/05/12 0500 08/05/12 0600 08/05/12 0700 08/05/12 0800  BP: 95/44 102/50 97/47 97/54    Pulse: 67 74 67 69  Temp:    97.9 F (36.6 C)  TempSrc:    Oral  Resp: 16 16 14 17   Height:      Weight:      SpO2: 94% 96% 95% 98%      Intake/Output Summary (Last 24 hours) at 08/05/12 1141 Last data filed at 08/05/12 0600  Gross per 24 hour  Intake  699.6 ml  Output    600 ml  Net   99.6 ml   Filed Weights   08/03/12 0613 08/04/12 0400 08/05/12 0400  Weight: 63.1 kg (139 lb 1.8 oz) 64.5 kg (142 lb 3.2 oz) 64.8 kg (142 lb 13.7 oz)    Exam:   General exam: Looks much improved. Pleasant and comfortable.  Respiratory system: clear to auscultation bilaterally. No increased work of breathing.  Cardiovascular system: First and second heart sounds heard, regular rate and rhythm. No JVD, murmurs or pedal edema. Telemetry shows sinus rhythm with wide QRS.  Gastrointestinal system: Abdomen is nondistended, soft and nontender. Normal bowel sounds heard.  Central nervous system: Alert and oriented. No focal neurological deficits.  Extremities: Symmetric 5 x 5 power.   Data Reviewed: Basic Metabolic Panel:  Lab 08/05/12 1610 08/04/12 1135 08/04/12 0300 08/03/12 0443 08/02/12 0434  NA 136 137 136 132* 136  K 3.8 3.9 3.9 3.8 3.7  CL 106 106 103 94* 96  CO2 25 24 24 22 28   GLUCOSE 106* 127* 168* 125* 153*  BUN 11 14 17  32* 20  CREATININE 0.81 0.88 1.02 3.11* 1.13*  CALCIUM 9.0 8.8 8.8 8.9 9.3  MG -- -- -- -- --  PHOS -- -- -- -- --   Liver Function Tests:  Lab 08/04/12 0300 07/31/12 0530 07/30/12 1144  AST 26 16 19   ALT 19 12 17   ALKPHOS 44 45 46  BILITOT 0.4 0.5 0.8  PROT 6.9 7.4 7.9  ALBUMIN 3.2* 3.5 4.0   No results found for this basename: LIPASE:5,AMYLASE:5 in the last 168 hours No results found for this basename: AMMONIA:5 in the last 168 hours CBC:  Lab 08/04/12 0300 08/03/12 0443 07/31/12 0530 07/30/12 1144  WBC 5.8 6.5 7.0 5.7  NEUTROABS -- -- -- 3.1  HGB 11.4* 11.9* 11.6* 12.2  HCT 34.2* 36.1 34.3* 37.8  MCV 80.5 80.2 79.2 83.0  PLT 264 250 261  258.0   Cardiac Enzymes:  Lab 07/31/12 0530 07/30/12 1904 07/30/12 1144  CKTOTAL -- -- 159  CKMB -- -- 3.0  CKMBINDEX -- -- --  TROPONINI <0.30 <0.30 0.02   BNP (last 3 results)  Basename 08/04/12 0300 08/03/12 0443 07/30/12 1144  PROBNP 1926.0* 2238.0* 674.0*   CBG:  Lab 08/05/12 0749 08/04/12 2202 08/04/12 1717 08/04/12 1157 08/04/12 0616  GLUCAP 114* 139* 169* 130* 170*    Recent Results (from the past 240 hour(s))  MRSA PCR SCREENING     Status: Normal   Collection Time   07/30/12  6:49 PM      Component Value Range Status Comment   MRSA by PCR NEGATIVE  NEGATIVE Final   CLOSTRIDIUM DIFFICILE BY PCR     Status: Normal   Collection Time   08/04/12  4:15 PM      Component Value Range Status Comment   C difficile by pcr NEGATIVE  NEGATIVE Final     Other lab data  Hemoglobin  A1c: 8.3  TSH: 3.64  ANA negative  Rheumatoid factor: Greater than 600.  2-D echo Study Conclusions  - Left ventricle: The cavity size was normal. Wall thickness was increased in a pattern of mild LVH. Systolic function was severely reduced. The estimated ejection fraction was in the range of 20% to 25%. Diffuse hypokinesis. Doppler parameters are consistent with abnormal left ventricular relaxation (grade 1 diastolic dysfunction). - Ventricular septum: Septal motion showed paradox. - Mitral valve: Moderate regurgitation. - Left atrium: The atrium was mildly dilated.   Studies: US Renal Port  08/03/2012  *RADIOLOGY REPORT*  Clinical Data: Acute renal failure  RENAL/URINARY TRACT ULTRASOUND COMPLETE  Comparison:  None.  Findings:  Right Kidney:  11.2 cm. No hydronephrosis.  Well-preserved cortex. Normal size and parenchymal echotexture without focal abnormalities.  Left Kidney:  11.2 cm. No hydronephrosis.  Well-preserved cortex. Normal size and parenchymal echotexture without focal abnormalities.  Bladder:  Physiologically distended.  Incidental note made of a right pleural effusion.   IMPRESSION:  1.  No hydronephrosis. 2.  Right pleural effusion.   Original Report Authenticated By: D. Andria Rhein, MD    Dg Chest Port 1 View  08/05/2012  *RADIOLOGY REPORT*  Clinical Data: Evaluate infiltrates and/or edema  PORTABLE CHEST - 1 VIEW  Comparison: Portable chest x-ray of 08/04/2012  Findings:  There is little change in poor aeration with basilar haziness consistent with atelectasis and effusions.  There is cardiomegaly present with moderate pulmonary vascular congestion. Right central venous line tip overlies the lower SVC.  No bony abnormality is seen.  IMPRESSION: No significant change in cardiomegaly and pulmonary vascular congestion with effusions and basilar atelectasis.   Original Report Authenticated By: Dwyane Dee, M.D.    Dg Chest Port 1 View  08/04/2012  *RADIOLOGY REPORT*  Clinical Data: Effusions.  PORTABLE CHEST - 1 VIEW  Comparison: 08/03/2012.  Findings: Right central line tip caval atrial junction level.  No gross pneumothorax.  Heart slightly enlarged.  Asymmetric air space disease suggestive of pulmonary edema with small right-sided pleural effusion.  Basilar subsegmental atelectasis and left mid lung zone subsegmental atelectasis.  Basilar consolidation has progressed which may represent combination of pulmonary edema and atelectasis.  Underlying infiltrate not entirely excluded.  IMPRESSION: Slight decreased aeration lung bases.  Remainder of findings without significant change.   Original Report Authenticated By: Lacy Duverney, M.D.    Dg Chest Port 1 View  08/03/2012  *RADIOLOGY REPORT*  Clinical Data: Central line placement  PORTABLE CHEST - 1 VIEW  Comparison: 08/03/2012  Findings: Right jugular catheter tip has been placed with  the tip in the upper right atrium.  No pneumothorax.  Cardiac enlargement.  Vascular congestion with mild edema has progressed from the prior study.  Small pleural effusions and mild bibasilar atelectasis are present.  IMPRESSION: Central  venous catheter tip in the upper right atrium no pneumothorax  Progression of pulmonary edema.   Original Report Authenticated By: Janeece Riggers, M.D.     Scheduled Meds:    . antiseptic oral rinse  15 mL Mouth Rinse BID  . aspirin EC  81 mg Oral Daily  . atorvastatin  10 mg Oral q1800  . enoxaparin (LOVENOX) injection  40 mg Subcutaneous Q24H  . insulin aspart  0-9 Units Subcutaneous TID AC & HS  . levothyroxine  100 mcg Oral QAC breakfast   Continuous Infusions:    Principal Problem:  *Acute respiratory failure Active Problems:  HYPOTHYROIDISM  Type II or unspecified type diabetes mellitus  with neurological manifestations, uncontrolled(250.62)  DIABETIC PERIPHERAL NEUROPATHY  VITAMIN D DEFICIENCY  HYPERLIPIDEMIA, MIXED  Elevated d-dimer  Pulmonary edema  LBBB (left bundle branch block)  Systolic CHF, acute  Cardiomyopathy  HTN (hypertension)  Acute renal failure  Hypotension  Diarrhea    [facility]  [facility] Pager [phone]. If 8PM-8AM, please contact night-coverage at [email or web address], password [facility] 08/05/2012, 11:41 AM  LOS: 6 days

## 2012-08-05 NOTE — Progress Notes (Signed)
TELEMETRY: Reviewed telemetry pt in NSR: Filed Vitals:   08/05/12 0200 08/05/12 0400 08/05/12 0500 08/05/12 0600  BP: 89/54 97/42 95/44  102/50  Pulse: 76 73 67 74  Temp:  99 F (37.2 C)    TempSrc:  Oral    Resp: 18 19 16 16   Height:      Weight:  142 lb 13.7 oz (64.8 kg)    SpO2: 94% 95% 94% 96%    Intake/Output Summary (Last 24 hours) at 08/05/12 0651 Last data filed at 08/05/12 0600  Gross per 24 hour  Intake  967.1 ml  Output    600 ml  Net  367.1 ml    SUBJECTIVE Patient reports she feels much better. No nausea or diarrhea. Denies SOB or chest pain. Wants central line out.  LABS: Basic Metabolic Panel:  Basename 08/05/12 0500 08/04/12 1135  NA 136 137  K 3.8 3.9  CL 106 106  CO2 25 24  GLUCOSE 106* 127*  BUN 11 14  CREATININE 0.81 0.88  CALCIUM 9.0 8.8  MG -- --  PHOS -- --   Liver Function Tests:  Basename 08/04/12 0300  AST 26  ALT 19  ALKPHOS 44  BILITOT 0.4  PROT 6.9  ALBUMIN 3.2*   No results found for this basename: LIPASE:2,AMYLASE:2 in the last 72 hours CBC:  Basename 08/04/12 0300 08/03/12 0443  WBC 5.8 6.5  NEUTROABS -- --  HGB 11.4* 11.9*  HCT 34.2* 36.1  MCV 80.5 80.2  PLT 264 250    Radiology/Studies:  Ct Angio Chest W/cm &/or Wo Cm  07/30/2012  *RADIOLOGY REPORT*  Clinical Data: Shortness of breath.  Elevated D-dimer.  CT ANGIOGRAPHY CHEST  Technique:  Multidetector CT imaging of the chest using the standard protocol during bolus administration of intravenous contrast. Multiplanar reconstructed images including MIPs were obtained and reviewed to evaluate the vascular anatomy.  Contrast: 80mL OMNIPAQUE IOHEXOL 350 MG/ML SOLN  Comparison: None.  Findings: Satisfactory opacification of the pulmonary arteries noted, and there is no evidence of pulmonary emboli.  No evidence of thoracic aortic aneurysm.  Poor contrast opacification of thoracic aorta seen, but no definite dissection visualized.  No evidence of mediastinal hematoma or  mass.  Moderate right and small left pleural effusions are seen.  Diffuse interstitial infiltrates are seen, consistent with interstitial edema. There is also streaky opacity with air bronchograms in the right middle lobe, and superimposed pneumonia cannot be excluded.  No central endobronchial obstruction identified.  No evidence of hilar or mediastinal lymphadenopathy. Borderline cardiomegaly noted.  IMPRESSION:  1.  No evidence of pulmonary embolism. 2.  Diffuse interstitial infiltrates or edema and bilateral pleural effusions, highly suspicious for congestive heart failure. 3.  Right middle lobe infiltrate versus atelectasis. Superimposed pneumonia cannot be excluded. 4.  No mass or lymphadenopathy identified.   Original Report Authenticated By: Myles Rosenthal, M.D.    Portable Chest 1 View  07/31/2012  *RADIOLOGY REPORT*  Clinical Data: Pulmonary edema.  PORTABLE CHEST - 1 VIEW  Comparison: Chest CT 07/30/2012  Findings: Single view of the chest again demonstrates bibasilar densities.  Findings are suggestive for a combination of pleural fluid and volume loss.  Concern for airspace disease in the right lower lung.  Heart size is within normal limits.  Negative for a pneumothorax.  IMPRESSION: Bibasilar densities.  Findings suggest a combination of pleural fluid and volume loss.  There is also concern for airspace disease in the right lower lung.   Original Report Authenticated By:  Richarda Overlie, M.D.     PORTABLE CHEST - 1 VIEW  Comparison: 08/03/2012.  Findings: Right central line tip caval atrial junction level. No gross pneumothorax.  Heart slightly enlarged.  Asymmetric air space disease suggestive of pulmonary edema with small right-sided pleural effusion. Basilar subsegmental atelectasis and left mid lung zone subsegmental atelectasis.  Basilar consolidation has progressed which may represent combination of pulmonary edema and atelectasis. Underlying infiltrate not entirely  excluded.  IMPRESSION: Slight decreased aeration lung bases. Remainder of findings without significant change.   Original Report Authenticated By: Lacy Duverney, M.D. US Renal Port [[id number]] Resulted:08/03/12 1637 Order Status:Completed Updated:08/03/12 1637 Narrative: *RADIOLOGY REPORT*  Clinical Data: Acute renal failure  RENAL/URINARY TRACT ULTRASOUND COMPLETE  Comparison: None.  Findings:  Right Kidney: 11.2 cm. No hydronephrosis. Well-preserved cortex. Normal size and parenchymal echotexture without focal abnormalities.  Left Kidney: 11.2 cm. No hydronephrosis. Well-preserved cortex. Normal size and parenchymal echotexture without focal abnormalities.  Bladder: Physiologically distended.  Incidental note made of a right pleural effusion.  IMPRESSION:  1. No hydronephrosis. 2. Right pleural effusion.   Original Report Authenticated By: D. Andria Rhein, MD   ECHO: Transthoracic Echocardiography  Patient: [name] [name], [name] [name] MR #: [id number] Study Date: 07/31/2012 Gender: F Age: 78 Height: 165.1cm Weight: 64.9kg BSA: 1.22m^2 Pt. Status: Room: 1229  PERFORMING [place], [facility] ADMITTING [place], [place] ATTENDING [place], Sorin ORDERING [place], [place] REFERRING [place], [place] SONOGRAPHER [place], [place] cc:  ------------------------------------------------------------ LV EF: 20% - 25%  ------------------------------------------------------------ History: PMH: LBBB. Pulmonary edema. Risk factors: Hypothyroidism. Vitamin D deficiency. Acute respiratory failure. Osteoarthritis. Diabetes mellitus. Dyslipidemia.  ------------------------------------------------------------ Study Conclusions  - Left ventricle: The cavity size was normal. Wall thickness was increased in a pattern of mild LVH. Systolic function was severely reduced. The estimated ejection fraction was in the range of 20% to 25%. Diffuse hypokinesis. Doppler parameters are consistent with abnormal  left ventricular relaxation (grade 1 diastolic dysfunction). - Ventricular septum: Septal motion showed paradox. - Mitral valve: Moderate regurgitation. - Left atrium: The atrium was mildly dilated. Transthoracic echocardiography. M-mode, complete 2D, spectral Doppler, and color Doppler. Height: Height: 165.1cm. Height: 65in. Weight: Weight: 64.9kg. Weight: 142.7lb. Body mass index: BMI: 23.8kg/m^2. Body surface area: BSA: 1.62m^2. Blood pressure: 123/66. Patient status: Inpatient. Location: ICU/CCU  ------------------------------------------------------------  ------------------------------------------------------------ Left ventricle: The cavity size was normal. Wall thickness was increased in a pattern of mild LVH. Systolic function was severely reduced. The estimated ejection fraction was in the range of 20% to 25%. Diffuse hypokinesis. Doppler parameters are consistent with abnormal left ventricular relaxation (grade 1 diastolic dysfunction).  ------------------------------------------------------------ Aortic valve: Trileaflet; mildly thickened, mildly calcified leaflets.  ------------------------------------------------------------ Aorta: Aortic root: The aortic root was normal in size. Ascending aorta: The ascending aorta was normal in size.  ------------------------------------------------------------ Mitral valve: Mildly thickened leaflets . Doppler: Moderate regurgitation. Peak gradient: 4mm Hg (D).  ------------------------------------------------------------ Left atrium: The atrium was mildly dilated.  ------------------------------------------------------------ Right ventricle: The cavity size was normal. Wall thickness was normal. Systolic function was normal.  ------------------------------------------------------------ Ventricular septum: Septal motion showed paradox.  ------------------------------------------------------------ Pulmonic valve: The valve  appears to be grossly normal.  ------------------------------------------------------------ Tricuspid valve: Mildly thickened leaflets. Doppler: Trivial regurgitation.  ------------------------------------------------------------ Right atrium: The atrium was at the upper limits of normal in size.  ------------------------------------------------------------ Pericardium: The pericardium was normal in appearance.  ------------------------------------------------------------  2D measurements Normal Doppler Normal Left ventricle measurements LVID ED, 41 mm 43-52 Left ventricle chord, Ea, lat 7.31 cm/ ------- PLAX ann, tiss s LVID ES, 40 mm 23-38 DP  chord, E/Ea, lat 12.9 ------- PLAX ann, tiss FS, chord, 2 % >29 DP PLAX Ea, med 5.36 cm/ ------- LVPW, ED 11 mm ------ ann, tiss s IVS/LVPW 1.09 <1.3 DP ratio, ED E/Ea, med 17.59 ------- Ventricular septum ann, tiss IVS, ED 12 mm ------ DP Aorta Mitral valve Root diam, 33 mm ------ Peak E vel 94.3 cm/ ------- ED s Left atrium Peak A vel 101 cm/ ------- AP dim 35 mm ------ s AP dim 2.03 cm/m^2 <2.2 Deceleratio 165 ms 150-230 index n time Vol, S 99 ml ------ Peak 4 mm ------- Vol index, 57.6 ml/m^2 ------ gradient, D Hg S Peak E/A 0.9 ------- ratio Systemic veins Estimated 20 mm ------- CVP Hg Right ventricle Sa vel, lat 8.09 cm/ ------- ann, tiss s DP  ------------------------------------------------------------ Prepared and Electronically Authenticated by  Cassell Clement    PHYSICAL EXAM General: Well developed, elderly, in no acute distress. Head: Normocephalic, atraumatic, sclera non-icteric, no xanthomas, nares are without discharge. Neck: Negative for carotid bruits. No JVD. Central line in right IJ. Lungs: Clear bilaterally to auscultation without wheezes, rales, or rhonchi. Decreased BS left base. Heart: RRR S1 S2 without murmurs, rubs, or gallops.  Abdomen: Soft, non-tender, non-distended with normoactive  bowel sounds. No hepatomegaly. No rebound/guarding. No obvious abdominal masses. Msk:  Strength and tone appears normal for age. Extremities: No clubbing, cyanosis or edema.  Distal pedal pulses are 2+ and equal bilaterally. Neuro: Alert and oriented X 3. Moves all extremities spontaneously. Psych:  Responds to questions appropriately with a normal affect.  ASSESSMENT AND PLAN: 1. Acute systolic CHF. EF 20-25%. BP stabilized off IV dopamine but still soft. CVP 9 mmHg. I would continue to hold ACEi and diuretics today. Move to telemetry. Ambulate. ? Resume low dose lisinopril in am.  2. Acute renal failure secondary to hypotension and meds. Creatinine recovered 1.13>>3.11>>1.02>>.81. Renal ultrasound normal. Urine output good.   3. Low grade fever- improved. UA negative. CXR improved. WBC normal. C. Diff is negative.  4. RLL pneumonia.  5. Diabetes mellitus.  Principal Problem:  *Acute respiratory failure Active Problems:  HYPOTHYROIDISM  Type II or unspecified type diabetes mellitus with neurological manifestations, uncontrolled(250.62)  DIABETIC PERIPHERAL NEUROPATHY  VITAMIN D DEFICIENCY  HYPERLIPIDEMIA, MIXED  Elevated d-dimer  Pulmonary edema  LBBB (left bundle branch block)  Systolic CHF, acute  Cardiomyopathy  HTN (hypertension)  Acute renal failure  Hypotension  Diarrhea    Signed, Kacey Vicuna [place] MD,FACC 08/05/2012 6:51 AM

## 2012-08-06 DIAGNOSIS — N179 Acute kidney failure, unspecified: Secondary | ICD-10-CM | POA: Diagnosis not present

## 2012-08-06 DIAGNOSIS — I509 Heart failure, unspecified: Secondary | ICD-10-CM | POA: Diagnosis not present

## 2012-08-06 DIAGNOSIS — J9600 Acute respiratory failure, unspecified whether with hypoxia or hypercapnia: Secondary | ICD-10-CM | POA: Diagnosis not present

## 2012-08-06 DIAGNOSIS — R197 Diarrhea, unspecified: Secondary | ICD-10-CM | POA: Diagnosis not present

## 2012-08-06 DIAGNOSIS — I959 Hypotension, unspecified: Secondary | ICD-10-CM | POA: Diagnosis not present

## 2012-08-06 DIAGNOSIS — I5021 Acute systolic (congestive) heart failure: Secondary | ICD-10-CM | POA: Diagnosis not present

## 2012-08-06 LAB — GLUCOSE, CAPILLARY
Glucose-Capillary: 185 mg/dL — ABNORMAL HIGH (ref 70–99)
Glucose-Capillary: 129 mg/dL — ABNORMAL HIGH (ref 70–99)

## 2012-08-06 LAB — CBC
HCT: 31.7 % — ABNORMAL LOW (ref 36.0–46.0)
Platelets: 234 10*3/uL (ref 150–400)
RDW: 15.7 % — ABNORMAL HIGH (ref 11.5–15.5)
WBC: 4.6 10*3/uL (ref 4.0–10.5)

## 2012-08-06 LAB — BASIC METABOLIC PANEL
Chloride: 101 mEq/L (ref 96–112)
Creatinine, Ser: 0.8 mg/dL (ref 0.50–1.10)
GFR calc Af Amer: 81 mL/min — ABNORMAL LOW (ref >90)
GFR calc non Af Amer: 70 mL/min — ABNORMAL LOW (ref >90)

## 2012-08-06 MED ORDER — LISINOPRIL 2.5 MG PO TABS
2.5000 mg | ORAL_TABLET | Freq: Every day | ORAL | Status: DC
  Administered 2012-08-06 – 2012-08-07 (×2): 2.5 mg via ORAL
  Filled 2012-08-06 (×2): qty 1

## 2012-08-06 MED ORDER — SODIUM CHLORIDE 0.9 % IJ SOLN
10.0000 mL | INTRAMUSCULAR | Status: DC | PRN
  Administered 2012-08-06 (×3): 10 mL

## 2012-08-06 MED ORDER — FUROSEMIDE 20 MG PO TABS
20.0000 mg | ORAL_TABLET | Freq: Every day | ORAL | Status: DC
  Administered 2012-08-06 – 2012-08-07 (×2): 20 mg via ORAL
  Filled 2012-08-06 (×2): qty 1

## 2012-08-06 MED ORDER — VITAMINS A & D EX OINT
TOPICAL_OINTMENT | CUTANEOUS | Status: AC
  Filled 2012-08-06: qty 5

## 2012-08-06 NOTE — Progress Notes (Signed)
[facility] PROGRESS NOTE  [name] [name] ZHY:[id number] DOB: [birth year] DOA: 07/30/2012 PCP: [name] Linger, MD  Brief narrative 78 y.o. female with history of diabetes, hypothyroidism and hypertension who was sent to the emergency room on 12/19 by her primary care physician with worsening dyspnea. The patient has no prior cardiac history. She had elevated d-dimer but CTA chest was negative for PE. In the ED, patient was found to be tachypneic with sats dropping into the 70s on 10 L nasal cannula. She received Lasix 40 mg IV, morphine 2 mg IV and, initiated BiPAP for respiratory support. Patient was also placed on a nitroglycerin drip by the emergency room physician.   Assessment/Plan:  Nausea and diarrhea  DD:S/E of Dopamine versus Viral GE, Gastroparesis & R/O Cdiff.  Resolved.  C. difficile PCR negative. Of dopamine. Tolerating diet.  Hypotension/shock  Likely secondary to polypharmacy (Lasix, Aldactone, Lisinopril & Carvedilol), over diuresis and cardiomyopathy.  Did not improve on 12/23 after IV fluid bolus and holding culprit medications. Was transferred to step down and started on vasopressors/dopamine drip via central line and critical care team was consulted. Patient had transient abdominal pain and nausea attributed to dopamine and improved after IV fluids and reducing rate of dopamine.  PCCM and cardiology input appreciated.  Of dopamine since 12/24. Blood pressures improved. Transferred back to telemetry on 12/25  Resuming low dose lisinopril and Lasix on 12/26.  Acute renal failure, nonoliguric  Likely secondary to ATN from hypotension, lisinopril and diuresis.  Resolved after IV fluids and vasopressors. Avoid nephrotoxic agents.  No hydronephrosis by ultrasound.  Monitor  Acute respiratory failure  secondary to pulmonary edema- resolved after diuretics, bipap and nitro drip from 12/19 until 12/20 .   Per pulmonology, no convincing pneumonia and Levaquin  discontinued on 12/23.  Resolved  CHF, acute systolic  echocardiogram result indicating severe depressed EF  Patient was on oral furosemide, spironolactone, lisinopril-held until 12/22 PM but had to be held due to hypotension and acute renal failure.   Currently compensated.  Cardiology following in our resuming low dose lisinopril and Lasix.  Cardiomyopathy  EF 20-25% with diffuse hypokinesis.  Cardiology following.  Continue aspirin and Lipitor. Lisinopril, diuretics and beta blockers on hold secondary to hypotension. Plan to resume low dose lisinopril when blood pressures consistently stable and off pressors.  Cardiology indicates that her picture is likely that of nonischemic dilated cardiomyopathy with low likelihood of CAD. Possible DC an outpatient followup with them in 3-4 weeks for Adenosine Myoview. (See Dr. Fabio Bering 12/21 note)  HTN  Controlled.  Hypothyroidism  Synthroid daily  DM   ssi for now. May resume oral meds at DC  RLL pneumonia   started on empiric levaquin on admission.  Discontinued on 12/23  Followup chest x-ray as outpatient in a couple of weeks.    Elevated D dimer   ?Cause , afebrile, will need outpt f/u .   Rheum factor highly elevated, ana pending - may need rheum outpt f/u   Anemia  Slight drop in hemoglobin. Repeat CBC in a.m. No bleeding  Code Status: Full Family Communication: Discussed with patient. Disposition Plan: DC home probably 12/27   Consultants:   Cardiology   pulmonary and critical care medicine.   Procedures:  Bipap  Antibiotics:  Rocephin IV x1 dose on 12/19  Levaquin IV 12/19-12/20  By mouth Levaquin 12/21 > 12/23  HPI/Subjective: Denies complaints.  Objective: Filed Vitals:   08/05/12 1552 08/05/12 2114 08/06/12 0607 08/06/12 0637  BP: 122/59  114/56 125/60 117/57  Pulse: 81 73 72 69  Temp:  98.5 F (36.9 C) 98 F (36.7 C) 98 F (36.7 C)  TempSrc:  Oral  Oral  Resp:  20 18 20 20   Height:      Weight:      SpO2: 96% 98% 98% 97%      Intake/Output Summary (Last 24 hours) at 08/06/12 1021 Last data filed at 08/06/12 0830  Gross per 24 hour  Intake    780 ml  Output    400 ml  Net    380 ml   Filed Weights   08/03/12 0613 08/04/12 0400 08/05/12 0400  Weight: 63.1 kg (139 lb 1.8 oz) 64.5 kg (142 lb 3.2 oz) 64.8 kg (142 lb 13.7 oz)    Exam:   General exam: Looks much improved. Pleasant and comfortable.  Respiratory system: clear to auscultation bilaterally. No increased work of breathing.  Cardiovascular system: First and second heart sounds heard, regular rate and rhythm. No JVD, murmurs or pedal edema. Telemetry shows sinus rhythm with wide QRS.  Gastrointestinal system: Abdomen is nondistended, soft and nontender. Normal bowel sounds heard.  Central nervous system: Alert and oriented. No focal neurological deficits.  Extremities: Symmetric 5 x 5 power.   Data Reviewed: Basic Metabolic Panel:  Lab 08/06/12 1610 08/05/12 0500 08/04/12 1135 08/04/12 0300 08/03/12 0443  NA 134* 136 137 136 132*  K 3.7 3.8 3.9 3.9 3.8  CL 101 106 106 103 94*  CO2 27 25 24 24 22   GLUCOSE 179* 106* 127* 168* 125*  BUN 9 11 14 17  32*  CREATININE 0.80 0.81 0.88 1.02 3.11*  CALCIUM 8.9 9.0 8.8 8.8 8.9  MG -- -- -- -- --  PHOS -- -- -- -- --   Liver Function Tests:  Lab 08/04/12 0300 07/31/12 0530 07/30/12 1144  AST 26 16 19   ALT 19 12 17   ALKPHOS 44 45 46  BILITOT 0.4 0.5 0.8  PROT 6.9 7.4 7.9  ALBUMIN 3.2* 3.5 4.0   No results found for this basename: LIPASE:5,AMYLASE:5 in the last 168 hours No results found for this basename: AMMONIA:5 in the last 168 hours CBC:  Lab 08/06/12 0340 08/04/12 0300 08/03/12 0443 07/31/12 0530 07/30/12 1144  WBC 4.6 5.8 6.5 7.0 5.7  NEUTROABS -- -- -- -- 3.1  HGB 10.3* 11.4* 11.9* 11.6* 12.2  HCT 31.7* 34.2* 36.1 34.3* 37.8  MCV 81.7 80.5 80.2 79.2 83.0  PLT 234 264 250 261 258.0   Cardiac Enzymes:  Lab  07/31/12 0530 07/30/12 1904 07/30/12 1144  CKTOTAL -- -- 159  CKMB -- -- 3.0  CKMBINDEX -- -- --  TROPONINI <0.30 <0.30 0.02   BNP (last 3 results)  Basename 08/04/12 0300 08/03/12 0443 07/30/12 1144  PROBNP 1926.0* 2238.0* 674.0*   CBG:  Lab 08/06/12 0803 08/06/12 0606 08/05/12 2112 08/05/12 1720 08/05/12 1309  GLUCAP 186* 185* 255* 261* 275*    Recent Results (from the past 240 hour(s))  MRSA PCR SCREENING     Status: Normal   Collection Time   07/30/12  6:49 PM      Component Value Range Status Comment   MRSA by PCR NEGATIVE  NEGATIVE Final   CLOSTRIDIUM DIFFICILE BY PCR     Status: Normal   Collection Time   08/04/12  4:15 PM      Component Value Range Status Comment   C difficile by pcr NEGATIVE  NEGATIVE Final     Other lab data  Hemoglobin A1c: 8.3  TSH: 3.64  ANA negative  Rheumatoid factor: Greater than 600.  2-D echo Study Conclusions  - Left ventricle: The cavity size was normal. Wall thickness was increased in a pattern of mild LVH. Systolic function was severely reduced. The estimated ejection fraction was in the range of 20% to 25%. Diffuse hypokinesis. Doppler parameters are consistent with abnormal left ventricular relaxation (grade 1 diastolic dysfunction). - Ventricular septum: Septal motion showed paradox. - Mitral valve: Moderate regurgitation. - Left atrium: The atrium was mildly dilated.   Studies: Dg Chest Port 1 View  08/05/2012  *RADIOLOGY REPORT*  Clinical Data: Evaluate infiltrates and/or edema  PORTABLE CHEST - 1 VIEW  Comparison: Portable chest x-ray of 08/04/2012  Findings:  There is little change in poor aeration with basilar haziness consistent with atelectasis and effusions.  There is cardiomegaly present with moderate pulmonary vascular congestion. Right central venous line tip overlies the lower SVC.  No bony abnormality is seen.  IMPRESSION: No significant change in cardiomegaly and pulmonary vascular congestion with effusions  and basilar atelectasis.   Original Report Authenticated By: Dwyane Dee, M.D.     Scheduled Meds:    . antiseptic oral rinse  15 mL Mouth Rinse BID  . aspirin EC  81 mg Oral Daily  . atorvastatin  10 mg Oral q1800  . enoxaparin (LOVENOX) injection  40 mg Subcutaneous Q24H  . furosemide  20 mg Oral Daily  . insulin aspart  0-5 Units Subcutaneous QHS  . insulin aspart  0-9 Units Subcutaneous TID WC  . levothyroxine  100 mcg Oral QAC breakfast  . lisinopril  2.5 mg Oral Daily   Continuous Infusions:    Principal Problem:  *Acute respiratory failure Active Problems:  HYPOTHYROIDISM  Type II or unspecified type diabetes mellitus with neurological manifestations, uncontrolled(250.62)  DIABETIC PERIPHERAL NEUROPATHY  VITAMIN D DEFICIENCY  HYPERLIPIDEMIA, MIXED  Elevated d-dimer  Pulmonary edema  LBBB (left bundle branch block)  Systolic CHF, acute  Cardiomyopathy  HTN (hypertension)  Acute renal failure  Hypotension  Diarrhea    [facility]  [facility] Pager [phone]. If 8PM-8AM, please contact night-coverage at [email or web address], password [facility] 08/06/2012, 10:21 AM  LOS: 7 days

## 2012-08-06 NOTE — Progress Notes (Signed)
Inpatient Diabetes Program Recommendations  AACE/ADA: New Consensus Statement on Inpatient Glycemic Control (2013)  Target Ranges:  Prepandial:   less than 140 mg/dL      Peak postprandial:   less than 180 mg/dL (1-2 hours)      Critically ill patients:  140 - 180 mg/dL   Results for LOUAN, BASE (MRN [id number]) as of 08/06/2012 11:38  Ref. Range 08/05/2012 07:49 08/05/2012 13:09 08/05/2012 17:20 08/05/2012 21:12  Glucose-Capillary Latest Range: 70-99 mg/dL 644 (H) 034 (H) 742 (H) 255 (H)   Results for MIKAYELA, DEATS (MRN [id number]) as of 08/06/2012 11:38  Ref. Range 08/06/2012 08:03  Glucose-Capillary Latest Range: 70-99 mg/dL 433 (H)   Patient normally takes oral medications at home.  Being managed solely with Novolog at present in hospital.  Likely to resume home oral medications at discharge.  For now, patient having significant elevations in CBGs.  Eating 70-100% of her meals.  CBGs may improve with addition of meal coverage while here in hospital.  Recommend the following:  Please consider adding meal coverage- Novolog 4 units tid with meals.   Note: Will follow. Ambrose Finland RN, MSN, CDE Diabetes Coordinator Inpatient Diabetes Program [phone]

## 2012-08-06 NOTE — Progress Notes (Signed)
TELEMETRY: Reviewed telemetry pt in NSR: Filed Vitals:   08/05/12 1552 08/05/12 2114 08/06/12 0607 08/06/12 0637  BP: 122/59 114/56 125/60 117/57  Pulse: 81 73 72 69  Temp:  98.5 F (36.9 C) 98 F (36.7 C) 98 F (36.7 C)  TempSrc:  Oral  Oral  Resp: 20 18 20 20   Height:      Weight:      SpO2: 96% 98% 98% 97%    Intake/Output Summary (Last 24 hours) at 08/06/12 0714 Last data filed at 08/05/12 2300  Gross per 24 hour  Intake    480 ml  Output    400 ml  Net     80 ml    SUBJECTIVE Patient reports she feels much better. No nausea or diarrhea. Denies SOB or chest pain but did put oxygen on last night for 1 hour.   LABS: Basic Metabolic Panel:  Basename 08/06/12 0340 08/05/12 0500  NA 134* 136  K 3.7 3.8  CL 101 106  CO2 27 25  GLUCOSE 179* 106*  BUN 9 11  CREATININE 0.80 0.81  CALCIUM 8.9 9.0  MG -- --  PHOS -- --   Liver Function Tests:  Basename 08/04/12 0300  AST 26  ALT 19  ALKPHOS 44  BILITOT 0.4  PROT 6.9  ALBUMIN 3.2*   No results found for this basename: LIPASE:2,AMYLASE:2 in the last 72 hours CBC:  Basename 08/06/12 0340 08/04/12 0300  WBC 4.6 5.8  NEUTROABS -- --  HGB 10.3* 11.4*  HCT 31.7* 34.2*  MCV 81.7 80.5  PLT 234 264    Radiology/Studies:  Ct Angio Chest W/cm &/or Wo Cm  07/30/2012  *RADIOLOGY REPORT*  Clinical Data: Shortness of breath.  Elevated D-dimer.  CT ANGIOGRAPHY CHEST  Technique:  Multidetector CT imaging of the chest using the standard protocol during bolus administration of intravenous contrast. Multiplanar reconstructed images including MIPs were obtained and reviewed to evaluate the vascular anatomy.  Contrast: 80mL OMNIPAQUE IOHEXOL 350 MG/ML SOLN  Comparison: None.  Findings: Satisfactory opacification of the pulmonary arteries noted, and there is no evidence of pulmonary emboli.  No evidence of thoracic aortic aneurysm.  Poor contrast opacification of thoracic aorta seen, but no definite dissection visualized.  No  evidence of mediastinal hematoma or mass.  Moderate right and small left pleural effusions are seen.  Diffuse interstitial infiltrates are seen, consistent with interstitial edema. There is also streaky opacity with air bronchograms in the right middle lobe, and superimposed pneumonia cannot be excluded.  No central endobronchial obstruction identified.  No evidence of hilar or mediastinal lymphadenopathy. Borderline cardiomegaly noted.  IMPRESSION:  1.  No evidence of pulmonary embolism. 2.  Diffuse interstitial infiltrates or edema and bilateral pleural effusions, highly suspicious for congestive heart failure. 3.  Right middle lobe infiltrate versus atelectasis. Superimposed pneumonia cannot be excluded. 4.  No mass or lymphadenopathy identified.   Original Report Authenticated By: Myles Rosenthal, M.D.    Portable Chest 1 View  07/31/2012  *RADIOLOGY REPORT*  Clinical Data: Pulmonary edema.  PORTABLE CHEST - 1 VIEW  Comparison: Chest CT 07/30/2012  Findings: Single view of the chest again demonstrates bibasilar densities.  Findings are suggestive for a combination of pleural fluid and volume loss.  Concern for airspace disease in the right lower lung.  Heart size is within normal limits.  Negative for a pneumothorax.  IMPRESSION: Bibasilar densities.  Findings suggest a combination of pleural fluid and volume loss.  There is also concern for  airspace disease in the right lower lung.   Original Report Authenticated By: Richarda Overlie, M.D.     PORTABLE CHEST - 1 VIEW  Comparison: 08/03/2012.  Findings: Right central line tip caval atrial junction level. No gross pneumothorax.  Heart slightly enlarged.  Asymmetric air space disease suggestive of pulmonary edema with small right-sided pleural effusion. Basilar subsegmental atelectasis and left mid lung zone subsegmental atelectasis.  Basilar consolidation has progressed which may represent combination of pulmonary edema and atelectasis.  Underlying infiltrate not entirely excluded.  IMPRESSION: Slight decreased aeration lung bases. Remainder of findings without significant change.   Original Report Authenticated By: Lacy Duverney, M.D. US Renal Port [[id number]] Resulted:08/03/12 1637 Order Status:Completed Updated:08/03/12 1637 Narrative: *RADIOLOGY REPORT*  Clinical Data: Acute renal failure  RENAL/URINARY TRACT ULTRASOUND COMPLETE  Comparison: None.  Findings:  Right Kidney: 11.2 cm. No hydronephrosis. Well-preserved cortex. Normal size and parenchymal echotexture without focal abnormalities.  Left Kidney: 11.2 cm. No hydronephrosis. Well-preserved cortex. Normal size and parenchymal echotexture without focal abnormalities.  Bladder: Physiologically distended.  Incidental note made of a right pleural effusion.  IMPRESSION:  1. No hydronephrosis. 2. Right pleural effusion.   Original Report Authenticated By: D. Andria Rhein, MD  DG CHEST PORT 1 VIEW [[id number]] Resulted:08/05/12 5621 Order Status:Completed Updated:08/05/12 [phone] Narrative: *RADIOLOGY REPORT*  Clinical Data: Evaluate infiltrates and/or edema  PORTABLE CHEST - 1 VIEW  Comparison: Portable chest x-ray of 08/04/2012  Findings: There is little change in poor aeration with basilar haziness consistent with atelectasis and effusions. There is cardiomegaly present with moderate pulmonary vascular congestion. Right central venous line tip overlies the lower SVC. No bony abnormality is seen.  IMPRESSION: No significant change in cardiomegaly and pulmonary vascular congestion with effusions and basilar atelectasis.   Original Report Authenticated By: Dwyane Dee, M.D.   ECHO: Transthoracic Echocardiography  Patient: [name], [name] MR #: [id number] Study Date: 07/31/2012 Gender: F Age: 78 Height: 165.1cm Weight: 64.9kg BSA: 1.49m^2 Pt. Status: Room: 1229  PERFORMING [place], [facility] ADMITTING [place], [place] ATTENDING [place],  Sorin ORDERING [place], Sorin REFERRING [place], [place]in SONOGRAPHER Rodri­guez [place], [place] cc:  ------------------------------------------------------------ LV EF: 20% - 25%  ------------------------------------------------------------ History: PMH: LBBB. Pulmonary edema. Risk factors: Hypothyroidism. Vitamin D deficiency. Acute respiratory failure. Osteoarthritis. Diabetes mellitus. Dyslipidemia.  ------------------------------------------------------------ Study Conclusions  - Left ventricle: The cavity size was normal. Wall thickness was increased in a pattern of mild LVH. Systolic function was severely reduced. The estimated ejection fraction was in the range of 20% to 25%. Diffuse hypokinesis. Doppler parameters are consistent with abnormal left ventricular relaxation (grade 1 diastolic dysfunction). - Ventricular septum: Septal motion showed paradox. - Mitral valve: Moderate regurgitation. - Left atrium: The atrium was mildly dilated. Transthoracic echocardiography. M-mode, complete 2D, spectral Doppler, and color Doppler. Height: Height: 165.1cm. Height: 65in. Weight: Weight: 64.9kg. Weight: 142.7lb. Body mass index: BMI: 23.8kg/m^2. Body surface area: BSA: 1.103m^2. Blood pressure: 123/66. Patient status: Inpatient. Location: ICU/CCU  ------------------------------------------------------------  ------------------------------------------------------------ Left ventricle: The cavity size was normal. Wall thickness was increased in a pattern of mild LVH. Systolic function was severely reduced. The estimated ejection fraction was in the range of 20% to 25%. Diffuse hypokinesis. Doppler parameters are consistent with abnormal left ventricular relaxation (grade 1 diastolic dysfunction).  ------------------------------------------------------------ Aortic valve: Trileaflet; mildly thickened, mildly calcified  leaflets.  ------------------------------------------------------------ Aorta: Aortic root: The aortic root was normal in size. Ascending aorta: The ascending aorta was normal in size.  ------------------------------------------------------------ Mitral valve: Mildly thickened leaflets . Doppler: Moderate regurgitation. Peak gradient: 4mm Hg (  D).  ------------------------------------------------------------ Left atrium: The atrium was mildly dilated.  ------------------------------------------------------------ Right ventricle: The cavity size was normal. Wall thickness was normal. Systolic function was normal.  ------------------------------------------------------------ Ventricular septum: Septal motion showed paradox.  ------------------------------------------------------------ Pulmonic valve: The valve appears to be grossly normal.  ------------------------------------------------------------ Tricuspid valve: Mildly thickened leaflets. Doppler: Trivial regurgitation.  ------------------------------------------------------------ Right atrium: The atrium was at the upper limits of normal in size.  ------------------------------------------------------------ Pericardium: The pericardium was normal in appearance.  ------------------------------------------------------------  2D measurements Normal Doppler Normal Left ventricle measurements LVID ED, 41 mm 43-52 Left ventricle chord, Ea, lat 7.31 cm/ ------- PLAX ann, tiss s LVID ES, 40 mm 23-38 DP chord, E/Ea, lat 12.9 ------- PLAX ann, tiss FS, chord, 2 % >29 DP PLAX Ea, med 5.36 cm/ ------- LVPW, ED 11 mm ------ ann, tiss s IVS/LVPW 1.09 <1.3 DP ratio, ED E/Ea, med 17.59 ------- Ventricular septum ann, tiss IVS, ED 12 mm ------ DP Aorta Mitral valve Root diam, 33 mm ------ Peak E vel 94.3 cm/ ------- ED s Left atrium Peak A vel 101 cm/ ------- AP dim 35 mm ------ s AP dim 2.03 cm/m^2 <2.2 Deceleratio 165 ms  150-230 index n time Vol, S 99 ml ------ Peak 4 mm ------- Vol index, 57.6 ml/m^2 ------ gradient, D Hg S Peak E/A 0.9 ------- ratio Systemic veins Estimated 20 mm ------- CVP Hg Right ventricle Sa vel, lat 8.09 cm/ ------- ann, tiss s DP  ------------------------------------------------------------ Prepared and Electronically Authenticated by  Cassell Clement    PHYSICAL EXAM General: Well developed, elderly, in no acute distress. Head: Normocephalic, atraumatic, sclera non-icteric, no xanthomas, nares are without discharge. Neck: Negative for carotid bruits. No JVD. Central line in right IJ. Lungs: Clear bilaterally to auscultation without wheezes, rales, or rhonchi. Decreased BS left base. Heart: RRR S1 S2 without murmurs, rubs, or gallops.  Abdomen: Soft, non-tender, non-distended with normoactive bowel sounds. No hepatomegaly. No rebound/guarding. No obvious abdominal masses. Msk:  Strength and tone appears normal for age. Extremities: No clubbing, cyanosis or edema.  Distal pedal pulses are 2+ and equal bilaterally. Neuro: Alert and oriented X 3. Moves all extremities spontaneously. Psych:  Responds to questions appropriately with a normal affect.  ASSESSMENT AND PLAN: 1. Acute systolic CHF. EF 20-25%. BP stabilized off IV dopamine. Will resume low dose ACEi and lasix today. Ambulate in hall. Central line can be DC. If stable anticipate DC in am with close follow up in heart failure clinic.  2. Acute renal failure secondary to hypotension and meds. Creatinine recovered 1.13>>3.11>>1.02>>.81>>.80. Renal ultrasound normal. Urine output good.   3. RLL pneumonia.  4. Diabetes mellitus.  Principal Problem:  *Acute respiratory failure Active Problems:  HYPOTHYROIDISM  Type II or unspecified type diabetes mellitus with neurological manifestations, uncontrolled(250.62)  DIABETIC PERIPHERAL NEUROPATHY  VITAMIN D DEFICIENCY  HYPERLIPIDEMIA, MIXED  Elevated d-dimer   Pulmonary edema  LBBB (left bundle branch block)  Systolic CHF, acute  Cardiomyopathy  HTN (hypertension)  Acute renal failure  Hypotension  Diarrhea    Signed, Rylan Kaufmann [place] MD,FACC 08/06/2012 7:14 AM

## 2012-08-07 DIAGNOSIS — R579 Shock, unspecified: Secondary | ICD-10-CM

## 2012-08-07 DIAGNOSIS — I428 Other cardiomyopathies: Secondary | ICD-10-CM | POA: Diagnosis not present

## 2012-08-07 DIAGNOSIS — J9600 Acute respiratory failure, unspecified whether with hypoxia or hypercapnia: Secondary | ICD-10-CM | POA: Diagnosis not present

## 2012-08-07 DIAGNOSIS — N179 Acute kidney failure, unspecified: Secondary | ICD-10-CM | POA: Diagnosis not present

## 2012-08-07 DIAGNOSIS — I509 Heart failure, unspecified: Secondary | ICD-10-CM | POA: Diagnosis not present

## 2012-08-07 DIAGNOSIS — I447 Left bundle-branch block, unspecified: Secondary | ICD-10-CM | POA: Diagnosis not present

## 2012-08-07 DIAGNOSIS — I5021 Acute systolic (congestive) heart failure: Secondary | ICD-10-CM | POA: Diagnosis not present

## 2012-08-07 LAB — BASIC METABOLIC PANEL
BUN: 8 mg/dL (ref 6–23)
CO2: 27 mEq/L (ref 19–32)
Calcium: 9 mg/dL (ref 8.4–10.5)
Chloride: 102 mEq/L (ref 96–112)
Creatinine, Ser: 0.77 mg/dL (ref 0.50–1.10)
Glucose, Bld: 182 mg/dL — ABNORMAL HIGH (ref 70–99)

## 2012-08-07 LAB — CBC
HCT: 32.4 % — ABNORMAL LOW (ref 36.0–46.0)
MCH: 26.5 pg (ref 26.0–34.0)
MCV: 79.6 fL (ref 78.0–100.0)
Platelets: 254 10*3/uL (ref 150–400)
RDW: 15.3 % (ref 11.5–15.5)

## 2012-08-07 LAB — GLUCOSE, CAPILLARY: Glucose-Capillary: 197 mg/dL — ABNORMAL HIGH (ref 70–99)

## 2012-08-07 MED ORDER — ASPIRIN 81 MG PO TBEC: 81.0000 mg | DELAYED_RELEASE_TABLET | Freq: Every day | ORAL | Status: DC

## 2012-08-07 MED ORDER — FUROSEMIDE 20 MG PO TABS: 20.0000 mg | ORAL_TABLET | Freq: Every day | ORAL | Status: DC

## 2012-08-07 MED ORDER — LISINOPRIL 2.5 MG PO TABS: 2.5000 mg | ORAL_TABLET | Freq: Every day | ORAL | Status: DC

## 2012-08-07 NOTE — Progress Notes (Signed)
TELEMETRY: Reviewed telemetry pt in NSR: Filed Vitals:   08/06/12 1235 08/06/12 1300 08/06/12 2129 08/07/12 0539  BP:  116/62 103/56 122/62  Pulse:  70 66 72  Temp:  98.4 F (36.9 C) 98.8 F (37.1 C) 98.5 F (36.9 C)  TempSrc:  Oral Oral Oral  Resp:  16 18 18   Height:      Weight: 141 lb 1.6 oz (64.003 kg)   141 lb 5 oz (64.1 kg)  SpO2:  100% 99% 97%    Intake/Output Summary (Last 24 hours) at 08/07/12 0707 Last data filed at 08/07/12 0540  Gross per 24 hour  Intake    600 ml  Output   2450 ml  Net  -1850 ml    SUBJECTIVE Patient reports she feels much better. Ambulating without SOB or dizzyness.  LABS: Basic Metabolic Panel:  Basename 08/07/12 0449 08/06/12 0340  NA 137 134*  K 3.6 3.7  CL 102 101  CO2 27 27  GLUCOSE 182* 179*  BUN 8 9  CREATININE 0.77 0.80  CALCIUM 9.0 8.9  MG -- --  PHOS -- --   Liver Function Tests: No results found for this basename: AST:2,ALT:2,ALKPHOS:2,BILITOT:2,PROT:2,ALBUMIN:2 in the last 72 hours No results found for this basename: LIPASE:2,AMYLASE:2 in the last 72 hours CBC:  Basename 08/07/12 0449 08/06/12 0340  WBC 4.9 4.6  NEUTROABS -- --  HGB 10.8* 10.3*  HCT 32.4* 31.7*  MCV 79.6 81.7  PLT 254 234    Radiology/Studies:  Ct Angio Chest W/cm &/or Wo Cm  07/30/2012  *RADIOLOGY REPORT*  Clinical Data: Shortness of breath.  Elevated D-dimer.  CT ANGIOGRAPHY CHEST  Technique:  Multidetector CT imaging of the chest using the standard protocol during bolus administration of intravenous contrast. Multiplanar reconstructed images including MIPs were obtained and reviewed to evaluate the vascular anatomy.  Contrast: 80mL OMNIPAQUE IOHEXOL 350 MG/ML SOLN  Comparison: None.  Findings: Satisfactory opacification of the pulmonary arteries noted, and there is no evidence of pulmonary emboli.  No evidence of thoracic aortic aneurysm.  Poor contrast opacification of thoracic aorta seen, but no definite dissection visualized.  No evidence  of mediastinal hematoma or mass.  Moderate right and small left pleural effusions are seen.  Diffuse interstitial infiltrates are seen, consistent with interstitial edema. There is also streaky opacity with air bronchograms in the right middle lobe, and superimposed pneumonia cannot be excluded.  No central endobronchial obstruction identified.  No evidence of hilar or mediastinal lymphadenopathy. Borderline cardiomegaly noted.  IMPRESSION:  1.  No evidence of pulmonary embolism. 2.  Diffuse interstitial infiltrates or edema and bilateral pleural effusions, highly suspicious for congestive heart failure. 3.  Right middle lobe infiltrate versus atelectasis. Superimposed pneumonia cannot be excluded. 4.  No mass or lymphadenopathy identified.   Original Report Authenticated By: Myles Rosenthal, M.D.    Portable Chest 1 View  07/31/2012  *RADIOLOGY REPORT*  Clinical Data: Pulmonary edema.  PORTABLE CHEST - 1 VIEW  Comparison: Chest CT 07/30/2012  Findings: Single view of the chest again demonstrates bibasilar densities.  Findings are suggestive for a combination of pleural fluid and volume loss.  Concern for airspace disease in the right lower lung.  Heart size is within normal limits.  Negative for a pneumothorax.  IMPRESSION: Bibasilar densities.  Findings suggest a combination of pleural fluid and volume loss.  There is also concern for airspace disease in the right lower lung.   Original Report Authenticated By: Richarda Overlie, M.D.     PORTABLE  CHEST - 1 VIEW  Comparison: 08/03/2012.  Findings: Right central line tip caval atrial junction level. No gross pneumothorax.  Heart slightly enlarged.  Asymmetric air space disease suggestive of pulmonary edema with small right-sided pleural effusion. Basilar subsegmental atelectasis and left mid lung zone subsegmental atelectasis.  Basilar consolidation has progressed which may represent combination of pulmonary edema and atelectasis. Underlying infiltrate not  entirely excluded.  IMPRESSION: Slight decreased aeration lung bases. Remainder of findings without significant change.   Original Report Authenticated By: Lacy Duverney, M.D. US Renal Port [[id number]] Resulted:08/03/12 1637 Order Status:Completed Updated:08/03/12 1637 Narrative: *RADIOLOGY REPORT*  Clinical Data: Acute renal failure  RENAL/URINARY TRACT ULTRASOUND COMPLETE  Comparison: None.  Findings:  Right Kidney: 11.2 cm. No hydronephrosis. Well-preserved cortex. Normal size and parenchymal echotexture without focal abnormalities.  Left Kidney: 11.2 cm. No hydronephrosis. Well-preserved cortex. Normal size and parenchymal echotexture without focal abnormalities.  Bladder: Physiologically distended.  Incidental note made of a right pleural effusion.  IMPRESSION:  1. No hydronephrosis. 2. Right pleural effusion.   Original Report Authenticated By: D. Andria Rhein, MD  DG CHEST PORT 1 VIEW [[id number]] Resulted:08/05/12 1478 Order Status:Completed Updated:08/05/12 [id number] Narrative: *RADIOLOGY REPORT*  Clinical Data: Evaluate infiltrates and/or edema  PORTABLE CHEST - 1 VIEW  Comparison: Portable chest x-ray of 08/04/2012  Findings: There is little change in poor aeration with basilar haziness consistent with atelectasis and effusions. There is cardiomegaly present with moderate pulmonary vascular congestion. Right central venous line tip overlies the lower SVC. No bony abnormality is seen.  IMPRESSION: No significant change in cardiomegaly and pulmonary vascular congestion with effusions and basilar atelectasis.   Original Report Authenticated By: Dwyane Dee, M.D.   ECHO: Transthoracic Echocardiography  Patient: Alithia, Zavaleta MR #: [id number] Study Date: 07/31/2012 Gender: F Age: 78 Height: 165.1cm Weight: 64.9kg BSA: 1.68m^2 Pt. Status: Room: 1229  PERFORMING [place], [facility] ADMITTING [place], Sorin ATTENDING [place], Sorin ORDERING [place],  Sorin REFERRING [place], Sorin SONOGRAPHER [place], [place] cc:  ------------------------------------------------------------ LV EF: 20% - 25%  ------------------------------------------------------------ History: PMH: LBBB. Pulmonary edema. Risk factors: Hypothyroidism. Vitamin D deficiency. Acute respiratory failure. Osteoarthritis. Diabetes mellitus. Dyslipidemia.  ------------------------------------------------------------ Study Conclusions  - Left ventricle: The cavity size was normal. Wall thickness was increased in a pattern of mild LVH. Systolic function was severely reduced. The estimated ejection fraction was in the range of 20% to 25%. Diffuse hypokinesis. Doppler parameters are consistent with abnormal left ventricular relaxation (grade 1 diastolic dysfunction). - Ventricular septum: Septal motion showed paradox. - Mitral valve: Moderate regurgitation. - Left atrium: The atrium was mildly dilated. Transthoracic echocardiography. M-mode, complete 2D, spectral Doppler, and color Doppler. Height: Height: 165.1cm. Height: 65in. Weight: Weight: 64.9kg. Weight: 142.7lb. Body mass index: BMI: 23.8kg/m^2. Body surface area: BSA: 1.30m^2. Blood pressure: 123/66. Patient status: Inpatient. Location: ICU/CCU  ------------------------------------------------------------  ------------------------------------------------------------ Left ventricle: The cavity size was normal. Wall thickness was increased in a pattern of mild LVH. Systolic function was severely reduced. The estimated ejection fraction was in the range of 20% to 25%. Diffuse hypokinesis. Doppler parameters are consistent with abnormal left ventricular relaxation (grade 1 diastolic dysfunction).  ------------------------------------------------------------ Aortic valve: Trileaflet; mildly thickened, mildly calcified leaflets.  ------------------------------------------------------------ Aorta: Aortic root:  The aortic root was normal in size. Ascending aorta: The ascending aorta was normal in size.  ------------------------------------------------------------ Mitral valve: Mildly thickened leaflets . Doppler: Moderate regurgitation. Peak gradient: 4mm Hg (D).  ------------------------------------------------------------ Left atrium: The atrium was mildly dilated.  ------------------------------------------------------------ Right ventricle: The cavity size was normal. Wall thickness  was normal. Systolic function was normal.  ------------------------------------------------------------ Ventricular septum: Septal motion showed paradox.  ------------------------------------------------------------ Pulmonic valve: The valve appears to be grossly normal.  ------------------------------------------------------------ Tricuspid valve: Mildly thickened leaflets. Doppler: Trivial regurgitation.  ------------------------------------------------------------ Right atrium: The atrium was at the upper limits of normal in size.  ------------------------------------------------------------ Pericardium: The pericardium was normal in appearance.  ------------------------------------------------------------  2D measurements Normal Doppler Normal Left ventricle measurements LVID ED, 41 mm 43-52 Left ventricle chord, Ea, lat 7.31 cm/ ------- PLAX ann, tiss s LVID ES, 40 mm 23-38 DP chord, E/Ea, lat 12.9 ------- PLAX ann, tiss FS, chord, 2 % >29 DP PLAX Ea, med 5.36 cm/ ------- LVPW, ED 11 mm ------ ann, tiss s IVS/LVPW 1.09 <1.3 DP ratio, ED E/Ea, med 17.59 ------- Ventricular septum ann, tiss IVS, ED 12 mm ------ DP Aorta Mitral valve Root diam, 33 mm ------ Peak E vel 94.3 cm/ ------- ED s Left atrium Peak A vel 101 cm/ ------- AP dim 35 mm ------ s AP dim 2.03 cm/m^2 <2.2 Deceleratio 165 ms 150-230 index n time Vol, S 99 ml ------ Peak 4 mm ------- Vol index, 57.6 ml/m^2 ------  gradient, D Hg S Peak E/A 0.9 ------- ratio Systemic veins Estimated 20 mm ------- CVP Hg Right ventricle Sa vel, lat 8.09 cm/ ------- ann, tiss s DP  ------------------------------------------------------------ Prepared and Electronically Authenticated by  Cassell Clement    PHYSICAL EXAM General: Well developed, elderly, in no acute distress. Head: Normocephalic, atraumatic, sclera non-icteric, no xanthomas, nares are without discharge. Neck: Negative for carotid bruits. No JVD.  Lungs: Clear bilaterally to auscultation without wheezes, rales, or rhonchi.  Heart: RRR S1 S2 without murmurs, rubs, or gallops.  Abdomen: Soft, non-tender, non-distended with normoactive bowel sounds. No hepatomegaly. No rebound/guarding. No obvious abdominal masses. Msk:  Strength and tone appears normal for age. Extremities: No clubbing, cyanosis or edema.  Distal pedal pulses are 2+ and equal bilaterally. Neuro: Alert and oriented X 3. Moves all extremities spontaneously. Psych:  Responds to questions appropriately with a normal affect.  ASSESSMENT AND PLAN: 1. Acute systolic CHF. EF 20-25%. BP stabilized off IV dopamine. On low dose ACEi and lasix. OK to discharge today from our standpoint. Will arrange follow up in heart failure clinic next week. Will also arrange Lexiscan myoview study in [facility] Office.   2. Acute renal failure secondary to hypotension and meds.Resolved.  3. RLL pneumonia.  4. Diabetes mellitus.  Principal Problem:  *Acute respiratory failure Active Problems:  HYPOTHYROIDISM  Type II or unspecified type diabetes mellitus with neurological manifestations, uncontrolled(250.62)  DIABETIC PERIPHERAL NEUROPATHY  VITAMIN D DEFICIENCY  HYPERLIPIDEMIA, MIXED  Elevated d-dimer  Pulmonary edema  LBBB (left bundle branch block)  Systolic CHF, acute  Cardiomyopathy  HTN (hypertension)  Acute renal failure  Hypotension  Diarrhea    Signed, Lynnie Koehler [place]  MD,FACC 08/07/2012 7:07 AM

## 2012-08-07 NOTE — Progress Notes (Signed)
Nutrition Brief Note  Patient identified on the Malnutrition Screening Tool (MST) Report  Body mass index is 23.52 kg/(m^2). Pt meets criteria for normal weight based on current BMI.   Current diet order is heart healthy, patient is consuming approximately 50-100% of meals at this time. Labs and medications reviewed.   No nutrition interventions warranted at this time. If nutrition issues arise, please consult RD.   Melody Haver, RD, LDN Pager: [phone] After hours Pager: [phone]

## 2012-08-07 NOTE — Discharge Summary (Signed)
Physician Discharge Summary  [name] [name] AVW:[id number] DOB: [birth year] DOA: 07/30/2012  PCP: Sanda Linger, MD  Admit date: 07/30/2012 Discharge date: 08/07/2012  Time spent: Greater than 60 minutes  Recommendations for Outpatient Follow-up:  1. With Dr. Arvilla Meres, Cardiologist/advanced heart failure clinic on 08/13/2012 at 1:30 PM. 2. With [facility] Cardiology office on 08/17/2012 at 8:45 PM for stress test. 3. With Dr. Charlton Haws, Cardiology on 08/25/2012 at 2:15 PM. 4. With Dr. Sanda Linger, PCP in 2 weeks from hospital discharge with labs (BMP). 5. Followup of chest x-ray in a couple of weeks to ensure resolution of possible pneumonia findings.  Discharge Diagnoses:  Principal Problem:  *Acute respiratory failure Active Problems:  HYPOTHYROIDISM  Type II or unspecified type diabetes mellitus with neurological manifestations, uncontrolled(250.62)  DIABETIC PERIPHERAL NEUROPATHY  VITAMIN D DEFICIENCY  HYPERLIPIDEMIA, MIXED  Elevated d-dimer  Pulmonary edema  LBBB (left bundle branch block)  Systolic CHF, acute  Cardiomyopathy  HTN (hypertension)  Acute renal failure  Hypotension  Diarrhea   Discharge Condition: Improved and stable.  Diet recommendation: Heart healthy and diabetic diet.  Filed Weights   08/05/12 0400 08/06/12 1235 08/07/12 0539  Weight: 64.8 kg (142 lb 13.7 oz) 64.003 kg (141 lb 1.6 oz) 64.1 kg (141 lb 5 oz)    History of present illness:  78 y.o. female with history of diabetes, hypothyroidism and hypertension who was sent to the emergency room on 12/19 by her primary care physician with worsening dyspnea. The patient has no prior cardiac history. She had elevated d-dimer but CTA chest was negative for PE. In the ED, patient was found to be in acute respiratory failure, likely from decompensated CHF. She received Lasix 40 mg IV, morphine 2 mg IV and, initiated BiPAP for respiratory support. Patient was also placed on a nitroglycerin drip by the  emergency room physician. She was admitted for further evaluation and management.   Hospital Course:   Acute respiratory failure  Likely secondary to acute systolic congestive heart failure. She was treated with diuretics, BiPAP and nitro drip. She was briefly on Levaquin for presumed pneumonia. Respiratory failure has resolved.  CHF, acute systolic  [place] [facility]ology was consulted. Her 2-D echocardiogram shows severely depressed EF of 20-25% with diffuse hypokinesis. She was treated in the usual fashion with diuretics, lisinopril and low-dose beta blockers. However on 12/22 patient became symptomatically hypotensive and went into acute renal failure. All her cardiac medications were held. She was retransferred to step down unit. She received a central line and was placed on pressors/dopamine infusion. Once she stabilized, she was transferred back to the medical floor. She was reinitiated on low dose lisinopril and Lasix which she has tolerated. Cardiology has seen her and cleared her for discharge home and arrange for outpatient followup with cardiology and for stress test. CHF is compensated. Cardiology will consider adding low dose beta blockers as outpatient as blood pressure tolerates.  Cardiomyopathy  This is new. LVEF 20-25% with diffuse hypokinesis. Cardiology is consulted. Patient is on aspirin and statins. Please see management as above. Cardiology indicated that her picture it is likely that of nonischemic dilated cardiomyopathy with low likelihood of CAD. She has a appointment for outpatient stress test.  Shock  Likely multifactorial from over diuresis, polypharmacy (lisinopril, carvedilol, Lasix and Aldactone) and hypovolemia. Patient was retransferred to step down unit and was briefly on pressors/dopamine via central line. Critical care medicine consulted. She received a couple of boluses of IV fluids. She transiently had nausea, vomiting and  abdominal pain likely related to the  dopamine. This has resolved.  Acute renal failure  Secondary to ATN from shock. Resolved. No hydronephrosis on renal ultrasound. Follow BMP closely as outpatient while she's on diuretics and lisinopril.  Nausea, vomiting and diarrhea  Possibly from dopamine versus gastroparesis. Resolved. Tolerating diet. C. difficile PCR was negative.  Hypertension  Controlled. Continue low dose lisinopril.  Type 2 diabetes mellitus  Oral hypoglycemics were held in hospital and patient was placed on sliding scale insulin. She will resume her home medications on discharge.  Hypothyroidism  Continue Synthroid. Clinically euthyroid.  Presumed right lower lobe pneumonia versus all finding secondary to CHF  Briefly patient was on Levaquin. Recommend followup chest x-ray in a couple of weeks to insure resolution.  Elevated d-dimer  Unclear etiology. Patient afebrile. Rheumatoid factor highly elevated. ANA negative. Patient may need outpatient rheumatology consultation.  Anemia  Stable.    Consultants:  [facility]  [facility].   Procedures:  Bipap  Antibiotics:  Rocephin IV x1 dose on 12/19  Levaquin IV 12/19-12/20  By mouth Levaquin 12/21 > 12/23   Discharge Exam:  Complaints Denies any complaints. No dyspnea, chest pain, dizziness, lightheadedness, nausea, vomiting or diarrhea. Tolerating diet. Ambulating steadily.  Filed Vitals:   08/06/12 1235 08/06/12 1300 08/06/12 2129 08/07/12 0539  BP:  116/62 103/56 122/62  Pulse:  70 66 72  Temp:  98.4 F (36.9 C) 98.8 F (37.1 C) 98.5 F (36.9 C)  TempSrc:  Oral Oral Oral  Resp:  16 18 18   Height:      Weight: 64.003 kg (141 lb 1.6 oz)   64.1 kg (141 lb 5 oz)  SpO2:  100% 99% 97%    General exam: Pleasant and comfortable.  Respiratory system: clear to auscultation bilaterally. No increased work of breathing.  Cardiovascular system: First and second heart sounds heard, regular rate  and rhythm. No JVD, murmurs or pedal edema. Telemetry shows sinus rhythm with wide QRS.  Gastrointestinal system: Abdomen is nondistended, soft and nontender. Normal bowel sounds heard.  Central nervous system: Alert and oriented. No focal neurological deficits.  Extremities: Symmetric 5 x 5 power.   Discharge Instructions      Discharge Orders    Future Appointments: Provider: Department: Dept Phone: Center:   08/13/2012 9:15 AM [name], MD [facility] [phone] [facility]   08/13/2012 1:30 PM [facility] [phone] None   08/17/2012 8:45 AM [facility] SITE 3 NUCLEAR MED [phone] None   08/25/2012 2:15 PM Wendall Stade, MD [facility] Main [facility] [place]) [phone] [facility]     Future Orders Please Complete By Expires   Diet - low sodium heart healthy      Diet Carb Modified      Increase activity slowly      Call MD for:  difficulty breathing, headache or visual disturbances      (HEART FAILURE PATIENTS) Call MD:  Anytime you have any of the following symptoms: 1) 3 pound weight gain in 24 hours or 5 pounds in 1 week 2) shortness of breath, with or without a dry hacking cough 3) swelling in the hands, feet or stomach 4) if you have to sleep on extra pillows at night in order to breathe.          Medication List     As of 08/07/2012  1:21 PM  TAKE these medications         accu-chek multiclix lancets   1 each by Other route daily. Use as instructed      aspirin 81 MG EC tablet   Take 1 tablet (81 mg total) by mouth daily.      ergocalciferol 50000 UNITS capsule   Commonly known as: VITAMIN D2   Take 50,000 Units by mouth once a week.      furosemide 20 MG tablet   Commonly known as: LASIX   Take 1 tablet (20 mg total) by mouth daily.      glimepiride 4 MG tablet   Commonly known as: AMARYL   Take 4 mg by mouth daily  before breakfast.      glucose blood test strip   1 each by Other route as needed. Test twice daily.    Please dispense a 3 month supply      levothyroxine 100 MCG tablet   Commonly known as: SYNTHROID, LEVOTHROID   Take 100 mcg by mouth daily. BRAND MEDICALLY NECESSARY.      lisinopril 2.5 MG tablet   Commonly known as: PRINIVIL,ZESTRIL   Take 1 tablet (2.5 mg total) by mouth daily.      Pitavastatin Calcium 4 MG Tabs   Take 1 tablet by mouth daily. For cholesterol.      sitaGLIPtan-metformin 50-1000 MG per tablet   Commonly known as: JANUMET   Take 1 tablet by mouth 2 (two) times daily with a meal.         Follow-up Information    Follow up with Arvilla Meres, MD. On 08/13/2012. (1:30)    Contact information:   [facility] [address] [place] [address] [phone] Garage Code: [phone]       Follow up with [facility] [address] On 08/17/2012. (Stress Test @ 8:45 (Please arrive at least early))    Contact information:   [facility] [address] [place] [address] [phone]       Follow up with Charlton Haws, MD. On 08/25/2012. (2:15)    Contact information:   [facility] [address] [place] [address] [phone]       Follow up with Sanda Linger, MD. Schedule an appointment as soon as possible for a visit in 2 weeks. (To be seen with blood tests (BMP))    Contact information:   [address] [place] [address] [phone]           The results of significant diagnostics from this hospitalization (including imaging, microbiology, ancillary and laboratory) are listed below for reference.    Significant Diagnostic Studies: Ct Angio Chest W/cm &/or Wo Cm  07/30/2012  *RADIOLOGY REPORT*  Clinical Data: Shortness of breath.  Elevated D-dimer.  CT ANGIOGRAPHY CHEST  Technique:  Multidetector CT imaging of the chest using  the standard protocol during bolus administration of intravenous contrast. Multiplanar reconstructed images including MIPs were obtained and reviewed to evaluate the vascular anatomy.  Contrast: 80mL OMNIPAQUE IOHEXOL 350 MG/ML SOLN  Comparison: None.  Findings: Satisfactory opacification of the pulmonary arteries noted, and there is no evidence of pulmonary emboli.  No evidence of thoracic aortic aneurysm.  Poor contrast opacification of thoracic aorta seen, but no definite dissection visualized.  No evidence of mediastinal hematoma or mass.  Moderate right and small left pleural effusions are seen.  Diffuse interstitial infiltrates are seen,  consistent with interstitial edema. There is also streaky opacity with air bronchograms in the right middle lobe, and superimposed pneumonia cannot be excluded.  No central endobronchial obstruction identified.  No evidence of hilar or mediastinal lymphadenopathy. Borderline cardiomegaly noted.  IMPRESSION:  1.  No evidence of pulmonary embolism. 2.  Diffuse interstitial infiltrates or edema and bilateral pleural effusions, highly suspicious for congestive heart failure. 3.  Right middle lobe infiltrate versus atelectasis. Superimposed pneumonia cannot be excluded. 4.  No mass or lymphadenopathy identified.   Original Report Authenticated By: Myles Rosenthal, M.D.    US Renal Port  08/03/2012  *RADIOLOGY REPORT*  Clinical Data: Acute renal failure  RENAL/URINARY TRACT ULTRASOUND COMPLETE  Comparison:  None.  Findings:  Right Kidney:  11.2 cm. No hydronephrosis.  Well-preserved cortex. Normal size and parenchymal echotexture without focal abnormalities.  Left Kidney:  11.2 cm. No hydronephrosis.  Well-preserved cortex. Normal size and parenchymal echotexture without focal abnormalities.  Bladder:  Physiologically distended.  Incidental note made of a right pleural effusion.  IMPRESSION:  1.  No hydronephrosis. 2.  Right pleural effusion.   Original Report Authenticated By: D.  Andria Rhein, MD    Dg Chest Port 1 View  08/05/2012  *RADIOLOGY REPORT*  Clinical Data: Evaluate infiltrates and/or edema  PORTABLE CHEST - 1 VIEW  Comparison: Portable chest x-ray of 08/04/2012  Findings:  There is little change in poor aeration with basilar haziness consistent with atelectasis and effusions.  There is cardiomegaly present with moderate pulmonary vascular congestion. Right central venous line tip overlies the lower SVC.  No bony abnormality is seen.  IMPRESSION: No significant change in cardiomegaly and pulmonary vascular congestion with effusions and basilar atelectasis.   Original Report Authenticated By: Dwyane Dee, M.D.    Dg Chest Port 1 View  08/04/2012  *RADIOLOGY REPORT*  Clinical Data: Effusions.  PORTABLE CHEST - 1 VIEW  Comparison: 08/03/2012.  Findings: Right central line tip caval atrial junction level.  No gross pneumothorax.  Heart slightly enlarged.  Asymmetric air space disease suggestive of pulmonary edema with small right-sided pleural effusion.  Basilar subsegmental atelectasis and left mid lung zone subsegmental atelectasis.  Basilar consolidation has progressed which may represent combination of pulmonary edema and atelectasis.  Underlying infiltrate not entirely excluded.  IMPRESSION: Slight decreased aeration lung bases.  Remainder of findings without significant change.   Original Report Authenticated By: Lacy Duverney, M.D.    Dg Chest Port 1 View  08/03/2012  *RADIOLOGY REPORT*  Clinical Data: Central line placement  PORTABLE CHEST - 1 VIEW  Comparison: 08/03/2012  Findings: Right jugular catheter tip has been placed with  the tip in the upper right atrium.  No pneumothorax.  Cardiac enlargement.  Vascular congestion with mild edema has progressed from the prior study.  Small pleural effusions and mild bibasilar atelectasis are present.  IMPRESSION: Central venous catheter tip in the upper right atrium no pneumothorax  Progression of pulmonary edema.   Original  Report Authenticated By: Janeece Riggers, M.D.    Portable Chest 1 View  07/31/2012  *RADIOLOGY REPORT*  Clinical Data: Pulmonary edema.  PORTABLE CHEST - 1 VIEW  Comparison: Chest CT 07/30/2012  Findings: Single view of the chest again demonstrates bibasilar densities.  Findings are suggestive for a combination of pleural fluid and volume loss.  Concern for airspace disease in the right lower lung.  Heart size is within normal limits.  Negative for a pneumothorax.  IMPRESSION: Bibasilar densities.  Findings suggest a combination of pleural fluid  and volume loss.  There is also concern for airspace disease in the right lower lung.   Original Report Authenticated By: Richarda Overlie, M.D.    Dg Chest Port 1v Same Day  08/03/2012  *RADIOLOGY REPORT*  Clinical Data: CHF  PORTABLE CHEST - 1 VIEW SAME DAY  Comparison: 07/31/2012  Findings: Heart is upper normal in size allowing for portable technique and low volumes.  Bibasilar airspace disease markedly improved with some residual probable subsegmental atelectasis.  No pneumothorax.  Tiny pleural effusions suspected.  IMPRESSION: Improved bilateral lower lobe consolidation.   Original Report Authenticated By: Jolaine Click, M.D.    2-D echo  Study Conclusions  - Left ventricle: The cavity size was normal. Wall thickness was increased in a pattern of mild LVH. Systolic function was severely reduced. The estimated ejection fraction was in the range of 20% to 25%. Diffuse hypokinesis. Doppler parameters are consistent with abnormal left ventricular relaxation (grade 1 diastolic dysfunction). - Ventricular septum: Septal motion showed paradox. - Mitral valve: Moderate regurgitation. - Left atrium: The atrium was mildly dilated.    Microbiology: Recent Results (from the past 240 hour(s))  MRSA PCR SCREENING     Status: Normal   Collection Time   07/30/12  6:49 PM      Component Value Range Status Comment   MRSA by PCR NEGATIVE  NEGATIVE Final   CLOSTRIDIUM  DIFFICILE BY PCR     Status: Normal   Collection Time   08/04/12  4:15 PM      Component Value Range Status Comment   C difficile by pcr NEGATIVE  NEGATIVE Final      Labs: Basic Metabolic Panel:  Lab 08/07/12 4540 08/06/12 0340 08/05/12 0500 08/04/12 1135 08/04/12 0300  NA 137 134* 136 137 136  K 3.6 3.7 3.8 3.9 3.9  CL 102 101 106 106 103  CO2 27 27 25 24 24   GLUCOSE 182* 179* 106* 127* 168*  BUN 8 9 11 14 17   CREATININE 0.77 0.80 0.81 0.88 1.02  CALCIUM 9.0 8.9 9.0 8.8 8.8  MG -- -- -- -- --  PHOS -- -- -- -- --   Liver Function Tests:  Lab 08/04/12 0300  AST 26  ALT 19  ALKPHOS 44  BILITOT 0.4  PROT 6.9  ALBUMIN 3.2*   No results found for this basename: LIPASE:5,AMYLASE:5 in the last 168 hours No results found for this basename: AMMONIA:5 in the last 168 hours CBC:  Lab 08/07/12 0449 08/06/12 0340 08/04/12 0300 08/03/12 0443  WBC 4.9 4.6 5.8 6.5  NEUTROABS -- -- -- --  HGB 10.8* 10.3* 11.4* 11.9*  HCT 32.4* 31.7* 34.2* 36.1  MCV 79.6 81.7 80.5 80.2  PLT 254 234 264 250   Cardiac Enzymes: No results found for this basename: CKTOTAL:5,CKMB:5,CKMBINDEX:5,TROPONINI:5 in the last 168 hours BNP: BNP (last 3 results)  Basename 08/04/12 0300 08/03/12 0443 07/30/12 1144  PROBNP 1926.0* 2238.0* 674.0*   CBG:  Lab 08/07/12 1200 08/07/12 0739 08/06/12 2127 08/06/12 1628 08/06/12 1142  GLUCAP 216* 197* 129* 241* 389*    Other lab data  Hemoglobin A1c: 8.3  TSH: 3.64  ANA negative  Rheumatoid factor: Greater than 600.   Signed:  Tarika Mckethan  [facility] 08/07/2012, 1:21 PM

## 2012-08-10 ENCOUNTER — Telehealth: Payer: Self-pay

## 2012-08-10 NOTE — Telephone Encounter (Signed)
[facility] called to report that pt was recently d/c form hospital. They followed up with her and wanted to inform MD that d/c amaryl on her own (low CBG's) but still takes her janumet. Hospital d/c advised a two week follow up but pt scheduled it for 09/03/11.

## 2012-08-13 ENCOUNTER — Ambulatory Visit: Payer: Medicare Other | Admitting: Internal Medicine

## 2012-08-13 ENCOUNTER — Ambulatory Visit (HOSPITAL_COMMUNITY)
Admit: 2012-08-13 | Discharge: 2012-08-13 | Disposition: A | Discharge status: Home / Self Care | Payer: Medicare Other | Attending: Internal Medicine | Admitting: Internal Medicine

## 2012-08-13 ENCOUNTER — Encounter (HOSPITAL_COMMUNITY): Payer: Self-pay

## 2012-08-13 VITALS — BP 134/74 | HR 84 | Wt 136.8 lb

## 2012-08-13 DIAGNOSIS — I5040 Unspecified combined systolic (congestive) and diastolic (congestive) heart failure: Secondary | ICD-10-CM | POA: Diagnosis not present

## 2012-08-13 DIAGNOSIS — I447 Left bundle-branch block, unspecified: Secondary | ICD-10-CM | POA: Insufficient documentation

## 2012-08-13 MED ORDER — CARVEDILOL 3.125 MG PO TABS: 3.1250 mg | ORAL_TABLET | Freq: Two times a day (BID) | ORAL | Status: DC

## 2012-08-13 NOTE — Patient Instructions (Addendum)
Add carvedilol 3.125 mg twice daily.  Follow up 3 weeks.

## 2012-08-13 NOTE — Progress Notes (Signed)
Referring Physician: [place]  Primary Care: Dr. Sanda Linger Primary Cardiologist: new  Weight Range: 141 pounds Baseline proBNP: 674 on 07/30/12  HPI: Ms. Dorman is a 79 y.o. AAF with history of diabetes, hypothyroidism, hypertension and newly diagnosed mixed diastolic and systolic heart failure during hospitalization on 07/30/12, EF 20-25%.  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   She was evaluated by her PCP on 12/19 for progressive dyspnea.  EKG showed LBBB therefore she was sent to the ER and placed on BiPap.  She had an elevated d-dimer but CTA chest neg for PE.  She was admitted for respiratory failure and found to have depressed LVEF of 20-25%.  She was diuresed and stay was c/b hypotension with acute renal failure requiring short coarse of dopamine.  Discharge Cr 0.77 (peaked 3.11).  Discharge weight 141 pounds.  She has been scheduled for stress test at [facility] on 08/18/11.    She has been referred to the HF clinic for new onset HF.  She feels well since discharge.  She is complaint with meds and weighs daily.  Home weight 137-139 pounds.  She is watching her sodium and fluid intake very closely.  She denies orthopnea/PND.  No edema.  She is ambulating without difficulty.   Review of Systems: [y] = yes, [ ]  = no   General: Weight gain [ ] ; Weight loss [ ] ; Anorexia [ ] ; Fatigue [ ] ; Fever [ ] ; Chills [ ] ; Weakness [ ]   Cardiac: Chest pain/pressure [ ] ; Resting SOB [ ] ; Exertional SOB [ ] ; Orthopnea [ ] ; Pedal Edema [ ] ; Palpitations [ ] ; Syncope [ ] ; Presyncope [ ] ; Paroxysmal nocturnal dyspnea[ ]   Pulmonary: Cough [ ] ; Wheezing[ ] ; Hemoptysis[ ] ; Sputum [ ] ; Snoring [ ]   GI: Vomiting[ ] ; Dysphagia[ ] ; Melena[ ] ; Hematochezia [ ] ; Heartburn[ ] ; Abdominal pain [ ] ; Constipation [ ] ; Diarrhea [ ] ; BRBPR [ ]   GU: Hematuria[ ] ; Dysuria [ ] ; Nocturia[ ]   Vascular: Pain in legs with walking [ ] ; Pain in feet with lying flat [ ] ; Non-healing sores [ ] ;  Stroke [ ] ; TIA [ ] ; Slurred speech [ ] ;  Neuro: Headaches[ ] ; Vertigo[ ] ; Seizures[ ] ; Paresthesias[ ] ;Blurred vision [ ] ; Diplopia [ ] ; Vision changes [ ]   Ortho/Skin: Arthritis [ ] ; Joint pain [ ] ; Muscle pain [ ] ; Joint swelling [ ] ; Back Pain [ ] ; Rash [ ]   Psych: Depression[ ] ; Anxiety[ ]   Heme: Bleeding problems [ ] ; Clotting disorders [ ] ; Anemia [ ]   Endocrine: Diabetes [place] ]; Thyroid dysfunction[ ]    Past Medical History  Diagnosis Date  . Hypothyroidism   . High cholesterol   . Osteoarthritis   . Diabetes mellitus   Diastolic and Systolic HF, EF 20-25% per echo 07/2012  Current Outpatient Prescriptions  Medication Sig Dispense Refill  . aspirin EC 81 MG EC tablet Take 1 tablet (81 mg total) by mouth daily.  30 tablet  0  . ergocalciferol (VITAMIN D2) 50000 UNITS capsule Take 50,000 Units by mouth once a week.      . furosemide (LASIX) 20 MG tablet Take 1 tablet (20 mg total) by mouth daily.  30 tablet  0  . glucose blood test strip 1 each by Other route as needed. Test twice daily.  Please dispense a 3 month supply      . Lancets (ACCU-CHEK MULTICLIX) lancets 1 each by Other route daily. Use as instructed       .  levothyroxine (SYNTHROID, LEVOTHROID) 100 MCG tablet Take 100 mcg by mouth daily. BRAND MEDICALLY NECESSARY.      Marland Kitchen lisinopril (PRINIVIL,ZESTRIL) 2.5 MG tablet Take 1 tablet (2.5 mg total) by mouth daily.  30 tablet  0  . Pitavastatin Calcium 4 MG TABS Take 1 tablet by mouth daily. For cholesterol.      . sitaGLIPtan-metformin (JANUMET) 50-1000 MG per tablet Take 1 tablet by mouth 2 (two) times daily with a meal.        Allergies  Allergen Reactions  . Statins     History   Social History  . Marital Status: Divorced    Spouse Name: N/A    Number of Children: N/A  . Years of Education: N/A   Occupational History  . Retired Engineer, civil (consulting)    Social History Main Topics  . Smoking status: Never Smoker   . Smokeless tobacco: Not on file  . Alcohol Use: No  .  Drug Use: No  . Sexually Active: Not Currently   Other Topics Concern  . Not on file   Social History Narrative   Regular Exercise-No    Family History  Problem Relation Age of Onset  . Diabetes Other   . Hypertension Other   . Uterine cancer Other     PHYSICAL EXAM: Filed Vitals:   08/13/12 1359  BP: 134/74  Pulse: 84  Weight: 136 lb 12.8 oz (62.052 kg)  SpO2: 99%    General:  Well appearing. No respiratory difficulty HEENT: normal Neck: supple. JVP 5-6 Carotids 2+ bilat; no bruits. No lymphadenopathy or thryomegaly appreciated. Cor: PMI nondisplaced. Regular rate & rhythm. No rubs, gallops or murmurs. Lungs: clear Abdomen: soft, nontender, nondistended. No hepatosplenomegaly. No bruits or masses. Good bowel sounds. Extremities: no cyanosis, clubbing, rash, edema Neuro: alert & oriented x 3, cranial nerves grossly intact. moves all 4 extremities w/o difficulty. Affect pleasant.   ASSESSMENT & PLAN:

## 2012-08-16 ENCOUNTER — Encounter (HOSPITAL_COMMUNITY): Payer: Self-pay

## 2012-08-16 DIAGNOSIS — I5022 Chronic systolic (congestive) heart failure: Secondary | ICD-10-CM | POA: Insufficient documentation

## 2012-08-16 NOTE — Assessment & Plan Note (Signed)
This is new per patient.  She is scheduled for stress test next week.  Will follow up on results and have discussed possible need for heart cath if need be.

## 2012-08-16 NOTE — Assessment & Plan Note (Addendum)
Have discussed the role of the HF clinic in full.  Volume status looks good.  Weight stable since discharge, encouraged her to continue daily weights.  Discussed use of sliding scale lasix for weight gain.  Will add carvedilol 3.125 mg BID and continue lisinopril 2.5 mg daily.  Will follow closely in clinic to titrate medications and repeat echo in 3 months.

## 2012-08-17 ENCOUNTER — Ambulatory Visit (HOSPITAL_COMMUNITY): Payer: Medicare Other | Attending: Cardiology | Admitting: Radiology

## 2012-08-17 VITALS — BP 116/61 | HR 66 | Ht 64.0 in | Wt 135.0 lb

## 2012-08-17 DIAGNOSIS — R0609 Other forms of dyspnea: Secondary | ICD-10-CM | POA: Diagnosis not present

## 2012-08-17 DIAGNOSIS — R0989 Other specified symptoms and signs involving the circulatory and respiratory systems: Secondary | ICD-10-CM | POA: Insufficient documentation

## 2012-08-17 DIAGNOSIS — I429 Cardiomyopathy, unspecified: Secondary | ICD-10-CM

## 2012-08-17 DIAGNOSIS — I447 Left bundle-branch block, unspecified: Secondary | ICD-10-CM | POA: Insufficient documentation

## 2012-08-17 DIAGNOSIS — E119 Type 2 diabetes mellitus without complications: Secondary | ICD-10-CM | POA: Insufficient documentation

## 2012-08-17 DIAGNOSIS — I5040 Unspecified combined systolic (congestive) and diastolic (congestive) heart failure: Secondary | ICD-10-CM

## 2012-08-17 DIAGNOSIS — R42 Dizziness and giddiness: Secondary | ICD-10-CM | POA: Insufficient documentation

## 2012-08-17 DIAGNOSIS — R0602 Shortness of breath: Secondary | ICD-10-CM

## 2012-08-17 MED ORDER — ADENOSINE (DIAGNOSTIC) 3 MG/ML IV SOLN
0.5600 mg/kg | Freq: Once | INTRAVENOUS | Status: AC
  Administered 2012-08-17: 34.2 mg via INTRAVENOUS

## 2012-08-17 MED ORDER — TECHNETIUM TC 99M SESTAMIBI GENERIC - CARDIOLITE
33.0000 | Freq: Once | INTRAVENOUS | Status: AC | PRN
  Administered 2012-08-17: 33 via INTRAVENOUS

## 2012-08-17 MED ORDER — TECHNETIUM TC 99M SESTAMIBI GENERIC - CARDIOLITE
11.0000 | Freq: Once | INTRAVENOUS | Status: AC | PRN
  Administered 2012-08-17: 11 via INTRAVENOUS

## 2012-08-17 NOTE — Progress Notes (Signed)
[facility] SITE 3 NUCLEAR MED [address] [place], [place] [address] [phone]    Cardiology Nuclear Med Study  [name] [name] is a 79 y.o. female     MRN : [id number]     DOB: 07-10-1934  Procedure Date: 08/17/2012  Nuclear Med Background Indication for Stress Test:  Evaluation for Ischemia and Post Hospital:[facility] 08/01/12 with acute respiratory failure History:  '04 EXB:MWUXLK, EF=60%; 07/31/12 Echo:EF=20-25% Cardiac Risk Factors: History of Smoking, LBBB, Lipids and NIDDM  Symptoms:  Dizziness, DOE, Fatigue and SOB   Nuclear Pre-Procedure Caffeine/Decaff Intake:  None NPO After: 8:00pm   Lungs:  Clear. O2 Sat: 98% on room air. IV 0.9% NS with Angio Cath:  22g  IV Site: R Hand  IV Started by:  Cathlyn Parsons, RN  Chest Size (in):  38 Cup Size: C  Height: 5\' 4"  (1.626 m)  Weight:  135 lb (61.236 kg)  BMI:  Body mass index is 23.17 kg/(m^2). Tech Comments:  No Diabetic med's this am;CBG 143    Nuclear Med Study 1 or 2 day study: 1 day  Stress Test Type:  Adenosine  Reading MD: Cassell Clement, MD  Order Authorizing Provider:  Truman Hayward  Resting Radionuclide: Technetium 62m Sestamibi  Resting Radionuclide Dose: 11.0 mCi   Stress Radionuclide:  Technetium 38m Sestamibi  Stress Radionuclide Dose: 33.0 mCi           Stress Protocol Rest HR: 66 Stress HR: 70  Rest BP: 116/61 Stress BP: 125/63  Exercise Time (min): n/a METS: n/a   Predicted Max HR: 144 bpm % Max HR: 48.61 bpm Rate Pressure Product: 8750    Dose of Adenosine (mg):  34.4 Dose of Lexiscan: n/a mg  Dose of Atropine (mg): n/a Dose of Dobutamine: n/a mcg/kg/min (at max HR)  Stress Test Technologist: Smiley Houseman, CMA-N  Nuclear Technologist:  Domenic Polite, CNMT     Rest Procedure:  Myocardial perfusion imaging was performed at rest 45 minutes following the intravenous administration of Technetium 37m Sestamibi.  Rest ECG: NSR-LBBB  Stress Procedure:  The patient received  IV adenosine at 140 mcg/kg/min for 4 minutes.  She c/o stomach ache with infusion.  Technetium 65m Sestamibi was injected at the 2 minute mark and quantitative spect images were obtained after a 45 minute delay.  Stress ECG: No significant change from baseline ECG  QPS Raw Data Images:  Normal; no motion artifact; normal heart/lung ratio. Stress Images:  Normal homogeneous uptake in all areas of the myocardium. Rest Images:  Normal homogeneous uptake in all areas of the myocardium. Subtraction (SDS):  No evidence of ischemia. Transient Ischemic Dilatation (Normal <1.22):  1.14 Lung/Heart Ratio (Normal <0.45):  0.38  Quantitative Gated Spect Images QGS EDV:  158 ml QGS ESV:  115 ml  Impression Exercise Capacity:  Adenosine study with no exercise. BP Response:  Normal blood pressure response. Clinical Symptoms:  No significant symptoms noted. ECG Impression:  Baseline:  LBBB.  EKG uninterpretable due to LBBB at rest and stress. Comparison with Prior Nuclear Study: Since prior nuclear study of 09/14/02, the LV EF has fallen from 60% to 27%.  Global hypokinesis is now present.  Overall Impression:  High risk stress nuclear study. No ischemia or scar seen. Normal apical thinning is present.  Severe LV systolic dysfunction.  LV Ejection Fraction: 27%.  LV Wall Motion:  Severe global hypokinesis.  Cassell Clement

## 2012-08-20 ENCOUNTER — Ambulatory Visit (HOSPITAL_COMMUNITY)
Admission: RE | Admit: 2012-08-20 | Discharge: 2012-08-20 | Disposition: A | Discharge status: Home / Self Care | Payer: Medicare Other | Source: Ambulatory Visit | Attending: Internal Medicine | Admitting: Internal Medicine

## 2012-08-20 ENCOUNTER — Encounter (HOSPITAL_COMMUNITY): Payer: Self-pay | Admitting: *Deleted

## 2012-08-20 VITALS — BP 126/68 | HR 82 | Wt 137.5 lb

## 2012-08-20 DIAGNOSIS — I5040 Unspecified combined systolic (congestive) and diastolic (congestive) heart failure: Secondary | ICD-10-CM | POA: Diagnosis not present

## 2012-08-20 NOTE — Addendum Note (Signed)
Encounter addended by: Hadassah Pais, PA on: 08/20/2012  4:13 PM<BR>     Documentation filed: Orders, PRL Based Order Sets

## 2012-08-20 NOTE — Progress Notes (Signed)
Patient ID: [name] [name], female   DOB: 12/28/1933, 78 y.o.   MRN: [id number] Referring Physician: [place]  Primary Care: Dr. Thomas Jones Primary Cardiologist: new  Weight Range: 141 pounds Baseline proBNP: 674 on 07/30/12  HPI: [name] [name] is a 78 y.o. AAF with history of diabetes, hypothyroidism, hypertension and newly diagnosed mixed diastolic and systolic heart failure during hospitalization on 07/30/12, EF 20-25%.  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   She was evaluated by her PCP on 12/19 for progressive dyspnea.  EKG showed LBBB therefore she was sent to the ER and placed on BiPap.  She had an elevated d-dimer but CTA chest neg for PE.  She was admitted for respiratory failure and found to have depressed LVEF of 20-25%.  She was diuresed and stay was c/b hypotension with acute renal failure requiring short coarse of dopamine.  Discharge Cr 0.77 (peaked 3.11).  Discharge weight 141 pounds.    She was seen in HF clinic in 1/2 and was doing well. Carvedilol added. She is complaint with meds and weighs daily.  Feels good. Home weight 135-137 pounds.  She is watching her sodium and fluid intake very closely.  She denies orthopnea/PND.  No edema.  She is ambulating without difficulty. Denies CP/   Underwent Myoview 08/17/12: Overall Impression: High risk stress nuclear study. No ischemia or scar seen. Normal apical thinning is present. Severe LV systolic dysfunction.  LV Ejection Fraction: 27%. LV Wall Motion: Severe global hypokinesis.  Here to discuss results of stress test.     Review of Systems: [y] = yes, [ ] = no   General: Weight gain [ ]; Weight loss [ ]; Anorexia [ ]; Fatigue [ ]; Fever [ ]; Chills [ ]; Weakness [ ]  Cardiac: Chest pain/pressure [ ]; Resting SOB [ ]; Exertional SOB [ ]; Orthopnea [ ]; Pedal Edema [ ]; Palpitations [ ]; Syncope [ ]; Presyncope [ ]; Paroxysmal nocturnal dyspnea[ ]  Pulmonary: Cough [ ]; Wheezing[ ]; Hemoptysis[ ];  Sputum [ ]; Snoring [ ]  GI: Vomiting[ ]; Dysphagia[ ]; Melena[ ]; Hematochezia [ ]; Heartburn[ ]; Abdominal pain [ ]; Constipation [ ]; Diarrhea [ ]; BRBPR [ ]  GU: Hematuria[ ]; Dysuria [ ]; Nocturia[ ]  Vascular: Pain in legs with walking [ ]; Pain in feet with lying flat [ ]; Non-healing sores [ ]; Stroke [ ]; TIA [ ]; Slurred speech [ ];  Neuro: Headaches[ ]; Vertigo[ ]; Seizures[ ]; Paresthesias[ ];Blurred vision [ ]; Diplopia [ ]; Vision changes [ ]  Ortho/Skin: Arthritis [ ]; Joint pain [ ]; Muscle pain [ ]; Joint swelling [ ]; Back Pain [ ]; Rash [ ]  Psych: Depression[ ]; Anxiety[ ]  Heme: Bleeding problems [ ]; Clotting disorders [ ]; Anemia [ ]  Endocrine: Diabetes [y ]; Thyroid dysfunction[ ]   Past Medical History  Diagnosis Date  . Hypothyroidism   . High cholesterol   . Osteoarthritis   . Diabetes mellitus   . CHF (congestive heart failure)   Diastolic and Systolic HF, EF 20-25% per echo 07/2012  Current Outpatient Prescriptions  Medication Sig Dispense Refill  . aspirin EC 81 MG EC tablet Take 1 tablet (81 mg total) by mouth daily.  30 tablet  0  . carvedilol (COREG) 3.125 MG tablet Take 1 tablet (3.125 mg total) by mouth 2 (two) times daily with a meal.  60 tablet  3  .   ergocalciferol (VITAMIN D2) 50000 UNITS capsule Take 50,000 Units by mouth once a week.      . furosemide (LASIX) 20 MG tablet Take 1 tablet (20 mg total) by mouth daily.  30 tablet  0  . glucose blood test strip 1 each by Other route as needed. Test twice daily.  Please dispense a 3 month supply      . Lancets (ACCU-CHEK MULTICLIX) lancets 1 each by Other route daily. Use as instructed       . levothyroxine (SYNTHROID, LEVOTHROID) 100 MCG tablet Take 100 mcg by mouth daily. BRAND MEDICALLY NECESSARY.      . lisinopril (PRINIVIL,ZESTRIL) 2.5 MG tablet Take 1 tablet (2.5 mg total) by mouth daily.  30 tablet  0  . Pitavastatin Calcium 4 MG TABS Take 1 tablet by mouth daily. For cholesterol.      .  sitaGLIPtan-metformin (JANUMET) 50-1000 MG per tablet Take 1 tablet by mouth 2 (two) times daily with a meal.        Allergies  Allergen Reactions  . Statins     History   Social History  . Marital Status: Divorced    Spouse Name: N/A    Number of Children: N/A  . Years of Education: N/A   Occupational History  . Retired Nurse    Social History Main Topics  . Smoking status: Never Smoker   . Smokeless tobacco: Not on file  . Alcohol Use: No  . Drug Use: No  . Sexually Active: Not Currently   Other Topics Concern  . Not on file   Social History Narrative   Regular Exercise-No    Family History  Problem Relation Age of Onset  . Diabetes Other   . Hypertension Other   . Uterine cancer Other     PHYSICAL EXAM: Filed Vitals:   08/20/12 1346  BP: 126/68  Pulse: 82  Weight: 137 lb 8 oz (62.37 kg)  SpO2: 98%    General:  Well appearing. No respiratory difficulty HEENT: normal Neck: supple. JVP 5-6 Carotids 2+ bilat; no bruits. No lymphadenopathy or thryomegaly appreciated. Cor: PMI nondisplaced. Regular rate & rhythm. No rubs, gallops or murmurs. Lungs: clear Abdomen: soft, nontender, nondistended. No hepatosplenomegaly. No bruits or masses. Good bowel sounds. Extremities: no cyanosis, clubbing, rash, edema Neuro: alert & oriented x 3, cranial nerves grossly intact. moves all 4 extremities w/o difficulty. Affect pleasant.   ASSESSMENT & PLAN:     

## 2012-08-20 NOTE — Addendum Note (Signed)
Encounter addended by: Noralee Space, RN on: 08/20/2012  2:29 PM<BR>     Documentation filed: Patient Instructions Section, Orders

## 2012-08-20 NOTE — Patient Instructions (Addendum)
Your physician has requested that you have a cardiac catheterization. Cardiac catheterization is used to diagnose and/or treat various heart conditions. Doctors may recommend this procedure for a number of different reasons. The most common reason is to evaluate chest pain. Chest pain can be a symptom of coronary artery disease (CAD), and cardiac catheterization can show whether plaque is narrowing or blocking your heart's arteries. This procedure is also used to evaluate the valves, as well as measure the blood flow and oxygen levels in different parts of your heart. For further information please visit [email or web address] Please follow instruction sheet, as given.  Your physician recommends that you schedule a follow-up appointment in: 3 weeks

## 2012-08-20 NOTE — Assessment & Plan Note (Signed)
She is doing very well. Now NYHA II. Tolerating her meds well. Looks euvolemic. We discussed results of stress test. Given cardiac risk factors and severe LV dysfunction we have decided to proceed with L & R heart catheterization. We discussed risks and indication. She wants to proceed. We will schedule in the near future.

## 2012-08-25 ENCOUNTER — Ambulatory Visit: Payer: Medicare Other | Admitting: Cardiovascular Disease

## 2012-08-27 ENCOUNTER — Encounter: Payer: Self-pay | Admitting: Internal Medicine

## 2012-08-27 ENCOUNTER — Other Ambulatory Visit (INDEPENDENT_AMBULATORY_CARE_PROVIDER_SITE_OTHER): Payer: Medicare Other

## 2012-08-27 DIAGNOSIS — E782 Mixed hyperlipidemia: Secondary | ICD-10-CM | POA: Diagnosis not present

## 2012-08-27 DIAGNOSIS — E119 Type 2 diabetes mellitus without complications: Secondary | ICD-10-CM | POA: Diagnosis not present

## 2012-08-27 DIAGNOSIS — E039 Hypothyroidism, unspecified: Secondary | ICD-10-CM

## 2012-08-27 LAB — URINALYSIS, ROUTINE W REFLEX MICROSCOPIC
Bilirubin Urine: NEGATIVE
Ketones, ur: NEGATIVE
Leukocytes, UA: NEGATIVE
Specific Gravity, Urine: 1.015 (ref 1.000–1.030)
Total Protein, Urine: NEGATIVE
pH: 6 (ref 5.0–8.0)

## 2012-08-27 LAB — CBC WITH DIFFERENTIAL/PLATELET
Basophils Absolute: 0 10*3/uL (ref 0.0–0.1)
Eosinophils Absolute: 0.2 10*3/uL (ref 0.0–0.7)
Eosinophils Relative: 2.8 % (ref 0.0–5.0)
HCT: 38.1 % (ref 36.0–46.0)
Lymphs Abs: 3.1 10*3/uL (ref 0.7–4.0)
MCHC: 33 g/dL (ref 30.0–36.0)
MCV: 81.3 fl (ref 78.0–100.0)
Monocytes Absolute: 0.4 10*3/uL (ref 0.1–1.0)
Neutrophils Relative %: 34 % — ABNORMAL LOW (ref 43.0–77.0)
Platelets: 201 10*3/uL (ref 150.0–400.0)
RDW: 15.3 % — ABNORMAL HIGH (ref 11.5–14.6)
WBC: 5.6 10*3/uL (ref 4.5–10.5)

## 2012-08-27 LAB — COMPREHENSIVE METABOLIC PANEL
ALT: 16 U/L (ref 0–35)
AST: 18 U/L (ref 0–37)
Alkaline Phosphatase: 45 U/L (ref 39–117)
CO2: 26 mEq/L (ref 19–32)
Creatinine, Ser: 0.9 mg/dL (ref 0.4–1.2)
GFR: 81.35 mL/min (ref >60.00)
Sodium: 138 mEq/L (ref 135–145)
Total Bilirubin: 0.6 mg/dL (ref 0.3–1.2)
Total Protein: 8 g/dL (ref 6.0–8.3)

## 2012-08-27 LAB — TSH: TSH: 5.75 u[IU]/mL — ABNORMAL HIGH (ref 0.35–5.50)

## 2012-09-01 ENCOUNTER — Encounter (HOSPITAL_BASED_OUTPATIENT_CLINIC_OR_DEPARTMENT_OTHER)
Admission: RE | Disposition: A | Discharge status: Home / Self Care | Payer: Self-pay | Source: Ambulatory Visit | Attending: Internal Medicine

## 2012-09-01 ENCOUNTER — Inpatient Hospital Stay (HOSPITAL_BASED_OUTPATIENT_CLINIC_OR_DEPARTMENT_OTHER)
Admission: RE | Admit: 2012-09-01 | Discharge: 2012-09-01 | Disposition: A | Discharge status: Home / Self Care | Payer: Medicare Other | Source: Ambulatory Visit | Attending: Internal Medicine | Admitting: Internal Medicine

## 2012-09-01 DIAGNOSIS — I2589 Other forms of chronic ischemic heart disease: Secondary | ICD-10-CM | POA: Insufficient documentation

## 2012-09-01 DIAGNOSIS — I2510 Atherosclerotic heart disease of native coronary artery without angina pectoris: Secondary | ICD-10-CM | POA: Insufficient documentation

## 2012-09-01 DIAGNOSIS — I428 Other cardiomyopathies: Secondary | ICD-10-CM | POA: Diagnosis not present

## 2012-09-01 DIAGNOSIS — I509 Heart failure, unspecified: Secondary | ICD-10-CM | POA: Insufficient documentation

## 2012-09-01 LAB — POCT I-STAT 3, VENOUS BLOOD GAS (G3P V)
Bicarbonate: 20 mEq/L (ref 20.0–24.0)
Bicarbonate: 20.3 mEq/L (ref 20.0–24.0)
O2 Saturation: 59 %
TCO2: 21 mmol/L (ref 0–100)
TCO2: 21 mmol/L (ref 0–100)
pCO2, Ven: 36.6 mmHg — ABNORMAL LOW (ref 45.0–50.0)
pCO2, Ven: 37 mmHg — ABNORMAL LOW (ref 45.0–50.0)
pH, Ven: 7.341 — ABNORMAL HIGH (ref 7.250–7.300)
pH, Ven: 7.352 — ABNORMAL HIGH (ref 7.250–7.300)

## 2012-09-01 LAB — POCT I-STAT 3, ART BLOOD GAS (G3+)
TCO2: 23 mmol/L (ref 0–100)
pCO2 arterial: 35.5 mmHg (ref 35.0–45.0)
pH, Arterial: 7.408 (ref 7.350–7.450)
pO2, Arterial: 98 mmHg (ref 80.0–100.0)

## 2012-09-01 LAB — POCT I-STAT GLUCOSE: Glucose, Bld: 163 mg/dL — ABNORMAL HIGH (ref 70–99)

## 2012-09-01 SURGERY — JV LEFT AND RIGHT HEART CATHETERIZATION WITH CORONARY ANGIOGRAM
Anesthesia: Moderate Sedation

## 2012-09-01 MED ORDER — SODIUM CHLORIDE 0.9 % IV SOLN: 250.0000 mL | INTRAVENOUS | Status: DC | PRN

## 2012-09-01 MED ORDER — ONDANSETRON HCL 4 MG/2ML IJ SOLN: 4.0000 mg | Freq: Four times a day (QID) | INTRAMUSCULAR | Status: DC | PRN

## 2012-09-01 MED ORDER — SODIUM CHLORIDE 0.9 % IJ SOLN: 3.0000 mL | Freq: Two times a day (BID) | INTRAMUSCULAR | Status: DC

## 2012-09-01 MED ORDER — SODIUM CHLORIDE 0.9 % IJ SOLN: 3.0000 mL | INTRAMUSCULAR | Status: DC | PRN

## 2012-09-01 MED ORDER — ACETAMINOPHEN 325 MG PO TABS: 650.0000 mg | ORAL_TABLET | ORAL | Status: DC | PRN

## 2012-09-01 MED ORDER — SODIUM CHLORIDE 0.9 % IV SOLN: INTRAVENOUS | Status: DC

## 2012-09-01 NOTE — CV Procedure (Signed)
Cardiac Cath Procedure Note  Indication: HF  Procedures performed:  1) Right heart cathererization 2) Selective coronary angiography 3) Left heart catheterization 4) Left ventriculogram  Description of procedure:     The risks and indication of the procedure were explained. Consent was signed and placed on the chart. An appropriate timeout was taken prior to the procedure. The right groin was prepped and draped in the routine sterile fashion and anesthetized with 1% local lidocaine.   A 5 FR arterial sheath was placed in the right femoral artery using a modified Seldinger technique. Standard catheters including a JL4, JR4 and angled pigtail were used. All catheter exchanges were made over a wire. A 7 FR venous sheath was placed in the right femoral vein using a modified Seldinger technique. A standard Swan-Ganz catheter was used for the procedure.   Complications:  None apparent  Total contrast = 90cc  Findings:  RA = 2 RV = 37/2/4 PA = 39/12 (22) PCW = 11 Fick cardiac output/index = 3.0/1.8 Thermo CO/CI = 2.7/1.6 PVR = 4.9 Woods (Fick) SVR = 2259  FA sat = 98% PA sat = 57%, 59%  Ao Pressure: 135/57 (88) LV Pressure:  136/15/20 There was no signficant gradient across the aortic valve on pullback.  Left main: Normal  LAD: Large vessel wraps apex. Gives off multiple small diagonals. 40% lesion in mid LAD. Mild plaque in apical LAD.  LCX: Nondominant. Mild to moderate non-obstructive plaque in mid vessel. Small OM-1 which is OK. Large OM-2 with tandem 90% lesions in very proximal segment. Large OM-3 with long 70-80 lesion in proximal segment.   RCA: Dominant. Proximal and mid vessel heavily calcified with diffuse 40% lesion.   LV-gram done in the RAO projection: Ejection fraction = 25-30%. Global HK. Mild MR.  Assessment: 1. CAD with high grade lesions in OM-2 and probably OM-3. Otherwise non-obstructive CAD 2. Mixed (primarily NICM) cardiomyopathy with EF 25-30% 3.  Relatively well compensated filling pressures with low cardiac output and markedly elevated SVR  Plan/Discussion:  Continue to titrate HF meds as tolerate. Suspect she would benefit from hydralazine/nitrates. Low threshold to start inotropes if functional status declines further. Will review films with interventional team for possible PCI OM-2 +/- OM-3  Arvilla Meres, MD 10:08 AM

## 2012-09-01 NOTE — OR Nursing (Signed)
Meal served 

## 2012-09-01 NOTE — H&P (View-Only) (Signed)
Patient ID: [name] [name], female   DOB: [birth year], 79 y.o.   MRN: [id number] Referring Physician: [place]  Primary Care: Dr. Sanda Linger Primary Cardiologist: new  Weight Range: 141 pounds Baseline proBNP: 674 on 07/30/12  HPI: [name] [name] is a 79 y.o. AAF with history of diabetes, hypothyroidism, hypertension and newly diagnosed mixed diastolic and systolic heart failure during hospitalization on 07/30/12, EF 20-25%.  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   She was evaluated by her PCP on 12/19 for progressive dyspnea.  EKG showed LBBB therefore she was sent to the ER and placed on BiPap.  She had an elevated d-dimer but CTA chest neg for PE.  She was admitted for respiratory failure and found to have depressed LVEF of 20-25%.  She was diuresed and stay was c/b hypotension with acute renal failure requiring short coarse of dopamine.  Discharge Cr 0.77 (peaked 3.11).  Discharge weight 141 pounds.    She was seen in HF clinic in 1/2 and was doing well. Carvedilol added. She is complaint with meds and weighs daily.  Feels good. Home weight 135-137 pounds.  She is watching her sodium and fluid intake very closely.  She denies orthopnea/PND.  No edema.  She is ambulating without difficulty. Denies CP/   Underwent Myoview 08/17/12: Overall Impression: High risk stress nuclear study. No ischemia or scar seen. Normal apical thinning is present. Severe LV systolic dysfunction.  LV Ejection Fraction: 27%. LV Wall Motion: Severe global hypokinesis.  Here to discuss results of stress test.     Review of Systems: [y] = yes, [ ]  = no   General: Weight gain [ ] ; Weight loss [ ] ; Anorexia [ ] ; Fatigue [ ] ; Fever [ ] ; Chills [ ] ; Weakness [ ]   Cardiac: Chest pain/pressure [ ] ; Resting SOB [ ] ; Exertional SOB [ ] ; Orthopnea [ ] ; Pedal Edema [ ] ; Palpitations [ ] ; Syncope [ ] ; Presyncope [ ] ; Paroxysmal nocturnal dyspnea[ ]   Pulmonary: Cough [ ] ; Wheezing[ ] ; Hemoptysis[ ] ;  Sputum [ ] ; Snoring [ ]   GI: Vomiting[ ] ; Dysphagia[ ] ; Melena[ ] ; Hematochezia [ ] ; Heartburn[ ] ; Abdominal pain [ ] ; Constipation [ ] ; Diarrhea [ ] ; BRBPR [ ]   GU: Hematuria[ ] ; Dysuria [ ] ; Nocturia[ ]   Vascular: Pain in legs with walking [ ] ; Pain in feet with lying flat [ ] ; Non-healing sores [ ] ; Stroke [ ] ; TIA [ ] ; Slurred speech [ ] ;  Neuro: Headaches[ ] ; Vertigo[ ] ; Seizures[ ] ; Paresthesias[ ] ;Blurred vision [ ] ; Diplopia [ ] ; Vision changes [ ]   Ortho/Skin: Arthritis [ ] ; Joint pain [ ] ; Muscle pain [ ] ; Joint swelling [ ] ; Back Pain [ ] ; Rash [ ]   Psych: Depression[ ] ; Anxiety[ ]   Heme: Bleeding problems [ ] ; Clotting disorders [ ] ; Anemia [ ]   Endocrine: Diabetes [place] ]; Thyroid dysfunction[ ]    Past Medical History  Diagnosis Date  . Hypothyroidism   . High cholesterol   . Osteoarthritis   . Diabetes mellitus   . CHF (congestive heart failure)   Diastolic and Systolic HF, EF 20-25% per echo 07/2012  Current Outpatient Prescriptions  Medication Sig Dispense Refill  . aspirin EC 81 MG EC tablet Take 1 tablet (81 mg total) by mouth daily.  30 tablet  0  . carvedilol (COREG) 3.125 MG tablet Take 1 tablet (3.125 mg total) by mouth 2 (two) times daily with a meal.  60 tablet  3  .  ergocalciferol (VITAMIN D2) 50000 UNITS capsule Take 50,000 Units by mouth once a week.      . furosemide (LASIX) 20 MG tablet Take 1 tablet (20 mg total) by mouth daily.  30 tablet  0  . glucose blood test strip 1 each by Other route as needed. Test twice daily.  Please dispense a 3 month supply      . Lancets (ACCU-CHEK MULTICLIX) lancets 1 each by Other route daily. Use as instructed       . levothyroxine (SYNTHROID, LEVOTHROID) 100 MCG tablet Take 100 mcg by mouth daily. BRAND MEDICALLY NECESSARY.      Marland Kitchen lisinopril (PRINIVIL,ZESTRIL) 2.5 MG tablet Take 1 tablet (2.5 mg total) by mouth daily.  30 tablet  0  . Pitavastatin Calcium 4 MG TABS Take 1 tablet by mouth daily. For cholesterol.      .  sitaGLIPtan-metformin (JANUMET) 50-1000 MG per tablet Take 1 tablet by mouth 2 (two) times daily with a meal.        Allergies  Allergen Reactions  . Statins     History   Social History  . Marital Status: Divorced    Spouse Name: N/A    Number of Children: N/A  . Years of Education: N/A   Occupational History  . Retired Engineer, civil (consulting)    Social History Main Topics  . Smoking status: Never Smoker   . Smokeless tobacco: Not on file  . Alcohol Use: No  . Drug Use: No  . Sexually Active: Not Currently   Other Topics Concern  . Not on file   Social History Narrative   Regular Exercise-No    Family History  Problem Relation Age of Onset  . Diabetes Other   . Hypertension Other   . Uterine cancer Other     PHYSICAL EXAM: Filed Vitals:   08/20/12 1346  BP: 126/68  Pulse: 82  Weight: 137 lb 8 oz (62.37 kg)  SpO2: 98%    General:  Well appearing. No respiratory difficulty HEENT: normal Neck: supple. JVP 5-6 Carotids 2+ bilat; no bruits. No lymphadenopathy or thryomegaly appreciated. Cor: PMI nondisplaced. Regular rate & rhythm. No rubs, gallops or murmurs. Lungs: clear Abdomen: soft, nontender, nondistended. No hepatosplenomegaly. No bruits or masses. Good bowel sounds. Extremities: no cyanosis, clubbing, rash, edema Neuro: alert & oriented x 3, cranial nerves grossly intact. moves all 4 extremities w/o difficulty. Affect pleasant.   ASSESSMENT & PLAN:

## 2012-09-01 NOTE — Interval H&P Note (Signed)
History and Physical Interval Note:  09/01/2012 9:37 AM  [name] [name]  has presented today for surgery, with the diagnosis of CHF, Abnormal stress test.  The various methods of treatment have been discussed with the patient and family. After consideration of risks, benefits and other options for treatment, the patient has consented to  Procedure(s) (LRB) with comments: JV LEFT AND RIGHT HEART CATHETERIZATION WITH CORONARY ANGIOGRAM (N/A) as a surgical intervention .  The patient's history has been reviewed, patient examined, no change in status, stable for surgery.  I have reviewed the patient's chart and labs.  Questions were answered to the patient's satisfaction.     Daniel Bensimhon

## 2012-09-01 NOTE — OR Nursing (Signed)
Dr Gala Romney at bedside to discuss results and treatment plan with pt

## 2012-09-01 NOTE — OR Nursing (Signed)
Tegaderm dressing applied, site level 0, bedrest begins at 1030 

## 2012-09-01 NOTE — OR Nursing (Signed)
Discharge instructions reviewed and signed, pt stated understanding, ambulated in hall without difficulty, site level 0, transported to son's car via wheelchair 

## 2012-09-02 ENCOUNTER — Ambulatory Visit: Payer: Medicare Other | Admitting: Internal Medicine

## 2012-09-03 ENCOUNTER — Encounter (HOSPITAL_COMMUNITY): Payer: Medicare Other

## 2012-09-08 ENCOUNTER — Encounter (HOSPITAL_COMMUNITY): Payer: Self-pay

## 2012-09-08 ENCOUNTER — Ambulatory Visit (HOSPITAL_COMMUNITY)
Admission: RE | Admit: 2012-09-08 | Discharge: 2012-09-08 | Disposition: A | Discharge status: Home / Self Care | Payer: Medicare Other | Source: Ambulatory Visit | Attending: Internal Medicine | Admitting: Internal Medicine

## 2012-09-08 ENCOUNTER — Observation Stay (HOSPITAL_COMMUNITY): Admission: AD | Admit: 2012-09-08 | Payer: Medicare Other | Source: Ambulatory Visit | Admitting: Internal Medicine

## 2012-09-08 ENCOUNTER — Encounter (HOSPITAL_COMMUNITY)
Admission: RE | Disposition: A | Discharge status: Home / Self Care | Payer: Self-pay | Source: Ambulatory Visit | Attending: Internal Medicine

## 2012-09-08 ENCOUNTER — Other Ambulatory Visit (HOSPITAL_COMMUNITY): Payer: Self-pay | Admitting: Internal Medicine

## 2012-09-08 ENCOUNTER — Ambulatory Visit (HOSPITAL_BASED_OUTPATIENT_CLINIC_OR_DEPARTMENT_OTHER)
Admit: 2012-09-08 | Discharge: 2012-09-08 | Disposition: A | Discharge status: Home / Self Care | Payer: Medicare Other | Attending: Internal Medicine | Admitting: Internal Medicine

## 2012-09-08 VITALS — BP 130/72 | HR 82 | Wt 140.8 lb

## 2012-09-08 DIAGNOSIS — I724 Aneurysm of artery of lower extremity: Secondary | ICD-10-CM | POA: Insufficient documentation

## 2012-09-08 DIAGNOSIS — Y840 Cardiac catheterization as the cause of abnormal reaction of the patient, or of later complication, without mention of misadventure at the time of the procedure: Secondary | ICD-10-CM | POA: Insufficient documentation

## 2012-09-08 DIAGNOSIS — R0989 Other specified symptoms and signs involving the circulatory and respiratory systems: Secondary | ICD-10-CM

## 2012-09-08 DIAGNOSIS — I729 Aneurysm of unspecified site: Secondary | ICD-10-CM

## 2012-09-08 DIAGNOSIS — I5040 Unspecified combined systolic (congestive) and diastolic (congestive) heart failure: Secondary | ICD-10-CM | POA: Diagnosis not present

## 2012-09-08 DIAGNOSIS — I999 Unspecified disorder of circulatory system: Secondary | ICD-10-CM | POA: Diagnosis not present

## 2012-09-08 HISTORY — PX: RIGHT HEART CATHETERIZATION: SHX5447

## 2012-09-08 SURGERY — RIGHT HEART CATH
Anesthesia: LOCAL

## 2012-09-08 MED ORDER — HYDROCODONE-ACETAMINOPHEN 5-325 MG PO TABS
ORAL_TABLET | ORAL | Status: AC
  Filled 2012-09-08: qty 2

## 2012-09-08 MED ORDER — METOPROLOL TARTRATE 1 MG/ML IV SOLN
INTRAVENOUS | Status: AC
  Filled 2012-09-08: qty 5

## 2012-09-08 MED ORDER — MORPHINE SULFATE 10 MG/ML IJ SOLN
INTRAMUSCULAR | Status: AC
  Filled 2012-09-08: qty 1

## 2012-09-08 MED ORDER — HYDROCODONE-ACETAMINOPHEN 5-325 MG PO TABS
1.0000 | ORAL_TABLET | ORAL | Status: DC | PRN
  Administered 2012-09-08: 1 via ORAL

## 2012-09-08 MED ORDER — FENTANYL CITRATE 0.05 MG/ML IJ SOLN
INTRAMUSCULAR | Status: AC
  Filled 2012-09-08: qty 2

## 2012-09-08 MED ORDER — HYDRALAZINE HCL 20 MG/ML IJ SOLN
INTRAMUSCULAR | Status: AC
  Filled 2012-09-08: qty 1

## 2012-09-08 NOTE — Progress Notes (Signed)
Pseudoaneurysm compression:  Pre-procedure - pt with right groin tenderness, pseudoaneurysm confirmed by [place]. BP 164/87, HR 85, RR 26, Sats 97% on RA.   Pt given total:  Lopressor 5 mg Hydralazine 10 mg    for BP control  Vicodin 5 mg Morphine 6 mg Fentanyl 25 mcg  For pain control  Compression lasted 20 minutes, at the end, visually confirmed thrombosed pseudoaneurysm. Pt still with minor pain, 2/10 and can give additional Vicodin prn.   Pt to be on bedrest for 3 hours, ambulate, get a recheck and be discharged from short stay if no problems. Pt in agreement with the plan of care. Will give Rx for Vicodin 5 mg 10 tabs. F/U as scheduled.  Theodore Demark, PA 09/08/2012 3:55 PM

## 2012-09-08 NOTE — Progress Notes (Signed)
Spoke to rhonda barrett np about pts besrest.  Pt is stable and family is requesting that she leave soon because of the weather.  Pt has walked and groin is a level 0.  Per Bjorn Loser no post ultrasound needed and may discharge if stable

## 2012-09-08 NOTE — Assessment & Plan Note (Addendum)
Cath results reviewed. Her numbers are quite tenuous; however she is feeling some better. Volume status elevated today.  She is currently only taking lasix prn therefore will increase to M, W, and F.  Reinforced need for daily weights and reviewed use of sliding scale diuretics. Have instructed to take extra lasix if weight increased to 135 pounds or more.  Will need close f/u and aggressive titration of meds.

## 2012-09-08 NOTE — Progress Notes (Signed)
UP AND WALKED AND TOL WELL AND RIGHT GROIN STABLE NO BLEEDING TO HEMATOMA

## 2012-09-08 NOTE — Patient Instructions (Addendum)
Take lasix 20 mg every Monday, Wednesday and Friday.   Follow up 3 weeks

## 2012-09-08 NOTE — Progress Notes (Addendum)
*  PRELIMINARY RESULTS* Vascular Ultrasound Right lower extremity arterial pseudoaneurysm evaluation has been completed.  Preliminary findings: There is a pseudo measuring 1.18cm with a neck measuring 3 mm in the right groin. 2:15pm.  Discussed results with Dr. Theodoro Parma nurse, Herbert Seta. She will discuss with Dr. Gala Romney about doing a pseudo compression today.  Successful pseudo compression after 15 total minutes compression. 3:57 PM  Patient to go to short stay for 3 hour bedrest, then ambulate, and recheck.   Farrel Demark, RDMS, RVT 09/08/2012,

## 2012-09-08 NOTE — Assessment & Plan Note (Addendum)
Will schedule ultrasound of R groin site post cath to evaluate for pseudoanrysm or AV fistula.  The patient will stay in house until results are reviewed as compression may be required.

## 2012-09-09 MED ORDER — LISINOPRIL 2.5 MG PO TABS: 2.5000 mg | ORAL_TABLET | Freq: Every day | ORAL | Status: DC

## 2012-09-09 MED ORDER — FUROSEMIDE 20 MG PO TABS: 20.0000 mg | ORAL_TABLET | Freq: Every day | ORAL | Status: DC

## 2012-09-09 MED ORDER — CARVEDILOL 3.125 MG PO TABS: 3.1250 mg | ORAL_TABLET | Freq: Two times a day (BID) | ORAL | Status: DC

## 2012-09-12 NOTE — Progress Notes (Signed)
Referring Physician: [place]  Primary Care: Dr. Sanda Linger Primary Cardiologist: new  Weight Range: 141 pounds Baseline proBNP: 674 on 07/30/12  HPI: [name] [name] is a 79 y.o. AAF with history of diabetes, hypothyroidism, hypertension and newly diagnosed mixed diastolic and systolic heart failure during hospitalization on 07/30/12, EF 20-25%.  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   She was evaluated by her PCP on 12/19 for progressive dyspnea.  EKG showed LBBB therefore she was sent to the ER and placed on BiPap.  She had an elevated d-dimer but CTA chest neg for PE.  She was admitted for respiratory failure and found to have depressed LVEF of 20-25%.  She was diuresed and stay was c/b hypotension with acute renal failure requiring short coarse of dopamine.  Discharge Cr 0.77 (peaked 3.11).  Discharge weight 141 pounds.    She was seen in HF clinic in 1/2 and was doing well. Carvedilol added. She is complaint with meds and weighs daily.  Feels good. Home weight 135-137 pounds.  She is watching her sodium and fluid intake very closely.  She denies orthopnea/PND.  No edema.  She is ambulating without difficulty. Denies CP/   Underwent Myoview 08/17/12: Overall Impression: High risk stress nuclear study. No ischemia or scar seen. Normal apical thinning is present. Severe LV systolic dysfunction. LV Ejection Fraction: 27%. LV Wall Motion: Severe global hypokinesis.  L & RHC 09/01/12 RA = 2  RV = 37/2/4  PA = 39/12 (22)  PCW = 11  Fick cardiac output/index = 3.0/1.8  Thermo CO/CI = 2.7/1.6  PVR = 4.9 Woods (Fick)  SVR = 2259  FA sat = 98%  PA sat = 57%, 59%  Ao Pressure: 135/57 (88)  LV Pressure: 136/15/20  There was no signficant gradient across the aortic valve on pullback.  Left main: Normal  LAD: Large vessel wraps apex. Gives off multiple small diagonals. 40% lesion in mid LAD. Mild plaque in apical LAD.  LCX: Nondominant. Mild to moderate  non-obstructive plaque in mid vessel. Small OM-1 which is OK. Large OM-2 with tandem 90% lesions in very proximal segment. Large OM-3 with long 70-80 lesion in proximal segment.  RCA: Dominant. Proximal and mid vessel heavily calcified with diffuse 40% lesion.  LV-gram done in the RAO projection: Ejection fraction = 25-30%. Global HK. Mild MR.  She returns for follow up today.  She is c/o right groin pain that shoots to right buttocks and right knee.  This started yesterday.  She denies pain in lower extremities.  She states after cath groin was fine and pain just started.  She has only been taking her lasix prn for increased weight but weight 135 pounds.  She denies orthopnea/PND or edema.     Review of Systems: All pertinent positives and negatives as in HPI, otherwise negative.   Past Medical History  Diagnosis Date  . Hypothyroidism   . High cholesterol   . Osteoarthritis   . Diabetes mellitus   . CHF (congestive heart failure)   Diastolic and Systolic HF, EF 20-25% per echo 07/2012  Current Outpatient Prescriptions  Medication Sig Dispense Refill  . aspirin EC 81 MG EC tablet Take 1 tablet (81 mg total) by mouth daily.  30 tablet  0  . carvedilol (COREG) 3.125 MG tablet Take 1 tablet (3.125 mg total) by mouth 2 (two) times daily with a meal.  180 tablet  3  . ergocalciferol (VITAMIN D2) 50000 UNITS  capsule Take 50,000 Units by mouth once a week.      . furosemide (LASIX) 20 MG tablet Take 1 tablet (20 mg total) by mouth daily.  90 tablet  3  . glucose blood test strip 1 each by Other route as needed. Test twice daily.  Please dispense a 3 month supply      . Lancets (ACCU-CHEK MULTICLIX) lancets 1 each by Other route daily. Use as instructed       . levothyroxine (SYNTHROID, LEVOTHROID) 100 MCG tablet Take 100 mcg by mouth daily. BRAND MEDICALLY NECESSARY.      Marland Kitchen lisinopril (PRINIVIL,ZESTRIL) 2.5 MG tablet Take 1 tablet (2.5 mg total) by mouth daily.  90 tablet  3  . Pitavastatin  Calcium 4 MG TABS Take 1 tablet by mouth daily. For cholesterol.      . sitaGLIPtan-metformin (JANUMET) 50-1000 MG per tablet Take 1 tablet by mouth 2 (two) times daily with a meal.        Allergies  Allergen Reactions  . Statins     History   Social History  . Marital Status: Divorced    Spouse Name: N/A    Number of Children: N/A  . Years of Education: N/A   Occupational History  . Retired Engineer, civil (consulting)    Social History Main Topics  . Smoking status: Never Smoker   . Smokeless tobacco: Not on file  . Alcohol Use: No  . Drug Use: No  . Sexually Active: Not Currently   Other Topics Concern  . Not on file   Social History Narrative   Regular Exercise-No    Family History  Problem Relation Age of Onset  . Diabetes Other   . Hypertension Other   . Uterine cancer Other     PHYSICAL EXAM: Filed Vitals:   09/08/12 1141  BP: 130/72  Pulse: 82  Weight: 140 lb 12.8 oz (63.866 kg)  SpO2: 98%    General:  Well appearing. No respiratory difficulty HEENT: normal Neck: supple. JVP 7-8 Carotids 2+ bilat; no bruits. No lymphadenopathy or thryomegaly appreciated. Cor: PMI nondisplaced. Regular rate & rhythm. No rubs, gallops or murmurs. Lungs: clear Abdomen: soft, nontender, nondistended. No hepatosplenomegaly. No bruits or masses. Good bowel sounds. Extremities: no cyanosis, clubbing, rash, tr edema.  Rt groin with subdermal hematoma and mild bruit.  Neuro: alert & oriented x 3, cranial nerves grossly intact. moves all 4 extremities w/o difficulty. Affect pleasant.   ASSESSMENT & PLAN:

## 2012-09-15 ENCOUNTER — Other Ambulatory Visit: Payer: Self-pay | Admitting: Internal Medicine

## 2012-09-17 ENCOUNTER — Encounter (HOSPITAL_COMMUNITY): Payer: Medicare Other

## 2012-10-13 ENCOUNTER — Encounter (HOSPITAL_COMMUNITY): Payer: Medicare Other

## 2012-11-03 ENCOUNTER — Encounter (HOSPITAL_COMMUNITY): Payer: Medicare Other

## 2012-11-10 ENCOUNTER — Ambulatory Visit (HOSPITAL_COMMUNITY)
Admission: RE | Admit: 2012-11-10 | Discharge: 2012-11-10 | Disposition: A | Discharge status: Home / Self Care | Payer: Medicare Other | Source: Ambulatory Visit | Attending: Internal Medicine | Admitting: Internal Medicine

## 2012-11-10 VITALS — BP 132/78 | HR 77 | Wt 141.0 lb

## 2012-11-10 DIAGNOSIS — I5040 Unspecified combined systolic (congestive) and diastolic (congestive) heart failure: Secondary | ICD-10-CM | POA: Insufficient documentation

## 2012-11-10 MED ORDER — CARVEDILOL 3.125 MG PO TABS: 6.2500 mg | ORAL_TABLET | Freq: Two times a day (BID) | ORAL | Status: DC

## 2012-11-10 NOTE — Progress Notes (Signed)
Patient ID: [name] [name], female   DOB: [birth year], 79 y.o.   MRN: [id number] Referring Physician: [place]  Primary Care: Dr. Sanda Linger  Weight Range: 141 pounds Baseline proBNP: 674 on 07/30/12  HPI: [name] [name] is a 79 y.o. AAF with history of diabetes, hypothyroidism, hypertension and newly diagnosed mixed diastolic and systolic heart failure during hospitalization on 07/30/12, EF 20-25%.  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   She was evaluated by her PCP on 12/19 for progressive dyspnea.  EKG showed LBBB therefore she was sent to the ER and placed on BiPap.  She had an elevated d-dimer but CTA chest neg for PE.  She was admitted for respiratory failure and found to have depressed LVEF of 20-25%.  She was diuresed and stay was c/b hypotension with acute renal failure requiring short coarse of dopamine.  Discharge Cr 0.77 (peaked 3.11).  Discharge weight 141 pounds.    Underwent Myoview 08/17/12: Overall Impression: High risk stress nuclear study. No ischemia or scar seen. Normal apical thinning is present. Severe LV systolic dysfunction. LV Ejection Fraction: 27%. LV Wall Motion: Severe global hypokinesis.  L & RHC 09/01/12 RA = 2  RV = 37/2/4  PA = 39/12 (22)  PCW = 11  Fick cardiac output/index = 3.0/1.8  Thermo CO/CI = 2.7/1.6  PVR = 4.9 Woods (Fick)  SVR = 2259  FA sat = 98%  PA sat = 57%, 59%  Ao Pressure: 135/57 (88)  LV Pressure: 136/15/20  There was no signficant gradient across the aortic valve on pullback.  Left main: Normal  LAD: Large vessel wraps apex. Gives off multiple small diagonals. 40% lesion in mid LAD. Mild plaque in apical LAD.  LCX: Nondominant. Mild to moderate non-obstructive plaque in mid vessel. Small OM-1 which is OK. Large OM-2 with tandem 90% lesions in very proximal segment. Large OM-3 with long 70-80 lesion in proximal segment.  RCA: Dominant. Proximal and mid vessel heavily calcified with diffuse 40% lesion.   LV-gram done in the RAO projection: Ejection fraction = 25-30%. Global HK. Mild MR.  She returns for follow up today.  Last visit she is was instructed to take Lasix M-W-F however she is taking it daily. She is able to walk up steps and walk up inclined surfaces.  Denies SOB/PND/Orthopnea. Complains of fatigue in her joints. Weight at home 136 pounds. 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 Complaint with medications.    Review of Systems: All pertinent positives and negatives as in HPI, otherwise negative.   Past Medical History  Diagnosis Date  . Hypothyroidism   . High cholesterol   . Osteoarthritis   . Diabetes mellitus   . CHF (congestive heart failure)   Diastolic and Systolic HF, EF 20-25% per echo 07/2012  Current Outpatient Prescriptions  Medication Sig Dispense Refill  . aspirin EC 81 MG EC tablet Take 1 tablet (81 mg total) by mouth daily.  30 tablet  0  . carvedilol (COREG) 3.125 MG tablet Take 1 tablet (3.125 mg total) by mouth 2 (two) times daily with a meal.  180 tablet  3  . ergocalciferol (VITAMIN D2) 50000 UNITS capsule Take 50,000 Units by mouth once a week.      . furosemide (LASIX) 20 MG tablet Take 1 tablet (20 mg total) by mouth daily.  90 tablet  3  . glucose blood test strip 1 each by Other route as needed. Test twice daily.  Please  dispense a 3 month supply      . Lancets (ACCU-CHEK MULTICLIX) lancets 1 each by Other route daily. Use as instructed       . lisinopril (PRINIVIL,ZESTRIL) 2.5 MG tablet Take 1 tablet (2.5 mg total) by mouth daily.  90 tablet  3  . Pitavastatin Calcium 4 MG TABS Take 1 tablet by mouth daily. For cholesterol.      Marland Kitchen SYNTHROID 100 MCG tablet TAKE 1 TABLET DAILY  90 tablet  0  . sitaGLIPtan-metformin (JANUMET) 50-1000 MG per tablet Take 1 tablet by mouth 2 (two) times daily with a meal.       No current facility-administered medications for this encounter.    Allergies  Allergen Reactions  . Statins     History   Social  History  . Marital Status: Divorced    Spouse Name: N/A    Number of Children: N/A  . Years of Education: N/A   Occupational History  . Retired Engineer, civil (consulting)    Social History Main Topics  . Smoking status: Never Smoker   . Smokeless tobacco: Not on file  . Alcohol Use: No  . Drug Use: No  . Sexually Active: Not Currently   Other Topics Concern  . Not on file   Social History Narrative   Regular Exercise-No          Family History  Problem Relation Age of Onset  . Diabetes Other   . Hypertension Other   . Uterine cancer Other     PHYSICAL EXAM: Filed Vitals:   11/10/12 1119  BP: 132/78  Pulse: 77  Weight: 141 lb (63.957 kg)  SpO2: 98%    General:  Well appearing. No respiratory difficulty HEENT: normal Neck: supple. JVP 5-6 Carotids 2+ bilat; no bruits. No lymphadenopathy or thryomegaly appreciated. Cor: PMI nondisplaced. Regular rate & rhythm. No rubs,  Murmurs. S3+ Lungs: clear Abdomen: soft, nontender, nondistended. No hepatosplenomegaly. No bruits or masses. Good bowel sounds. Extremities: no cyanosis, clubbing, rash, edema.   Neuro: alert & oriented x 3, cranial nerves grossly intact. moves all 4 extremities w/o difficulty. Affect pleasant.   ASSESSMENT & PLAN:

## 2012-11-10 NOTE — Patient Instructions (Addendum)
Follow up in 1 month with Dr Gala Romney  Take carvedilol 6.25 mg twice a day  Do the following things EVERYDAY: 1) Weigh yourself in the morning before breakfast. Write it down and keep it in a log. 2) Take your medicines as prescribed 3) Eat low salt foods-Limit salt (sodium) to 2000 mg per day.  4) Stay as active as you can everyday 5) Limit all fluids for the day to less than 2 liters

## 2012-11-10 NOTE — Assessment & Plan Note (Signed)
NYHA II. Functionally doing well and able to walk up without dyspnea. Volume status stable. Continue current diuretic regimen. Increase carvedilol 6.25 mg twice a day. Continue to titrate HF medications as tolerated. Repeat ECHO in a couple of months. Follow up in 1 month.

## 2012-11-12 ENCOUNTER — Other Ambulatory Visit (HOSPITAL_COMMUNITY): Payer: Self-pay | Admitting: *Deleted

## 2012-11-12 MED ORDER — CARVEDILOL 6.25 MG PO TABS: 6.2500 mg | ORAL_TABLET | Freq: Two times a day (BID) | ORAL | Status: DC

## 2012-12-10 ENCOUNTER — Encounter (HOSPITAL_COMMUNITY): Payer: Medicare Other

## 2012-12-10 DIAGNOSIS — B351 Tinea unguium: Secondary | ICD-10-CM | POA: Diagnosis not present

## 2012-12-10 DIAGNOSIS — M79609 Pain in unspecified limb: Secondary | ICD-10-CM | POA: Diagnosis not present

## 2012-12-29 ENCOUNTER — Ambulatory Visit (HOSPITAL_COMMUNITY)
Admission: RE | Admit: 2012-12-29 | Discharge: 2012-12-29 | Disposition: A | Discharge status: Home / Self Care | Payer: Medicare Other | Source: Ambulatory Visit | Attending: Internal Medicine | Admitting: Internal Medicine

## 2012-12-29 VITALS — BP 118/58 | HR 84 | Ht 64.0 in | Wt 142.8 lb

## 2012-12-29 DIAGNOSIS — I5040 Unspecified combined systolic (congestive) and diastolic (congestive) heart failure: Secondary | ICD-10-CM | POA: Diagnosis not present

## 2012-12-29 DIAGNOSIS — I509 Heart failure, unspecified: Secondary | ICD-10-CM | POA: Diagnosis not present

## 2012-12-29 NOTE — Assessment & Plan Note (Addendum)
NYHA III.  Volume status well controlled.  With her symptoms I would like to add digoxin but currently she is reluctant to titrate medications or add digoxin due to fatigue.  I have spent extensive amount of time discussing the purpose of med titration but again she remains guarded.  We have discussed options and she is willing to undergo CPX testing to determine if functional status is limited by her HF.  If this is the case she is willing to add digoxin as well as titrate medications.  Will schedule within the next 2 weeks and bring her back to discuss results.

## 2012-12-29 NOTE — Progress Notes (Signed)
Referring Physician: [place]  Primary Care: Dr. Sanda Linger  Weight Range: 141 pounds Baseline proBNP: 674 on 07/30/12  HPI: [name] [name] is a 79 y.o. AAF with history of diabetes, hypothyroidism, hypertension and newly diagnosed mixed diastolic and systolic heart failure during hospitalization on 07/30/12, EF 20-25%.  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, [place].  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   She was evaluated by her PCP on 12/19 for progressive dyspnea.  EKG showed LBBB therefore she was sent to the ER and placed on BiPap.  She had an elevated d-dimer but CTA chest neg for PE.  She was admitted for respiratory failure and found to have depressed LVEF of 20-25%.  She was diuresed and stay was c/b hypotension with acute renal failure requiring short coarse of dopamine.  Discharge Cr 0.77 (peaked 3.11).  Discharge weight 141 pounds.    Underwent Myoview 08/17/12: Overall Impression: High risk stress nuclear study. No ischemia or scar seen. Normal apical thinning is present. Severe LV systolic dysfunction. LV Ejection Fraction: 27%. LV Wall Motion: Severe global hypokinesis.  L & RHC 09/01/12 RA = 2  RV = 37/2/4  PA = 39/12 (22)  PCW = 11  Fick cardiac output/index = 3.0/1.8  Thermo CO/CI = 2.7/1.6  PVR = 4.9 Woods (Fick)  SVR = 2259  FA sat = 98%  PA sat = 57%, 59%  Ao Pressure: 135/57 (88)  LV Pressure: 136/15/20  There was no signficant gradient across the aortic valve on pullback.  Left main: Normal  LAD: Large vessel wraps apex. Gives off multiple small diagonals. 40% lesion in mid LAD. Mild plaque in apical LAD.  LCX: Nondominant. Mild to moderate non-obstructive plaque in mid vessel. Small OM-1 which is OK. Large OM-2 with tandem 90% lesions in very proximal segment. Large OM-3 with long 70-80 lesion in proximal segment.  RCA: Dominant. Proximal and mid vessel heavily calcified with diffuse 40% lesion.  LV-gram done in the RAO projection: Ejection fraction = 25-30%. Global HK. Mild  MR.  She returns for follow up today.  She feels pretty good today but continues to be fatigued.  States that she felt this way prior to carvedilol titration.  She is not ambulating much due to lower extremities getting tired, even with walking to the mailbox.  Of note, her mailbox is up an incline.  She denies dyspnea, orthopnea and PND.  No chest pain.  Weight 135-136 pounds.   Review of Systems: All pertinent positives and negatives as in HPI, otherwise negative.   Past Medical History  Diagnosis Date  . Hypothyroidism   . High cholesterol   . Osteoarthritis   . Diabetes mellitus   . CHF (congestive heart failure)   Diastolic and Systolic HF, EF 20-25% per echo 07/2012  Current Outpatient Prescriptions  Medication Sig Dispense Refill  . aspirin EC 81 MG EC tablet Take 1 tablet (81 mg total) by mouth daily.  30 tablet  0  . carvedilol (COREG) 6.25 MG tablet Take 1 tablet (6.25 mg total) by mouth 2 (two) times daily with a meal.  180 tablet  3  . ergocalciferol (VITAMIN D2) 50000 UNITS capsule Take 50,000 Units by mouth once a week.      . furosemide (LASIX) 20 MG tablet Take 1 tablet (20 mg total) by mouth daily.  90 tablet  3  . glucose blood test strip 1 each by Other route as needed. Test twice daily.  Please dispense a 3 month supply      .  Lancets (ACCU-CHEK MULTICLIX) lancets 1 each by Other route daily. Use as instructed       . lisinopril (PRINIVIL,ZESTRIL) 2.5 MG tablet Take 1 tablet (2.5 mg total) by mouth daily.  90 tablet  3  . Pitavastatin Calcium 4 MG TABS Take 1 tablet by mouth daily. For cholesterol.      . sitaGLIPtan-metformin (JANUMET) 50-1000 MG per tablet Take 1 tablet by mouth 2 (two) times daily with a meal.      . SYNTHROID 100 MCG tablet TAKE 1 TABLET DAILY  90 tablet  0   No current facility-administered medications for this encounter.    Allergies  Allergen Reactions  . Statins     History   Social History  . Marital Status: Divorced    Spouse  Name: N/A    Number of Children: N/A  . Years of Education: N/A   Occupational History  . Retired Engineer, civil (consulting)    Social History Main Topics  . Smoking status: Never Smoker   . Smokeless tobacco: Not on file  . Alcohol Use: No  . Drug Use: No  . Sexually Active: Not Currently   Other Topics Concern  . Not on file   Social History Narrative   Regular Exercise-No          Family History  Problem Relation Age of Onset  . Diabetes Other   . Hypertension Other   . Uterine cancer Other     PHYSICAL EXAM: Filed Vitals:   12/29/12 1111  BP: 118/58  Pulse: 84  Height: 5\' 4"  (1.626 m)  Weight: 142 lb 12 oz (64.751 kg)  SpO2: 96%    General:  Well appearing. No respiratory difficulty HEENT: normal Neck: supple. JVP 5-6 Carotids 2+ bilat; no bruits. No lymphadenopathy or thryomegaly appreciated. Cor: PMI nondisplaced. Regular rate & rhythm. No rubs,  Murmurs. S3+ Lungs: clear Abdomen: soft, nontender, nondistended. No hepatosplenomegaly. No bruits or masses. Good bowel sounds. Extremities: no cyanosis, clubbing, rash, edema.   Neuro: alert & oriented x 3, cranial nerves grossly intact. moves all 4 extremities w/o difficulty. Affect pleasant.   ASSESSMENT & PLAN:

## 2013-01-19 ENCOUNTER — Encounter (HOSPITAL_COMMUNITY): Payer: Medicare Other

## 2013-02-16 ENCOUNTER — Encounter (HOSPITAL_COMMUNITY): Payer: Self-pay

## 2013-02-16 ENCOUNTER — Ambulatory Visit (HOSPITAL_COMMUNITY)
Admission: RE | Admit: 2013-02-16 | Discharge: 2013-02-16 | Disposition: A | Discharge status: Home / Self Care | Payer: Medicare Other | Source: Ambulatory Visit | Attending: Internal Medicine | Admitting: Internal Medicine

## 2013-02-16 VITALS — BP 130/70 | HR 72 | Wt 142.8 lb

## 2013-02-16 DIAGNOSIS — I2510 Atherosclerotic heart disease of native coronary artery without angina pectoris: Secondary | ICD-10-CM | POA: Insufficient documentation

## 2013-02-16 DIAGNOSIS — I5040 Unspecified combined systolic (congestive) and diastolic (congestive) heart failure: Secondary | ICD-10-CM | POA: Diagnosis not present

## 2013-02-16 DIAGNOSIS — I5042 Chronic combined systolic (congestive) and diastolic (congestive) heart failure: Secondary | ICD-10-CM | POA: Insufficient documentation

## 2013-02-16 DIAGNOSIS — I25810 Atherosclerosis of coronary artery bypass graft(s) without angina pectoris: Secondary | ICD-10-CM | POA: Insufficient documentation

## 2013-02-16 MED ORDER — CARVEDILOL 6.25 MG PO TABS: 9.3750 mg | ORAL_TABLET | Freq: Two times a day (BID) | ORAL | Status: DC

## 2013-02-16 NOTE — Assessment & Plan Note (Signed)
Doing well clinically. Asymptomatic. Continue current regimen. She has been intolerant of high potency statins due to myalgia. Continue pitvastatin. Lipids followed by Dr. Yetta Barre. Goal LDL < 70.

## 2013-02-16 NOTE — Assessment & Plan Note (Signed)
Doing quite well. NYHA II-III. Volume status looks fine. No further s3. We will titrate carvedilol to 9.375 bid and send her for repeat echo. If EF remains depressed will need to consider ICD. Will see her back in 4 weeks for continued med titration.

## 2013-02-16 NOTE — Patient Instructions (Addendum)
Increase Carvedilol to 1 & 1/2 tabs Twice daily   Your physician recommends that you schedule a follow-up appointment in: 1 month with an echocardiogram

## 2013-02-16 NOTE — Addendum Note (Signed)
Encounter addended by: Noralee Space, RN on: 02/16/2013 11:20 AM<BR>     Documentation filed: Patient Instructions Section, Orders

## 2013-02-16 NOTE — Progress Notes (Signed)
Referring Physician: [place]  Primary Care: Dr. Sanda Linger  Weight Range: 141 pounds Baseline proBNP: 674 on 07/30/12  HPI: Ms. [name] [name] is a 79 y.o. AAF with history of diabetes, hypothyroidism, hypertension and newly diagnosed mixed diastolic and systolic heart failure during hospitalization on 07/30/12, EF 20-25%.  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   She was evaluated by her PCP on 12/19 for progressive dyspnea.  EKG showed LBBB therefore she was sent to the ER and placed on BiPap.  She had an elevated d-dimer but CTA chest neg for PE.  She was admitted for respiratory failure and found to have depressed LVEF of 20-25%.  She was diuresed and stay was c/b hypotension with acute renal failure requiring short coarse of dopamine.  Discharge Cr 0.77 (peaked 3.11).  Discharge weight 141 pounds.    Underwent Myoview 08/17/12: Overall Impression: High risk stress nuclear study. No ischemia or scar seen. Normal apical thinning is present. Severe LV systolic dysfunction. LV Ejection Fraction: 27%. LV Wall Motion: Severe global hypokinesis.  L & RHC 09/01/12 RA = 2  RV = 37/2/4  PA = 39/12 (22)  PCW = 11  Fick cardiac output/index = 3.0/1.8  Thermo CO/CI = 2.7/1.6  PVR = 4.9 Woods (Fick)  SVR = 2259  FA sat = 98%  PA sat = 57%, 59%  Ao Pressure: 135/57 (88)  LV Pressure: 136/15/20  There was no signficant gradient across the aortic valve on pullback.  Left main: Normal  LAD: Large vessel wraps apex. Gives off multiple small diagonals. 40% lesion in mid LAD. Mild plaque in apical LAD.  LCX: Nondominant. Mild to moderate non-obstructive plaque in mid vessel. Small OM-1 which is OK. Large OM-2 with tandem 90% lesions in very proximal segment. Large OM-3 with long 70-80 lesion in proximal segment.  RCA: Dominant. Proximal and mid vessel heavily calcified with diffuse 40% lesion.  LV-gram done in the RAO projection: Ejection fraction = 25-30%. Global HK. Mild  MR.\  CAD treated medically as she was not having CP and LV dysfunction felt to be out of proportion to CAD.  She returns for follow up today.  She feels pretty good. Doing office work for 3 hours.  Fatigued at times. Able to do all activities without CP or dyspnea. Able to walk up and down hill to her mailbox without problem (about 100 yards). In the store gets around without a problem. Weighing every day.  Weight 137 pounds. Very stable. No problem with meds.    Review of Systems: All pertinent positives and negatives as in HPI, otherwise negative.   Past Medical History  Diagnosis Date  . Hypothyroidism   . High cholesterol   . Osteoarthritis   . Diabetes mellitus   . CHF (congestive heart failure)   Diastolic and Systolic HF, EF 20-25% per echo 07/2012  Current Outpatient Prescriptions  Medication Sig Dispense Refill  . aspirin EC 81 MG EC tablet Take 1 tablet (81 mg total) by mouth daily.  30 tablet  0  . carvedilol (COREG) 6.25 MG tablet Take 1 tablet (6.25 mg total) by mouth 2 (two) times daily with a meal.  180 tablet  3  . ergocalciferol (VITAMIN D2) 50000 UNITS capsule Take 50,000 Units by mouth once a week.      . furosemide (LASIX) 20 MG tablet Take 1 tablet (20 mg total) by mouth daily.  90 tablet  3  . glucose blood test strip  1 each by Other route as needed. Test twice daily.  Please dispense a 3 month supply      . Lancets (ACCU-CHEK MULTICLIX) lancets 1 each by Other route daily. Use as instructed       . lisinopril (PRINIVIL,ZESTRIL) 2.5 MG tablet Take 1 tablet (2.5 mg total) by mouth daily.  90 tablet  3  . Pitavastatin Calcium 4 MG TABS Take 1 tablet by mouth daily. For cholesterol.      . sitaGLIPtan-metformin (JANUMET) 50-1000 MG per tablet Take 1 tablet by mouth 2 (two) times daily with a meal.      . SYNTHROID 100 MCG tablet TAKE 1 TABLET DAILY  90 tablet  0   No current facility-administered medications for this encounter.    Allergies  Allergen Reactions   . Statins     History   Social History  . Marital Status: Divorced    Spouse Name: N/A    Number of Children: N/A  . Years of Education: N/A   Occupational History  . Retired Engineer, civil (consulting)    Social History Main Topics  . Smoking status: Never Smoker   . Smokeless tobacco: Not on file  . Alcohol Use: No  . Drug Use: No  . Sexually Active: Not Currently   Other Topics Concern  . Not on file   Social History Narrative   Regular Exercise-No          Family History  Problem Relation Age of Onset  . Diabetes Other   . Hypertension Other   . Uterine cancer Other     PHYSICAL EXAM: Filed Vitals:   02/16/13 1054  BP: 130/70  Pulse: 72  Weight: 142 lb 12.8 oz (64.774 kg)  SpO2: 99%    General:  Well appearing. No respiratory difficulty HEENT: normal Neck: supple. JVP 5-6 Carotids 2+ bilat; no bruits. No lymphadenopathy or thryomegaly appreciated. Cor: PMI nondisplaced. Regular rate & rhythm. No rubs,  Murmurs.  Lungs: clear Abdomen: soft, nontender, nondistended. No hepatosplenomegaly. No bruits or masses. Good bowel sounds. Extremities: no cyanosis, clubbing, rash, edema.   Neuro: alert & oriented x 3, cranial nerves grossly intact. moves all 4 extremities w/o difficulty. Affect pleasant.   ASSESSMENT & PLAN:

## 2013-03-18 ENCOUNTER — Ambulatory Visit (HOSPITAL_COMMUNITY)
Admission: RE | Admit: 2013-03-18 | Discharge: 2013-03-18 | Disposition: A | Discharge status: Home / Self Care | Payer: Medicare Other | Source: Ambulatory Visit | Attending: Internal Medicine | Admitting: Internal Medicine

## 2013-03-18 ENCOUNTER — Ambulatory Visit (HOSPITAL_BASED_OUTPATIENT_CLINIC_OR_DEPARTMENT_OTHER)
Admission: RE | Admit: 2013-03-18 | Discharge: 2013-03-18 | Disposition: A | Discharge status: Home / Self Care | Payer: Medicare Other | Source: Ambulatory Visit | Attending: Cardiology | Admitting: Cardiology

## 2013-03-18 ENCOUNTER — Encounter (HOSPITAL_COMMUNITY): Payer: Self-pay

## 2013-03-18 VITALS — BP 124/70 | HR 72 | Wt 143.0 lb

## 2013-03-18 DIAGNOSIS — I2510 Atherosclerotic heart disease of native coronary artery without angina pectoris: Secondary | ICD-10-CM | POA: Diagnosis not present

## 2013-03-18 DIAGNOSIS — I509 Heart failure, unspecified: Secondary | ICD-10-CM | POA: Insufficient documentation

## 2013-03-18 DIAGNOSIS — I5040 Unspecified combined systolic (congestive) and diastolic (congestive) heart failure: Secondary | ICD-10-CM | POA: Diagnosis not present

## 2013-03-18 DIAGNOSIS — E119 Type 2 diabetes mellitus without complications: Secondary | ICD-10-CM | POA: Diagnosis not present

## 2013-03-18 DIAGNOSIS — I059 Rheumatic mitral valve disease, unspecified: Secondary | ICD-10-CM | POA: Diagnosis not present

## 2013-03-18 DIAGNOSIS — I447 Left bundle-branch block, unspecified: Secondary | ICD-10-CM

## 2013-03-18 LAB — BASIC METABOLIC PANEL
BUN: 10 mg/dL (ref 6–23)
Chloride: 103 mEq/L (ref 96–112)
Glucose, Bld: 252 mg/dL — ABNORMAL HIGH (ref 70–99)
Potassium: 4.7 mEq/L (ref 3.5–5.1)

## 2013-03-18 MED ORDER — FUROSEMIDE 20 MG PO TABS: 20.0000 mg | ORAL_TABLET | Freq: Every day | ORAL | Status: DC

## 2013-03-18 MED ORDER — CARVEDILOL 12.5 MG PO TABS: 12.5000 mg | ORAL_TABLET | Freq: Two times a day (BID) | ORAL | Status: DC

## 2013-03-18 MED ORDER — LISINOPRIL 2.5 MG PO TABS: 2.5000 mg | ORAL_TABLET | Freq: Every day | ORAL | Status: DC

## 2013-03-18 NOTE — Progress Notes (Signed)
Echocardiogram 2D Echocardiogram has been performed.  Kainon Varady 03/18/2013, 11:41 AM

## 2013-03-18 NOTE — Patient Instructions (Addendum)
Increase carvedilol to 12.5 mg twice a day. Can take 2 tablets of 6.25 mg in the am and pm until you run out.  Refer to EP for ICD consideration.  Follow 2 months.

## 2013-03-20 NOTE — Progress Notes (Signed)
Patient ID: [name] [name], female   DOB: 29-Mar-1934, 79 y.o.   MRN: [id number] Referring Physician: [place]  Primary Care: [name] [name]  Weight Range: 141 pounds Baseline proBNP: 674 on 07/30/12  HPI: [name] [name] is a 79 y.o. AAF with history of diabetes, hypothyroidism, hypertension and newly diagnosed mixed diastolic and systolic heart failure during hospitalization on 07/30/12, EF 20-25%.  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   She was evaluated by her PCP 12/13 for progressive dyspnea.  EKG showed LBBB therefore she was sent to the ER and placed on BiPap.  She had an elevated d-dimer but CTA chest neg for PE.  She was admitted for respiratory failure and found to have depressed LVEF of 20-25%.  She was diuresed and stay was c/b hypotension with acute renal failure requiring short coarse of dopamine.  Discharge Cr 0.77 (peaked 3.11).  Discharge weight 141 pounds.    Underwent Myoview 08/17/12: Overall Impression: High risk stress nuclear study. No ischemia or scar seen. Normal apical thinning is present. Severe LV systolic dysfunction. LV Ejection Fraction: 27%. LV Wall Motion: Severe global hypokinesis.  L & RHC 09/01/12 RA = 2  RV = 37/2/4  PA = 39/12 (22)  PCW = 11  Fick cardiac output/index = 3.0/1.8  Thermo CO/CI = 2.7/1.6  PVR = 4.9 [place] (Fick)  SVR = 2259  FA sat = 98%  PA sat = 57%, 59%  Ao Pressure: 135/57 (88)  LV Pressure: 136/15/20  There was no signficant gradient across the aortic valve on pullback.  Left main: Normal  LAD: Large vessel wraps apex. Gives off multiple small diagonals. 40% lesion in mid LAD. Mild plaque in apical LAD.  LCX: Nondominant. Mild to moderate non-obstructive plaque in mid vessel. Small OM-1 which is OK. Large OM-2 with tandem 90% lesions in very proximal segment. Large OM-3 with long 70-80 lesion in proximal segment.  RCA: Dominant. Proximal and mid vessel heavily calcified with diffuse 40% lesion.   LV-gram done in the RAO projection: Ejection fraction = 25-30%. Global HK. Mild MR.  CAD treated medically as she was not having CP and LV dysfunction felt to be out of proportion to CAD.  Follow up: Had ECHO today which I reviewed personally. EF 25-30% with dyssynchrony.. Feeling pretty good. 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  Able to do all activities without CP or dyspnea however says her legs get very fatigued with walking and she hast to stop. Denies edema or orthopnea. Weight at home 140-142 lbs. Compliant with medications.   Review of Systems: All pertinent positives and negatives as in HPI, otherwise negative.   Past Medical History  Diagnosis Date  . Hypothyroidism   . High cholesterol   . Osteoarthritis   . Diabetes mellitus   . CHF (congestive heart failure)     Current Outpatient Prescriptions  Medication Sig Dispense Refill  . aspirin EC 81 MG EC tablet Take 1 tablet (81 mg total) by mouth daily.  30 tablet  0  . carvedilol (COREG) 12.5 MG tablet Take 1 tablet (12.5 mg total) by mouth 2 (two) times daily with a meal.  180 tablet  6  . ergocalciferol (VITAMIN D2) 50000 UNITS capsule Take 50,000 Units by mouth once a week.      . furosemide (LASIX) 20 MG tablet Take 1 tablet (20 mg total) by mouth daily.  90 tablet  3  .  glucose blood test strip 1 each by Other route as needed. Test twice daily.  Please dispense a 3 month supply      . Lancets (ACCU-CHEK MULTICLIX) lancets 1 each by Other route daily. Use as instructed       . lisinopril (PRINIVIL,ZESTRIL) 2.5 MG tablet Take 1 tablet (2.5 mg total) by mouth daily.  90 tablet  3  . Pitavastatin Calcium 4 MG TABS Take 1 tablet by mouth daily. For cholesterol.      . sitaGLIPtan-metformin (JANUMET) 50-1000 MG per tablet Take 1 tablet by mouth 2 (two) times daily with a meal.      . SYNTHROID 100 MCG tablet TAKE 1 TABLET DAILY  90 tablet  0   No current facility-administered medications for this  encounter.    Allergies  Allergen Reactions  . Statins    PHYSICAL EXAM: Filed Vitals:   03/18/13 1208  BP: 124/70  Pulse: 72  Weight: 143 lb (64.864 kg)  SpO2: 97%   General:  Well appearing. No respiratory difficulty HEENT: normal Neck: supple. JVP 5-6 Carotids 2+ bilat; no bruits. No lymphadenopathy or thryomegaly appreciated. Cor: PMI nondisplaced. Regular rate & rhythm. No rubs,  Murmurs.  Lungs: clear Abdomen: soft, nontender, nondistended. No hepatosplenomegaly. No bruits or masses. Good bowel sounds. Extremities: no cyanosis, clubbing, rash, edema.   Neuro: alert & oriented x 3, cranial nerves grossly intact. moves all 4 extremities w/o difficulty. Affect pleasant.  ASSESSMENT & PLAN:  1) Chronic systolic HF, EF 25% --she is doing fairly well despite severe, persistent LV dysfunction. Suspect exertional leg fatigue is related to HF - NYHA II-III (no clear claudication) --Volume status good. Reinforced need for daily weights and reviewed use of sliding scale diuretics. - Will increase carvedilol to 12.5 mg BID discussed calling if increased fatigue or dizziness - EF remains less than 35%, will refer to Dr. Johney Frame for consideration of CRT-D device ([employer]tr[employer] if possible to utilize Optivol in Clinic)  2) CAD - Continue ASA, not on statin d/t previous intolerance - no signs of ischemia  3) LBBB, chronic  Daniel Bensimhon,MD 5:42 PM

## 2013-04-07 ENCOUNTER — Ambulatory Visit: Payer: Medicare Other | Admitting: Internal Medicine

## 2013-04-08 ENCOUNTER — Encounter: Payer: Self-pay | Admitting: Internal Medicine

## 2013-04-08 ENCOUNTER — Ambulatory Visit (INDEPENDENT_AMBULATORY_CARE_PROVIDER_SITE_OTHER): Payer: Medicare Other

## 2013-04-08 ENCOUNTER — Ambulatory Visit (INDEPENDENT_AMBULATORY_CARE_PROVIDER_SITE_OTHER): Payer: Medicare Other | Admitting: Internal Medicine

## 2013-04-08 VITALS — BP 128/70 | HR 71 | Temp 98.5°F | Resp 16 | Wt 141.0 lb

## 2013-04-08 DIAGNOSIS — E1149 Type 2 diabetes mellitus with other diabetic neurological complication: Secondary | ICD-10-CM

## 2013-04-08 DIAGNOSIS — I5040 Unspecified combined systolic (congestive) and diastolic (congestive) heart failure: Secondary | ICD-10-CM

## 2013-04-08 DIAGNOSIS — E039 Hypothyroidism, unspecified: Secondary | ICD-10-CM | POA: Diagnosis not present

## 2013-04-08 DIAGNOSIS — E785 Hyperlipidemia, unspecified: Secondary | ICD-10-CM

## 2013-04-08 DIAGNOSIS — I10 Essential (primary) hypertension: Secondary | ICD-10-CM

## 2013-04-08 LAB — URINALYSIS, ROUTINE W REFLEX MICROSCOPIC
Bilirubin Urine: NEGATIVE
Hgb urine dipstick: NEGATIVE
Ketones, ur: NEGATIVE
Leukocytes, UA: NEGATIVE
Nitrite: NEGATIVE
Specific Gravity, Urine: 1.015 (ref 1.000–1.030)
Total Protein, Urine: NEGATIVE
Urine Glucose: >=1000
Urobilinogen, UA: 0.2 (ref 0.0–1.0)
pH: 6 (ref 5.0–8.0)

## 2013-04-08 LAB — HEMOGLOBIN A1C: Hgb A1c MFr Bld: 12.6 % — ABNORMAL HIGH (ref 4.6–6.5)

## 2013-04-08 MED ORDER — INSULIN GLARGINE 100 UNIT/ML SOLOSTAR PEN: 30.0000 [IU] | PEN_INJECTOR | Freq: Every day | SUBCUTANEOUS | Status: DC

## 2013-04-08 MED ORDER — GLUCOSE BLOOD VI STRP: ORAL_STRIP | Status: DC

## 2013-04-08 MED ORDER — HYDROCODONE-ACETAMINOPHEN 5-325 MG PO TABS: 1.0000 | ORAL_TABLET | Freq: Four times a day (QID) | ORAL | Status: DC | PRN

## 2013-04-08 MED ORDER — ERGOCALCIFEROL 1.25 MG (50000 UT) PO CAPS: 50000.0000 [IU] | ORAL_CAPSULE | ORAL | Status: DC

## 2013-04-08 MED ORDER — DAPAGLIFLOZIN PROPANEDIOL 10 MG PO TABS: 1.0000 | ORAL_TABLET | Freq: Every day | ORAL | Status: DC

## 2013-04-08 MED ORDER — PITAVASTATIN CALCIUM 4 MG PO TABS: 1.0000 | ORAL_TABLET | Freq: Every day | ORAL | Status: DC

## 2013-04-08 NOTE — Patient Instructions (Signed)
Type 2 Diabetes Mellitus, Adult Type 2 diabetes mellitus, often simply referred to as type 2 diabetes, is a long-lasting (chronic) disease. In type 2 diabetes, the pancreas does not make enough insulin (a hormone), the cells are less responsive to the insulin that is made (insulin resistance), or both. Normally, insulin moves sugars from food into the tissue cells. The tissue cells use the sugars for energy. The lack of insulin or the lack of normal response to insulin causes excess sugars to build up in the blood instead of going into the tissue cells. As a result, high blood sugar (hyperglycemia) develops. The effect of high sugar (glucose) levels can cause many complications. Type 2 diabetes was also previously called adult-onset diabetes but it can occur at any age.  RISK FACTORS  A person is predisposed to developing type 2 diabetes if someone in the family has the disease and also has one or more of the following primary risk factors:  Overweight.  An inactive lifestyle.  A history of consistently eating high-calorie foods. Maintaining a normal weight and regular physical activity can reduce the chance of developing type 2 diabetes. SYMPTOMS  A person with type 2 diabetes may not show symptoms initially. The symptoms of type 2 diabetes appear slowly. The symptoms include:  Increased thirst (polydipsia).  Increased urination (polyuria).  Increased urination during the night (nocturia).  Weight loss. This weight loss may be rapid.  Frequent, recurring infections.  Tiredness (fatigue).  Weakness.  Vision changes, such as blurred vision.  Fruity smell to your breath.  Abdominal pain.  Nausea or vomiting.  Cuts or bruises which are slow to heal.  Tingling or numbness in the hands or feet. DIAGNOSIS Type 2 diabetes is frequently not diagnosed until complications of diabetes are present. Type 2 diabetes is diagnosed when symptoms or complications are present and when blood  glucose levels are increased. Your blood glucose level may be checked by one or more of the following blood tests:  A fasting blood glucose test. You will not be allowed to eat for at least 8 hours before a blood sample is taken.  A random blood glucose test. Your blood glucose is checked at any time of the day regardless of when you ate.  A hemoglobin A1c blood glucose test. A hemoglobin A1c test provides information about blood glucose control over the previous 3 months.  An oral glucose tolerance test (OGTT). Your blood glucose is measured after you have not eaten (fasted) for 2 hours and then after you drink a glucose-containing beverage. TREATMENT   You may need to take insulin or diabetes medicine daily to keep blood glucose levels in the desired range.  You will need to match insulin dosing with exercise and healthy food choices. The treatment goal is to maintain the before meal blood sugar (preprandial glucose) level at 70 130 mg/dL. HOME CARE INSTRUCTIONS   Have your hemoglobin A1c level checked twice a year.  Perform daily blood glucose monitoring as directed by your caregiver.  Monitor urine ketones when you are ill and as directed by your caregiver.  Take your diabetes medicine or insulin as directed by your caregiver to maintain your blood glucose levels in the desired range.  Never run out of diabetes medicine or insulin. It is needed every day.  Adjust insulin based on your intake of carbohydrates. Carbohydrates can raise blood glucose levels but need to be included in your diet. Carbohydrates provide vitamins, minerals, and fiber which are an essential part of   a healthy diet. Carbohydrates are found in fruits, vegetables, whole grains, dairy products, legumes, and foods containing added sugars.    Eat healthy foods. Alternate 3 meals with 3 snacks.  Lose weight if overweight.  Carry a medical alert card or wear your medical alert jewelry.  Carry a 15 gram  carbohydrate snack with you at all times to treat low blood glucose (hypoglycemia). Some examples of 15 gram carbohydrate snacks include:  Glucose tablets, 3 or 4   Glucose gel, 15 gram tube  Raisins, 2 tablespoons (24 grams)  Jelly beans, 6  Animal crackers, 8  Regular pop, 4 ounces (120 mL)  Gummy treats, 9  Recognize hypoglycemia. Hypoglycemia occurs with blood glucose levels of 70 mg/dL and below. The risk for hypoglycemia increases when fasting or skipping meals, during or after intense exercise, and during sleep. Hypoglycemia symptoms can include:  Tremors or shakes.  Decreased ability to concentrate.  Sweating.  Increased heart rate.  Headache.  Dry mouth.  Hunger.  Irritability.  Anxiety.  Restless sleep.  Altered speech or coordination.  Confusion.  Treat hypoglycemia promptly. If you are alert and able to safely swallow, follow the 15:15 rule:  Take 15 20 grams of rapid-acting glucose or carbohydrate. Rapid-acting options include glucose gel, glucose tablets, or 4 ounces (120 mL) of fruit juice, regular soda, or low fat milk.  Check your blood glucose level 15 minutes after taking the glucose.  Take 15 20 grams more of glucose if the repeat blood glucose level is still 70 mg/dL or below.  Eat a meal or snack within 1 hour once blood glucose levels return to normal.    Be alert to polyuria and polydipsia which are early signs of hyperglycemia. An early awareness of hyperglycemia allows for prompt treatment. Treat hyperglycemia as directed by your caregiver.  Engage in at least 150 minutes of moderate-intensity physical activity a week, spread over at least 3 days of the week or as directed by your caregiver. In addition, you should engage in resistance exercise at least 2 times a week or as directed by your caregiver.  Adjust your medicine and food intake as needed if you start a new exercise or sport.  Follow your sick day plan at any time you  are unable to eat or drink as usual.  Avoid tobacco use.  Limit alcohol intake to no more than 1 drink per day for nonpregnant women and 2 drinks per day for men. You should drink alcohol only when you are also eating food. Talk with your caregiver whether alcohol is safe for you. Tell your caregiver if you drink alcohol several times a week.  Follow up with your caregiver regularly.  Schedule an eye exam soon after the diagnosis of type 2 diabetes and then annually.  Perform daily skin and foot care. Examine your skin and feet daily for cuts, bruises, redness, nail problems, bleeding, blisters, or sores. A foot exam by a caregiver should be done annually.  Brush your teeth and gums at least twice a day and floss at least once a day. Follow up with your dentist regularly.  Share your diabetes management plan with your workplace or school.  Stay up-to-date with immunizations.  Learn to manage stress.  Obtain ongoing diabetes education and support as needed.  Participate in, or seek rehabilitation as needed to maintain or improve independence and quality of life. Request a physical or occupational therapy referral if you are having foot or hand numbness or difficulties with grooming,   dressing, eating, or physical activity. SEEK MEDICAL CARE IF:   You are unable to eat food or drink fluids for more than 6 hours.  You have nausea and vomiting for more than 6 hours.  Your blood glucose level is over 240 mg/dL.  There is a change in mental status.  You develop an additional serious illness.  You have diarrhea for more than 6 hours.  You have been sick or have had a fever for a couple of days and are not getting better.  You have pain during any physical activity.  SEEK IMMEDIATE MEDICAL CARE IF:  You have difficulty breathing.  You have moderate to large ketone levels. MAKE SURE YOU:  Understand these instructions.  Will watch your condition.  Will get help right away if  you are not doing well or get worse. Document Released: 07/29/2005 Document Revised: 04/22/2012 Document Reviewed: 02/25/2012 [employer] Patient Information 2014 [employer].  

## 2013-04-08 NOTE — Progress Notes (Signed)
Subjective:    Patient ID: [name] [name], female    DOB: Aug 12, 1934, 79 y.o.   MRN: [id number]  Diabetes She presents for her follow-up diabetic visit. She has type 2 diabetes mellitus. Her disease course has been worsening. There are no hypoglycemic associated symptoms. Pertinent negatives for hypoglycemia include no dizziness or tremors. Associated symptoms include fatigue, polydipsia, polyphagia and polyuria. Pertinent negatives for diabetes include no blurred vision, no chest pain, no foot paresthesias, no foot ulcerations, no visual change, no weakness and no weight loss. There are no hypoglycemic complications. Diabetic complications include heart disease and peripheral neuropathy. Current diabetic treatment includes intensive insulin program. She is compliant with treatment some of the time. Her weight is stable. She is following a generally healthy diet. Meal planning includes avoidance of concentrated sweets. She has not had a previous visit with a dietician. She participates in exercise intermittently. Her breakfast blood glucose range is generally >200 mg/dl. Her lunch blood glucose range is generally >200 mg/dl. Her dinner blood glucose range is generally >200 mg/dl. Her highest blood glucose is >200 mg/dl. Her overall blood glucose range is >200 mg/dl. She does not see a podiatrist.Eye exam is current.      Review of Systems  Constitutional: Positive for fatigue. Negative for fever, chills, weight loss, diaphoresis and appetite change.  HENT: Negative.   Eyes: Negative.  Negative for blurred vision.  Respiratory: Negative.  Negative for cough, chest tightness, shortness of breath, wheezing and stridor.   Cardiovascular: Negative.  Negative for chest pain, palpitations and leg swelling.  Gastrointestinal: Negative.  Negative for nausea, vomiting, abdominal pain, diarrhea and constipation.  Endocrine: Positive for polydipsia, polyphagia and polyuria.  Genitourinary: Negative for  dysuria, urgency, frequency, hematuria, decreased urine volume and dyspareunia.  Musculoskeletal: Negative.  Negative for myalgias and back pain.  Skin: Negative.   Allergic/Immunologic: Negative.   Neurological: Negative.  Negative for dizziness, tremors, syncope, weakness and light-headedness.  Hematological: Negative.  Negative for adenopathy. Does not bruise/bleed easily.  Psychiatric/Behavioral: Negative.        Objective:   Physical Exam  Vitals reviewed. Constitutional: She is oriented to person, place, and time. She appears well-developed and well-nourished. No distress.  HENT:  Head: Normocephalic and atraumatic.  Mouth/Throat: Oropharynx is clear and moist. No oropharyngeal exudate.  Eyes: Conjunctivae are normal. Right eye exhibits no discharge. Left eye exhibits no discharge. No scleral icterus.  Neck: Neck supple. No JVD present. No tracheal deviation present. No thyromegaly present.  Cardiovascular: Normal rate, regular rhythm, normal heart sounds and intact distal pulses.  Exam reveals no gallop and no friction rub.   No murmur heard. Pulmonary/Chest: Effort normal and breath sounds normal. No stridor. No respiratory distress. She has no wheezes. She has no rales. She exhibits no tenderness.  Abdominal: Soft. Bowel sounds are normal. She exhibits no distension. There is no tenderness. There is no rebound and no guarding.  Musculoskeletal: Normal range of motion. She exhibits no edema and no tenderness.  Lymphadenopathy:    She has no cervical adenopathy.  Neurological: She is oriented to person, place, and time.  Skin: Skin is warm and dry. No rash noted. She is not diaphoretic. No erythema. No pallor.  Psychiatric: She has a normal mood and affect. Her behavior is normal. Judgment and thought content normal.      Lab Results  Component Value Date   WBC 5.6 08/27/2012   HGB 12.6 08/27/2012   HCT 38.1 08/27/2012   PLT 201.0 08/27/2012  GLUCOSE 252* 03/18/2013   CHOL  153 09/25/2011   TRIG 101.0 09/25/2011   HDL 44.90 09/25/2011   LDLDIRECT 153.9 01/06/2007   LDLCALC 88 09/25/2011   ALT 16 08/27/2012   AST 18 08/27/2012   NA 137 03/18/2013   K 4.7 03/18/2013   CL 103 03/18/2013   CREATININE 0.81 03/18/2013   BUN 10 03/18/2013   CO2 23 03/18/2013   TSH 5.75* 08/27/2012   HGBA1C 8.6* 08/27/2012   MICROALBUR 0.8 04/18/2011      Assessment & Plan:

## 2013-04-09 LAB — BASIC METABOLIC PANEL
CO2: 26 mEq/L (ref 19–32)
Calcium: 10 mg/dL (ref 8.4–10.5)
Chloride: 100 mEq/L (ref 96–112)
Creatinine, Ser: 0.9 mg/dL (ref 0.4–1.2)
Sodium: 134 mEq/L — ABNORMAL LOW (ref 135–145)

## 2013-04-09 LAB — LIPID PANEL
Cholesterol: 187 mg/dL (ref 0–200)
Total CHOL/HDL Ratio: 5
Triglycerides: 362 mg/dL — ABNORMAL HIGH (ref 0.0–149.0)
VLDL: 72.4 mg/dL — ABNORMAL HIGH (ref 0.0–40.0)

## 2013-04-12 ENCOUNTER — Encounter: Payer: Self-pay | Admitting: Internal Medicine

## 2013-04-12 NOTE — Assessment & Plan Note (Signed)
She has no s/s today and a normal volume status

## 2013-04-12 NOTE — Assessment & Plan Note (Signed)
She is doing well on livalo 

## 2013-04-12 NOTE — Assessment & Plan Note (Signed)
Her BP is well controlled 

## 2013-04-12 NOTE — Assessment & Plan Note (Signed)
Her A1C is way too high, I have asked her to start farxiga and to see a diabetic educator to address her insulin dose

## 2013-04-12 NOTE — Assessment & Plan Note (Signed)
I will check her TSH and will adjust her dose if needed 

## 2013-04-13 ENCOUNTER — Telehealth: Payer: Self-pay

## 2013-04-13 ENCOUNTER — Other Ambulatory Visit: Payer: Self-pay | Admitting: Internal Medicine

## 2013-04-13 DIAGNOSIS — E1149 Type 2 diabetes mellitus with other diabetic neurological complication: Secondary | ICD-10-CM

## 2013-04-13 DIAGNOSIS — E785 Hyperlipidemia, unspecified: Secondary | ICD-10-CM

## 2013-04-13 MED ORDER — ATORVASTATIN CALCIUM 40 MG PO TABS: 40.0000 mg | ORAL_TABLET | Freq: Every day | ORAL | Status: DC

## 2013-04-13 NOTE — Telephone Encounter (Signed)
Received fax from [facility] advising livalo not covered. Covered alternatives are atorvastatin, lovastatin, pravastatin, or simvastatin.

## 2013-04-15 ENCOUNTER — Telehealth: Payer: Self-pay | Admitting: *Deleted

## 2013-04-15 NOTE — Telephone Encounter (Signed)
Spoke with pt advised of MDs message 

## 2013-04-15 NOTE — Telephone Encounter (Signed)
Pt called requesting cholesterol medication be changed to Crestor instead of statins due to allergy to statins.  Please advise

## 2013-04-15 NOTE — Telephone Encounter (Signed)
CRESTOR IS A STATIN

## 2013-04-21 ENCOUNTER — Telehealth: Payer: Self-pay | Admitting: *Deleted

## 2013-04-22 ENCOUNTER — Institutional Professional Consult (permissible substitution): Payer: Medicare Other | Admitting: Internal Medicine

## 2013-04-23 NOTE — Telephone Encounter (Signed)
A user error has taken place.

## 2013-05-14 ENCOUNTER — Ambulatory Visit (INDEPENDENT_AMBULATORY_CARE_PROVIDER_SITE_OTHER): Payer: Medicare Other | Admitting: Internal Medicine

## 2013-05-14 ENCOUNTER — Encounter: Payer: Self-pay | Admitting: Internal Medicine

## 2013-05-14 VITALS — BP 120/76 | HR 66 | Temp 98.1°F | Resp 16 | Ht 64.0 in | Wt 142.1 lb

## 2013-05-14 DIAGNOSIS — Z23 Encounter for immunization: Secondary | ICD-10-CM | POA: Diagnosis not present

## 2013-05-14 DIAGNOSIS — I5040 Unspecified combined systolic (congestive) and diastolic (congestive) heart failure: Secondary | ICD-10-CM | POA: Diagnosis not present

## 2013-05-14 DIAGNOSIS — E1149 Type 2 diabetes mellitus with other diabetic neurological complication: Secondary | ICD-10-CM | POA: Diagnosis not present

## 2013-05-14 DIAGNOSIS — E039 Hypothyroidism, unspecified: Secondary | ICD-10-CM | POA: Diagnosis not present

## 2013-05-14 MED ORDER — METFORMIN HCL ER 750 MG PO TB24: 750.0000 mg | ORAL_TABLET | Freq: Every day | ORAL | Status: DC

## 2013-05-14 MED ORDER — GLIPIZIDE 10 MG PO TABS: 10.0000 mg | ORAL_TABLET | Freq: Two times a day (BID) | ORAL | Status: DC

## 2013-05-14 NOTE — Patient Instructions (Signed)
Type 2 Diabetes Mellitus, Adult Type 2 diabetes mellitus, often simply referred to as type 2 diabetes, is a long-lasting (chronic) disease. In type 2 diabetes, the pancreas does not make enough insulin (a hormone), the cells are less responsive to the insulin that is made (insulin resistance), or both. Normally, insulin moves sugars from food into the tissue cells. The tissue cells use the sugars for energy. The lack of insulin or the lack of normal response to insulin causes excess sugars to build up in the blood instead of going into the tissue cells. As a result, high blood sugar (hyperglycemia) develops. The effect of high sugar (glucose) levels can cause many complications. Type 2 diabetes was also previously called adult-onset diabetes but it can occur at any age.  RISK FACTORS  A person is predisposed to developing type 2 diabetes if someone in the family has the disease and also has one or more of the following primary risk factors:  Overweight.  An inactive lifestyle.  A history of consistently eating high-calorie foods. Maintaining a normal weight and regular physical activity can reduce the chance of developing type 2 diabetes. SYMPTOMS  A person with type 2 diabetes may not show symptoms initially. The symptoms of type 2 diabetes appear slowly. The symptoms include:  Increased thirst (polydipsia).  Increased urination (polyuria).  Increased urination during the night (nocturia).  Weight loss. This weight loss may be rapid.  Frequent, recurring infections.  Tiredness (fatigue).  Weakness.  Vision changes, such as blurred vision.  Fruity smell to your breath.  Abdominal pain.  Nausea or vomiting.  Cuts or bruises which are slow to heal.  Tingling or numbness in the hands or feet. DIAGNOSIS Type 2 diabetes is frequently not diagnosed until complications of diabetes are present. Type 2 diabetes is diagnosed when symptoms or complications are present and when blood  glucose levels are increased. Your blood glucose level may be checked by one or more of the following blood tests:  A fasting blood glucose test. You will not be allowed to eat for at least 8 hours before a blood sample is taken.  A random blood glucose test. Your blood glucose is checked at any time of the day regardless of when you ate.  A hemoglobin A1c blood glucose test. A hemoglobin A1c test provides information about blood glucose control over the previous 3 months.  An oral glucose tolerance test (OGTT). Your blood glucose is measured after you have not eaten (fasted) for 2 hours and then after you drink a glucose-containing beverage. TREATMENT   You may need to take insulin or diabetes medicine daily to keep blood glucose levels in the desired range.  You will need to match insulin dosing with exercise and healthy food choices. The treatment goal is to maintain the before meal blood sugar (preprandial glucose) level at 70 130 mg/dL. HOME CARE INSTRUCTIONS   Have your hemoglobin A1c level checked twice a year.  Perform daily blood glucose monitoring as directed by your caregiver.  Monitor urine ketones when you are ill and as directed by your caregiver.  Take your diabetes medicine or insulin as directed by your caregiver to maintain your blood glucose levels in the desired range.  Never run out of diabetes medicine or insulin. It is needed every day.  Adjust insulin based on your intake of carbohydrates. Carbohydrates can raise blood glucose levels but need to be included in your diet. Carbohydrates provide vitamins, minerals, and fiber which are an essential part of   a healthy diet. Carbohydrates are found in fruits, vegetables, whole grains, dairy products, legumes, and foods containing added sugars.    Eat healthy foods. Alternate 3 meals with 3 snacks.  Lose weight if overweight.  Carry a medical alert card or wear your medical alert jewelry.  Carry a 15 gram  carbohydrate snack with you at all times to treat low blood glucose (hypoglycemia). Some examples of 15 gram carbohydrate snacks include:  Glucose tablets, 3 or 4   Glucose gel, 15 gram tube  Raisins, 2 tablespoons (24 grams)  Jelly beans, 6  Animal crackers, 8  Regular pop, 4 ounces (120 mL)  Gummy treats, 9  Recognize hypoglycemia. Hypoglycemia occurs with blood glucose levels of 70 mg/dL and below. The risk for hypoglycemia increases when fasting or skipping meals, during or after intense exercise, and during sleep. Hypoglycemia symptoms can include:  Tremors or shakes.  Decreased ability to concentrate.  Sweating.  Increased heart rate.  Headache.  Dry mouth.  Hunger.  Irritability.  Anxiety.  Restless sleep.  Altered speech or coordination.  Confusion.  Treat hypoglycemia promptly. If you are alert and able to safely swallow, follow the 15:15 rule:  Take 15 20 grams of rapid-acting glucose or carbohydrate. Rapid-acting options include glucose gel, glucose tablets, or 4 ounces (120 mL) of fruit juice, regular soda, or low fat milk.  Check your blood glucose level 15 minutes after taking the glucose.  Take 15 20 grams more of glucose if the repeat blood glucose level is still 70 mg/dL or below.  Eat a meal or snack within 1 hour once blood glucose levels return to normal.    Be alert to polyuria and polydipsia which are early signs of hyperglycemia. An early awareness of hyperglycemia allows for prompt treatment. Treat hyperglycemia as directed by your caregiver.  Engage in at least 150 minutes of moderate-intensity physical activity a week, spread over at least 3 days of the week or as directed by your caregiver. In addition, you should engage in resistance exercise at least 2 times a week or as directed by your caregiver.  Adjust your medicine and food intake as needed if you start a new exercise or sport.  Follow your sick day plan at any time you  are unable to eat or drink as usual.  Avoid tobacco use.  Limit alcohol intake to no more than 1 drink per day for nonpregnant women and 2 drinks per day for men. You should drink alcohol only when you are also eating food. Talk with your caregiver whether alcohol is safe for you. Tell your caregiver if you drink alcohol several times a week.  Follow up with your caregiver regularly.  Schedule an eye exam soon after the diagnosis of type 2 diabetes and then annually.  Perform daily skin and foot care. Examine your skin and feet daily for cuts, bruises, redness, nail problems, bleeding, blisters, or sores. A foot exam by a caregiver should be done annually.  Brush your teeth and gums at least twice a day and floss at least once a day. Follow up with your dentist regularly.  Share your diabetes management plan with your workplace or school.  Stay up-to-date with immunizations.  Learn to manage stress.  Obtain ongoing diabetes education and support as needed.  Participate in, or seek rehabilitation as needed to maintain or improve independence and quality of life. Request a physical or occupational therapy referral if you are having foot or hand numbness or difficulties with grooming,   dressing, eating, or physical activity. SEEK MEDICAL CARE IF:   You are unable to eat food or drink fluids for more than 6 hours.  You have nausea and vomiting for more than 6 hours.  Your blood glucose level is over 240 mg/dL.  There is a change in mental status.  You develop an additional serious illness.  You have diarrhea for more than 6 hours.  You have been sick or have had a fever for a couple of days and are not getting better.  You have pain during any physical activity.  SEEK IMMEDIATE MEDICAL CARE IF:  You have difficulty breathing.  You have moderate to large ketone levels. MAKE SURE YOU:  Understand these instructions.  Will watch your condition.  Will get help right away if  you are not doing well or get worse. Document Released: 07/29/2005 Document Revised: 04/22/2012 Document Reviewed: 02/25/2012 [employer] Patient Information 2014 [employer].  

## 2013-05-14 NOTE — Assessment & Plan Note (Signed)
meds were changed at her request Will see how this works out She is not willing to inject insulin at this time

## 2013-05-14 NOTE — Assessment & Plan Note (Signed)
Last TSH was normal

## 2013-05-14 NOTE — Assessment & Plan Note (Signed)
She has a normal volume status today 

## 2013-05-14 NOTE — Progress Notes (Signed)
Subjective:    Patient ID: [name] [name], female    DOB: July 11, 1934, 79 y.o.   MRN: [id number]  Diabetes She presents for her follow-up diabetic visit. She has type 2 diabetes mellitus. Her disease course has been stable. There are no hypoglycemic associated symptoms. Pertinent negatives for hypoglycemia include no dizziness or tremors. Pertinent negatives for diabetes include no blurred vision, no chest pain, no fatigue, no foot paresthesias, no foot ulcerations, no polydipsia, no polyphagia, no polyuria, no visual change, no weakness and no weight loss. There are no hypoglycemic complications. There are no diabetic complications. When asked about current treatments, none (she "does not like" her current regimen and wants to change back to an SU and metformin) were reported. She is compliant with treatment none of the time. Her weight is stable. She is following a generally healthy diet. Meal planning includes avoidance of concentrated sweets. She participates in exercise intermittently. There is no change in her home blood glucose trend. An ACE inhibitor/angiotensin II receptor blocker is being taken. She does not see a podiatrist.Eye exam is current.      Review of Systems  Constitutional: Negative.  Negative for fever, chills, weight loss, diaphoresis, appetite change and fatigue.  HENT: Negative.   Eyes: Negative.  Negative for blurred vision.  Respiratory: Negative.  Negative for cough, chest tightness, shortness of breath, wheezing and stridor.   Cardiovascular: Negative.  Negative for chest pain, palpitations and leg swelling.  Gastrointestinal: Negative.  Negative for nausea, vomiting, abdominal pain, diarrhea, constipation and blood in stool.  Endocrine: Negative.  Negative for polydipsia, polyphagia and polyuria.  Genitourinary: Negative.   Musculoskeletal: Negative.   Skin: Negative.   Allergic/Immunologic: Negative.   Neurological: Negative.  Negative for dizziness, tremors,  weakness, light-headedness and numbness.  Hematological: Negative.  Negative for adenopathy. Does not bruise/bleed easily.  Psychiatric/Behavioral: Negative.        Objective:   Physical Exam  Vitals reviewed. Constitutional: She is oriented to person, place, and time. She appears well-developed and well-nourished. No distress.  HENT:  Head: Normocephalic and atraumatic.  Mouth/Throat: Oropharynx is clear and moist. No oropharyngeal exudate.  Eyes: Conjunctivae are normal. Right eye exhibits no discharge. Left eye exhibits no discharge. No scleral icterus.  Neck: Normal range of motion. Neck supple. No JVD present. No tracheal deviation present. No thyromegaly present.  Cardiovascular: Normal rate, regular rhythm, S1 normal, S2 normal, normal heart sounds and intact distal pulses.  Exam reveals no gallop, no S3, no S4, no distant heart sounds and no friction rub.   No murmur heard. Pulses:      Carotid pulses are 1+ on the right side, and 1+ on the left side.      Radial pulses are 1+ on the right side, and 1+ on the left side.       Femoral pulses are 1+ on the right side, and 1+ on the left side.      Popliteal pulses are 1+ on the right side, and 1+ on the left side.       Dorsalis pedis pulses are 1+ on the right side, and 1+ on the left side.       Posterior tibial pulses are 1+ on the right side, and 1+ on the left side.  Fixed Split S2  Pulmonary/Chest: Effort normal and breath sounds normal. No stridor. No respiratory distress. She has no wheezes. She has no rales. She exhibits no tenderness.  Abdominal: Soft. Bowel sounds are normal. She exhibits no  distension and no mass. There is no tenderness. There is no rebound and no guarding.  Musculoskeletal: Normal range of motion. She exhibits no edema and no tenderness.  Lymphadenopathy:    She has no cervical adenopathy.  Neurological: She is oriented to person, place, and time.  Skin: Skin is warm and dry. No rash noted. She is  not diaphoretic. No erythema. No pallor.     Lab Results  Component Value Date   WBC 5.6 08/27/2012   HGB 12.6 08/27/2012   HCT 38.1 08/27/2012   PLT 201.0 08/27/2012   GLUCOSE 365* 04/08/2013   CHOL 187 04/08/2013   TRIG 362.0* 04/08/2013   HDL 40.70 04/08/2013   LDLDIRECT 101.1 04/08/2013   LDLCALC 88 09/25/2011   ALT 16 08/27/2012   AST 18 08/27/2012   NA 134* 04/08/2013   K 4.1 04/08/2013   CL 100 04/08/2013   CREATININE 0.9 04/08/2013   BUN 14 04/08/2013   CO2 26 04/08/2013   TSH 4.28 04/08/2013   HGBA1C 12.6* 04/08/2013   MICROALBUR 0.8 04/18/2011       Assessment & Plan:

## 2013-06-10 ENCOUNTER — Ambulatory Visit: Payer: Self-pay | Admitting: Podiatry

## 2013-06-22 ENCOUNTER — Ambulatory Visit (HOSPITAL_COMMUNITY)
Admission: RE | Admit: 2013-06-22 | Discharge: 2013-06-22 | Disposition: A | Discharge status: Home / Self Care | Payer: Medicare Other | Source: Ambulatory Visit | Attending: Internal Medicine | Admitting: Internal Medicine

## 2013-06-22 VITALS — BP 154/82 | HR 71 | Wt 143.5 lb

## 2013-06-22 DIAGNOSIS — I10 Essential (primary) hypertension: Secondary | ICD-10-CM | POA: Diagnosis not present

## 2013-06-22 DIAGNOSIS — Z79899 Other long term (current) drug therapy: Secondary | ICD-10-CM | POA: Insufficient documentation

## 2013-06-22 DIAGNOSIS — E785 Hyperlipidemia, unspecified: Secondary | ICD-10-CM | POA: Insufficient documentation

## 2013-06-22 DIAGNOSIS — IMO0001 Reserved for inherently not codable concepts without codable children: Secondary | ICD-10-CM | POA: Diagnosis not present

## 2013-06-22 DIAGNOSIS — I2510 Atherosclerotic heart disease of native coronary artery without angina pectoris: Secondary | ICD-10-CM | POA: Diagnosis not present

## 2013-06-22 DIAGNOSIS — I447 Left bundle-branch block, unspecified: Secondary | ICD-10-CM

## 2013-06-22 DIAGNOSIS — I509 Heart failure, unspecified: Secondary | ICD-10-CM | POA: Diagnosis not present

## 2013-06-22 DIAGNOSIS — I5022 Chronic systolic (congestive) heart failure: Secondary | ICD-10-CM

## 2013-06-22 DIAGNOSIS — E039 Hypothyroidism, unspecified: Secondary | ICD-10-CM | POA: Insufficient documentation

## 2013-06-22 DIAGNOSIS — Z7982 Long term (current) use of aspirin: Secondary | ICD-10-CM | POA: Diagnosis not present

## 2013-06-22 MED ORDER — LISINOPRIL 2.5 MG PO TABS: 2.5000 mg | ORAL_TABLET | Freq: Two times a day (BID) | ORAL | Status: DC

## 2013-06-22 NOTE — Progress Notes (Signed)
Patient ID: [name] [name], female   DOB: 1933-11-18, 79 y.o.   MRN: [id number] Referring Physician: [place]  Primary Care: Dr. Sanda Linger  Weight Range: 141 pounds Baseline proBNP: 674 on 07/30/12  HPI: [name] [name] is a 79 y.o. AAF with history of diabetes, hypothyroidism, hypertension and newly diagnosed mixed diastolic and systolic heart failure during hospitalization on 07/30/12, EF 20-25%.  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   She was evaluated by her PCP 12/13 for progressive dyspnea.  EKG showed LBBB therefore she was sent to the ER and placed on BiPap.  She had an elevated d-dimer but CTA chest neg for PE.  She was admitted for respiratory failure and found to have depressed LVEF of 20-25%.  She was diuresed and stay was c/b hypotension with acute renal failure requiring short coarse of dopamine.  Discharge Cr 0.77 (peaked 3.11).  Discharge weight 141 pounds.    Underwent Myoview 08/17/12: Overall Impression: High risk stress nuclear study. No ischemia or scar seen. Normal apical thinning is present. Severe LV systolic dysfunction. LV Ejection Fraction: 27%. LV Wall Motion: Severe global hypokinesis.  L & RHC 09/01/12 RA = 2  RV = 37/2/4  PA = 39/12 (22)  PCW = 11  Fick cardiac output/index = 3.0/1.8  Thermo CO/CI = 2.7/1.6  PVR = 4.9 Woods (Fick)  SVR = 2259  FA sat = 98%  PA sat = 57%, 59%  Ao Pressure: 135/57 (88)  LV Pressure: 136/15/20  There was no signficant gradient across the aortic valve on pullback.  Left main: Normal  LAD: Large vessel wraps apex. Gives off multiple small diagonals. 40% lesion in mid LAD. Mild plaque in apical LAD.  LCX: Nondominant. Mild to moderate non-obstructive plaque in mid vessel. Small OM-1 which is OK. Large OM-2 with tandem 90% lesions in very proximal segment. Large OM-3 with long 70-80 lesion in proximal segment.  RCA: Dominant. Proximal and mid vessel heavily calcified with diffuse 40% lesion.   LV-gram done in the RAO projection: Ejection fraction = 25-30%. Global HK. Mild MR.  CAD treated medically as she was not having CP and LV dysfunction felt to be out of proportion to CAD.  ECHO -03/18/13  EF 20-25% with dyssynchrony.  She returns for follow up. Last visit carvedilol was increased to 12.5 mg bid. Denies SOB/PND/Orthopnea. She does admit to leg fatigue after she exerts.  She has retired. Weight at home 141-143 pounds. She has not required extra lasix. Complaint with medications but did not take medications this am. SBP at home 120-130. She has follow up with EP 07/01/13.   Labs 04/08/13 K 4.1 Creatinine 0.9 Hemoglobin A1C 12.6 Cholesterol 187 Triglycerides 362 HDL 40    Review of Systems: All pertinent positives and negatives as in HPI, otherwise negative.   Past Medical History  Diagnosis Date  . Hypothyroidism   . High cholesterol   . Osteoarthritis   . Diabetes mellitus   . CHF (congestive heart failure)     Current Outpatient Prescriptions  Medication Sig Dispense Refill  . aspirin EC 81 MG EC tablet Take 1 tablet (81 mg total) by mouth daily.  30 tablet  0  . atorvastatin (LIPITOR) 40 MG tablet Take 1 tablet (40 mg total) by mouth daily.  90 tablet  3  . carvedilol (COREG) 12.5 MG tablet Take 1 tablet (12.5 mg total) by mouth 2 (two) times daily with a meal.  180 tablet  6  . ergocalciferol (VITAMIN D2) 50000 UNITS capsule Take 1 capsule (50,000 Units total) by mouth once a week.  12 capsule  3  . furosemide (LASIX) 20 MG tablet Take 1 tablet (20 mg total) by mouth daily.  90 tablet  3  . glipiZIDE (GLUCOTROL) 10 MG tablet Take 1 tablet (10 mg total) by mouth 2 (two) times daily before a meal.  180 tablet  1  . glucose blood test strip Verio IQ Use TID  100 each  12  . HYDROcodone-acetaminophen (NORCO/VICODIN) 5-325 MG per tablet Take 1 tablet by mouth every 6 (six) hours as needed for pain.  65 tablet  3  . Lancets (ACCU-CHEK MULTICLIX) lancets 1 each by Other  route daily. Use as instructed       . lisinopril (PRINIVIL,ZESTRIL) 2.5 MG tablet Take 1 tablet (2.5 mg total) by mouth daily.  90 tablet  3  . metFORMIN (GLUCOPHAGE XR) 750 MG 24 hr tablet Take 1 tablet (750 mg total) by mouth daily with breakfast.  90 tablet  1  . SYNTHROID 100 MCG tablet TAKE 1 TABLET DAILY  90 tablet  0   No current facility-administered medications for this encounter.    Allergies  Allergen Reactions  . Statins    PHYSICAL EXAM: Filed Vitals:   06/22/13 1118  BP: 154/82  Pulse: 71  Weight: 143 lb 8 oz (65.091 kg)  SpO2: 96%   General:  Well appearing. No respiratory difficulty HEENT: normal Neck: supple. JVP 5-6 Carotids 2+ bilat; no bruits. No lymphadenopathy or thryomegaly appreciated. Cor: PMI nondisplaced. Regular rate & rhythm. No rubs,  Murmurs. Wide Split Lungs: clear Abdomen: soft, nontender, nondistended. No hepatosplenomegaly. No bruits or masses. Good bowel sounds. Extremities: no cyanosis, clubbing, rash, edema.   Neuro: alert & oriented x 3, cranial nerves grossly intact. moves all 4 extremities w/o difficulty. Affect pleasant.  ASSESSMENT & PLAN: 1) Chronic systolic HF, 03/2013 EF 20-25% --NYHA II. she is doing fairly well despite severe, persistent LV dysfunction. Exertional leg fatigue is related to HF NYHA II--Volume status stable. Continue lasix 20 mg daily - Continue carvedilol to 12.5 mg BID.  -Continue lisinopril 2.5 mg in am daily and add 2.5 mg in pm. - Reinforced need for daily weights and reviewed use of sliding scale diuretics. -Check BMET next week.  - F/U with Dr [name] [name] 07/01/13 for possible CRT-D. [employer] if possible to utilize Optivol in Clinic  2) CAD - Continue ASA, not on statin d/t previous intolerance - no signs of ischemia  3) LBBB, chronic  4) Uncontrolled DM Hemoglobin A1C 12.6 Followed by Dr [name] [name].   5) Hyperlipidemia- Per Dr [name] [name]. On lipitor 40 mg daily   Follow up 6-8 weeks    [name],AMY,NP-C 11:28  AM   Patient seen and examined with Tonye Becket, NP. We discussed all aspects of the encounter. I agree with the assessment and plan as stated above.   She is doing well. NYHA II-III. Volume status looks great. However EF remains depressed despite aggressive medical therapy. Agree with referral to EP to evaluate for CRT-D. Stable from CAD perspective. No s/s of ischemia. Continue medical therapy. Stressed need for better control of her DM2. Would continue to push ACE-I as tolerated by BP.   Daniel Bensimhon,MD 11:36 AM

## 2013-06-22 NOTE — Patient Instructions (Signed)
Follow up in 2 months  Take lisinopril 2.5 mg twice a day  Do the following things EVERYDAY: 1) Weigh yourself in the morning before breakfast. Write it down and keep it in a log. 2) Take your medicines as prescribed 3) Eat low salt foods-Limit salt (sodium) to 2000 mg per day.  4) Stay as active as you can everyday 5) Limit all fluids for the day to less than 2 liters

## 2013-06-28 ENCOUNTER — Encounter: Payer: Self-pay | Admitting: Podiatry

## 2013-06-28 ENCOUNTER — Ambulatory Visit (INDEPENDENT_AMBULATORY_CARE_PROVIDER_SITE_OTHER): Payer: Medicare Other | Admitting: Podiatry

## 2013-06-28 VITALS — BP 109/53 | HR 71 | Resp 16

## 2013-06-28 DIAGNOSIS — M79609 Pain in unspecified limb: Secondary | ICD-10-CM

## 2013-06-28 DIAGNOSIS — B351 Tinea unguium: Secondary | ICD-10-CM

## 2013-06-30 NOTE — Progress Notes (Signed)
Subjective:     Patient ID: [name] [name], female   DOB: January 18, 1934, 79 y.o.   MRN: [id number]  HPI patient states my nails are elongated and becoming tender.   Review of Systems     Objective:   Physical Exam Neurovascular status unchanged and nail disease with thickness and pain 1-5 both feet    Assessment:     Mycotic nail infection with pain 1-5 both feet    Plan:     Debridement painful nail bed 1-5 both feet with no bleeding noted

## 2013-07-01 ENCOUNTER — Encounter: Payer: Self-pay | Admitting: Internal Medicine

## 2013-07-01 ENCOUNTER — Telehealth: Payer: Self-pay | Admitting: *Deleted

## 2013-07-01 ENCOUNTER — Ambulatory Visit (INDEPENDENT_AMBULATORY_CARE_PROVIDER_SITE_OTHER): Payer: Medicare Other | Admitting: Internal Medicine

## 2013-07-01 VITALS — BP 130/65 | HR 59 | Ht 65.0 in | Wt 142.2 lb

## 2013-07-01 DIAGNOSIS — I420 Dilated cardiomyopathy: Secondary | ICD-10-CM

## 2013-07-01 DIAGNOSIS — I447 Left bundle-branch block, unspecified: Secondary | ICD-10-CM | POA: Insufficient documentation

## 2013-07-01 DIAGNOSIS — I5022 Chronic systolic (congestive) heart failure: Secondary | ICD-10-CM

## 2013-07-01 DIAGNOSIS — I428 Other cardiomyopathies: Secondary | ICD-10-CM | POA: Diagnosis not present

## 2013-07-01 NOTE — Progress Notes (Signed)
Patient Care Team: [name], MD as PCP - General   HPI  [name] [name] is a 79 y.o. female Seen at the request of the heart failure clinic for consideration of CRT D. implantation.  She has a history of left ventricular dysfunction diagnosed about a year ago. She underwent Myoview scanning which was high risk and subsequent catheterization which demonstrated tandem lesions in her OM which was elected to be treated medically. LV function was 25% range and has received care despite guidelines directed medical care     She is class IIb-3 symptoms. She is fatigued walking up a flight of stairs. She is struggles to bring groceries in from the car. This is manifested mostly by weakness in her legs. She has not had syncope or tachycardia palpitations. She does not have orthopnea or nocturnal dyspnea and no peripheral edema.   Past Medical History  Diagnosis Date  . Hypothyroidism   . High cholesterol   . Osteoarthritis   . Diabetes mellitus   . CHF (congestive heart failure)   . Congestive dilated cardiomyopathy 08/02/2012  . Left bundle branch block 07/01/2013    Past Surgical History  Procedure Laterality Date  . Abdominal hysterectomy  1980  . Bmd  2006    Current Outpatient Prescriptions  Medication Sig Dispense Refill  . aspirin EC 81 MG EC tablet Take 1 tablet (81 mg total) by mouth daily.  30 tablet  0  . atorvastatin (LIPITOR) 40 MG tablet Take 1 tablet (40 mg total) by mouth daily.  90 tablet  3  . carvedilol (COREG) 12.5 MG tablet Take 1 tablet (12.5 mg total) by mouth 2 (two) times daily with a meal.  180 tablet  6  . ergocalciferol (VITAMIN D2) 50000 UNITS capsule Take 1 capsule (50,000 Units total) by mouth once a week.  12 capsule  3  . furosemide (LASIX) 20 MG tablet Take 1 tablet (20 mg total) by mouth daily.  90 tablet  3  . glipiZIDE (GLUCOTROL) 10 MG tablet Take 1 tablet (10 mg total) by mouth 2 (two) times daily before a meal.  180 tablet  1  .  glucose blood test strip Verio IQ Use TID  100 each  12  . HYDROcodone-acetaminophen (NORCO/VICODIN) 5-325 MG per tablet Take 1 tablet by mouth every 6 (six) hours as needed for pain.  65 tablet  3  . Lancets (ACCU-CHEK MULTICLIX) lancets 1 each by Other route daily. Use as instructed       . lisinopril (PRINIVIL,ZESTRIL) 2.5 MG tablet Take 1 tablet (2.5 mg total) by mouth 2 (two) times daily.  60 tablet  6  . metFORMIN (GLUCOPHAGE XR) 750 MG 24 hr tablet Take 1 tablet (750 mg total) by mouth daily with breakfast.  90 tablet  1  . SYNTHROID 100 MCG tablet TAKE 1 TABLET DAILY  90 tablet  0   No current facility-administered medications for this visit.    Allergies  Allergen Reactions  . Statins     Review of Systems negative except from HPI and PMH  Physical Exam BP 130/65  Pulse 59  Ht 5\' 5"  (1.651 m)  Wt 142 lb 3.2 oz (64.501 kg)  BMI 23.66 kg/m2 Well developed and well nourished in no acute distress HENT normal E scleral and icterus clear Neck Supple JVP6-8; carotids brisk and full Clear to ausculation  Regular rate and rhythm, widely split S2 with a 2/6 murmur Soft with active bowel sounds  No clubbing cyanosis  no} Edema Alert and oriented, grossly normal motor and sensory function Skin Warm and Dry    Assessment and  Plan

## 2013-07-01 NOTE — Telephone Encounter (Signed)
Called patient to let her know appointment made with Dr. Jones Broom 07/19/13 at 1:20 pm. Need to confirm with patient.

## 2013-07-01 NOTE — Patient Instructions (Signed)
See Dr Gala Romney and discuss plans. Call us if decision is made to go forth with implant of device.

## 2013-07-01 NOTE — Assessment & Plan Note (Signed)
The patient has persistent nonischemic cardiomyopathy associated with some concomitant coronary disease in her circumflex. Left ventricular function has remained depressed despite guidelines directed medical therapy, up titration of her ACE inhibitors limited by dizziness.  She is class IIb-3 symptoms and so does appropriately considered for CRT-D implantation. She has left bundle branch block with QRS duration of 156 ms. At this point she would like to review it with her son. She will be seeing Dr. Dorthea Cove in the next few weeks.

## 2013-07-01 NOTE — Assessment & Plan Note (Signed)
As above.

## 2013-07-06 NOTE — Telephone Encounter (Signed)
Advised patient of Dr. Jones Broom appointment, she verbalized understanding and agreeable to plan.

## 2013-07-19 ENCOUNTER — Ambulatory Visit (HOSPITAL_COMMUNITY)
Admission: RE | Admit: 2013-07-19 | Discharge: 2013-07-19 | Disposition: A | Discharge status: Home / Self Care | Payer: Medicare Other | Source: Ambulatory Visit | Attending: Internal Medicine | Admitting: Internal Medicine

## 2013-07-19 ENCOUNTER — Encounter (HOSPITAL_COMMUNITY): Payer: Self-pay

## 2013-07-19 VITALS — BP 128/70 | HR 68 | Wt 138.4 lb

## 2013-07-19 DIAGNOSIS — I420 Dilated cardiomyopathy: Secondary | ICD-10-CM

## 2013-07-19 DIAGNOSIS — I2510 Atherosclerotic heart disease of native coronary artery without angina pectoris: Secondary | ICD-10-CM | POA: Diagnosis not present

## 2013-07-19 DIAGNOSIS — E1149 Type 2 diabetes mellitus with other diabetic neurological complication: Secondary | ICD-10-CM | POA: Diagnosis not present

## 2013-07-19 DIAGNOSIS — I5022 Chronic systolic (congestive) heart failure: Secondary | ICD-10-CM | POA: Insufficient documentation

## 2013-07-19 DIAGNOSIS — E039 Hypothyroidism, unspecified: Secondary | ICD-10-CM | POA: Insufficient documentation

## 2013-07-19 DIAGNOSIS — I428 Other cardiomyopathies: Secondary | ICD-10-CM | POA: Diagnosis not present

## 2013-07-19 NOTE — Patient Instructions (Signed)
Follow up in 3 months   Do the following things EVERYDAY: 1) Weigh yourself in the morning before breakfast. Write it down and keep it in a log. 2) Take your medicines as prescribed 3) Eat low salt foods-Limit salt (sodium) to 2000 mg per day.  4) Stay as active as you can everyday 5) Limit all fluids for the day to less than 2 liters  

## 2013-07-19 NOTE — Progress Notes (Signed)
Patient ID: [name] [name], female   DOB: 03-07-1934, 79 y.o.   MRN: [id number] Referring Physician: [place]  Primary Care: Dr. Sanda Linger  Weight Range: 141 pounds Baseline proBNP: 674 on 07/30/12  HPI: [name] [name] is a 79 y.o. AAF with history of diabetes, hypothyroidism, hypertension and newly diagnosed mixed diastolic and systolic heart failure during hospitalization on 07/30/12, EF 20-25%.  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   She was evaluated by her PCP 12/13 for progressive dyspnea.  EKG showed LBBB therefore she was sent to the ER and placed on BiPap.  She had an elevated d-dimer but CTA chest neg for PE.  She was admitted for respiratory failure and found to have depressed LVEF of 20-25%.  She was diuresed and stay was c/b hypotension with acute renal failure requiring short coarse of dopamine.  Discharge Cr 0.77 (peaked 3.11).  Discharge weight 141 pounds.    Underwent Myoview 08/17/12: Overall Impression: High risk stress nuclear study. No ischemia or scar seen. Normal apical thinning is present. Severe LV systolic dysfunction. LV Ejection Fraction: 27%. LV Wall Motion: Severe global hypokinesis.   L & RHC 09/01/12 RA = 2  RV = 37/2/4  PA = 39/12 (22)  PCW = 11  Fick cardiac output/index = 3.0/1.8  Thermo CO/CI = 2.7/1.6  PVR = 4.9 Woods (Fick)  SVR = 2259  FA sat = 98%  PA sat = 57%, 59%  Ao Pressure: 135/57 (88)  LV Pressure: 136/15/20  There was no signficant gradient across the aortic valve on pullback.  Left main: Normal  LAD: Large vessel wraps apex. Gives off multiple small diagonals. 40% lesion in mid LAD. Mild plaque in apical LAD.  LCX: Nondominant. Mild to moderate non-obstructive plaque in mid vessel. Small OM-1 which is OK. Large OM-2 with tandem 90% lesions in very proximal segment. Large OM-3 with long 70-80 lesion in proximal segment.  RCA: Dominant. Proximal and mid vessel heavily calcified with diffuse 40% lesion.   LV-gram done in the RAO projection: Ejection fraction = 25-30%. Global HK. Mild MR.  CAD treated medically as she was not having CP and LV dysfunction felt to be out of proportion to CAD.  ECHO -03/18/13  EF 20-25% with dysynchrony.  She returns for follow up. Last visit lisinopril was increased to 2.5 mg twice a day but was not able to tolerate due to dizziness. Denies SOB/PND/Orthopnea. Complains of ongoing leg fatigue with ambulation. Weight at home 135-140 pounds. Compliant  with medications. Last evaluated by Dr Yetta Barre in October for her DM.  She has met with Dr Graciela Husbands regarding CRT-D but wanted to hold off until her son can be present to discuss.     Labs 04/08/13 K 4.1 Creatinine 0.9 Hemoglobin A1C 12.6 Cholesterol 187 Triglycerides 362 HDL 40     Review of Systems: All pertinent positives and negatives as in HPI, otherwise negative.   Past Medical History  Diagnosis Date  . Hypothyroidism   . High cholesterol   . Osteoarthritis   . Diabetes mellitus   . CHF (congestive heart failure)   . Congestive dilated cardiomyopathy 08/02/2012  . Left bundle branch block 07/01/2013    Current Outpatient Prescriptions  Medication Sig Dispense Refill  . aspirin EC 81 MG EC tablet Take 1 tablet (81 mg total) by mouth daily.  30 tablet  0  . atorvastatin (LIPITOR) 40 MG tablet Take 1 tablet (40 mg total) by mouth daily.  90 tablet  3  . carvedilol (COREG) 12.5 MG tablet Take 1 tablet (12.5 mg total) by mouth 2 (two) times daily with a meal.  180 tablet  6  . ergocalciferol (VITAMIN D2) 50000 UNITS capsule Take 1 capsule (50,000 Units total) by mouth once a week.  12 capsule  3  . furosemide (LASIX) 20 MG tablet Take 1 tablet (20 mg total) by mouth daily.  90 tablet  3  . glipiZIDE (GLUCOTROL) 10 MG tablet Take 1 tablet (10 mg total) by mouth 2 (two) times daily before a meal.  180 tablet  1  . glucose blood test strip Verio IQ Use TID  100 each  12  . HYDROcodone-acetaminophen (NORCO/VICODIN)  5-325 MG per tablet Take 1 tablet by mouth every 6 (six) hours as needed for pain.  65 tablet  3  . Lancets (ACCU-CHEK MULTICLIX) lancets 1 each by Other route daily. Use as instructed       . lisinopril (PRINIVIL,ZESTRIL) 2.5 MG tablet Take 2.5 mg by mouth daily.      . metFORMIN (GLUCOPHAGE XR) 750 MG 24 hr tablet Take 1 tablet (750 mg total) by mouth daily with breakfast.  90 tablet  1  . SYNTHROID 100 MCG tablet TAKE 1 TABLET DAILY  90 tablet  0   No current facility-administered medications for this encounter.    Allergies  Allergen Reactions  . Statins    PHYSICAL EXAM: Filed Vitals:   07/19/13 1331  BP: 128/70  Pulse: 68  Weight: 138 lb 6.4 oz (62.778 kg)  SpO2: 99%   General:  Well appearing. No respiratory difficulty Son present  HEENT: normal Neck: supple. JVP 5-6 Carotids 2+ bilat; no bruits. No lymphadenopathy or thryomegaly appreciated. Cor: PMI nondisplaced. Regular rate & rhythm. No rubs,  Murmurs. Wide Split Lungs: clear Abdomen: soft, nontender, nondistended. No hepatosplenomegaly. No bruits or masses. Good bowel sounds. Extremities: no cyanosis, clubbing, rash, edema.   Neuro: alert & oriented x 3, cranial nerves grossly intact. moves all 4 extremities w/o difficulty. Affect pleasant.  ASSESSMENT & PLAN: 1) Chronic systolic HF, 03/2013 EF 20-25% --NYHA II.  despite severe, persistent LV dysfunction. Ongoing exertional leg fatigue due to HF -.Volume status stable. Continue lasix 20 mg daily - Continue carvedilol to 12.5 mg BID.  -Continue lisinopril 2.5 mg in am daily. Unable to add 2.5 mg in pm in evening due to dizziness.   - Reinforced need for daily weights and reviewed use of sliding scale diuretics. Discussed at length the purpose of CRT-D and benefits with she and her son. She and her son agreeable to to CRT-D but would like to follow up with Dr Graciela Husbands. Follow up scheduled for January 13 th. Would like [employer] if possible to utilize Optivol in Clinic 2)  CAD - Continue ASA, not on statin d/t previous intolerance - no signs of ischemia 3) LBBB, chronic 4) Uncontrolled DM.   Followed by Dr Yetta Barre.  5) Hyperlipidemia- Per Dr Yetta Barre. On lipitor 40 mg daily   Follow up in 2-3 months. Hopefully after CRT-D    CLEGG,AMY,NP-C 1:46 PM

## 2013-08-24 ENCOUNTER — Ambulatory Visit (INDEPENDENT_AMBULATORY_CARE_PROVIDER_SITE_OTHER): Payer: Medicare Other | Admitting: Internal Medicine

## 2013-08-24 ENCOUNTER — Encounter: Payer: Self-pay | Admitting: Internal Medicine

## 2013-08-24 VITALS — BP 123/69 | HR 69 | Ht 65.0 in | Wt 141.0 lb

## 2013-08-24 DIAGNOSIS — I428 Other cardiomyopathies: Secondary | ICD-10-CM | POA: Diagnosis not present

## 2013-08-24 DIAGNOSIS — I420 Dilated cardiomyopathy: Secondary | ICD-10-CM

## 2013-08-24 NOTE — Progress Notes (Signed)
      Patient Care Team: [name] Grandchild, MD as PCP - General   HPI  [name] [name] is a 80 y.o. female Seen following in consultation initially in the fall 2014 for CRT. They are not sure back and visit with Dr. Dorthea Cove. They come back and he decided to pursue CRT. She has class II symptoms on guideline directed medical therapy  Past Medical History  Diagnosis Date  . Hypothyroidism   . High cholesterol   . Osteoarthritis   . Diabetes mellitus   . CHF (congestive heart failure)   . Congestive dilated cardiomyopathy 08/02/2012  . Left bundle branch block 07/01/2013    Past Surgical History  Procedure Laterality Date  . Abdominal hysterectomy  1980  . Bmd  2006    Current Outpatient Prescriptions  Medication Sig Dispense Refill  . aspirin EC 81 MG EC tablet Take 1 tablet (81 mg total) by mouth daily.  30 tablet  0  . atorvastatin (LIPITOR) 40 MG tablet Take 1 tablet (40 mg total) by mouth daily.  90 tablet  3  . carvedilol (COREG) 12.5 MG tablet Take 1 tablet (12.5 mg total) by mouth 2 (two) times daily with a meal.  180 tablet  6  . ergocalciferol (VITAMIN D2) 50000 UNITS capsule Take 1 capsule (50,000 Units total) by mouth once a week.  12 capsule  3  . furosemide (LASIX) 20 MG tablet Take 1 tablet (20 mg total) by mouth daily.  90 tablet  3  . glipiZIDE (GLUCOTROL) 10 MG tablet Take 1 tablet (10 mg total) by mouth 2 (two) times daily before a meal.  180 tablet  1  . glucose blood test strip Verio IQ Use TID  100 each  12  . HYDROcodone-acetaminophen (NORCO/VICODIN) 5-325 MG per tablet Take 1 tablet by mouth every 6 (six) hours as needed for pain.  65 tablet  3  . Lancets (ACCU-CHEK MULTICLIX) lancets 1 each by Other route daily. Use as instructed       . lisinopril (PRINIVIL,ZESTRIL) 2.5 MG tablet Take 2.5 mg by mouth daily.      . metFORMIN (GLUCOPHAGE XR) 750 MG 24 hr tablet Take 1 tablet (750 mg total) by mouth daily with breakfast.  90 tablet  1  . SYNTHROID 100 MCG  tablet TAKE 1 TABLET DAILY  90 tablet  0   No current facility-administered medications for this visit.    Allergies  Allergen Reactions  . Statins     Review of Systems negative except from HPI and PMH  Physical Exam BP 123/69  Pulse 69  Ht 5\' 5"  (1.651 m)  Wt 141 lb (63.957 kg)  BMI 23.46 kg/m2  nd nourished in no acute distress HENT normal Neck supple with JVP-flat Clear Regular rate and rhythm, no murmurs or gallops Abd-soft with active BS No Clubbing cyanosis edema Skin-warm and dry A & Oriented  Grossly normal sensory and motor function     Assessment and  Plan

## 2013-08-24 NOTE — Assessment & Plan Note (Signed)
She has class I indication for CRT. She is not sure, after lengthy discussion, when she would like to pursue a CRT device with or without a defibrillator. She will review this with her family at the time of device implantation will make the final decision.  We have discussed risks and benefits including but not limited to death, perforation of heart or lung, inappropriate shock, lead dislodgment infection. She and her son understand these risks and are willing to proceed.  She'll be continued on guideline directed medical therapy.  We have discussed further the age mitigated benefits of ICD therapy and the lack of age medication a CRT benefit  If further reviewed the fact that his ICD benefit is greater in ischemic versus nonischemic myopathy

## 2013-08-24 NOTE — Patient Instructions (Addendum)
Your physician recommends that you continue on your current medications as directed. Please refer to the Current Medication list given to you today.  Possible dates in February for CRT:  2/18 afternoon, 2/20, 2/25 afternoon, 2/27   Dory Horn, RN will call next week to schedule CRT

## 2013-09-01 DIAGNOSIS — R300 Dysuria: Secondary | ICD-10-CM | POA: Diagnosis not present

## 2013-09-01 DIAGNOSIS — R82998 Other abnormal findings in urine: Secondary | ICD-10-CM | POA: Diagnosis not present

## 2013-09-01 DIAGNOSIS — N3941 Urge incontinence: Secondary | ICD-10-CM | POA: Diagnosis not present

## 2013-09-03 ENCOUNTER — Telehealth: Payer: Self-pay | Admitting: Internal Medicine

## 2013-09-03 DIAGNOSIS — E1149 Type 2 diabetes mellitus with other diabetic neurological complication: Secondary | ICD-10-CM

## 2013-09-03 MED ORDER — HYDROCODONE-ACETAMINOPHEN 5-325 MG PO TABS: 1.0000 | ORAL_TABLET | Freq: Four times a day (QID) | ORAL | Status: DC | PRN

## 2013-09-03 NOTE — Telephone Encounter (Signed)
Patient called stating she need a refill on HYDROcodone-acetaminophen (NORCO/VICODIN) 5-325 MG per tablet  Please advise

## 2013-09-03 NOTE — Telephone Encounter (Signed)
Pt notified to pick up 

## 2013-09-03 NOTE — Telephone Encounter (Signed)
done

## 2013-09-07 ENCOUNTER — Telehealth: Payer: Self-pay | Admitting: *Deleted

## 2013-09-07 NOTE — Telephone Encounter (Signed)
Called pt to schedule CRT. Pt request 2/20. Informed pt that I would follow up this week with times/instructions. Pt agreeable to plan.

## 2013-09-08 ENCOUNTER — Encounter: Payer: Self-pay | Admitting: *Deleted

## 2013-09-14 ENCOUNTER — Other Ambulatory Visit: Payer: Self-pay | Admitting: *Deleted

## 2013-09-14 DIAGNOSIS — Z01812 Encounter for preprocedural laboratory examination: Secondary | ICD-10-CM

## 2013-09-14 DIAGNOSIS — I420 Dilated cardiomyopathy: Secondary | ICD-10-CM

## 2013-09-14 NOTE — Telephone Encounter (Signed)
Discussed procedure 2/20. Pre procedure lab work scheduled for 2/13. Letter of instructions reviewed and left at front desk for pt to pick up Friday at lab appt. Pt verbalized understanding and agreeable to plan.

## 2013-09-17 ENCOUNTER — Other Ambulatory Visit (INDEPENDENT_AMBULATORY_CARE_PROVIDER_SITE_OTHER): Payer: Medicare Other

## 2013-09-17 DIAGNOSIS — I420 Dilated cardiomyopathy: Secondary | ICD-10-CM

## 2013-09-17 DIAGNOSIS — Z01812 Encounter for preprocedural laboratory examination: Secondary | ICD-10-CM

## 2013-09-17 DIAGNOSIS — I428 Other cardiomyopathies: Secondary | ICD-10-CM | POA: Diagnosis not present

## 2013-09-17 LAB — CBC WITH DIFFERENTIAL/PLATELET
BASOS ABS: 0 10*3/uL (ref 0.0–0.1)
BASOS PCT: 0.7 % (ref 0.0–3.0)
Eosinophils Absolute: 0.1 10*3/uL (ref 0.0–0.7)
Eosinophils Relative: 1.9 % (ref 0.0–5.0)
HCT: 40.4 % (ref 36.0–46.0)
HEMOGLOBIN: 13 g/dL (ref 12.0–15.0)
LYMPHS PCT: 50.6 % — AB (ref 12.0–46.0)
Lymphs Abs: 3.7 10*3/uL (ref 0.7–4.0)
MCHC: 32.3 g/dL (ref 30.0–36.0)
MCV: 83.8 fl (ref 78.0–100.0)
MONOS PCT: 5.3 % (ref 3.0–12.0)
Monocytes Absolute: 0.4 10*3/uL (ref 0.1–1.0)
NEUTROS ABS: 3 10*3/uL (ref 1.4–7.7)
NEUTROS PCT: 41.5 % — AB (ref 43.0–77.0)
Platelets: 252 10*3/uL (ref 150.0–400.0)
RBC: 4.82 Mil/uL (ref 3.87–5.11)
RDW: 14.4 % (ref 11.5–14.6)
WBC: 7.3 10*3/uL (ref 4.5–10.5)

## 2013-09-17 LAB — BASIC METABOLIC PANEL
BUN: 17 mg/dL (ref 6–23)
CALCIUM: 9.4 mg/dL (ref 8.4–10.5)
CO2: 26 mEq/L (ref 19–32)
Chloride: 104 mEq/L (ref 96–112)
Creatinine, Ser: 0.8 mg/dL (ref 0.4–1.2)
GFR: 89.37 mL/min (ref >60.00)
Glucose, Bld: 149 mg/dL — ABNORMAL HIGH (ref 70–99)
Potassium: 3.5 mEq/L (ref 3.5–5.1)
SODIUM: 137 meq/L (ref 135–145)

## 2013-09-20 DIAGNOSIS — R82998 Other abnormal findings in urine: Secondary | ICD-10-CM | POA: Diagnosis not present

## 2013-09-20 DIAGNOSIS — R300 Dysuria: Secondary | ICD-10-CM | POA: Diagnosis not present

## 2013-09-27 ENCOUNTER — Encounter (HOSPITAL_COMMUNITY): Payer: Self-pay | Admitting: Pharmacy Technician

## 2013-09-30 DIAGNOSIS — E7800 Pure hypercholesterolemia, unspecified: Secondary | ICD-10-CM | POA: Diagnosis not present

## 2013-09-30 DIAGNOSIS — E039 Hypothyroidism, unspecified: Secondary | ICD-10-CM | POA: Diagnosis not present

## 2013-09-30 DIAGNOSIS — E785 Hyperlipidemia, unspecified: Secondary | ICD-10-CM | POA: Diagnosis not present

## 2013-09-30 DIAGNOSIS — I2589 Other forms of chronic ischemic heart disease: Secondary | ICD-10-CM | POA: Diagnosis not present

## 2013-09-30 DIAGNOSIS — I2510 Atherosclerotic heart disease of native coronary artery without angina pectoris: Secondary | ICD-10-CM | POA: Diagnosis not present

## 2013-09-30 DIAGNOSIS — I428 Other cardiomyopathies: Secondary | ICD-10-CM | POA: Diagnosis not present

## 2013-09-30 DIAGNOSIS — I10 Essential (primary) hypertension: Secondary | ICD-10-CM | POA: Diagnosis not present

## 2013-09-30 DIAGNOSIS — K219 Gastro-esophageal reflux disease without esophagitis: Secondary | ICD-10-CM | POA: Diagnosis not present

## 2013-09-30 DIAGNOSIS — I447 Left bundle-branch block, unspecified: Secondary | ICD-10-CM | POA: Diagnosis not present

## 2013-09-30 DIAGNOSIS — Z7982 Long term (current) use of aspirin: Secondary | ICD-10-CM | POA: Diagnosis not present

## 2013-09-30 DIAGNOSIS — E1149 Type 2 diabetes mellitus with other diabetic neurological complication: Secondary | ICD-10-CM | POA: Diagnosis not present

## 2013-09-30 DIAGNOSIS — M1990 Unspecified osteoarthritis, unspecified site: Secondary | ICD-10-CM | POA: Diagnosis not present

## 2013-09-30 DIAGNOSIS — I509 Heart failure, unspecified: Secondary | ICD-10-CM | POA: Diagnosis not present

## 2013-09-30 DIAGNOSIS — E1142 Type 2 diabetes mellitus with diabetic polyneuropathy: Secondary | ICD-10-CM | POA: Diagnosis not present

## 2013-09-30 DIAGNOSIS — I5022 Chronic systolic (congestive) heart failure: Secondary | ICD-10-CM | POA: Diagnosis not present

## 2013-09-30 MED ORDER — SODIUM CHLORIDE 0.9 % IR SOLN
80.0000 mg | Status: DC
  Filled 2013-09-30: qty 2

## 2013-09-30 MED ORDER — CEFAZOLIN SODIUM-DEXTROSE 2-3 GM-% IV SOLR
2.0000 g | INTRAVENOUS | Status: DC
  Filled 2013-09-30: qty 50

## 2013-10-01 ENCOUNTER — Encounter (HOSPITAL_COMMUNITY): Payer: Self-pay | Admitting: General Practice

## 2013-10-01 ENCOUNTER — Encounter (HOSPITAL_COMMUNITY)
Admission: RE | Disposition: A | Discharge status: Home / Self Care | Payer: Self-pay | Source: Ambulatory Visit | Attending: Internal Medicine

## 2013-10-01 ENCOUNTER — Ambulatory Visit (HOSPITAL_COMMUNITY)
Admission: RE | Admit: 2013-10-01 | Discharge: 2013-10-02 | Disposition: A | Discharge status: Home / Self Care | Payer: Medicare Other | Source: Ambulatory Visit | Attending: Internal Medicine | Admitting: Internal Medicine

## 2013-10-01 DIAGNOSIS — Z7982 Long term (current) use of aspirin: Secondary | ICD-10-CM | POA: Insufficient documentation

## 2013-10-01 DIAGNOSIS — M1990 Unspecified osteoarthritis, unspecified site: Secondary | ICD-10-CM | POA: Insufficient documentation

## 2013-10-01 DIAGNOSIS — I420 Dilated cardiomyopathy: Secondary | ICD-10-CM | POA: Diagnosis present

## 2013-10-01 DIAGNOSIS — K219 Gastro-esophageal reflux disease without esophagitis: Secondary | ICD-10-CM | POA: Insufficient documentation

## 2013-10-01 DIAGNOSIS — I2589 Other forms of chronic ischemic heart disease: Secondary | ICD-10-CM | POA: Insufficient documentation

## 2013-10-01 DIAGNOSIS — I5022 Chronic systolic (congestive) heart failure: Secondary | ICD-10-CM | POA: Diagnosis not present

## 2013-10-01 DIAGNOSIS — E1142 Type 2 diabetes mellitus with diabetic polyneuropathy: Secondary | ICD-10-CM | POA: Insufficient documentation

## 2013-10-01 DIAGNOSIS — I2510 Atherosclerotic heart disease of native coronary artery without angina pectoris: Secondary | ICD-10-CM | POA: Diagnosis present

## 2013-10-01 DIAGNOSIS — E7800 Pure hypercholesterolemia, unspecified: Secondary | ICD-10-CM | POA: Insufficient documentation

## 2013-10-01 DIAGNOSIS — I428 Other cardiomyopathies: Secondary | ICD-10-CM | POA: Diagnosis not present

## 2013-10-01 DIAGNOSIS — I10 Essential (primary) hypertension: Secondary | ICD-10-CM | POA: Insufficient documentation

## 2013-10-01 DIAGNOSIS — I509 Heart failure, unspecified: Secondary | ICD-10-CM | POA: Insufficient documentation

## 2013-10-01 DIAGNOSIS — E039 Hypothyroidism, unspecified: Secondary | ICD-10-CM | POA: Insufficient documentation

## 2013-10-01 DIAGNOSIS — I447 Left bundle-branch block, unspecified: Secondary | ICD-10-CM | POA: Insufficient documentation

## 2013-10-01 DIAGNOSIS — E1140 Type 2 diabetes mellitus with diabetic neuropathy, unspecified: Secondary | ICD-10-CM | POA: Diagnosis present

## 2013-10-01 DIAGNOSIS — E785 Hyperlipidemia, unspecified: Secondary | ICD-10-CM | POA: Insufficient documentation

## 2013-10-01 DIAGNOSIS — E1149 Type 2 diabetes mellitus with other diabetic neurological complication: Secondary | ICD-10-CM | POA: Insufficient documentation

## 2013-10-01 HISTORY — DX: Gastro-esophageal reflux disease without esophagitis: K21.9

## 2013-10-01 HISTORY — PX: BI-VENTRICULAR PACEMAKER INSERTION: SHX5462

## 2013-10-01 LAB — GLUCOSE, CAPILLARY
GLUCOSE-CAPILLARY: 227 mg/dL — AB (ref 70–99)
GLUCOSE-CAPILLARY: 260 mg/dL — AB (ref 70–99)
GLUCOSE-CAPILLARY: 232 mg/dL — AB (ref 70–99)
Glucose-Capillary: 275 mg/dL — ABNORMAL HIGH (ref 70–99)

## 2013-10-01 LAB — SURGICAL PCR SCREEN
MRSA, PCR: NEGATIVE
Staphylococcus aureus: NEGATIVE

## 2013-10-01 SURGERY — BI-VENTRICULAR PACEMAKER INSERTION (CRT-P)
Anesthesia: LOCAL

## 2013-10-01 MED ORDER — YOU HAVE A PACEMAKER BOOK
Freq: Once | Status: AC
  Administered 2013-10-01: 21:00:00
  Filled 2013-10-01: qty 1

## 2013-10-01 MED ORDER — GLIPIZIDE 10 MG PO TABS
10.0000 mg | ORAL_TABLET | Freq: Two times a day (BID) | ORAL | Status: DC
  Administered 2013-10-01 – 2013-10-02 (×2): 10 mg via ORAL
  Filled 2013-10-01 (×4): qty 1

## 2013-10-01 MED ORDER — INSULIN ASPART 100 UNIT/ML ~~LOC~~ SOLN
0.0000 [IU] | Freq: Three times a day (TID) | SUBCUTANEOUS | Status: DC
  Administered 2013-10-01: 19:00:00 5 [IU] via SUBCUTANEOUS
  Administered 2013-10-02: 09:00:00 2 [IU] via SUBCUTANEOUS

## 2013-10-01 MED ORDER — FUROSEMIDE 20 MG PO TABS
20.0000 mg | ORAL_TABLET | Freq: Every day | ORAL | Status: DC
  Filled 2013-10-01 (×2): qty 1

## 2013-10-01 MED ORDER — ASPIRIN EC 81 MG PO TBEC
81.0000 mg | DELAYED_RELEASE_TABLET | Freq: Every day | ORAL | Status: DC
  Administered 2013-10-01 – 2013-10-02 (×2): 81 mg via ORAL
  Filled 2013-10-01 (×2): qty 1

## 2013-10-01 MED ORDER — SODIUM CHLORIDE 0.9 % IV SOLN
INTRAVENOUS | Status: DC
  Administered 2013-10-01: 10:00:00 via INTRAVENOUS

## 2013-10-01 MED ORDER — SODIUM CHLORIDE 0.9 % IV SOLN: INTRAVENOUS | Status: AC

## 2013-10-01 MED ORDER — LISINOPRIL 2.5 MG PO TABS
2.5000 mg | ORAL_TABLET | Freq: Every day | ORAL | Status: DC
  Administered 2013-10-01 – 2013-10-02 (×2): 2.5 mg via ORAL
  Filled 2013-10-01 (×2): qty 1

## 2013-10-01 MED ORDER — METFORMIN HCL ER 750 MG PO TB24
750.0000 mg | ORAL_TABLET | Freq: Once | ORAL | Status: DC
  Filled 2013-10-01: qty 1

## 2013-10-01 MED ORDER — METFORMIN HCL ER 750 MG PO TB24
750.0000 mg | ORAL_TABLET | Freq: Every day | ORAL | Status: DC
  Filled 2013-10-01 (×2): qty 1

## 2013-10-01 MED ORDER — MUPIROCIN 2 % EX OINT: TOPICAL_OINTMENT | Freq: Two times a day (BID) | CUTANEOUS | Status: DC

## 2013-10-01 MED ORDER — CARVEDILOL 12.5 MG PO TABS
12.5000 mg | ORAL_TABLET | Freq: Two times a day (BID) | ORAL | Status: DC
  Administered 2013-10-01 – 2013-10-02 (×2): 12.5 mg via ORAL
  Filled 2013-10-01 (×3): qty 1

## 2013-10-01 MED ORDER — ACETAMINOPHEN 325 MG PO TABS: 325.0000 mg | ORAL_TABLET | ORAL | Status: DC | PRN

## 2013-10-01 MED ORDER — ATORVASTATIN CALCIUM 40 MG PO TABS
40.0000 mg | ORAL_TABLET | Freq: Every day | ORAL | Status: DC
  Administered 2013-10-01 – 2013-10-02 (×2): 40 mg via ORAL
  Filled 2013-10-01 (×2): qty 1

## 2013-10-01 MED ORDER — CHLORHEXIDINE GLUCONATE 4 % EX LIQD: 60.0000 mL | Freq: Once | CUTANEOUS | Status: DC

## 2013-10-01 MED ORDER — MUPIROCIN 2 % EX OINT
TOPICAL_OINTMENT | CUTANEOUS | Status: AC
  Administered 2013-10-01: 1 via NASAL
  Filled 2013-10-01: qty 22

## 2013-10-01 MED ORDER — HYDROCODONE-ACETAMINOPHEN 5-325 MG PO TABS
1.0000 | ORAL_TABLET | Freq: Four times a day (QID) | ORAL | Status: DC | PRN
  Administered 2013-10-01: 17:00:00 1 via ORAL
  Filled 2013-10-01: qty 1

## 2013-10-01 MED ORDER — LIDOCAINE HCL (PF) 1 % IJ SOLN
INTRAMUSCULAR | Status: AC
  Filled 2013-10-01: qty 60

## 2013-10-01 MED ORDER — CEFAZOLIN SODIUM 1-5 GM-% IV SOLN
1.0000 g | Freq: Four times a day (QID) | INTRAVENOUS | Status: AC
  Administered 2013-10-01 – 2013-10-02 (×3): 1 g via INTRAVENOUS
  Filled 2013-10-01 (×3): qty 50

## 2013-10-01 MED ORDER — ONDANSETRON HCL 4 MG/2ML IJ SOLN: 4.0000 mg | Freq: Four times a day (QID) | INTRAMUSCULAR | Status: DC | PRN

## 2013-10-01 MED ORDER — LEVOTHYROXINE SODIUM 100 MCG PO TABS
100.0000 ug | ORAL_TABLET | Freq: Every day | ORAL | Status: DC
  Administered 2013-10-02: 100 ug via ORAL
  Filled 2013-10-01 (×2): qty 1

## 2013-10-01 MED ORDER — MIDAZOLAM HCL 5 MG/5ML IJ SOLN
INTRAMUSCULAR | Status: AC
  Filled 2013-10-01: qty 5

## 2013-10-01 NOTE — CV Procedure (Signed)
[name] [name] [id number]  [id number]  Preop Dx: NICM CHF LBBB Postop Dx same/   Procedure:CRT-P implantation with [employer] system  Cx: None   Dictation number [id number]  Sherryl Manges, MD 10/01/2013 12:53 PM 5

## 2013-10-01 NOTE — Discharge Summary (Signed)
ELECTROPHYSIOLOGY PROCEDURE DISCHARGE SUMMARY    Patient ID: [name] [name],  MRN: [id number], DOB/AGE: Dec 10, 1933 80 y.o.  Admit date: 10/01/2013 Discharge date: 10/02/2013  Primary Care Physician: Sanda Linger, MD Primary Cardiologist: Nicholes Mango, MD Electrophysiologist: Sherryl Manges, MD  Primary Discharge Diagnosis:  Mixed cardiomyopathy, LBBB, congestive heart failure s/p CRTP implantation this admission  Secondary Discharge Diagnosis:  1.  Hypothyroidism 2.  Hyperlipidemia 3.  Diabetes 4.  CAD - non obstructive by cath 2014  Allergies  Allergen Reactions  . Statins      Procedures This Admission:  1.  Implantation of STJ cardiac resynchronization therapy pacemaker on 10-01-13 by Dr Graciela Husbands.  The patient received a STJ 3242 pacemaker with model number 1688 right atrial, 1688 right ventricular and 1458Q left ventricular lead.  There were no early apparent complications. 2.  CXR on 10-02-13 demonstrated no PTX  Brief HPI: [name] [name] is a 80 y.o. female with a past medical history of mixed cardiomyopathy, LBBB, and congestive heart failure despite guideline directed therapy.  She was referred to Dr Graciela Husbands for consideration of device implantation.   Risks, benefits, and alternatives to CRTP implantation were reviewed with the patient who wished to proceed.   Hospital Course:  The patient was admitted and underwent implantation of a STJ CRTP with details as outlined above.   She was monitored on telemetry overnight which demonstrated sinus rhythm with ventricular pacing.  Left chest was without hematoma or ecchymosis.  The device was interrogated and found to be functioning normally.  CXR was obtained and demonstrated no pneumothorax status post device implantation.  Wound care, arm mobility, and restrictions were reviewed with the patient.  Dr Ladona Ridgel examined the patient and considered them stable for discharge to home.    Discharge Vitals: Blood pressure 111/47, pulse  68, temperature 98.3 F (36.8 C), temperature source Oral, resp. rate 20, height 5\' 5"  (1.651 m), weight 139 lb 15.9 oz (63.5 kg), SpO2 96.00%.   Labs:   Lab Results  Component Value Date   WBC 7.3 09/17/2013   HGB 13.0 09/17/2013   HCT 40.4 09/17/2013   MCV 83.8 09/17/2013   PLT 252.0 09/17/2013   No results found for this basename: NA, K, CL, CO2, BUN, CREATININE, CALCIUM, LABALBU, PROT, BILITOT, ALKPHOS, ALT, AST, GLUCOSE,  in the last 168 hours    Discharge Medications:    Medication List    ASK your doctor about these medications       aspirin 81 MG EC tablet  Take 1 tablet (81 mg total) by mouth daily.     atorvastatin 40 MG tablet  Commonly known as:  LIPITOR  Take 1 tablet (40 mg total) by mouth daily.     carvedilol 12.5 MG tablet  Commonly known as:  COREG  Take 1 tablet (12.5 mg total) by mouth 2 (two) times daily with a meal.     ergocalciferol 50000 UNITS capsule  Commonly known as:  VITAMIN D2  Take 1 capsule (50,000 Units total) by mouth once a week.     furosemide 20 MG tablet  Commonly known as:  LASIX  Take 1 tablet (20 mg total) by mouth daily.     glipiZIDE 10 MG tablet  Commonly known as:  GLUCOTROL  Take 1 tablet (10 mg total) by mouth 2 (two) times daily before a meal.     HYDROcodone-acetaminophen 5-325 MG per tablet  Commonly known as:  NORCO/VICODIN  Take 1 tablet by mouth every  6 (six) hours as needed (pain).     levothyroxine 100 MCG tablet  Commonly known as:  SYNTHROID, LEVOTHROID  Take 100 mcg by mouth daily before breakfast.     lisinopril 2.5 MG tablet  Commonly known as:  PRINIVIL,ZESTRIL  Take 2.5 mg by mouth daily.     metFORMIN 750 MG 24 hr tablet  Commonly known as:  GLUCOPHAGE XR  Take 1 tablet (750 mg total) by mouth daily with breakfast.         Future Appointments Provider Department Dept Phone   10/13/2013 3:30 PM [facility] [phone]   01/05/2014 8:45 AM Duke SalviaSteven C Klein, MD [facility] [address] Office [phone]       Duration of Discharge Encounter: Greater than 30 minutes including physician time.  Signed,  Leonia ReevesGregg Taylor,M.D.

## 2013-10-01 NOTE — Consult Note (Signed)
       Patient Care Team: [name], MD as PCP - General   HPI  [name] [name] is a 80 y.o. female Seen for CRT implanat for NICM CHF with LBBB Stable sob without chest pain Some GERD disocomfort  Past Medical History  Diagnosis Date  . Hypothyroidism   . High cholesterol   . Osteoarthritis   . Diabetes mellitus   . CHF (congestive heart failure)   . Congestive dilated cardiomyopathy 08/02/2012  . Left bundle branch block 07/01/2013    Past Surgical History  Procedure Laterality Date  . Abdominal hysterectomy  1980  . Bmd  2006    Current Facility-Administered Medications  Medication Dose Route Frequency Provider Last Rate Last Dose  . ceFAZolin (ANCEF) IVPB 2 g/50 mL premix  2 g Intravenous On Call [employer], PA-C      . gentamicin (GARAMYCIN) 80 mg in sodium chloride irrigation 0.9 % 500 mL irrigation  80 mg Irrigation On Call [employer], PA-C        Allergies  Allergen Reactions  . Statins     Review of Systems negative except from HPI and PMH  Physical Exam There were no vitals taken for this visit. Well developed and well nourished in no acute distress HENT normal E scleral and icterus clear Neck Supple JVP flat; carotids brisk and full Clear to ausculation  Regular rate and rhythm, no murmurs gallops or rub Soft with active bowel sounds No clubbing cyanosis none Edema Alert and oriented, grossly normal motor and sensory function Skin Warm and Dry  ECG  NSR LBBB 16/18/43   Assessment and  Plan  NICM CHF class II-III LBBB  The benefits and risks were reviewed including but not limited to death,  perforation, infection, lead dislodgement and device malfunction.  The patient understands agrees and is willing to proceed.\.

## 2013-10-02 ENCOUNTER — Ambulatory Visit (HOSPITAL_COMMUNITY): Payer: Medicare Other

## 2013-10-02 DIAGNOSIS — E039 Hypothyroidism, unspecified: Secondary | ICD-10-CM | POA: Diagnosis not present

## 2013-10-02 DIAGNOSIS — I509 Heart failure, unspecified: Secondary | ICD-10-CM | POA: Diagnosis not present

## 2013-10-02 DIAGNOSIS — I5022 Chronic systolic (congestive) heart failure: Secondary | ICD-10-CM | POA: Diagnosis not present

## 2013-10-02 DIAGNOSIS — Z472 Encounter for removal of internal fixation device: Secondary | ICD-10-CM | POA: Diagnosis not present

## 2013-10-02 DIAGNOSIS — I517 Cardiomegaly: Secondary | ICD-10-CM | POA: Diagnosis not present

## 2013-10-02 DIAGNOSIS — I428 Other cardiomyopathies: Secondary | ICD-10-CM | POA: Diagnosis not present

## 2013-10-02 DIAGNOSIS — I2589 Other forms of chronic ischemic heart disease: Secondary | ICD-10-CM | POA: Diagnosis not present

## 2013-10-02 DIAGNOSIS — I447 Left bundle-branch block, unspecified: Secondary | ICD-10-CM | POA: Diagnosis not present

## 2013-10-02 LAB — GLUCOSE, CAPILLARY: Glucose-Capillary: 165 mg/dL — ABNORMAL HIGH (ref 70–99)

## 2013-10-02 MED ORDER — METFORMIN HCL ER 750 MG PO TB24: 750.0000 mg | ORAL_TABLET | Freq: Every day | ORAL | Status: DC

## 2013-10-02 MED ORDER — TRAMADOL HCL 50 MG PO TABS: 50.0000 mg | ORAL_TABLET | Freq: Four times a day (QID) | ORAL | Status: DC | PRN

## 2013-10-02 NOTE — Op Note (Signed)
NAMCarlton [name]:  Schwalbe, Saryiah               ACCOUNT NO.:  [id number]  MEDICAL RECORD NO.:  [id number]  LOCATION:  6C06C                        FACILITY:  [facility]  PHYSICIAN:  Duke SalviaSteven C. Klein, MD, FACCDATE OF BIRTH:  [birth year]  DATE OF PROCEDURE:  10/01/2013 DATE OF DISCHARGE:                              OPERATIVE REPORT   PREOPERATIVE DIAGNOSIS:  Nonischemic cardiomyopathy with left bundle branch block and congestive heart failure.  POSTOPERATIVE DIAGNOSIS:  Nonischemic cardiomyopathy with left bundle branch block and congestive heart failure.  PROCEDURE:  Dual-chamber pacemaker implantation with left ventricular lead placement.  DESCRIPTION OF PROCEDURE:  Following obtaining informed consent, the patient was brought to the electrophysiology laboratory and placed on the fluoroscopic table in a supine position.  After routine prep and drape of the left upper chest, lidocaine was infiltrated in prepectoral subclavicular region.  Incision was made and carried down to layer of the prepectoral fascia using electrocautery and sharp dissection.  A pocket was formed similarly.  Hemostasis was obtained.  Thereafter attention was turned to gain access to the extrathoracic left subclavian vein, which was accomplished without difficulty and without the aspiration or puncture of the artery.  Three separate venipunctures were accomplished.  Guidewires were placed and retained and sequentially a 9.5-French and 7-French sheaths were placed through which we passed a [facility] 7-French active fixation ventricular lead, serial# [id number], at [facility] 135CS cannulation system and a Saint Jude 1588 46 cm atrial lead, serial [id number].  Under fluoroscopic guidance, the RV lead was manipulated at the apex. The bipolar R-wave was 8.2 with a pace impedance of 698, a threshold 0.64.  There is no diaphragmatic pacing at 10 V and the current of injury was brisk.  This lead was secured to the prepectoral  fascia.  We then undertook to gain access to the coronary sinus, which was accomplished with mild difficulty and venography demonstrated a single posterolateral branch, which was easily cannulated.  A quadripolar lead- Saint Jude Quartet [id number], serial# [id number] was delivered through this vein.  Unfortunately along its terminal ramifications poles 1, 2, and 3 were all associated with diaphragmatic stimulation or inability to capture.  We ended up using a pole 4 and a pole 2 can, and a pole 2 RV ring configuration, where thresholds were less than 2 V at 0.4 milliseconds, and the RV and LV interval was greater than 140 milliseconds.  A 9.5-French sheath was removed.  The coronary sinus cannulation sheath was pulled back into the right atrium and the right atrial lead was deployed.  It was deployed to the right atrial appendage.  The bipolar P-wave was 2.5 with threshold of less than 2 V at 0.4.  Initially, the lead was secured.  At this point, the sheath because it was relatively unstable was removed under fluoroscopic guidance.  The lead position was stable fluoroscopically and electrically.  The RV lead was then secured to the prepectoral fascia.  The leads were attached to a [facility] Allure pulse generator, model [id number], serial [id number].  Through the device, a bipolar P-wave was 4.2 with a pace impedance of 590, threshold 1 V at 0.4.  The R-wave was greater than 12 with a pace  impedance of 640, threshold 0.5 at 0.4 and the LV impedance was 39 and threshold of 2 V at 1.  The leads were secured to the prepectoral fascia and leads in the pulse generator were then attached to the prepectoral fascia and secured.  Also hemostasis having been assured, the pocket was irrigated copiously with antibiotic containing saline solution and closed in 2 layers in normal fashion.  The wound was washed, dried, and a Dermabond dressing was applied.  Needle counts, sponge counts, and instrument counts  were correct at the end of procedure according to staff.  The patient tolerated the procedure without apparent complication.     Duke Salvia, MD, [facility]     SCK/MEDQ  D:  10/01/2013  T:  10/02/2013  Job:  [id number]

## 2013-10-02 NOTE — Discharge Instructions (Signed)
PLEASE TAKE ALL NEW MEDICATIONS/MEDICATION CHANGES AS PRESCRIBED. RESTART METFORMIN ON 10/04/13.  PLEASE ATTEND ALL SCHEDULED/RECOMMENDED FOLLOW-UP APPOINTMENTS.   Supplemental Discharge Instructions for  Pacemaker Patients  Activity No heavy lifting or vigorous activity with your left/right arm for 6 to 8 weeks.  Do not raise your left/right arm above your head for one week.  Gradually raise your affected arm as drawn below.          10/05/13                      10/06/13                    10/07/13                  10/08/13  NO DRIVING for 1 week; you may begin driving on 8/83/25. WOUND CARE   Keep the wound area clean and dry.  Do not get this area wet for one week. No showers for one week; you may shower on               10/08/13.   The tape/steri-strips on your wound will fall off; do not pull them off.  No bandage is needed on the site.  DO  NOT apply any creams, oils, or ointments to the wound area.   If you notice any drainage or discharge from the wound, any swelling or bruising at the site, or you develop a fever > 101? F after you are discharged home, call the office at once.  Special Instructions   You are still able to use cellular telephones; use the ear opposite the side where you have your pacemaker/defibrillator.  Avoid carrying your cellular phone near your device.   When traveling through airports, show security personnel your identification card to avoid being screened in the metal detectors.  Ask the security personnel to use the hand wand.   Avoid arc welding equipment, MRI testing (magnetic resonance imaging), TENS units (transcutaneous nerve stimulators).  Call the office for questions about other devices.   Avoid electrical appliances that are in poor condition or are not properly grounded. Microwave ovens are safe to be near or to operate.

## 2013-10-02 NOTE — Progress Notes (Signed)
Patient Name: [name] [name] Date of Encounter: 10/02/2013     Principal Problem:   Congestive dilated cardiomyopathy Active Problems:   HYPOTHYROIDISM   Type II or unspecified type diabetes mellitus with neurological manifestations, uncontrolled(250.62)   DIABETIC PERIPHERAL NEUROPATHY   Hyperlipidemia LDL goal < 70   HTN (hypertension)   Chronic systolic heart failure   Coronary atherosclerosis of native coronary artery   Left bundle branch block   SUBJECTIVE: Feels well this AM. Some chest wall discomfort. No SOB, lightheadedness or syncope overnight.   OBJECTIVE  Filed Vitals:   10/01/13 2057 10/02/13 0013 10/02/13 0501 10/02/13 0809  BP: 93/48 93/47 111/47 124/71  Pulse: 66 62 68 70  Temp: 98.5 F (36.9 C) 98.2 F (36.8 C) 98.3 F (36.8 C) 98 F (36.7 C)  TempSrc: Oral Oral Oral Oral  Resp: 16 16 20 18   Height:      Weight:  63.5 kg (139 lb 15.9 oz)    SpO2: 96% 91% 96% 96%    Intake/Output Summary (Last 24 hours) at 10/02/13 16100812 Last data filed at 10/01/13 1705  Gross per 24 hour  Intake 206.67 ml  Output      0 ml  Net 206.67 ml   Weight change:   PHYSICAL EXAM  General: Well developed, well nourished, in no acute distress. Head: Normocephalic, atraumatic, sclera non-icteric, no xanthomas, nares are without discharge.  Neck: Supple without bruits or JVD. Lungs:  Resp regular and unlabored, CTAB, no appreciable diminished lung sounds. Heart: RRR no s3, s4, or murmurs. Abdomen: Soft, non-tender, non-distended, BS + x 4.  Msk: Linear R upper chest incision w/o evidence of erythema. Mild tendnerness gentle palpation. Strength and tone appears normal for age. Extremities: No clubbing, cyanosis or edema. DP/PT/Radials 2+ and equal bilaterally. Neuro: Alert and oriented X 3. Moves all extremities spontaneously. Psych: Normal affect.  LABS:  None  TELE: NSR, intermittent A-sensing, Bi-V pacing  ECG: A-sensing, Bi-V pacing, 62 bpm,  LAD  Radiology/Studies:  Dg Chest 2 View  10/02/2013   CLINICAL DATA:  Status post pacemaker placement.  EXAM: CHEST  2 VIEW  COMPARISON:  08/05/2012  FINDINGS: There has been interval placement of a left-sided permanent trans venous pacemaker with tips in the expected regions of the right atrium, right ventricle, and coronary sinus. The cardiac silhouette is mildly enlarged. The lungs are well inflated and clear. Trace bilateral pleural effusions are not excluded. No pneumothorax is seen. Mild anterior wedging of a lower thoracic vertebral body is unchanged.  IMPRESSION: Interval placement of pacemaker as above. Clear lungs. Trace pleural effusions not excluded.   Electronically Signed   By: Sebastian AcheAllen  Grady   On: 10/02/2013 07:49    Current Medications:  . aspirin EC  81 mg Oral Daily  . atorvastatin  40 mg Oral Daily  . carvedilol  12.5 mg Oral BID WC  . furosemide  20 mg Oral Daily  . glipiZIDE  10 mg Oral BID AC  . insulin aspart  0-9 Units Subcutaneous TID WC  . levothyroxine  100 mcg Oral QAC breakfast  . lisinopril  2.5 mg Oral Daily  . metFORMIN  750 mg Oral Q breakfast  . metFORMIN  750 mg Oral Once    ASSESSMENT AND PLAN:  1. Dilated cardiomyopathy- EF 25-30%, LBBB, NYHA class II symptoms s/p St. Jude CRT-P placement. Patient elected against CRT-D device. No immediate post-proceduree complications. Incision healing well w/o evidence of complication. No evidence of PTX on exam or  CXR this AM. Device functioning properly.  -- Device/wound care clinic in 7-10 days -- Follow-up EP 3 months -- Post-PPM activity restrictions -- Ultram for chest wall discomfort  -- Continue GDMT  2. Hypothyroidism -- Continue Synthroid  3. Hyperlipidemia -- Continue statin  4. Type 2 DM -- Resume metformin on Monday   Signed, R. Hurman Horn, PA-C 10/02/2013, 8:12 AM  EP Attending  Patient seen and examined. Agree with above exam, assessment and plan.  Leonia Reeves.D.

## 2013-10-03 NOTE — H&P (Signed)
      Patient Care Team: [name], MD as PCP - General   HPI  [name] [name] is a 80 y.o. female With NICM CHF and LBBB for CRT implantations  She has decided to proceed with CRTP and not CRTD  ingterval hx is without note  Past Medical History  Diagnosis Date  . Hypothyroidism   . High cholesterol   . Osteoarthritis   . CHF (congestive heart failure)   . Congestive dilated cardiomyopathy 08/02/2012  . Left bundle branch block 07/01/2013  . Diabetes mellitus     TYPE 2  . GERD (gastroesophageal reflux disease)     Past Surgical History  Procedure Laterality Date  . Abdominal hysterectomy  1980  . Bmd  2006  . Insert / replace / remove pacemaker  10/01/2013    DR Graciela Husbands    No current facility-administered medications for this encounter.   Current Outpatient Prescriptions  Medication Sig Dispense Refill  . aspirin EC 81 MG EC tablet Take 1 tablet (81 mg total) by mouth daily.  30 tablet  0  . atorvastatin (LIPITOR) 40 MG tablet Take 1 tablet (40 mg total) by mouth daily.  90 tablet  3  . carvedilol (COREG) 12.5 MG tablet Take 1 tablet (12.5 mg total) by mouth 2 (two) times daily with a meal.  180 tablet  6  . ergocalciferol (VITAMIN D2) 50000 UNITS capsule Take 1 capsule (50,000 Units total) by mouth once a week.  12 capsule  3  . furosemide (LASIX) 20 MG tablet Take 1 tablet (20 mg total) by mouth daily.  90 tablet  3  . glipiZIDE (GLUCOTROL) 10 MG tablet Take 1 tablet (10 mg total) by mouth 2 (two) times daily before a meal.  180 tablet  1  . HYDROcodone-acetaminophen (NORCO/VICODIN) 5-325 MG per tablet Take 1 tablet by mouth every 6 (six) hours as needed (pain).      Marland Kitchen levothyroxine (SYNTHROID, LEVOTHROID) 100 MCG tablet Take 100 mcg by mouth daily before breakfast.      . lisinopril (PRINIVIL,ZESTRIL) 2.5 MG tablet Take 2.5 mg by mouth daily.      Melene Muller ON 10/04/2013] metFORMIN (GLUCOPHAGE XR) 750 MG 24 hr tablet Take 1 tablet (750 mg total) by mouth daily  with breakfast.  90 tablet  1  . traMADol (ULTRAM) 50 MG tablet Take 1 tablet (50 mg total) by mouth every 6 (six) hours as needed for moderate pain.  10 tablet  0    Allergies  Allergen Reactions  . Statins     Review of Systems negative except from HPI and PMH  Physical Exam BP 124/71  Pulse 70  Temp(Src) 98 F (36.7 C) (Oral)  Resp 18  Ht 5\' 5"  (1.651 m)  Wt 139 lb 15.9 oz (63.5 kg)  BMI 23.30 kg/m2  SpO2 96% Well developed and nourished in no acute distress HENT normal Neck supple with JVP-flat Clear Regular rate and rhythm, no murmurs or gallops Abd-soft with active BS No Clubbing cyanosis edema Skin-warm and dry A & Oriented  Grossly normal sensory and motor function    Assessment and  Plan  NICM    CHF    LBBB  for

## 2013-10-13 ENCOUNTER — Ambulatory Visit: Payer: Medicare Other

## 2013-10-18 ENCOUNTER — Ambulatory Visit (INDEPENDENT_AMBULATORY_CARE_PROVIDER_SITE_OTHER): Payer: Medicare Other | Admitting: *Deleted

## 2013-10-18 DIAGNOSIS — I498 Other specified cardiac arrhythmias: Secondary | ICD-10-CM | POA: Diagnosis not present

## 2013-10-18 DIAGNOSIS — I5022 Chronic systolic (congestive) heart failure: Secondary | ICD-10-CM

## 2013-10-18 LAB — MDC_IDC_ENUM_SESS_TYPE_INCLINIC
Battery Remaining Longevity: 56.4 mo
Battery Voltage: 3.01 V
Brady Statistic RV Percent Paced: 99.87 %
Date Time Interrogation Session: 20150309161804
Implantable Pulse Generator Model: 3242
Implantable Pulse Generator Serial Number: 7578583
Lead Channel Impedance Value: 437.5 Ohm
Lead Channel Impedance Value: 700 Ohm
Lead Channel Pacing Threshold Amplitude: 0.875 V
Lead Channel Sensing Intrinsic Amplitude: 12 mV
Lead Channel Sensing Intrinsic Amplitude: 3.3 mV
Lead Channel Setting Pacing Amplitude: 1.625
Lead Channel Setting Pacing Amplitude: 2 V
Lead Channel Setting Pacing Pulse Width: 1 ms
Lead Channel Setting Sensing Sensitivity: 2 mV
MDC IDC MSMT LEADCHNL LV PACING THRESHOLD AMPLITUDE: 2.375 V
MDC IDC MSMT LEADCHNL LV PACING THRESHOLD PULSEWIDTH: 1 ms
MDC IDC MSMT LEADCHNL RA IMPEDANCE VALUE: 450 Ohm
MDC IDC MSMT LEADCHNL RA PACING THRESHOLD AMPLITUDE: 0.625 V
MDC IDC MSMT LEADCHNL RA PACING THRESHOLD PULSEWIDTH: 0.4 ms
MDC IDC MSMT LEADCHNL RV PACING THRESHOLD PULSEWIDTH: 0.4 ms
MDC IDC SET LEADCHNL LV PACING AMPLITUDE: 3.375
MDC IDC SET LEADCHNL RV PACING PULSEWIDTH: 0.4 ms
MDC IDC STAT BRADY RA PERCENT PACED: 22 %

## 2013-10-18 NOTE — Progress Notes (Signed)
Wound check appointment. Steri-strips removed. Wound without redness or edema. Incision edges approximated, wound well healed. Normal device function. Thresholds, sensing, and impedances consistent with implant measurements. Device programmed at 3.5V/auto capture programmed on for extra safety margin until 3 month visit. Histogram distribution appropriate for patient and level of activity. 504 mode switches appear to be far field.  No high ventricular rates noted. Patient educated about wound care, arm mobility, lifting restrictions. ROV in 3 months with implanting physician.

## 2013-11-08 ENCOUNTER — Other Ambulatory Visit: Payer: Self-pay | Admitting: Internal Medicine

## 2013-11-16 ENCOUNTER — Encounter: Payer: Self-pay | Admitting: Internal Medicine

## 2013-11-22 ENCOUNTER — Ambulatory Visit (HOSPITAL_COMMUNITY)
Admission: RE | Admit: 2013-11-22 | Discharge: 2013-11-22 | Disposition: A | Discharge status: Home / Self Care | Payer: Medicare Other | Source: Ambulatory Visit | Attending: Internal Medicine | Admitting: Internal Medicine

## 2013-11-22 VITALS — BP 92/50 | HR 85 | Wt 138.5 lb

## 2013-11-22 DIAGNOSIS — I5022 Chronic systolic (congestive) heart failure: Secondary | ICD-10-CM

## 2013-11-22 DIAGNOSIS — I2510 Atherosclerotic heart disease of native coronary artery without angina pectoris: Secondary | ICD-10-CM | POA: Diagnosis not present

## 2013-11-22 NOTE — Progress Notes (Signed)
Patient ID: [name] [name], female   DOB: 02-04-1934, 80 y.o.   MRN: [id number] Referring Physician: Temple [name]  Primary Care: Dr. Sanda [name]  Weight Range: 141 pounds Baseline proBNP: 674 on 07/30/12  HPI: [name] is a 80 y.o. AAF with history of diabetes, hypothyroidism, hypertension and newly diagnosed mixed diastolic and systolic heart failure during hospitalization on 07/30/12, EF 20-25%.  She is a retired [name] [name].  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   She was evaluated by her PCP 12/13 for progressive dyspnea.  EKG showed LBBB therefore she was sent to the ER and placed on BiPap.  She had an elevated d-dimer but CTA chest neg for PE.  She was admitted for respiratory failure and found to have depressed LVEF of 20-25%.  She was diuresed and stay was c/b hypotension with acute renal failure requiring short coarse of dopamine.  Discharge Cr 0.77 (peaked 3.11).  Discharge weight 141 pounds.    Underwent Myoview 08/17/12: Overall Impression: High risk stress nuclear study. No ischemia or scar seen. Normal apical thinning is present. Severe LV systolic dysfunction. LV Ejection Fraction: 27%. LV Wall Motion: Severe global hypokinesis.   L & RHC 09/01/12 RA = 2  RV = 37/2/4  PA = 39/12 (22)  PCW = 11  Fick cardiac output/index = 3.0/1.8  Thermo CO/CI = 2.7/1.6  PVR = 4.9 Woods (Fick)  SVR = 2259  FA sat = 98%  PA sat = 57%, 59%  Ao Pressure: 135/57 (88)  LV Pressure: 136/15/20  There was no signficant gradient across the aortic valve on pullback.  Left main: Normal  LAD: Large vessel wraps apex. Gives off multiple small diagonals. 40% lesion in mid LAD. Mild plaque in apical LAD.  LCX: Nondominant. Mild to moderate non-obstructive plaque in mid vessel. Small OM-1 which is OK. Large OM-2 with tandem 90% lesions in very proximal segment. Large OM-3 with long 70-80 lesion in proximal segment.  RCA: Dominant. Proximal and mid vessel heavily calcified with diffuse 40% lesion.   LV-gram done in the RAO projection: Ejection fraction = 25-30%. Global HK. Mild MR.  CAD treated medically as she was not having CP and LV dysfunction felt to be out of proportion to CAD.  ECHO -03/18/13  EF 20-25% with dysynchrony. Underwent implant of St Jude CRT-D 10/01/13  She returns for follow up. Feel pretty good. Doesn't really notice a difference with CRT. Feels sluggish at times. Denies SOB or CP. No edema, orthopnea or PND. Goes to store and can clean up around church. Has to go slow. Occasional dizziness on standing. Weight at home stable 133.   Labs 04/08/13 K 4.1 Creatinine 0.9 Hemoglobin A1C 12.6 Cholesterol 187 Triglycerides 362 HDL 40     Review of Systems: All pertinent positives and negatives as in HPI, otherwise negative.   Past Medical History  Diagnosis Date  . Hypothyroidism   . High cholesterol   . Osteoarthritis   . CHF (congestive heart failure)   . Congestive dilated cardiomyopathy 08/02/2012  . Left bundle branch block 07/01/2013  . Diabetes mellitus     TYPE 2  . GERD (gastroesophageal reflux disease)     Current Outpatient Prescriptions  Medication Sig Dispense Refill  . aspirin EC 81 MG EC tablet Take 1 tablet (81 mg total) by mouth daily.  30 tablet  0  . atorvastatin (LIPITOR) 40 MG tablet Take 1 tablet (40 mg total) by mouth daily.  90 tablet  3  . carvedilol (  COREG) 12.5 MG tablet Take 1 tablet (12.5 mg total) by mouth 2 (two) times daily with a meal.  180 tablet  6  . ergocalciferol (VITAMIN D2) 50000 UNITS capsule Take 1 capsule (50,000 Units total) by mouth once a week.  12 capsule  3  . furosemide (LASIX) 20 MG tablet Take 1 tablet (20 mg total) by mouth daily.  90 tablet  3  . glipiZIDE (GLUCOTROL) 10 MG tablet Take 1 tablet (10 mg total) by mouth 2 (two) times daily before a meal.  180 tablet  1  . HYDROcodone-acetaminophen (NORCO/VICODIN) 5-325 MG per tablet Take 1 tablet by mouth every 6 (six) hours as needed (pain).      Marland Kitchen levothyroxine  (SYNTHROID, LEVOTHROID) 100 MCG tablet Take 100 mcg by mouth daily before breakfast.      . lisinopril (PRINIVIL,ZESTRIL) 2.5 MG tablet Take 2.5 mg by mouth daily.      . metFORMIN (GLUCOPHAGE XR) 750 MG 24 hr tablet Take 1 tablet (750 mg total) by mouth daily with breakfast.  90 tablet  1   No current facility-administered medications for this encounter.    Allergies  Allergen Reactions  . Statins    PHYSICAL EXAM: Filed Vitals:   11/22/13 1133  BP: 92/50  Pulse: 85  Weight: 138 lb 8 oz (62.823 kg)  SpO2: 96%   General:  Elderly Well appearing. No respiratory difficulty  HEENT: normal Neck: supple. JVP 5-6 Carotids 2+ bilat; no bruits. No lymphadenopathy or thryomegaly appreciated. Cor: PMI nondisplaced. Regular rate & rhythm. No rubs,  Murmurs. ICD site looks good.  Lungs: clear Abdomen: soft, nontender, nondistended. No hepatosplenomegaly. No bruits or masses. Good bowel sounds. Extremities: no cyanosis, clubbing, rash, edema.   Neuro: alert & oriented x 3, cranial nerves grossly intact. moves all 4 extremities w/o difficulty. Affect pleasant.  ASSESSMENT & PLAN: 1) Chronic systolic HF, 03/2013 EF 20-25%. S/p St Jude CRT-D 2/15 --NYHA II-III. Volume status looks good. - Continue carvedilol to 12.5 mg BID.  -Continue lisinopril 2.5 mg in am daily. Unable to add 2.5 mg in pm or spiro  due to dizziness.   - Reinforced need for daily weights and reviewed use of sliding scale diuretics.  2) CAD - Continue ASA, not on statin d/t previous intolerance - no signs of ischemia 3) LBBB, chronic 4) Uncontrolled DM.   Followed by Dr Yetta Barre.  5) Hyperlipidemia- Per Dr Yetta Barre. On lipitor 40 mg daily  Follow up in 2-3 months with Echo    [name] Buckles Reakwon Barren,MD 11:43 AM

## 2013-11-22 NOTE — Addendum Note (Signed)
Encounter addended by: Noralee Space, RN on: 11/22/2013 11:52 AM<BR>     Documentation filed: Patient Instructions Section

## 2013-11-22 NOTE — Patient Instructions (Signed)
We will contact you in 3 months to schedule your next appointment and echocardiogram  

## 2013-12-03 ENCOUNTER — Emergency Department (HOSPITAL_COMMUNITY): Payer: Medicare Other

## 2013-12-03 ENCOUNTER — Encounter (HOSPITAL_COMMUNITY): Payer: Self-pay | Admitting: Emergency Medicine

## 2013-12-03 ENCOUNTER — Inpatient Hospital Stay (HOSPITAL_COMMUNITY)
Admission: EM | Admit: 2013-12-03 | Discharge: 2013-12-07 | DRG: 493 | Disposition: A | Discharge status: Skilled Nursing Facility | Payer: Medicare Other | Attending: Specialist | Admitting: Specialist

## 2013-12-03 DIAGNOSIS — E039 Hypothyroidism, unspecified: Secondary | ICD-10-CM | POA: Diagnosis not present

## 2013-12-03 DIAGNOSIS — S82209A Unspecified fracture of shaft of unspecified tibia, initial encounter for closed fracture: Secondary | ICD-10-CM | POA: Diagnosis present

## 2013-12-03 DIAGNOSIS — Z8049 Family history of malignant neoplasm of other genital organs: Secondary | ICD-10-CM

## 2013-12-03 DIAGNOSIS — I10 Essential (primary) hypertension: Secondary | ICD-10-CM | POA: Diagnosis present

## 2013-12-03 DIAGNOSIS — M6281 Muscle weakness (generalized): Secondary | ICD-10-CM | POA: Diagnosis not present

## 2013-12-03 DIAGNOSIS — I5042 Chronic combined systolic (congestive) and diastolic (congestive) heart failure: Secondary | ICD-10-CM | POA: Diagnosis not present

## 2013-12-03 DIAGNOSIS — Z7982 Long term (current) use of aspirin: Secondary | ICD-10-CM | POA: Diagnosis not present

## 2013-12-03 DIAGNOSIS — Z833 Family history of diabetes mellitus: Secondary | ICD-10-CM | POA: Diagnosis not present

## 2013-12-03 DIAGNOSIS — E1149 Type 2 diabetes mellitus with other diabetic neurological complication: Secondary | ICD-10-CM | POA: Diagnosis not present

## 2013-12-03 DIAGNOSIS — I420 Dilated cardiomyopathy: Secondary | ICD-10-CM | POA: Diagnosis present

## 2013-12-03 DIAGNOSIS — I5022 Chronic systolic (congestive) heart failure: Secondary | ICD-10-CM | POA: Diagnosis not present

## 2013-12-03 DIAGNOSIS — S82143A Displaced bicondylar fracture of unspecified tibia, initial encounter for closed fracture: Secondary | ICD-10-CM | POA: Diagnosis present

## 2013-12-03 DIAGNOSIS — I2510 Atherosclerotic heart disease of native coronary artery without angina pectoris: Secondary | ICD-10-CM | POA: Diagnosis present

## 2013-12-03 DIAGNOSIS — Z5189 Encounter for other specified aftercare: Secondary | ICD-10-CM | POA: Diagnosis not present

## 2013-12-03 DIAGNOSIS — R269 Unspecified abnormalities of gait and mobility: Secondary | ICD-10-CM | POA: Diagnosis not present

## 2013-12-03 DIAGNOSIS — I428 Other cardiomyopathies: Secondary | ICD-10-CM | POA: Diagnosis not present

## 2013-12-03 DIAGNOSIS — Z4801 Encounter for change or removal of surgical wound dressing: Secondary | ICD-10-CM | POA: Diagnosis not present

## 2013-12-03 DIAGNOSIS — S82009A Unspecified fracture of unspecified patella, initial encounter for closed fracture: Secondary | ICD-10-CM | POA: Diagnosis not present

## 2013-12-03 DIAGNOSIS — Z8249 Family history of ischemic heart disease and other diseases of the circulatory system: Secondary | ICD-10-CM | POA: Diagnosis not present

## 2013-12-03 DIAGNOSIS — K219 Gastro-esophageal reflux disease without esophagitis: Secondary | ICD-10-CM | POA: Diagnosis not present

## 2013-12-03 DIAGNOSIS — S6980XA Other specified injuries of unspecified wrist, hand and finger(s), initial encounter: Secondary | ICD-10-CM | POA: Diagnosis not present

## 2013-12-03 DIAGNOSIS — Z79899 Other long term (current) drug therapy: Secondary | ICD-10-CM

## 2013-12-03 DIAGNOSIS — D62 Acute posthemorrhagic anemia: Secondary | ICD-10-CM | POA: Diagnosis not present

## 2013-12-03 DIAGNOSIS — Z950 Presence of cardiac pacemaker: Secondary | ICD-10-CM | POA: Diagnosis not present

## 2013-12-03 DIAGNOSIS — W010XXA Fall on same level from slipping, tripping and stumbling without subsequent striking against object, initial encounter: Secondary | ICD-10-CM | POA: Diagnosis present

## 2013-12-03 DIAGNOSIS — R279 Unspecified lack of coordination: Secondary | ICD-10-CM | POA: Diagnosis not present

## 2013-12-03 DIAGNOSIS — E1140 Type 2 diabetes mellitus with diabetic neuropathy, unspecified: Secondary | ICD-10-CM | POA: Diagnosis present

## 2013-12-03 DIAGNOSIS — S6990XA Unspecified injury of unspecified wrist, hand and finger(s), initial encounter: Secondary | ICD-10-CM | POA: Diagnosis not present

## 2013-12-03 DIAGNOSIS — Z01818 Encounter for other preprocedural examination: Secondary | ICD-10-CM | POA: Diagnosis not present

## 2013-12-03 DIAGNOSIS — S82109A Unspecified fracture of upper end of unspecified tibia, initial encounter for closed fracture: Principal | ICD-10-CM | POA: Diagnosis present

## 2013-12-03 DIAGNOSIS — E7800 Pure hypercholesterolemia, unspecified: Secondary | ICD-10-CM | POA: Diagnosis present

## 2013-12-03 DIAGNOSIS — IMO0002 Reserved for concepts with insufficient information to code with codable children: Secondary | ICD-10-CM | POA: Diagnosis not present

## 2013-12-03 DIAGNOSIS — E1142 Type 2 diabetes mellitus with diabetic polyneuropathy: Secondary | ICD-10-CM | POA: Diagnosis present

## 2013-12-03 DIAGNOSIS — M25569 Pain in unspecified knee: Secondary | ICD-10-CM | POA: Diagnosis not present

## 2013-12-03 DIAGNOSIS — M79609 Pain in unspecified limb: Secondary | ICD-10-CM | POA: Diagnosis not present

## 2013-12-03 DIAGNOSIS — S82121A Displaced fracture of lateral condyle of right tibia, initial encounter for closed fracture: Secondary | ICD-10-CM | POA: Diagnosis present

## 2013-12-03 DIAGNOSIS — Z9181 History of falling: Secondary | ICD-10-CM | POA: Diagnosis not present

## 2013-12-03 DIAGNOSIS — S298XXA Other specified injuries of thorax, initial encounter: Secondary | ICD-10-CM | POA: Diagnosis not present

## 2013-12-03 DIAGNOSIS — I509 Heart failure, unspecified: Secondary | ICD-10-CM | POA: Diagnosis present

## 2013-12-03 DIAGNOSIS — E785 Hyperlipidemia, unspecified: Secondary | ICD-10-CM | POA: Diagnosis present

## 2013-12-03 DIAGNOSIS — S8290XD Unspecified fracture of unspecified lower leg, subsequent encounter for closed fracture with routine healing: Secondary | ICD-10-CM | POA: Diagnosis not present

## 2013-12-03 DIAGNOSIS — Z01810 Encounter for preprocedural cardiovascular examination: Secondary | ICD-10-CM | POA: Diagnosis not present

## 2013-12-03 DIAGNOSIS — I447 Left bundle-branch block, unspecified: Secondary | ICD-10-CM | POA: Diagnosis present

## 2013-12-03 DIAGNOSIS — S82141A Displaced bicondylar fracture of right tibia, initial encounter for closed fracture: Secondary | ICD-10-CM | POA: Diagnosis present

## 2013-12-03 DIAGNOSIS — S82409A Unspecified fracture of shaft of unspecified fibula, initial encounter for closed fracture: Secondary | ICD-10-CM | POA: Diagnosis present

## 2013-12-03 DIAGNOSIS — M7989 Other specified soft tissue disorders: Secondary | ICD-10-CM | POA: Diagnosis not present

## 2013-12-03 HISTORY — DX: Chronic combined systolic (congestive) and diastolic (congestive) heart failure: I50.42

## 2013-12-03 HISTORY — DX: Atherosclerotic heart disease of native coronary artery without angina pectoris: I25.10

## 2013-12-03 HISTORY — DX: Type 2 diabetes mellitus without complications: E11.9

## 2013-12-03 LAB — BASIC METABOLIC PANEL
BUN: 11 mg/dL (ref 6–23)
CALCIUM: 10.3 mg/dL (ref 8.4–10.5)
CO2: 25 meq/L (ref 19–32)
Chloride: 99 mEq/L (ref 96–112)
Creatinine, Ser: 0.83 mg/dL (ref 0.50–1.10)
GFR calc Af Amer: 77 mL/min — ABNORMAL LOW (ref >90)
GFR calc non Af Amer: 66 mL/min — ABNORMAL LOW (ref >90)
GLUCOSE: 364 mg/dL — AB (ref 70–99)
Potassium: 4.5 mEq/L (ref 3.7–5.3)
SODIUM: 138 meq/L (ref 137–147)

## 2013-12-03 LAB — CBC WITH DIFFERENTIAL/PLATELET
Basophils Absolute: 0 10*3/uL (ref 0.0–0.1)
Basophils Relative: 0 % (ref 0–1)
EOS ABS: 0.2 10*3/uL (ref 0.0–0.7)
Eosinophils Relative: 2 % (ref 0–5)
HCT: 38.9 % (ref 36.0–46.0)
Hemoglobin: 12.7 g/dL (ref 12.0–15.0)
Lymphocytes Relative: 38 % (ref 12–46)
Lymphs Abs: 2.5 10*3/uL (ref 0.7–4.0)
MCH: 26.3 pg (ref 26.0–34.0)
MCHC: 32.6 g/dL (ref 30.0–36.0)
MCV: 80.7 fL (ref 78.0–100.0)
MONOS PCT: 5 % (ref 3–12)
Monocytes Absolute: 0.3 10*3/uL (ref 0.1–1.0)
Neutro Abs: 3.6 10*3/uL (ref 1.7–7.7)
Neutrophils Relative %: 55 % (ref 43–77)
PLATELETS: 182 10*3/uL (ref 150–400)
RBC: 4.82 MIL/uL (ref 3.87–5.11)
RDW: 13.9 % (ref 11.5–15.5)
WBC: 6.6 10*3/uL (ref 4.0–10.5)

## 2013-12-03 LAB — PROTIME-INR
INR: 0.98 (ref 0.00–1.49)
Prothrombin Time: 12.8 seconds (ref 11.6–15.2)

## 2013-12-03 LAB — PRO B NATRIURETIC PEPTIDE: Pro B Natriuretic peptide (BNP): 217.8 pg/mL (ref 0–450)

## 2013-12-03 LAB — GLUCOSE, CAPILLARY
GLUCOSE-CAPILLARY: 167 mg/dL — AB (ref 70–99)
Glucose-Capillary: 199 mg/dL — ABNORMAL HIGH (ref 70–99)

## 2013-12-03 LAB — CBG MONITORING, ED: GLUCOSE-CAPILLARY: 291 mg/dL — AB (ref 70–99)

## 2013-12-03 MED ORDER — INSULIN ASPART 100 UNIT/ML ~~LOC~~ SOLN
0.0000 [IU] | SUBCUTANEOUS | Status: DC
Start: 2013-12-03 — End: 2013-12-04
  Administered 2013-12-03 (×2): 2 [IU] via SUBCUTANEOUS
  Administered 2013-12-03: 5 [IU] via SUBCUTANEOUS
  Administered 2013-12-04: 3 [IU] via SUBCUTANEOUS
  Administered 2013-12-04 (×2): 2 [IU] via SUBCUTANEOUS
  Filled 2013-12-03: qty 1

## 2013-12-03 MED ORDER — POLYVINYL ALCOHOL 1.4 % OP SOLN
1.0000 [drp] | Freq: Every day | OPHTHALMIC | Status: DC
  Administered 2013-12-05 – 2013-12-06 (×2): 1 [drp] via OPHTHALMIC
  Filled 2013-12-03: qty 15

## 2013-12-03 MED ORDER — FUROSEMIDE 20 MG PO TABS
20.0000 mg | ORAL_TABLET | ORAL | Status: DC
  Administered 2013-12-03 – 2013-12-05 (×2): 20 mg via ORAL
  Filled 2013-12-03 (×3): qty 1

## 2013-12-03 MED ORDER — LEVOTHYROXINE SODIUM 100 MCG PO TABS
100.0000 ug | ORAL_TABLET | Freq: Every day | ORAL | Status: DC
  Administered 2013-12-04 – 2013-12-07 (×4): 100 ug via ORAL
  Filled 2013-12-03 (×5): qty 1

## 2013-12-03 MED ORDER — HYDROCODONE-ACETAMINOPHEN 5-325 MG PO TABS
1.0000 | ORAL_TABLET | Freq: Four times a day (QID) | ORAL | Status: DC | PRN
  Administered 2013-12-03 – 2013-12-04 (×3): 1 via ORAL
  Filled 2013-12-03 (×3): qty 1

## 2013-12-03 MED ORDER — MORPHINE SULFATE 2 MG/ML IJ SOLN: 1.0000 mg | INTRAMUSCULAR | Status: DC | PRN

## 2013-12-03 MED ORDER — METFORMIN HCL ER 750 MG PO TB24
750.0000 mg | ORAL_TABLET | Freq: Every day | ORAL | Status: DC
  Filled 2013-12-03 (×2): qty 1

## 2013-12-03 MED ORDER — CARVEDILOL 12.5 MG PO TABS
12.5000 mg | ORAL_TABLET | Freq: Two times a day (BID) | ORAL | Status: DC
  Administered 2013-12-04 – 2013-12-07 (×6): 12.5 mg via ORAL
  Filled 2013-12-03 (×9): qty 1

## 2013-12-03 MED ORDER — ENOXAPARIN SODIUM 30 MG/0.3ML ~~LOC~~ SOLN
30.0000 mg | SUBCUTANEOUS | Status: DC
  Administered 2013-12-03: 30 mg via SUBCUTANEOUS
  Filled 2013-12-03 (×2): qty 0.3

## 2013-12-03 MED ORDER — LISINOPRIL 2.5 MG PO TABS
2.5000 mg | ORAL_TABLET | Freq: Every day | ORAL | Status: DC
  Administered 2013-12-03 – 2013-12-07 (×4): 2.5 mg via ORAL
  Filled 2013-12-03 (×5): qty 1

## 2013-12-03 MED ORDER — GLIPIZIDE 10 MG PO TABS
10.0000 mg | ORAL_TABLET | Freq: Two times a day (BID) | ORAL | Status: DC
  Filled 2013-12-03 (×3): qty 1

## 2013-12-03 MED ORDER — HYDROCODONE-ACETAMINOPHEN 5-325 MG PO TABS: 1.0000 | ORAL_TABLET | ORAL | Status: DC | PRN

## 2013-12-03 MED ORDER — MORPHINE SULFATE 2 MG/ML IJ SOLN: 0.5000 mg | INTRAMUSCULAR | Status: DC | PRN

## 2013-12-03 MED ORDER — KCL IN DEXTROSE-NACL 10-5-0.45 MEQ/L-%-% IV SOLN
INTRAVENOUS | Status: AC
  Administered 2013-12-03: 20:00:00 via INTRAVENOUS
  Filled 2013-12-03 (×2): qty 1000

## 2013-12-03 MED ORDER — VITAMIN D (ERGOCALCIFEROL) 1.25 MG (50000 UNIT) PO CAPS
50000.0000 [IU] | ORAL_CAPSULE | ORAL | Status: DC
  Administered 2013-12-06: 50000 [IU] via ORAL
  Filled 2013-12-03: qty 1

## 2013-12-03 NOTE — ED Notes (Signed)
Pt's son Selena Batten: [phone]
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
## 2013-12-03 NOTE — ED Notes (Signed)
Pt out of room for x-ray 

## 2013-12-03 NOTE — ED Notes (Signed)
Patient transported to X-ray 
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
## 2013-12-03 NOTE — ED Notes (Signed)
MD at bedside. 

## 2013-12-03 NOTE — ED Notes (Signed)
CBG 291, per Denisa EMT

## 2013-12-04 ENCOUNTER — Inpatient Hospital Stay (HOSPITAL_COMMUNITY): Payer: Medicare Other | Admitting: Anesthesiology

## 2013-12-04 ENCOUNTER — Encounter (HOSPITAL_COMMUNITY): Payer: Medicare Other | Admitting: Anesthesiology

## 2013-12-04 ENCOUNTER — Encounter (HOSPITAL_COMMUNITY)
Admission: EM | Disposition: A | Discharge status: Skilled Nursing Facility | Payer: Self-pay | Source: Home / Self Care | Attending: Specialist

## 2013-12-04 ENCOUNTER — Encounter (HOSPITAL_COMMUNITY): Payer: Self-pay | Admitting: Internal Medicine

## 2013-12-04 ENCOUNTER — Inpatient Hospital Stay (HOSPITAL_COMMUNITY): Payer: Medicare Other

## 2013-12-04 DIAGNOSIS — E785 Hyperlipidemia, unspecified: Secondary | ICD-10-CM

## 2013-12-04 DIAGNOSIS — I428 Other cardiomyopathies: Secondary | ICD-10-CM

## 2013-12-04 DIAGNOSIS — I2510 Atherosclerotic heart disease of native coronary artery without angina pectoris: Secondary | ICD-10-CM | POA: Diagnosis not present

## 2013-12-04 DIAGNOSIS — Z01810 Encounter for preprocedural cardiovascular examination: Secondary | ICD-10-CM | POA: Diagnosis not present

## 2013-12-04 DIAGNOSIS — S82143A Displaced bicondylar fracture of unspecified tibia, initial encounter for closed fracture: Secondary | ICD-10-CM | POA: Diagnosis present

## 2013-12-04 DIAGNOSIS — Z01818 Encounter for other preprocedural examination: Secondary | ICD-10-CM

## 2013-12-04 DIAGNOSIS — I10 Essential (primary) hypertension: Secondary | ICD-10-CM

## 2013-12-04 DIAGNOSIS — S82109A Unspecified fracture of upper end of unspecified tibia, initial encounter for closed fracture: Principal | ICD-10-CM

## 2013-12-04 DIAGNOSIS — I447 Left bundle-branch block, unspecified: Secondary | ICD-10-CM

## 2013-12-04 DIAGNOSIS — E1149 Type 2 diabetes mellitus with other diabetic neurological complication: Secondary | ICD-10-CM | POA: Diagnosis not present

## 2013-12-04 DIAGNOSIS — I5022 Chronic systolic (congestive) heart failure: Secondary | ICD-10-CM

## 2013-12-04 DIAGNOSIS — IMO0002 Reserved for concepts with insufficient information to code with codable children: Secondary | ICD-10-CM | POA: Diagnosis not present

## 2013-12-04 DIAGNOSIS — I5042 Chronic combined systolic (congestive) and diastolic (congestive) heart failure: Secondary | ICD-10-CM | POA: Diagnosis not present

## 2013-12-04 DIAGNOSIS — K219 Gastro-esophageal reflux disease without esophagitis: Secondary | ICD-10-CM | POA: Diagnosis not present

## 2013-12-04 HISTORY — PX: ORIF TIBIA PLATEAU: SHX2132

## 2013-12-04 LAB — CBC
HCT: 32.8 % — ABNORMAL LOW (ref 36.0–46.0)
Hemoglobin: 10.8 g/dL — ABNORMAL LOW (ref 12.0–15.0)
MCH: 26.5 pg (ref 26.0–34.0)
MCHC: 32.9 g/dL (ref 30.0–36.0)
MCV: 80.6 fL (ref 78.0–100.0)
PLATELETS: 188 10*3/uL (ref 150–400)
RBC: 4.07 MIL/uL (ref 3.87–5.11)
RDW: 14.1 % (ref 11.5–15.5)
WBC: 8.4 10*3/uL (ref 4.0–10.5)

## 2013-12-04 LAB — SURGICAL PCR SCREEN
MRSA, PCR: NEGATIVE
STAPHYLOCOCCUS AUREUS: NEGATIVE

## 2013-12-04 LAB — GLUCOSE, CAPILLARY
Glucose-Capillary: 176 mg/dL — ABNORMAL HIGH (ref 70–99)
Glucose-Capillary: 190 mg/dL — ABNORMAL HIGH (ref 70–99)
Glucose-Capillary: 212 mg/dL — ABNORMAL HIGH (ref 70–99)
Glucose-Capillary: 214 mg/dL — ABNORMAL HIGH (ref 70–99)
Glucose-Capillary: 151 mg/dL — ABNORMAL HIGH (ref 70–99)
Glucose-Capillary: 151 mg/dL — ABNORMAL HIGH (ref 70–99)
Glucose-Capillary: 157 mg/dL — ABNORMAL HIGH (ref 70–99)

## 2013-12-04 LAB — HEMOGLOBIN A1C
Hgb A1c MFr Bld: 13.1 % — ABNORMAL HIGH (ref <5.7)
Mean Plasma Glucose: 329 mg/dL — ABNORMAL HIGH (ref <117)

## 2013-12-04 LAB — TROPONIN I

## 2013-12-04 SURGERY — OPEN REDUCTION INTERNAL FIXATION (ORIF) TIBIAL PLATEAU
Anesthesia: General | Laterality: Right

## 2013-12-04 MED ORDER — ONDANSETRON HCL 4 MG PO TABS: 4.0000 mg | ORAL_TABLET | Freq: Four times a day (QID) | ORAL | Status: DC | PRN

## 2013-12-04 MED ORDER — CEFAZOLIN SODIUM-DEXTROSE 2-3 GM-% IV SOLR
INTRAVENOUS | Status: AC
  Filled 2013-12-04: qty 50

## 2013-12-04 MED ORDER — PHENYLEPHRINE HCL 10 MG/ML IJ SOLN
10.0000 mg | INTRAVENOUS | Status: DC | PRN
  Administered 2013-12-04: 15 ug/min via INTRAVENOUS

## 2013-12-04 MED ORDER — INSULIN ASPART 100 UNIT/ML ~~LOC~~ SOLN
SUBCUTANEOUS | Status: AC
  Filled 2013-12-04: qty 1

## 2013-12-04 MED ORDER — 0.9 % SODIUM CHLORIDE (POUR BTL) OPTIME
TOPICAL | Status: DC | PRN
  Administered 2013-12-04: 1000 mL

## 2013-12-04 MED ORDER — LACTATED RINGERS IV SOLN: INTRAVENOUS | Status: DC

## 2013-12-04 MED ORDER — SODIUM CHLORIDE 0.9 % IV SOLN
INTRAVENOUS | Status: DC
  Administered 2013-12-05: via INTRAVENOUS

## 2013-12-04 MED ORDER — ACETAMINOPHEN 650 MG RE SUPP: 650.0000 mg | Freq: Four times a day (QID) | RECTAL | Status: DC | PRN

## 2013-12-04 MED ORDER — ONDANSETRON HCL 4 MG/2ML IJ SOLN: 4.0000 mg | Freq: Once | INTRAMUSCULAR | Status: DC | PRN

## 2013-12-04 MED ORDER — SUCCINYLCHOLINE CHLORIDE 20 MG/ML IJ SOLN
INTRAMUSCULAR | Status: DC | PRN
  Administered 2013-12-04: 100 mg via INTRAVENOUS

## 2013-12-04 MED ORDER — CEFAZOLIN SODIUM-DEXTROSE 2-3 GM-% IV SOLR
2.0000 g | INTRAVENOUS | Status: AC
  Administered 2013-12-04: 2 g via INTRAVENOUS
  Filled 2013-12-04: qty 50

## 2013-12-04 MED ORDER — FENTANYL CITRATE 0.05 MG/ML IJ SOLN
INTRAMUSCULAR | Status: DC | PRN
  Administered 2013-12-04 (×2): 50 ug via INTRAVENOUS

## 2013-12-04 MED ORDER — INSULIN ASPART 100 UNIT/ML ~~LOC~~ SOLN
0.0000 [IU] | SUBCUTANEOUS | Status: DC
  Administered 2013-12-04: 3 [IU] via SUBCUTANEOUS
  Administered 2013-12-04: 5 [IU] via SUBCUTANEOUS
  Administered 2013-12-04 – 2013-12-05 (×3): 3 [IU] via SUBCUTANEOUS

## 2013-12-04 MED ORDER — PHENYLEPHRINE HCL 10 MG/ML IJ SOLN
INTRAMUSCULAR | Status: AC
  Filled 2013-12-04: qty 1

## 2013-12-04 MED ORDER — FERROUS SULFATE 325 (65 FE) MG PO TABS
325.0000 mg | ORAL_TABLET | Freq: Three times a day (TID) | ORAL | Status: DC
  Administered 2013-12-05 – 2013-12-07 (×7): 325 mg via ORAL
  Filled 2013-12-04 (×10): qty 1

## 2013-12-04 MED ORDER — LIDOCAINE HCL (CARDIAC) 20 MG/ML IV SOLN
INTRAVENOUS | Status: DC | PRN
  Administered 2013-12-04: 50 mg via INTRAVENOUS

## 2013-12-04 MED ORDER — MORPHINE SULFATE 2 MG/ML IJ SOLN
0.5000 mg | INTRAMUSCULAR | Status: DC | PRN
  Administered 2013-12-04 – 2013-12-05 (×2): 0.5 mg via INTRAVENOUS
  Filled 2013-12-04 (×2): qty 1

## 2013-12-04 MED ORDER — ACETAMINOPHEN 160 MG/5ML PO SOLN: 325.0000 mg | ORAL | Status: DC | PRN

## 2013-12-04 MED ORDER — PHENYLEPHRINE 40 MCG/ML (10ML) SYRINGE FOR IV PUSH (FOR BLOOD PRESSURE SUPPORT)
PREFILLED_SYRINGE | INTRAVENOUS | Status: AC
  Filled 2013-12-04: qty 10

## 2013-12-04 MED ORDER — FENTANYL CITRATE 0.05 MG/ML IJ SOLN
INTRAMUSCULAR | Status: AC
  Filled 2013-12-04: qty 2

## 2013-12-04 MED ORDER — CHLORHEXIDINE GLUCONATE 4 % EX LIQD
60.0000 mL | Freq: Once | CUTANEOUS | Status: DC
  Filled 2013-12-04: qty 60

## 2013-12-04 MED ORDER — ROCURONIUM BROMIDE 100 MG/10ML IV SOLN
INTRAVENOUS | Status: DC | PRN
  Administered 2013-12-04: 5 mg via INTRAVENOUS
  Administered 2013-12-04: 20 mg via INTRAVENOUS

## 2013-12-04 MED ORDER — METOCLOPRAMIDE HCL 5 MG/ML IJ SOLN: 5.0000 mg | Freq: Three times a day (TID) | INTRAMUSCULAR | Status: DC | PRN

## 2013-12-04 MED ORDER — ENOXAPARIN SODIUM 40 MG/0.4ML ~~LOC~~ SOLN
40.0000 mg | SUBCUTANEOUS | Status: DC
  Administered 2013-12-05 – 2013-12-07 (×3): 40 mg via SUBCUTANEOUS
  Filled 2013-12-04 (×3): qty 0.4

## 2013-12-04 MED ORDER — LACTATED RINGERS IV SOLN
INTRAVENOUS | Status: DC | PRN
  Administered 2013-12-04: 19:00:00 via INTRAVENOUS

## 2013-12-04 MED ORDER — NEOSTIGMINE METHYLSULFATE 1 MG/ML IJ SOLN
INTRAMUSCULAR | Status: DC | PRN
  Administered 2013-12-04: 3 mg via INTRAVENOUS

## 2013-12-04 MED ORDER — PROPOFOL 10 MG/ML IV BOLUS
INTRAVENOUS | Status: DC | PRN
  Administered 2013-12-04: 120 mg via INTRAVENOUS

## 2013-12-04 MED ORDER — ONDANSETRON HCL 4 MG/2ML IJ SOLN
INTRAMUSCULAR | Status: DC | PRN
  Administered 2013-12-04: 4 mg via INTRAVENOUS

## 2013-12-04 MED ORDER — CEFAZOLIN SODIUM-DEXTROSE 2-3 GM-% IV SOLR
2.0000 g | Freq: Four times a day (QID) | INTRAVENOUS | Status: AC
  Administered 2013-12-05 (×2): 2 g via INTRAVENOUS
  Filled 2013-12-04 (×2): qty 50

## 2013-12-04 MED ORDER — ACETAMINOPHEN 325 MG PO TABS
650.0000 mg | ORAL_TABLET | Freq: Four times a day (QID) | ORAL | Status: DC | PRN
  Administered 2013-12-05 (×2): 650 mg via ORAL
  Filled 2013-12-04 (×2): qty 2

## 2013-12-04 MED ORDER — MENTHOL 3 MG MT LOZG
1.0000 | LOZENGE | OROMUCOSAL | Status: DC | PRN
  Filled 2013-12-04: qty 9

## 2013-12-04 MED ORDER — METOCLOPRAMIDE HCL 10 MG PO TABS: 5.0000 mg | ORAL_TABLET | Freq: Three times a day (TID) | ORAL | Status: DC | PRN

## 2013-12-04 MED ORDER — ONDANSETRON HCL 4 MG/2ML IJ SOLN
INTRAMUSCULAR | Status: AC
  Filled 2013-12-04: qty 2

## 2013-12-04 MED ORDER — FENTANYL CITRATE 0.05 MG/ML IJ SOLN
25.0000 ug | INTRAMUSCULAR | Status: DC | PRN
  Administered 2013-12-04: 25 ug via INTRAVENOUS

## 2013-12-04 MED ORDER — DOCUSATE SODIUM 100 MG PO CAPS
100.0000 mg | ORAL_CAPSULE | Freq: Two times a day (BID) | ORAL | Status: DC
  Administered 2013-12-05 – 2013-12-07 (×5): 100 mg via ORAL

## 2013-12-04 MED ORDER — ACETAMINOPHEN 325 MG PO TABS: 325.0000 mg | ORAL_TABLET | ORAL | Status: DC | PRN

## 2013-12-04 MED ORDER — ONDANSETRON HCL 4 MG/2ML IJ SOLN
4.0000 mg | Freq: Four times a day (QID) | INTRAMUSCULAR | Status: DC | PRN
  Administered 2013-12-05: 4 mg via INTRAVENOUS
  Filled 2013-12-04: qty 2

## 2013-12-04 MED ORDER — PHENYLEPHRINE HCL 10 MG/ML IJ SOLN
INTRAMUSCULAR | Status: DC | PRN
  Administered 2013-12-04: 80 ug via INTRAVENOUS
  Administered 2013-12-04 (×2): 40 ug via INTRAVENOUS
  Administered 2013-12-04 (×3): 80 ug via INTRAVENOUS

## 2013-12-04 MED ORDER — PHENOL 1.4 % MT LIQD
1.0000 | OROMUCOSAL | Status: DC | PRN
  Filled 2013-12-04: qty 177

## 2013-12-04 MED ORDER — HYDROCODONE-ACETAMINOPHEN 5-325 MG PO TABS
1.0000 | ORAL_TABLET | Freq: Four times a day (QID) | ORAL | Status: DC | PRN
  Administered 2013-12-05: 2 via ORAL
  Administered 2013-12-05: 1 via ORAL
  Administered 2013-12-05: 2 via ORAL
  Administered 2013-12-05 – 2013-12-06 (×2): 1 via ORAL
  Administered 2013-12-06: 2 via ORAL
  Administered 2013-12-07 (×3): 1 via ORAL
  Filled 2013-12-04: qty 2
  Filled 2013-12-04: qty 1
  Filled 2013-12-04 (×2): qty 2
  Filled 2013-12-04 (×3): qty 1
  Filled 2013-12-04: qty 2
  Filled 2013-12-04: qty 1

## 2013-12-04 MED ORDER — GLYCOPYRROLATE 0.2 MG/ML IJ SOLN
INTRAMUSCULAR | Status: DC | PRN
  Administered 2013-12-04: 0.4 mg via INTRAVENOUS

## 2013-12-04 SURGICAL SUPPLY — 62 items
BAG SPEC THK2 15X12 ZIP CLS (MISCELLANEOUS) ×1
BAG ZIPLOCK 12X15 (MISCELLANEOUS) ×3 IMPLANT
BANDAGE ELASTIC 6 VELCRO ST LF (GAUZE/BANDAGES/DRESSINGS) ×2 IMPLANT
BANDAGE GAUZE ELAST BULKY 4 IN (GAUZE/BANDAGES/DRESSINGS) ×2 IMPLANT
BIT DRILL 100X2.5XANTM LCK (BIT) IMPLANT
BIT DRILL CAL (BIT) IMPLANT
BIT DRL 100X2.5XANTM LCK (BIT) ×1
BNDG COHESIVE 4X5 TAN STRL (GAUZE/BANDAGES/DRESSINGS) ×3 IMPLANT
BNDG COHESIVE 6X5 TAN STRL LF (GAUZE/BANDAGES/DRESSINGS) ×3 IMPLANT
CHIP CORTICOCANCELLOUS 30CC (Bone Implant) ×6 IMPLANT
CHIPS CORTICOCANCELLOUS 30CC (Bone Implant) ×2 IMPLANT
CLOSURE WOUND 1/2 X4 (GAUZE/BANDAGES/DRESSINGS) ×1
COVER SURGICAL LIGHT HANDLE (MISCELLANEOUS) ×3 IMPLANT
CUFF TOURN SGL QUICK 34 (TOURNIQUET CUFF) ×3
CUFF TRNQT CYL 34X4X40X1 (TOURNIQUET CUFF) ×1 IMPLANT
DRAPE C-ARM 42X120 X-RAY (DRAPES) ×3 IMPLANT
DRAPE INCISE IOBAN 66X45 STRL (DRAPES) ×3 IMPLANT
DRAPE LG THREE QUARTER DISP (DRAPES) ×3 IMPLANT
DRAPE U-SHAPE 47X51 STRL (DRAPES) ×3 IMPLANT
DRILL BIT 2.5MM (BIT) ×3
DRILL BIT CAL (BIT) ×3
DRSG EMULSION OIL 3X16 NADH (GAUZE/BANDAGES/DRESSINGS) ×3 IMPLANT
DURAPREP 26ML APPLICATOR (WOUND CARE) ×3 IMPLANT
ELECT REM PT RETURN 9FT ADLT (ELECTROSURGICAL) ×3
ELECTRODE REM PT RTRN 9FT ADLT (ELECTROSURGICAL) ×1 IMPLANT
GLOVE ORTHO TXT STRL SZ7.5 (GLOVE) ×3 IMPLANT
GLOVE SURG ORTHO 8.5 STRL (GLOVE) ×3 IMPLANT
GOWN STRL REUS W/TWL LRG LVL3 (GOWN DISPOSABLE) ×6 IMPLANT
GRAFT BNE CORT CANC CHIPS 30CC (Bone Implant) IMPLANT
IMMOBILIZER KNEE 20 (SOFTGOODS) ×3 IMPLANT
K-WIRE ACE 1.6X6 (WIRE) ×3
KIT BASIN OR (CUSTOM PROCEDURE TRAY) ×3 IMPLANT
KWIRE ACE 1.6X6 (WIRE) IMPLANT
MANIFOLD NEPTUNE II (INSTRUMENTS) ×3 IMPLANT
NS IRRIG 1000ML POUR BTL (IV SOLUTION) ×3 IMPLANT
PACK LOWER EXTREMITY WL (CUSTOM PROCEDURE TRAY) ×3 IMPLANT
PAD CAST 4YDX4 CTTN HI CHSV (CAST SUPPLIES) ×2 IMPLANT
PADDING CAST COTTON 4X4 STRL (CAST SUPPLIES) ×6
PLATE LOCK 5H STD RT PROX TIB (Plate) ×2 IMPLANT
POSITIONER SURGICAL ARM (MISCELLANEOUS) ×3 IMPLANT
SCREW CORTICAL 3.5MM 38MM (Screw) ×4 IMPLANT
SCREW CORTICAL 3.5MM 40MM (Screw) ×2 IMPLANT
SCREW LOCK CORT STAR 3.5X52 (Screw) ×2 IMPLANT
SCREW LOCK CORT STAR 3.5X54 (Screw) ×2 IMPLANT
SCREW LOCK CORT STAR 3.5X60 (Screw) ×2 IMPLANT
SCREW LOCK CORT STAR 3.5X65 (Screw) ×4 IMPLANT
SCREW LOCK CORT STAR 3.5X70 (Screw) ×8 IMPLANT
SET PAD KNEE POSITIONER (MISCELLANEOUS) ×3 IMPLANT
SPONGE GAUZE 4X4 12PLY (GAUZE/BANDAGES/DRESSINGS) ×4 IMPLANT
SPONGE LAP 18X18 X RAY DECT (DISPOSABLE) ×6 IMPLANT
STAPLER VISISTAT 35W (STAPLE) ×2 IMPLANT
STOCKINETTE 8 INCH (MISCELLANEOUS) ×3 IMPLANT
STRIP CLOSURE SKIN 1/2X4 (GAUZE/BANDAGES/DRESSINGS) ×2 IMPLANT
SUCTION FRAZIER TIP 10 FR DISP (SUCTIONS) ×3 IMPLANT
SUT ETHIBOND NAB CT1 #1 30IN (SUTURE) ×2 IMPLANT
SUT MNCRL AB 4-0 PS2 18 (SUTURE) ×1 IMPLANT
SUT VIC AB 0 CT1 36 (SUTURE) ×4 IMPLANT
SUT VIC AB 1 CT1 36 (SUTURE) ×2 IMPLANT
SUT VIC AB 2-0 CT1 27 (SUTURE) ×6
SUT VIC AB 2-0 CT1 TAPERPNT 27 (SUTURE) ×1 IMPLANT
TOWEL OR 17X26 10 PK STRL BLUE (TOWEL DISPOSABLE) ×6 IMPLANT
WATER STERILE IRR 1500ML POUR (IV SOLUTION) ×3 IMPLANT

## 2013-12-04 NOTE — Progress Notes (Signed)
   Subjective:     Recheck right leg s/p lateral tibial plateau fracture Pt states no pain as long as she does not move her leg Currently in no pain with immobilizer in place Plan for ORIF possibly today  Patient reports pain as mild.  Objective:   VITALS:   Filed Vitals:   12/04/13 0518  BP: 91/56  Pulse: 66  Temp: 98.1 F (36.7 C)  Resp: 18    Right lower extremity currently in knee immobilizer nv intact distally No rashes or edema No movement on exam today  LABS  Recent Labs  12/03/13 1133 12/04/13 0510  HGB 12.7 10.8*  HCT 38.9 32.8*  WBC 6.6 8.4  PLT 182 188     Recent Labs  12/03/13 1133  NA 138  K 4.5  BUN 11  CREATININE 0.83  GLUCOSE 364*     Assessment/Plan:     Right lateral tibial plateau fracture Will discuss case with both Dr. Shelle Iron and Dr. Ranell Patrick to determine when and who will perform needed ORIF of right tibia Pt currently comfortable with current pain regimen Strict non weight bearing right lower extremity and bed rest      Brad Elfego Giammarino, MPAS, PA-C  12/04/2013, 7:11 AM

## 2013-12-04 NOTE — Progress Notes (Signed)
Pt taken to OR via bed. Handed off to RN in holding room. [employer]

## 2013-12-04 NOTE — Consult Note (Signed)
Primary Care Physician: Sanda Linger, MD Referring Physician:  [facility]   [name] [name] is a 80 y.o. female with a h/o CAD and a dilated CM (EF 25-30%) who is admitted with R tibial plateau fracture following a mechanical fall.  She has done well from a CV standpoint recently.  She underwent biventricular pacemaker implantation by Dr Graciela Husbands 2/15.  This procedure was uneventful.  She has done well since, without symptoms of palpitations, chest pain, shortness of breath, orthopnea, PND, lower extremity edema, presyncope, or syncope.  She has occasional postural dizziness which is mild.  The patient is tolerating medications without difficulties and is otherwise without complaint today.   Past Medical History  Diagnosis Date  . Hypothyroidism   . High cholesterol   . Osteoarthritis   . Chronic combined systolic and diastolic CHF, NYHA class 3     a. 03/2013 Echo: EF 25-30%, diff HK, sev antsept HK, mild MR.  . Congestive dilated cardiomyopathy     a. 03/2013 Echo: EF 25-30%;  b. 09/2013 s/p [facility] 3242 CRT-P.  Marland Kitchen Left bundle branch block   . Type II diabetes mellitus   . GERD (gastroesophageal reflux disease)   . Coronary artery disease     a. 08/2012 Cath: LM nl, LAD 24m, LCX nondom, mild-mod nonobs dzs mid, OM1 ok, OM2 90/90p, RCA 40, EF 25-30%-->Med Rx.   Past Surgical History  Procedure Laterality Date  . Abdominal hysterectomy  1980  . Bmd  2006  . Biventricular pacemaker implantation  10/01/2013    SJM DR Graciela Husbands  . Bilateral cateract surgery    . Bi-ventricular pacemaker insertion (crt-p)      a. 09/2013 s/p SJM 3242 CRT-P.    Current Facility-Administered Medications  Medication Dose Route Frequency Provider Last Rate Last Dose  . carvedilol (COREG) tablet 12.5 mg  12.5 mg Oral BID WC Dayna Barker. Bissell, PA-C      . dextrose 5 % and 0.45 % NaCl with KCl 10 mEq/L infusion   Intravenous Continuous Javier Docker, MD 50 mL/hr at 12/03/13 1957    . enoxaparin (LOVENOX)  injection 30 mg  30 mg Subcutaneous Q24H Javier Docker, MD   30 mg at 12/03/13 1953  . furosemide (LASIX) tablet 20 mg  20 mg Oral QODAY Jaclyn M. Bissell, PA-C   20 mg at 12/03/13 1953  . HYDROcodone-acetaminophen (NORCO/VICODIN) 5-325 MG per tablet 1-2 tablet  1-2 tablet Oral Q6H PRN Javier Docker, MD   1 tablet at 12/04/13 1141  . insulin aspart (novoLOG) injection 0-15 Units  0-15 Units Subcutaneous 6 times per day Maryruth Bun Rama, MD   5 Units at 12/04/13 1308  . levothyroxine (SYNTHROID, LEVOTHROID) tablet 100 mcg  100 mcg Oral QAC breakfast Dayna Barker. Bissell, PA-C   100 mcg at 12/04/13 1017  . lisinopril (PRINIVIL,ZESTRIL) tablet 2.5 mg  2.5 mg Oral Daily Jaclyn M. Bissell, PA-C   2.5 mg at 12/03/13 1953  . morphine 2 MG/ML injection 0.5 mg  0.5 mg Intravenous Q2H PRN Javier Docker, MD      . morphine 2 MG/ML injection 1-2 mg  1-2 mg Intravenous Q2H PRN Javier Docker, MD      . polyvinyl alcohol (LIQUIFILM TEARS) 1.4 % ophthalmic solution 1 drop  1 drop Both Eyes QHS Jaclyn M. Bissell, PA-C      . [START ON 12/06/2013] Vitamin D (Ergocalciferol) (DRISDOL) capsule 50,000 Units  50,000 Units Oral Q7 days Jaclyn M. Christene Lye, PA-C  Allergies  Allergen Reactions  . Statins   . Vytorin [Ezetimibe-Simvastatin] Other (See Comments)    Leg cramps    History   Social History  . Marital Status: Divorced    Spouse Name: N/A    Number of Children: 4  . Years of Education: N/A   Occupational History  . Retired Engineer, civil (consulting)urse    Social History Main Topics  . Smoking status: Former Games developermoker  . Smokeless tobacco: Never Used  . Alcohol Use: No  . Drug Use: No  . Sexual Activity: Not Currently   Other Topics Concern  . Not on file   Social History Narrative   Widowed.  Lives with her son.  Normally able to ambulate without assistance.  Quit smoking as a teenager.               Family History  Problem Relation Age of Onset  . Diabetes Other   . Hypertension Other   . Uterine  cancer Other   . Diabetes Mother   . Heart attack Brother     ROS- All systems are reviewed and negative except as per the HPI above  Physical Exam: Filed Vitals:   12/04/13 0800 12/04/13 1109 12/04/13 1200 12/04/13 1359  BP:  121/66  102/60  Pulse:    69  Temp:    98.4 F (36.9 C)  TempSrc:    Oral  Resp: 18  16 17   Height:      Weight:      SpO2: 93%  93% 94%    GEN- The patient is elderly but pleasant appearing, alert and oriented x 3 today.   Head- normocephalic, atraumatic Eyes-  Sclera clear, conjunctiva pink Ears- hearing intact Oropharynx- clear Neck- supple  Lungs- Clear to ausculation bilaterally, normal work of breathing Heart- Regular rate and rhythm, 2/6 SEM LLSB GI- soft, NT, ND, + BS Extremities- no clubbing, cyanosis, or edema MS- R knee immobilizer in place Skin- no rash or lesion Psych- euthymic mood, full affect Neuro- strength and sensation are intact  EKG sinus rhythm with V pacing CXR- stable pacemaker lead positions Epic records including prior echo, cath, and notes are reviewed  Assessment and Plan:  Preoperative CV clearance  [name] [name] is a 80 y.o. female with a h/o CAD and a dilated CM (EF 25-30%) now admitted with R tibial plateau fracture following a mechanical fall.  She has done very well from a CV standpoint recently.  She has known CAD for which medical management has been previously advised.  She denies symptoms of ischemia or CHF.  She appears euvolemic on exam.  Though she is at least moderate risk for surgery, I do not feel that we could further modify her risks with additional CV testing at this time.  I would therefore recommend that you proceed with surgery if medically indicated with routine perioperative management.  Continue beta blocker if able perioperatively and use caution with IV fluids given her reduced EF. Her pacemaker function is normal presently.  A merlin on demand transmission can be performed in the PACU after  surgery by the staff to confirm that her pacemaker function remains normal post operatively.  Cardiology is available as needed. Please call with questions.
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
## 2013-12-04 NOTE — Anesthesia Postprocedure Evaluation (Signed)
  Anesthesia Post-op Note  Anesthesia Post Note  Patient: [name] [name]  Procedure(s) Performed: Procedure(s) (LRB): OPEN REDUCTION INTERNAL FIXATION (ORIF) TIBIAL PLATEAU (Right)  Anesthesia type: General  Patient location: PACU  Post pain: Pain level controlled  Post assessment: Post-op Vital signs reviewed  Last Vitals:  Filed Vitals:   12/04/13 2320  BP: 127/58  Pulse: 65  Temp: 36.9 C  Resp: 16    Post vital signs: Reviewed  Level of consciousness: sedated  Complications: No apparent anesthesia complications

## 2013-12-04 NOTE — Anesthesia Preprocedure Evaluation (Addendum)
Anesthesia Evaluation  Patient identified by MRN, date of birth, ID band Patient awake    Reviewed: Allergy & Precautions, H&P , NPO status , Patient's Chart, lab work & pertinent test results, reviewed documented beta blocker date and time   Airway Mallampati: II TM Distance: >3 FB Neck ROM: Full    Dental no notable dental hx. (+) Edentulous Upper, Edentulous Lower   Pulmonary former smoker,  breath sounds clear to auscultation  Pulmonary exam normal       Cardiovascular Exercise Tolerance: Poor hypertension, Pt. on medications and Pt. on home beta blockers + CAD and +CHF + dysrhythmias IIIRhythm:Regular Rate:Normal  Dilated Cardiomyopathy  LVEF 25-30% LBBB S/P Pacemaker insertion 2/15    Neuro/Psych Diabetic peripheral neuropathy Tremor negative psych ROS   GI/Hepatic Neg liver ROS, GERD-  Medicated and Controlled,  Endo/Other  diabetes, Poorly Controlled, Type 2, Oral Hypoglycemic AgentsHypothyroidism Hyperlipidemia  Renal/GU negative Renal ROS  negative genitourinary   Musculoskeletal  (+) Arthritis -, Osteoarthritis,    Abdominal   Peds  Hematology  (+) anemia ,   Anesthesia Other Findings   Reproductive/Obstetrics negative OB ROS                        Anesthesia Physical Anesthesia Plan  ASA: III  Anesthesia Plan: General   Post-op Pain Management:    Induction: Intravenous  Airway Management Planned: Oral ETT  Additional Equipment:   Intra-op Plan:   Post-operative Plan: Extubation in OR  Informed Consent: I have reviewed the patients History and Physical, chart, labs and discussed the procedure including the risks, benefits and alternatives for the proposed anesthesia with the patient or authorized representative who has indicated his/her understanding and acceptance.   Dental advisory given  Plan Discussed with: Anesthesiologist, CRNA and Surgeon  Anesthesia Plan  Comments:         Anesthesia Quick Evaluation

## 2013-12-04 NOTE — Care Management Note (Signed)
Cm consult for Home Health needs. Pt scheduled for ORIF of R lateral tibial plateau fx. Cm to continue to follow post surgical intervention. PT/OT to assess to further assist with dc planning. PTA pt from home with son.    Roxy Manns Avanthika Dehnert,MSN,RN [phone]

## 2013-12-04 NOTE — Brief Op Note (Signed)
12/03/2013 - 12/04/2013  9:42 PM  PATIENT:  Toniann Ket  80 y.o. female  PRE-OPERATIVE DIAGNOSIS:  right lateral tibial plateau fracture, comminuted and displaced  POST-OPERATIVE DIAGNOSIS:  right lateral tibial plateau fracture, comminuted and displaced  PROCEDURE:  Procedure(s): OPEN REDUCTION INTERNAL FIXATION (ORIF) TIBIAL PLATEAU (Right) , allograft bone grafting  SURGEON:  Surgeon(s) and Role:    * Verlee Rossetti, MD - Primary  PHYSICIAN ASSISTANT:   ASSISTANTS: Thea Gist, PA-C   ANESTHESIA:   general  EBL:  Total I/O In: -  Out: 175 [Urine:25; Blood:150]  BLOOD ADMINISTERED:none  DRAINS: none   LOCAL MEDICATIONS USED:  NONE  SPECIMEN:  No Specimen  DISPOSITION OF SPECIMEN:  N/A  COUNTS:  YES  TOURNIQUET:    DICTATION: .Other Dictation: Dictation Number [id number]  PLAN OF CARE: Admit to inpatient   PATIENT DISPOSITION:  PACU - hemodynamically stable.   Delay start of Pharmacological VTE agent (>24hrs) due to surgical blood loss or risk of bleeding: no

## 2013-12-04 NOTE — Progress Notes (Signed)
Consulted with anesthesiologist re cgb=151. He said to give insulin per sliding scale. [employer]

## 2013-12-04 NOTE — Transfer of Care (Signed)
Immediate Anesthesia Transfer of Care Note  Patient: [name] [name]  Procedure(s) Performed: Procedure(s): OPEN REDUCTION INTERNAL FIXATION (ORIF) TIBIAL PLATEAU (Right)  Patient Location: PACU  Anesthesia Type:General  Level of Consciousness: awake, alert  and oriented  Airway & Oxygen Therapy: Patient Spontanous Breathing and Patient connected to face mask oxygen  Post-op Assessment: Report given to PACU RN and Post -op Vital signs reviewed and stable  Post vital signs: Reviewed and stable  Complications: No apparent anesthesia complications

## 2013-12-05 DIAGNOSIS — I10 Essential (primary) hypertension: Secondary | ICD-10-CM | POA: Diagnosis not present

## 2013-12-05 DIAGNOSIS — I5022 Chronic systolic (congestive) heart failure: Secondary | ICD-10-CM | POA: Diagnosis not present

## 2013-12-05 DIAGNOSIS — D62 Acute posthemorrhagic anemia: Secondary | ICD-10-CM | POA: Diagnosis not present

## 2013-12-05 DIAGNOSIS — S82109A Unspecified fracture of upper end of unspecified tibia, initial encounter for closed fracture: Secondary | ICD-10-CM | POA: Diagnosis not present

## 2013-12-05 LAB — CBC
HCT: 30.9 % — ABNORMAL LOW (ref 36.0–46.0)
Hemoglobin: 9.7 g/dL — ABNORMAL LOW (ref 12.0–15.0)
MCH: 25.9 pg — AB (ref 26.0–34.0)
MCHC: 31.4 g/dL (ref 30.0–36.0)
MCV: 82.6 fL (ref 78.0–100.0)
PLATELETS: 148 10*3/uL — AB (ref 150–400)
RBC: 3.74 MIL/uL — ABNORMAL LOW (ref 3.87–5.11)
RDW: 14.2 % (ref 11.5–15.5)
WBC: 8.2 10*3/uL (ref 4.0–10.5)

## 2013-12-05 LAB — BASIC METABOLIC PANEL
BUN: 12 mg/dL (ref 6–23)
CO2: 23 mEq/L (ref 19–32)
CREATININE: 0.83 mg/dL (ref 0.50–1.10)
Calcium: 8.4 mg/dL (ref 8.4–10.5)
Chloride: 101 mEq/L (ref 96–112)
GFR, EST AFRICAN AMERICAN: 77 mL/min — AB (ref >90)
GFR, EST NON AFRICAN AMERICAN: 66 mL/min — AB (ref >90)
Glucose, Bld: 175 mg/dL — ABNORMAL HIGH (ref 70–99)
Potassium: 3.9 mEq/L (ref 3.7–5.3)
Sodium: 138 mEq/L (ref 137–147)

## 2013-12-05 LAB — GLUCOSE, CAPILLARY
GLUCOSE-CAPILLARY: 192 mg/dL — AB (ref 70–99)
GLUCOSE-CAPILLARY: 240 mg/dL — AB (ref 70–99)
GLUCOSE-CAPILLARY: 199 mg/dL — AB (ref 70–99)
Glucose-Capillary: 152 mg/dL — ABNORMAL HIGH (ref 70–99)

## 2013-12-05 MED ORDER — METHOCARBAMOL 500 MG PO TABS
500.0000 mg | ORAL_TABLET | Freq: Four times a day (QID) | ORAL | Status: DC | PRN
  Administered 2013-12-05 – 2013-12-06 (×2): 500 mg via ORAL
  Filled 2013-12-05 (×2): qty 1

## 2013-12-05 MED ORDER — ATORVASTATIN CALCIUM 40 MG PO TABS
40.0000 mg | ORAL_TABLET | Freq: Every day | ORAL | Status: DC
  Administered 2013-12-05 – 2013-12-06 (×2): 40 mg via ORAL
  Filled 2013-12-05 (×3): qty 1

## 2013-12-05 MED ORDER — INSULIN ASPART 100 UNIT/ML ~~LOC~~ SOLN
0.0000 [IU] | Freq: Three times a day (TID) | SUBCUTANEOUS | Status: DC
  Administered 2013-12-05: 5 [IU] via SUBCUTANEOUS
  Administered 2013-12-06: 8 [IU] via SUBCUTANEOUS
  Administered 2013-12-06: 5 [IU] via SUBCUTANEOUS
  Administered 2013-12-06 – 2013-12-07 (×2): 3 [IU] via SUBCUTANEOUS

## 2013-12-05 MED ORDER — ASPIRIN EC 81 MG PO TBEC
81.0000 mg | DELAYED_RELEASE_TABLET | Freq: Every day | ORAL | Status: DC
  Administered 2013-12-05 – 2013-12-07 (×3): 81 mg via ORAL
  Filled 2013-12-05 (×3): qty 1

## 2013-12-05 NOTE — Progress Notes (Signed)
Foley cath left intact d/t paientts low UOP.

## 2013-12-05 NOTE — Evaluation (Signed)
Physical Therapy Evaluation Patient Details Name: [name] [name] MRN: [id number] DOB: [birth year] Today's Date: 12/05/2013   History of Present Illness  80 yo femal s/p ORIF tibial plateau fx on  12/04/13; PMHx: OA, DM, CHF, L bundle branch block  Clinical Impression  Pt will benefit from PT to address deficits below; She reports to PT that she and her son have decided that she will go home; Recommending pt go for post acute rehab, SNF level therapies at this time; She will require 24hr assist if she does D/C home as well as RW, w/c and HHPT; she is currently requiring mod +2 assist to maintain NWB and safety transfer bed to chair.    Follow Up Recommendations SNF    Equipment Recommendations  Rolling walker with 5" wheels;Wheelchair (measurements PT) (pt states she is going home at this time)    Recommendations for Other Services       Precautions / Restrictions Precautions Precautions: Fall Precaution Comments: ROM right knee 0-45*; transfers only--awaiting clarification of KI Restrictions RLE Weight Bearing: Non weight bearing      Mobility  Bed Mobility Overal bed mobility: Needs Assistance Bed Mobility: Supine to Sit     Supine to sit: Min assist;+2 for physical assistance     General bed mobility comments: assist with trunk and RLE, incr time  Transfers Overall transfer level: Needs assistance Equipment used: Rolling walker (2 wheeled) Transfers: Sit to/from [employer] Sit to Stand: Mod assist;+2 physical assistance Stand pivot transfers: Max assist;+2 physical assistance       General transfer comment: +2 for safety, balance, NWB  Ambulation/Gait                Stairs            Wheelchair Mobility    Modified Rankin (Stroke Patients Only)       Balance Overall balance assessment: Needs assistance;History of Falls Sitting-balance support: No upper extremity supported;Feet supported Sitting balance-Leahy Scale: Fair        Standing balance-Leahy Scale: Zero                               Pertinent Vitals/Pain C/o pain, she was premedicated    Home Living Family/patient expects to be discharged to:: Private residence Living Arrangements: Children   Type of Home: House       Home Layout: Multi-level Home Equipment: None Additional Comments: sons are "in and out"; planning to put bed downstairs which is the same level she will enter home    Prior Function Level of Independence: Independent               Hand Dominance        Extremity/Trunk Assessment   Upper Extremity Assessment:  (UE tremors bil)           Lower Extremity Assessment: RLE deficits/detail RLE Deficits / Details: able to stretch ankle to neutral-PROM; knee flexion 5-15*--allowed to 45*       Communication   Communication: No difficulties  Cognition Arousal/Alertness: Awake/alert Behavior During Therapy: WFL for tasks assessed/performed Overall Cognitive Status: Within Functional Limits for tasks assessed                      General Comments      Exercises General Exercises - Lower Extremity Ankle Circles/Pumps: AROM;Both;10 reps      Assessment/Plan    PT Assessment Patient needs continued  PT services  PT Diagnosis Difficulty walking;Generalized weakness   PT Problem List Decreased strength;Decreased range of motion;Decreased activity tolerance;Decreased balance;Decreased mobility;Decreased knowledge of use of DME;Decreased knowledge of precautions  PT Treatment Interventions DME instruction;Gait training;Functional mobility training;Therapeutic activities;Therapeutic exercise;Patient/family education   PT Goals (Current goals can be found in the Care Plan section) Acute Rehab PT Goals Patient Stated Goal: home PT Goal Formulation: With patient Time For Goal Achievement: 12/12/13 Potential to Achieve Goals: Good    Frequency Min 3X/week   Barriers to discharge         Co-evaluation               End of Session Equipment Utilized During Treatment: Gait belt Activity Tolerance: Patient limited by fatigue Patient left: in chair;with call bell/phone within reach;with family/visitor present Nurse Communication: Mobility status         Time: 4010-2725 PT Time Calculation (min): 20 min   Charges:   PT Evaluation $Initial PT Evaluation Tier I: 1 Procedure PT Treatments $Therapeutic Activity: 8-22 mins   PT G Codes:          Caswell Corwin 12/05/2013, 3:42 PM

## 2013-12-05 NOTE — Op Note (Signed)
NAME:  [name] [name], [name] [name]               ACCOUNT NO.:  [id number]  MEDICAL RECORD NO.:  [id number]  LOCATION:  1606                         [facility]  PHYSICIAN:  Almedia BallsSteven R. Ranell PatrickNorris, M.D. DATE OF BIRTH:  Jan 24, 1934  DATE OF PROCEDURE:  12/04/2013 DATE OF DISCHARGE:                              OPERATIVE REPORT   PREOPERATIVE DIAGNOSIS:  Displaced and comminuted right tibial plateau fracture.  POSTOPERATIVE DIAGNOSIS:  Displaced and comminuted right tibial plateau fracture.  PROCEDURE PERFORMED:  Open reduction and internal fixation with allograft bone grafting of right displaced and comminuted tibial plateau fracture.  ATTENDING SURGEON:  Almedia BallsSteven R. Ranell PatrickNorris, M.D.  ASSISTANT:  Donnie Coffinhomas B. Dixon, PA-C, who scrubbed the entire procedure and necessary for satisfactory completion of surgery.  ANESTHESIA:  General anesthesia was used.  ESTIMATED BLOOD LOSS:  Less than 100 mL.  FLUID REPLACEMENT:  1000 mL crystalloid.  INSTRUMENT COUNTS:  Correct.  COMPLICATIONS:  There were no complications.  ANTIBIOTICS:  Perioperative antibiotics were given.  INDICATIONS:  The patient is a 80 year old female, who presents with history of a mechanical fall.  [name] severely depressed and displaced tibial plateau fracture.  The patient has a significant heart history and needed to be cleared both by Internal Medicine and Cardiology due to a low ejection fraction and also recent pacemaker placement.  The patient was cleared with moderate to high risk.  From a cardiac standpoint, we had a thorough conversation with the patient, discussing with her due to the major disruption of the joint line and functional instability due to the fracture that if we did not perform an open reduction and internal fixation, then she would be unlikely to walk again due to the deformity of the knee.  The patient agreed and wanted to proceed with surgery.  Risks and benefits  were discussed.  Informed consent obtained.  DESCRIPTION OF PROCEDURE:  After adequate level of anesthesia achieved, the patient was positioned on a flat Jackson operating room table.  The patient was secured to the table.  Neurovascular structures padded appropriately.  Right leg correctly identified.  Non-sterile tourniquet placed on proximal thigh.  Right leg was sterilely prepped and draped in the usual manner.  Time-out was called.  We entered the patient's knee using longitudinal midline incision with 10 blade scalpel.  Dissection down through subcutaneous tissues using Bovie.  We performed a lateral parapatellar incision through the fascia and then down.  On the tibia, we went directly to bone with subperiosteal soft tissue exposure.  We encountered fractured tibia, a fracture hematoma.  We worked through the fracture site and began with fluoroscopy, draped into the field, elevating her joint line back up, it was several centimeters down into the tibia.  We used a Restaurant manager, fast foodCobb elevator and Key elevator and then gently lifted her joint line back up anatomically, back filled with about 40 mL to 50 mL of cancellous bone chips, allograft, and we used a C-arm to assist us, we then went ahead and pushed the lateral cortex back into position.  We placed a [employer] pre-contoured lateral tibial plateau plate into position.  We verified position of the plate  with C-arm before affixing the plate to the tibia with 3.5 screws.  We then placed our __________ using fluoro to assist with the guidance, and these were all locked screws proximally.  We then placed our 2 kickstand screws and our final cortical shaft screw which was bicortical and packed in the remaining bone graft out on the lateral portion of the tibia.  We were pleased with the alignment of this restore stability as the patient had pretty significant varus valgus instability prior to elevating the joint line back in place.  We ranged the  knee.  Hardware was in good position. We then thoroughly irrigated and closed in layers.  The fascia closed with 0 Vicryl, followed by 2-0 Vicryl subcutaneous, and staples for skin.  Sterile compressive bandage and knee immobilizer applied.  The patient was taken to the recovery room in stable condition.     Almedia Balls. Ranell Patrick, M.D.     SRN/MEDQ  D:  12/04/2013  T:  12/04/2013  Job:  [id number]

## 2013-12-05 NOTE — Progress Notes (Signed)
CSW received consult for SNF placement.  Awaiting PT/OT eval to assist with further d/c planning.  CSW to continue to follow.  Providence Crosby, LCSWA Clinical Social Work [phone]

## 2013-12-05 NOTE — Progress Notes (Signed)
Subjective: Doing well.  Pain controlled.     Objective: Vital signs in last 24 hours: Temp:  [98.2 F (36.8 C)-99 F (37.2 C)] 99 F (37.2 C) (04/26 0541) Pulse Rate:  [61-74] 71 (04/26 0541) Resp:  [11-17] 16 (04/26 0800) BP: (102-142)/(41-94) 116/58 mmHg (04/26 0541) SpO2:  [93 %-98 %] 96 % (04/26 0800)  Intake/Output from previous day: 04/25 0701 - 04/26 0700 In: 2178.3 [P.O.:360; I.V.:1768.3; IV Piggyback:50] Out: 770 [Urine:620; Blood:150] Intake/Output this shift: Total I/O In: 100 [P.O.:100] Out: -    Recent Labs  12/03/13 1133 12/04/13 0510 12/05/13 0533  HGB 12.7 10.8* 9.7*    Recent Labs  12/04/13 0510 12/05/13 0533  WBC 8.4 8.2  RBC 4.07 3.74*  HCT 32.8* 30.9*  PLT 188 148*    Recent Labs  12/03/13 1133 12/05/13 0533  NA 138 138  K 4.5 3.9  CL 99 101  CO2 25 23  BUN 11 12  CREATININE 0.83 0.83  GLUCOSE 364* 175*  CALCIUM 10.3 8.4    Recent Labs  12/03/13 1140  INR 0.98    Neurologically intact Sensation intact distally Intact pulses distally Dorsiflexion/Plantar flexion intact Compartment soft  Assessment/Plan: Continue present care.  Advised patient that she will likely need short rehab placement after discharge.  Dr Ranell Patrick will follow in AM.     [name] [name] 12/05/2013, 9:32 AM

## 2013-12-05 NOTE — Progress Notes (Signed)
[facility] PROGRESS NOTE/CONSULT NOTE  [name] [name] UJW:[id number] DOB: [birth year] DOA: 12/03/2013 PCP: Sanda Linger, MD  Assessment/Plan: #1 right lateral tibial plateau fracture Secondary to mechanical fall. Patient states she slipped on a slick floor and injured her knee. Status post ORIF of tibial plateau on the right per Dr. Ranell Patrick 12/04/2013. Pain management. PT/OT. Per primary team.   #2 postoperative acute blood loss anemia H&H stable. Follow.  #3 hypothyroidism Continue Synthroid.  #4 uncontrolled diabetes mellitus type 2 Hemoglobin A1c 13.1. CBGs have ranged from 151-214. Continue sliding scale insulin. Also diabetic coordinator. May resume oral hypoglycemic agents on discharge.  #5 HTN Continue oral coreg, lisinopril, lasix  #6 coronary artery disease/chronic systolic heart failure Stable. Currently euvolemic. Continue Coreg, lisinopril, Lasix. Will resume patient's aspirin and Lipitor. Outpatient followup.  #7 left bundle branch block chronic Patient with pacemaker.  #8 hyperlipidemia Check a fasting lipid panel. Continue Lipitor.  #9 prophylaxis Lovenox for DVT prophylaxis per primary team.   Code Status: Full Family Communication: Updated patient no family at bedside. Disposition Plan: Likely SNF per primary team   Consultants:  [facility]: Dr. Darnelle Catalan 12/04/2013  Procedures:  Chest x-ray 12/03/2013  X-ray of the right thumb 12/03/2013  X-ray of the right knee 12/04/2013  ORIF right tibial plateau per Dr. Ranell Patrick 12/04/2013  Antibiotics:  None  HPI/Subjective: Patient states pain is controlled. Patient states she slipped on a slick floor at home and fell causing her injury to the right knee.  Objective: Filed Vitals:   12/05/13 0800  BP:   Pulse:   Temp:   Resp: 16    Intake/Output Summary (Last 24 hours) at 12/05/13 1030 Last data filed at 12/05/13 0800  Gross per 24 hour  Intake 2278.33 ml  Output    670 ml  Net  1608.33 ml   Filed Weights   12/03/13 1422  Weight: 60.328 kg (133 lb)    Exam:   General:  NAD  Cardiovascular: RRR  Respiratory: CTAB anterior lung fields.  Abdomen: Soft/NT/ND/+BS  Musculoskeletal: No clubbing cyanosis or edema. Right lower extremity in knee immobilizer.  Data Reviewed: Basic Metabolic Panel:  Recent Labs Lab 12/03/13 1133 12/05/13 0533  NA 138 138  K 4.5 3.9  CL 99 101  CO2 25 23  GLUCOSE 364* 175*  BUN 11 12  CREATININE 0.83 0.83  CALCIUM 10.3 8.4   Liver Function Tests: No results found for this basename: AST, ALT, ALKPHOS, BILITOT, PROT, ALBUMIN,  in the last 168 hours No results found for this basename: LIPASE, AMYLASE,  in the last 168 hours No results found for this basename: AMMONIA,  in the last 168 hours CBC:  Recent Labs Lab 12/03/13 1133 12/04/13 0510 12/05/13 0533  WBC 6.6 8.4 8.2  NEUTROABS 3.6  --   --   HGB 12.7 10.8* 9.7*  HCT 38.9 32.8* 30.9*  MCV 80.7 80.6 82.6  PLT 182 188 148*   Cardiac Enzymes:  Recent Labs Lab 12/04/13 1050  TROPONINI <0.30   BNP (last 3 results)  Recent Labs  12/03/13 1121  PROBNP 217.8   CBG:  Recent Labs Lab 12/04/13 1243 12/04/13 1701 12/04/13 2202 12/04/13 2322 12/05/13 0718  GLUCAP 214* 151* 151* 157* 152*    Recent Results (from the past 240 hour(s))  SURGICAL PCR SCREEN     Status: None   Collection Time    12/04/13 12:23 PM      Result Value Ref Range Status   MRSA, PCR NEGATIVE  NEGATIVE Final   Staphylococcus aureus NEGATIVE  NEGATIVE Final   Comment:            The Xpert SA Assay (FDA     approved for NASAL specimens     in patients over 26 years of age),     is one component of     a comprehensive surveillance     program.  Test performance has     been validated by [employer] for patients greater     than or equal to 13 year old.     It is not intended     to diagnose infection nor to     guide or monitor treatment.     Studies: Dg Chest  1 View  12/03/2013   CLINICAL DATA:  Fall.  Knee fracture.  Preoperative chest x-ray.  EXAM: CHEST - 1 VIEW  COMPARISON:  Two-view chest 10/02/2013  FINDINGS: The heart size is exaggerated by low lung volumes. Lungs are clear. Pacing wires are stable. The visualized soft tissues and bony thorax are unremarkable.  IMPRESSION: 1. Low lung volumes. 2. No acute cardiopulmonary disease or significant interval change.   Electronically Signed   By: Gennette Pac M.D.   On: 12/03/2013 12:00   Ct Knee Right Wo Contrast  12/03/2013   CLINICAL DATA:  Medial right knee pain, status post fall  EXAM: CT OF THE RIGHT KNEE WITHOUT CONTRAST  TECHNIQUE: Multidetector CT imaging was performed according to the standard protocol. Multiplanar CT image reconstructions were also generated.  COMPARISON:  12/03/2013  FINDINGS: There is generalized osteopenia. There is a severely comminuted, impacted and depressed lateral tibial plateau fracture with a 2.2 x 1.8 cm transverse and 2 cm craniocaudal osseous defect centrally involving the lateral tibial plateau. The lateral fracture fragment is laterally displaced by approximately 13 mm. There is a fracture cleft which extends to the tibial spine. There is no other fracture or dislocation. There is a large lipohemarthrosis. There is no focal fluid collection or hematoma.  IMPRESSION: Severely comminuted, impacted and depressed lateral tibial plateau fracture as described above. Large lipohemarthrosis.   Electronically Signed   By: Elige Ko   On: 12/03/2013 12:48   Dg Finger Thumb Right  12/03/2013   CLINICAL DATA:  Fall.  Thumb pain.  Fractured knee.  EXAM: RIGHT THUMB 2+V  COMPARISON:  None available.  FINDINGS: Soft tissue swelling is present at distal from without an acute fracture. The joints are located.  IMPRESSION: Soft tissue swelling in the distal thumb. No acute osseous abnormality.   Electronically Signed   By: Gennette Pac M.D.   On: 12/03/2013 12:05   Dg C-arm 61-120  Min-no Report  12/04/2013   CLINICAL DATA:  Fracture fixation right knee.  EXAM: RIGHT KNEE - 3 VIEW; DG C-ARM 61-120 MIN - NRPT [facility]  COMPARISON:  CT scan left knee 12/03/2013.  FINDINGS: We are provided with 3 fluoroscopic spot views of the right knee. Images demonstrate placement of lateral plate and screws and graft for fixation of the lateral tibial plateau fracture. No acute abnormality is identified.  IMPRESSION: ORIF lateral tibial plateau fracture.   Electronically Signed   By: Drusilla Kanner M.D.   On: 12/04/2013 21:45   Dg Knee 2 Views Right  12/04/2013   CLINICAL DATA:  Fracture fixation right knee.  EXAM: RIGHT KNEE - 3 VIEW; DG C-ARM 61-120 MIN - NRPT [facility]  COMPARISON:  CT scan left knee 12/03/2013.  FINDINGS: We are provided with 3 fluoroscopic spot views of the right knee. Images demonstrate placement of lateral plate and screws and graft for fixation of the lateral tibial plateau fracture. No acute abnormality is identified.  IMPRESSION: ORIF lateral tibial plateau fracture.   Electronically Signed   By: Drusilla Kanner M.D.   On: 12/04/2013 21:45    Scheduled Meds: . aspirin EC  81 mg Oral Daily  . atorvastatin  40 mg Oral Daily  . carvedilol  12.5 mg Oral BID WC  . docusate sodium  100 mg Oral BID  . enoxaparin (LOVENOX) injection  40 mg Subcutaneous Q24H  . fentaNYL      . ferrous sulfate  325 mg Oral TID PC  . furosemide  20 mg Oral QODAY  . insulin aspart  0-15 Units Subcutaneous 6 times per day  . levothyroxine  100 mcg Oral QAC breakfast  . lisinopril  2.5 mg Oral Daily  . polyvinyl alcohol  1 drop Both Eyes QHS  . [START ON 12/06/2013] Vitamin D (Ergocalciferol)  50,000 Units Oral Q7 days   Continuous Infusions: . sodium chloride 20 mL/hr at 12/05/13 0001    Principal Problem:   Closed fracture of lateral portion of right tibial plateau Active Problems:   HYPOTHYROIDISM   Type II or unspecified type diabetes mellitus with neurological manifestations,  uncontrolled(250.62)   DIABETIC PERIPHERAL NEUROPATHY   Hyperlipidemia LDL goal < 70   Congestive dilated cardiomyopathy   HTN (hypertension)   Chronic systolic heart failure   Coronary atherosclerosis of native coronary artery   Left bundle branch block   Preoperative clearance   Fracture of tibial plateau, closed   Postoperative anemia due to acute blood loss    Time spent: 30 mins    Rodolph Bong MD [facility] Pager [phone]. If 7PM-7AM, please contact night-coverage at [email or web address], password [facility] 12/05/2013, 10:30 AM  LOS: 2 days

## 2013-12-06 DIAGNOSIS — E1149 Type 2 diabetes mellitus with other diabetic neurological complication: Secondary | ICD-10-CM

## 2013-12-06 DIAGNOSIS — E039 Hypothyroidism, unspecified: Secondary | ICD-10-CM

## 2013-12-06 DIAGNOSIS — I10 Essential (primary) hypertension: Secondary | ICD-10-CM | POA: Diagnosis not present

## 2013-12-06 DIAGNOSIS — S82109A Unspecified fracture of upper end of unspecified tibia, initial encounter for closed fracture: Secondary | ICD-10-CM | POA: Diagnosis not present

## 2013-12-06 LAB — BASIC METABOLIC PANEL
BUN: 11 mg/dL (ref 6–23)
CALCIUM: 8.4 mg/dL (ref 8.4–10.5)
CO2: 25 mEq/L (ref 19–32)
CREATININE: 0.94 mg/dL (ref 0.50–1.10)
Chloride: 98 mEq/L (ref 96–112)
GFR calc Af Amer: 66 mL/min — ABNORMAL LOW (ref >90)
GFR, EST NON AFRICAN AMERICAN: 57 mL/min — AB (ref >90)
Glucose, Bld: 279 mg/dL — ABNORMAL HIGH (ref 70–99)
Potassium: 3.9 mEq/L (ref 3.7–5.3)
Sodium: 136 mEq/L — ABNORMAL LOW (ref 137–147)

## 2013-12-06 LAB — LIPID PANEL
CHOL/HDL RATIO: 4 ratio
CHOLESTEROL: 131 mg/dL (ref 0–200)
HDL: 33 mg/dL — ABNORMAL LOW (ref >39)
LDL Cholesterol: 70 mg/dL (ref 0–99)
Triglycerides: 138 mg/dL (ref <150)
VLDL: 28 mg/dL (ref 0–40)

## 2013-12-06 LAB — CBC
HEMATOCRIT: 28.9 % — AB (ref 36.0–46.0)
Hemoglobin: 9.3 g/dL — ABNORMAL LOW (ref 12.0–15.0)
MCH: 26.7 pg (ref 26.0–34.0)
MCHC: 32.2 g/dL (ref 30.0–36.0)
MCV: 83 fL (ref 78.0–100.0)
PLATELETS: 154 10*3/uL (ref 150–400)
RBC: 3.48 MIL/uL — ABNORMAL LOW (ref 3.87–5.11)
RDW: 14.1 % (ref 11.5–15.5)
WBC: 8.3 10*3/uL (ref 4.0–10.5)

## 2013-12-06 LAB — GLUCOSE, CAPILLARY
GLUCOSE-CAPILLARY: 296 mg/dL — AB (ref 70–99)
GLUCOSE-CAPILLARY: 194 mg/dL — AB (ref 70–99)
Glucose-Capillary: 220 mg/dL — ABNORMAL HIGH (ref 70–99)
Glucose-Capillary: 178 mg/dL — ABNORMAL HIGH (ref 70–99)

## 2013-12-06 MED ORDER — INSULIN GLARGINE 100 UNIT/ML ~~LOC~~ SOLN
5.0000 [IU] | Freq: Every day | SUBCUTANEOUS | Status: DC
  Administered 2013-12-06 – 2013-12-07 (×2): 5 [IU] via SUBCUTANEOUS
  Filled 2013-12-06 (×2): qty 0.05

## 2013-12-06 NOTE — Progress Notes (Signed)
Physical Therapy Treatment Patient Details Name: [name] [name] MRN: [id number] DOB: 05/17/1934 Today's Date: 12/06/2013    History of Present Illness 80 yo femal s/p ORIF tibial plateau fx on  12/04/13; PMHx: OA, DM, CHF, L bundle branch block    PT Comments    Pt progressing this session but still with strength, balance and activity tolerance limitations, placing her at risk for falls; Recommend SNF, pt is currently stating that she she wants to go home; Pt will need w/c with ELRs, RW and 24hr assist if she does go home  Follow Up Recommendations  Supervision/Assistance - 24 hour;SNF;Home health PT (pt refusing )     Equipment Recommendations  Rolling walker with 5" wheels;Wheelchair (measurements PT)    Recommendations for Other Services       Precautions / Restrictions Precautions Precautions: Fall Precaution Comments: ROM right knee 0-45* with PT; transfers only--awaiting clarification of KI Required Braces or Orthoses: Knee Immobilizer - Right Knee Immobilizer - Right: On at all times;Other (comment) (except PT/ROM per discussion with PA) Restrictions Weight Bearing Restrictions: Yes RLE Weight Bearing: Non weight bearing    Mobility  Bed Mobility Overal bed mobility: Needs Assistance Bed Mobility: Supine to Sit     Supine to sit: Min assist     General bed mobility comments: assist with trunk and RLE, incr time  Transfers Overall transfer level: Needs assistance Equipment used: Rolling walker (2 wheeled) Transfers: Sit to/from [employer]Stand;Stand Pivot Transfers Sit to Stand: Mod assist Stand pivot transfers: +2 safety/equipment;Mod assist          Ambulation/Gait                 Stairs            Wheelchair Mobility    Modified Rankin (Stroke Patients Only)       Balance Overall balance assessment: Needs assistance;History of Falls Sitting-balance support: No upper extremity supported;Feet supported Sitting balance-Leahy Scale: Fair      Standing balance support: During functional activity;Bilateral upper extremity supported Standing balance-Leahy Scale: Poor Standing balance comment: pt requires constant cues for  NWB, requires bil UE support and physical assist to maintain balance                    Cognition Arousal/Alertness: Awake/alert Behavior During Therapy: WFL for tasks assessed/performed Overall Cognitive Status: Within Functional Limits for tasks assessed                      Exercises General Exercises - Lower Extremity Ankle Circles/Pumps: AROM;Both;10 reps Quad Sets: AROM;Strengthening;10 reps;Both Other Exercises Other Exercises: gentle P/AAROM right knee 0-45*     General Comments        Pertinent Vitals/Pain Pain controlled, ice to right knee    Home Living                      Prior Function            PT Goals (current goals can now be found in the care plan section) Acute Rehab PT Goals Patient Stated Goal: home PT Goal Formulation: With patient Time For Goal Achievement: 12/12/13 Potential to Achieve Goals: Good Progress towards PT goals: Progressing toward goals    Frequency  Min 5X/week    PT Plan Current plan remains appropriate    Co-evaluation PT/OT/SLP Co-Evaluation/Treatment: Yes Reason for Co-Treatment: Complexity of the patient's impairments (multi-system involvement) PT goals addressed during session: Mobility/safety with mobility;Proper use  of DME       End of Session Equipment Utilized During Treatment: Gait belt;Right knee immobilizer   Patient left: in chair;with call bell/phone within reach;with family/visitor present     Time: 9191-6606 PT Time Calculation (min): 26 min  Charges:  $Therapeutic Activity: 23-37 mins                    G Codes:      Caswell Corwin 2014-01-03, 11:53 AM

## 2013-12-06 NOTE — Evaluation (Signed)
Occupational Therapy Evaluation Patient Details Name: [name] [name] MRN: [id number] DOB: 01/26/1934 Today's Date: 12/06/2013    History of Present Illness 80 yo femal s/p ORIF tibial plateau fx on  12/04/13; PMHx: OA, DM, CHF, L bundle branch block   Clinical Impression   Pt tolerated up to chair with no complaint of pain. Introduced AE for LB self care. Encouraged pt to consider SNF at d/c as she is really wanting to go home but she doesn't have help in place for more of the personal care tasks such a bathing/dressing. She has 2 sons at home. She states she will think about SNF option. Will benefit from continued OT services to improve ADL independence.     Follow Up Recommendations  SNF;Supervision/Assistance - 24 hour    Equipment Recommendations  3 in 1 bedside comode    Recommendations for Other Services       Precautions / Restrictions Precautions Precautions: Fall Precaution Comments: ROM right knee 0-45* with PT; transfers only--awaiting clarification of KI Required Braces or Orthoses: Knee Immobilizer - Right Knee Immobilizer - Right: On at all times;Other (comment) Restrictions Weight Bearing Restrictions: Yes RLE Weight Bearing: Non weight bearing      Mobility Bed Mobility Overal bed mobility: Needs Assistance Bed Mobility: Supine to Sit     Supine to sit: Min assist     General bed mobility comments: assist with trunk and RLE, incr time  Transfers Overall transfer level: Needs assistance Equipment used: Rolling walker (2 wheeled) Transfers: Sit to/from [employer] Sit to Stand: Mod assist Stand pivot transfers: +2 safety/equipment;Mod assist            Balance Overall balance assessment: Needs assistance;History of Falls Sitting-balance support: No upper extremity supported;Feet supported Sitting balance-Leahy Scale: Fair     Standing balance support: During functional activity;Bilateral upper extremity supported Standing  balance-Leahy Scale: Poor Standing balance comment: pt requires constant cues for  NWB, requires bil UE support and physical assist to maintain balance                            ADL Overall ADL's : Needs assistance/impaired Eating/Feeding: Independent;Sitting   Grooming: Wash/dry face;Sitting   Upper Body Bathing: Set up;Supervision/ safety;Sitting   Lower Body Bathing: +2 for safety/equipment;Maximal assistance;Sit to/from stand   Upper Body Dressing : Minimal assistance;Sitting   Lower Body Dressing: +2 for safety/equipment;Maximal assistance;Sit to/from stand   Toilet Transfer: +2 for safety/equipment;Moderate assistance;Stand-pivot;RW   Toileting- Clothing Manipulation and Hygiene: +2 for safety/equipment;Total assistance;Sit to/from stand         General ADL Comments: Discussed with pt and son that she will need assist initially with managing clothing in standing, etc as she is NWB and not able to let go of the walker currently to pull up clothing/perform hygiene, etc. Encourged her think about other family/friends that can help with toileting/bathing each time she needs to get up if she doesnt feel comfortable with sons helping with these tasks. Currently they do not have a firm plan in place for ADL assist. Sons can assist with basic transfers to wheelchair, etc. Introduced Sports administrator and other AE for LB self care. She needs constant cues for maintaining NWB on R LE and for RW management, hand placement etc. She needs assist also to turn walker and help reach back for chair.      Vision  Perception     Praxis      Pertinent Vitals/Pain No complaint of     Hand Dominance     Extremity/Trunk Assessment Upper Extremity Assessment Upper Extremity Assessment: Overall WFL for tasks assessed           Communication Communication Communication: No difficulties   Cognition Arousal/Alertness: Awake/alert Behavior During Therapy:  WFL for tasks assessed/performed Overall Cognitive Status: Within Functional Limits for tasks assessed                     General Comments          Shoulder Instructions      Home Living Family/patient expects to be discharged to:: Private residence Living Arrangements: Children   Type of Home: House       Home Layout: Multi-level     Bathroom Shower/Tub:  (sponge bathes)   Bathroom Toilet: Standard     Home Equipment: None   Additional Comments: sons are "in and out"; planning to put bed downstairs which is the same evel she will enter home      Prior Functioning/Environment Level of Independence: Independent             OT Diagnosis: Generalized weakness   OT Problem List: Decreased strength;Decreased knowledge of use of DME or AE;Decreased knowledge of precautions   OT Treatment/Interventions: Self-care/ADL training;Patient/family education;Therapeutic activities;DME and/or AE instruction    OT Goals(Current goals can be found in the care plan section) Acute Rehab OT Goals Patient Stated Goal: home OT Goal Formulation: With patient Time For Goal Achievement: 12/13/13 Potential to Achieve Goals: Good  OT Frequency: Min 2X/week   Barriers to D/C:            Co-evaluation PT/OT/SLP Co-Evaluation/Treatment: Yes Reason for Co-Treatment: For patient/therapist safety PT goals addressed during session: Mobility/safety with mobility;Proper use of DME OT goals addressed during session: ADL's and self-care;Proper use of Adaptive equipment and DME      End of Session Equipment Utilized During Treatment: Gait belt;Rolling walker;Right knee immobilizer CPM Right Knee CPM Right Knee: Off  Activity Tolerance: Patient tolerated treatment well Patient left: in chair;with call bell/phone within reach;with family/visitor present   Time: 8657-8469 OT Time Calculation (min): 42 min Charges:  OT General Charges $OT Visit: 1 Procedure OT  Evaluation $Initial OT Evaluation Tier I: 1 Procedure OT Treatments $Self Care/Home Management : 8-22 mins G-Codes:    [name] [name] [name] [name] [phone] 12/06/2013, 12:02 PM
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
## 2013-12-06 NOTE — Progress Notes (Signed)
Physical Therapy Treatment Patient Details Name: [name] [name] MRN: [id number] DOB: 01/08/1934 Today's Date: 12/06/2013    History of Present Illness 80 yo femal s/p ORIF tibial plateau fx on  12/04/13; PMHx: OA, DM, CHF, L bundle branch block    PT Comments    Pt will benefit from continued PT in acute setting and follow up PT at SNF level  Follow Up Recommendations  Supervision/Assistance - 24 hour;SNF     Equipment Recommendations  Rolling walker with 5" wheels;Wheelchair (measurements PT)    Recommendations for Other Services       Precautions / Restrictions Precautions Precautions: Fall Precaution Comments: ROM right knee 0-45* with PT; transfers only--awaiting clarification of KI Required Braces or Orthoses: Knee Immobilizer - Right Knee Immobilizer - Right: On at all times;Other (comment) Restrictions Weight Bearing Restrictions: Yes RLE Weight Bearing: Non weight bearing    Mobility  Bed Mobility Overal bed mobility: Needs Assistance Bed Mobility: Sit to Supine     Supine to sit: Min assist Sit to supine: Mod assist;Max assist   General bed mobility comments: assist with trunk and RLE, incr time  Transfers Overall transfer level: Needs assistance Equipment used: Rolling walker (2 wheeled) Transfers: Sit to/from BJ'sStand;Stand Pivot Transfers Sit to Stand: Max assist Stand pivot transfers: Max assist       General transfer comment: back to bed with +1 this pm but pt requiring more assist than am, having difficulty following commands, slow processing-?due to meds  Ambulation/Gait                 Stairs            Wheelchair Mobility    Modified Rankin (Stroke Patients Only)       Balance Overall balance assessment: Needs assistance;History of Falls Sitting-balance support: No upper extremity supported;Feet supported Sitting balance-Leahy Scale: Fair     Standing balance support: During functional activity;Bilateral upper  extremity supported Standing balance-Leahy Scale: Zero Standing balance comment: pt requires constant cues for  NWB, requires bil UE support and physical assist to maintain balance                    Cognition Arousal/Alertness: Awake/alert Behavior During Therapy: WFL for tasks assessed/performed Overall Cognitive Status: Within Functional Limits for tasks assessed                      Exercises General Exercises - Lower Extremity Ankle Circles/Pumps: AROM;Both;10 reps Quad Sets: AROM;Strengthening;10 reps;Both Other Exercises Other Exercises: gentle P/AAROM right knee 0-45*     General Comments        Pertinent Vitals/Pain     Home Living Family/patient expects to be discharged to:: Private residence Living Arrangements: Children   Type of Home: House     Home Layout: Multi-level Home Equipment: None Additional Comments: sons are "in and out"; planning to put bed downstairs which is the same evel she will enter home    Prior Function Level of Independence: Independent          PT Goals (current goals can now be found in the care plan section) Acute Rehab PT Goals Patient Stated Goal: home PT Goal Formulation: With patient Time For Goal Achievement: 12/12/13 Potential to Achieve Goals: Good Progress towards PT goals: Progressing toward goals    Frequency  Min 5X/week    PT Plan Current plan remains appropriate    Co-evaluation PT/OT/SLP Co-Evaluation/Treatment: Yes Reason for Co-Treatment: For patient/therapist safety PT goals  addressed during session: Mobility/safety with mobility;Proper use of DME OT goals addressed during session: ADL's and self-care;Proper use of Adaptive equipment and DME     End of Session Equipment Utilized During Treatment: Gait belt;Right knee immobilizer Activity Tolerance: Patient limited by fatigue Patient left: in bed;with call bell/phone within reach     Time: 1340-1356 PT Time Calculation (min): 16  min  Charges:  $Therapeutic Activity: 8-22 mins                    G Codes:      Caswell Corwin 12-29-2013, 2:02 PM

## 2013-12-06 NOTE — Progress Notes (Signed)
[facility] PROGRESS NOTE/CONSULT NOTE  CUSHENA HARTZ OBS:[id number] DOB: Jul 23, 1934 DOA: 12/03/2013 PCP: Sanda Linger, MD  Assessment/Plan: #1 right lateral tibial plateau fracture Secondary to mechanical fall. Patient states she slipped on a slick floor and injured her knee. Status post ORIF of tibial plateau on the right per Dr. Ranell Patrick 12/04/2013. Pain management. PT/OT. Per primary team.   #2 postoperative acute blood loss anemia H&H stable. Follow.  #3 hypothyroidism Continue Synthroid.  #4 uncontrolled diabetes mellitus type 2 Hemoglobin A1c 13.1. CBGs have ranged from 1194-220. Continue sliding scale insulin. We'll start low dose Lantus 5 units daily and titrate while in house. On discharge will recommend increasing metformin dose to 1000 milligrams daily and continue home regimen of glipizide with close outpatient followup with PCP.   #5 HTN Continue oral coreg, lisinopril, lasix  #6 coronary artery disease/chronic systolic heart failure Stable. Currently euvolemic. Continue Coreg, lisinopril, Lasix, aspirin and Lipitor. Outpatient followup.  #7 left bundle branch block chronic Patient with pacemaker.  #8 hyperlipidemia Fasting lipid panel with LDL of 70. Continue Lipitor.  #9 prophylaxis Lovenox for DVT prophylaxis per primary team.   Code Status: Full Family Communication: Updated patient no family at bedside. Disposition Plan: Likely SNF per primary team   Consultants:  [facility]: Dr. Darnelle Catalan 12/04/2013  Procedures:  Chest x-ray 12/03/2013  X-ray of the right thumb 12/03/2013  X-ray of the right knee 12/04/2013  ORIF right tibial plateau per Dr. Ranell Patrick 12/04/2013  Antibiotics:  None  HPI/Subjective: Patient states pain is controlled. Patient states she slipped on a slick floor at home and fell causing her injury to the right knee. No complaints.  Objective: Filed Vitals:   12/06/13 0600  BP: 117/62  Pulse: 78  Temp: 99.5 F  (37.5 C)  Resp: 18    Intake/Output Summary (Last 24 hours) at 12/06/13 0859 Last data filed at 12/06/13 0545  Gross per 24 hour  Intake    684 ml  Output   1050 ml  Net   -366 ml   Filed Weights   12/03/13 1422  Weight: 60.328 kg (133 lb)    Exam:   General:  NAD  Cardiovascular: RRR  Respiratory: CTAB anterior lung fields.  Abdomen: Soft/NT/ND/+BS  Musculoskeletal: No clubbing cyanosis or edema. Right lower extremity in knee immobilizer.  Data Reviewed: Basic Metabolic Panel:  Recent Labs Lab 12/03/13 1133 12/05/13 0533 12/06/13 0425  NA 138 138 136*  K 4.5 3.9 3.9  CL 99 101 98  CO2 25 23 25   GLUCOSE 364* 175* 279*  BUN 11 12 11   CREATININE 0.83 0.83 0.94  CALCIUM 10.3 8.4 8.4   Liver Function Tests: No results found for this basename: AST, ALT, ALKPHOS, BILITOT, PROT, ALBUMIN,  in the last 168 hours No results found for this basename: LIPASE, AMYLASE,  in the last 168 hours No results found for this basename: AMMONIA,  in the last 168 hours CBC:  Recent Labs Lab 12/03/13 1133 12/04/13 0510 12/05/13 0533 12/06/13 0425  WBC 6.6 8.4 8.2 8.3  NEUTROABS 3.6  --   --   --   HGB 12.7 10.8* 9.7* 9.3*  HCT 38.9 32.8* 30.9* 28.9*  MCV 80.7 80.6 82.6 83.0  PLT 182 188 148* 154   Cardiac Enzymes:  Recent Labs Lab 12/04/13 1050  TROPONINI <0.30   BNP (last 3 results)  Recent Labs  12/03/13 1121  PROBNP 217.8   CBG:  Recent Labs Lab 12/05/13 0718 12/05/13 1206 12/05/13 1654 12/05/13 2046  12/06/13 0737  GLUCAP 152* 192* 240* 199* 296*    Recent Results (from the past 240 hour(s))  SURGICAL PCR SCREEN     Status: None   Collection Time    12/04/13 12:23 PM      Result Value Ref Range Status   MRSA, PCR NEGATIVE  NEGATIVE Final   Staphylococcus aureus NEGATIVE  NEGATIVE Final   Comment:            The Xpert SA Assay (FDA     approved for NASAL specimens     in patients over 78 years of age),     is one component of     a  comprehensive surveillance     program.  Test performance has     been validated by [facility] for patients greater     than or equal to 78 year old.     It is not intended     to diagnose infection nor to     guide or monitor treatment.     Studies: Dg C-arm 61-120 Min-no Report  12/04/2013   CLINICAL DATA:  Fracture fixation right knee.  EXAM: RIGHT KNEE - 3 VIEW; DG C-ARM 61-120 MIN - NRPT [facility]  COMPARISON:  CT scan left knee 12/03/2013.  FINDINGS: We are provided with 3 fluoroscopic spot views of the right knee. Images demonstrate placement of lateral plate and screws and graft for fixation of the lateral tibial plateau fracture. No acute abnormality is identified.  IMPRESSION: ORIF lateral tibial plateau fracture.   Electronically Signed   By: Drusilla Kannerhomas  Dalessio M.D.   On: 12/04/2013 21:45   Dg Knee 2 Views Right  12/04/2013   CLINICAL DATA:  Fracture fixation right knee.  EXAM: RIGHT KNEE - 3 VIEW; DG C-ARM 61-120 MIN - NRPT [facility]  COMPARISON:  CT scan left knee 12/03/2013.  FINDINGS: We are provided with 3 fluoroscopic spot views of the right knee. Images demonstrate placement of lateral plate and screws and graft for fixation of the lateral tibial plateau fracture. No acute abnormality is identified.  IMPRESSION: ORIF lateral tibial plateau fracture.   Electronically Signed   By: Drusilla Kannerhomas  Dalessio M.D.   On: 12/04/2013 21:45    Scheduled Meds: . aspirin EC  81 mg Oral Daily  . atorvastatin  40 mg Oral Daily  . carvedilol  12.5 mg Oral BID WC  . docusate sodium  100 mg Oral BID  . enoxaparin (LOVENOX) injection  40 mg Subcutaneous Q24H  . ferrous sulfate  325 mg Oral TID PC  . furosemide  20 mg Oral QODAY  . insulin aspart  0-15 Units Subcutaneous TID AC  . insulin glargine  5 Units Subcutaneous QAC breakfast  . levothyroxine  100 mcg Oral QAC breakfast  . lisinopril  2.5 mg Oral Daily  . polyvinyl alcohol  1 drop Both Eyes QHS  . Vitamin D (Ergocalciferol)  50,000 Units Oral  Q7 days   Continuous Infusions: . sodium chloride Stopped (12/05/13 1942)    Principal Problem:   Closed fracture of lateral portion of right tibial plateau Active Problems:   HYPOTHYROIDISM   Type II or unspecified type diabetes mellitus with neurological manifestations, uncontrolled(250.62)   DIABETIC PERIPHERAL NEUROPATHY   Hyperlipidemia LDL goal < 70   Congestive dilated cardiomyopathy   HTN (hypertension)   Chronic systolic heart failure   Coronary atherosclerosis of native coronary artery   Left bundle branch block   Preoperative clearance  Fracture of tibial plateau, closed   Postoperative anemia due to acute blood loss    Time spent: 30 mins    Rodolph Bong MD [facility] Pager [phone]. If 7PM-7AM, please contact night-coverage at [email or web address], password [facility] 12/06/2013, 8:59 AM  LOS: 3 days
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
## 2013-12-06 NOTE — Progress Notes (Signed)
Clinical Social Work Department CLINICAL SOCIAL WORK PLACEMENT NOTE 12/06/2013  Patient:  [name] [name], [name] [name]  Account Number:  [id number] Admit date:  12/03/2013  Clinical Social Worker:  Cori Razor, LCSW  Date/time:  12/06/2013 01:21 PM  Clinical Social Work is seeking post-discharge placement for this patient at the following level of care:   SKILLED NURSING   (*CSW will update this form in Epic as items are completed)   12/06/2013  Patient/family provided with [facility] Department of Clinical Social Work's list of facilities offering this level of care within the geographic area requested by the patient (or if unable, by the patient's family).  12/06/2013  Patient/family informed of their freedom to choose among providers that offer the needed level of care, that participate in Medicare, Medicaid or managed care program needed by the patient, have an available bed and are willing to accept the patient.    Patient/family informed of [facility]' ownership interest in [facility], as well as of the fact that they are under no obligation to receive care at this facility.  PASARR submitted to EDS on 12/05/2013 PASARR number received from EDS on 12/05/2013  FL2 transmitted to all facilities in geographic area requested by pt/family on  12/06/2013 FL2 transmitted to all facilities within larger geographic area on   Patient informed that his/her managed care company has contracts with or will negotiate with  certain facilities, including the following:     Patient/family informed of bed offers received:   Patient chooses bed at  Physician recommends and patient chooses bed at    Patient to be transferred to  on   Patient to be transferred to facility by   The following physician request were entered in Epic:   Additional Comments:  Cori Razor LCSW [phone]

## 2013-12-06 NOTE — Progress Notes (Signed)
   Subjective: 2 Days Post-Op Procedure(s) (LRB): OPEN REDUCTION INTERNAL FIXATION (ORIF) TIBIAL PLATEAU (Right)  Pt c/o mild pain  Refusing SNF at this time so plan for d/c home Patient reports pain as mild.  Objective:   VITALS:   Filed Vitals:   12/06/13 0600  BP: 117/62  Pulse: 78  Temp: 99.5 F (37.5 C)  Resp: 18    Right lower leg nv intact distally Dressing in place No rashes or edema  LABS  Recent Labs  12/04/13 0510 12/05/13 0533 12/06/13 0425  HGB 10.8* 9.7* 9.3*  HCT 32.8* 30.9* 28.9*  WBC 8.4 8.2 8.3  PLT 188 148* 154     Recent Labs  12/03/13 1133 12/05/13 0533 12/06/13 0425  NA 138 138 136*  K 4.5 3.9 3.9  BUN 11 12 11   CREATININE 0.83 0.83 0.94  GLUCOSE 364* 175* 279*     Assessment/Plan: 2 Days Post-Op Procedure(s) (LRB): OPEN REDUCTION INTERNAL FIXATION (ORIF) TIBIAL PLATEAU (Right)  Continue PT/OT Plan to d/c home tomorrow Pain control as needed   Alphonsa Overall, MPAS, PA-C  12/06/2013, 9:56 AM
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
## 2013-12-07 ENCOUNTER — Encounter (HOSPITAL_COMMUNITY): Payer: Self-pay | Admitting: Orthopedic Surgery

## 2013-12-07 DIAGNOSIS — S82409A Unspecified fracture of shaft of unspecified fibula, initial encounter for closed fracture: Secondary | ICD-10-CM | POA: Diagnosis not present

## 2013-12-07 DIAGNOSIS — M6281 Muscle weakness (generalized): Secondary | ICD-10-CM | POA: Diagnosis not present

## 2013-12-07 DIAGNOSIS — G609 Hereditary and idiopathic neuropathy, unspecified: Secondary | ICD-10-CM | POA: Diagnosis not present

## 2013-12-07 DIAGNOSIS — M25569 Pain in unspecified knee: Secondary | ICD-10-CM | POA: Diagnosis not present

## 2013-12-07 DIAGNOSIS — D62 Acute posthemorrhagic anemia: Secondary | ICD-10-CM | POA: Diagnosis not present

## 2013-12-07 DIAGNOSIS — Z5189 Encounter for other specified aftercare: Secondary | ICD-10-CM | POA: Diagnosis not present

## 2013-12-07 DIAGNOSIS — I5022 Chronic systolic (congestive) heart failure: Secondary | ICD-10-CM | POA: Diagnosis not present

## 2013-12-07 DIAGNOSIS — S82009A Unspecified fracture of unspecified patella, initial encounter for closed fracture: Secondary | ICD-10-CM | POA: Diagnosis not present

## 2013-12-07 DIAGNOSIS — S82109A Unspecified fracture of upper end of unspecified tibia, initial encounter for closed fracture: Secondary | ICD-10-CM | POA: Diagnosis not present

## 2013-12-07 DIAGNOSIS — E1149 Type 2 diabetes mellitus with other diabetic neurological complication: Secondary | ICD-10-CM | POA: Diagnosis not present

## 2013-12-07 DIAGNOSIS — I10 Essential (primary) hypertension: Secondary | ICD-10-CM | POA: Diagnosis not present

## 2013-12-07 DIAGNOSIS — I509 Heart failure, unspecified: Secondary | ICD-10-CM | POA: Diagnosis not present

## 2013-12-07 DIAGNOSIS — S8290XD Unspecified fracture of unspecified lower leg, subsequent encounter for closed fracture with routine healing: Secondary | ICD-10-CM | POA: Diagnosis not present

## 2013-12-07 DIAGNOSIS — I2510 Atherosclerotic heart disease of native coronary artery without angina pectoris: Secondary | ICD-10-CM | POA: Diagnosis not present

## 2013-12-07 DIAGNOSIS — Z4801 Encounter for change or removal of surgical wound dressing: Secondary | ICD-10-CM | POA: Diagnosis not present

## 2013-12-07 DIAGNOSIS — R269 Unspecified abnormalities of gait and mobility: Secondary | ICD-10-CM | POA: Diagnosis not present

## 2013-12-07 DIAGNOSIS — I428 Other cardiomyopathies: Secondary | ICD-10-CM | POA: Diagnosis not present

## 2013-12-07 DIAGNOSIS — E039 Hypothyroidism, unspecified: Secondary | ICD-10-CM | POA: Diagnosis not present

## 2013-12-07 DIAGNOSIS — Z9181 History of falling: Secondary | ICD-10-CM | POA: Diagnosis not present

## 2013-12-07 DIAGNOSIS — R279 Unspecified lack of coordination: Secondary | ICD-10-CM | POA: Diagnosis not present

## 2013-12-07 LAB — BASIC METABOLIC PANEL
BUN: 13 mg/dL (ref 6–23)
CALCIUM: 8.6 mg/dL (ref 8.4–10.5)
CO2: 24 mEq/L (ref 19–32)
Chloride: 97 mEq/L (ref 96–112)
Creatinine, Ser: 0.88 mg/dL (ref 0.50–1.10)
GFR calc non Af Amer: 62 mL/min — ABNORMAL LOW (ref >90)
GFR, EST AFRICAN AMERICAN: 72 mL/min — AB (ref >90)
GLUCOSE: 199 mg/dL — AB (ref 70–99)
Potassium: 3.9 mEq/L (ref 3.7–5.3)
Sodium: 134 mEq/L — ABNORMAL LOW (ref 137–147)

## 2013-12-07 LAB — CBC
HEMATOCRIT: 27.2 % — AB (ref 36.0–46.0)
Hemoglobin: 9.1 g/dL — ABNORMAL LOW (ref 12.0–15.0)
MCH: 27.4 pg (ref 26.0–34.0)
MCHC: 33.5 g/dL (ref 30.0–36.0)
MCV: 81.9 fL (ref 78.0–100.0)
PLATELETS: 158 10*3/uL (ref 150–400)
RBC: 3.32 MIL/uL — ABNORMAL LOW (ref 3.87–5.11)
RDW: 14 % (ref 11.5–15.5)
WBC: 7.7 10*3/uL (ref 4.0–10.5)

## 2013-12-07 LAB — GLUCOSE, CAPILLARY: Glucose-Capillary: 196 mg/dL — ABNORMAL HIGH (ref 70–99)

## 2013-12-07 MED ORDER — METFORMIN HCL ER 750 MG PO TB24: 750.0000 mg | ORAL_TABLET | Freq: Every day | ORAL | Status: DC

## 2013-12-07 MED ORDER — METHOCARBAMOL 500 MG PO TABS: 500.0000 mg | ORAL_TABLET | Freq: Four times a day (QID) | ORAL | Status: DC | PRN

## 2013-12-07 MED ORDER — METFORMIN HCL ER (OSM) 1000 MG PO TB24: 1000.0000 mg | ORAL_TABLET | Freq: Every day | ORAL | Status: DC

## 2013-12-07 MED ORDER — HYDROCODONE-ACETAMINOPHEN 5-325 MG PO TABS: 1.0000 | ORAL_TABLET | Freq: Four times a day (QID) | ORAL | Status: DC | PRN

## 2013-12-07 NOTE — Progress Notes (Signed)
Clinical Social Work Department CLINICAL SOCIAL WORK PLACEMENT NOTE 12/07/2013  Patient:  [name] [name], [name] [name]  Account Number:  [id number] Admit date:  12/03/2013  Clinical Social Worker:  Cori Razor, LCSW  Date/time:  12/06/2013 01:21 PM  Clinical Social Work is seeking post-discharge placement for this patient at the following level of care:   SKILLED NURSING   (*CSW will update this form in Epic as items are completed)   12/06/2013  Patient/family provided with [facility] Department of Clinical Social Work's list of facilities offering this level of care within the geographic area requested by the patient (or if unable, by the patient's family).  12/06/2013  Patient/family informed of their freedom to choose among providers that offer the needed level of care, that participate in Medicare, Medicaid or managed care program needed by the patient, have an available bed and are willing to accept the patient.    Patient/family informed of [facility]' ownership interest in [facility], as well as of the fact that they are under no obligation to receive care at this facility.  PASARR submitted to EDS on 12/05/2013 PASARR number received from EDS on 12/05/2013  FL2 transmitted to all facilities in geographic area requested by pt/family on  12/06/2013 FL2 transmitted to all facilities within larger geographic area on   Patient informed that his/her managed care company has contracts with or will negotiate with  certain facilities, including the following:     Patient/family informed of bed offers received:  12/06/2013 Patient chooses bed at [facility] Physician recommends and patient chooses bed at    Patient to be transferred to [facility] on  12/07/2013 Patient to be transferred to facility by [facility]  The following physician request were entered in Epic:   Additional Comments:  Cori Razor LCSW [phone]

## 2013-12-07 NOTE — Progress Notes (Signed)
   Subjective: 3 Days Post-Op Procedure(s) (LRB): OPEN REDUCTION INTERNAL FIXATION (ORIF) TIBIAL PLATEAU (Right)  Pt doing well No pain this morning as long as there is no movement of right leg Ready for d/c to SNF today Patient reports pain as mild.  Objective:   VITALS:   Filed Vitals:   12/07/13 0504  BP: 133/64  Pulse: 75  Temp: 97.6 F (36.4 C)  Resp: 16    Right knee incision healing well nv intact distally No rashes or edema  LABS  Recent Labs  12/05/13 0533 12/06/13 0425 12/07/13 0430  HGB 9.7* 9.3* 9.1*  HCT 30.9* 28.9* 27.2*  WBC 8.2 8.3 7.7  PLT 148* 154 158     Recent Labs  12/05/13 0533 12/06/13 0425 12/07/13 0430  NA 138 136* 134*  K 3.9 3.9 3.9  BUN 12 11 13   CREATININE 0.83 0.94 0.88  GLUCOSE 175* 279* 199*     Assessment/Plan: 3 Days Post-Op Procedure(s) (LRB): OPEN REDUCTION INTERNAL FIXATION (ORIF) TIBIAL PLATEAU (Right)  D/c to snf today Happy that patient has reconsidered ST SNF F/u in 2 weeks with Dr. Ranell Patrick Pain control as needed    Alphonsa Overall, MPAS, PA-C  12/07/2013, 7:25 AM

## 2013-12-07 NOTE — Progress Notes (Signed)
ST Rehab bed available at [facility] today if pt is stable for d/c. Pt / family are in agreement with SNF placement. FL2 has been signed.   Cori Razor LCSW [phone]

## 2013-12-07 NOTE — Discharge Summary (Signed)
Physician Discharge Summary   Patient ID: [name] [name] MRN: [id number] DOB/AGE: 78/03/1934 78 y.o.  Admit date: 12/03/2013 Discharge date: 12/07/2013  Admission Diagnoses:  Principal Problem:   Closed fracture of lateral portion of right tibial plateau Active Problems:   HYPOTHYROIDISM   Type II or unspecified type diabetes mellitus with neurological manifestations, uncontrolled(250.62)   DIABETIC PERIPHERAL NEUROPATHY   Hyperlipidemia LDL goal < 70   Congestive dilated cardiomyopathy   HTN (hypertension)   Chronic systolic heart failure   Coronary atherosclerosis of native coronary artery   Left bundle branch block   Preoperative clearance   Fracture of tibial plateau, closed   Postoperative anemia due to acute blood loss   Discharge Diagnoses:  Same   Surgeries: Procedure(s): OPEN REDUCTION INTERNAL FIXATION (ORIF) TIBIAL PLATEAU on 12/03/2013 - 12/04/2013   Consultants: PT/OT  Discharged Condition: Stable  Hospital Course: [name] [name] is an 80 y.o. female who was admitted 12/03/2013 with a chief complaint of  Chief Complaint  Patient presents with  . knee pain   , and found to have a diagnosis of Closed fracture of lateral portion of right tibial plateau.  They were brought to the operating room on 12/03/2013 - 12/04/2013 and underwent the above named procedures.    The patient had an uncomplicated hospital course and was stable for discharge.  Recent vital signs:  Filed Vitals:   12/07/13 0504  BP: 133/64  Pulse: 75  Temp: 97.6 F (36.4 C)  Resp: 16    Recent laboratory studies:  Results for orders placed during the hospital encounter of 12/03/13  SURGICAL PCR SCREEN      Result Value Ref Range   MRSA, PCR NEGATIVE  NEGATIVE   Staphylococcus aureus NEGATIVE  NEGATIVE  CBC WITH DIFFERENTIAL      Result Value Ref Range   WBC 6.6  4.0 - 10.5 K/uL   RBC 4.82  3.87 - 5.11 MIL/uL   Hemoglobin 12.7  12.0 - 15.0 g/dL   HCT 40.938.9  81.136.0 - 91.446.0 %   MCV 80.7   78.0 - 100.0 fL   MCH 26.3  26.0 - 34.0 pg   MCHC 32.6  30.0 - 36.0 g/dL   RDW 78.213.9  95.611.5 - 21.315.5 %   Platelets 182  150 - 400 K/uL   Neutrophils Relative % 55  43 - 77 %   Neutro Abs 3.6  1.7 - 7.7 K/uL   Lymphocytes Relative 38  12 - 46 %   Lymphs Abs 2.5  0.7 - 4.0 K/uL   Monocytes Relative 5  3 - 12 %   Monocytes Absolute 0.3  0.1 - 1.0 K/uL   Eosinophils Relative 2  0 - 5 %   Eosinophils Absolute 0.2  0.0 - 0.7 K/uL   Basophils Relative 0  0 - 1 %   Basophils Absolute 0.0  0.0 - 0.1 K/uL  BASIC METABOLIC PANEL      Result Value Ref Range   Sodium 138  137 - 147 mEq/L   Potassium 4.5  3.7 - 5.3 mEq/L   Chloride 99  96 - 112 mEq/L   CO2 25  19 - 32 mEq/L   Glucose, Bld 364 (*) 70 - 99 mg/dL   BUN 11  6 - 23 mg/dL   Creatinine, Ser 0.860.83  0.50 - 1.10 mg/dL   Calcium 57.810.3  8.4 - 46.910.5 mg/dL   GFR calc non Af Amer 66 (*) >90 mL/min   GFR calc Af  Amer 77 (*) >90 mL/min  PRO B NATRIURETIC PEPTIDE      Result Value Ref Range   Pro B Natriuretic peptide (BNP) 217.8  0 - 450 pg/mL  PROTIME-INR      Result Value Ref Range   Prothrombin Time 12.8  11.6 - 15.2 seconds   INR 0.98  0.00 - 1.49  GLUCOSE, CAPILLARY      Result Value Ref Range   Glucose-Capillary 199 (*) 70 - 99 mg/dL  CBC      Result Value Ref Range   WBC 8.4  4.0 - 10.5 K/uL   RBC 4.07  3.87 - 5.11 MIL/uL   Hemoglobin 10.8 (*) 12.0 - 15.0 g/dL   HCT 16.1 (*) 09.6 - 04.5 %   MCV 80.6  78.0 - 100.0 fL   MCH 26.5  26.0 - 34.0 pg   MCHC 32.9  30.0 - 36.0 g/dL   RDW 40.9  81.1 - 91.4 %   Platelets 188  150 - 400 K/uL  GLUCOSE, CAPILLARY      Result Value Ref Range   Glucose-Capillary 167 (*) 70 - 99 mg/dL  GLUCOSE, CAPILLARY      Result Value Ref Range   Glucose-Capillary 176 (*) 70 - 99 mg/dL   Comment 1 Notify RN    GLUCOSE, CAPILLARY      Result Value Ref Range   Glucose-Capillary 190 (*) 70 - 99 mg/dL  GLUCOSE, CAPILLARY      Result Value Ref Range   Glucose-Capillary 212 (*) 70 - 99 mg/dL  HEMOGLOBIN  N8G      Result Value Ref Range   Hemoglobin A1C 13.1 (*) <5.7 %   Mean Plasma Glucose 329 (*) <117 mg/dL  TROPONIN I      Result Value Ref Range   Troponin I <0.30  <0.30 ng/mL  GLUCOSE, CAPILLARY      Result Value Ref Range   Glucose-Capillary 214 (*) 70 - 99 mg/dL  GLUCOSE, CAPILLARY      Result Value Ref Range   Glucose-Capillary 151 (*) 70 - 99 mg/dL  GLUCOSE, CAPILLARY      Result Value Ref Range   Glucose-Capillary 151 (*) 70 - 99 mg/dL  GLUCOSE, CAPILLARY      Result Value Ref Range   Glucose-Capillary 157 (*) 70 - 99 mg/dL  CBC      Result Value Ref Range   WBC 8.2  4.0 - 10.5 K/uL   RBC 3.74 (*) 3.87 - 5.11 MIL/uL   Hemoglobin 9.7 (*) 12.0 - 15.0 g/dL   HCT 95.6 (*) 21.3 - 08.6 %   MCV 82.6  78.0 - 100.0 fL   MCH 25.9 (*) 26.0 - 34.0 pg   MCHC 31.4  30.0 - 36.0 g/dL   RDW 57.8  46.9 - 62.9 %   Platelets 148 (*) 150 - 400 K/uL  BASIC METABOLIC PANEL      Result Value Ref Range   Sodium 138  137 - 147 mEq/L   Potassium 3.9  3.7 - 5.3 mEq/L   Chloride 101  96 - 112 mEq/L   CO2 23  19 - 32 mEq/L   Glucose, Bld 175 (*) 70 - 99 mg/dL   BUN 12  6 - 23 mg/dL   Creatinine, Ser 5.28  0.50 - 1.10 mg/dL   Calcium 8.4  8.4 - 41.3 mg/dL   GFR calc non Af Amer 66 (*) >90 mL/min   GFR calc Af Amer 77 (*) >90 mL/min  GLUCOSE, CAPILLARY      Result Value Ref Range   Glucose-Capillary 152 (*) 70 - 99 mg/dL  CBC      Result Value Ref Range   WBC 8.3  4.0 - 10.5 K/uL   RBC 3.48 (*) 3.87 - 5.11 MIL/uL   Hemoglobin 9.3 (*) 12.0 - 15.0 g/dL   HCT 16.1 (*) 09.6 - 04.5 %   MCV 83.0  78.0 - 100.0 fL   MCH 26.7  26.0 - 34.0 pg   MCHC 32.2  30.0 - 36.0 g/dL   RDW 40.9  81.1 - 91.4 %   Platelets 154  150 - 400 K/uL  BASIC METABOLIC PANEL      Result Value Ref Range   Sodium 136 (*) 137 - 147 mEq/L   Potassium 3.9  3.7 - 5.3 mEq/L   Chloride 98  96 - 112 mEq/L   CO2 25  19 - 32 mEq/L   Glucose, Bld 279 (*) 70 - 99 mg/dL   BUN 11  6 - 23 mg/dL   Creatinine, Ser 7.82  0.50 -  1.10 mg/dL   Calcium 8.4  8.4 - 95.6 mg/dL   GFR calc non Af Amer 57 (*) >90 mL/min   GFR calc Af Amer 66 (*) >90 mL/min  LIPID PANEL      Result Value Ref Range   Cholesterol 131  0 - 200 mg/dL   Triglycerides 213  <086 mg/dL   HDL 33 (*) >57 mg/dL   Total CHOL/HDL Ratio 4.0     VLDL 28  0 - 40 mg/dL   LDL Cholesterol 70  0 - 99 mg/dL  GLUCOSE, CAPILLARY      Result Value Ref Range   Glucose-Capillary 192 (*) 70 - 99 mg/dL  GLUCOSE, CAPILLARY      Result Value Ref Range   Glucose-Capillary 240 (*) 70 - 99 mg/dL  GLUCOSE, CAPILLARY      Result Value Ref Range   Glucose-Capillary 199 (*) 70 - 99 mg/dL  GLUCOSE, CAPILLARY      Result Value Ref Range   Glucose-Capillary 296 (*) 70 - 99 mg/dL  GLUCOSE, CAPILLARY      Result Value Ref Range   Glucose-Capillary 194 (*) 70 - 99 mg/dL  CBC      Result Value Ref Range   WBC 7.7  4.0 - 10.5 K/uL   RBC 3.32 (*) 3.87 - 5.11 MIL/uL   Hemoglobin 9.1 (*) 12.0 - 15.0 g/dL   HCT 84.6 (*) 96.2 - 95.2 %   MCV 81.9  78.0 - 100.0 fL   MCH 27.4  26.0 - 34.0 pg   MCHC 33.5  30.0 - 36.0 g/dL   RDW 84.1  32.4 - 40.1 %   Platelets 158  150 - 400 K/uL  GLUCOSE, CAPILLARY      Result Value Ref Range   Glucose-Capillary 220 (*) 70 - 99 mg/dL  BASIC METABOLIC PANEL      Result Value Ref Range   Sodium 134 (*) 137 - 147 mEq/L   Potassium 3.9  3.7 - 5.3 mEq/L   Chloride 97  96 - 112 mEq/L   CO2 24  19 - 32 mEq/L   Glucose, Bld 199 (*) 70 - 99 mg/dL   BUN 13  6 - 23 mg/dL   Creatinine, Ser 0.27  0.50 - 1.10 mg/dL   Calcium 8.6  8.4 - 25.3 mg/dL   GFR calc non Af Amer 62 (*) >90 mL/min  GFR calc Af Amer 72 (*) >90 mL/min  GLUCOSE, CAPILLARY      Result Value Ref Range   Glucose-Capillary 178 (*) 70 - 99 mg/dL   Comment 1 Notify RN    GLUCOSE, CAPILLARY      Result Value Ref Range   Glucose-Capillary 196 (*) 70 - 99 mg/dL  CBG MONITORING, ED      Result Value Ref Range   Glucose-Capillary 291 (*) 70 - 99 mg/dL    Discharge Medications:      Medication List    ASK your doctor about these medications       aspirin 81 MG EC tablet  Take 1 tablet (81 mg total) by mouth daily.     atorvastatin 40 MG tablet  Commonly known as:  LIPITOR  Take 1 tablet (40 mg total) by mouth daily.     carvedilol 12.5 MG tablet  Commonly known as:  COREG  Take 1 tablet (12.5 mg total) by mouth 2 (two) times daily with a meal.     furosemide 20 MG tablet  Commonly known as:  LASIX  Take 20 mg by mouth every other day.     glipiZIDE 10 MG tablet  Commonly known as:  GLUCOTROL  Take 1 tablet (10 mg total) by mouth 2 (two) times daily before a meal.     HYDROcodone-acetaminophen 5-325 MG per tablet  Commonly known as:  NORCO/VICODIN  Take 1 tablet by mouth every 6 (six) hours as needed (pain).     levothyroxine 100 MCG tablet  Commonly known as:  SYNTHROID, LEVOTHROID  Take 100 mcg by mouth daily before breakfast.     lisinopril 2.5 MG tablet  Commonly known as:  PRINIVIL,ZESTRIL  Take 2.5 mg by mouth daily.     metFORMIN 750 MG 24 hr tablet  Commonly known as:  GLUCOPHAGE XR  Take 1 tablet (750 mg total) by mouth daily with breakfast.     SYSTANE 0.4-0.3 % Soln  Generic drug:  Polyethyl Glycol-Propyl Glycol  Apply 1 drop to eye at bedtime.     Vitamin D (Ergocalciferol) 50000 UNITS Caps capsule  Commonly known as:  DRISDOL  Take 50,000 Units by mouth every 7 (seven) days. On Monday's        Diagnostic Studies: Dg Chest 1 View  12/03/2013   CLINICAL DATA:  Fall.  Knee fracture.  Preoperative chest x-ray.  EXAM: CHEST - 1 VIEW  COMPARISON:  Two-view chest 10/02/2013  FINDINGS: The heart size is exaggerated by low lung volumes. Lungs are clear. Pacing wires are stable. The visualized soft tissues and bony thorax are unremarkable.  IMPRESSION: 1. Low lung volumes. 2. No acute cardiopulmonary disease or significant interval change.   Electronically Signed   By: Gennette Pac M.D.   On: 12/03/2013 12:00   Ct Knee Right Wo  Contrast  12/03/2013   CLINICAL DATA:  Medial right knee pain, status post fall  EXAM: CT OF THE RIGHT KNEE WITHOUT CONTRAST  TECHNIQUE: Multidetector CT imaging was performed according to the standard protocol. Multiplanar CT image reconstructions were also generated.  COMPARISON:  12/03/2013  FINDINGS: There is generalized osteopenia. There is a severely comminuted, impacted and depressed lateral tibial plateau fracture with a 2.2 x 1.8 cm transverse and 2 cm craniocaudal osseous defect centrally involving the lateral tibial plateau. The lateral fracture fragment is laterally displaced by approximately 13 mm. There is a fracture cleft which extends to the tibial spine. There is no other fracture or  dislocation. There is a large lipohemarthrosis. There is no focal fluid collection or hematoma.  IMPRESSION: Severely comminuted, impacted and depressed lateral tibial plateau fracture as described above. Large lipohemarthrosis.   Electronically Signed   By: Elige Ko   On: 12/03/2013 12:48   Dg Knee Complete 4 Views Right  12/03/2013   CLINICAL DATA:  Traumatic injury and pain  EXAM: RIGHT KNEE - COMPLETE 4+ VIEW  COMPARISON:  None.  FINDINGS: The distal femur is within normal limits. There is a comminuted fracture through the lateral tibial plateau with lateral displacement of some of the fracture fragments. The proximal fibula appears within normal limits.  IMPRESSION: Comminuted lateral tibial plateau fracture   Electronically Signed   By: Alcide Clever M.D.   On: 12/03/2013 10:35   Dg Finger Thumb Right  12/03/2013   CLINICAL DATA:  Fall.  Thumb pain.  Fractured knee.  EXAM: RIGHT THUMB 2+V  COMPARISON:  None available.  FINDINGS: Soft tissue swelling is present at distal from without an acute fracture. The joints are located.  IMPRESSION: Soft tissue swelling in the distal thumb. No acute osseous abnormality.   Electronically Signed   By: Gennette Pac M.D.   On: 12/03/2013 12:05   Dg C-arm 61-120  Min-no Report  12/04/2013   CLINICAL DATA:  Fracture fixation right knee.  EXAM: RIGHT KNEE - 3 VIEW; DG C-ARM 61-120 MIN - NRPT [facility]  COMPARISON:  CT scan left knee 12/03/2013.  FINDINGS: We are provided with 3 fluoroscopic spot views of the right knee. Images demonstrate placement of lateral plate and screws and graft for fixation of the lateral tibial plateau fracture. No acute abnormality is identified.  IMPRESSION: ORIF lateral tibial plateau fracture.   Electronically Signed   By: Drusilla Kanner M.D.   On: 12/04/2013 21:45   Dg Knee 2 Views Right  12/04/2013   CLINICAL DATA:  Fracture fixation right knee.  EXAM: RIGHT KNEE - 3 VIEW; DG C-ARM 61-120 MIN - NRPT [facility]  COMPARISON:  CT scan left knee 12/03/2013.  FINDINGS: We are provided with 3 fluoroscopic spot views of the right knee. Images demonstrate placement of lateral plate and screws and graft for fixation of the lateral tibial plateau fracture. No acute abnormality is identified.  IMPRESSION: ORIF lateral tibial plateau fracture.   Electronically Signed   By: Drusilla Kanner M.D.   On: 12/04/2013 21:45    Disposition: 01-Home or Self Care       Future Appointments Provider Department Dept Phone   01/05/2014 8:45 AM Duke Salvia, MD [facility] [phone]         Signed: Thea Gist 12/07/2013, 7:26 AM

## 2013-12-08 ENCOUNTER — Encounter: Payer: Self-pay | Admitting: Internal Medicine

## 2013-12-08 ENCOUNTER — Non-Acute Institutional Stay (SKILLED_NURSING_FACILITY): Payer: Medicare Other | Admitting: Internal Medicine

## 2013-12-08 DIAGNOSIS — D62 Acute posthemorrhagic anemia: Secondary | ICD-10-CM

## 2013-12-08 DIAGNOSIS — S82109A Unspecified fracture of upper end of unspecified tibia, initial encounter for closed fracture: Secondary | ICD-10-CM | POA: Diagnosis not present

## 2013-12-08 DIAGNOSIS — I2510 Atherosclerotic heart disease of native coronary artery without angina pectoris: Secondary | ICD-10-CM | POA: Diagnosis not present

## 2013-12-08 DIAGNOSIS — R509 Fever, unspecified: Secondary | ICD-10-CM

## 2013-12-08 DIAGNOSIS — S82121A Displaced fracture of lateral condyle of right tibia, initial encounter for closed fracture: Secondary | ICD-10-CM

## 2013-12-08 DIAGNOSIS — I428 Other cardiomyopathies: Secondary | ICD-10-CM | POA: Diagnosis not present

## 2013-12-08 DIAGNOSIS — I420 Dilated cardiomyopathy: Secondary | ICD-10-CM

## 2013-12-08 DIAGNOSIS — E559 Vitamin D deficiency, unspecified: Secondary | ICD-10-CM

## 2013-12-08 DIAGNOSIS — E1149 Type 2 diabetes mellitus with other diabetic neurological complication: Secondary | ICD-10-CM

## 2013-12-08 DIAGNOSIS — I10 Essential (primary) hypertension: Secondary | ICD-10-CM

## 2013-12-08 DIAGNOSIS — E039 Hypothyroidism, unspecified: Secondary | ICD-10-CM

## 2013-12-08 NOTE — Progress Notes (Signed)
Patient ID: [name] [name], female   DOB: 05-Feb-1934, 80 y.o.   MRN: [id number]   Of note in regards to tibial plateau fracture patient does continue on aspirin for anticoagulation- It appears per review of records she possibly had been on Lovenox at one point in the hospital but she was not discharged on this--at this point would be hesitant to restart this since she was not discharged on it--this was discussed with Dr. Leanord Hawking via phone.  I also note her hemoglobin has been trending down in the hospital again will update  lab tomorrow--clinically she remains stable O2 saturation in the 90s on room air

## 2013-12-08 NOTE — Progress Notes (Addendum)
Patient ID: [name] [name], female   DOB: 1934/05/12, 80 y.o.   MRN: [id number]   this is an acute visit.  Level of care skilled.  Facility AF.  Chief complaint-acute visit status post hospitalization for right tibial fracture with repair.  History of present illness.  Patient is a pleasant 80 year old female who was admitted to the hospital recently with right knee pain and found to have a closed fracture of the lateral portion of the right tibial plateau-this was surgically repaired and apparently her postop course was uncomplicated. The have some postop anemia most recent hemoglobin 9.1 on April 28.  She has numerous other medical issues including history of dilated cardiomyopathy CHF with an ejection fraction 20-25%.  Also history of diabetes type 2 hypothyroidism coronary artery disease.  Today she has no complaints apparently when she came last night she had a low-grade temperature of 101.3-apparently this subsided her current temperature is 99.0.  She denies any fever chills shortness of breath cough dysuria.  Previous medical history.  Old fracture lateral portion right tibial plateau repair.  Hypothyroidism.  Type 2 diabetes.  Peripheral neuropathy.  Hyperlipidemia.  Congestive dilated cardiomyopathy.  Chronic systolic heart failure ejection fraction 20-25%.  Hypertension.  Coronary artery disease.  Left bundle branch block.  Postop anemia due to blood loss.  Previous surgeries.  Abdominal hysterectomy 1980.  Insertion replaced and removal of pacemaker 10/01/2013.  Family history.  Apparently a family history of diabetes hypertension and uterine cancer.   social history.  Apparently she quit smoking 10-15 years ago no apparent use of smokeless tobacco alcohol or illicit drug use.  She lives with her son here in [place].  She is a retired Engineer, civil (consulting) having worked in [place] and also in this area before retiring  Hospital  studies.  12/03/2013.  X-ray of the right knee-showed severely comminuted impacted and depressed lateral tibial plateau fracture large lipohemarthrosis  11/24/2013  x-ray showed open reduction internal fixation of lateral tibial plateau fracture.  Review of systems.  In general she denies any fever chills.  Head ears eyes nose mouth and throat-does not complaining of visual changes or sore throat.  Respiratory denies any shortness of breath or cough.  Cardiac denies any chest pain does not complaining of palpitations has no edema lower extremity.  GI-does state she has constipation and has not had a bowel movement in about 5 days does not complaining of any nausea vomiting abdominal discomfort.  GU denies any dysuria or burning with urination.  Muscle skeletal-does not complaining of joint pain or right knee pain at this time.  Neurologic he has a history of peripheral neuropathy but does not complaining of any headache dizziness numbness or tingling at this time.  Psych does not complaining of depression or anxiety.  Physical exam.  Temperature is 99.0 pulse 70 respirations 20 blood pressure 124/60.  In general this is a somewhat frail elderly female in no distress lying comfortably in bed.  Her skin is warm and dry--surgical site right knee Staples are in place without any drainage or bleeding-there is postop erythema but does not appear to be significantly increased from what one would expect status postop-with postop edema again this does not appear to be unusual.  Eyes pupils appear reactive to light visual acuity appears grossly intact.  Oropharynx clear mucous membranes moist.  Chest is clear to auscultation there is no labored breathing somewhat reduced breath sounds at the bases.  Heart is regular rate and rhythm without murmur gallop or  rub.  She does not have significant lower extremity edema pedal pulse is intact.  Abdomen is soft nontender with positive  bowel sounds.  GU could not really appreciate any suprapubic tenderness or distention.  Muscle skeletal Lozol extremities x3 at baseline limited exam since patient is in bed she does have the right leg in an immobilizer there is no lower extremity edema apparent on exam.  Neurologic appears grossly intact no lateralizing findings her speech is clear.  Psych she is alert and oriented pleasant and appropriate.  Labs.  12/07/2013.  WBC 7.7 hemoglobin 9.1 platelets 158.  Sodium 134 potassium 3.9 BUN 13 creatinine 0.88.  12/06/2013.  Cholesterol 131 triglycerides 138 HDL 33 LDL 70.  The 25th 2015.  Hemoglobin A1c 13.1.  04/08/2013.  TSH-4.28.  Assessment plan.  #1-history of tibial plateau fracture status post repair-this is followed by orthopedics apparently this appears to be stable she is working with therapy and apparently the hydrocodone is effective for pain control continue to monitor.---  also has Robaxin as needed for muscle spasms  #2 history of systolic CHF-she is on Lasix every other day at this point appears to be clinically stable she continues on Coreg as well as lisinopril.  Monitor weights Monday Wednesday Friday notify provider gain greater than 3 pounds.  #3-diabetes type 2 is on Glucophage as well as glipizide-so far we have minimal readings they have been in the mid-200s-we'll monitor her consider possible dose adjustments once we get a few more readings.  Hemoglobin A1c was significantly elevated in hospital.  #4-history of hypothyroidism-she is on Synthroid we'll update TSH.  #5-coronary artery disease-she continues on aspirin is also on a statin.  #6 history of vitamin D deficiency she is on supplementation.   #7-low-grade fever intermittent-she does not really show signs of any overt infection at this time-however certainly with recent surgery is at risk for respiratory issues will obtain a chest x-ray-also obtain a UA CandS-also will order blood  work including a CBC with differential and basic metabolic panel    Stat in the morning --monitor vs with pulse Q4 hours  x3 then Q shift---notify provider of temperature greater than 100.0-- Monitor her surgical site if any increased edema erythema or drainage notify provider  #8-constipation-Will order abdominal x-ray--also start Senokot 1 tab each bedtime and monitor  [id number]-of note greater than 35 minutes spent assessing patient- discussing her status with nursing staff including the wound care nurse-reviewing her medical records-and formulating and coordinating plan of care-of note greater than 50% of time spent coordinating plan of care  Addendum-I do note for anticoagulation for status post tibial plateau fracture repair she is on aspirin-it appears possibly at some point in the hospital she had been on Lovenox but was not discharged on this--at this point would be hesitant to restart this since he was not discharged on it-this was discussed with Dr. Leanord Hawkingobson via phone.  Clinically she remains stable with O2 saturations in the 90s on room air  .

## 2013-12-09 ENCOUNTER — Non-Acute Institutional Stay (SKILLED_NURSING_FACILITY): Payer: Medicare Other | Admitting: Internal Medicine

## 2013-12-09 ENCOUNTER — Encounter (HOSPITAL_COMMUNITY): Payer: Self-pay | Admitting: Orthopedic Surgery

## 2013-12-09 DIAGNOSIS — I10 Essential (primary) hypertension: Secondary | ICD-10-CM

## 2013-12-09 DIAGNOSIS — G609 Hereditary and idiopathic neuropathy, unspecified: Secondary | ICD-10-CM

## 2013-12-09 DIAGNOSIS — S82109A Unspecified fracture of upper end of unspecified tibia, initial encounter for closed fracture: Secondary | ICD-10-CM

## 2013-12-09 DIAGNOSIS — E039 Hypothyroidism, unspecified: Secondary | ICD-10-CM | POA: Diagnosis not present

## 2013-12-09 DIAGNOSIS — S82121A Displaced fracture of lateral condyle of right tibia, initial encounter for closed fracture: Secondary | ICD-10-CM

## 2013-12-10 ENCOUNTER — Non-Acute Institutional Stay (SKILLED_NURSING_FACILITY): Payer: Medicare Other | Admitting: Internal Medicine

## 2013-12-10 ENCOUNTER — Other Ambulatory Visit: Payer: Self-pay | Admitting: *Deleted

## 2013-12-10 DIAGNOSIS — E1149 Type 2 diabetes mellitus with other diabetic neurological complication: Secondary | ICD-10-CM

## 2013-12-10 DIAGNOSIS — S82109A Unspecified fracture of upper end of unspecified tibia, initial encounter for closed fracture: Secondary | ICD-10-CM | POA: Diagnosis not present

## 2013-12-10 DIAGNOSIS — G609 Hereditary and idiopathic neuropathy, unspecified: Secondary | ICD-10-CM | POA: Insufficient documentation

## 2013-12-10 DIAGNOSIS — I420 Dilated cardiomyopathy: Secondary | ICD-10-CM

## 2013-12-10 DIAGNOSIS — I428 Other cardiomyopathies: Secondary | ICD-10-CM

## 2013-12-10 DIAGNOSIS — S82121A Displaced fracture of lateral condyle of right tibia, initial encounter for closed fracture: Secondary | ICD-10-CM

## 2013-12-10 MED ORDER — HYDROCODONE-ACETAMINOPHEN 5-325 MG PO TABS: ORAL_TABLET | ORAL | Status: DC

## 2013-12-10 NOTE — Telephone Encounter (Signed)
rx faxed to [facility] [place] [facility] @ [phone]. 

## 2013-12-10 NOTE — Progress Notes (Signed)
HISTORY & PHYSICAL  DATE: 12/09/2013   FACILITY: [facility]  LEVEL OF CARE: SNF (31)  ALLERGIES:  Allergies  Allergen Reactions  . Statins   . Vytorin [Ezetimibe-Simvastatin] Other (See Comments)    Leg cramps    CHIEF COMPLAINT:  Manage right tibial plateau fracture, hypothyroidism and peripheral neuropathy  HISTORY OF PRESENT ILLNESS: Patient is a 80 year old African American female.  TIBIAL PLATEAU FX: The patient presented with right knee pain and was diagnosed of close fracture of lateral portion of the right tibial plateau. She underwent open reduction and internal fixation of the fracture and tolerated the procedure well. She is currently has an immobilizer in the right lower extremity. She is admitted to this facility for short-term rehabilitation. She denies right knee pain. Patient is tolerating pain medications without any adverse reactions.  HYPOTHYROIDISM: The hypothyroidism remains stable. No complications noted from the medications presently being used.  The patient denies fatigue or constipation.  Last TSH not available.  PERIPHERAL NEUROPATHY: The peripheral neuropathy is stable. The patient denies pain in the feet, tingling, and numbness. No complications noted from the medication presently being used.  PAST MEDICAL HISTORY :  Past Medical History  Diagnosis Date  . Hypothyroidism   . High cholesterol   . Osteoarthritis   . Chronic combined systolic and diastolic CHF, NYHA class 3     a. 03/2013 Echo: EF 25-30%, diff HK, sev antsept HK, mild MR.  . Congestive dilated cardiomyopathy     a. 03/2013 Echo: EF 25-30%;  b. 09/2013 s/p SJM 3242 CRT-P.  Marland Kitchen Left bundle branch block   . Type II diabetes mellitus   . GERD (gastroesophageal reflux disease)   . Coronary artery disease     a. 08/2012 Cath: LM nl, LAD 3m, LCX nondom, mild-mod nonobs dzs mid, OM1 ok, OM2 90/90p, RCA 40, EF 25-30%-->Med Rx.    PAST SURGICAL  HISTORY: Past Surgical History  Procedure Laterality Date  . Abdominal hysterectomy  1980  . Bmd  2006  . Bilateral cateract surgery    . Bi-ventricular pacemaker insertion (crt-p)      a. 09/2013 s/p SJM 3242 CRT-P.  . Orif tibia plateau Right 12/04/2013    Procedure: OPEN REDUCTION INTERNAL FIXATION (ORIF) TIBIAL PLATEAU;  Surgeon: Verlee Rossetti, MD;  Location: [facility];  Service: Orthopedics;  Laterality: Right;    SOCIAL HISTORY:  reports that she has quit smoking. She has never used smokeless tobacco. She reports that she does not drink alcohol or use illicit drugs.  FAMILY HISTORY:  Family History  Problem Relation Age of Onset  . Diabetes Other   . Hypertension Other   . Uterine cancer Other   . Diabetes Mother   . Heart attack Brother     CURRENT MEDICATIONS: Reviewed per MAR/see medication list  REVIEW OF SYSTEMS:  See HPI otherwise 14 point ROS is negative.  PHYSICAL EXAMINATION  VS:  See VS section  GENERAL: no acute distress, normal body habitus EYES: conjunctivae normal, sclerae normal, normal eye lids MOUTH/THROAT: lips without lesions,no lesions in the mouth,tongue is without lesions,uvula elevates in midline NECK: supple, trachea midline, no neck masses, no thyroid tenderness, no thyromegaly LYMPHATICS: no LAN in the neck, no supraclavicular LAN RESPIRATORY: breathing is even & unlabored, BS CTAB CARDIAC: RRR, no murmur,no extra heart sounds, no edema GI:  ABDOMEN: abdomen soft, normal BS, no masses, no tenderness  LIVER/SPLEEN: no hepatomegaly, no splenomegaly MUSCULOSKELETAL: HEAD:  normal to inspection & palpation BACK: no kyphosis, scoliosis or spinal processes tenderness EXTREMITIES: LEFT UPPER EXTREMITY: full range of motion, normal strength & tone RIGHT UPPER EXTREMITY:  full range of motion, normal strength & tone LEFT LOWER EXTREMITY:  Moderate range of motion, normal strength & tone RIGHT LOWER EXTREMITY:  In immobilizer PSYCHIATRIC: the  patient is alert & oriented to person, affect & behavior appropriate  LABS/RADIOLOGY:  Labs reviewed: Basic Metabolic Panel:  Recent Labs  16/05/9603/26/15 0533 12/06/13 0425 12/07/13 0430  NA 138 136* 134*  K 3.9 3.9 3.9  CL 101 98 97  CO2 23 25 24   GLUCOSE 175* 279* 199*  BUN 12 11 13   CREATININE 0.83 0.94 0.88  CALCIUM 8.4 8.4 8.6   CBC:  Recent Labs  09/17/13 1457 12/03/13 1133  12/05/13 0533 12/06/13 0425 12/07/13 0430  WBC 7.3 6.6  < > 8.2 8.3 7.7  NEUTROABS 3.0 3.6  --   --   --   --   HGB 13.0 12.7  < > 9.7* 9.3* 9.1*  HCT 40.4 38.9  < > 30.9* 28.9* 27.2*  MCV 83.8 80.7  < > 82.6 83.0 81.9  PLT 252.0 182  < > 148* 154 158  < > = values in this interval not displayed.  Lipid Panel:  Recent Labs  04/08/13 1606 12/06/13 0425  HDL 40.70 33*   Cardiac Enzymes:  Recent Labs  12/04/13 1050  TROPONINI <0.30   CBG:  Recent Labs  12/06/13 1735 12/06/13 2142 12/07/13 0717  GLUCAP 220* 178* 196*    RIGHT KNEE - COMPLETE 4+ VIEW   COMPARISON:  None.   FINDINGS: The distal femur is within normal limits. There is a comminuted fracture through the lateral tibial plateau with lateral displacement of some of the fracture fragments. The proximal fibula appears within normal limits.   IMPRESSION: Comminuted lateral tibial plateau fracture   CHEST - 1 VIEW   COMPARISON:  Two-view chest 10/02/2013   FINDINGS: The heart size is exaggerated by low lung volumes. Lungs are clear. Pacing wires are stable. The visualized soft tissues and bony thorax are unremarkable.   IMPRESSION: 1. Low lung volumes. 2. No acute cardiopulmonary disease or significant interval change.     RIGHT THUMB 2+V   COMPARISON:  None available.   FINDINGS: Soft tissue swelling is present at distal from without an acute fracture. The joints are located.   IMPRESSION: Soft tissue swelling in the distal thumb. No acute osseous abnormality.   CT OF THE RIGHT KNEE WITHOUT  CONTRAST   TECHNIQUE: Multidetector CT imaging was performed according to the standard protocol. Multiplanar CT image reconstructions were also generated.   COMPARISON:  12/03/2013   FINDINGS: There is generalized osteopenia. There is a severely comminuted, impacted and depressed lateral tibial plateau fracture with a 2.2 x 1.8 cm transverse and 2 cm craniocaudal osseous defect centrally involving the lateral tibial plateau. The lateral fracture fragment is laterally displaced by approximately 13 mm. There is a fracture cleft which extends to the tibial spine. There is no other fracture or dislocation. There is a large lipohemarthrosis. There is no focal fluid collection or hematoma.   IMPRESSION: Severely comminuted, impacted and depressed lateral tibial plateau fracture as described above. Large lipohemarthrosis.   RIGHT KNEE - 3 VIEW; DG C-ARM 61-120 MIN - NRPT [facility]   COMPARISON:  CT scan left knee 12/03/2013.   FINDINGS: We are provided with 3 fluoroscopic spot views of the right knee. Images demonstrate  placement of lateral plate and screws and graft for fixation of the lateral tibial plateau fracture. No acute abnormality is identified.   IMPRESSION: ORIF lateral tibial plateau fracture.   RIGHT KNEE - 3 VIEW; DG C-ARM 61-120 MIN - NRPT [facility]   COMPARISON:  CT scan left knee 12/03/2013.   FINDINGS: We are provided with 3 fluoroscopic spot views of the right knee. Images demonstrate placement of lateral plate and screws and graft for fixation of the lateral tibial plateau fracture. No acute abnormality is identified.   IMPRESSION: ORIF lateral tibial plateau fracture.    ASSESSMENT/PLAN:  Right tibial plateau fracture-status post ORIF. Continue rehabilitation. peripheral neuropathy-denies pain Hypothyroidism-continue levothyroxine Hypertension-blood pressure borderline CHF-well controlled CAD-stable  I have reviewed patient's medical records received at  admission/from hospitalization.  CPT CODE: 76226  Angela Cox, MD [facility] [phone]

## 2013-12-15 NOTE — Progress Notes (Addendum)
Patient ID: [name] [name], female   DOB: 1933-10-18, 80 y.o.   MRN: [id number]                  PROGRESS NOTE  DATE:  12/10/2013    FACILITY: [facility]    LEVEL OF CARE:   SNF   Acute Visit   CHIEF COMPLAINT:  Review of medical issues, requested by the patient.    HISTORY OF PRESENT ILLNESS:  [name] [name] is a lady who suffered a fall after getting out of a chair and suffered a closed fracture of the lateral portion of her right tibial plateau.  She had an ORIF done by Orthopedics on 12/03/2013.    She is a type 2 diabetic and identifies herself as having diabetic peripheral neuropathy.    She also has a history of a chronic dilated cardiomyopathy.  Echocardiogram in August 2014 showed an EF of 25-30%.  She had diffuse hypokinesis and severe hypokinesis of the anteroseptal area.  She has been seen by Dr. Graciela Husbands.  She does not have valvular heart disease.  She has a biventricular pacemaker, implanted in February 2015.    The patient tells me that she does have what sounds to be orthostatic hypotension.  She is lightheaded when she gets up quickly out of the bed.    As mentioned, she has diabetic neuropathy.  She should be checked for autonomic neuropathy and I will try to do that next week.    PHYSICAL EXAMINATION:   GENERAL APPEARANCE:  The patient looks well.   CHEST/RESPIRATORY:  Clear air entry bilaterally.   CARDIOVASCULAR:  CARDIAC:   Heart sounds are normal.  There are no murmurs.  No gallops.  Her JVP is not elevated.    ASSESSMENT/PLAN:  Congestive dilated cardiomyopathy with an EF of 25-30%.  She appears to be euvolemic and this is compensated at the moment.  No change of existing medications.    Postural symptoms.  I will check her for significant orthostatic hypotension.  She does have resting hypertension and tells me she was recently put on an ACE inhibitor.

## 2013-12-16 DIAGNOSIS — S82409A Unspecified fracture of shaft of unspecified fibula, initial encounter for closed fracture: Secondary | ICD-10-CM | POA: Diagnosis not present

## 2013-12-23 NOTE — Progress Notes (Signed)
This encounter was created in error - please disregard.

## 2014-01-02 IMAGING — CR DG CHEST 1V PORT
1 series · 1 of 1 positions shown · non-contrast
Comparison: Chest CT 07/30/2012

CLINICAL DATA: Pulmonary edema.

PORTABLE CHEST - 1 VIEW

[AP]
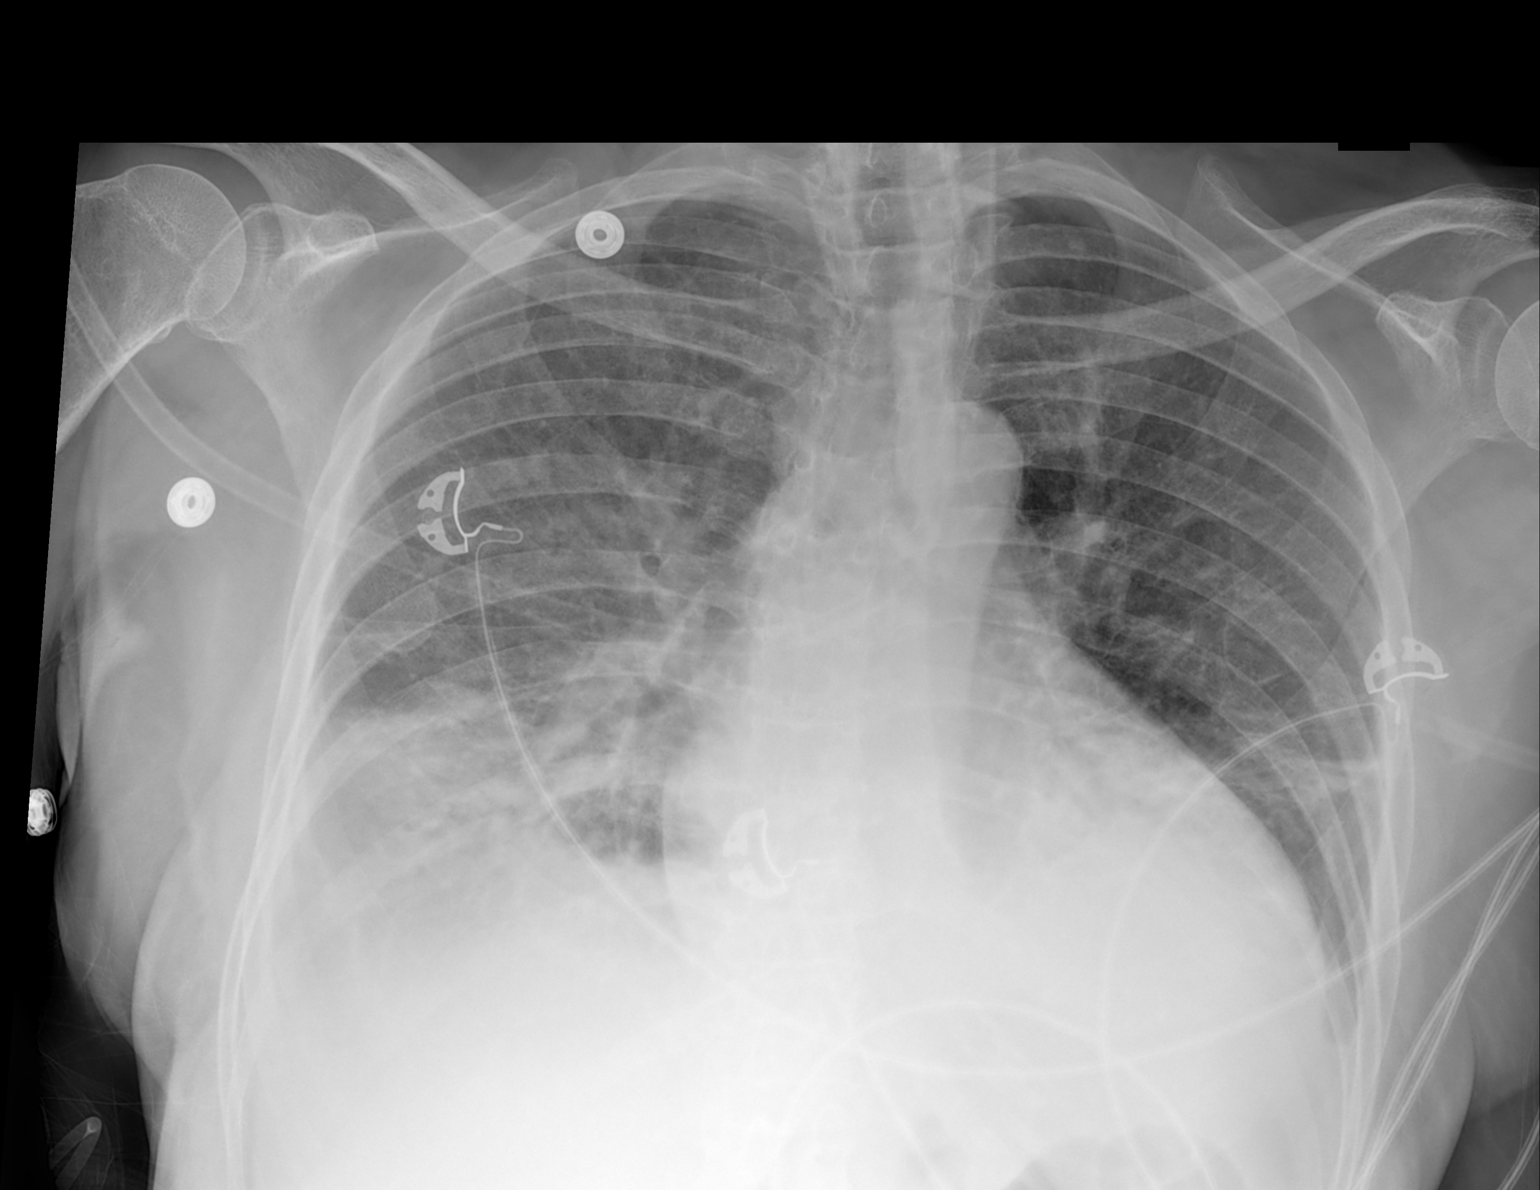

[1 of 1 positions shown; findings below may reference images not displayed]

FINDINGS: Single view of the chest again demonstrates bibasilar
densities.  Findings are suggestive for a combination of pleural
fluid and volume loss.  Concern for airspace disease in the right
lower lung.  Heart size is within normal limits.  Negative for a
pneumothorax.
IMPRESSION: Bibasilar densities.  Findings suggest a combination of pleural
fluid and volume loss.  There is also concern for airspace disease
in the right lower lung.

## 2014-01-03 ENCOUNTER — Encounter: Payer: Self-pay | Admitting: Internal Medicine

## 2014-01-03 ENCOUNTER — Non-Acute Institutional Stay (SKILLED_NURSING_FACILITY): Payer: Medicare Other | Admitting: Internal Medicine

## 2014-01-03 DIAGNOSIS — S82143A Displaced bicondylar fracture of unspecified tibia, initial encounter for closed fracture: Secondary | ICD-10-CM

## 2014-01-03 DIAGNOSIS — E039 Hypothyroidism, unspecified: Secondary | ICD-10-CM | POA: Diagnosis not present

## 2014-01-03 DIAGNOSIS — S82109A Unspecified fracture of upper end of unspecified tibia, initial encounter for closed fracture: Secondary | ICD-10-CM

## 2014-01-03 DIAGNOSIS — I5022 Chronic systolic (congestive) heart failure: Secondary | ICD-10-CM | POA: Diagnosis not present

## 2014-01-03 DIAGNOSIS — E1149 Type 2 diabetes mellitus with other diabetic neurological complication: Secondary | ICD-10-CM

## 2014-01-03 DIAGNOSIS — I2510 Atherosclerotic heart disease of native coronary artery without angina pectoris: Secondary | ICD-10-CM | POA: Diagnosis not present

## 2014-01-03 NOTE — Progress Notes (Signed)
Patient ID: [name] [name], female   DOB: 30-Jan-1934, 80 y.o.   MRN: [id number]   this is a discharge note.  Level of care skilled.  Facility AF.   Chief complaint-discharge note .  History of present illness.  Patient is a pleasant 80 year old female who was admitted to the hospital recently with right knee pain and found to have a closed fracture of the lateral portion of the right tibial plateau-this was surgically repaired and apparently her postop course was uncomplicated. The have some postop anemia most recent hemoglobin 9.2 on April 30 .  She has numerous other medical issues including history of dilated cardiomyopathy CHF with an ejection fraction 20-25%--this has been relatively stable during her stay here .  Also history of diabetes type 2 hypothyroidism coronary artery disease. All these have been relatively stable-TSH did come back elevated at over 33 and her Synthroid was increased with an update a TSH ordered in approximately 3 weeks this will need followup by primary care provider.  She is a type II diabetic on Glucophage as well as glipizide --her blood sugars appear to be somewhat variable ranging from 108-lower 200s in the morning at noon-- largely in the 200s range with 300s at times--at 4:30 ranging 109 to higher 200s.     Previous medical history .  Old fracture lateral portion right tibial plateau repair.  Hypothyroidism.  Type 2 diabetes.  Peripheral neuropathy.  Hyperlipidemia.  Congestive dilated cardiomyopathy.  Chronic systolic heart failure ejection fraction 20-25%.  Hypertension.  Coronary artery disease.  Left bundle branch block.  Postop anemia due to blood loss .  Previous surgeries.   Abdominal hysterectomy 1980.  Insertion replaced and removal of pacemaker 10/01/2013.   Family history.  Apparently a family history of diabetes hypertension and uterine cancer .  social history.  Apparently she quit smoking 10-15 years ago no apparent use of  smokeless tobacco alcohol or illicit drug use.  She lives with her son here in [place].  She is a retired Engineer, civil (consulting) having worked in [place] and also in this area before retiring   Hospital studies.  12/03/2013.  X-ray of the right knee-showed severely comminuted impacted and depressed lateral tibial plateau fracture large lipohemarthrosis  11/24/2013 x-ray showed open reduction internal fixation of lateral tibial plateau fracture .  Review of systems.  In general she denies any fever chills.  Head ears eyes nose mouth and throat-does not complaining of visual changes or sore throat.  Respiratory denies any shortness of breath or cough.  Cardiac denies any chest pain does not complaining of palpitations has mild edema  right lower extremity.  GI-constipation earlier in her stay has resolved she had a BM this morning she is on Senokot does not complaining of any nausea vomiting abdominal discomfort.  GU denies any dysuria or burning with urination.  Muscle skeletal-does not complaining of joint pain or right knee pain at this time.  Neurologic -- has a history of peripheral neuropathy but does not complaining of any headache dizziness numbness or tingling at this time.  Psych does not complaining of depression or anxiety .  Physical exam.  Temperature 98.0 pulse 72 respirations 18 blood pressure 140/54-131/68-recently in this range.  In general this is a somewhat frail elderly female in no distress  Her skin is warm and dry--surgical site right knee has dried crusting without warmth this appears to be healing unremarkably  Eyes pupils appear reactive to light visual acuity appears grossly intact.  Oropharynx clear mucous  membranes moist.  Chest is clear to auscultation there is no labored breathing somewhat reduced breath sounds at the bases.  Heart is regular rate and rhythm without murmur gallop or rub.  She has trace pedal edema on the right-pedal pulse is intact area is nontender  nonerythematous.  Abdomen is soft nontender with positive bowel sounds.  GU could not really appreciate any suprapubic tenderness or distention.  Muscle skeletal--moves all extremities x4 she is still nonweightbearing on her right lower extremity she does ambulate in a wheelchair also uses a cane and walker at times states she stays off her right leg however .  Neurologic appears grossly intact no lateralizing findings her speech is clear.  Psych she is alert and oriented pleasant and appropriate.   Labs 12/09/2013.  Sodium 135 potassium 4.2 BUN 13 creatinine 0.7.  ZOX-09.604TSH-33.607.  WBC 6.0 hemoglobin 9.2 platelets 249.  .  12/07/2013.  WBC 7.7 hemoglobin 9.1 platelets 158.  Sodium 134 potassium 3.9 BUN 13 creatinine 0.88.  12/06/2013.  Cholesterol 131 triglycerides 138 HDL 33 LDL 70.  The 25th 2015.  Hemoglobin A1c 13.1.  04/08/2013.  TSH-4.28 .  Assessment plan.  #1-history of tibial plateau fracture status post repair-this is followed by orthopedics apparently this appears to be stable she is working with therapy and apparently the hydrocodone is effective for pain control continue to monitor.--- also has Robaxin as needed for muscle spasms--he continues to be nonweightbearing this is followed by orthopedics she will need continued PT and OT as well as a rolling walker and bedside commode secondary to limited ambulation ability   #2 history of systolic CHF-she is on Lasix every other day at this point appears to be clinically stable she continues on Coreg as well as lisinopril.--L. update metabolic panel    #3-diabetes type 2 is on Glucophage as well as glipizide-she has some variability in her sugars with higher readings later in the day at this point secondary to patient going home tomorrow would be hesitant to be  aggressive here and risk hypoglycemia at home-- but will need followup certainly by her primary care provider .  Hemoglobin A1c was significantly elevated in hospital.    #4-history of hypothyroidism-she is on Synthroid this has been increased during her stay in the facility update TSH is pending this will have to be followed up by primary care provider   #5-coronary artery disease-she continues on aspirin is also on a statin.   #6 history of vitamin D deficiency she is on supplementation.  #7-history constipation this appears resolved she is on Senokot.  #8 history of some mild right pedal edema will order a venous Doppler to rule out DVT although my suspicion is low she is about to be discharged and would like to rule this out  Of note she will be going home with family when the nursing support follow up her medical issues as well as continued PT and OT as well as a rolling walker and bedside commode secondary to her limited ambulation with history of tibial plateau fracture  Also will order a CBC and basic metabolic panel before discharge to ensure stability  CPT-99316-of note greater than 30 minutes spent preparing this discharge summary scripts have been written                    .

## 2014-01-05 ENCOUNTER — Ambulatory Visit (INDEPENDENT_AMBULATORY_CARE_PROVIDER_SITE_OTHER): Payer: Medicare Other | Admitting: Internal Medicine

## 2014-01-05 ENCOUNTER — Encounter: Payer: Self-pay | Admitting: Internal Medicine

## 2014-01-05 VITALS — BP 136/70 | HR 72 | Ht 63.0 in | Wt 134.8 lb

## 2014-01-05 DIAGNOSIS — I2510 Atherosclerotic heart disease of native coronary artery without angina pectoris: Secondary | ICD-10-CM | POA: Diagnosis not present

## 2014-01-05 DIAGNOSIS — I420 Dilated cardiomyopathy: Secondary | ICD-10-CM

## 2014-01-05 DIAGNOSIS — M6281 Muscle weakness (generalized): Secondary | ICD-10-CM | POA: Diagnosis not present

## 2014-01-05 DIAGNOSIS — E1149 Type 2 diabetes mellitus with other diabetic neurological complication: Secondary | ICD-10-CM | POA: Diagnosis not present

## 2014-01-05 DIAGNOSIS — I428 Other cardiomyopathies: Secondary | ICD-10-CM

## 2014-01-05 DIAGNOSIS — I10 Essential (primary) hypertension: Secondary | ICD-10-CM | POA: Diagnosis not present

## 2014-01-05 DIAGNOSIS — Z950 Presence of cardiac pacemaker: Secondary | ICD-10-CM

## 2014-01-05 DIAGNOSIS — I5022 Chronic systolic (congestive) heart failure: Secondary | ICD-10-CM

## 2014-01-05 DIAGNOSIS — S8290XD Unspecified fracture of unspecified lower leg, subsequent encounter for closed fracture with routine healing: Secondary | ICD-10-CM | POA: Diagnosis not present

## 2014-01-05 LAB — MDC_IDC_ENUM_SESS_TYPE_INCLINIC
Battery Remaining Longevity: 50.4 mo
Brady Statistic RA Percent Paced: 20 %
Brady Statistic RV Percent Paced: 99.64 %
Implantable Pulse Generator Model: 3242
Implantable Pulse Generator Serial Number: 7578583
Lead Channel Impedance Value: 437.5 Ohm
Lead Channel Impedance Value: 462.5 Ohm
Lead Channel Pacing Threshold Amplitude: 1.125 V
Lead Channel Pacing Threshold Amplitude: 2.5 V
Lead Channel Pacing Threshold Pulse Width: 0.4 ms
Lead Channel Pacing Threshold Pulse Width: 0.4 ms
Lead Channel Sensing Intrinsic Amplitude: 12 mV
Lead Channel Setting Pacing Amplitude: 1.625
Lead Channel Setting Pacing Pulse Width: 0.4 ms
Lead Channel Setting Sensing Sensitivity: 2 mV
MDC IDC MSMT BATTERY VOLTAGE: 2.99 V
MDC IDC MSMT LEADCHNL LV IMPEDANCE VALUE: 700 Ohm
MDC IDC MSMT LEADCHNL LV PACING THRESHOLD PULSEWIDTH: 1 ms
MDC IDC MSMT LEADCHNL RA PACING THRESHOLD AMPLITUDE: 0.625 V
MDC IDC MSMT LEADCHNL RA SENSING INTR AMPL: 2.6 mV
MDC IDC SESS DTM: 20150527092018
MDC IDC SET LEADCHNL LV PACING AMPLITUDE: 3.5 V
MDC IDC SET LEADCHNL LV PACING PULSEWIDTH: 1 ms
MDC IDC SET LEADCHNL RV PACING AMPLITUDE: 2.125

## 2014-01-05 NOTE — Patient Instructions (Signed)
Your physician recommends that you continue on your current medications as directed. Please refer to the Current Medication list given to you today.  Remote monitoring is used to monitor your Pacemaker of ICD from home. This monitoring reduces the number of office visits required to check your device to one time per year. It allows [place] to keep an eye on the functioning of your device to ensure it is working properly. You are scheduled for a device check from home on 04/12/14. You may send your transmission at any time that day. If you have a wireless device, the transmission will be sent automatically. After your physician reviews your transmission, you will receive a postcard with your next transmission date.  Your physician wants you to follow-up in: 1 year with Dr. Graciela Husbands.  You will receive a reminder letter in the mail two months in advance. If you don't receive a letter, please call our office to schedule the follow-up appointment.

## 2014-01-05 NOTE — Progress Notes (Signed)
Patient Care Team: Etta Grandchildhomas L Jones, MD as PCP - General   HPI  [name] [name] is a 80 y.o. female Seen in followup for congestive heart failure status post recent CRT implantation (2/15) she has nonischemic cardiomyopathy.  There had been interval significant improvement in congestive symptoms following CRT implantation   She s subsequently tripped and broke her tibial plateau required surgical repair and rehabilitation. She is still not weightbearing.    Past Medical History  Diagnosis Date  . Hypothyroidism   . High cholesterol   . Osteoarthritis   . Chronic combined systolic and diastolic CHF, NYHA class 3     a. 03/2013 Echo: EF 25-30%, diff HK, sev antsept HK, mild MR.  . Congestive dilated cardiomyopathy     a. 03/2013 Echo: EF 25-30%;  b. 09/2013 s/p SJM 3242 CRT-P.  Marland Kitchen. Left bundle branch block   . Type II diabetes mellitus   . GERD (gastroesophageal reflux disease)   . Coronary artery disease     a. 08/2012 Cath: LM nl, LAD 3953m, LCX nondom, mild-mod nonobs dzs mid, OM1 ok, OM2 90/90p, RCA 40, EF 25-30%-->Med Rx.    Past Surgical History  Procedure Laterality Date  . Abdominal hysterectomy  1980  . Bmd  2006  . Bilateral cateract surgery    . Bi-ventricular pacemaker insertion (crt-p)      a. 09/2013 s/p SJM 3242 CRT-P.  . Orif tibia plateau Right 12/04/2013    Procedure: OPEN REDUCTION INTERNAL FIXATION (ORIF) TIBIAL PLATEAU;  Surgeon: Verlee RossettiSteven R Norris, MD;  Location: [facility];  Service: Orthopedics;  Laterality: Right;    Current Outpatient Prescriptions  Medication Sig Dispense Refill  . aspirin EC 81 MG EC tablet Take 1 tablet (81 mg total) by mouth daily.  30 tablet  0  . atorvastatin (LIPITOR) 40 MG tablet Take 1 tablet (40 mg total) by mouth daily.  90 tablet  3  . carvedilol (COREG) 12.5 MG tablet Take 1 tablet (12.5 mg total) by mouth 2 (two) times daily with a meal.  180 tablet  6  . furosemide (LASIX) 20 MG tablet Take 20 mg by mouth every other day.        Marland Kitchen. glipiZIDE (GLUCOTROL) 10 MG tablet Take 1 tablet (10 mg total) by mouth 2 (two) times daily before a meal.  180 tablet  1  . HYDROcodone-acetaminophen (NORCO/VICODIN) 5-325 MG per tablet Take 1 tablet by mouth every 4 hours as needed *DO NOT EXCEED 3 GM APAP/24 HRS*  180 tablet  0  . levothyroxine (SYNTHROID, LEVOTHROID) 100 MCG tablet Take 125 mcg by mouth daily before breakfast. 125 mcg daily      . lisinopril (PRINIVIL,ZESTRIL) 2.5 MG tablet Take 2.5 mg by mouth daily.      . metFORMIN (GLUCOPHAGE-XR) 750 MG 24 hr tablet Take 1 tablet (750 mg total) by mouth daily with breakfast.  30 tablet  0  . methocarbamol (ROBAXIN) 500 MG tablet Take 1 tablet (500 mg total) by mouth every 6 (six) hours as needed for muscle spasms.  60 tablet  0  . Polyethyl Glycol-Propyl Glycol (SYSTANE) 0.4-0.3 % SOLN Apply 1 drop to eye at bedtime.      . Vitamin D, Ergocalciferol, (DRISDOL) 50000 UNITS CAPS capsule Take 50,000 Units by mouth every 7 (seven) days. On Monday's       No current facility-administered medications for this visit.    Allergies  Allergen Reactions  . Statins   . Vytorin [Ezetimibe-Simvastatin]  Other (See Comments)    Leg cramps    Review of Systems negative except from HPI and PMH  Physical Exam BP 136/70  Pulse 72  Ht 5\' 3"  (1.6 m)  Wt 134 lb 12.8 oz (61.145 kg)  BMI 23.88 kg/m2 Well developed and well nourished in no acute distress   HENT normal E scleral and icterus clear Neck Supple JVP flat; carotids brisk and full Clear to ausculation Device pocket well healed; without hematoma or erythema.  There is no tetheringRegular rate and rhythm, no murmurs gallops or rub Soft with active bowel sounds No clubbing cyanosis   she is sitting in a wheelchair  no Edema Alert and oriented, grossly normal motor and sensory function Skin Warm and Dry    Assessment and  Plan'  Nonischemic cardiomyopathy  Congestive heart failure-chronic systolic   CRT P.-St. Jude  The  patient's device was interrogated.  The information was reviewed. No changes were made in the programming.   SHe is euvolemic. Will continue current medications

## 2014-01-07 DIAGNOSIS — M6281 Muscle weakness (generalized): Secondary | ICD-10-CM | POA: Diagnosis not present

## 2014-01-07 DIAGNOSIS — S8290XD Unspecified fracture of unspecified lower leg, subsequent encounter for closed fracture with routine healing: Secondary | ICD-10-CM | POA: Diagnosis not present

## 2014-01-10 ENCOUNTER — Telehealth: Payer: Self-pay | Admitting: *Deleted

## 2014-01-10 NOTE — Telephone Encounter (Signed)
A user error has taken place.

## 2014-01-11 ENCOUNTER — Ambulatory Visit: Payer: Medicare Other | Admitting: Internal Medicine

## 2014-01-11 DIAGNOSIS — S8290XD Unspecified fracture of unspecified lower leg, subsequent encounter for closed fracture with routine healing: Secondary | ICD-10-CM | POA: Diagnosis not present

## 2014-01-11 DIAGNOSIS — M6281 Muscle weakness (generalized): Secondary | ICD-10-CM | POA: Diagnosis not present

## 2014-01-13 DIAGNOSIS — M6281 Muscle weakness (generalized): Secondary | ICD-10-CM | POA: Diagnosis not present

## 2014-01-13 DIAGNOSIS — S8290XD Unspecified fracture of unspecified lower leg, subsequent encounter for closed fracture with routine healing: Secondary | ICD-10-CM | POA: Diagnosis not present

## 2014-01-14 DIAGNOSIS — M6281 Muscle weakness (generalized): Secondary | ICD-10-CM | POA: Diagnosis not present

## 2014-01-14 DIAGNOSIS — S8290XD Unspecified fracture of unspecified lower leg, subsequent encounter for closed fracture with routine healing: Secondary | ICD-10-CM | POA: Diagnosis not present

## 2014-01-19 DIAGNOSIS — S8290XD Unspecified fracture of unspecified lower leg, subsequent encounter for closed fracture with routine healing: Secondary | ICD-10-CM | POA: Diagnosis not present

## 2014-01-19 DIAGNOSIS — Z9889 Other specified postprocedural states: Secondary | ICD-10-CM | POA: Diagnosis not present

## 2014-01-20 DIAGNOSIS — M6281 Muscle weakness (generalized): Secondary | ICD-10-CM | POA: Diagnosis not present

## 2014-01-20 DIAGNOSIS — S8290XD Unspecified fracture of unspecified lower leg, subsequent encounter for closed fracture with routine healing: Secondary | ICD-10-CM | POA: Diagnosis not present

## 2014-01-21 DIAGNOSIS — S8290XD Unspecified fracture of unspecified lower leg, subsequent encounter for closed fracture with routine healing: Secondary | ICD-10-CM | POA: Diagnosis not present

## 2014-01-21 DIAGNOSIS — M6281 Muscle weakness (generalized): Secondary | ICD-10-CM | POA: Diagnosis not present

## 2014-01-25 DIAGNOSIS — S8290XD Unspecified fracture of unspecified lower leg, subsequent encounter for closed fracture with routine healing: Secondary | ICD-10-CM | POA: Diagnosis not present

## 2014-01-25 DIAGNOSIS — M6281 Muscle weakness (generalized): Secondary | ICD-10-CM | POA: Diagnosis not present

## 2014-01-28 DIAGNOSIS — S8290XD Unspecified fracture of unspecified lower leg, subsequent encounter for closed fracture with routine healing: Secondary | ICD-10-CM | POA: Diagnosis not present

## 2014-01-28 DIAGNOSIS — M6281 Muscle weakness (generalized): Secondary | ICD-10-CM | POA: Diagnosis not present

## 2014-02-01 ENCOUNTER — Ambulatory Visit: Payer: Medicare Other | Admitting: Internal Medicine

## 2014-02-03 ENCOUNTER — Ambulatory Visit (INDEPENDENT_AMBULATORY_CARE_PROVIDER_SITE_OTHER): Payer: Medicare Other | Admitting: Internal Medicine

## 2014-02-03 ENCOUNTER — Encounter: Payer: Self-pay | Admitting: Internal Medicine

## 2014-02-03 VITALS — BP 102/60 | HR 61 | Temp 98.1°F | Wt 134.0 lb

## 2014-02-03 DIAGNOSIS — E039 Hypothyroidism, unspecified: Secondary | ICD-10-CM

## 2014-02-03 DIAGNOSIS — I2510 Atherosclerotic heart disease of native coronary artery without angina pectoris: Secondary | ICD-10-CM

## 2014-02-03 DIAGNOSIS — IMO0001 Reserved for inherently not codable concepts without codable children: Secondary | ICD-10-CM | POA: Insufficient documentation

## 2014-02-03 DIAGNOSIS — E1165 Type 2 diabetes mellitus with hyperglycemia: Secondary | ICD-10-CM

## 2014-02-03 DIAGNOSIS — E1149 Type 2 diabetes mellitus with other diabetic neurological complication: Secondary | ICD-10-CM | POA: Diagnosis not present

## 2014-02-03 MED ORDER — HYDROCODONE-ACETAMINOPHEN 5-325 MG PO TABS: ORAL_TABLET | ORAL | Status: DC

## 2014-02-03 NOTE — Assessment & Plan Note (Deleted)
A1c & urine microalbumin  BMET 

## 2014-02-03 NOTE — Progress Notes (Signed)
Pre visit review using our clinic review tool, if applicable. No additional management support is needed unless otherwise documented below in the visit note. 
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
## 2014-02-03 NOTE — Assessment & Plan Note (Signed)
TSH 

## 2014-02-03 NOTE — Assessment & Plan Note (Signed)
A1c & urine microalbumin  BMET Renew narcotic

## 2014-02-03 NOTE — Patient Instructions (Signed)
Pending lab results will be transmitted to you  by mail. Please keep 02/16/14 appointment with Dr Yetta Barre

## 2014-02-09 DIAGNOSIS — S8290XD Unspecified fracture of unspecified lower leg, subsequent encounter for closed fracture with routine healing: Secondary | ICD-10-CM | POA: Diagnosis not present

## 2014-02-09 DIAGNOSIS — M6281 Muscle weakness (generalized): Secondary | ICD-10-CM | POA: Diagnosis not present

## 2014-02-15 DIAGNOSIS — M6281 Muscle weakness (generalized): Secondary | ICD-10-CM | POA: Diagnosis not present

## 2014-02-15 DIAGNOSIS — S8290XD Unspecified fracture of unspecified lower leg, subsequent encounter for closed fracture with routine healing: Secondary | ICD-10-CM | POA: Diagnosis not present

## 2014-02-16 ENCOUNTER — Ambulatory Visit: Payer: Medicare Other | Admitting: Internal Medicine

## 2014-02-23 DIAGNOSIS — Z9889 Other specified postprocedural states: Secondary | ICD-10-CM | POA: Diagnosis not present

## 2014-02-23 DIAGNOSIS — S8290XD Unspecified fracture of unspecified lower leg, subsequent encounter for closed fracture with routine healing: Secondary | ICD-10-CM | POA: Diagnosis not present

## 2014-03-10 ENCOUNTER — Ambulatory Visit (INDEPENDENT_AMBULATORY_CARE_PROVIDER_SITE_OTHER): Payer: Medicare Other | Admitting: Internal Medicine

## 2014-03-10 ENCOUNTER — Other Ambulatory Visit (INDEPENDENT_AMBULATORY_CARE_PROVIDER_SITE_OTHER): Payer: Medicare Other

## 2014-03-10 ENCOUNTER — Encounter: Payer: Self-pay | Admitting: Internal Medicine

## 2014-03-10 VITALS — BP 130/78 | HR 75 | Temp 98.4°F | Resp 16 | Ht 63.0 in | Wt 130.4 lb

## 2014-03-10 DIAGNOSIS — D539 Nutritional anemia, unspecified: Secondary | ICD-10-CM

## 2014-03-10 DIAGNOSIS — G609 Hereditary and idiopathic neuropathy, unspecified: Secondary | ICD-10-CM

## 2014-03-10 DIAGNOSIS — E039 Hypothyroidism, unspecified: Secondary | ICD-10-CM | POA: Diagnosis not present

## 2014-03-10 DIAGNOSIS — I10 Essential (primary) hypertension: Secondary | ICD-10-CM

## 2014-03-10 DIAGNOSIS — Z23 Encounter for immunization: Secondary | ICD-10-CM

## 2014-03-10 DIAGNOSIS — E559 Vitamin D deficiency, unspecified: Secondary | ICD-10-CM

## 2014-03-10 DIAGNOSIS — I2510 Atherosclerotic heart disease of native coronary artery without angina pectoris: Secondary | ICD-10-CM | POA: Diagnosis not present

## 2014-03-10 DIAGNOSIS — M810 Age-related osteoporosis without current pathological fracture: Secondary | ICD-10-CM

## 2014-03-10 DIAGNOSIS — E1149 Type 2 diabetes mellitus with other diabetic neurological complication: Secondary | ICD-10-CM | POA: Diagnosis not present

## 2014-03-10 DIAGNOSIS — E785 Hyperlipidemia, unspecified: Secondary | ICD-10-CM

## 2014-03-10 LAB — TSH: TSH: 15.92 u[IU]/mL — ABNORMAL HIGH (ref 0.35–4.50)

## 2014-03-10 LAB — CBC WITH DIFFERENTIAL/PLATELET
BASOS ABS: 0 10*3/uL (ref 0.0–0.1)
BASOS PCT: 0.3 % (ref 0.0–3.0)
EOS ABS: 0.1 10*3/uL (ref 0.0–0.7)
Eosinophils Relative: 1.6 % (ref 0.0–5.0)
HEMATOCRIT: 38.1 % (ref 36.0–46.0)
Hemoglobin: 12.3 g/dL (ref 12.0–15.0)
LYMPHS ABS: 3 10*3/uL (ref 0.7–4.0)
LYMPHS PCT: 49.6 % — AB (ref 12.0–46.0)
MCHC: 32.3 g/dL (ref 30.0–36.0)
MCV: 81.5 fl (ref 78.0–100.0)
MONO ABS: 0.3 10*3/uL (ref 0.1–1.0)
Monocytes Relative: 5.6 % (ref 3.0–12.0)
NEUTROS ABS: 2.6 10*3/uL (ref 1.4–7.7)
Neutrophils Relative %: 42.9 % — ABNORMAL LOW (ref 43.0–77.0)
Platelets: 255 10*3/uL (ref 150.0–400.0)
RBC: 4.68 Mil/uL (ref 3.87–5.11)
RDW: 16.3 % — AB (ref 11.5–15.5)
WBC: 6 10*3/uL (ref 4.0–10.5)

## 2014-03-10 LAB — IBC PANEL
Iron: 66 ug/dL (ref 42–145)
SATURATION RATIOS: 18 % — AB (ref 20.0–50.0)
Transferrin: 262.2 mg/dL (ref 212.0–360.0)

## 2014-03-10 LAB — HEMOGLOBIN A1C: Hgb A1c MFr Bld: 11.7 % — ABNORMAL HIGH (ref 4.6–6.5)

## 2014-03-10 LAB — BASIC METABOLIC PANEL
BUN: 9 mg/dL (ref 6–23)
CALCIUM: 9.3 mg/dL (ref 8.4–10.5)
CHLORIDE: 101 meq/L (ref 96–112)
CO2: 29 mEq/L (ref 19–32)
Creatinine, Ser: 0.7 mg/dL (ref 0.4–1.2)
GFR: 104.13 mL/min (ref >60.00)
GLUCOSE: 265 mg/dL — AB (ref 70–99)
Potassium: 3.5 mEq/L (ref 3.5–5.1)
Sodium: 137 mEq/L (ref 135–145)

## 2014-03-10 LAB — FERRITIN: Ferritin: 41.7 ng/mL (ref 10.0–291.0)

## 2014-03-10 MED ORDER — HYDROCODONE-ACETAMINOPHEN 5-325 MG PO TABS: ORAL_TABLET | ORAL | Status: DC

## 2014-03-10 MED ORDER — VITAMIN D (ERGOCALCIFEROL) 1.25 MG (50000 UNIT) PO CAPS: 50000.0000 [IU] | ORAL_CAPSULE | ORAL | Status: DC

## 2014-03-10 MED ORDER — PITAVASTATIN CALCIUM 2 MG PO TABS: 1.0000 | ORAL_TABLET | Freq: Every day | ORAL | Status: DC

## 2014-03-10 MED ORDER — LEVOTHYROXINE SODIUM 125 MCG PO TABS: 125.0000 ug | ORAL_TABLET | Freq: Every day | ORAL | Status: DC

## 2014-03-10 MED ORDER — EMPAGLIFLOZIN 25 MG PO TABS: 1.0000 | ORAL_TABLET | Freq: Every day | ORAL | Status: DC

## 2014-03-10 NOTE — Progress Notes (Signed)
Subjective:    Patient ID: [name] [name], female    DOB: 07/08/1934, 80 y.o.   MRN: [id number]  Diabetes She presents for her follow-up diabetic visit. She has type 2 diabetes mellitus. Her disease course has been stable. There are no hypoglycemic associated symptoms. Pertinent negatives for hypoglycemia include no dizziness, headaches or tremors. Associated symptoms include fatigue and foot paresthesias (painful burning in her feet when she sleeps). Pertinent negatives for diabetes include no blurred vision, no chest pain, no foot ulcerations, no polydipsia, no polyphagia, no polyuria, no visual change, no weakness and no weight loss. There are no hypoglycemic complications. Symptoms are stable. Diabetic complications include peripheral neuropathy. Current diabetic treatment includes oral agent (dual therapy). She is compliant with treatment most of the time. Her weight is stable. She is following a generally healthy diet. Meal planning includes avoidance of concentrated sweets. She never participates in exercise. There is no change in her home blood glucose trend. An ACE inhibitor/angiotensin II receptor blocker is being taken. She does not see a podiatrist.Eye exam is not current.      Review of Systems  Constitutional: Positive for fatigue. Negative for fever, chills, weight loss, diaphoresis, activity change, appetite change and unexpected weight change.  HENT: Negative.   Eyes: Negative.  Negative for blurred vision.  Respiratory: Negative.  Negative for cough, choking, chest tightness, shortness of breath and stridor.   Cardiovascular: Negative.  Negative for chest pain, palpitations and leg swelling.  Gastrointestinal: Negative.  Negative for nausea, vomiting, abdominal pain, diarrhea, constipation and blood in stool.  Endocrine: Negative.  Negative for polydipsia, polyphagia and polyuria.  Genitourinary: Negative.   Musculoskeletal: Negative.  Negative for arthralgias, back pain, gait  problem, joint swelling, myalgias, neck pain and neck stiffness.  Skin: Negative.  Negative for rash.  Allergic/Immunologic: Negative.   Neurological: Negative.  Negative for dizziness, tremors, syncope, weakness, light-headedness, numbness and headaches.  Hematological: Negative.  Negative for adenopathy. Does not bruise/bleed easily.  Psychiatric/Behavioral: Negative.        Objective:   Physical Exam  Vitals reviewed. Constitutional: She is oriented to person, place, and time. She appears well-developed and well-nourished. No distress.  HENT:  Head: Normocephalic and atraumatic.  Mouth/Throat: Oropharynx is clear and moist. No oropharyngeal exudate.  Eyes: Conjunctivae are normal. Right eye exhibits no discharge. Left eye exhibits no discharge. No scleral icterus.  Neck: Normal range of motion. Neck supple. No JVD present. No tracheal deviation present. No thyromegaly present.  Cardiovascular: Normal rate, regular rhythm, normal heart sounds and intact distal pulses.  Exam reveals no gallop and no friction rub.   No murmur heard. Pulmonary/Chest: Effort normal and breath sounds normal. No stridor. No respiratory distress. She has no wheezes. She has no rales. She exhibits no tenderness.  Abdominal: Soft. Bowel sounds are normal. She exhibits no distension and no mass. There is no tenderness. There is no rebound and no guarding.  Musculoskeletal: Normal range of motion. She exhibits no edema and no tenderness.  Lymphadenopathy:    She has no cervical adenopathy.  Neurological: She is oriented to person, place, and time.  Skin: Skin is warm and dry. No rash noted. She is not diaphoretic. No erythema. No pallor.  Psychiatric: She has a normal mood and affect. Her behavior is normal. Judgment and thought content normal.     Lab Results  Component Value Date   WBC 7.7 12/07/2013   HGB 9.1* 12/07/2013   HCT 27.2* 12/07/2013   PLT 158 12/07/2013  GLUCOSE 199* 12/07/2013   CHOL 131  12/06/2013   TRIG 138 12/06/2013   HDL 33* 12/06/2013   LDLDIRECT 101.1 04/08/2013   LDLCALC 70 12/06/2013   ALT 16 08/27/2012   AST 18 08/27/2012   NA 134* 12/07/2013   K 3.9 12/07/2013   CL 97 12/07/2013   CREATININE 0.88 12/07/2013   BUN 13 12/07/2013   CO2 24 12/07/2013   TSH 4.28 04/08/2013   INR 0.98 12/03/2013   HGBA1C 13.1* 12/04/2013   MICROALBUR 0.8 04/18/2011       Assessment & Plan:

## 2014-03-10 NOTE — Assessment & Plan Note (Signed)
Her blood sugars are not well controlled I will add an SGLT-2 inhibitor

## 2014-03-10 NOTE — Assessment & Plan Note (Signed)
She is due for a DEXA scan 

## 2014-03-10 NOTE — Assessment & Plan Note (Signed)
Cont norco as needed for pain 

## 2014-03-10 NOTE — Assessment & Plan Note (Signed)
My order for a Vit D level was blocked by the software Will Rx a supplement

## 2014-03-10 NOTE — Patient Instructions (Signed)

## 2014-03-10 NOTE — Assessment & Plan Note (Signed)
Her BP is well controlled 

## 2014-03-10 NOTE — Assessment & Plan Note (Signed)
TSH is elevated, I will increase her synthroid dose

## 2014-03-10 NOTE — Assessment & Plan Note (Signed)
She tells me that lipitor caused ABD pain so she stopped taking it and the ABD pain resolved I have asked her to try livalo

## 2014-03-10 NOTE — Progress Notes (Signed)
Pre visit review using our clinic review tool, if applicable. No additional management support is needed unless otherwise documented below in the visit note. 

## 2014-03-10 NOTE — Assessment & Plan Note (Signed)
My order for B12 and folate levels was blocked by the software Will recheck her CBC and her iron and methylmalonic acid level as a surrogate of the B12 level

## 2014-03-11 ENCOUNTER — Encounter (HOSPITAL_COMMUNITY): Payer: Self-pay | Admitting: Vascular Surgery

## 2014-03-11 ENCOUNTER — Telehealth: Payer: Self-pay | Admitting: Internal Medicine

## 2014-03-11 ENCOUNTER — Other Ambulatory Visit: Payer: Self-pay | Admitting: Internal Medicine

## 2014-03-11 ENCOUNTER — Telehealth: Payer: Self-pay | Admitting: *Deleted

## 2014-03-11 NOTE — Telephone Encounter (Signed)
Pt request lab result that was done and please also mail her a copy of it. Thank you

## 2014-03-11 NOTE — Telephone Encounter (Signed)
Left msg on triage stating her pharmacy called her about a new medicine that md sent in. Called pt back inform her per her records md rx Jardiance due to her  Diabetes. Her A1C was 11.7. Pt stated while she was in rehab her BS was running high, but since she been home she has gone back on her diet & exercise and taking her glipizide & metformin like she suppose too. Pt stated she is not going to take the new med md sent in want to keep doing what she is doing...[name]

## 2014-03-11 NOTE — Telephone Encounter (Signed)
Pt notified that labs are in, but that Dr. Yetta Barre has to annotate on these labs, and she will be notified with the results.

## 2014-03-12 ENCOUNTER — Other Ambulatory Visit: Payer: Self-pay | Admitting: Internal Medicine

## 2014-03-12 ENCOUNTER — Encounter: Payer: Self-pay | Admitting: Internal Medicine

## 2014-03-12 DIAGNOSIS — E1149 Type 2 diabetes mellitus with other diabetic neurological complication: Secondary | ICD-10-CM

## 2014-03-12 LAB — METHYLMALONIC ACID, SERUM: METHYLMALONIC ACID, QUANT: 108 nmol/L (ref 87–318)

## 2014-03-12 MED ORDER — INSULIN GLARGINE 100 UNIT/ML SOLOSTAR PEN: 30.0000 [IU] | PEN_INJECTOR | Freq: Every day | SUBCUTANEOUS | Status: DC

## 2014-03-13 ENCOUNTER — Other Ambulatory Visit (HOSPITAL_COMMUNITY): Payer: Self-pay | Admitting: Anesthesiology

## 2014-03-21 ENCOUNTER — Ambulatory Visit (INDEPENDENT_AMBULATORY_CARE_PROVIDER_SITE_OTHER): Payer: Medicare Other | Admitting: Podiatry

## 2014-03-21 DIAGNOSIS — B351 Tinea unguium: Secondary | ICD-10-CM

## 2014-03-21 DIAGNOSIS — M79609 Pain in unspecified limb: Secondary | ICD-10-CM

## 2014-03-21 DIAGNOSIS — M79673 Pain in unspecified foot: Secondary | ICD-10-CM

## 2014-03-21 NOTE — Progress Notes (Signed)
Subjective:     Patient ID: [name] [name], female   DOB: [birth year], 80 y.o.   MRN: [id number]  HPI patient presents with painful nailbeds 1-5 both feet that she cannot cut   Review of Systems     Objective:   Physical Exam Neurovascular status unchanged with thick brittle yellow nailbeds 1-5 both feet    Assessment:     Mycotic nail infection is with pain 1-5 both feet    Plan:     Debride painful nailbeds 1-5 both feet with no iatrogenic bleeding noted

## 2014-04-12 ENCOUNTER — Other Ambulatory Visit (HOSPITAL_COMMUNITY): Payer: Medicare Other

## 2014-04-12 ENCOUNTER — Ambulatory Visit (INDEPENDENT_AMBULATORY_CARE_PROVIDER_SITE_OTHER): Payer: Medicare Other | Admitting: *Deleted

## 2014-04-12 DIAGNOSIS — I5022 Chronic systolic (congestive) heart failure: Secondary | ICD-10-CM

## 2014-04-12 DIAGNOSIS — I428 Other cardiomyopathies: Secondary | ICD-10-CM

## 2014-04-12 DIAGNOSIS — I420 Dilated cardiomyopathy: Secondary | ICD-10-CM

## 2014-04-12 LAB — MDC_IDC_ENUM_SESS_TYPE_REMOTE
Implantable Pulse Generator Model: 3242
Lead Channel Pacing Threshold Pulse Width: 0.4 ms
Lead Channel Pacing Threshold Pulse Width: 1 ms
Lead Channel Sensing Intrinsic Amplitude: 11.2 mV
Lead Channel Sensing Intrinsic Amplitude: 2.7 mV
Lead Channel Setting Pacing Amplitude: 3.5 V
Lead Channel Setting Pacing Pulse Width: 0.4 ms
Lead Channel Setting Pacing Pulse Width: 1 ms
MDC IDC MSMT LEADCHNL LV PACING THRESHOLD AMPLITUDE: 3.25 V
MDC IDC MSMT LEADCHNL RA PACING THRESHOLD AMPLITUDE: 0.75 V
MDC IDC MSMT LEADCHNL RV PACING THRESHOLD AMPLITUDE: 1.25 V
MDC IDC MSMT LEADCHNL RV PACING THRESHOLD PULSEWIDTH: 0.4 ms
MDC IDC PG SERIAL: 7578583
MDC IDC SET LEADCHNL RA PACING AMPLITUDE: 1.625
MDC IDC SET LEADCHNL RV PACING AMPLITUDE: 2.125
MDC IDC SET LEADCHNL RV SENSING SENSITIVITY: 2 mV

## 2014-04-12 NOTE — Progress Notes (Signed)
Remote pacemaker transmission.   

## 2014-04-13 DIAGNOSIS — S8290XD Unspecified fracture of unspecified lower leg, subsequent encounter for closed fracture with routine healing: Secondary | ICD-10-CM | POA: Diagnosis not present

## 2014-04-13 DIAGNOSIS — Z9889 Other specified postprocedural states: Secondary | ICD-10-CM | POA: Diagnosis not present

## 2014-04-21 ENCOUNTER — Encounter: Payer: Self-pay | Admitting: Cardiology

## 2014-04-26 ENCOUNTER — Encounter: Payer: Self-pay | Admitting: Cardiology

## 2014-04-29 ENCOUNTER — Encounter: Payer: Self-pay | Admitting: Internal Medicine

## 2014-05-06 ENCOUNTER — Other Ambulatory Visit (HOSPITAL_COMMUNITY): Payer: Self-pay | Admitting: Cardiology

## 2014-05-06 DIAGNOSIS — I509 Heart failure, unspecified: Secondary | ICD-10-CM

## 2014-05-09 ENCOUNTER — Ambulatory Visit (HOSPITAL_COMMUNITY)
Admission: RE | Admit: 2014-05-09 | Discharge: 2014-05-09 | Disposition: A | Discharge status: Home / Self Care | Payer: Medicare Other | Source: Ambulatory Visit | Attending: Internal Medicine | Admitting: Internal Medicine

## 2014-05-09 ENCOUNTER — Ambulatory Visit (HOSPITAL_BASED_OUTPATIENT_CLINIC_OR_DEPARTMENT_OTHER)
Admission: RE | Admit: 2014-05-09 | Discharge: 2014-05-09 | Disposition: A | Discharge status: Home / Self Care | Payer: Medicare Other | Source: Ambulatory Visit | Attending: Internal Medicine | Admitting: Internal Medicine

## 2014-05-09 ENCOUNTER — Encounter (HOSPITAL_COMMUNITY): Payer: Self-pay

## 2014-05-09 VITALS — BP 112/58 | HR 67 | Wt 135.2 lb

## 2014-05-09 DIAGNOSIS — I5042 Chronic combined systolic (congestive) and diastolic (congestive) heart failure: Secondary | ICD-10-CM | POA: Diagnosis not present

## 2014-05-09 DIAGNOSIS — I5022 Chronic systolic (congestive) heart failure: Secondary | ICD-10-CM | POA: Diagnosis not present

## 2014-05-09 DIAGNOSIS — Z7982 Long term (current) use of aspirin: Secondary | ICD-10-CM | POA: Insufficient documentation

## 2014-05-09 DIAGNOSIS — Z95810 Presence of automatic (implantable) cardiac defibrillator: Secondary | ICD-10-CM | POA: Insufficient documentation

## 2014-05-09 DIAGNOSIS — E7800 Pure hypercholesterolemia, unspecified: Secondary | ICD-10-CM | POA: Diagnosis not present

## 2014-05-09 DIAGNOSIS — Z888 Allergy status to other drugs, medicaments and biological substances status: Secondary | ICD-10-CM | POA: Diagnosis not present

## 2014-05-09 DIAGNOSIS — R5381 Other malaise: Secondary | ICD-10-CM | POA: Insufficient documentation

## 2014-05-09 DIAGNOSIS — M1990 Unspecified osteoarthritis, unspecified site: Secondary | ICD-10-CM | POA: Diagnosis not present

## 2014-05-09 DIAGNOSIS — I447 Left bundle-branch block, unspecified: Secondary | ICD-10-CM | POA: Diagnosis not present

## 2014-05-09 DIAGNOSIS — E119 Type 2 diabetes mellitus without complications: Secondary | ICD-10-CM | POA: Insufficient documentation

## 2014-05-09 DIAGNOSIS — Z79899 Other long term (current) drug therapy: Secondary | ICD-10-CM | POA: Diagnosis not present

## 2014-05-09 DIAGNOSIS — E785 Hyperlipidemia, unspecified: Secondary | ICD-10-CM | POA: Insufficient documentation

## 2014-05-09 DIAGNOSIS — R5383 Other fatigue: Secondary | ICD-10-CM | POA: Diagnosis not present

## 2014-05-09 DIAGNOSIS — I428 Other cardiomyopathies: Secondary | ICD-10-CM | POA: Insufficient documentation

## 2014-05-09 DIAGNOSIS — I10 Essential (primary) hypertension: Secondary | ICD-10-CM | POA: Insufficient documentation

## 2014-05-09 DIAGNOSIS — E039 Hypothyroidism, unspecified: Secondary | ICD-10-CM | POA: Insufficient documentation

## 2014-05-09 DIAGNOSIS — I509 Heart failure, unspecified: Secondary | ICD-10-CM | POA: Diagnosis not present

## 2014-05-09 DIAGNOSIS — Z87891 Personal history of nicotine dependence: Secondary | ICD-10-CM | POA: Diagnosis not present

## 2014-05-09 DIAGNOSIS — K219 Gastro-esophageal reflux disease without esophagitis: Secondary | ICD-10-CM | POA: Diagnosis not present

## 2014-05-09 DIAGNOSIS — I059 Rheumatic mitral valve disease, unspecified: Secondary | ICD-10-CM | POA: Diagnosis not present

## 2014-05-09 DIAGNOSIS — I2510 Atherosclerotic heart disease of native coronary artery without angina pectoris: Secondary | ICD-10-CM | POA: Diagnosis not present

## 2014-05-09 MED ORDER — OMEPRAZOLE 20 MG PO CPDR: 20.0000 mg | DELAYED_RELEASE_CAPSULE | Freq: Every day | ORAL | Status: DC

## 2014-05-09 MED ORDER — LISINOPRIL 5 MG PO TABS
5.0000 mg | ORAL_TABLET | Freq: Every day | ORAL | Status: DC
Start: 2014-05-09 — End: 2014-11-28

## 2014-05-09 NOTE — Progress Notes (Signed)
Patient ID: [name] [name], female   DOB: 12/25/1933, 80 y.o.   MRN: [id number] Primary Care: Dr. Sanda Linger  Baseline proBNP: 674 on 07/30/12  HPI: [name] [name] is a 80 y.o. AAF with history of diabetes, hypothyroidism, hypertension and newly diagnosed mixed diastolic and systolic heart failure during hospitalization on 07/30/12, EF 20-25%.  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   She was evaluated by her PCP 12/13 for progressive dyspnea.  EKG showed LBBB therefore she was sent to the ER and placed on BiPap.  She had an elevated d-dimer but CTA chest neg for PE.  She was admitted for respiratory failure and found to have depressed LVEF of 20-25%.  She was diuresed and stay was c/b hypotension with acute renal failure requiring short coarse of dopamine.  Discharge Cr 0.77 (peaked 3.11).  Discharge weight 141 pounds.    Underwent Myoview 08/17/12: Overall Impression: High risk stress nuclear study. No ischemia or scar seen. Normal apical thinning is present. Severe LV systolic dysfunction. LV Ejection Fraction: 27%. LV Wall Motion: Severe global hypokinesis.   L & RHC 09/01/12 RA = 2  RV = 37/2/4  PA = 39/12 (22)  PCW = 11  Fick cardiac output/index = 3.0/1.8  Thermo CO/CI = 2.7/1.6  PVR = 4.9 Woods (Fick)  SVR = 2259  FA sat = 98%  PA sat = 57%, 59%  Ao Pressure: 135/57 (88)  LV Pressure: 136/15/20  There was no signficant gradient across the aortic valve on pullback.  Left main: Normal  LAD: Large vessel wraps apex. Gives off multiple small diagonals. 40% lesion in mid LAD. Mild plaque in apical LAD.  LCX: Nondominant. Mild to moderate non-obstructive plaque in mid vessel. Small OM-1 which is OK. Large OM-2 with tandem 90% lesions in very proximal segment. Large OM-3 with long 70-80 lesion in proximal segment.  RCA: Dominant. Proximal and mid vessel heavily calcified with diffuse 40% lesion.  LV-gram done in the RAO projection: Ejection fraction = 25-30%.  Global HK. Mild MR.  CAD treated medically as she was not having CP and LV dysfunction felt to be out of proportion to CAD.  ECHO -03/18/13  EF 20-25% with dysynchrony. Underwent implant of St Jude CRT-D 10/01/13  She returns for follow up. Feels pretty good. Doesn't really notice a difference with CRT. Main complaint is fatigue/poor energy level. Denies SOB or CP. No edema, orthopnea or PND. Goes to store and can clean up around church. Weight is down 3 lbs since last appointment.  She had ORIF tibial plateau in 4/15 without complications.  Knee is doing well.    She has been getting periodic symptoms of "indigestion."  This is an epigastric, gas-like sensation.  It is not related to eating.  It is not exertional.  Alka-Seltzer helps.  It feels like prior GERD.   Echo was done today.  EF 25-30% with diffuse hypokinesis, mild LVH, and normal RV size with mildly decreased systolic function.   Labs 04/08/13 K 4.1 Creatinine 0.9 Hemoglobin A1C 12.6 Cholesterol 187 Triglycerides 362 HDL 40  Labs 4/15 LDL 70 Labs 7/15 K 3.5, creatinine 0.7  Review of Systems: All pertinent positives and negatives as in HPI, otherwise negative.   Past Medical History  Diagnosis Date  . Hypothyroidism   . High cholesterol   . Osteoarthritis   . Chronic combined systolic and diastolic CHF, NYHA class 3     a. 03/2013 Echo: EF 25-30%, diff  HK, sev antsept HK, mild MR.  . Congestive dilated cardiomyopathy     a. 03/2013 Echo: EF 25-30%;  b. 09/2013 s/p SJM 3242 CRT-P.  Marland Kitchen Left bundle branch block   . Type II diabetes mellitus   . GERD (gastroesophageal reflux disease)   . Coronary artery disease     a. 08/2012 Cath: LM nl, LAD 18m, LCX nondom, mild-mod nonobs dzs mid, OM1 ok, OM2 90/90p, RCA 40, EF 25-30%-->Med Rx.    Current Outpatient Prescriptions  Medication Sig Dispense Refill  . aspirin EC 81 MG EC tablet Take 1 tablet (81 mg total) by mouth daily.  30 tablet  0  . carvedilol (COREG) 12.5 MG tablet TAKE 1  TABLET TWICE A DAY WITH MEALS  180 tablet  5  . furosemide (LASIX) 20 MG tablet Take 20 mg by mouth every other day.      Marland Kitchen glipiZIDE (GLUCOTROL) 10 MG tablet TAKE 1 TABLET TWICE A DAY BEFORE MEALS  180 tablet  1  . HYDROcodone-acetaminophen (NORCO/VICODIN) 5-325 MG per tablet Take 1 tablet by mouth every 4 hours as needed *DO NOT EXCEED 3 GM APAP/24 HRS*  60 tablet  0  . levothyroxine (SYNTHROID, LEVOTHROID) 125 MCG tablet Take 1 tablet (125 mcg total) by mouth daily.  90 tablet  1  . lisinopril (PRINIVIL,ZESTRIL) 5 MG tablet Take 1 tablet (5 mg total) by mouth daily.  90 tablet  3  . metFORMIN (GLUCOPHAGE-XR) 750 MG 24 hr tablet TAKE 1 TABLET DAILY      . Pitavastatin Calcium (LIVALO) 2 MG TABS Take 1 tablet (2 mg total) by mouth daily.  30 tablet  11  . Vitamin D, Ergocalciferol, (DRISDOL) 50000 UNITS CAPS capsule Take 1 capsule (50,000 Units total) by mouth every 7 (seven) days. On Monday's  12 capsule  3  . omeprazole (PRILOSEC) 20 MG capsule Take 1 capsule (20 mg total) by mouth daily.  90 capsule  3   No current facility-administered medications for this encounter.    Allergies  Allergen Reactions  . Statins   . Lipitor [Atorvastatin]     Stomach ache  . Vytorin [Ezetimibe-Simvastatin] Other (See Comments)    Leg cramps   PHYSICAL EXAM: Filed Vitals:   05/09/14 1040  BP: 112/58  Pulse: 67  Weight: 135 lb 4 oz (61.349 kg)  SpO2: 98%   General:  Elderly Well appearing. No respiratory difficulty  HEENT: normal Neck: supple. JVP 5-6 Carotids 2+ bilat; no bruits. No lymphadenopathy or thryomegaly appreciated. Cor: PMI nondisplaced. Regular rate & rhythm. No rubs,  Murmurs. ICD site looks good.  Lungs: clear Abdomen: soft, nontender, nondistended. No hepatosplenomegaly. No bruits or masses. Good bowel sounds. Extremities: no cyanosis, clubbing, rash, edema.   Neuro: alert & oriented x 3, cranial nerves grossly intact. moves all 4 extremities w/o difficulty. Affect  pleasant.  ASSESSMENT & PLAN: 1) Chronic systolic HF:  Primarily nonischemic cardiomyopathy (EF depressed out of proportion to coronary disease).  S/p St Jude CRT-D 2/15.  I reviewed today's echo, EF 25-30% (stable).  NYHA II-III. Volume status looks good.  Main complaint is fatigue.  - Continue carvedilol 12.5 mg BID.  - BP has been stable, will try to increase lisinopril to 5 mg daily.  BMET/BNP in 2 wks.  - Continue Lasix every other day.    - Reinforced need for daily weights and reviewed use of sliding scale diuretics. 2) CAD: Continue ASA and Livalo.  No exertional chest pain. 3) Hyperlipidemia: Good LDL in  4/15.  4) GERD: Patient has symptoms consistent with GERD.  I will start her on omeprazole 20 mg daily.  If symptoms do not improve, she will let me know.    Marca Ancona 05/09/2014

## 2014-05-09 NOTE — Patient Instructions (Signed)
Increase Lisinopril to 5 mg daily  Start Omeprazole 20 mg daily  Labs in 2 weeks at Dr Yetta Barre office (around 05/23/14)  Your physician recommends that you schedule a follow-up appointment in: 3 months

## 2014-05-09 NOTE — Progress Notes (Signed)
  Echocardiogram 2D Echocardiogram has been performed.  Leta Jungling M 05/09/2014, 10:29 AM

## 2014-05-25 DIAGNOSIS — M47816 Spondylosis without myelopathy or radiculopathy, lumbar region: Secondary | ICD-10-CM | POA: Diagnosis not present

## 2014-05-25 DIAGNOSIS — M545 Low back pain: Secondary | ICD-10-CM | POA: Diagnosis not present

## 2014-05-27 ENCOUNTER — Other Ambulatory Visit: Payer: Self-pay

## 2014-07-11 ENCOUNTER — Telehealth: Payer: Self-pay | Admitting: Internal Medicine

## 2014-07-11 NOTE — Telephone Encounter (Signed)
Spoke with pt and informed her that her remote transmission is automatic and she will not have to do anything to send transmission. She verbalized understanding.

## 2014-07-11 NOTE — Telephone Encounter (Signed)
New Msg   Patient calling and would like to know what she needs to do for this months pacer check. Patient states the last time it was completed she was asleep and is confused about what she should do this time. Please contact at [phone].

## 2014-07-14 ENCOUNTER — Ambulatory Visit (INDEPENDENT_AMBULATORY_CARE_PROVIDER_SITE_OTHER): Payer: Medicare Other | Admitting: *Deleted

## 2014-07-14 ENCOUNTER — Encounter: Payer: Self-pay | Admitting: Internal Medicine

## 2014-07-14 DIAGNOSIS — I420 Dilated cardiomyopathy: Secondary | ICD-10-CM

## 2014-07-14 LAB — MDC_IDC_ENUM_SESS_TYPE_REMOTE
Battery Remaining Longevity: 53 mo
Battery Remaining Percentage: 87 %
Battery Voltage: 2.98 V
Brady Statistic AP VS Percent: 1 %
Brady Statistic AS VP Percent: 80 %
Implantable Pulse Generator Model: 3242
Implantable Pulse Generator Serial Number: 7578583
Lead Channel Impedance Value: 430 Ohm
Lead Channel Impedance Value: 440 Ohm
Lead Channel Impedance Value: 830 Ohm
Lead Channel Pacing Threshold Amplitude: 0.75 V
Lead Channel Pacing Threshold Amplitude: 1.25 V
Lead Channel Pacing Threshold Amplitude: 3.375 V
Lead Channel Pacing Threshold Pulse Width: 0.4 ms
Lead Channel Pacing Threshold Pulse Width: 1 ms
Lead Channel Sensing Intrinsic Amplitude: 2.7 mV
Lead Channel Setting Pacing Amplitude: 4.375
Lead Channel Setting Pacing Pulse Width: 0.4 ms
Lead Channel Setting Pacing Pulse Width: 1 ms
MDC IDC MSMT LEADCHNL RA PACING THRESHOLD PULSEWIDTH: 0.4 ms
MDC IDC MSMT LEADCHNL RV SENSING INTR AMPL: 11.9 mV
MDC IDC SESS DTM: 20151203082605
MDC IDC SET LEADCHNL RA PACING AMPLITUDE: 1.75 V
MDC IDC SET LEADCHNL RV PACING AMPLITUDE: 2.25 V
MDC IDC SET LEADCHNL RV SENSING SENSITIVITY: 2 mV
MDC IDC STAT BRADY AP VP PERCENT: 20 %
MDC IDC STAT BRADY AS VS PERCENT: 1 %
MDC IDC STAT BRADY RA PERCENT PACED: 20 %

## 2014-07-15 NOTE — Progress Notes (Signed)
Remote pacemaker transmission.   

## 2014-07-21 ENCOUNTER — Encounter (HOSPITAL_COMMUNITY): Payer: Self-pay | Admitting: Internal Medicine

## 2014-07-29 ENCOUNTER — Encounter (HOSPITAL_COMMUNITY): Payer: Medicare Other

## 2014-10-04 ENCOUNTER — Telehealth: Payer: Self-pay | Admitting: Internal Medicine

## 2014-10-04 MED ORDER — HYDROXYZINE HCL 25 MG PO TABS
25.0000 mg | ORAL_TABLET | Freq: Three times a day (TID) | ORAL | Status: DC | PRN
Start: 2014-10-04 — End: 2014-11-09

## 2014-10-04 NOTE — Telephone Encounter (Signed)
Patient wants to know if Atarax can be called into the pharmacy [facility] on [address]) for her itching. Please call her if it's sent to the pharmacy.

## 2014-10-04 NOTE — Telephone Encounter (Signed)
Yes. This has been sent to her pharmacy

## 2014-10-05 ENCOUNTER — Encounter: Payer: Self-pay | Admitting: *Deleted

## 2014-10-05 NOTE — Telephone Encounter (Signed)
Notified pt md sent med to [facility].../lmb 

## 2014-10-20 ENCOUNTER — Ambulatory Visit: Payer: Medicare Other | Admitting: Podiatrist

## 2014-10-24 ENCOUNTER — Other Ambulatory Visit: Payer: Self-pay | Admitting: Internal Medicine

## 2014-10-27 ENCOUNTER — Ambulatory Visit (INDEPENDENT_AMBULATORY_CARE_PROVIDER_SITE_OTHER): Payer: Medicare Other | Admitting: Podiatrist

## 2014-10-27 ENCOUNTER — Encounter: Payer: Self-pay | Admitting: Podiatrist

## 2014-10-27 DIAGNOSIS — M79609 Pain in unspecified limb: Principal | ICD-10-CM

## 2014-10-27 DIAGNOSIS — E1040 Type 1 diabetes mellitus with diabetic neuropathy, unspecified: Secondary | ICD-10-CM

## 2014-10-27 DIAGNOSIS — B351 Tinea unguium: Secondary | ICD-10-CM

## 2014-10-27 NOTE — Patient Instructions (Signed)
Diabetes and Foot Care Diabetes may cause you to have problems because of poor blood supply (circulation) to your feet and legs. This may cause the skin on your feet to become thinner, break easier, and heal more slowly. Your skin may become dry, and the skin may peel and crack. You may also have nerve damage in your legs and feet causing decreased feeling in them. You may not notice minor injuries to your feet that could lead to infections or more serious problems. Taking care of your feet is one of the most important things you can do for yourself.  HOME CARE INSTRUCTIONS  Wear shoes at all times, even in the house. Do not go barefoot. Bare feet are easily injured.  Check your feet daily for blisters, cuts, and redness. If you cannot see the bottom of your feet, use a mirror or ask someone for help.  Wash your feet with warm water (do not use hot water) and mild soap. Then pat your feet and the areas between your toes until they are completely dry. Do not soak your feet as this can dry your skin.  Apply a moisturizing lotion or petroleum jelly (that does not contain alcohol and is unscented) to the skin on your feet and to dry, brittle toenails. Do not apply lotion between your toes.  Trim your toenails straight across. Do not dig under them or around the cuticle. File the edges of your nails with an emery board or nail file.  Do not cut corns or calluses or try to remove them with medicine.  Wear clean socks or stockings every day. Make sure they are not too tight. Do not wear knee-high stockings since they may decrease blood flow to your legs.  Wear shoes that fit properly and have enough cushioning. To break in new shoes, wear them for just a few hours a day. This prevents you from injuring your feet. Always look in your shoes before you put them on to be sure there are no objects inside.  Do not cross your legs. This may decrease the blood flow to your feet.  If you find a minor scrape,  cut, or break in the skin on your feet, keep it and the skin around it clean and dry. These areas may be cleansed with mild soap and water. Do not cleanse the area with peroxide, alcohol, or iodine.  When you remove an adhesive bandage, be sure not to damage the skin around it.  If you have a wound, look at it several times a day to make sure it is healing.  Do not use heating pads or hot water bottles. They may burn your skin. If you have lost feeling in your feet or legs, you may not know it is happening until it is too late.  Make sure your health care provider performs a complete foot exam at least annually or more often if you have foot problems. Report any cuts, sores, or bruises to your health care provider immediately. SEEK MEDICAL CARE IF:   You have an injury that is not healing.  You have cuts or breaks in the skin.  You have an ingrown nail.  You notice redness on your legs or feet.  You feel burning or tingling in your legs or feet.  You have pain or cramps in your legs and feet.  Your legs or feet are numb.  Your feet always feel cold. SEEK IMMEDIATE MEDICAL CARE IF:   There is increasing redness,   swelling, or pain in or around a wound.  There is a red line that goes up your leg.  Pus is coming from a wound.  You develop a fever or as directed by your health care provider.  You notice a bad smell coming from an ulcer or wound. Document Released: 07/26/2000 Document Revised: 03/31/2013 Document Reviewed: 01/05/2013 Exit[employer] Patient Information 2015 [employer]. This information is not intended to replace advice given to you by your health care provider. Make sure you discuss any questions you have with your health care provider.  

## 2014-10-27 NOTE — Progress Notes (Signed)
HPI: Patient presents today for follow up of diabetic foot and nail care. Past medical history, meds, and allergies reviewed. Patient states blood sugar is under fair  control.   Objective:   Objective:  Patients chart is reviewed.  Vascular status reveals pedal pulses noted at  2 out of 4 dp and pt bilateral .  Neurological sensation is decreased to [facility] monofilament bilateral at 3/5 sites bilateral.  Dermatological exam reveals  absence of pre ulcerative/ hyperkeratotic lesions.   Toenails are elongated, incurvated, discolored, dystrophic with ingrown deformity present.    Assessment: Diabetes with Neuropathy, symptomatic toenails.  Plan: Discussed treatment options and alternatives. Debrided nails without complication. She would prefer to call for her next appointment

## 2014-11-09 ENCOUNTER — Ambulatory Visit (INDEPENDENT_AMBULATORY_CARE_PROVIDER_SITE_OTHER): Payer: Medicare Other | Admitting: Internal Medicine

## 2014-11-09 ENCOUNTER — Encounter: Payer: Self-pay | Admitting: Internal Medicine

## 2014-11-09 ENCOUNTER — Other Ambulatory Visit (INDEPENDENT_AMBULATORY_CARE_PROVIDER_SITE_OTHER): Payer: Medicare Other

## 2014-11-09 VITALS — BP 142/76 | HR 74 | Temp 98.5°F | Resp 12 | Ht 64.0 in | Wt 138.8 lb

## 2014-11-09 DIAGNOSIS — E1140 Type 2 diabetes mellitus with diabetic neuropathy, unspecified: Secondary | ICD-10-CM | POA: Diagnosis not present

## 2014-11-09 DIAGNOSIS — E1149 Type 2 diabetes mellitus with other diabetic neurological complication: Secondary | ICD-10-CM

## 2014-11-09 DIAGNOSIS — I10 Essential (primary) hypertension: Secondary | ICD-10-CM

## 2014-11-09 DIAGNOSIS — E038 Other specified hypothyroidism: Secondary | ICD-10-CM

## 2014-11-09 DIAGNOSIS — G609 Hereditary and idiopathic neuropathy, unspecified: Secondary | ICD-10-CM

## 2014-11-09 LAB — URINALYSIS, ROUTINE W REFLEX MICROSCOPIC
BILIRUBIN URINE: NEGATIVE
Ketones, ur: NEGATIVE
NITRITE: NEGATIVE
PH: 6 (ref 5.0–8.0)
RBC / HPF: NONE SEEN (ref 0–?)
Specific Gravity, Urine: 1.02 (ref 1.000–1.030)
Total Protein, Urine: NEGATIVE
Urine Glucose: NEGATIVE
Urobilinogen, UA: 0.2 (ref 0.0–1.0)

## 2014-11-09 LAB — CBC WITH DIFFERENTIAL/PLATELET
BASOS ABS: 0 10*3/uL (ref 0.0–0.1)
Basophils Relative: 0.6 % (ref 0.0–3.0)
Eosinophils Absolute: 0.1 10*3/uL (ref 0.0–0.7)
Eosinophils Relative: 1.4 % (ref 0.0–5.0)
HCT: 41.9 % (ref 36.0–46.0)
Hemoglobin: 13.9 g/dL (ref 12.0–15.0)
Lymphs Abs: 4.5 10*3/uL — ABNORMAL HIGH (ref 0.7–4.0)
MCHC: 33 g/dL (ref 30.0–36.0)
MCV: 81.5 fl (ref 78.0–100.0)
MONO ABS: 0.4 10*3/uL (ref 0.1–1.0)
Monocytes Relative: 5.8 % (ref 3.0–12.0)
NEUTROS PCT: 28 % — AB (ref 43.0–77.0)
Neutro Abs: 1.9 10*3/uL (ref 1.4–7.7)
Platelets: 241 10*3/uL (ref 150.0–400.0)
RBC: 5.14 Mil/uL — ABNORMAL HIGH (ref 3.87–5.11)
RDW: 15.6 % — AB (ref 11.5–15.5)
WBC: 6.9 10*3/uL (ref 4.0–10.5)

## 2014-11-09 LAB — GLUCOSE, POCT (MANUAL RESULT ENTRY): POC Glucose: 140 mg/dl — AB (ref 70–99)

## 2014-11-09 LAB — LIPID PANEL
Cholesterol: 240 mg/dL — ABNORMAL HIGH (ref 0–200)
HDL: 38.9 mg/dL — ABNORMAL LOW (ref >39.00)
NONHDL: 201.1
TRIGLYCERIDES: 251 mg/dL — AB (ref 0.0–149.0)
Total CHOL/HDL Ratio: 6
VLDL: 50.2 mg/dL — AB (ref 0.0–40.0)

## 2014-11-09 LAB — BASIC METABOLIC PANEL
BUN: 12 mg/dL (ref 6–23)
CALCIUM: 9.7 mg/dL (ref 8.4–10.5)
CO2: 30 mEq/L (ref 19–32)
Chloride: 103 mEq/L (ref 96–112)
Creatinine, Ser: 0.79 mg/dL (ref 0.40–1.20)
GFR: 90.41 mL/min (ref >60.00)
Glucose, Bld: 139 mg/dL — ABNORMAL HIGH (ref 70–99)
Potassium: 3.8 mEq/L (ref 3.5–5.1)
Sodium: 137 mEq/L (ref 135–145)

## 2014-11-09 LAB — LDL CHOLESTEROL, DIRECT: LDL DIRECT: 140 mg/dL

## 2014-11-09 LAB — TSH: TSH: 32.86 u[IU]/mL — AB (ref 0.35–4.50)

## 2014-11-09 LAB — HEMOGLOBIN A1C: Hgb A1c MFr Bld: 13.7 % — ABNORMAL HIGH (ref 4.6–6.5)

## 2014-11-09 MED ORDER — HYDROXYZINE HCL 25 MG PO TABS: 25.0000 mg | ORAL_TABLET | Freq: Three times a day (TID) | ORAL | Status: DC | PRN

## 2014-11-09 MED ORDER — INSULIN PEN NEEDLE 32G X 6 MM MISC: 1.0000 | Freq: Every day | Status: DC

## 2014-11-09 MED ORDER — GLIPIZIDE 10 MG PO TABS: ORAL_TABLET | ORAL | Status: DC

## 2014-11-09 MED ORDER — HYDROCODONE-ACETAMINOPHEN 5-325 MG PO TABS: ORAL_TABLET | ORAL | Status: DC

## 2014-11-09 MED ORDER — METFORMIN HCL ER 750 MG PO TB24: 750.0000 mg | ORAL_TABLET | Freq: Every day | ORAL | Status: DC

## 2014-11-09 MED ORDER — INSULIN GLARGINE 300 UNIT/ML ~~LOC~~ SOPN: 30.0000 [IU] | PEN_INJECTOR | Freq: Every day | SUBCUTANEOUS | Status: DC

## 2014-11-09 NOTE — Assessment & Plan Note (Signed)
Her BP is well controlled Will monitor her lytes and renal function 

## 2014-11-09 NOTE — Assessment & Plan Note (Signed)
She tells me that all of her blood sugars at home are over 200 and she has some poly's Will cont the SU and metformin but will add a basal insulin She was referred for an eye exam

## 2014-11-09 NOTE — Progress Notes (Signed)
Pre visit review using our clinic review tool, if applicable. No additional management support is needed unless otherwise documented below in the visit note. 

## 2014-11-09 NOTE — Progress Notes (Signed)
Subjective:    Patient ID: [name] [name], female    DOB: November 28, 1933, 81 y.o.   MRN: [id number]  Diabetes She presents for her follow-up diabetic visit. She has type 2 diabetes mellitus. Her disease course has been worsening. There are no hypoglycemic associated symptoms. Pertinent negatives for hypoglycemia include no dizziness or speech difficulty. Associated symptoms include polydipsia and polyphagia. Pertinent negatives for diabetes include no blurred vision, no chest pain, no fatigue, no foot paresthesias, no foot ulcerations, no polyuria, no visual change, no weakness and no weight loss. There are no hypoglycemic complications. Symptoms are stable. Diabetic complications include heart disease and peripheral neuropathy. Pertinent negatives for diabetic complications include no autonomic neuropathy, CVA, impotence, nephropathy, PVD or retinopathy. She is compliant with treatment all of the time. Her weight is stable. She is following a generally unhealthy diet. When asked about meal planning, she reported none. She participates in exercise intermittently. Her breakfast blood glucose range is generally >200 mg/dl. Her lunch blood glucose range is generally >200 mg/dl. Her dinner blood glucose range is generally >200 mg/dl. Her highest blood glucose is >200 mg/dl. Her overall blood glucose range is >200 mg/dl. An ACE inhibitor/angiotensin II receptor blocker is being taken. She does not see a podiatrist.Eye exam is not current.      Review of Systems  Constitutional: Negative.  Negative for chills, weight loss, diaphoresis, appetite change and fatigue.  HENT: Negative.   Eyes: Negative.  Negative for blurred vision.  Respiratory: Negative.  Negative for cough, choking, chest tightness, shortness of breath and stridor.   Cardiovascular: Negative.  Negative for chest pain, palpitations and leg swelling.  Gastrointestinal: Negative.  Negative for nausea, vomiting, abdominal pain, diarrhea,  constipation and blood in stool.  Endocrine: Positive for polydipsia and polyphagia. Negative for polyuria.  Genitourinary: Negative.  Negative for impotence.  Musculoskeletal: Negative.  Negative for myalgias, back pain, arthralgias and neck pain.       Burning pain in both feet  Skin: Negative.   Allergic/Immunologic: Negative.   Neurological: Negative.  Negative for dizziness, syncope, speech difficulty, weakness and light-headedness.  Hematological: Negative.  Negative for adenopathy. Does not bruise/bleed easily.  Psychiatric/Behavioral: Negative.        Objective:   Physical Exam  Constitutional: She is oriented to person, place, and time. She appears well-developed and well-nourished. No distress.  HENT:  Head: Normocephalic and atraumatic.  Mouth/Throat: Oropharynx is clear and moist. No oropharyngeal exudate.  Eyes: Conjunctivae are normal. Right eye exhibits no discharge. Left eye exhibits no discharge. No scleral icterus.  Neck: Normal range of motion. Neck supple. No JVD present. No tracheal deviation present. No thyromegaly present.  Cardiovascular: Normal rate, regular rhythm, S1 normal, S2 normal and intact distal pulses.  Exam reveals gallop and S3. Exam reveals no S4 and no friction rub.   No murmur heard.  No systolic murmur is present   No diastolic murmur is present  Pulmonary/Chest: Effort normal and breath sounds normal. No stridor. No respiratory distress. She has no wheezes. She has no rales. She exhibits no tenderness.  Abdominal: Soft. Bowel sounds are normal. She exhibits no distension and no mass. There is no tenderness. There is no rebound and no guarding.  Musculoskeletal: Normal range of motion. She exhibits no edema or tenderness.  Lymphadenopathy:    She has no cervical adenopathy.  Neurological: She is oriented to person, place, and time.  Skin: Skin is warm. No rash noted. She is not diaphoretic. No erythema. No pallor.  Vitals reviewed.    Lab  Results  Component Value Date   WBC 6.0 03/10/2014   HGB 12.3 03/10/2014   HCT 38.1 03/10/2014   PLT 255.0 03/10/2014   GLUCOSE 265* 03/10/2014   CHOL 131 12/06/2013   TRIG 138 12/06/2013   HDL 33* 12/06/2013   LDLDIRECT 101.1 04/08/2013   LDLCALC 70 12/06/2013   ALT 16 08/27/2012   AST 18 08/27/2012   NA 137 03/10/2014   K 3.5 03/10/2014   CL 101 03/10/2014   CREATININE 0.7 03/10/2014   BUN 9 03/10/2014   CO2 29 03/10/2014   TSH 15.92* 03/10/2014   INR 0.98 12/03/2013   HGBA1C 11.7* 03/10/2014   MICROALBUR 0.8 04/18/2011       Assessment & Plan:

## 2014-11-09 NOTE — Assessment & Plan Note (Signed)
Will recheck her TSH and will adjust her dose if needed

## 2014-11-09 NOTE — Assessment & Plan Note (Signed)
Will cont norco as needed for pain 

## 2014-11-10 ENCOUNTER — Encounter: Payer: Self-pay | Admitting: Internal Medicine

## 2014-11-10 LAB — MICROALBUMIN / CREATININE URINE RATIO
Creatinine,U: 87.9 mg/dL
MICROALB UR: 7.4 mg/dL — AB (ref 0.0–1.9)
Microalb Creat Ratio: 8.4 mg/g (ref 0.0–30.0)

## 2014-11-10 MED ORDER — LEVOTHYROXINE SODIUM 125 MCG PO TABS: 125.0000 ug | ORAL_TABLET | Freq: Every day | ORAL | Status: DC

## 2014-11-10 NOTE — Addendum Note (Signed)
Addended by: [name] on: 11/10/2014 12:32 PM   Modules accepted: Orders

## 2014-11-28 ENCOUNTER — Other Ambulatory Visit (HOSPITAL_COMMUNITY): Payer: Self-pay | Admitting: *Deleted

## 2014-11-28 MED ORDER — LISINOPRIL 5 MG PO TABS: 5.0000 mg | ORAL_TABLET | Freq: Every day | ORAL | Status: DC

## 2014-12-01 ENCOUNTER — Encounter: Payer: Self-pay | Admitting: Internal Medicine

## 2014-12-01 ENCOUNTER — Ambulatory Visit (INDEPENDENT_AMBULATORY_CARE_PROVIDER_SITE_OTHER): Payer: Medicare Other | Admitting: Internal Medicine

## 2014-12-01 VITALS — BP 152/80 | HR 73 | Ht 65.0 in | Wt 137.6 lb

## 2014-12-01 DIAGNOSIS — Z45018 Encounter for adjustment and management of other part of cardiac pacemaker: Secondary | ICD-10-CM

## 2014-12-01 DIAGNOSIS — I5022 Chronic systolic (congestive) heart failure: Secondary | ICD-10-CM | POA: Diagnosis not present

## 2014-12-01 DIAGNOSIS — I447 Left bundle-branch block, unspecified: Secondary | ICD-10-CM | POA: Diagnosis not present

## 2014-12-01 LAB — MDC_IDC_ENUM_SESS_TYPE_INCLINIC
Battery Remaining Longevity: 61.2 mo
Date Time Interrogation Session: 20160421132507
Implantable Pulse Generator Model: 3242
Lead Channel Impedance Value: 450 Ohm
Lead Channel Impedance Value: 950 Ohm
Lead Channel Pacing Threshold Amplitude: 1 V
Lead Channel Pacing Threshold Amplitude: 1.25 V
Lead Channel Pacing Threshold Pulse Width: 0.4 ms
Lead Channel Pacing Threshold Pulse Width: 1 ms
Lead Channel Sensing Intrinsic Amplitude: 3.1 mV
Lead Channel Setting Pacing Amplitude: 1.75 V
Lead Channel Setting Pacing Amplitude: 2.25 V
Lead Channel Setting Pacing Pulse Width: 0.4 ms
Lead Channel Setting Sensing Sensitivity: 2 mV
MDC IDC MSMT BATTERY VOLTAGE: 2.98 V
MDC IDC MSMT LEADCHNL RA PACING THRESHOLD AMPLITUDE: 0.75 V
MDC IDC MSMT LEADCHNL RV IMPEDANCE VALUE: 475 Ohm
MDC IDC MSMT LEADCHNL RV PACING THRESHOLD PULSEWIDTH: 0.4 ms
MDC IDC MSMT LEADCHNL RV SENSING INTR AMPL: 12 mV
MDC IDC PG SERIAL: 7578583
MDC IDC SET LEADCHNL LV PACING PULSEWIDTH: 1 ms
MDC IDC STAT BRADY RA PERCENT PACED: 20 %
MDC IDC STAT BRADY RV PERCENT PACED: 99.75 %

## 2014-12-01 MED ORDER — LISINOPRIL 5 MG PO TABS: 5.0000 mg | ORAL_TABLET | Freq: Every day | ORAL | Status: DC

## 2014-12-01 NOTE — Patient Instructions (Signed)
Medication Instructions:  Your physician recommends that you continue on your current medications as directed. Please refer to the Current Medication list given to you today.  Labwork: None  Testing/Procedures: None  Follow-Up: Remote monitoring is used to monitor your Pacemaker of ICD from home. This monitoring reduces the number of office visits required to check your device to one time per year. It allows [place] to keep an eye on the functioning of your device to ensure it is working properly. You are scheduled for a device check from home on 03/02/15. You may send your transmission at any time that day. If you have a wireless device, the transmission will be sent automatically. After your physician reviews your transmission, you will receive a postcard with your next transmission date.  Your physician wants you to follow-up in: 1 year with Dr. Graciela Husbands.  You will receive a reminder letter in the mail two months in advance. If you don't receive a letter, please call our office to schedule the follow-up appointment.   Any Other Special Instructions Will Be Listed Below (If Applicable).  Thank you for choosing [facility]!!

## 2014-12-01 NOTE — Progress Notes (Signed)
Patient Care Team: [name], MD as PCP - General   HPI  [name] [name] is a 81 y.o. female Seen in followup for congestive heart failure status post recent CRT implantation (2/15) she has nonischemic cardiomyopathy.  There had been interval significant improvement in congestive symptoms following CRT implantation  while she is having less dyspnea she continues with fatigue with exertion i.e. climbing stairs;    She s subsequently tripped and broke her tibial plateau required surgical repair and rehabilitation. She is still not weightbearing.    Past Medical History  Diagnosis Date  . Hypothyroidism   . High cholesterol   . Osteoarthritis   . Chronic combined systolic and diastolic CHF, NYHA class 3     a. 03/2013 Echo: EF 25-30%, diff HK, sev antsept HK, mild MR.  . Congestive dilated cardiomyopathy     a. 03/2013 Echo: EF 25-30%;  b. 09/2013 s/p SJM 3242 CRT-P.  Marland Kitchen Left bundle branch block   . Type II diabetes mellitus   . GERD (gastroesophageal reflux disease)   . Coronary artery disease     a. 08/2012 Cath: LM nl, LAD 81m, LCX nondom, mild-mod nonobs dzs mid, OM1 ok, OM2 90/90p, RCA 40, EF 25-30%-->Med Rx.    Past Surgical History  Procedure Laterality Date  . Abdominal hysterectomy  1980  . Bmd  2006  . Bilateral cateract surgery    . Bi-ventricular pacemaker insertion (crt-p)      a. 09/2013 s/p SJM 3242 CRT-P.  . Orif tibia plateau Right 12/04/2013    Procedure: OPEN REDUCTION INTERNAL FIXATION (ORIF) TIBIAL PLATEAU;  Surgeon: Verlee Rossetti, MD;  Location: [facility] ORS;  Service: Orthopedics;  Laterality: Right;  . Right heart catheterization N/A 09/08/2012    Procedure: RIGHT HEART CATH;  Surgeon: Dolores Patty, MD;  Location: [facility] CATH LAB;  Service: Cardiovascular;  Laterality: N/A;  . Bi-ventricular pacemaker insertion N/A 10/01/2013    Procedure: BI-VENTRICULAR PACEMAKER INSERTION (CRT-P);  Surgeon: Duke Salvia, MD;  Location: [facility] CATH LAB;  Service:  Cardiovascular;  Laterality: N/A;    Current Outpatient Prescriptions  Medication Sig Dispense Refill  . aspirin EC 81 MG EC tablet Take 1 tablet (81 mg total) by mouth daily. 30 tablet 0  . carvedilol (COREG) 12.5 MG tablet TAKE 1 TABLET TWICE A DAY WITH MEALS 180 tablet 5  . furosemide (LASIX) 20 MG tablet Take 20 mg by mouth every other day.    Marland Kitchen glipiZIDE (GLUCOTROL) 10 MG tablet TAKE 1 TABLET TWICE A DAY BEFORE MEALS 180 tablet 3  . HYDROcodone-acetaminophen (NORCO/VICODIN) 5-325 MG per tablet Take 1 tablet by mouth every 4 hours as needed *DO NOT EXCEED 3 GM APAP/24 HRS* 60 tablet 0  . hydrOXYzine (ATARAX/VISTARIL) 25 MG tablet Take 1 tablet (25 mg total) by mouth 3 (three) times daily as needed for itching. 270 tablet 3  . Insulin Glargine (TOUJEO SOLOSTAR) 300 UNIT/ML SOPN Inject 30 Units into the skin daily. 1.5 mL 11  . Insulin Pen Needle (NOVOFINE) 32G X 6 MM MISC 1 Act by Does not apply route daily. 100 each 3  . levothyroxine (SYNTHROID, LEVOTHROID) 125 MCG tablet Take 1 tablet (125 mcg total) by mouth daily. 90 tablet 1  . lisinopril (PRINIVIL,ZESTRIL) 5 MG tablet Take 1 tablet (5 mg total) by mouth daily. 90 tablet 3  . metFORMIN (GLUCOPHAGE-XR) 750 MG 24 hr tablet Take 1 tablet (750 mg total) by mouth daily. 90 tablet 3  .  omeprazole (PRILOSEC) 20 MG capsule Take 1 capsule (20 mg total) by mouth daily. 90 capsule 3  . Pitavastatin Calcium (LIVALO) 2 MG TABS Take 1 tablet (2 mg total) by mouth daily. 30 tablet 11  . Vitamin D, Ergocalciferol, (DRISDOL) 50000 UNITS CAPS capsule Take 1 capsule (50,000 Units total) by mouth every 7 (seven) days. On Monday's 12 capsule 3   No current facility-administered medications for this visit.    Allergies  Allergen Reactions  . Statins   . Lipitor [Atorvastatin]     Stomach ache  . Vytorin [Ezetimibe-Simvastatin] Other (See Comments)    Leg cramps    Review of Systems negative except from HPI and PMH  Physical Exam BP 152/80 mmHg   Pulse 73  Ht 5\' 5"  (1.651 m)  Wt 137 lb 9.6 oz (62.415 kg)  BMI 22.90 kg/m2 Well developed and well nourished in no acute distress   HENT normal E scleral and icterus clear Neck Supple JVP flat; carotids brisk and full Clear to ausculation Device pocket well healed; without hematoma or erythema.  There is no tetheringRegular rate and rhythm, no murmurs gallops or rub Soft with active bowel sounds No clubbing cyanosis   she is sitting in a wheelchair  no Edema Alert and oriented, grossly normal motor and sensory function Skin Warm and Dry  Chest x-ray from 2/15 was reviewed. The lead is relatively basilar. It is noted that she paces from 3 and 4 off of her lead.  ECG demonstrates P synchronous pacing with a QRS duration of 135 down from the ECG that was preoperative with a QRS duration of 158     Assessment and  Plan'  Nonischemic cardiomyopathy  Congestive heart failure-chronic systolic   CRT P.-[employer] Jude  The patient's device was interrogated.  The information was reviewed.    She  is euvolemic. Will continue current medications but She is still quite symptomatic wth significant exercise intolerance.  We will reprogram the device as she was struggling with fusion and thus under pacing   We have reprogrammed SAV and LV vector   She may need repeat CXR for lead positioning  Her blood pressure is elevated today but she did not take her medications.  We will look at trying to reprogram her device to use a more distal pole as well as to shortness of the AV delay to see if we can't be sure LV capture as opposed effusion.  We spent more than 50% of our >40 min visit in face to face counseling regarding the above

## 2014-12-06 ENCOUNTER — Encounter: Payer: Self-pay | Admitting: Internal Medicine

## 2014-12-06 ENCOUNTER — Ambulatory Visit (INDEPENDENT_AMBULATORY_CARE_PROVIDER_SITE_OTHER): Payer: Medicare Other | Admitting: Internal Medicine

## 2014-12-06 VITALS — BP 118/62 | HR 64 | Temp 98.4°F | Resp 12 | Ht 63.0 in | Wt 137.2 lb

## 2014-12-06 DIAGNOSIS — E038 Other specified hypothyroidism: Secondary | ICD-10-CM | POA: Diagnosis not present

## 2014-12-06 DIAGNOSIS — E1149 Type 2 diabetes mellitus with other diabetic neurological complication: Secondary | ICD-10-CM

## 2014-12-06 MED ORDER — METFORMIN HCL ER 750 MG PO TB24: 750.0000 mg | ORAL_TABLET | Freq: Every day | ORAL | Status: DC

## 2014-12-06 NOTE — Patient Instructions (Signed)
Please continue: - Glipizide 10 mg 2x a day  Increase: - Metformin ER to 750 mg 2x a day   Please return in 5 weeks with your sugar log.   PATIENT INSTRUCTIONS FOR TYPE 2 DIABETES:  **Please join MyChart!** - see attached instructions about how to join if you have not done so already.  DIET AND EXERCISE Diet and exercise is an important part of diabetic treatment.  We recommended aerobic exercise in the form of brisk walking (working between 40-60% of maximal aerobic capacity, similar to brisk walking) for 150 minutes per week (such as 30 minutes five days per week) along with 3 times per week performing 'resistance' training (using various gauge rubber tubes with handles) 5-10 exercises involving the major muscle groups (upper body, lower body and core) performing 10-15 repetitions (or near fatigue) each exercise. Start at half the above goal but build slowly to reach the above goals. If limited by weight, joint pain, or disability, we recommend daily walking in a swimming pool with water up to waist to reduce pressure from joints while allow for adequate exercise.    BLOOD GLUCOSES Monitoring your blood glucoses is important for continued management of your diabetes. Please check your blood glucoses 2-4 times a day: fasting, before meals and at bedtime (you can rotate these measurements - e.g. one day check before the 3 meals, the next day check before 2 of the meals and before bedtime, etc.).   HYPOGLYCEMIA (low blood sugar) Hypoglycemia is usually a reaction to not eating, exercising, or taking too much insulin/ other diabetes drugs.  Symptoms include tremors, sweating, hunger, confusion, headache, etc. Treat IMMEDIATELY with 15 grams of Carbs: . 4 glucose tablets .  cup regular juice/soda . 2 tablespoons raisins . 4 teaspoons sugar . 1 tablespoon honey Recheck blood glucose in 15 mins and repeat above if still symptomatic/blood glucose <100.  RECOMMENDATIONS TO REDUCE YOUR RISK OF  DIABETIC COMPLICATIONS: * Take your prescribed MEDICATION(S) * Follow a DIABETIC diet: Complex carbs, fiber rich foods, (monounsaturated and polyunsaturated) fats * AVOID saturated/trans fats, high fat foods, >2,300 mg salt per day. * EXERCISE at least 5 times a week for 30 minutes or preferably daily.  * DO NOT SMOKE OR DRINK more than 1 drink a day. * Check your FEET every day. Do not wear tightfitting shoes. Contact us if you develop an ulcer * See your EYE doctor once a year or more if needed * Get a FLU shot once a year * Get a PNEUMONIA vaccine once before and once after age 44 years  GOALS:  * Your Hemoglobin A1c of <7%  * fasting sugars need to be <130 * after meals sugars need to be <180 (2h after you start eating) * Your Systolic BP should be 140 or lower  * Your Diastolic BP should be 80 or lower  * Your HDL (Good Cholesterol) should be 40 or higher  * Your LDL (Bad Cholesterol) should be 100 or lower. * Your Triglycerides should be 150 or lower  * Your Urine microalbumin (kidney function) should be <30 * Your Body Mass Index should be 25 or lower    Please consider the following ways to cut down carbs and fat and increase fiber and micronutrients in your diet: - substitute whole grain for white bread or pasta - substitute brown rice for white rice - substitute 90-calorie flat bread pieces for slices of bread when possible - substitute sweet potatoes or yams for white potatoes -  substitute humus for margarine - substitute tofu for cheese when possible - substitute almond or rice milk for regular milk (would not drink soy milk daily due to concern for soy estrogen influence on breast cancer risk) - substitute dark chocolate for other sweets when possible - substitute water - can add lemon or orange slices for taste - for diet sodas (artificial sweeteners will trick your body that you can eat sweets without getting calories and will lead you to overeating and weight gain in  the long run) - do not skip breakfast or other meals (this will slow down the metabolism and will result in more weight gain over time)  - can try smoothies made from fruit and almond/rice milk in am instead of regular breakfast - can also try old-fashioned (not instant) oatmeal made with almond/rice milk in am - order the dressing on the side when eating salad at a restaurant (pour less than half of the dressing on the salad) - eat as little meat as possible - can try juicing, but should not forget that juicing will get rid of the fiber, so would alternate with eating raw veg./fruits or drinking smoothies - use as little oil as possible, even when using olive oil - can dress a salad with a mix of balsamic vinegar and lemon juice, for e.g. - use agave nectar, stevia sugar, or regular sugar rather than artificial sweateners - steam or broil/roast veggies  - snack on veggies/fruit/nuts (unsalted, preferably) when possible, rather than processed foods - reduce or eliminate aspartame in diet (it is in diet sodas, chewing gum, etc) Read the labels!  Try to read Dr. Janene Harvey book: "Program for Reversing Diabetes" for other ideas for healthy eating.

## 2014-12-06 NOTE — Progress Notes (Signed)
Patient ID: [name] [name], female   DOB: [birth year], 81 y.o.   MRN: [id number]  HPI: [name] [name] is a 81 y.o.-year-old female, referred by her PCP, Dr. Yetta Barre, for management of DM2, dx in ~2006, insulin-dependent, uncontrolled, with complications (peripheral neuropathy, CAD).  DM2: Last hemoglobin A1c was: Lab Results  Component Value Date   HGBA1C 13.7* 11/09/2014   HGBA1C 11.7* 03/10/2014   HGBA1C 13.1* 12/04/2013   Pt is on a regimen of: - Metformin ER 750 mg 1x a day with dinner - Glipizide 10 mg 2x a day Toujeo - added at 30 units at bedtime - added 11/09/2014, but did not start!  Pt checks her sugars 1x a week and they are: - am: 230-260 - 2h after b'fast: n/c - before lunch: n/c - 2h after lunch: n/c - before dinner: n/c - 2h after dinner: n/c - bedtime: n/c - nighttime: n/c No lows. Lowest sugar was 140. ? If she had hypoglycemia awareness.  Highest sugar was 300s.  Glucometer: AccuChek  Pt's meals are: - Breakfast: cold or hot cereal - Lunch: small sandwich - Dinner: vegetables + meat + bread - Snacks: 1/2 banana  - no CKD, last BUN/creatinine:  Lab Results  Component Value Date   BUN 12 11/09/2014   CREATININE 0.79 11/09/2014   - last set of lipids: Lab Results  Component Value Date   CHOL 240* 11/09/2014   HDL 38.90* 11/09/2014   LDLCALC 70 12/06/2013   LDLDIRECT 140.0 11/09/2014   TRIG 251.0* 11/09/2014   CHOLHDL 6 11/09/2014   - last eye exam was in 2015. No DR. + cataract OU >> implants. - + numbness and tingling in her feet. Has neuropathy. She is on Vicodin prn. This helps. Takes is rarely.  Pt has FH of DM in mother.  She also has hypothyroidism: I noticed that her latest TSH was very high:  Lab Results  Component Value Date   TSH 32.86* 11/09/2014   TSH 15.92* 03/10/2014   TSH 4.28 04/08/2013   TSH 5.75* 08/27/2012   TSH 3.64 07/30/2012   TSH 3.88 04/01/2012   Upon questioning, she is taking LT4 125 mcg: - not every day -  sometimes not fasting.  - on PPI prn sometimes in am - no calcium - no iron - on MVI usually in am  ROS: Constitutional: no weight gain/loss, + fatigue, no subjective hyperthermia/hypothermia Eyes: no blurry vision, no xerophthalmia ENT: no sore throat, no nodules palpated in throat, no dysphagia/odynophagia, no hoarseness Cardiovascular: no CP/SOB/palpitations/leg swelling Respiratory: no cough/SOB Gastrointestinal: no N/V/D/C/+ occasional heartburn Musculoskeletal: no muscle/+ joint aches Skin: no rashes, + itching, + hair loss Neurological: + tremors/no numbness/tingling/dizziness Psychiatric: no depression/anxiety  Past Medical History  Diagnosis Date  . Hypothyroidism   . High cholesterol   . Osteoarthritis   . Chronic combined systolic and diastolic CHF, NYHA class 3     a. 03/2013 Echo: EF 25-30%, diff HK, sev antsept HK, mild MR.  . Congestive dilated cardiomyopathy     a. 03/2013 Echo: EF 25-30%;  b. 09/2013 s/p SJM 3242 CRT-P.  Marland Kitchen Left bundle branch block   . Type II diabetes mellitus   . GERD (gastroesophageal reflux disease)   . Coronary artery disease     a. 08/2012 Cath: LM nl, LAD 77m, LCX nondom, mild-mod nonobs dzs mid, OM1 ok, OM2 90/90p, RCA 40, EF 25-30%-->Med Rx.   Past Surgical History  Procedure Laterality Date  . Abdominal hysterectomy  1980  . Bmd  2006  . Bilateral cateract surgery    . Bi-ventricular pacemaker insertion (crt-p)      a. 09/2013 s/p SJM 3242 CRT-P.  . Orif tibia plateau Right 12/04/2013    Procedure: OPEN REDUCTION INTERNAL FIXATION (ORIF) TIBIAL PLATEAU;  Surgeon: Verlee Rossetti, MD;  Location: [facility] ORS;  Service: Orthopedics;  Laterality: Right;  . Right heart catheterization N/A 09/08/2012    Procedure: RIGHT HEART CATH;  Surgeon: Dolores Patty, MD;  Location: [facility] CATH LAB;  Service: Cardiovascular;  Laterality: N/A;  . Bi-ventricular pacemaker insertion N/A 10/01/2013    Procedure: BI-VENTRICULAR PACEMAKER INSERTION (CRT-P);   Surgeon: Duke Salvia, MD;  Location: [facility] C[facility] LAB;  Service: Cardiovascular;  Laterality: N/A;   History   Social History  . Marital Status: Divorced    Spouse Name: N/A  . Number of Children: 4  . Years of Education: N/A   Occupational History  . Retired Engineer, civil (consulting)    Social History Main Topics  . Smoking status: Former Games developer  . Smokeless tobacco: Never Used  . Alcohol Use: No  . Drug Use: No  . Sexual Activity: Not Currently   Other Topics Concern  . Not on file   Social History Narrative   Widowed.  Lives with her son.  Normally able to ambulate without assistance.  Quit smoking as a teenager.              Current Outpatient Prescriptions on File Prior to Visit  Medication Sig Dispense Refill  . aspirin EC 81 MG EC tablet Take 1 tablet (81 mg total) by mouth daily. 30 tablet 0  . carvedilol (COREG) 12.5 MG tablet TAKE 1 TABLET TWICE A DAY WITH MEALS 180 tablet 5  . furosemide (LASIX) 20 MG tablet Take 20 mg by mouth every other day.    Marland Kitchen glipiZIDE (GLUCOTROL) 10 MG tablet TAKE 1 TABLET TWICE A DAY BEFORE MEALS 180 tablet 3  . HYDROcodone-acetaminophen (NORCO/VICODIN) 5-325 MG per tablet Take 1 tablet by mouth every 4 hours as needed *DO NOT EXCEED 3 GM APAP/24 HRS* 60 tablet 0  . hydrOXYzine (ATARAX/VISTARIL) 25 MG tablet Take 1 tablet (25 mg total) by mouth 3 (three) times daily as needed for itching. 270 tablet 3  . Insulin Pen Needle (NOVOFINE) 32G X 6 MM MISC 1 Act by Does not apply route daily. 100 each 3  . levothyroxine (SYNTHROID, LEVOTHROID) 125 MCG tablet Take 1 tablet (125 mcg total) by mouth daily. 90 tablet 1  . lisinopril (PRINIVIL,ZESTRIL) 5 MG tablet Take 1 tablet (5 mg total) by mouth daily. 90 tablet 3  . metFORMIN (GLUCOPHAGE-XR) 750 MG 24 hr tablet Take 1 tablet (750 mg total) by mouth daily. 90 tablet 3  . omeprazole (PRILOSEC) 20 MG capsule Take 1 capsule (20 mg total) by mouth daily. 90 capsule 3  . Pitavastatin Calcium (LIVALO) 2 MG TABS Take 1  tablet (2 mg total) by mouth daily. 30 tablet 11  . Vitamin D, Ergocalciferol, (DRISDOL) 50000 UNITS CAPS capsule Take 1 capsule (50,000 Units total) by mouth every 7 (seven) days. On Monday's 12 capsule 3  . Insulin Glargine (TOUJEO SOLOSTAR) 300 UNIT/ML SOPN Inject 30 Units into the skin daily. (Patient not taking: Reported on 12/06/2014) 1.5 mL 11   No current facility-administered medications on file prior to visit.   Allergies  Allergen Reactions  . Statins   . Lipitor [Atorvastatin]     Stomach ache  . Vytorin [Ezetimibe-Simvastatin] Other (See Comments)  Leg cramps   Family History  Problem Relation Age of Onset  . Diabetes Other   . Hypertension Other   . Uterine cancer Other   . Diabetes Mother   . Heart attack Brother    PE: BP 118/62 mmHg  Pulse 64  Temp(Src) 98.4 F (36.9 C) (Oral)  Resp 12  Ht  (1.6 m)  Wt 137 lb 3.2 oz (62.234 kg)  BMI 24.31 kg/m2  SpO2 96% Wt Readings from Last 3 Encounters:  12/06/14 137 lb 3.2 oz (62.234 kg)  12/01/14 137 lb 9.6 oz (62.415 kg)  11/09/14 138 lb 12.8 oz (62.959 kg)   Constitutional: normal weight, in NAD Eyes: PERRLA, EOMI, no exophthalmos ENT: moist mucous membranes, no thyromegaly, no cervical lymphadenopathy Cardiovascular: RRR, No MRG Respiratory: CTA B Gastrointestinal: abdomen soft, NT, ND, BS+ Musculoskeletal: no deformities, strength intact in all 4 Skin: moist, warm, no rashes Neurological: + Mild tremor with outstretched hands, DTR normal in all 4  ASSESSMENT: 1. DM2, insulin-dependent, uncontrolled, with complications - Peripheral neuropathy - CAD  Prefers not to start insulin  2. Hypothyroidism   PLAN:  1. Patient with long-standing, very uncontrolled diabetes, on oral antidiabetic regimen, which is insufficient. Toujeo was added by PCP less than a month ago, however, patient picked it up but did not start it. She does mention that this was expensive for her, but the reason for not starting it  is that she prefers not to take insulin. 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  I strongly advised her that her diabetes is so uncontrolled that I am very doubtful that we could manage her without insulin. I did suggest to continue the toujeo, but decrease the dose to 15 units and then increase as needed. She would like to try without insulin first, start documenting her sugars throughout the day, and return in 5 weeks to see if we actually need insulin or not. I agreed with this plan for now, but will double the Metformin dose. - We discussed about options for treatment, and I suggested to:  Patient Instructions  Please continue: - Glipizide 10 mg 2x a day  Increase: - Metformin ER to 750 mg 2x a day   Please return in 5 weeks with your sugar log.   - Strongly advised her to start checking sugars at different times of the day - check 2-3 times a day, rotating checks - given sugar log and advised how to fill it and to bring it at next appt  - given foot care handout and explained the principles  - given instructions for hypoglycemia management "15-15 rule"  - advised for yearly eye exams - Return to clinic in 1 mo with sugar log   2. Hypothyroidism - Noncompliance with her daily levothyroxine dose and also takes it incorrectly  -Strongly advised her to take the thyroid hormone every day, with water, >30 minutes before breakfast, separated by >4 hours from acid reflux medications, calcium, iron, multivitamins. - We will recheck her thyroid tests at next visit after she starts taking it correctly

## 2014-12-09 ENCOUNTER — Other Ambulatory Visit: Payer: Self-pay

## 2014-12-09 ENCOUNTER — Other Ambulatory Visit: Payer: Self-pay | Admitting: *Deleted

## 2014-12-09 DIAGNOSIS — E038 Other specified hypothyroidism: Secondary | ICD-10-CM

## 2014-12-09 DIAGNOSIS — E1149 Type 2 diabetes mellitus with other diabetic neurological complication: Secondary | ICD-10-CM

## 2014-12-09 MED ORDER — METFORMIN HCL ER 750 MG PO TB24: 750.0000 mg | ORAL_TABLET | Freq: Two times a day (BID) | ORAL | Status: DC

## 2014-12-09 MED ORDER — LEVOTHYROXINE SODIUM 125 MCG PO TABS: 125.0000 ug | ORAL_TABLET | Freq: Every day | ORAL | Status: DC

## 2014-12-21 ENCOUNTER — Encounter (HOSPITAL_COMMUNITY): Payer: Medicare Other

## 2015-01-11 ENCOUNTER — Ambulatory Visit (HOSPITAL_COMMUNITY)
Admission: RE | Admit: 2015-01-11 | Discharge: 2015-01-11 | Disposition: A | Discharge status: Home / Self Care | Payer: Medicare Other | Source: Ambulatory Visit | Attending: Cardiology | Admitting: Cardiology

## 2015-01-11 VITALS — BP 134/78 | HR 97 | Wt 138.1 lb

## 2015-01-11 DIAGNOSIS — E785 Hyperlipidemia, unspecified: Secondary | ICD-10-CM | POA: Diagnosis not present

## 2015-01-11 DIAGNOSIS — I5022 Chronic systolic (congestive) heart failure: Secondary | ICD-10-CM

## 2015-01-11 DIAGNOSIS — I2510 Atherosclerotic heart disease of native coronary artery without angina pectoris: Secondary | ICD-10-CM | POA: Diagnosis not present

## 2015-01-11 DIAGNOSIS — K219 Gastro-esophageal reflux disease without esophagitis: Secondary | ICD-10-CM | POA: Insufficient documentation

## 2015-01-11 MED ORDER — LISINOPRIL 5 MG PO TABS: 5.0000 mg | ORAL_TABLET | Freq: Two times a day (BID) | ORAL | Status: DC

## 2015-01-11 NOTE — Patient Instructions (Signed)
INCREASE Lisinopril to 5 mg, one pill twice a day  Labs needed in 10 days  Your physician recommends that you schedule a follow-up appointment in: 2 months  Do the following things EVERYDAY: 1) Weigh yourself in the morning before breakfast. Write it down and keep it in a log. 2) Take your medicines as prescribed 3) Eat low salt foods-Limit salt (sodium) to 2000 mg per day.  4) Stay as active as you can everyday 5) Limit all fluids for the day to less than 2 liters 6)

## 2015-01-11 NOTE — Progress Notes (Signed)
Patient ID: [name] [name], female   DOB: 06-08-1934, 81 y.o.   MRN: [id number] Primary Care: Dr. Sanda Linger  Baseline proBNP: 674 on 07/30/12  HPI: [name] [name] is a 81 y.o. AAF with history of diabetes, hypothyroidism, hypertension and diagnosed mixed diastolic and systolic heart failure during hospitalization on 07/30/12, EF 20-25%.  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   She was evaluated by her PCP 12/13 for progressive dyspnea.  EKG showed LBBB therefore she was sent to the ER and placed on BiPap.  She had an elevated d-dimer but CTA chest neg for PE.  She was admitted for respiratory failure and found to have depressed LVEF of 20-25%.  She was diuresed and stay was c/b hypotension with acute renal failure requiring short coarse of dopamine.  Discharge Cr 0.77 (peaked 3.11).  Discharge weight 141 pounds.    She returns for follow up. Overall feeling ok  Main complaint is ongoing fatigue.  Denies SOB/PND/CP. Able to walk around the grocery  store. Indigestion resolved. Taking lasix about once a week.  Weight at home 132- 135 pounds. Taking all medications except she stopped livalo due to cost and started lipitor.   Underwent Myoview 08/17/12: Overall Impression: High risk stress nuclear study. No ischemia or scar seen. Normal apical thinning is present. Severe LV systolic dysfunction. LV Ejection Fraction: 27%. LV Wall Motion: Severe global hypokinesis.   L & RHC 09/01/12 RA = 2  RV = 37/2/4  PA = 39/12 (22)  PCW = 11  Fick cardiac output/index = 3.0/1.8  Thermo CO/CI = 2.7/1.6  PVR = 4.9 Woods (Fick)  SVR = 2259  FA sat = 98%  PA sat = 57%, 59%  Ao Pressure: 135/57 (88)  LV Pressure: 136/15/20  There was no signficant gradient across the aortic valve on pullback.  Left main: Normal  LAD: Large vessel wraps apex. Gives off multiple small diagonals. 40% lesion in mid LAD. Mild plaque in apical LAD.  LCX: Nondominant. Mild to moderate non-obstructive  plaque in mid vessel. Small OM-1 which is OK. Large OM-2 with tandem 90% lesions in very proximal segment. Large OM-3 with long 70-80 lesion in proximal segment.  RCA: Dominant. Proximal and mid vessel heavily calcified with diffuse 40% lesion.  LV-gram done in the RAO projection: Ejection fraction = 25-30%. Global HK. Mild MR.  CAD treated medically as she was not having CP and LV dysfunction felt to be out of proportion to CAD.  ECHO -03/18/13  EF 20-25% with dysynchrony. Underwent implant of St Jude CRT-D 10/01/13 Echo 05/09/2014:   EF 25-30% with diffuse hypokinesis, mild LVH, and normal RV size with mildly decreased systolic function.   Labs 04/08/13 K 4.1 Creatinine 0.9 Hemoglobin A1C 12.6 Cholesterol 187 Triglycerides 362 HDL 40  Labs 4/15 LDL 70 Labs 7/15 K 3.5, creatinine 0.7 Labs 11/09/2014: K 3.8 Creatinine 0.79 Hgb A1C 13.7 TSH 32.86   Review of Systems: All pertinent positives and negatives as in HPI, otherwise negative.   Past Medical History  Diagnosis Date  . Hypothyroidism   . High cholesterol   . Osteoarthritis   . Chronic combined systolic and diastolic CHF, NYHA class 3     a. 03/2013 Echo: EF 25-30%, diff HK, sev antsept HK, mild MR.  . Congestive dilated cardiomyopathy     a. 03/2013 Echo: EF 25-30%;  b. 09/2013 s/p SJM 3242 CRT-P.  Marland Kitchen Left bundle branch block   . Type II diabetes  mellitus   . GERD (gastroesophageal reflux disease)   . Coronary artery disease     a. 08/2012 Cath: LM nl, LAD 108m, LCX nondom, mild-mod nonobs dzs mid, OM1 ok, OM2 90/90p, RCA 40, EF 25-30%-->Med Rx.    Current Outpatient Prescriptions  Medication Sig Dispense Refill  . aspirin EC 81 MG EC tablet Take 1 tablet (81 mg total) by mouth daily. 30 tablet 0  . atorvastatin (LIPITOR) 10 MG tablet Take 40 mg by mouth daily.    . carvedilol (COREG) 12.5 MG tablet TAKE 1 TABLET TWICE A DAY WITH MEALS 180 tablet 5  . furosemide (LASIX) 20 MG tablet Take 20 mg by mouth every other day.    Marland Kitchen glipiZIDE  (GLUCOTROL) 10 MG tablet TAKE 1 TABLET TWICE A DAY BEFORE MEALS 180 tablet 3  . HYDROcodone-acetaminophen (NORCO/VICODIN) 5-325 MG per tablet Take 1 tablet by mouth every 4 hours as needed *DO NOT EXCEED 3 GM APAP/24 HRS* 60 tablet 0  . hydrOXYzine (ATARAX/VISTARIL) 25 MG tablet Take 1 tablet (25 mg total) by mouth 3 (three) times daily as needed for itching. (Patient taking differently: Take 25 mg by mouth daily as needed for itching. ) 270 tablet 3  . Insulin Pen Needle (NOVOFINE) 32G X 6 MM MISC 1 Act by Does not apply route daily. 100 each 3  . levothyroxine (SYNTHROID, LEVOTHROID) 125 MCG tablet Take 1 tablet (125 mcg total) by mouth daily. 90 tablet 1  . lisinopril (PRINIVIL,ZESTRIL) 5 MG tablet Take 1 tablet (5 mg total) by mouth daily. 90 tablet 3  . metFORMIN (GLUCOPHAGE-XR) 750 MG 24 hr tablet Take 1 tablet (750 mg total) by mouth 2 (two) times daily. 180 tablet 1  . omeprazole (PRILOSEC) 20 MG capsule Take 1 capsule (20 mg total) by mouth daily. (Patient taking differently: Take 20 mg by mouth daily as needed. ) 90 capsule 3  . Vitamin D, Ergocalciferol, (DRISDOL) 50000 UNITS CAPS capsule Take 1 capsule (50,000 Units total) by mouth every 7 (seven) days. On Monday's 12 capsule 3  . Insulin Glargine (TOUJEO SOLOSTAR) 300 UNIT/ML SOPN Inject 30 Units into the skin daily. (Patient not taking: Reported on 12/06/2014) 1.5 mL 11  . Pitavastatin Calcium (LIVALO) 2 MG TABS Take 1 tablet (2 mg total) by mouth daily. (Patient not taking: Reported on 01/11/2015) 30 tablet 11   No current facility-administered medications for this encounter.    Allergies  Allergen Reactions  . Statins   . Vytorin [Ezetimibe-Simvastatin] Other (See Comments)    Leg cramps   PHYSICAL EXAM: Filed Vitals:   01/11/15 1130  BP: 134/78  Pulse: 97  Weight: 138 lb 1.9 oz (62.651 kg)  SpO2: 97%   General:  Elderly Well appearing. No respiratory difficulty  HEENT: normal Neck: supple. JVP 5-6 Carotids 2+ bilat; no  bruits. No lymphadenopathy or thryomegaly appreciated. Cor: PMI nondisplaced. Regular rate & rhythm. No rubs,  Murmurs. ICD site looks good.  Lungs: clear Abdomen: soft, nontender, nondistended. No hepatosplenomegaly. No bruits or masses. Good bowel sounds. Extremities: no cyanosis, clubbing, rash, edema.   Neuro: alert & oriented x 3, cranial nerves grossly intact. moves all 4 extremities w/o difficulty. Affect pleasant.  ASSESSMENT & PLAN: 1) Chronic systolic HF:  Primarily nonischemic cardiomyopathy (EF depressed out of proportion to coronary disease).  S/p St Jude CRT-D 2/15.  I reviewed today's echo, EF 25-30% (stable).  NYHA II-III. Volume status looks good.  Main complaint is fatigue.  - Continue carvedilol 12.5 mg BID. Will  not increase fatigue.  - Increase  lisinopril to 5 mg twice a day.   Check BMET today and in 10 days.  - Continue Lasix every other day.    - Reinforced need for daily weights and reviewed use of sliding scale diuretics. 2) CAD: Continue ASA and Livalo.  No exertional chest pain. 3) Hyperlipidemia: Elevated 11/09/2014 Cholesterol 240 TG 251 LDL 140. She stopped  livalo due to cost. She started lipitor. Will continue for now. Check lipids in 3 months.  4) GERD: Improved. Continue PPI.     Follow up in 3 months   Constance Whittle NP-C 01/11/2015

## 2015-01-11 NOTE — Progress Notes (Signed)
Advanced Heart Failure Medication Review by a Pharmacist  Does the patient  feel that his/her medications are working for him/her?  yes  Has the patient been experiencing any side effects to the medications prescribed?  no  Does the patient measure his/her own blood pressure or blood glucose at home?  no   Does the patient have any problems obtaining medications due to transportation or finances?   no  Understanding of regimen: good Understanding of indications: good Potential of compliance: good    Pharmacist comments: Patient presents to heart failure clinic and medications are reviewed with a pharmacist. She reports only taking her Lasix once a week rather than every other day. She also reports that Livalo was too expensive for her so she stopped taking it. She had a listed intolerance to atorvastatin (N/V), however she restarted this on her own because she had a lot of remaining tablets and reports that she has been tolerating it well. Has been taking Lipitor 40mg  daily for the past week. She also reports that she never started her insulin (Toujeo 30u daily on her med list) because she wanted to make lifestyle changes. Her metformin had also recently been increased to 750mg  BID. Most recent A1c 13.7%, discussed that lifestyle changes will likely not bring her A1c to goal. States that she has a f/u appt with her DM provider in the next month, encouraged her to keep this appt and discussed that she will likely need insulin at that time.  Kamora Vossler E. Brooks Kinnan, Pharm.D Clinical Pharmacy Resident Pager: [phone] 01/11/2015 12:03 PM

## 2015-01-17 ENCOUNTER — Ambulatory Visit: Payer: Medicare Other | Admitting: Internal Medicine

## 2015-01-23 ENCOUNTER — Ambulatory Visit (HOSPITAL_COMMUNITY)
Admission: RE | Admit: 2015-01-23 | Discharge: 2015-01-23 | Disposition: A | Discharge status: Home / Self Care | Payer: Medicare Other | Source: Ambulatory Visit | Attending: Internal Medicine | Admitting: Internal Medicine

## 2015-01-23 DIAGNOSIS — I5022 Chronic systolic (congestive) heart failure: Secondary | ICD-10-CM | POA: Diagnosis not present

## 2015-01-23 LAB — BASIC METABOLIC PANEL
ANION GAP: 8 (ref 5–15)
BUN: 14 mg/dL (ref 6–20)
CO2: 24 mmol/L (ref 22–32)
Calcium: 9.4 mg/dL (ref 8.9–10.3)
Chloride: 106 mmol/L (ref 101–111)
Creatinine, Ser: 0.81 mg/dL (ref 0.44–1.00)
GFR calc non Af Amer: >60 mL/min (ref >60)
GLUCOSE: 203 mg/dL — AB (ref 65–99)
Potassium: 4.1 mmol/L (ref 3.5–5.1)
Sodium: 138 mmol/L (ref 135–145)

## 2015-01-26 ENCOUNTER — Encounter: Payer: Self-pay | Admitting: Internal Medicine

## 2015-03-02 ENCOUNTER — Encounter: Payer: Self-pay | Admitting: Internal Medicine

## 2015-03-02 ENCOUNTER — Ambulatory Visit (INDEPENDENT_AMBULATORY_CARE_PROVIDER_SITE_OTHER): Payer: Medicare Other | Admitting: *Deleted

## 2015-03-02 DIAGNOSIS — I420 Dilated cardiomyopathy: Secondary | ICD-10-CM

## 2015-03-02 NOTE — Progress Notes (Signed)
Remote pacemaker transmission.   

## 2015-03-13 ENCOUNTER — Telehealth: Payer: Self-pay | Admitting: Internal Medicine

## 2015-03-13 DIAGNOSIS — G609 Hereditary and idiopathic neuropathy, unspecified: Secondary | ICD-10-CM

## 2015-03-13 MED ORDER — HYDROCODONE-ACETAMINOPHEN 5-325 MG PO TABS: ORAL_TABLET | ORAL | Status: DC

## 2015-03-13 NOTE — Telephone Encounter (Signed)
Patient is requesting a fill of HYDROcodone-acetaminophen (NORCO/VICODIN) 5-325 MG per tablet [[id number]] for her neuropathy pain. Verified pharmacy is [facility] on [address]

## 2015-03-18 LAB — CUP PACEART REMOTE DEVICE CHECK
Battery Remaining Percentage: 95.5 %
Battery Voltage: 2.99 V
Brady Statistic AP VP Percent: 15 %
Brady Statistic AS VP Percent: 85 %
Brady Statistic AS VS Percent: 1 %
Brady Statistic RA Percent Paced: 15 %
Date Time Interrogation Session: 20160721060026
Lead Channel Impedance Value: 1025 Ohm
Lead Channel Impedance Value: 410 Ohm
Lead Channel Impedance Value: 450 Ohm
Lead Channel Pacing Threshold Amplitude: 0.75 V
Lead Channel Pacing Threshold Amplitude: 1.125 V
Lead Channel Pacing Threshold Pulse Width: 0.4 ms
Lead Channel Pacing Threshold Pulse Width: 0.4 ms
Lead Channel Pacing Threshold Pulse Width: 1 ms
Lead Channel Sensing Intrinsic Amplitude: 2.4 mV
Lead Channel Setting Pacing Amplitude: 1.75 V
Lead Channel Setting Pacing Amplitude: 2 V
Lead Channel Setting Pacing Pulse Width: 0.4 ms
Lead Channel Setting Pacing Pulse Width: 1 ms
MDC IDC MSMT BATTERY REMAINING LONGEVITY: 97 mo
MDC IDC MSMT LEADCHNL RA PACING THRESHOLD AMPLITUDE: 0.75 V
MDC IDC MSMT LEADCHNL RV SENSING INTR AMPL: 12 mV
MDC IDC PG MODEL: 3242
MDC IDC SET LEADCHNL RV PACING AMPLITUDE: 2.125
MDC IDC SET LEADCHNL RV SENSING SENSITIVITY: 2 mV
MDC IDC STAT BRADY AP VS PERCENT: 1 %
Pulse Gen Serial Number: 7578583

## 2015-03-29 ENCOUNTER — Encounter: Payer: Self-pay | Admitting: Cardiology

## 2015-04-12 ENCOUNTER — Encounter: Payer: Self-pay | Admitting: Cardiology

## 2015-04-21 ENCOUNTER — Ambulatory Visit (INDEPENDENT_AMBULATORY_CARE_PROVIDER_SITE_OTHER): Payer: Medicare Other | Admitting: Podiatry

## 2015-04-21 ENCOUNTER — Encounter: Payer: Self-pay | Admitting: Podiatry

## 2015-04-21 VITALS — BP 139/68 | HR 69 | Resp 16

## 2015-04-21 DIAGNOSIS — E0840 Diabetes mellitus due to underlying condition with diabetic neuropathy, unspecified: Secondary | ICD-10-CM

## 2015-04-21 DIAGNOSIS — M79673 Pain in unspecified foot: Secondary | ICD-10-CM | POA: Diagnosis not present

## 2015-04-21 DIAGNOSIS — B351 Tinea unguium: Secondary | ICD-10-CM | POA: Diagnosis not present

## 2015-04-21 DIAGNOSIS — Q828 Other specified congenital malformations of skin: Secondary | ICD-10-CM

## 2015-04-21 DIAGNOSIS — E1040 Type 1 diabetes mellitus with diabetic neuropathy, unspecified: Secondary | ICD-10-CM | POA: Diagnosis not present

## 2015-04-24 NOTE — Progress Notes (Signed)
Subjective:     Patient ID: [name] [name], female   DOB: 02-21-1934, 81 y.o.   MRN: [id number]  HPI patient presents with painful nail disease 1-5 both feet with long-term diabetes and poor health with incurvation of the nailbeds that are sore and keratotic lesion distal third digit bilateral that are painful when pressed and she cannot cut along with long-term at risk diabetic condition   Review of Systems     Objective:   Physical Exam Neurovascular status diminished both sharp [employer] vibratory and PT DP pulses bilateral with diminished hair growth noted and patient's noted to have incurvated thickened nailbeds 1-5 both feet that are painful and distal keratotic lesions digits 3 bilateral with lucent like centers    Assessment:     At risk diabetic with mycotic nail infection and pain 1-5 both feet with lesions bilateral    Plan:     Reviewed conditions and debrided lesions on both feet with no iatrogenic bleeding noted and debrided nailbeds 1-5 both feet carefully taking the corners out to reduce pressure. Reappoint to recheck 3 months or earlier if any issues should occur

## 2015-04-26 DIAGNOSIS — M25561 Pain in right knee: Secondary | ICD-10-CM | POA: Diagnosis not present

## 2015-05-03 ENCOUNTER — Ambulatory Visit (INDEPENDENT_AMBULATORY_CARE_PROVIDER_SITE_OTHER): Payer: Medicare Other | Admitting: Internal Medicine

## 2015-05-03 ENCOUNTER — Encounter: Payer: Self-pay | Admitting: Internal Medicine

## 2015-05-03 ENCOUNTER — Other Ambulatory Visit (INDEPENDENT_AMBULATORY_CARE_PROVIDER_SITE_OTHER): Payer: Medicare Other | Admitting: *Deleted

## 2015-05-03 VITALS — BP 122/60 | HR 73 | Temp 98.0°F | Resp 12 | Wt 137.0 lb

## 2015-05-03 DIAGNOSIS — E039 Hypothyroidism, unspecified: Secondary | ICD-10-CM

## 2015-05-03 DIAGNOSIS — Z23 Encounter for immunization: Secondary | ICD-10-CM | POA: Diagnosis not present

## 2015-05-03 DIAGNOSIS — E1149 Type 2 diabetes mellitus with other diabetic neurological complication: Secondary | ICD-10-CM | POA: Diagnosis not present

## 2015-05-03 DIAGNOSIS — E1140 Type 2 diabetes mellitus with diabetic neuropathy, unspecified: Secondary | ICD-10-CM

## 2015-05-03 DIAGNOSIS — E0840 Diabetes mellitus due to underlying condition with diabetic neuropathy, unspecified: Secondary | ICD-10-CM | POA: Diagnosis not present

## 2015-05-03 LAB — POCT GLYCOSYLATED HEMOGLOBIN (HGB A1C): Hemoglobin A1C: 11.5

## 2015-05-03 LAB — T4, FREE: Free T4: 1.13 ng/dL (ref 0.60–1.60)

## 2015-05-03 LAB — TSH: TSH: 9.08 u[IU]/mL — ABNORMAL HIGH (ref 0.35–4.50)

## 2015-05-03 MED ORDER — GLIPIZIDE 10 MG PO TABS: ORAL_TABLET | ORAL | Status: DC

## 2015-05-03 MED ORDER — INSULIN PEN NEEDLE 32G X 4 MM MISC: Status: DC

## 2015-05-03 MED ORDER — INSULIN GLARGINE 100 UNIT/ML SOLOSTAR PEN: 14.0000 [IU] | PEN_INJECTOR | Freq: Every day | SUBCUTANEOUS | Status: DC

## 2015-05-03 MED ORDER — METFORMIN HCL ER 750 MG PO TB24: 750.0000 mg | ORAL_TABLET | Freq: Two times a day (BID) | ORAL | Status: DC

## 2015-05-03 NOTE — Patient Instructions (Signed)
Please continue: - Glipizide 10 mg 2x a day - Metformin ER 750 mg 2x a day   Please add Lantus 14 units in am.  When injecting insulin:  Inject in the abdomen  Rotate the injection sites around the belly button  Change needle for each injection  Keep needle in for 10 sec after last unit of insulin in  Keep the insulin in use out of the fridge  Please let me know if the sugars are consistently <80 or >200.  Please stop at the lab.  Please return in 6 weeks with your sugar log.

## 2015-05-03 NOTE — Progress Notes (Signed)
Patient ID: [name] [name], female   DOB: 04-22-1934, 81 y.o.   MRN: [id number]  HPI: [name] [name] is a 81 y.o.-year-old female, returning for f/u for DM2, dx in ~2006, insulin-dependent, uncontrolled, with complications (peripheral neuropathy, CAD). Last visit 5 mo ago, despite advice to return in 5 weeks.   She continues to get steroid inj's - had patellar fx 1.5 years ago.   DM2: Last hemoglobin A1c was: Lab Results  Component Value Date   HGBA1C 13.7* 11/09/2014   HGBA1C 11.7* 03/10/2014   HGBA1C 13.1* 12/04/2013   Pt is on a regimen of: - Metformin ER 750 mg 2x a day  - Glipizide 10 mg 2x a day Toujeo - added at 30 units at bedtime - added 11/09/2014, but did not start! She refused insulin.  Pt checks her sugars 1x a week and they are: - am: 230-260 >> 130, 170-200s - 2h after b'fast: n/c - before lunch: n/c - 2h after lunch: n/c - before dinner: n/c - 2h after dinner: n/c - bedtime: n/c   - nighttime: n/c >> 120-150 No lows. Lowest sugar was 140 >> 120. ? If she had hypoglycemia awareness.  Highest sugar was 300s.  Glucometer: AccuChek  Pt's meals are: - Breakfast: cold or hot cereal - Lunch: small sandwich - Dinner: vegetables + meat + bread - Snacks: 1/2 banana  - no CKD, last BUN/creatinine:  Lab Results  Component Value Date   BUN 14 01/23/2015   CREATININE 0.81 01/23/2015   - last set of lipids: Lab Results  Component Value Date   CHOL 240* 11/09/2014   HDL 38.90* 11/09/2014   LDLCALC 70 12/06/2013   LDLDIRECT 140.0 11/09/2014   TRIG 251.0* 11/09/2014   CHOLHDL 6 11/09/2014   - last eye exam was in 2015. No DR. + cataract OU >> implants. - + numbness and tingling in her feet. Has neuropathy. She is on Vicodin prn. This helps. Takes is rarely.  She also has hypothyroidism: I noticed that her latest TSH was very high - noncompliance with LT4:  Lab Results  Component Value Date   TSH 32.86* 11/09/2014   TSH 15.92* 03/10/2014   TSH 4.28 04/08/2013    TSH 5.75* 08/27/2012   TSH 3.64 07/30/2012   TSH 3.88 04/01/2012   Upon questioning, she is taking LT4 125 mcg: - every day - fasting.  - no PPI  - no calcium - no iron - on MVI later in the day  ROS: Constitutional: no weight gain/loss, no fatigue, no subjective hyperthermia/hypothermia Eyes: no blurry vision, no xerophthalmia ENT: no sore throat, no nodules palpated in throat, no dysphagia/odynophagia, no hoarseness Cardiovascular: no CP/SOB/palpitations/leg swelling Respiratory: no cough/SOB Gastrointestinal: no N/V/D/C/+ occasional heartburn Musculoskeletal: no muscle/+ joint aches Skin: no rashes Neurological: + tremors/no numbness/tingling/dizziness  I reviewed pt's medications, allergies, PMH, social hx, family hx, and changes were documented in the history of present illness. Otherwise, unchanged from my initial visit note.  Past Medical History  Diagnosis Date  . Hypothyroidism   . High cholesterol   . Osteoarthritis   . Chronic combined systolic and diastolic CHF, NYHA class 3     a. 03/2013 Echo: EF 25-30%, diff HK, sev antsept HK, mild MR.  . Congestive dilated cardiomyopathy     a. 03/2013 Echo: EF 25-30%;  b. 09/2013 s/p SJM 3242 CRT-P.  Marland Kitchen Left bundle branch block   . Type II diabetes mellitus   . GERD (gastroesophageal reflux disease)   . Coronary artery disease  a. 08/2012 Cath: LM nl, LAD 53m, LCX nondom, mild-mod nonobs dzs mid, OM1 ok, OM2 90/90p, RCA 40, EF 25-30%-->Med Rx.   Past Surgical History  Procedure Laterality Date  . Abdominal hysterectomy  1980  . Bmd  2006  . Bilateral cateract surgery    . Bi-ventricular pacemaker insertion (crt-p)      a. 09/2013 s/p SJM 3242 CRT-P.  . Orif tibia plateau Right 12/04/2013    Procedure: OPEN REDUCTION INTERNAL FIXATION (ORIF) TIBIAL PLATEAU;  Surgeon: Verlee Rossetti, MD;  Location: [facility];  Service: Orthopedics;  Laterality: Right;  . Right heart catheterization N/A 09/08/2012    Procedure: RIGHT HEART  CATH;  Surgeon: Dolores Patty, MD;  Location: [facility] CATH LAB;  Service: Cardiovascular;  Laterality: N/A;  . Bi-ventricular pacemaker insertion N/A 10/01/2013    Procedure: BI-VENTRICULAR PACEMAKER INSERTION (CRT-P);  Surgeon: Duke Salvia, MD;  Location: [facility] CATH LAB;  Service: Cardiovascular;  Laterality: N/A;   Social History   Social History  . Marital Status: Divorced    Spouse Name: N/A  . Number of Children: 4  . Years of Education: N/A   Occupational History  . Retired Engineer, civil (consulting)    Social History Main Topics  . Smoking status: Former Games developer  . Smokeless tobacco: Never Used  . Alcohol Use: No  . Drug Use: No  . Sexual Activity: Not Currently   Other Topics Concern  . Not on file   Social History Narrative   Widowed.  Lives with her son.  Normally able to ambulate without assistance.  Quit smoking as a teenager.              Current Outpatient Prescriptions on File Prior to Visit  Medication Sig Dispense Refill  . aspirin EC 81 MG EC tablet Take 1 tablet (81 mg total) by mouth daily. 30 tablet 0  . atorvastatin (LIPITOR) 10 MG tablet Take 40 mg by mouth daily.    . carvedilol (COREG) 12.5 MG tablet TAKE 1 TABLET TWICE A DAY WITH MEALS 180 tablet 5  . furosemide (LASIX) 20 MG tablet Take 20 mg by mouth every other day.    Marland Kitchen glipiZIDE (GLUCOTROL) 10 MG tablet TAKE 1 TABLET TWICE A DAY BEFORE MEALS 180 tablet 3  . HYDROcodone-acetaminophen (NORCO/VICODIN) 5-325 MG per tablet Take 1 tablet by mouth every 4 hours as needed *DO NOT EXCEED 3 GM APAP/24 HRS* 60 tablet 0  . hydrOXYzine (ATARAX/VISTARIL) 25 MG tablet Take 1 tablet (25 mg total) by mouth 3 (three) times daily as needed for itching. (Patient taking differently: Take 25 mg by mouth daily as needed for itching. ) 270 tablet 3  . Insulin Pen Needle (NOVOFINE) 32G X 6 MM MISC 1 Act by Does not apply route daily. 100 each 3  . levothyroxine (SYNTHROID, LEVOTHROID) 125 MCG tablet Take 1 tablet (125 mcg total) by mouth  daily. 90 tablet 1  . lisinopril (PRINIVIL,ZESTRIL) 5 MG tablet Take 1 tablet (5 mg total) by mouth 2 (two) times daily. 180 tablet 3  . metFORMIN (GLUCOPHAGE-XR) 750 MG 24 hr tablet Take 1 tablet (750 mg total) by mouth 2 (two) times daily. 180 tablet 1  . omeprazole (PRILOSEC) 20 MG capsule Take 1 capsule (20 mg total) by mouth daily. (Patient taking differently: Take 20 mg by mouth daily as needed. ) 90 capsule 3  . Pitavastatin Calcium (LIVALO) 2 MG TABS Take 1 tablet (2 mg total) by mouth daily. 30 tablet 11  . Vitamin D,  Ergocalciferol, (DRISDOL) 50000 UNITS CAPS capsule Take 1 capsule (50,000 Units total) by mouth every 7 (seven) days. On Monday's 12 capsule 3  . Insulin Glargine (TOUJEO SOLOSTAR) 300 UNIT/ML SOPN Inject 30 Units into the skin daily. (Patient not taking: Reported on 05/03/2015) 1.5 mL 11   No current facility-administered medications on file prior to visit.   Allergies  Allergen Reactions  . Statins   . Vytorin [Ezetimibe-Simvastatin] Other (See Comments)    Leg cramps   Family History  Problem Relation Age of Onset  . Diabetes Other   . Hypertension Other   . Uterine cancer Other   . Diabetes Mother   . Heart attack Brother    PE: BP 122/60 mmHg  Pulse 73  Temp(Src) 98 F (36.7 C) (Oral)  Resp 12  Wt 137 lb (62.143 kg)  SpO2 97% Body mass index is 24.27 kg/(m^2). Wt Readings from Last 3 Encounters:  05/03/15 137 lb (62.143 kg)  01/11/15 138 lb 1.9 oz (62.651 kg)  12/06/14 137 lb 3.2 oz (62.234 kg)   Constitutional: normal weight, in NAD Eyes: PERRLA, EOMI, no exophthalmos ENT: moist mucous membranes, no thyromegaly, no cervical lymphadenopathy Cardiovascular: RRR, No MRG Respiratory: CTA B Gastrointestinal: abdomen soft, NT, ND, BS+ Musculoskeletal: no deformities, strength intact in all 4 Skin: moist, warm, no rashes Neurological: + Mild tremor with outstretched hands, DTR normal in all 4  ASSESSMENT: 1. DM2, insulin-dependent, uncontrolled,  with complications - Peripheral neuropathy - CAD  2. Hypothyroidism   PLAN:  1. Patient with long-standing, very uncontrolled diabetes, on oral antidiabetic regimen, which is insufficient, but sugars improved after doubling Metformin XR dose to 1500 mg daily. Toujeo was suggested by PCP and also by me at last visit >> refused. At this visit, as the HbA1c is still very high (although, better, at 11.5%) >> I suggested basal insulin again >> she agrees to start Lantus. She is still getting steroid inj, I hope we can decrease the dose of Lantus after se stops getting the steroids. - I suggested to:  Patient Instructions  Please continue: - Glipizide 10 mg 2x a day - Metformin ER 750 mg 2x a day   Please add Lantus 14 units in am.  When injecting insulin:  Inject in the abdomen  Rotate the injection sites around the belly button  Change needle for each injection  Keep needle in for 10 sec after last unit of insulin in  Keep the insulin in use out of the fridge  Please let me know if the sugars are consistently <80 or >200.  Please stop at the lab.  Please return in 6 weeks with your sugar log.   - Strongly advised her to start checking sugars 2-3 times a day, rotating checks - given more sugar logs - advised for yearly eye exams - Return to clinic in 3 mo with sugar log   2. Hypothyroidism - now states she is compliant with her daily levothyroxine dose  -Strongly advised her to take the thyroid hormone every day, with water, >30 minutes before breakfast, separated by >4 hours from acid reflux medications, calcium, iron, multivitamins. She takes it correctly - We will recheck her thyroid tests now   Component     Latest Ref Rng 05/03/2015  TSH     0.35 - 4.50 uIU/mL 9.08 (H)  Free T4     0.60 - 1.60 ng/dL 1.61  TSH has improved and she started to take her levothyroxine more consistently. I will continue  the 125 g levothyroxine daily for now and recheck at next visit. If  the TSH is still high, we may need to increase the dose.

## 2015-05-12 ENCOUNTER — Other Ambulatory Visit (HOSPITAL_COMMUNITY): Payer: Self-pay | Admitting: Anesthesiology

## 2015-05-17 DIAGNOSIS — M25561 Pain in right knee: Secondary | ICD-10-CM | POA: Diagnosis not present

## 2015-05-22 ENCOUNTER — Other Ambulatory Visit: Payer: Self-pay | Admitting: Orthopedic Surgery

## 2015-05-22 DIAGNOSIS — R52 Pain, unspecified: Secondary | ICD-10-CM

## 2015-06-05 ENCOUNTER — Ambulatory Visit (INDEPENDENT_AMBULATORY_CARE_PROVIDER_SITE_OTHER): Payer: Medicare Other | Admitting: *Deleted

## 2015-06-05 DIAGNOSIS — I420 Dilated cardiomyopathy: Secondary | ICD-10-CM

## 2015-06-05 DIAGNOSIS — I5022 Chronic systolic (congestive) heart failure: Secondary | ICD-10-CM | POA: Diagnosis not present

## 2015-06-06 NOTE — Progress Notes (Signed)
LOOP RECORDER  

## 2015-06-07 NOTE — Progress Notes (Signed)
Error below -- Remote pacemaker transmission.  

## 2015-06-09 ENCOUNTER — Encounter: Payer: Self-pay | Admitting: Cardiology

## 2015-06-09 LAB — CUP PACEART REMOTE DEVICE CHECK
Battery Remaining Percentage: 95.5 %
Battery Voltage: 2.99 V
Date Time Interrogation Session: 20161024060013
Implantable Lead Implant Date: 20150220
Implantable Lead Implant Date: 20150220
Implantable Lead Location: 753859
Lead Channel Impedance Value: 400 Ohm
Lead Channel Pacing Threshold Amplitude: 0.75 V
Lead Channel Pacing Threshold Amplitude: 0.75 V
Lead Channel Pacing Threshold Pulse Width: 0.4 ms
Lead Channel Pacing Threshold Pulse Width: 1 ms
Lead Channel Sensing Intrinsic Amplitude: 2.6 mV
Lead Channel Setting Pacing Amplitude: 1.75 V
Lead Channel Setting Pacing Amplitude: 2 V
Lead Channel Setting Pacing Pulse Width: 1 ms
Lead Channel Setting Sensing Sensitivity: 2 mV
MDC IDC LEAD IMPLANT DT: 20150220
MDC IDC LEAD LOCATION: 753858
MDC IDC LEAD LOCATION: 753860
MDC IDC MSMT BATTERY REMAINING LONGEVITY: 97 mo
MDC IDC MSMT LEADCHNL LV IMPEDANCE VALUE: 960 Ohm
MDC IDC MSMT LEADCHNL RA PACING THRESHOLD PULSEWIDTH: 0.4 ms
MDC IDC MSMT LEADCHNL RV IMPEDANCE VALUE: 440 Ohm
MDC IDC MSMT LEADCHNL RV PACING THRESHOLD AMPLITUDE: 0.875 V
MDC IDC MSMT LEADCHNL RV SENSING INTR AMPL: 10.5 mV
MDC IDC PG MODEL: 3242
MDC IDC PG SERIAL: 7578583
MDC IDC SET LEADCHNL LV PACING AMPLITUDE: 2 V
MDC IDC SET LEADCHNL RV PACING PULSEWIDTH: 0.4 ms
MDC IDC STAT BRADY AP VP PERCENT: 15 %
MDC IDC STAT BRADY AP VS PERCENT: 1 %
MDC IDC STAT BRADY AS VP PERCENT: 85 %
MDC IDC STAT BRADY AS VS PERCENT: 1 %
MDC IDC STAT BRADY RA PERCENT PACED: 15 %

## 2015-06-16 NOTE — Telephone Encounter (Signed)
Pt never pick-up rx voiding script...[name] [name]

## 2015-06-22 ENCOUNTER — Telehealth: Payer: Self-pay | Admitting: Internal Medicine

## 2015-06-22 NOTE — Telephone Encounter (Signed)
Pt called in said that she is in pain and she said that Dr Maudry Mayhew normally calls in pain meds in for her.  I explain to her that Dr Maudry Mayhew would be back in office on Monday.  I told her i would forward to nurse and see if there were any other options.  Joyice Faster can you call her when you get a chance

## 2015-06-22 NOTE — Telephone Encounter (Signed)
No voicemail. We have not seen this pt since march of this yr. Her last pain med that was prescribed was back in august and she never came to pick it up per Valentina Gu on a phone note. She will have to be seen from either Dr. Yetta Barre or another provider

## 2015-06-23 ENCOUNTER — Encounter: Payer: Self-pay | Admitting: Cardiology

## 2015-06-27 ENCOUNTER — Encounter: Payer: Self-pay | Admitting: Internal Medicine

## 2015-06-27 ENCOUNTER — Ambulatory Visit (INDEPENDENT_AMBULATORY_CARE_PROVIDER_SITE_OTHER): Payer: Medicare Other | Admitting: Internal Medicine

## 2015-06-27 VITALS — BP 112/60 | HR 65 | Temp 97.8°F | Resp 12 | Wt 139.8 lb

## 2015-06-27 DIAGNOSIS — E1140 Type 2 diabetes mellitus with diabetic neuropathy, unspecified: Secondary | ICD-10-CM | POA: Diagnosis not present

## 2015-06-27 DIAGNOSIS — Z794 Long term (current) use of insulin: Secondary | ICD-10-CM

## 2015-06-27 DIAGNOSIS — E038 Other specified hypothyroidism: Secondary | ICD-10-CM

## 2015-06-27 LAB — T4, FREE: Free T4: 1.04 ng/dL (ref 0.60–1.60)

## 2015-06-27 LAB — TSH: TSH: 5.6 u[IU]/mL — ABNORMAL HIGH (ref 0.35–4.50)

## 2015-06-27 NOTE — Patient Instructions (Addendum)
Please continue: - Glipizide 10 mg 2x a day - Metformin ER 750 mg 2x a day   Please start taking Lantus 14 units in am - take it every day! If sugars <60, skip Lantus that day.  Please let me know if the sugars are consistently <80 or >200.  Please stop at the lab.  Please return in 6 weeks with your sugar log.

## 2015-06-27 NOTE — Progress Notes (Signed)
Patient ID: [name] [name], female   DOB: [birth year], 81 y.o.   MRN: [id number]  HPI: [name] [name] is a 81 y.o.-year-old female, returning for f/u for DM2, dx in ~2006, insulin-dependent, uncontrolled, with complications (peripheral neuropathy, CAD). Last visit 2 mo ago.  DM2: Last hemoglobin A1c was: Lab Results  Component Value Date   HGBA1C 11.5 05/03/2015   HGBA1C 13.7* 11/09/2014   HGBA1C 11.7* 03/10/2014  She continues to get steroid inj's.  Pt is on a regimen of: - Metformin ER 750 mg 2x a day  - Glipizide 10 mg 2x a day - Lantus 14 units in am added 04/2015 - not taking if <150!!!? - may have taken it 3x a week!  Pt checks her sugars 1x a day: - am: 230-260 >> 130, 170-200s >> 89-240 - 2h after brunch: n/c >> 123, 189, 199 - before lunch: n/c - 2h after lunch: n/c >> 226 - before dinner: n/c - 2h after dinner: n/c - bedtime: n/c   - nighttime: n/c >> 120-150 No lows. Lowest sugar was 140 >> 120 >> 89. ? If she had hypoglycemia awareness.  Highest sugar was 300s >> 240.  Glucometer: AccuChek  Pt's meals are: - Breakfast: cold or hot cereal - Lunch: small sandwich - Dinner: vegetables + meat + bread - Snacks: 1/2 banana  - no CKD, last BUN/creatinine:  Lab Results  Component Value Date   BUN 14 01/23/2015   CREATININE 0.81 01/23/2015   - last set of lipids: Lab Results  Component Value Date   CHOL 240* 11/09/2014   HDL 38.90* 11/09/2014   LDLCALC 70 12/06/2013   LDLDIRECT 140.0 11/09/2014   TRIG 251.0* 11/09/2014   CHOLHDL 6 11/09/2014   - last eye exam was in 2015. No DR. + cataract OU >> implants. - + numbness and tingling in her feet. Has neuropathy. She is on Hydrocodone for neuropathic pain. This is the only thing that helps. Tried Lyrica and Neurontin >> did not help.  She also has hypothyroidism: I noticed that her latest TSH was very high - noncompliance with LT4 - at last visit TSH still high, but we did not change the dose but advised her how  to take it correctly.  Lab Results  Component Value Date   TSH 9.08* 05/03/2015   TSH 32.86* 11/09/2014   TSH 15.92* 03/10/2014   TSH 4.28 04/08/2013   TSH 5.75* 08/27/2012   TSH 3.64 07/30/2012   FREET4 1.13 05/03/2015   She is taking LT4 125 mcg: - every day - fasting.  - no PPI  - no calcium - no iron - on MVI later in the day  ROS: Constitutional: no weight gain/loss, no fatigue, no subjective hyperthermia/hypothermia Eyes: no blurry vision, no xerophthalmia ENT: no sore throat, no nodules palpated in throat, no dysphagia/odynophagia, no hoarseness Cardiovascular: no CP/SOB/palpitations/leg swelling Respiratory: no cough/SOB Gastrointestinal: no N/V/D/C/+ heartburn Musculoskeletal: no muscle/+ joint aches Skin: no rashes Neurological: + tremors/no numbness/tingling/dizziness  I reviewed pt's medications, allergies, PMH, social hx, family hx, and changes were documented in the history of present illness. Otherwise, unchanged from my initial visit note.  Past Medical History  Diagnosis Date  . Hypothyroidism   . High cholesterol   . Osteoarthritis   . Chronic combined systolic and diastolic CHF, NYHA class 3 (HCC)     a. 03/2013 Echo: EF 25-30%, diff HK, sev antsept HK, mild MR.  . Congestive dilated cardiomyopathy (HCC)     a. 03/2013 Echo: EF 25-30%;  b. 09/2013 s/p SJM 3242 CRT-P.  Marland Kitchen Left bundle branch block   . Type II diabetes mellitus (HCC)   . GERD (gastroesophageal reflux disease)   . Coronary artery disease     a. 08/2012 Cath: LM nl, LAD 36m, LCX nondom, mild-mod nonobs dzs mid, OM1 ok, OM2 90/90p, RCA 40, EF 25-30%-->Med Rx.   Past Surgical History  Procedure Laterality Date  . Abdominal hysterectomy  1980  . Bmd  2006  . Bilateral cateract surgery    . Bi-ventricular pacemaker insertion (crt-p)      a. 09/2013 s/p SJM 3242 CRT-P.  . Orif tibia plateau Right 12/04/2013    Procedure: OPEN REDUCTION INTERNAL FIXATION (ORIF) TIBIAL PLATEAU;  Surgeon: Verlee Rossetti, MD;  Location: [facility];  Service: Orthopedics;  Laterality: Right;  . Right heart catheterization N/A 09/08/2012    Procedure: RIGHT HEART CATH;  Surgeon: Dolores Patty, MD;  Location: [facility] CATH LAB;  Service: Cardiovascular;  Laterality: N/A;  . Bi-ventricular pacemaker insertion N/A 10/01/2013    Procedure: BI-VENTRICULAR PACEMAKER INSERTION (CRT-P);  Surgeon: Duke Salvia, MD;  Location: [facility] CATH LAB;  Service: Cardiovascular;  Laterality: N/A;   Social History   Social History  . Marital Status: Divorced    Spouse Name: N/A  . Number of Children: 4  . Years of Education: N/A   Occupational History  . Retired Engineer, civil (consulting)    Social History Main Topics  . Smoking status: Former Games developer  . Smokeless tobacco: Never Used  . Alcohol Use: No  . Drug Use: No  . Sexual Activity: Not Currently   Other Topics Concern  . Not on file   Social History Narrative   Widowed.  Lives with her son.  Normally able to ambulate without assistance.  Quit smoking as a teenager.              Current Outpatient Prescriptions on File Prior to Visit  Medication Sig Dispense Refill  . aspirin EC 81 MG EC tablet Take 1 tablet (81 mg total) by mouth daily. 30 tablet 0  . atorvastatin (LIPITOR) 10 MG tablet Take 40 mg by mouth daily.    . carvedilol (COREG) 12.5 MG tablet TAKE 1 TABLET TWICE A DAY WITH MEALS 180 tablet 5  . furosemide (LASIX) 20 MG tablet Take 20 mg by mouth every other day.    Marland Kitchen glipiZIDE (GLUCOTROL) 10 MG tablet TAKE 1 TABLET TWICE A DAY BEFORE MEALS 180 tablet 1  . HYDROcodone-acetaminophen (NORCO/VICODIN) 5-325 MG per tablet Take 1 tablet by mouth every 4 hours as needed *DO NOT EXCEED 3 GM APAP/24 HRS* 60 tablet 0  . hydrOXYzine (ATARAX/VISTARIL) 25 MG tablet Take 1 tablet (25 mg total) by mouth 3 (three) times daily as needed for itching. (Patient taking differently: Take 25 mg by mouth daily as needed for itching. ) 270 tablet 3  . Insulin Glargine (LANTUS SOLOSTAR) 100  UNIT/ML Solostar Pen Inject 14 Units into the skin daily before breakfast. 15 mL 2  . Insulin Pen Needle 32G X 4 MM MISC Use 1x a day 100 each 1  . levothyroxine (SYNTHROID, LEVOTHROID) 125 MCG tablet Take 1 tablet (125 mcg total) by mouth daily. 90 tablet 1  . lisinopril (PRINIVIL,ZESTRIL) 5 MG tablet Take 1 tablet (5 mg total) by mouth 2 (two) times daily. 180 tablet 3  . metFORMIN (GLUCOPHAGE-XR) 750 MG 24 hr tablet Take 1 tablet (750 mg total) by mouth 2 (two) times daily. 180 tablet 1  .  omeprazole (PRILOSEC) 20 MG capsule Take 1 capsule (20 mg total) by mouth daily. (Patient taking differently: Take 20 mg by mouth daily as needed. ) 90 capsule 3  . Pitavastatin Calcium (LIVALO) 2 MG TABS Take 1 tablet (2 mg total) by mouth daily. 30 tablet 11  . Vitamin D, Ergocalciferol, (DRISDOL) 50000 UNITS CAPS capsule Take 1 capsule (50,000 Units total) by mouth every 7 (seven) days. On Monday's 12 capsule 3   No current facility-administered medications on file prior to visit.   Allergies  Allergen Reactions  . Statins   . Vytorin [Ezetimibe-Simvastatin] Other (See Comments)    Leg cramps   Family History  Problem Relation Age of Onset  . Diabetes Other   . Hypertension Other   . Uterine cancer Other   . Diabetes Mother   . Heart attack Brother    PE: BP 112/60 mmHg  Pulse 65  Temp(Src) 97.8 F (36.6 C) (Oral)  Resp 12  Wt 139 lb 12.8 oz (63.413 kg)  SpO2 98% Body mass index is 24.77 kg/(m^2). Wt Readings from Last 3 Encounters:  06/27/15 139 lb 12.8 oz (63.413 kg)  05/03/15 137 lb (62.143 kg)  01/11/15 138 lb 1.9 oz (62.651 kg)   Constitutional: normal weight, in NAD Eyes: PERRLA, EOMI, no exophthalmos ENT: moist mucous membranes, no thyromegaly, no cervical lymphadenopathy Cardiovascular: RRR, No MRG Respiratory: CTA B Gastrointestinal: abdomen soft, NT, ND, BS+ Musculoskeletal: no deformities, strength intact in all 4 Skin: moist, warm, no rashes Neurological: + Mild  tremor with outstretched hands, DTR normal in all 4  ASSESSMENT: 1. DM2, insulin-dependent, uncontrolled, with complications - Peripheral neuropathy - CAD  2. Hypothyroidism   PLAN:  1. Patient with long-standing, very uncontrolled diabetes, on oral antidiabetic regimen, now also on basal insulin added at last visit. Sugars are better, but she is taking it <50% of the time (not taking it if sugars <150). I advised her to take it EVERY DAY, except if her CBG is <60. We need to absolutely avoid lows due to age and comorbidities. - I suggested to:  Patient Instructions  Please continue: - Glipizide 10 mg 2x a day - Metformin ER 750 mg 2x a day   Please start taking Lantus 14 units in am - take it every day! If sugars <60, skip Lantus that day.  Please let me know if the sugars are consistently <80 or >200.  Please stop at the lab.  Please return in 6 weeks with your sugar log.   - Strongly advised her to start checking sugars 2-3 times a day, rotating checks - given more sugar logs - advised for yearly eye exams. She needs a new one. - Return to clinic in 1.5 mo with sugar log   2. Hypothyroidism - now compliant with her daily levothyroxine dose  - Again advised her to take the thyroid hormone every day, with water, >30 minutes before breakfast, separated by >4 hours from acid reflux medications, calcium, iron, multivitamins. She takes it correctly - We will recheck her thyroid tests now - Continue levothyroxine 125 g daily for now   Office Visit on 06/27/2015  Component Date Value Ref Range Status  . TSH 06/27/2015 5.60* 0.35 - 4.50 uIU/mL Final  . Free T4 06/27/2015 1.04  0.60 - 1.60 ng/dL Final   TSH is still higher than normal, but definitely improved, despite not changing the dose but only working on compliance. I will continue the same dose of levothyroxine for now as I  feel that she is still working on taking the medication every day.

## 2015-07-12 ENCOUNTER — Other Ambulatory Visit: Payer: Self-pay | Admitting: Internal Medicine

## 2015-07-12 DIAGNOSIS — G609 Hereditary and idiopathic neuropathy, unspecified: Secondary | ICD-10-CM

## 2015-07-12 MED ORDER — HYDROCODONE-ACETAMINOPHEN 5-325 MG PO TABS: ORAL_TABLET | ORAL | Status: DC

## 2015-07-12 NOTE — Telephone Encounter (Signed)
Please advise 

## 2015-07-12 NOTE — Telephone Encounter (Signed)
Pt is requesting a prescription for HYDROcodone-acetaminophen (NORCO/VICODIN) 5-325 MG per tablet [[id number]] for neuropathy pain. She's been seeing her endocrinologist but she won't prescribe this for her.

## 2015-07-13 NOTE — Telephone Encounter (Signed)
Pt informed

## 2015-08-15 ENCOUNTER — Ambulatory Visit: Payer: Medicare Other | Admitting: Podiatry

## 2015-08-17 ENCOUNTER — Other Ambulatory Visit: Payer: Self-pay | Admitting: Internal Medicine

## 2015-08-28 ENCOUNTER — Encounter (HOSPITAL_COMMUNITY): Payer: Self-pay | Admitting: Internal Medicine

## 2015-08-28 ENCOUNTER — Ambulatory Visit (HOSPITAL_COMMUNITY)
Admission: RE | Admit: 2015-08-28 | Discharge: 2015-08-28 | Disposition: A | Discharge status: Home / Self Care | Payer: Medicare Other | Source: Ambulatory Visit | Attending: Internal Medicine | Admitting: Internal Medicine

## 2015-08-28 VITALS — BP 130/72 | HR 73 | Wt 143.2 lb

## 2015-08-28 DIAGNOSIS — E785 Hyperlipidemia, unspecified: Secondary | ICD-10-CM | POA: Diagnosis not present

## 2015-08-28 DIAGNOSIS — I5022 Chronic systolic (congestive) heart failure: Secondary | ICD-10-CM | POA: Diagnosis not present

## 2015-08-28 DIAGNOSIS — E119 Type 2 diabetes mellitus without complications: Secondary | ICD-10-CM | POA: Diagnosis not present

## 2015-08-28 DIAGNOSIS — I110 Hypertensive heart disease with heart failure: Secondary | ICD-10-CM | POA: Insufficient documentation

## 2015-08-28 DIAGNOSIS — E039 Hypothyroidism, unspecified: Secondary | ICD-10-CM | POA: Diagnosis not present

## 2015-08-28 DIAGNOSIS — Z7982 Long term (current) use of aspirin: Secondary | ICD-10-CM | POA: Diagnosis not present

## 2015-08-28 DIAGNOSIS — Z95810 Presence of automatic (implantable) cardiac defibrillator: Secondary | ICD-10-CM | POA: Diagnosis not present

## 2015-08-28 DIAGNOSIS — R5383 Other fatigue: Secondary | ICD-10-CM | POA: Diagnosis not present

## 2015-08-28 DIAGNOSIS — Z87891 Personal history of nicotine dependence: Secondary | ICD-10-CM | POA: Diagnosis not present

## 2015-08-28 DIAGNOSIS — I428 Other cardiomyopathies: Secondary | ICD-10-CM | POA: Diagnosis not present

## 2015-08-28 DIAGNOSIS — Z794 Long term (current) use of insulin: Secondary | ICD-10-CM | POA: Insufficient documentation

## 2015-08-28 DIAGNOSIS — I2510 Atherosclerotic heart disease of native coronary artery without angina pectoris: Secondary | ICD-10-CM | POA: Insufficient documentation

## 2015-08-28 DIAGNOSIS — Z888 Allergy status to other drugs, medicaments and biological substances status: Secondary | ICD-10-CM | POA: Insufficient documentation

## 2015-08-28 DIAGNOSIS — K219 Gastro-esophageal reflux disease without esophagitis: Secondary | ICD-10-CM | POA: Diagnosis not present

## 2015-08-28 DIAGNOSIS — Z79899 Other long term (current) drug therapy: Secondary | ICD-10-CM | POA: Insufficient documentation

## 2015-08-28 DIAGNOSIS — E7800 Pure hypercholesterolemia, unspecified: Secondary | ICD-10-CM | POA: Diagnosis not present

## 2015-08-28 LAB — BASIC METABOLIC PANEL
ANION GAP: 10 (ref 5–15)
BUN: 19 mg/dL (ref 6–20)
CO2: 23 mmol/L (ref 22–32)
Calcium: 9 mg/dL (ref 8.9–10.3)
Chloride: 106 mmol/L (ref 101–111)
Creatinine, Ser: 0.92 mg/dL (ref 0.44–1.00)
GFR calc non Af Amer: 58 mL/min — ABNORMAL LOW (ref >60)
GLUCOSE: 270 mg/dL — AB (ref 65–99)
POTASSIUM: 4.3 mmol/L (ref 3.5–5.1)
Sodium: 139 mmol/L (ref 135–145)

## 2015-08-28 MED ORDER — SACUBITRIL-VALSARTAN 24-26 MG PO TABS: 1.0000 | ORAL_TABLET | Freq: Two times a day (BID) | ORAL | Status: DC

## 2015-08-28 MED ORDER — LISINOPRIL 5 MG PO TABS: 5.0000 mg | ORAL_TABLET | Freq: Two times a day (BID) | ORAL | Status: DC

## 2015-08-28 NOTE — Patient Instructions (Signed)
Stop lisionpril.  Start Entresto 24/26 Wednesday night.  Follow up: 2 month with echocardiogram (Dr.[email or web address])

## 2015-08-28 NOTE — Progress Notes (Signed)
Patient ID: [name] [name], female   DOB: 18-Mar-1934, 82 y.o.   MRN: [id number]    Advanced Heart Failure Clinic Note   Primary Care: Dr. Sanda Linger  Baseline proBNP: 674 on 07/30/12  HPI: Ms. [name] [name] is a 82 y.o. AAF with history of diabetes, hypothyroidism, hypertension and diagnosed mixed diastolic and systolic heart failure during hospitalization on 07/30/12, EF 20-25%.  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   She was evaluated by her PCP 12/13 for progressive dyspnea.  EKG showed LBBB therefore she was sent to the ER and placed on BiPap.  She had an elevated d-dimer but CTA chest neg for PE.  She was admitted for respiratory failure and found to have depressed LVEF of 20-25%.  She was diuresed and stay was c/b hypotension with acute renal failure requiring short coarse of dopamine.  Discharge Cr 0.77 (peaked 3.11).  Discharge weight 141 pounds.    She returns today for regular follow up. Feels ok overall, but fatigue is about the same. "tired all the time.". No SOB, orthopnea/PND, or CP. Doesn't have to stop during a grocery trip as long as she pushes the buggy. Weights at home 140-143. Up about 5 lbs since last visit. Still taking lasix about once a week. Taking all medications. Does occasionally get lightheaded with rapid standing, but not today, and it is not limiting.   Underwent Myoview 08/17/12: Overall Impression: High risk stress nuclear study. No ischemia or scar seen. Normal apical thinning is present. Severe LV systolic dysfunction. LV Ejection Fraction: 27%. LV Wall Motion: Severe global hypokinesis.   L & RHC 09/01/12 RA = 2  RV = 37/2/4  PA = 39/12 (22)  PCW = 11  Fick cardiac output/index = 3.0/1.8  Thermo CO/CI = 2.7/1.6  PVR = 4.9 Woods (Fick)  SVR = 2259  FA sat = 98%  PA sat = 57%, 59%  Ao Pressure: 135/57 (88)  LV Pressure: 136/15/20  There was no signficant gradient across the aortic valve on pullback.  Left main: Normal  LAD:  Large vessel wraps apex. Gives off multiple small diagonals. 40% lesion in mid LAD. Mild plaque in apical LAD.  LCX: Nondominant. Mild to moderate non-obstructive plaque in mid vessel. Small OM-1 which is OK. Large OM-2 with tandem 90% lesions in very proximal segment. Large OM-3 with long 70-80 lesion in proximal segment.  RCA: Dominant. Proximal and mid vessel heavily calcified with diffuse 40% lesion.  LV-gram done in the RAO projection: Ejection fraction = 25-30%. Global HK. Mild MR.  CAD treated medically as she was not having CP and LV dysfunction felt to be out of proportion to CAD.  ECHO -03/18/13  EF 20-25% with dysynchrony. Underwent implant of St Jude CRT-D 10/01/13 Echo 05/09/2014:   EF 25-30% with diffuse hypokinesis, mild LVH, and normal RV size with mildly decreased systolic function.   Labs 04/08/13 K 4.1 Creatinine 0.9 Hemoglobin A1C 12.6 Cholesterol 187 Triglycerides 362 HDL 40  Labs 4/15 LDL 70 Labs 7/15 K 3.5, creatinine 0.7 Labs 11/09/2014: K 3.8 Creatinine 0.79 Hgb A1C 13.7 TSH 32.86   Review of Systems: All pertinent positives and negatives as in HPI, otherwise negative.   Past Medical History  Diagnosis Date  . Hypothyroidism   . High cholesterol   . Osteoarthritis   . Chronic combined systolic and diastolic CHF, NYHA class 3 (HCC)     a. 03/2013 Echo: EF 25-30%, diff HK, sev antsept HK, mild  MR.  . Congestive dilated cardiomyopathy (HCC)     a. 03/2013 Echo: EF 25-30%;  b. 09/2013 s/p SJM 3242 CRT-P.  Marland Kitchen Left bundle branch block   . Type II diabetes mellitus (HCC)   . GERD (gastroesophageal reflux disease)   . Coronary artery disease     a. 08/2012 Cath: LM nl, LAD 47m, LCX nondom, mild-mod nonobs dzs mid, OM1 ok, OM2 90/90p, RCA 40, EF 25-30%-->Med Rx.    Current Outpatient Prescriptions  Medication Sig Dispense Refill  . aspirin EC 81 MG EC tablet Take 1 tablet (81 mg total) by mouth daily. 30 tablet 0  . atorvastatin (LIPITOR) 10 MG tablet Take 40 mg by mouth  daily.    . carvedilol (COREG) 12.5 MG tablet TAKE 1 TABLET TWICE A DAY WITH MEALS 180 tablet 5  . furosemide (LASIX) 20 MG tablet Take 20 mg by mouth once a week.    Marland Kitchen glipiZIDE (GLUCOTROL) 10 MG tablet TAKE 1 TABLET TWICE A DAY BEFORE MEALS 180 tablet 1  . HYDROcodone-acetaminophen (NORCO/VICODIN) 5-325 MG tablet Take 1 tablet by mouth every 4 hours as needed *DO NOT EXCEED 3 GM APAP/24 HRS* 60 tablet 0  . Insulin Glargine (LANTUS SOLOSTAR) 100 UNIT/ML Solostar Pen Inject 14 Units into the skin daily before breakfast. 15 mL 2  . Insulin Pen Needle 32G X 4 MM MISC Use 1x a day 100 each 1  . LANTUS SOLOSTAR 100 UNIT/ML Solostar Pen INJECT 30 UNITS INTO THE SKIN DAILY AT 10 P.M 15 mL 10  . levothyroxine (SYNTHROID, LEVOTHROID) 125 MCG tablet Take 1 tablet (125 mcg total) by mouth daily. 90 tablet 1  . lisinopril (PRINIVIL,ZESTRIL) 5 MG tablet Take 1 tablet (5 mg total) by mouth 2 (two) times daily. 180 tablet 3  . metFORMIN (GLUCOPHAGE-XR) 750 MG 24 hr tablet Take 1 tablet (750 mg total) by mouth 2 (two) times daily. 180 tablet 1  . omeprazole (PRILOSEC) 20 MG capsule Take 20 mg by mouth as needed.    . Vitamin D, Ergocalciferol, (DRISDOL) 50000 units CAPS capsule TAKE 1 CAPSULE EVERY 7 DAYS ON MONDAYS 12 capsule 2   No current facility-administered medications for this encounter.    Allergies  Allergen Reactions  . Statins   . Vytorin [Ezetimibe-Simvastatin] Other (See Comments)    Leg cramps   PHYSICAL EXAM: Filed Vitals:   08/28/15 1119  BP: 130/72  Pulse: 73  Weight: 143 lb 4 oz (64.978 kg)  SpO2: 98%   Wt Readings from Last 3 Encounters:  08/28/15 143 lb 4 oz (64.978 kg)  06/27/15 139 lb 12.8 oz (63.413 kg)  05/03/15 137 lb (62.143 kg)     General:  Elderly Well appearing. No respiratory difficulty  HEENT: normal Neck: supple. JVP 5-6 cm, Carotids 2+ bilat; no bruits. No thyromegaly or nodule noted. Cor: PMI nondisplaced. RRR. No M/G/R Lungs: CTAB, normal  effort Abdomen: soft, NT, ND, no HSM. No bruits or masses. +BS  Extremities: no cyanosis, clubbing, rash. No edema.   Neuro: alert & oriented x 3, cranial nerves grossly intact. moves all 4 extremities w/o difficulty. Affect pleasant.  ASSESSMENT & PLAN: 1) Chronic systolic HF:  Primarily nonischemic cardiomyopathy (EF depressed out of proportion to coronary disease).  S/p St Jude CRT-D 2/15.  Echo 01/2015 EF 25-30% (stable).  NYHA II-III. Volume status very mildly elevated by corvue but looks good on exam.  Continues to have chronic fatigue. - Continue carvedilol 12.5 mg BID. She has chronic fatigue, so we  will not increase.  - Continue lisinopril 5 mg BID. We discussed switching to [facility] and went over risks and benefits.  Pt prefers to hold off at this time.  She states she will consider it at her next visit if she continues to have ups and downs with her fluid intake.  - Continue Lasix as needed. Discussed using sliding scale for weight gain of 3 lbs overnight or 5 lbs in one week.  - Reinforced need for daily weights and reviewed use of sliding scale diuretics as above - Will repeat echo at next visit. (~2 months) 2) CAD:  - No CP.  - Continue ASA 81 mg daily and Lipitor 40 mg daily.   3) Hyperlipidemia:  - Continue statin as above. Lipids per PCP.   4) GERD:  - Stable. Continue prilosec 20 mg daily prn.      Pt refused to switch to [facility].  Had a friend who switched to it recently who passed away from decompensated HF shortly after.  We discussed Entresto and that it increases diuresis, and was likely not the cause of her friends decompensation, but she wants to hold off for now.  BMET today and f/u 2 months with Echo. Can discuss further then.   Graciella Freer PA-C  08/28/2015  Patient seen and examined with Otilio Saber, PA-C. We discussed all aspects of the encounter. I agree with the assessment and plan as stated above.   CAD is stable without angina. Stable NYHA  II-III. Volume status minimally elevated. Long talk about possibly switching to [place]. She initially agreed but then refused so will hold off for now. Recent echo reviewed and EF 25-30%. She is s/p ICD. Reinforced need for daily weights and reviewed use of sliding scale diuretics. Will see back in 2 months.  ICD interrogated personally.    Ericca Labra,MD 10:34 PM

## 2015-08-29 ENCOUNTER — Ambulatory Visit: Payer: Medicare Other | Admitting: Internal Medicine

## 2015-08-29 ENCOUNTER — Telehealth (HOSPITAL_COMMUNITY): Payer: Self-pay | Admitting: *Deleted

## 2015-08-29 DIAGNOSIS — I5022 Chronic systolic (congestive) heart failure: Secondary | ICD-10-CM

## 2015-08-29 MED ORDER — LISINOPRIL 5 MG PO TABS: 5.0000 mg | ORAL_TABLET | Freq: Two times a day (BID) | ORAL | Status: DC

## 2015-08-29 NOTE — Telephone Encounter (Signed)
Pt had decided not to start Entresto, she would like to continue her Lisinopril, rx sent to [facility]

## 2015-09-04 ENCOUNTER — Ambulatory Visit (INDEPENDENT_AMBULATORY_CARE_PROVIDER_SITE_OTHER): Payer: Medicare Other | Admitting: *Deleted

## 2015-09-04 DIAGNOSIS — Z950 Presence of cardiac pacemaker: Secondary | ICD-10-CM | POA: Diagnosis not present

## 2015-09-04 DIAGNOSIS — I5022 Chronic systolic (congestive) heart failure: Secondary | ICD-10-CM | POA: Diagnosis not present

## 2015-09-05 DIAGNOSIS — E113593 Type 2 diabetes mellitus with proliferative diabetic retinopathy without macular edema, bilateral: Secondary | ICD-10-CM | POA: Diagnosis not present

## 2015-09-05 NOTE — Progress Notes (Signed)
Remote pacemaker transmission.   

## 2015-09-07 LAB — CUP PACEART REMOTE DEVICE CHECK
Brady Statistic AP VS Percent: 1 %
Brady Statistic AS VP Percent: 86 %
Brady Statistic AS VS Percent: 1 %
Date Time Interrogation Session: 20170123070011
Implantable Lead Implant Date: 20150220
Implantable Lead Implant Date: 20150220
Implantable Lead Location: 753858
Implantable Lead Location: 753860
Lead Channel Impedance Value: 390 Ohm
Lead Channel Impedance Value: 430 Ohm
Lead Channel Impedance Value: 990 Ohm
Lead Channel Pacing Threshold Pulse Width: 0.4 ms
Lead Channel Sensing Intrinsic Amplitude: 2.2 mV
Lead Channel Setting Pacing Amplitude: 1.75 V
Lead Channel Setting Pacing Amplitude: 2 V
Lead Channel Setting Pacing Pulse Width: 1 ms
MDC IDC LEAD IMPLANT DT: 20150220
MDC IDC LEAD LOCATION: 753859
MDC IDC MSMT BATTERY REMAINING LONGEVITY: 95 mo
MDC IDC MSMT BATTERY REMAINING PERCENTAGE: 95.5 %
MDC IDC MSMT BATTERY VOLTAGE: 2.99 V
MDC IDC MSMT LEADCHNL RA PACING THRESHOLD AMPLITUDE: 0.75 V
MDC IDC MSMT LEADCHNL RA PACING THRESHOLD PULSEWIDTH: 0.4 ms
MDC IDC MSMT LEADCHNL RV PACING THRESHOLD AMPLITUDE: 1.375 V
MDC IDC SET LEADCHNL RV PACING AMPLITUDE: 2.375
MDC IDC SET LEADCHNL RV PACING PULSEWIDTH: 0.4 ms
MDC IDC SET LEADCHNL RV SENSING SENSITIVITY: 2 mV
MDC IDC STAT BRADY AP VP PERCENT: 13 %
MDC IDC STAT BRADY RA PERCENT PACED: 13 %
Pulse Gen Model: 3242
Pulse Gen Serial Number: 7578583

## 2015-09-11 ENCOUNTER — Encounter: Payer: Self-pay | Admitting: Internal Medicine

## 2015-09-13 ENCOUNTER — Encounter: Payer: Self-pay | Admitting: Cardiology

## 2015-09-27 ENCOUNTER — Encounter: Payer: Self-pay | Admitting: Cardiology

## 2015-10-02 LAB — HM DIABETES EYE EXAM

## 2015-10-06 ENCOUNTER — Other Ambulatory Visit: Payer: Self-pay | Admitting: *Deleted

## 2015-10-06 ENCOUNTER — Encounter: Payer: Self-pay | Admitting: Internal Medicine

## 2015-10-06 ENCOUNTER — Ambulatory Visit (INDEPENDENT_AMBULATORY_CARE_PROVIDER_SITE_OTHER): Payer: Medicare Other | Admitting: Internal Medicine

## 2015-10-06 ENCOUNTER — Telehealth: Payer: Self-pay | Admitting: Internal Medicine

## 2015-10-06 VITALS — BP 118/68 | HR 72 | Temp 98.5°F | Resp 12 | Wt 142.0 lb

## 2015-10-06 DIAGNOSIS — E1140 Type 2 diabetes mellitus with diabetic neuropathy, unspecified: Secondary | ICD-10-CM | POA: Diagnosis not present

## 2015-10-06 DIAGNOSIS — I2510 Atherosclerotic heart disease of native coronary artery without angina pectoris: Secondary | ICD-10-CM | POA: Diagnosis not present

## 2015-10-06 DIAGNOSIS — E1149 Type 2 diabetes mellitus with other diabetic neurological complication: Secondary | ICD-10-CM

## 2015-10-06 DIAGNOSIS — E038 Other specified hypothyroidism: Secondary | ICD-10-CM

## 2015-10-06 DIAGNOSIS — Z794 Long term (current) use of insulin: Secondary | ICD-10-CM | POA: Diagnosis not present

## 2015-10-06 LAB — TSH: TSH: 5.2 u[IU]/mL — ABNORMAL HIGH (ref 0.35–4.50)

## 2015-10-06 LAB — T4, FREE: Free T4: 0.95 ng/dL (ref 0.60–1.60)

## 2015-10-06 MED ORDER — METFORMIN HCL ER 750 MG PO TB24: 750.0000 mg | ORAL_TABLET | Freq: Two times a day (BID) | ORAL | Status: DC

## 2015-10-06 MED ORDER — LEVOTHYROXINE SODIUM 137 MCG PO TABS: 137.0000 ug | ORAL_TABLET | Freq: Every day | ORAL | Status: DC

## 2015-10-06 MED ORDER — GLUCOSE BLOOD VI STRP: ORAL_STRIP | Status: DC

## 2015-10-06 MED ORDER — GLIPIZIDE 10 MG PO TABS: ORAL_TABLET | ORAL | Status: DC

## 2015-10-06 NOTE — Patient Instructions (Addendum)
Please continue: - Glipizide 10 mg 2x a day - Metformin ER 750 mg 2x a day  - Lantus 14 units in am  Please let me know if the sugars are consistently <80 or >200.  Please return in 3 months with your sugar log.   Please stop at the lab.

## 2015-10-06 NOTE — Progress Notes (Signed)
Patient ID: [name] [name], female   DOB: [birth year], 82 y.o.   MRN: [id number]  HPI: [name] [name] is a 82 y.o.-year-old female, returning for f/u for DM2, dx in ~2006, insulin-dependent, uncontrolled, with complications (peripheral neuropathy, CAD). Last visit 3 mo ago.  DM2: Last hemoglobin A1c was: Lab Results  Component Value Date   HGBA1C 11.5 05/03/2015   HGBA1C 13.7* 11/09/2014   HGBA1C 11.7* 03/10/2014  She continues to get steroid inj's.  Pt is on a regimen of: - Metformin ER 750 mg 2x a day  - Glipizide 10 mg 2x a day - Lantus 14 units in am added 04/2015 - in am - may have taken it 3x a week before last visit! Started taking it consistently  Pt is checks her sugars 1x a day (not in last week): - am: 230-260 >> 130, 170-200s >> 89-240 >> 90-147 - 2h after brunch: n/c >> 123, 189, 199 >> n/c - before lunch: n/c - 2h after lunch: n/c >> 226 >> n/c - before dinner: n/c - 2h after dinner: n/c - bedtime: n/c   - nighttime: n/c >> 120-150 >>  No lows. Lowest sugar was 140 >> 120 >> 89. ? If she had hypoglycemia awareness.  Highest sugar was 300s >> 240.  Glucometer: AccuChek  Pt's meals are: - Breakfast: cold or hot cereal - Lunch: small sandwich - Dinner: vegetables + meat + bread - Snacks: 1/2 banana  - no CKD, last BUN/creatinine:  Lab Results  Component Value Date   BUN 19 08/28/2015   CREATININE 0.92 08/28/2015   - last set of lipids: Lab Results  Component Value Date   CHOL 240* 11/09/2014   HDL 38.90* 11/09/2014   LDLCALC 70 12/06/2013   LDLDIRECT 140.0 11/09/2014   TRIG 251.0* 11/09/2014   CHOLHDL 6 11/09/2014   - last eye exam was in 10/02/2015. No DR. + cataract OU >> implants. - + numbness and tingling in her feet. Has neuropathy. She is on Hydrocodone for neuropathic pain. This is the only thing that helps. Tried Lyrica and Neurontin >> did not help.  She also has hypothyroidism: I noticed that her latest TSH was very high - noncompliance with  LT4:  Lab Results  Component Value Date   TSH 5.60* 06/27/2015   TSH 9.08* 05/03/2015   TSH 32.86* 11/09/2014   TSH 15.92* 03/10/2014   TSH 4.28 04/08/2013   TSH 5.75* 08/27/2012   FREET4 1.04 06/27/2015   FREET4 1.13 05/03/2015   She is taking LT4 125 mcg: - every day - forgets this once a mo - fasting.  - + prn PPI at night - no calcium - no iron - on MVI later in the day  ROS: Constitutional: no weight gain/loss, no fatigue, no subjective hyperthermia/hypothermia Eyes: no blurry vision, no xerophthalmia ENT: no sore throat, no nodules palpated in throat, no dysphagia/odynophagia, no hoarseness Cardiovascular: no CP/SOB/palpitations/leg swelling Respiratory: no cough/SOB Gastrointestinal: no N/V/D/C/+ heartburn Musculoskeletal: no muscle/+ joint aches Skin: no rashes Neurological: + tremors/no numbness/tingling/dizziness  I reviewed pt's medications, allergies, PMH, social hx, family hx, and changes were documented in the history of present illness. Otherwise, unchanged from my initial visit note.  Past Medical History  Diagnosis Date  . Hypothyroidism   . High cholesterol   . Osteoarthritis   . Chronic combined systolic and diastolic CHF, NYHA class 3 (HCC)     a. 03/2013 Echo: EF 25-30%, diff HK, sev antsept HK, mild MR.  . Congestive dilated cardiomyopathy (HCC)  a. 03/2013 Echo: EF 25-30%;  b. 09/2013 s/p SJM 3242 CRT-P.  Marland Kitchen Left bundle branch block   . Type II diabetes mellitus (HCC)   . GERD (gastroesophageal reflux disease)   . Coronary artery disease     a. 08/2012 Cath: LM nl, LAD 52m, LCX nondom, mild-mod nonobs dzs mid, OM1 ok, OM2 90/90p, RCA 40, EF 25-30%-->Med Rx.   Past Surgical History  Procedure Laterality Date  . Abdominal hysterectomy  1980  . Bmd  2006  . Bilateral cateract surgery    . Bi-ventricular pacemaker insertion (crt-p)      a. 09/2013 s/p SJM 3242 CRT-P.  . Orif tibia plateau Right 12/04/2013    Procedure: OPEN REDUCTION INTERNAL  FIXATION (ORIF) TIBIAL PLATEAU;  Surgeon: Verlee Rossetti, MD;  Location: [facility] ORS;  Service: Orthopedics;  Laterality: Right;  . Right heart catheterization N/A 09/08/2012    Procedure: RIGHT HEART CATH;  Surgeon: Dolores Patty, MD;  Location: [facility] CATH LAB;  Service: Cardiovascular;  Laterality: N/A;  . Bi-ventricular pacemaker insertion N/A 10/01/2013    Procedure: BI-VENTRICULAR PACEMAKER INSERTION (CRT-P);  Surgeon: Duke Salvia, MD;  Location: [facility] C[facility] LAB;  Service: Cardiovascular;  Laterality: N/A;   Social History   Social History  . Marital Status: Divorced    Spouse Name: N/A  . Number of Children: 4  . Years of Education: N/A   Occupational History  . Retired Engineer, civil (consulting)    Social History Main Topics  . Smoking status: Former Games developer  . Smokeless tobacco: Never Used  . Alcohol Use: No  . Drug Use: No  . Sexual Activity: Not Currently   Other Topics Concern  . Not on file   Social History Narrative   Widowed.  Lives with her son.  Normally able to ambulate without assistance.  Quit smoking as a teenager.              Current Outpatient Prescriptions on File Prior to Visit  Medication Sig Dispense Refill  . aspirin EC 81 MG EC tablet Take 1 tablet (81 mg total) by mouth daily. 30 tablet 0  . atorvastatin (LIPITOR) 10 MG tablet Take 40 mg by mouth daily.    . carvedilol (COREG) 12.5 MG tablet TAKE 1 TABLET TWICE A DAY WITH MEALS 180 tablet 5  . furosemide (LASIX) 20 MG tablet Take 20 mg by mouth once a week.    Marland Kitchen glipiZIDE (GLUCOTROL) 10 MG tablet TAKE 1 TABLET TWICE A DAY BEFORE MEALS 180 tablet 1  . HYDROcodone-acetaminophen (NORCO/VICODIN) 5-325 MG tablet Take 1 tablet by mouth every 4 hours as needed *DO NOT EXCEED 3 GM APAP/24 HRS* 60 tablet 0  . Insulin Glargine (LANTUS SOLOSTAR) 100 UNIT/ML Solostar Pen Inject 14 Units into the skin daily before breakfast. 15 mL 2  . Insulin Pen Needle 32G X 4 MM MISC Use 1x a day 100 each 1  . LANTUS SOLOSTAR 100 UNIT/ML Solostar  Pen INJECT 30 UNITS INTO THE SKIN DAILY AT 10 P.M 15 mL 10  . levothyroxine (SYNTHROID, LEVOTHROID) 125 MCG tablet Take 1 tablet (125 mcg total) by mouth daily. 90 tablet 1  . lisinopril (PRINIVIL,ZESTRIL) 5 MG tablet Take 1 tablet (5 mg total) by mouth 2 (two) times daily. 180 tablet 3  . metFORMIN (GLUCOPHAGE-XR) 750 MG 24 hr tablet Take 1 tablet (750 mg total) by mouth 2 (two) times daily. 180 tablet 1  . omeprazole (PRILOSEC) 20 MG capsule Take 20 mg by mouth as needed.    Marland Kitchen  Vitamin D, Ergocalciferol, (DRISDOL) 50000 units CAPS capsule TAKE 1 CAPSULE EVERY 7 DAYS ON MONDAYS 12 capsule 2   No current facility-administered medications on file prior to visit.   Allergies  Allergen Reactions  . Statins   . Vytorin [Ezetimibe-Simvastatin] Other (See Comments)    Leg cramps   Family History  Problem Relation Age of Onset  . Diabetes Other   . Hypertension Other   . Uterine cancer Other   . Diabetes Mother   . Heart attack Brother    PE: BP 118/68 mmHg  Pulse 72  Temp(Src) 98.5 F (36.9 C) (Oral)  Resp 12  Wt 142 lb (64.411 kg)  SpO2 98% Body mass index is 25.16 kg/(m^2). Wt Readings from Last 3 Encounters:  10/06/15 142 lb (64.411 kg)  08/28/15 143 lb 4 oz (64.978 kg)  06/27/15 139 lb 12.8 oz (63.413 kg)   Constitutional: normal weight, in NAD Eyes: PERRLA, EOMI, no exophthalmos ENT: moist mucous membranes, no thyromegaly, no cervical lymphadenopathy Cardiovascular: RRR, No MRG Respiratory: CTA B Gastrointestinal: abdomen soft, NT, ND, BS+ Musculoskeletal: no deformities, strength intact in all 4 Skin: moist, warm, no rashes Neurological: + Mild tremor with outstretched hands, DTR normal in all 4  ASSESSMENT: 1. DM2, insulin-dependent, uncontrolled, with complications - Peripheral neuropathy - CAD  2. Hypothyroidism   PLAN:  1. Patient with long-standing, very uncontrolled diabetes, on oral antidiabetic regimen, now also on basal insulin added at last visit. Sugars  are better, but she is only checking in am >> advised to check sugars throughout the day, rotating checks. - I suggested to:  Patient Instructions  Please continue: - Glipizide 10 mg 2x a day - Metformin ER 750 mg 2x a day  - Lantus 14 units in am  Please let me know if the sugars are consistently <80 or >200.  Please return in 3 months with your sugar log.   Please stop at the lab.  - Strongly advised her to start checking sugars 2 times a day, rotating checks - given more sugar logs - advised for yearly eye exams >> she is UTD - checked HbA1c: 10.7% (better, but still high). The a.m. Sugars are close to goal, therefore, I would not make any changes in her regimen right now, although I would have expected the hemoglobin A1c to be better... Again advised her to check sugars later in the day to identify problem areas. - Return to clinic in 3 mo with sugar log   2. Hypothyroidism - now compliant with her daily levothyroxine dose  - Again advised her to take the thyroid hormone every day, with water, >30 minutes before breakfast, separated by >4 hours from acid reflux medications, calcium, iron, multivitamins. She takes it correctly. - We will recheck her thyroid tests now as the last TSH was still slightly high 3 mo ago - Continue levothyroxine 125 g daily for now   Component     Latest Ref Rng 10/06/2015  TSH     0.35 - 4.50 uIU/mL 5.20 (H)  T4,Free(Direct)     0.60 - 1.60 ng/dL 1.61   TSH still a little high >> will increase LT4 to 137 mcg and repeat TFTs at next visit.

## 2015-10-06 NOTE — Telephone Encounter (Signed)
Pt called to tell you her accu check is the Avivia and the strips Avivia Plus

## 2015-10-06 NOTE — Telephone Encounter (Signed)
Test strips sent in. 

## 2015-10-09 ENCOUNTER — Telehealth: Payer: Self-pay | Admitting: Internal Medicine

## 2015-10-09 NOTE — Telephone Encounter (Signed)
The metformin and glipizide and test strips for accucheck [facility] but the pharmacy is stating this needs to be ok'd by the MD

## 2015-10-10 MED ORDER — GLUCOSE BLOOD VI STRP
ORAL_STRIP | Status: DC
Start: 2015-10-10 — End: 2016-01-23

## 2015-10-10 MED ORDER — ONETOUCH DELICA LANCETS FINE MISC: Status: DC

## 2015-10-10 MED ORDER — ONETOUCH VERIO FLEX SYSTEM W/DEVICE KIT: 1.0000 | PACK | Freq: Every day | Status: DC

## 2015-10-10 NOTE — Telephone Encounter (Signed)
Metformin and glipizide have been [facility].

## 2015-10-10 NOTE — Addendum Note (Signed)
Addended by: Adline Mango B on: 10/10/2015 01:48 PM   Modules accepted: Orders, Medications

## 2015-10-10 NOTE — Telephone Encounter (Signed)
Sent One Touch Verio Flex meter, verio strips and delica lancets to [facility] for pt.

## 2015-10-11 ENCOUNTER — Other Ambulatory Visit: Payer: Self-pay | Admitting: *Deleted

## 2015-10-11 MED ORDER — LEVOTHYROXINE SODIUM 137 MCG PO TABS: 137.0000 ug | ORAL_TABLET | Freq: Every day | ORAL | Status: DC

## 2015-10-11 NOTE — Telephone Encounter (Signed)
[facility] sent a refill.

## 2015-12-27 ENCOUNTER — Encounter: Payer: Self-pay | Admitting: Internal Medicine

## 2015-12-27 ENCOUNTER — Ambulatory Visit (INDEPENDENT_AMBULATORY_CARE_PROVIDER_SITE_OTHER): Payer: Medicare Other | Admitting: Internal Medicine

## 2015-12-27 VITALS — BP 146/76 | HR 70 | Ht 64.0 in | Wt 137.8 lb

## 2015-12-27 DIAGNOSIS — I429 Cardiomyopathy, unspecified: Secondary | ICD-10-CM

## 2015-12-27 DIAGNOSIS — I428 Other cardiomyopathies: Secondary | ICD-10-CM

## 2015-12-27 DIAGNOSIS — I2510 Atherosclerotic heart disease of native coronary artery without angina pectoris: Secondary | ICD-10-CM

## 2015-12-27 DIAGNOSIS — Z950 Presence of cardiac pacemaker: Secondary | ICD-10-CM

## 2015-12-27 DIAGNOSIS — I5022 Chronic systolic (congestive) heart failure: Secondary | ICD-10-CM | POA: Diagnosis not present

## 2015-12-27 LAB — CUP PACEART INCLINIC DEVICE CHECK
Battery Remaining Longevity: 92.4
Battery Voltage: 2.99 V
Brady Statistic RV Percent Paced: 99.79 %
Date Time Interrogation Session: 20170517153032
Implantable Lead Implant Date: 20150220
Implantable Lead Implant Date: 20150220
Implantable Lead Location: 753858
Implantable Lead Location: 753860
Lead Channel Impedance Value: 400 Ohm
Lead Channel Pacing Threshold Amplitude: 0.75 V
Lead Channel Pacing Threshold Amplitude: 1.25 V
Lead Channel Pacing Threshold Amplitude: 1.25 V
Lead Channel Pacing Threshold Pulse Width: 0.4 ms
Lead Channel Pacing Threshold Pulse Width: 1 ms
Lead Channel Sensing Intrinsic Amplitude: 3.1 mV
Lead Channel Setting Pacing Amplitude: 2 V
Lead Channel Setting Pacing Amplitude: 2.25 V
Lead Channel Setting Pacing Pulse Width: 0.4 ms
MDC IDC LEAD IMPLANT DT: 20150220
MDC IDC LEAD LOCATION: 753859
MDC IDC MSMT LEADCHNL LV IMPEDANCE VALUE: 950 Ohm
MDC IDC MSMT LEADCHNL RA PACING THRESHOLD PULSEWIDTH: 0.4 ms
MDC IDC MSMT LEADCHNL RV IMPEDANCE VALUE: 425 Ohm
MDC IDC MSMT LEADCHNL RV SENSING INTR AMPL: 12 mV
MDC IDC PG MODEL: 3242
MDC IDC PG SERIAL: 7578583
MDC IDC SET LEADCHNL LV PACING PULSEWIDTH: 1 ms
MDC IDC SET LEADCHNL RA PACING AMPLITUDE: 1.75 V
MDC IDC SET LEADCHNL RV SENSING SENSITIVITY: 2 mV
MDC IDC STAT BRADY RA PERCENT PACED: 14 %

## 2015-12-27 NOTE — Progress Notes (Signed)
Patient Care Team: Janith Lima, MD as PCP - General   HPI  [name] [name] is a 82 y.o. female Seen in followup for congestive heart failure status post recent CRT implantation (2/15) she has nonischemic cardiomyopathy.  There had been interval significant improvement in congestive symptoms following CRT implantation  while she is having less dyspnea she continues with fatigue with exertion i.e. climbing stairs;   She has no complaints of dyspnea. Her biggest complaint is of dizziness which is occurring in the evening after supper. It occurs with standing. It does not occur earlier in the day She started taking Centrum vitamins about 3 days ago; this occurred in the context.  Reviewing her medication she takes her carvedilol and lisinopril second dose at midday   Past Medical History  Diagnosis Date  . Hypothyroidism   . High cholesterol   . Osteoarthritis   . Chronic combined systolic and diastolic CHF, NYHA class 3 ([place])     a. 03/2013 Echo: EF 25-30%, diff HK, sev antsept HK, mild MR.  . Congestive dilated cardiomyopathy ([place])     a. 03/2013 Echo: EF 25-30%;  b. 09/2013 s/p SJM 3242 CRT-P.  Marland Kitchen Left bundle branch block   . Type II diabetes mellitus ([place])   . GERD (gastroesophageal reflux disease)   . Coronary artery disease     a. 08/2012 Cath: LM nl, LAD 24m LCX nondom, mild-mod nonobs dzs mid, OM1 ok, OM2 90/90p, RCA 40, EF 25-30%-->Med Rx.    Past Surgical History  Procedure Laterality Date  . Abdominal hysterectomy  1980  . Bmd  2006  . Bilateral cateract surgery    . Bi-ventricular pacemaker insertion (crt-p)      a. 09/2013 s/p SJM 3242 CRT-P.  . Orif tibia plateau Right 12/04/2013    Procedure: OPEN REDUCTION INTERNAL FIXATION (ORIF) TIBIAL PLATEAU;  Surgeon: SAugustin Schooling MD;  Location: [facility];  Service: Orthopedics;  Laterality: Right;  . Right heart catheterization N/A 09/08/2012    Procedure: RIGHT HEART CATH;  Surgeon: DJolaine Artist MD;  [facility];  Service: Cardiovascular;  Laterality: N/A;  . Bi-ventricular pacemaker insertion N/A 10/01/2013    Procedure: BI-VENTRICULAR PACEMAKER INSERTION (CRT-P);  Surgeon: SDeboraha Sprang MD;  [facility];  Service: Cardiovascular;  Laterality: N/A;    Current Outpatient Prescriptions  Medication Sig Dispense Refill  . aspirin EC 81 MG EC tablet Take 1 tablet (81 mg total) by mouth daily. 30 tablet 0  . atorvastatin (LIPITOR) 10 MG tablet Take 40 mg by mouth daily.    . Blood Glucose Monitoring Suppl ([place] w/Device KIT 1 each by Does not apply route daily. Dx: E11.40 1 kit 0  . carvedilol (COREG) 12.5 MG tablet TAKE 1 TABLET TWICE A DAY WITH MEALS 180 tablet 5  . furosemide (LASIX) 20 MG tablet Take 20 mg by mouth once a week.    .Marland KitchenglipiZIDE (GLUCOTROL) 10 MG tablet TAKE 1 TABLET TWICE A DAY BEFORE MEALS 180 tablet 1  . glucose blood (ONETOUCH VERIO) test strip Use to test blood sugar once daily as instructed. Dx: E11.40 100 each 3  . HYDROcodone-acetaminophen (NORCO/VICODIN) 5-325 MG tablet Take 1 tablet by mouth every 4 hours as needed *DO NOT EXCEED 3 GM APAP/24 HRS* 60 tablet 0  . Insulin Glargine (LANTUS SOLOSTAR) 100 UNIT/ML Solostar Pen Inject 14 Units into the skin daily before breakfast. 15 mL 2  . Insulin Pen  Needle 32G X 4 MM MISC Use 1x a day 100 each 1  . LANTUS SOLOSTAR 100 UNIT/ML Solostar Pen INJECT 30 UNITS INTO THE SKIN DAILY AT 10 P.M 15 mL 10  . levothyroxine (SYNTHROID, LEVOTHROID) 137 MCG tablet Take 1 tablet (137 mcg total) by mouth daily before breakfast. 90 tablet 1  . lisinopril (PRINIVIL,ZESTRIL) 5 MG tablet Take 1 tablet (5 mg total) by mouth 2 (two) times daily. 180 tablet 3  . metFORMIN (GLUCOPHAGE-XR) 750 MG 24 hr tablet Take 1 tablet (750 mg total) by mouth 2 (two) times daily. 180 tablet 1  . omeprazole (PRILOSEC) 20 MG capsule Take 20 mg by mouth as needed.    Glory Rosebush DELICA LANCETS FINE MISC Use to test blood sugar 1  time daily. Dx: E11.40 100 each 3  . Vitamin D, Ergocalciferol, (DRISDOL) 50000 units CAPS capsule TAKE 1 CAPSULE EVERY 7 DAYS ON MONDAYS 12 capsule 2   No current facility-administered medications for this visit.    Allergies  Allergen Reactions  . Statins   . Vytorin [Ezetimibe-Simvastatin] Other (See Comments)    Leg cramps    Review of Systems negative except from HPI and PMH  Physical Exam BP 146/76 mmHg  Pulse 70  Ht _0  (1.626 m)  Wt 137 lb 12.8 oz (62.506 kg)  BMI 23.64 kg/m2 Well developed and well nourished in no acute distress   HENT normal E scleral and icterus clear Neck Supple JVP flat; carotids brisk and full Clear to ausculation Device pocket well healed; without hematoma or erythema.  There is no tetheringRegular rate and rhythm, no murmurs gallops or rub Soft with active bowel sounds No clubbing cyanosis   she is sitting in a wheelchair  no Edema Alert and oriented, grossly normal motor and sensory function Skin Warm and Dry  Chest x-ray from 2/15 was reviewed. The lead is relatively basilar. It is noted that she paces from 3 and 4 off of her lead.  ECG demonstrates P synchronous pacing with a QRS duration of 135 down from the ECG that was preoperative with a QRS duration of 158   QRS neg V1    Assessment and  Plan'  Nonischemic cardiomyopathy  Congestive heart failure-chronic systolic   dizziness  CRT P.-[employer] Jude  The patient's device was interrogated.  The information was reviewed.   She has been taking her twice a day medications morning and lunch. There may be a post prandial component to her dizziness as well. We will have her take her second dose of her lisinopril and her carvedilol at Nighttime  Euvolemic continue current meds

## 2015-12-27 NOTE — Patient Instructions (Addendum)
Medication Instructions: Your physician has recommended you make the following change in your medication:   Lisinopril 5 mg - Take second dose at bedtime.   Labwork: None  Testing/Procedures: None  Follow-Up: - Remote monitoring is used to monitor your Pacemaker of ICD from home. This monitoring reduces the number of office visits required to check your device to one time per year. It allows [place] to keep an eye on the functioning of your device to ensure it is working properly. You are scheduled for a device check from home on 03/27/16. You may send your transmission at any time that day. If you have a wireless device, the transmission will be sent automatically. After your physician reviews your transmission, you will receive a postcard with your next transmission date.  -Your physician wants you to follow-up in: 1 year with Dr. Graciela Husbands. You will receive a reminder letter in the mail two months in advance. If you don't receive a letter, please call our office to schedule the follow-up appointment.    If you need a refill on your cardiac medications before your next appointment, please call your pharmacy.

## 2015-12-31 ENCOUNTER — Emergency Department (HOSPITAL_COMMUNITY)
Admission: EM | Admit: 2015-12-31 | Discharge: 2016-01-01 | Disposition: A | Discharge status: Home / Self Care | Payer: Medicare Other | Attending: Emergency Medicine | Admitting: Emergency Medicine

## 2015-12-31 ENCOUNTER — Encounter (HOSPITAL_COMMUNITY): Payer: Self-pay | Admitting: Emergency Medicine

## 2015-12-31 DIAGNOSIS — E1165 Type 2 diabetes mellitus with hyperglycemia: Secondary | ICD-10-CM | POA: Diagnosis not present

## 2015-12-31 DIAGNOSIS — Z7984 Long term (current) use of oral hypoglycemic drugs: Secondary | ICD-10-CM | POA: Diagnosis not present

## 2015-12-31 DIAGNOSIS — Z794 Long term (current) use of insulin: Secondary | ICD-10-CM | POA: Insufficient documentation

## 2015-12-31 DIAGNOSIS — E039 Hypothyroidism, unspecified: Secondary | ICD-10-CM | POA: Diagnosis not present

## 2015-12-31 DIAGNOSIS — Z7982 Long term (current) use of aspirin: Secondary | ICD-10-CM | POA: Insufficient documentation

## 2015-12-31 DIAGNOSIS — I2510 Atherosclerotic heart disease of native coronary artery without angina pectoris: Secondary | ICD-10-CM | POA: Diagnosis not present

## 2015-12-31 DIAGNOSIS — R739 Hyperglycemia, unspecified: Secondary | ICD-10-CM

## 2015-12-31 DIAGNOSIS — Z79899 Other long term (current) drug therapy: Secondary | ICD-10-CM | POA: Insufficient documentation

## 2015-12-31 DIAGNOSIS — E7800 Pure hypercholesterolemia, unspecified: Secondary | ICD-10-CM | POA: Insufficient documentation

## 2015-12-31 DIAGNOSIS — Z950 Presence of cardiac pacemaker: Secondary | ICD-10-CM | POA: Insufficient documentation

## 2015-12-31 DIAGNOSIS — Z79891 Long term (current) use of opiate analgesic: Secondary | ICD-10-CM | POA: Diagnosis not present

## 2015-12-31 DIAGNOSIS — M1990 Unspecified osteoarthritis, unspecified site: Secondary | ICD-10-CM | POA: Insufficient documentation

## 2015-12-31 DIAGNOSIS — R42 Dizziness and giddiness: Secondary | ICD-10-CM | POA: Diagnosis not present

## 2015-12-31 DIAGNOSIS — R7309 Other abnormal glucose: Secondary | ICD-10-CM | POA: Diagnosis not present

## 2015-12-31 LAB — CBC WITH DIFFERENTIAL/PLATELET
Basophils Absolute: 0 10*3/uL (ref 0.0–0.1)
Basophils Relative: 1 %
Eosinophils Absolute: 0.1 10*3/uL (ref 0.0–0.7)
Eosinophils Relative: 1 %
HCT: 38.2 % (ref 36.0–46.0)
Hemoglobin: 12.7 g/dL (ref 12.0–15.0)
Lymphocytes Relative: 46 %
Lymphs Abs: 3 10*3/uL (ref 0.7–4.0)
MCH: 27 pg (ref 26.0–34.0)
MCHC: 33.2 g/dL (ref 30.0–36.0)
MCV: 81.3 fL (ref 78.0–100.0)
Monocytes Absolute: 0.3 10*3/uL (ref 0.1–1.0)
Monocytes Relative: 4 %
Neutro Abs: 3.2 10*3/uL (ref 1.7–7.7)
Neutrophils Relative %: 48 %
Platelets: 253 10*3/uL (ref 150–400)
RBC: 4.7 MIL/uL (ref 3.87–5.11)
RDW: 14.8 % (ref 11.5–15.5)
WBC: 6.5 10*3/uL (ref 4.0–10.5)

## 2015-12-31 LAB — CBG MONITORING, ED: GLUCOSE-CAPILLARY: 310 mg/dL — AB (ref 65–99)

## 2015-12-31 LAB — COMPREHENSIVE METABOLIC PANEL
ALBUMIN: 4.2 g/dL (ref 3.5–5.0)
ALK PHOS: 62 U/L (ref 38–126)
ALT: 14 U/L (ref 14–54)
ANION GAP: 10 (ref 5–15)
AST: 20 U/L (ref 15–41)
BUN: 27 mg/dL — ABNORMAL HIGH (ref 6–20)
CO2: 21 mmol/L — AB (ref 22–32)
Calcium: 9.5 mg/dL (ref 8.9–10.3)
Chloride: 104 mmol/L (ref 101–111)
Creatinine, Ser: 1.58 mg/dL — ABNORMAL HIGH (ref 0.44–1.00)
GFR calc Af Amer: 35 mL/min — ABNORMAL LOW (ref >60)
GFR calc non Af Amer: 30 mL/min — ABNORMAL LOW (ref >60)
GLUCOSE: 324 mg/dL — AB (ref 65–99)
POTASSIUM: 3.9 mmol/L (ref 3.5–5.1)
SODIUM: 135 mmol/L (ref 135–145)
Total Bilirubin: 0.6 mg/dL (ref 0.3–1.2)
Total Protein: 8.3 g/dL — ABNORMAL HIGH (ref 6.5–8.1)

## 2015-12-31 LAB — TROPONIN I: Troponin I: <0.03 ng/mL

## 2015-12-31 MED ORDER — SODIUM CHLORIDE 0.9 % IV BOLUS (SEPSIS)
500.0000 mL | Freq: Once | INTRAVENOUS | Status: AC
  Administered 2015-12-31: 500 mL via INTRAVENOUS

## 2015-12-31 NOTE — ED Notes (Signed)
Bed: WA17 Expected date:  Expected time:  Means of arrival:  Comments: 82 yo F  Hyperglycemia

## 2015-12-31 NOTE — ED Notes (Signed)
Pt brought in by EMS  Pt complains that she has not felt right today  Pt did not take her insulin this morning  CBG of 390

## 2015-12-31 NOTE — ED Provider Notes (Signed)
CSN: [id number]     Arrival date & time 12/31/15  2116 History   By signing my name below, I, Forrestine Him, attest that this documentation has been prepared under the direction and in the presence of Cartez Mogle PA-C.  Electronically Signed: Forrestine Him, ED Scribe. 12/31/2015. 10:49 PM.   Chief Complaint  Patient presents with  . Hyperglycemia   The history is provided by the patient and a relative. No language interpreter was used.    HPI Comments: Melissa Tomaselli is a 82 y.o. female with a PMHx of high cholesterol,, osteoporosis, type 2 diabetes, and CAD who presents to the Emergency Department here for hyperglycemia this evening. Pt reports constant, ongoing generalized weakness and nausea x 1 day. Family states they typically administer her insulin each morning which is prescribed for once daily. However, at times, she does administer her own insulin. Son states he feels she may forget to take her insulin  but is unsure. Family states pt had a dizzy spell when attempting to get out of the car 4 days ago. Son then took her blood sugar which read 400. She denies any recent fever, chills, dysuria, polyuria, chest pain, shortness of breath, or abdominal pain.  PCP: Scarlette Calico, MD    Past Medical History  Diagnosis Date  . Hypothyroidism   . High cholesterol   . Osteoarthritis   . Chronic combined systolic and diastolic CHF, NYHA class 3 ([place])     a. 03/2013 Echo: EF 25-30%, diff HK, sev antsept HK, mild MR.  . Congestive dilated cardiomyopathy ([place])     a. 03/2013 Echo: EF 25-30%;  b. 09/2013 s/p SJM 3242 CRT-P.  Marland Kitchen Left bundle branch block   . Type II diabetes mellitus ([place])   . GERD (gastroesophageal reflux disease)   . Coronary artery disease     a. 08/2012 Cath: LM nl, LAD 72m LCX nondom, mild-mod nonobs dzs mid, OM1 ok, OM2 90/90p, RCA 40, EF 25-30%-->Med Rx.   Past Surgical History  Procedure Laterality Date  . Abdominal hysterectomy  1980  . Bmd  2006  . Bilateral cateract  surgery    . Bi-ventricular pacemaker insertion (crt-p)      a. 09/2013 s/p SJM 3242 CRT-P.  . Orif tibia plateau Right 12/04/2013    Procedure: OPEN REDUCTION INTERNAL FIXATION (ORIF) TIBIAL PLATEAU;  Surgeon: SAugustin Schooling MD;  [facility];  Service: Orthopedics;  Laterality: Right;  . Right heart catheterization N/A 09/08/2012    Procedure: RIGHT HEART CATH;  Surgeon: DJolaine Artist MD;  [facility];  Service: Cardiovascular;  Laterality: N/A;  . Bi-ventricular pacemaker insertion N/A 10/01/2013    Procedure: BI-VENTRICULAR PACEMAKER INSERTION (CRT-P);  Surgeon: SDeboraha Sprang MD;  [facility];  Service: Cardiovascular;  Laterality: N/A;   Family History  Problem Relation Age of Onset  . Diabetes Other   . Hypertension Other   . Uterine cancer Other   . Diabetes Mother   . Heart attack Brother    Social History  Substance Use Topics  . Smoking status: Former SResearch scientist (life sciences) . Smokeless tobacco: Never Used  . Alcohol Use: No   OB History    No data available     Review of Systems  Constitutional: Negative for fever and chills.  Respiratory: Negative for cough and shortness of breath.   Cardiovascular: Negative for chest pain.  Gastrointestinal: Positive for nausea. Negative for vomiting and abdominal pain.  Endocrine: Negative for polyuria.  Neurological:  Positive for weakness. Negative for headaches.  Psychiatric/Behavioral: Negative for confusion.  All other systems reviewed and are negative.     Allergies  Statins and Vytorin  Home Medications   Prior to Admission medications   Medication Sig Start Date End Date Taking? Authorizing Provider  aspirin EC 81 MG EC tablet Take 1 tablet (81 mg total) by mouth daily. 08/07/12  Yes Modena Jansky, MD  atorvastatin (LIPITOR) 10 MG tablet Take 40 mg by mouth once a week.    Yes Historical Provider, MD  Blood Glucose Monitoring Suppl ([place]) w/Device KIT 1 each by Does not  apply route daily. Dx: E11.40 10/10/15  Yes Philemon Kingdom, MD  carvedilol (COREG) 12.5 MG tablet TAKE 1 TABLET TWICE A DAY WITH MEALS   Yes Rande Brunt, NP  furosemide (LASIX) 20 MG tablet Take 20 mg by mouth daily as needed for fluid.    Yes Historical Provider, MD  glipiZIDE (GLUCOTROL) 10 MG tablet TAKE 1 TABLET TWICE A DAY BEFORE MEALS 10/06/15  Yes Philemon Kingdom, MD  glucose blood (ONETOUCH VERIO) test strip Use to test blood sugar once daily as instructed. Dx: E11.40 10/10/15  Yes Philemon Kingdom, MD  HYDROcodone-acetaminophen (NORCO/VICODIN) 5-325 MG tablet Take 1 tablet by mouth every 4 hours as needed *DO NOT EXCEED 3 GM APAP/24 HRS* 07/12/15  Yes Janith Lima, MD  Insulin Glargine (LANTUS SOLOSTAR) 100 UNIT/ML Solostar Pen Inject 14 Units into the skin daily before breakfast. 05/03/15  Yes Philemon Kingdom, MD  Insulin Pen Needle 32G X 4 MM MISC Use 1x a day 05/03/15  Yes Philemon Kingdom, MD  levothyroxine (SYNTHROID, LEVOTHROID) 137 MCG tablet Take 1 tablet (137 mcg total) by mouth daily before breakfast. 10/11/15  Yes Philemon Kingdom, MD  lisinopril (PRINIVIL,ZESTRIL) 5 MG tablet Take 1 tablet (5 mg total) by mouth 2 (two) times daily. 08/29/15  Yes Jolaine Artist, MD  metFORMIN (GLUCOPHAGE-XR) 750 MG 24 hr tablet Take 1 tablet (750 mg total) by mouth 2 (two) times daily. 10/06/15  Yes Philemon Kingdom, MD  omeprazole (PRILOSEC) 20 MG capsule Take 20 mg by mouth daily as needed (for acid reflux/heartburn.).    Yes Historical Provider, MD  [facility] DELICA LANCETS FINE MISC Use to test blood sugar 1 time daily. Dx: E11.40 10/10/15  Yes Philemon Kingdom, MD  Vitamin D, Ergocalciferol, (DRISDOL) 50000 units CAPS capsule TAKE 1 CAPSULE EVERY 7 DAYS ON MONDAYS 08/17/15  Yes Janith Lima, MD  LANTUS SOLOSTAR 100 UNIT/ML Solostar Pen INJECT 30 UNITS INTO THE SKIN DAILY AT 10 P.M Patient not taking: Reported on 12/31/2015 08/17/15   Janith Lima, MD   Triage Vitals: BP 155/69 mmHg  Pulse  62  Temp(Src) 98.1 F (36.7 C) (Oral)  Resp 18  SpO2 95%   Physical Exam  Constitutional: She is oriented to person, place, and time. She appears well-developed and well-nourished. No distress.  Nontoxic in appearance   HENT:  Head: Normocephalic and atraumatic.  Eyes: EOM are normal.  Neck: Normal range of motion.  Cardiovascular: Normal rate, regular rhythm and normal heart sounds.   No murmur heard. Pulmonary/Chest: Effort normal and breath sounds normal. She has no wheezes. She has no rales.  Abdominal: Soft. Bowel sounds are normal. She exhibits no distension. There is no tenderness.  Musculoskeletal: Normal range of motion. She exhibits no edema.  No peripheral or pitting edema noted   Neurological: She is alert and oriented to person, place, and time.  Skin: Skin is warm and dry. She is not diaphoretic.  Psychiatric: She has a normal mood and affect. Her behavior is normal. Judgment normal.  Nursing note and vitals reviewed.   ED Course  Procedures (including critical care time)  DIAGNOSTIC STUDIES: Oxygen Saturation is 95% on RA, adequate by my interpretation.    COORDINATION OF CARE: 10:27 PM- Will order blood work and EKG. Will give fluids. Discussed treatment plan with pt at bedside and pt agreed to plan.     Labs Review Labs Reviewed  CBG MONITORING, ED - Abnormal; Notable for the following:    Glucose-Capillary 310 (*)    All other components within normal limits  URINE CULTURE  CBC WITH DIFFERENTIAL/PLATELET  TROPONIN I  COMPREHENSIVE METABOLIC PANEL  URINALYSIS, ROUTINE W REFLEX MICROSCOPIC (NOT AT [facility])   Results for orders placed or performed during the hospital encounter of 12/31/15  CBC with Differential  Result Value Ref Range   WBC 6.5 4.0 - 10.5 K/uL   RBC 4.70 3.87 - 5.11 MIL/uL   Hemoglobin 12.7 12.0 - 15.0 g/dL   HCT 38.2 36.0 - 46.0 %   MCV 81.3 78.0 - 100.0 fL   MCH 27.0 26.0 - 34.0 pg   MCHC 33.2 30.0 - 36.0 g/dL   RDW 14.8 11.5 -  15.5 %   Platelets 253 150 - 400 K/uL   Neutrophils Relative % 48 %   Neutro Abs 3.2 1.7 - 7.7 K/uL   Lymphocytes Relative 46 %   Lymphs Abs 3.0 0.7 - 4.0 K/uL   Monocytes Relative 4 %   Monocytes Absolute 0.3 0.1 - 1.0 K/uL   Eosinophils Relative 1 %   Eosinophils Absolute 0.1 0.0 - 0.7 K/uL   Basophils Relative 1 %   Basophils Absolute 0.0 0.0 - 0.1 K/uL  Troponin I  Result Value Ref Range   Troponin I <0.03 <0.031 ng/mL  Comprehensive metabolic panel  Result Value Ref Range   Sodium 135 135 - 145 mmol/L   Potassium 3.9 3.5 - 5.1 mmol/L   Chloride 104 101 - 111 mmol/L   CO2 21 (L) 22 - 32 mmol/L   Glucose, Bld 324 (H) 65 - 99 mg/dL   BUN 27 (H) 6 - 20 mg/dL   Creatinine, Ser 1.58 (H) 0.44 - 1.00 mg/dL   Calcium 9.5 8.9 - 10.3 mg/dL   Total Protein 8.3 (H) 6.5 - 8.1 g/dL   Albumin 4.2 3.5 - 5.0 g/dL   AST 20 15 - 41 U/L   ALT 14 14 - 54 U/L   Alkaline Phosphatase 62 38 - 126 U/L   Total Bilirubin 0.6 0.3 - 1.2 mg/dL   GFR calc non Af Amer 30 (L) >60 mL/min   GFR calc Af Amer 35 (L) >60 mL/min   Anion gap 10 5 - 15  Urinalysis, Routine w reflex microscopic  Result Value Ref Range   Color, Urine YELLOW YELLOW   APPearance CLEAR CLEAR   Specific Gravity, Urine 1.019 1.005 - 1.030   pH 5.0 5.0 - 8.0   Glucose, UA >1000 (A) NEGATIVE mg/dL   Hgb urine dipstick NEGATIVE NEGATIVE   Bilirubin Urine NEGATIVE NEGATIVE   Ketones, ur NEGATIVE NEGATIVE mg/dL   Protein, ur NEGATIVE NEGATIVE mg/dL   Nitrite NEGATIVE NEGATIVE   Leukocytes, UA NEGATIVE NEGATIVE  Urine microscopic-add on  Result Value Ref Range   Squamous Epithelial / LPF NONE SEEN NONE SEEN   WBC, UA NONE SEEN 0 - 5 WBC/hpf  RBC / HPF 0-5 0 - 5 RBC/hpf   Bacteria, UA RARE (A) NONE SEEN  CBG monitoring, ED  Result Value Ref Range   Glucose-Capillary 310 (H) 65 - 99 mg/dL  CBG monitoring, ED  Result Value Ref Range   Glucose-Capillary 259 (H) 65 - 99 mg/dL  CBG monitoring, ED  Result Value Ref Range    Glucose-Capillary 243 (H) 65 - 99 mg/dL   Dg Chest Portable 1 View  01/01/2016  CLINICAL DATA:  Acute onset of dizziness and lightheadedness. Hyperglycemia. Initial encounter. EXAM: PORTABLE CHEST 1 VIEW COMPARISON:  Chest radiograph performed 12/03/2013 FINDINGS: The lungs are well-aerated and clear. There is no evidence of focal opacification, pleural effusion or pneumothorax. The cardiomediastinal silhouette is borderline normal in size. A pacemaker is noted overlying the left chest wall, with leads ending overlying the right atrium, right ventricle and coronary sinus. No acute osseous abnormalities are seen. IMPRESSION: No acute cardiopulmonary process seen. Electronically Signed   By: Garald Balding M.D.   On: 01/01/2016 01:38    Imaging Review No results found. I have personally reviewed and evaluated these images and lab results as part of my medical decision-making.   EKG Interpretation None      MDM   Final diagnoses:  None    1. Hyperglycemia  The patient presents with generalized weakness, nausea without vomiting, mild dizziness without syncope or fall. Here with sons. She denies pain, fever. Labs and imaging are unremarkable for evidence of infection. She has hyperglycemia without acidosis that is responding to IVF's. The patient states she wishes to go home adn is felt appropriate for discharge.   I personally performed the services described in this documentation, which was scribed in my presence. The recorded information has been reviewed and is accurate.     Charlann Lange, PA-C 01/01/16 [id number]  Isla Pence, MD 01/02/16 [phone]

## 2016-01-01 ENCOUNTER — Emergency Department (HOSPITAL_COMMUNITY): Payer: Medicare Other

## 2016-01-01 DIAGNOSIS — E1165 Type 2 diabetes mellitus with hyperglycemia: Secondary | ICD-10-CM | POA: Diagnosis not present

## 2016-01-01 DIAGNOSIS — R42 Dizziness and giddiness: Secondary | ICD-10-CM | POA: Diagnosis not present

## 2016-01-01 LAB — URINALYSIS, ROUTINE W REFLEX MICROSCOPIC
BILIRUBIN URINE: NEGATIVE
Glucose, UA: >1000 mg/dL — AB
HGB URINE DIPSTICK: NEGATIVE
Ketones, ur: NEGATIVE mg/dL
Leukocytes, UA: NEGATIVE
Nitrite: NEGATIVE
PROTEIN: NEGATIVE mg/dL
Specific Gravity, Urine: 1.019 (ref 1.005–1.030)
pH: 5 (ref 5.0–8.0)

## 2016-01-01 LAB — URINE MICROSCOPIC-ADD ON
SQUAMOUS EPITHELIAL / LPF: NONE SEEN
WBC, UA: NONE SEEN WBC/hpf (ref 0–5)

## 2016-01-01 LAB — CBG MONITORING, ED
Glucose-Capillary: 243 mg/dL — ABNORMAL HIGH (ref 65–99)
Glucose-Capillary: 259 mg/dL — ABNORMAL HIGH (ref 65–99)

## 2016-01-01 NOTE — Discharge Instructions (Signed)
CONTINUE YOUR INSULIN DOSING AS PRESCRIBED. MONITOR YOUR BLOOD SUGAR 3-4 TIMES DAILY. RETURN TO THE EMERGENCY DEPARTMENT WITH ANY FEVER, PAIN OR NEW CONCERN, OTHERWISE, FOLLOW UP WITH YOUR PRIMARY CARE DOCTOR FOR RECHECK THIS WEEK.    Hyperglycemia Hyperglycemia occurs when the glucose (sugar) in your blood is too high. Hyperglycemia can happen for many reasons, but it most often happens to people who do not know they have diabetes or are not managing their diabetes properly.  CAUSES  Whether you have diabetes or not, there are other causes of hyperglycemia. Hyperglycemia can occur when you have diabetes, but it can also occur in other situations that you might not be as aware of, such as: Diabetes  If you have diabetes and are having problems controlling your blood glucose, hyperglycemia could occur because of some of the following reasons:  Not following your meal plan.  Not taking your diabetes medications or not taking it properly.  Exercising less or doing less activity than you normally do.  Being sick. Pre-diabetes  This cannot be ignored. Before people develop Type 2 diabetes, they almost always have "pre-diabetes." This is when your blood glucose levels are higher than normal, but not yet high enough to be diagnosed as diabetes. Research has shown that some long-term damage to the body, especially the heart and circulatory system, may already be occurring during pre-diabetes. If you take action to manage your blood glucose when you have pre-diabetes, you may delay or prevent Type 2 diabetes from developing. Stress  If you have diabetes, you may be "diet" controlled or on oral medications or insulin to control your diabetes. However, you may find that your blood glucose is higher than usual in the hospital whether you have diabetes or not. This is often referred to as "stress hyperglycemia." Stress can elevate your blood glucose. This happens because of hormones put out by the body  during times of stress. If stress has been the cause of your high blood glucose, it can be followed regularly by your caregiver. That way he/she can make sure your hyperglycemia does not continue to get worse or progress to diabetes. Steroids  Steroids are medications that act on the infection fighting system (immune system) to block inflammation or infection. One side effect can be a rise in blood glucose. Most people can produce enough extra insulin to allow for this rise, but for those who cannot, steroids make blood glucose levels go even higher. It is not unusual for steroid treatments to "uncover" diabetes that is developing. It is not always possible to determine if the hyperglycemia will go away after the steroids are stopped. A special blood test called an A1c is sometimes done to determine if your blood glucose was elevated before the steroids were started. SYMPTOMS  Thirsty.  Frequent urination.  Dry mouth.  Blurred vision.  Tired or fatigue.  Weakness.  Sleepy.  Tingling in feet or leg. DIAGNOSIS  Diagnosis is made by monitoring blood glucose in one or all of the following ways:  A1c test. This is a chemical found in your blood.  Fingerstick blood glucose monitoring.  Laboratory results. TREATMENT  First, knowing the cause of the hyperglycemia is important before the hyperglycemia can be treated. Treatment may include, but is not be limited to:  Education.  Change or adjustment in medications.  Change or adjustment in meal plan.  Treatment for an illness, infection, etc.  More frequent blood glucose monitoring.  Change in exercise plan.  Decreasing or stopping steroids.  Lifestyle changes. HOME CARE INSTRUCTIONS   Test your blood glucose as directed.  Exercise regularly. Your caregiver will give you instructions about exercise. Pre-diabetes or diabetes which comes on with stress is helped by exercising.  Eat wholesome, balanced meals. Eat often and at  regular, fixed times. Your caregiver or nutritionist will give you a meal plan to guide your sugar intake.  Being at an ideal weight is important. If needed, losing as little as 10 to 15 pounds may help improve blood glucose levels. SEEK MEDICAL CARE IF:   You have questions about medicine, activity, or diet.  You continue to have symptoms (problems such as increased thirst, urination, or weight gain). SEEK IMMEDIATE MEDICAL CARE IF:   You are vomiting or have diarrhea.  Your breath smells fruity.  You are breathing faster or slower.  You are very sleepy or incoherent.  You have numbness, tingling, or pain in your feet or hands.  You have chest pain.  Your symptoms get worse even though you have been following your caregiver's orders.  If you have any other questions or concerns.   This information is not intended to replace advice given to you by your health care provider. Make sure you discuss any questions you have with your health care provider.   Document Released: 01/22/2001 Document Revised: 10/21/2011 Document Reviewed: 04/04/2015 Elsevier Interactive Patient Education [employer]

## 2016-01-02 LAB — URINE CULTURE: Culture: NO GROWTH

## 2016-01-03 ENCOUNTER — Ambulatory Visit (INDEPENDENT_AMBULATORY_CARE_PROVIDER_SITE_OTHER): Payer: Medicare Other | Admitting: Internal Medicine

## 2016-01-03 ENCOUNTER — Encounter: Payer: Self-pay | Admitting: Internal Medicine

## 2016-01-03 VITALS — BP 164/96 | HR 68 | Wt 138.0 lb

## 2016-01-03 DIAGNOSIS — E1140 Type 2 diabetes mellitus with diabetic neuropathy, unspecified: Secondary | ICD-10-CM

## 2016-01-03 DIAGNOSIS — I2510 Atherosclerotic heart disease of native coronary artery without angina pectoris: Secondary | ICD-10-CM

## 2016-01-03 DIAGNOSIS — I420 Dilated cardiomyopathy: Secondary | ICD-10-CM | POA: Diagnosis not present

## 2016-01-03 DIAGNOSIS — I10 Essential (primary) hypertension: Secondary | ICD-10-CM

## 2016-01-03 DIAGNOSIS — Z794 Long term (current) use of insulin: Secondary | ICD-10-CM

## 2016-01-03 MED ORDER — INSULIN GLARGINE 100 UNIT/ML SOLOSTAR PEN: 14.0000 [IU] | PEN_INJECTOR | Freq: Every morning | SUBCUTANEOUS | Status: DC

## 2016-01-03 NOTE — Progress Notes (Signed)
Subjective:  Patient ID: [name] [name], female    DOB: 08-27-1933  Age: 82 y.o. MRN: [id number]  CC: No chief complaint on file.   HPI [name] [name] presents for ER visit f/u due to high sugars >300. She felt weak and dizzy (ER note from 5/21 was reviewed). She stopped Metformin 1 week ago due to loose stools. She has been using Lantus 14 u qam per [name] [name].  Sugars are better - doing well now  Outpatient Prescriptions Prior to Visit  Medication Sig Dispense Refill  . aspirin EC 81 MG EC tablet Take 1 tablet (81 mg total) by mouth daily. 30 tablet 0  . atorvastatin (LIPITOR) 10 MG tablet Take 40 mg by mouth once a week.     . Blood Glucose Monitoring Suppl ([place]) w/Device KIT 1 each by Does not apply route daily. Dx: E11.40 1 kit 0  . carvedilol (COREG) 12.5 MG tablet TAKE 1 TABLET TWICE A DAY WITH MEALS 180 tablet 5  . furosemide (LASIX) 20 MG tablet Take 20 mg by mouth daily as needed for fluid.     Marland Kitchen glipiZIDE (GLUCOTROL) 10 MG tablet TAKE 1 TABLET TWICE A DAY BEFORE MEALS 180 tablet 1  . glucose blood (ONETOUCH VERIO) test strip Use to test blood sugar once daily as instructed. Dx: E11.40 100 each 3  . HYDROcodone-acetaminophen (NORCO/VICODIN) 5-325 MG tablet Take 1 tablet by mouth every 4 hours as needed *DO NOT EXCEED 3 GM APAP/24 HRS* 60 tablet 0  . Insulin Glargine (LANTUS SOLOSTAR) 100 UNIT/ML Solostar Pen Inject 14 Units into the skin daily before breakfast. 15 mL 2  . Insulin Pen Needle 32G X 4 MM MISC Use 1x a day 100 each 1  . LANTUS SOLOSTAR 100 UNIT/ML Solostar Pen INJECT 30 UNITS INTO THE SKIN DAILY AT 10 P.M 15 mL 10  . levothyroxine (SYNTHROID, LEVOTHROID) 137 MCG tablet Take 1 tablet (137 mcg total) by mouth daily before breakfast. 90 tablet 1  . lisinopril (PRINIVIL,ZESTRIL) 5 MG tablet Take 1 tablet (5 mg total) by mouth 2 (two) times daily. 180 tablet 3  . metFORMIN (GLUCOPHAGE-XR) 750 MG 24 hr tablet Take 1 tablet (750 mg total) by mouth 2  (two) times daily. 180 tablet 1  . omeprazole (PRILOSEC) 20 MG capsule Take 20 mg by mouth daily as needed (for acid reflux/heartburn.).     Marland Kitchen ONETOUCH DELICA LANCETS FINE MISC Use to test blood sugar 1 time daily. Dx: E11.40 100 each 3  . Vitamin D, Ergocalciferol, (DRISDOL) 50000 units CAPS capsule TAKE 1 CAPSULE EVERY 7 DAYS ON MONDAYS 12 capsule 2   No facility-administered medications prior to visit.    ROS Review of Systems  Constitutional: Positive for fatigue. Negative for chills, activity change, appetite change and unexpected weight change.  HENT: Negative for congestion, mouth sores and sinus pressure.   Eyes: Negative for visual disturbance.  Respiratory: Negative for cough and chest tightness.   Gastrointestinal: Negative for nausea and abdominal pain.  Genitourinary: Negative for frequency, difficulty urinating and vaginal pain.  Musculoskeletal: Negative for back pain and gait problem.  Skin: Negative for pallor and rash.  Neurological: Negative for dizziness, tremors, weakness, numbness and headaches.  Psychiatric/Behavioral: Negative for confusion and sleep disturbance.    Objective:  BP 164/96 mmHg  Pulse 68  Wt 138 lb (62.596 kg)  SpO2 97%  BP Readings from Last 3 Encounters:  01/03/16 164/96  01/01/16 146/67  12/27/15 146/76    Wt  Readings from Last 3 Encounters:  01/03/16 138 lb (62.596 kg)  12/27/15 137 lb 12.8 oz (62.506 kg)  10/06/15 142 lb (64.411 kg)    Physical Exam  Constitutional: She appears well-developed. No distress.  HENT:  Head: Normocephalic.  Right Ear: External ear normal.  Left Ear: External ear normal.  Nose: Nose normal.  Mouth/Throat: Oropharynx is clear and moist.  Eyes: Conjunctivae are normal. Pupils are equal, round, and reactive to light. Right eye exhibits no discharge. Left eye exhibits no discharge.  Neck: Normal range of motion. Neck supple. No JVD present. No tracheal deviation present. No thyromegaly present.    Cardiovascular: Normal rate, regular rhythm and normal heart sounds.   Pulmonary/Chest: No stridor. No respiratory distress. She has no wheezes.  Abdominal: Soft. Bowel sounds are normal. She exhibits no distension and no mass. There is no tenderness. There is no rebound and no guarding.  Musculoskeletal: She exhibits no edema or tenderness.  Lymphadenopathy:    She has no cervical adenopathy.  Neurological: She displays normal reflexes. No cranial nerve deficit. She exhibits normal muscle tone. Coordination normal.  Skin: No rash noted. No erythema.  Psychiatric: She has a normal mood and affect. Her behavior is normal. Judgment and thought content normal.  pacemaker  Lab Results  Component Value Date   WBC 6.5 12/31/2015   HGB 12.7 12/31/2015   HCT 38.2 12/31/2015   PLT 253 12/31/2015   GLUCOSE 324* 12/31/2015   CHOL 240* 11/09/2014   TRIG 251.0* 11/09/2014   HDL 38.90* 11/09/2014   LDLDIRECT 140.0 11/09/2014   LDLCALC 70 12/06/2013   ALT 14 12/31/2015   AST 20 12/31/2015   NA 135 12/31/2015   K 3.9 12/31/2015   CL 104 12/31/2015   CREATININE 1.58* 12/31/2015   BUN 27* 12/31/2015   CO2 21* 12/31/2015   TSH 5.20* 10/06/2015   INR 0.98 12/03/2013   HGBA1C 11.5 05/03/2015   MICROALBUR 7.4* 11/09/2014    Dg Chest Portable 1 View  01/01/2016  CLINICAL DATA:  Acute onset of dizziness and lightheadedness. Hyperglycemia. Initial encounter. EXAM: PORTABLE CHEST 1 VIEW COMPARISON:  Chest radiograph performed 12/03/2013 FINDINGS: The lungs are well-aerated and clear. There is no evidence of focal opacification, pleural effusion or pneumothorax. The cardiomediastinal silhouette is borderline normal in size. A pacemaker is noted overlying the left chest wall, with leads ending overlying the right atrium, right ventricle and coronary sinus. No acute osseous abnormalities are seen. IMPRESSION: No acute cardiopulmonary process seen. Electronically Signed   By: [name] [name] M.D.   On:  01/01/2016 01:38    Assessment & Plan:   There are no diagnoses linked to this encounter. I am having [name] [name] maintain her aspirin, carvedilol, atorvastatin, Insulin Glargine, Insulin Pen Needle, HYDROcodone-acetaminophen, LANTUS SOLOSTAR, Vitamin D (Ergocalciferol), furosemide, omeprazole, lisinopril, glipiZIDE, metFORMIN, ONETOUCH VERIO FLEX SYSTEM, glucose blood, ONETOUCH DELICA LANCETS FINE, and levothyroxine.  No orders of the defined types were placed in this encounter.     Follow-up: No Follow-up on file.  Walker Kehr, MD

## 2016-01-03 NOTE — Assessment & Plan Note (Signed)
12/31/15 hyperglycemia ER visit She stopped Metformin 1 week ago due to loose stools. She has been using Lantus 14 u qam per Dr Elvera Lennox. On Glipizide Better diet Sugars are better - doing well now

## 2016-01-03 NOTE — Assessment & Plan Note (Signed)
Coreg, Furosemide, Lisinopril 

## 2016-01-03 NOTE — Assessment & Plan Note (Signed)
Coreg, Furosemide, Lisinopril, Lipitor, ASA

## 2016-01-03 NOTE — Assessment & Plan Note (Addendum)
Coreg, Furosemide, Lisinopril

## 2016-01-03 NOTE — Progress Notes (Signed)
Pre visit review using our clinic review tool, if applicable. No additional management support is needed unless otherwise documented below in the visit note. 

## 2016-01-04 ENCOUNTER — Telehealth: Payer: Self-pay | Admitting: *Deleted

## 2016-01-04 MED ORDER — GLUCOSE BLOOD VI STRP: 1.0000 | ORAL_STRIP | Freq: Two times a day (BID) | Status: DC

## 2016-01-04 NOTE — Telephone Encounter (Signed)
Left msg on triage stating [facility] chek BS strips. Sent rx electronically...[name]

## 2016-01-04 NOTE — Addendum Note (Signed)
Addended by: Deatra James on: 01/04/2016 11:16 AM   Modules accepted: Orders

## 2016-01-04 NOTE — Telephone Encounter (Signed)
Receive call pt is requesting a 30 day on accu chek strips to be sent to [facility] since she is completely out of strips. Inform pt will send...lmb

## 2016-01-05 ENCOUNTER — Telehealth: Payer: Self-pay | Admitting: *Deleted

## 2016-01-05 MED ORDER — GLUCOSE BLOOD VI STRP: 1.0000 | ORAL_STRIP | Freq: Two times a day (BID) | Status: DC

## 2016-01-05 NOTE — Telephone Encounter (Signed)
Rec fax stating [employer] will only pay for Freestyle Lite Test strips.  Done. See meds.

## 2016-01-10 ENCOUNTER — Inpatient Hospital Stay (HOSPITAL_COMMUNITY): Payer: Medicare Other

## 2016-01-10 ENCOUNTER — Encounter: Payer: Self-pay | Admitting: Internal Medicine

## 2016-01-10 ENCOUNTER — Telehealth: Payer: Self-pay

## 2016-01-10 ENCOUNTER — Ambulatory Visit (INDEPENDENT_AMBULATORY_CARE_PROVIDER_SITE_OTHER)
Admission: RE | Admit: 2016-01-10 | Discharge: 2016-01-10 | Disposition: A | Discharge status: Home / Self Care | Payer: Medicare Other | Source: Ambulatory Visit | Attending: Internal Medicine | Admitting: Internal Medicine

## 2016-01-10 ENCOUNTER — Other Ambulatory Visit: Payer: Self-pay | Admitting: Internal Medicine

## 2016-01-10 ENCOUNTER — Ambulatory Visit (INDEPENDENT_AMBULATORY_CARE_PROVIDER_SITE_OTHER): Payer: Medicare Other | Admitting: Internal Medicine

## 2016-01-10 ENCOUNTER — Encounter (HOSPITAL_COMMUNITY): Payer: Self-pay | Admitting: Emergency Medicine

## 2016-01-10 ENCOUNTER — Inpatient Hospital Stay (HOSPITAL_COMMUNITY)
Admission: EM | Admit: 2016-01-10 | Discharge: 2016-01-12 | DRG: 065 | Disposition: A | Discharge status: Home Health | Payer: Medicare Other | Attending: Internal Medicine | Admitting: Internal Medicine

## 2016-01-10 ENCOUNTER — Other Ambulatory Visit (INDEPENDENT_AMBULATORY_CARE_PROVIDER_SITE_OTHER): Payer: Medicare Other

## 2016-01-10 VITALS — BP 142/88 | HR 60 | Temp 97.9°F | Resp 16 | Ht 64.0 in | Wt 138.0 lb

## 2016-01-10 DIAGNOSIS — Z794 Long term (current) use of insulin: Secondary | ICD-10-CM

## 2016-01-10 DIAGNOSIS — E781 Pure hyperglyceridemia: Secondary | ICD-10-CM

## 2016-01-10 DIAGNOSIS — I63331 Cerebral infarction due to thrombosis of right posterior cerebral artery: Secondary | ICD-10-CM

## 2016-01-10 DIAGNOSIS — R270 Ataxia, unspecified: Secondary | ICD-10-CM

## 2016-01-10 DIAGNOSIS — E1165 Type 2 diabetes mellitus with hyperglycemia: Secondary | ICD-10-CM | POA: Diagnosis present

## 2016-01-10 DIAGNOSIS — G250 Essential tremor: Secondary | ICD-10-CM

## 2016-01-10 DIAGNOSIS — Z87891 Personal history of nicotine dependence: Secondary | ICD-10-CM | POA: Diagnosis not present

## 2016-01-10 DIAGNOSIS — I639 Cerebral infarction, unspecified: Secondary | ICD-10-CM

## 2016-01-10 DIAGNOSIS — R29704 NIHSS score 4: Secondary | ICD-10-CM | POA: Diagnosis present

## 2016-01-10 DIAGNOSIS — E0840 Diabetes mellitus due to underlying condition with diabetic neuropathy, unspecified: Secondary | ICD-10-CM

## 2016-01-10 DIAGNOSIS — E1140 Type 2 diabetes mellitus with diabetic neuropathy, unspecified: Secondary | ICD-10-CM

## 2016-01-10 DIAGNOSIS — I5042 Chronic combined systolic (congestive) and diastolic (congestive) heart failure: Secondary | ICD-10-CM | POA: Diagnosis present

## 2016-01-10 DIAGNOSIS — R9431 Abnormal electrocardiogram [ECG] [EKG]: Secondary | ICD-10-CM | POA: Diagnosis not present

## 2016-01-10 DIAGNOSIS — H53462 Homonymous bilateral field defects, left side: Secondary | ICD-10-CM | POA: Diagnosis present

## 2016-01-10 DIAGNOSIS — E785 Hyperlipidemia, unspecified: Secondary | ICD-10-CM | POA: Diagnosis not present

## 2016-01-10 DIAGNOSIS — Z8249 Family history of ischemic heart disease and other diseases of the circulatory system: Secondary | ICD-10-CM

## 2016-01-10 DIAGNOSIS — I420 Dilated cardiomyopathy: Secondary | ICD-10-CM | POA: Diagnosis present

## 2016-01-10 DIAGNOSIS — E038 Other specified hypothyroidism: Secondary | ICD-10-CM

## 2016-01-10 DIAGNOSIS — Z79899 Other long term (current) drug therapy: Secondary | ICD-10-CM | POA: Diagnosis not present

## 2016-01-10 DIAGNOSIS — I6522 Occlusion and stenosis of left carotid artery: Secondary | ICD-10-CM | POA: Diagnosis not present

## 2016-01-10 DIAGNOSIS — Z950 Presence of cardiac pacemaker: Secondary | ICD-10-CM

## 2016-01-10 DIAGNOSIS — Z7982 Long term (current) use of aspirin: Secondary | ICD-10-CM

## 2016-01-10 DIAGNOSIS — I10 Essential (primary) hypertension: Secondary | ICD-10-CM

## 2016-01-10 DIAGNOSIS — I6350 Cerebral infarction due to unspecified occlusion or stenosis of unspecified cerebral artery: Secondary | ICD-10-CM | POA: Diagnosis not present

## 2016-01-10 DIAGNOSIS — I110 Hypertensive heart disease with heart failure: Secondary | ICD-10-CM | POA: Diagnosis present

## 2016-01-10 DIAGNOSIS — I5022 Chronic systolic (congestive) heart failure: Secondary | ICD-10-CM | POA: Diagnosis present

## 2016-01-10 DIAGNOSIS — R5383 Other fatigue: Secondary | ICD-10-CM | POA: Diagnosis not present

## 2016-01-10 DIAGNOSIS — I2510 Atherosclerotic heart disease of native coronary artery without angina pectoris: Secondary | ICD-10-CM | POA: Diagnosis present

## 2016-01-10 DIAGNOSIS — E7800 Pure hypercholesterolemia, unspecified: Secondary | ICD-10-CM | POA: Diagnosis present

## 2016-01-10 DIAGNOSIS — E039 Hypothyroidism, unspecified: Secondary | ICD-10-CM | POA: Diagnosis not present

## 2016-01-10 DIAGNOSIS — I6329 Cerebral infarction due to unspecified occlusion or stenosis of other precerebral arteries: Secondary | ICD-10-CM | POA: Diagnosis not present

## 2016-01-10 DIAGNOSIS — Z8673 Personal history of transient ischemic attack (TIA), and cerebral infarction without residual deficits: Secondary | ICD-10-CM

## 2016-01-10 DIAGNOSIS — Z888 Allergy status to other drugs, medicaments and biological substances status: Secondary | ICD-10-CM | POA: Diagnosis not present

## 2016-01-10 DIAGNOSIS — K219 Gastro-esophageal reflux disease without esophagitis: Secondary | ICD-10-CM | POA: Diagnosis present

## 2016-01-10 DIAGNOSIS — Z833 Family history of diabetes mellitus: Secondary | ICD-10-CM

## 2016-01-10 DIAGNOSIS — I6523 Occlusion and stenosis of bilateral carotid arteries: Secondary | ICD-10-CM | POA: Diagnosis not present

## 2016-01-10 DIAGNOSIS — I6621 Occlusion and stenosis of right posterior cerebral artery: Secondary | ICD-10-CM | POA: Diagnosis not present

## 2016-01-10 HISTORY — DX: Essential (primary) hypertension: I10

## 2016-01-10 LAB — CBC
HEMATOCRIT: 39.3 % (ref 36.0–46.0)
HEMATOCRIT: 36.7 % (ref 36.0–46.0)
HEMOGLOBIN: 12.7 g/dL (ref 12.0–15.0)
HEMOGLOBIN: 11.9 g/dL — AB (ref 12.0–15.0)
MCH: 26.4 pg (ref 26.0–34.0)
MCH: 26.5 pg (ref 26.0–34.0)
MCHC: 32.3 g/dL (ref 30.0–36.0)
MCHC: 32.4 g/dL (ref 30.0–36.0)
MCV: 81.7 fL (ref 78.0–100.0)
MCV: 81.7 fL (ref 78.0–100.0)
Platelets: 292 10*3/uL (ref 150–400)
Platelets: 256 10*3/uL (ref 150–400)
RBC: 4.81 MIL/uL (ref 3.87–5.11)
RBC: 4.49 MIL/uL (ref 3.87–5.11)
RDW: 14.6 % (ref 11.5–15.5)
RDW: 14.5 % (ref 11.5–15.5)
WBC: 6.6 10*3/uL (ref 4.0–10.5)
WBC: 6.7 10*3/uL (ref 4.0–10.5)

## 2016-01-10 LAB — POCT GLUCOSE (DEVICE FOR HOME USE): POC Glucose: 187 mg/dl — AB (ref 70–99)

## 2016-01-10 LAB — COMPREHENSIVE METABOLIC PANEL
ALT: 14 U/L (ref 14–54)
AST: 22 U/L (ref 15–41)
Albumin: 3.8 g/dL (ref 3.5–5.0)
Alkaline Phosphatase: 59 U/L (ref 38–126)
Anion gap: 8 (ref 5–15)
BILIRUBIN TOTAL: 0.6 mg/dL (ref 0.3–1.2)
BUN: 10 mg/dL (ref 6–20)
CALCIUM: 9.6 mg/dL (ref 8.9–10.3)
CHLORIDE: 105 mmol/L (ref 101–111)
CO2: 25 mmol/L (ref 22–32)
CREATININE: 1 mg/dL (ref 0.44–1.00)
GFR, EST NON AFRICAN AMERICAN: 52 mL/min — AB (ref >60)
Glucose, Bld: 133 mg/dL — ABNORMAL HIGH (ref 65–99)
Potassium: 3.8 mmol/L (ref 3.5–5.1)
Sodium: 138 mmol/L (ref 135–145)
TOTAL PROTEIN: 7.4 g/dL (ref 6.5–8.1)

## 2016-01-10 LAB — URINALYSIS, ROUTINE W REFLEX MICROSCOPIC
BILIRUBIN URINE: NEGATIVE
HGB URINE DIPSTICK: NEGATIVE
KETONES UR: NEGATIVE
NITRITE: NEGATIVE
PH: 5.5 (ref 5.0–8.0)
RBC / HPF: NONE SEEN (ref 0–?)
Specific Gravity, Urine: 1.01 (ref 1.000–1.030)
Total Protein, Urine: NEGATIVE
Urine Glucose: NEGATIVE
Urobilinogen, UA: 0.2 (ref 0.0–1.0)

## 2016-01-10 LAB — TSH: TSH: 0.31 u[IU]/mL — ABNORMAL LOW (ref 0.35–4.50)

## 2016-01-10 LAB — MICROALBUMIN / CREATININE URINE RATIO
CREATININE, U: 119.7 mg/dL
MICROALB UR: 3.6 mg/dL — AB (ref 0.0–1.9)
Microalb Creat Ratio: 3 mg/g (ref 0.0–30.0)

## 2016-01-10 LAB — DIFFERENTIAL
Basophils Absolute: 0 10*3/uL (ref 0.0–0.1)
Basophils Relative: 1 %
EOS ABS: 0 10*3/uL (ref 0.0–0.7)
Eosinophils Relative: 1 %
LYMPHS ABS: 3.3 10*3/uL (ref 0.7–4.0)
Lymphocytes Relative: 50 %
MONOS PCT: 4 %
Monocytes Absolute: 0.2 10*3/uL (ref 0.1–1.0)
Neutro Abs: 3 10*3/uL (ref 1.7–7.7)
Neutrophils Relative %: 46 %

## 2016-01-10 LAB — GLUCOSE, CAPILLARY: GLUCOSE-CAPILLARY: 102 mg/dL — AB (ref 65–99)

## 2016-01-10 LAB — PROTIME-INR
INR: 1.08 (ref 0.00–1.49)
Prothrombin Time: 14.2 seconds (ref 11.6–15.2)

## 2016-01-10 LAB — CREATININE, SERUM
Creatinine, Ser: 0.94 mg/dL (ref 0.44–1.00)
GFR calc Af Amer: >60 mL/min (ref >60)
GFR, EST NON AFRICAN AMERICAN: 56 mL/min — AB (ref >60)

## 2016-01-10 LAB — APTT: APTT: 30 s (ref 24–37)

## 2016-01-10 LAB — LIPID PANEL
CHOL/HDL RATIO: 7
CHOLESTEROL: 242 mg/dL — AB (ref 0–200)
HDL: 33.6 mg/dL — ABNORMAL LOW (ref >39.00)
NonHDL: 208.52
TRIGLYCERIDES: 212 mg/dL — AB (ref 0.0–149.0)
VLDL: 42.4 mg/dL — ABNORMAL HIGH (ref 0.0–40.0)

## 2016-01-10 LAB — BASIC METABOLIC PANEL
BUN: 11 mg/dL (ref 6–23)
CHLORIDE: 103 meq/L (ref 96–112)
CO2: 26 meq/L (ref 19–32)
CREATININE: 1.07 mg/dL (ref 0.40–1.20)
Calcium: 9.8 mg/dL (ref 8.4–10.5)
GFR: 63.51 mL/min (ref >60.00)
GLUCOSE: 167 mg/dL — AB (ref 70–99)
POTASSIUM: 3.9 meq/L (ref 3.5–5.1)
Sodium: 138 mEq/L (ref 135–145)

## 2016-01-10 LAB — I-STAT TROPONIN, ED: TROPONIN I, POC: 0 ng/mL (ref 0.00–0.08)

## 2016-01-10 LAB — LDL CHOLESTEROL, DIRECT: Direct LDL: 141 mg/dL

## 2016-01-10 LAB — HEMOGLOBIN A1C: Hgb A1c MFr Bld: 11.5 % — ABNORMAL HIGH (ref 4.6–6.5)

## 2016-01-10 MED ORDER — STROKE: EARLY STAGES OF RECOVERY BOOK
Freq: Once | Status: AC
  Administered 2016-01-10: 1
  Filled 2016-01-10: qty 1

## 2016-01-10 MED ORDER — SENNOSIDES-DOCUSATE SODIUM 8.6-50 MG PO TABS: 1.0000 | ORAL_TABLET | Freq: Every evening | ORAL | Status: DC | PRN

## 2016-01-10 MED ORDER — INSULIN ASPART 100 UNIT/ML ~~LOC~~ SOLN
0.0000 [IU] | Freq: Three times a day (TID) | SUBCUTANEOUS | Status: DC
  Administered 2016-01-11: 3 [IU] via SUBCUTANEOUS
  Administered 2016-01-12: 2 [IU] via SUBCUTANEOUS
  Administered 2016-01-12: 1 [IU] via SUBCUTANEOUS

## 2016-01-10 MED ORDER — ENOXAPARIN SODIUM 40 MG/0.4ML ~~LOC~~ SOLN
40.0000 mg | Freq: Every day | SUBCUTANEOUS | Status: DC
  Administered 2016-01-11 – 2016-01-12 (×2): 40 mg via SUBCUTANEOUS
  Filled 2016-01-10 (×2): qty 0.4

## 2016-01-10 MED ORDER — SODIUM CHLORIDE 0.9% FLUSH
3.0000 mL | Freq: Two times a day (BID) | INTRAVENOUS | Status: DC
  Administered 2016-01-10 – 2016-01-12 (×4): 3 mL via INTRAVENOUS

## 2016-01-10 NOTE — H&P (Signed)
History and Physical    [name] [name] ZOX:[id number] DOB: 14-Jun-1934 DOA: 01/10/2016  PCP: Sanda Linger, MD   Patient coming from: Home.  Chief Complaint: Weakness and dizziness for about 10 days.  HPI: [name] [name] is a 82 y.o. female with medical history significant of hypothyroidism, hyperlipidemia, osteoarthritis, type 2 diabetes, CAD, left bundle branch block, chronic combined systolic and diastolic CHF, GERD who came to the emergency department due to recurrent dizziness episodes and unsteady gait for almost 2 weeks. She denies numbness or weakness, slurred speech or language articulation/comprehension difficulty. She describes this sensation as feeling drunk.  ED Course: Workup in the emergency department showed a CT scan of the brain with right subacute ischemic stroke affecting the posterior cervical artery.  Review of Systems: As per HPI otherwise 10 point review of systems negative.    Past Medical History  Diagnosis Date  . Hypothyroidism   . High cholesterol   . Osteoarthritis   . Chronic combined systolic and diastolic CHF, NYHA class 3 (HCC)     a. 03/2013 Echo: EF 25-30%, diff HK, sev antsept HK, mild MR.  . Congestive dilated cardiomyopathy (HCC)     a. 03/2013 Echo: EF 25-30%;  b. 09/2013 s/p SJM 3242 CRT-P.  Marland Kitchen Left bundle branch block   . Type II diabetes mellitus (HCC)   . GERD (gastroesophageal reflux disease)   . Coronary artery disease     a. 08/2012 Cath: LM nl, LAD 56m, LCX nondom, mild-mod nonobs dzs mid, OM1 ok, OM2 90/90p, RCA 40, EF 25-30%-->Med Rx.    Past Surgical History  Procedure Laterality Date  . Abdominal hysterectomy  1980  . Bmd  2006  . Bilateral cateract surgery    . Bi-ventricular pacemaker insertion (crt-p)      a. 09/2013 s/p SJM 3242 CRT-P.  . Orif tibia plateau Right 12/04/2013    Procedure: OPEN REDUCTION INTERNAL FIXATION (ORIF) TIBIAL PLATEAU;  Surgeon: Verlee Rossetti, MD;  Location: [facility] ORS;  Service: Orthopedics;  Laterality:  Right;  . Right heart catheterization N/A 09/08/2012    Procedure: RIGHT HEART CATH;  Surgeon: Dolores Patty, MD;  Location: [facility] CATH LAB;  Service: Cardiovascular;  Laterality: N/A;  . Bi-ventricular pacemaker insertion N/A 10/01/2013    Procedure: BI-VENTRICULAR PACEMAKER INSERTION (CRT-P);  Surgeon: Duke Salvia, MD;  Location: [facility] CATH LAB;  Service: Cardiovascular;  Laterality: N/A;     reports that she has quit smoking. She has never used smokeless tobacco. She reports that she does not drink alcohol or use illicit drugs.  Allergies  Allergen Reactions  . Statins Other (See Comments)    Leg cramping  . Metformin And Related     Pt stopped it due to diarrhea  . Vytorin [Ezetimibe-Simvastatin] Other (See Comments)    Leg cramps    Family History  Problem Relation Age of Onset  . Diabetes Other   . Hypertension Other   . Uterine cancer Other   . Diabetes Mother   . Heart attack Brother     Prior to Admission medications   Medication Sig Start Date End Date Taking? Authorizing Provider  aspirin EC 81 MG EC tablet Take 1 tablet (81 mg total) by mouth daily. 08/07/12   Elease Etienne, MD  atorvastatin (LIPITOR) 10 MG tablet Take 40 mg by mouth once a week.     Historical Provider, MD  carvedilol (COREG) 12.5 MG tablet TAKE 1 TABLET TWICE A DAY WITH MEALS    Karie Mainland B  Elsie Ra, NP  furosemide (LASIX) 20 MG tablet Take 20 mg by mouth daily as needed for fluid.     Historical Provider, MD  glipiZIDE (GLUCOTROL) 10 MG tablet TAKE 1 TABLET TWICE A DAY BEFORE MEALS 10/06/15   Carlus Pavlov, MD  glucose blood (ACCU-CHEK AVIVA PLUS) test strip 1 each by Other route 2 (two) times daily. Use to check blood sugars twice a day Dx E11.9 01/04/16   Aleksei Plotnikov V, MD  glucose blood (ONETOUCH VERIO) test strip Use to test blood sugar once daily as instructed. Dx: E11.40 10/10/15   Carlus Pavlov, MD  glucose blood test strip 1 each by Other route 2 (two) times daily. Dx E11.9 01/05/16    Tresa Garter, MD  HYDROcodone-acetaminophen (NORCO/VICODIN) 5-325 MG tablet Take 1 tablet by mouth every 4 hours as needed *DO NOT EXCEED 3 GM APAP/24 HRS* 07/12/15   [name], MD  Insulin Glargine (LANTUS SOLOSTAR) 100 UNIT/ML Solostar Pen Inject 14 Units into the skin every morning. 01/03/16   Tresa Garter, MD  Insulin Pen Needle 32G X 4 MM MISC Use 1x a day 05/03/15   Carlus Pavlov, MD  levothyroxine (SYNTHROID, LEVOTHROID) 137 MCG tablet Take 1 tablet (137 mcg total) by mouth daily before breakfast. 10/11/15   Carlus Pavlov, MD  lisinopril (PRINIVIL,ZESTRIL) 5 MG tablet Take 1 tablet (5 mg total) by mouth 2 (two) times daily. 08/29/15   Dolores Patty, MD  omeprazole (PRILOSEC) 20 MG capsule Take 20 mg by mouth daily as needed (for acid reflux/heartburn.).     Historical Provider, MD  [facility] DELICA LANCETS FINE MISC Use to test blood sugar 1 time daily. Dx: E11.40 10/10/15   Carlus Pavlov, MD  Vitamin D, Ergocalciferol, (DRISDOL) 50000 units CAPS capsule TAKE 1 CAPSULE EVERY 7 DAYS ON MONDAYS 08/17/15   [name], MD    Physical Exam: Filed Vitals:   01/10/16 2015 01/10/16 2030 01/10/16 2045 01/10/16 2100  BP: 167/78 172/69 150/68 164/68  Pulse: 62 62 64 63  Temp:      TempSrc:      Resp: 12 11 16 17   SpO2: 100% 100% 100% 100%      Constitutional: NAD, calm, comfortable Filed Vitals:   01/10/16 2015 01/10/16 2030 01/10/16 2045 01/10/16 2100  BP: 167/78 172/69 150/68 164/68  Pulse: 62 62 64 63  Temp:      TempSrc:      Resp: 12 11 16 17   SpO2: 100% 100% 100% 100%   Eyes: PERRL, lids and conjunctivae normal ENMT: Mucous membranes are moist. Posterior pharynx clear of any exudate or lesions.Normal dentition.  Neck: normal, supple, no masses, no thyromegaly Respiratory: clear to auscultation bilaterally, no wheezing, no crackles. Normal respiratory effort. No accessory muscle use.  Cardiovascular: Regular rate and rhythm, no murmurs / rubs /  gallops. No extremity edema. 2+ pedal pulses. No carotid bruits.  Abdomen: no tenderness, no masses palpated. No hepatosplenomegaly. Bowel sounds positive.  Musculoskeletal: no clubbing / cyanosis. No joint deformity upper and lower extremities. Good ROM, no contractures. Normal muscle tone.  Skin: no rashes, lesions, ulcers. No induration Neurologic: CN 2-12 grossly intact. Sensation intact, DTR normal. Strength 5/5 in all 4.  Psychiatric: Normal judgment and insight. Alert and oriented x 4. Normal mood.     Labs on Admission: I have personally reviewed following labs and imaging studies  CBC:  Recent Labs Lab 01/10/16 1644  WBC 6.6  NEUTROABS 3.0  HGB 12.7  HCT 39.3  MCV 81.7  PLT 292   Basic Metabolic Panel:  Recent Labs Lab 01/10/16 1416 01/10/16 1644  NA 138 138  K 3.9 3.8  CL 103 105  CO2 26 25  GLUCOSE 167* 133*  BUN 11 10  CREATININE 1.07 1.00  CALCIUM 9.8 9.6   GFR: Estimated Creatinine Clearance: 39.4 mL/min (by C-G formula based on Cr of 1). Liver Function Tests:  Recent Labs Lab 01/10/16 1644  AST 22  ALT 14  ALKPHOS 59  BILITOT 0.6  PROT 7.4  ALBUMIN 3.8   Coagulation Profile:  Recent Labs Lab 01/10/16 1644  INR 1.08   HbA1C:  Recent Labs  01/10/16 1416  HGBA1C 11.5*   Lipid Profile:  Recent Labs  01/10/16 1416  CHOL 242*  HDL 33.60*  TRIG 212.0*  CHOLHDL 7  LDLDIRECT 141.0   Thyroid Function Tests:  Recent Labs  01/10/16 1416  TSH 0.31*   Urine analysis:    Component Value Date/Time   COLORURINE YELLOW 01/10/2016 1416   APPEARANCEUR Sl Cloudy* 01/10/2016 1416   LABSPEC 1.010 01/10/2016 1416   PHURINE 5.5 01/10/2016 1416   GLUCOSEU NEGATIVE 01/10/2016 1416   GLUCOSEU >1000* 12/31/2015 2325   HGBUR NEGATIVE 01/10/2016 1416   BILIRUBINUR NEGATIVE 01/10/2016 1416   KETONESUR NEGATIVE 01/10/2016 1416   PROTEINUR NEGATIVE 12/31/2015 2325   UROBILINOGEN 0.2 01/10/2016 1416   NITRITE NEGATIVE 01/10/2016 1416    LEUKOCYTESUR MODERATE* 01/10/2016 1416    Recent Results (from the past 240 hour(s))  Urine culture     Status: None   Collection Time: 12/31/15 11:25 PM  Result Value Ref Range Status   Specimen Description URINE, CATHETERIZED  Final   Special Requests NONE  Final   Culture NO GROWTH Performed at [facility]   Final   Report Status 01/02/2016 FINAL  Final     Radiological Exams on Admission: Dg Chest 2 View  01/10/2016  CLINICAL DATA:  Dizziness and lethargy EXAM: CHEST  2 VIEW COMPARISON:  Jan 01, 2016 FINDINGS: The pacemaker is stable. Mild vascular crowding in the medial right lung base. The heart, hila, mediastinum, lungs, and pleura are otherwise stable. Anterior wedging of 2 lower thoracic vertebral bodies. Only 1 of these vertebral bodies was involved in February of 2015. IMPRESSION: Anterior wedging of a lower thoracic vertebral body, new since February 2015 but otherwise age indeterminate. Recommend clinical correlation. No other acute abnormalities. Electronically Signed   By: Gerome Sam III M.D   On: 01/10/2016 22:15   Ct Head Wo Contrast  01/10/2016  CLINICAL DATA:  Right-sided headaches and ataxia for 3 weeks. Concern for stroke. EXAM: CT HEAD WITHOUT CONTRAST TECHNIQUE: Contiguous axial images were obtained from the base of the skull through the vertex without intravenous contrast. COMPARISON:  None. FINDINGS: There is cytotoxic edema in the medial right occipital lobe and posterior right temporal lobe consistent with a moderate-sized, likely subacute PCA infarct. There is no evidence of intracranial hemorrhage, midline shift, mass, or extra-axial fluid collection. There is mild global cerebral atrophy. Periventricular white-matter hypodensities are nonspecific but compatible with mild chronic small vessel ischemic disease. No acute abnormality identified in the imaged orbits. The visualized paranasal sinuses and mastoid air cells are clear. Calcified atherosclerosis  is noted at the skullbase. No acute osseous abnormality. IMPRESSION: 1. Moderate-sized subacute right PCA infarct. No evidence of hemorrhage. 2. Mild chronic small vessel ischemic disease and cerebral atrophy. These results will be called to the ordering clinician or representative by the Radiologist  Assistant, and communication documented in the PACS or zVision Dashboard. Electronically Signed   By: Allen  Grady MSebastian Ache 01/10/2016 15:44    EKG: Independently reviewed. Vent. 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 Frequent neuro checks. Check fasting lipids and hemoglobin A1c. CT angiogram of head and neck as suggested by neurology. Aspirin once patient passes swallow test. Consult PT and OT in a.m.  Active Problems:   Hypothyroidism Continue levothyroxine 137 g by mouth daily. Monitor TSH periodically.    Hyperlipidemia with target LDL less than 70 Continue atorvastatin. Monitor LFTs periodically.    HTN (hypertension) Continue carvedilol 12.5 mg by mouth twice a day. Continue lisinopril 5 mg by mouth twice a day. Monitor blood pressure periodically.    Chronic systolic heart failure (HCC) Compensated. Continue carvedilol, lisinopril and furosemide as needed.    Type 2 diabetes mellitus with diabetic neuropathy, with long-term current use of insulin (HCC) Carbohydrate modified diet. Continue Lantus 14 units SQ in the morning if the patient passes swallow test. CBG monitoring with regular insulin sliding scale.      DVT prophylaxis:  Lovenox SQ. Code Status: Full code. Family Communication:  Disposition Plan: Admit for further evaluation and workup. Consults called: Neurology (Dr. Noel Christmas) Admission status: Inpatient/telemetry   Bobette Mo MD [facility] Pager [phone].  If 7PM-7AM, please contact night-coverage [email or web address] Password [facility]  01/10/2016, 10:24 PM

## 2016-01-10 NOTE — ED Notes (Signed)
Patient transported to MRI 

## 2016-01-10 NOTE — ED Notes (Signed)
Pt here after having CT head today showing subacute right PCA stroke; pt c/o dizziness and lethargy x 10 days

## 2016-01-10 NOTE — ED Notes (Signed)
Patient transported to X-ray 

## 2016-01-10 NOTE — Patient Instructions (Signed)
Ataxia °Ataxia is a condition that results in unsteadiness when walking and standing, poor coordination of body movements, and difficulty maintaining an upright posture. It occurs due to a problem with the part of your brain that controls coordination and stability (cerebellar dysfunction).  °CAUSES  °Ataxia can develop later in life (acquired ataxia) during your 20s to 30s, and even as late as into your 60s or beyond. Acquired ataxia may be caused by: °· Changes in your nervous system (neurodegenerative). °· Changes throughout your body (systemic disorders). °· Excess exposure to: °¨ Medicines, such as phenytoin and lithium. °¨ Solvents. °¨ Abuse of alcohol (alcoholism). °· Medical conditions, such as: °¨ Celiac sprue. °¨ Hypothyroidism. °¨ Vitamin E deficiency. °¨ Structural brain abnormalities, such as tumors. °¨ Multiple sclerosis. °¨ Stroke. °¨ Head injury. °Ataxia may also be present early in life (non-acquired ataxia). There are two main types of non-acquired ataxia: °· Cerebellar dysfunction present at birth (congenital). °· Family inheritance (genetic heredity). Friedreich ataxia is the most common form of hereditary ataxia. °SIGNS AND SYMPTOMS °The signs and symptoms of ataxia can vary depending on how severe the condition is that causes it. Signs and symptoms may include: °· Unsteadiness. °· Walking with a wide stance. °· Tremor. °· Poorly coordinated body movements. °· Difficulty maintaining a straight (upright) posture. °· Fatigue. °· Changes in your speech. °· Changes in your vision. °· Difficulty swallowing. °· Difficulty with writing. °· Decreased mental status (dementia). °· Muscle spasms. °DIAGNOSIS  °Ataxia is diagnosed by discussing your personal and family history and through a physical exam. You may also have additional tests such as: °· MRI. °· Genetic testing. °TREATMENT  °Treatment for ataxia may include treating or removing the underlying condition causing the ataxia. Surgery may be  required if a structural abnormality in your brain is causing the ataxia. Otherwise, supportive treatments may be used to manage your symptoms. °HOME CARE INSTRUCTIONS °Monitor your ataxia for any changes. The following actions may help any discomfort you are experiencing:  °· Do not drink alcohol. °· Lie down right away if you become very unsteady, dizzy, nauseated, or feel like you are going to faint. Wait until all of these feelings pass before you get up again. °SEEK IMMEDIATE MEDICAL CARE IF: °· Your unsteadiness suddenly worsens. °· You develop severe headaches, chest pain, or abdominal pain.   °· You have weakness or numbness on one side of your body.   °· You have problems with your vision.   °· You feel confused.   °· You have difficulty speaking.   °· You have an irregular heartbeat or a very fast pulse.   °  °This information is not intended to replace advice given to you by your health care provider. Make sure you discuss any questions you have with your health care provider. °  °Document Released: 02/23/2014 Document Reviewed: 02/23/2014 °Elsevier Interactive Patient Education ©2016 [employer] ° °

## 2016-01-10 NOTE — ED Provider Notes (Signed)
CSN: [id number]     Arrival date & time 01/10/16  1627 History   First MD Initiated Contact with Patient 01/10/16 1815     Chief Complaint  Patient presents with  . Stroke Symptoms     (Consider location/radiation/quality/duration/timing/severity/associated sxs/prior Treatment) HPI Comments: [name] [name] is a 82 y.o. female with a PMHx of hypothyroidism, HLD, CHF, congestive dilated cardiomyopathy, LBBB, DM2, GERD, CAD, and a PSHx of SJ pacemaker insertion, who presents to the ED with complaints of 2 weeks of gradual decline, dizziness, intermittent right-sided headaches, and "walking like she is drunk". She was seen at her primary care doctor's office this morning, had a CT scan of her head which showed a subacute right sided PCA stroke, therefore she was sent to the ED for further evaluation. Patient and her family deny any known history of stroke prior to today. Her family states that they thought her symptoms were related to her blood sugars or her blood pressure medications, but despite her BP meds and DM2 meds changing they still noted that she was slower to respond, although they deny that she's been confused. Pt states she's had constant dizziness which she describes as lightheaded feeling, which has no known aggravating or alleviating factors and doesn't change with activity or head movement. She also reports she's had intermittent R sided headaches, but this is currently resolved. Reports that the ataxia has also been ongoing x2 weeks, and describes it as "walking like I'm drunk".   She denies any fevers, chills, chest pain, shortness breath, abdominal pain, nausea vomiting, diarrhea constipation dysuria, hematuria, numbness, tingling, focal weakness, vision changes, tinnitus, or vertigo. Family denies any confusion but rather they state that she has just been overall slow to respond and not acting normally. Denies being on any blood thinners, nonsmoker. Of note, she reports she's had a tremor  in her R arm which is chronic and unchanged.   Patient is a 82 y.o. female presenting with Acute Neurological Problem. The history is provided by the patient, medical records and a relative. No language interpreter was used.  Cerebrovascular Accident This is a new problem. The current episode started 1 to 4 weeks ago. The problem occurs constantly. The problem has been unchanged. Associated symptoms include headaches (intermittent, none ongoing). Pertinent negatives include no abdominal pain, arthralgias, chest pain, chills, fever, myalgias, nausea, numbness, urinary symptoms, vertigo, visual change, vomiting or weakness. Nothing aggravates the symptoms. She has tried nothing for the symptoms. The treatment provided no relief.    Past Medical History  Diagnosis Date  . Hypothyroidism   . High cholesterol   . Osteoarthritis   . Chronic combined systolic and diastolic CHF, NYHA class 3 (HCC)     a. 03/2013 Echo: EF 25-30%, diff HK, sev antsept HK, mild MR.  . Congestive dilated cardiomyopathy (HCC)     a. 03/2013 Echo: EF 25-30%;  b. 09/2013 s/p SJM 3242 CRT-P.  Marland Kitchen Left bundle branch block   . Type II diabetes mellitus (HCC)   . GERD (gastroesophageal reflux disease)   . Coronary artery disease     a. 08/2012 Cath: LM nl, LAD 56m, LCX nondom, mild-mod nonobs dzs mid, OM1 ok, OM2 90/90p, RCA 40, EF 25-30%-->Med Rx.   Past Surgical History  Procedure Laterality Date  . Abdominal hysterectomy  1980  . Bmd  2006  . Bilateral cateract surgery    . Bi-ventricular pacemaker insertion (crt-p)      a. 09/2013 s/p SJM 3242 CRT-P.  . Orif tibia  plateau Right 12/04/2013    Procedure: OPEN REDUCTION INTERNAL FIXATION (ORIF) TIBIAL PLATEAU;  Surgeon: Verlee Rossetti, MD;  Location: [facility]S;  Service: Orthopedics;  Laterality: Right;  . Right heart catheterization N/A 09/08/2012    Procedure: RIGHT HEART CATH;  Surgeon: Dolores Patty, MD;  Location: [facility] CATH LAB;  Service: Cardiovascular;  Laterality: N/A;   . Bi-ventricular pacemaker insertion N/A 10/01/2013    Procedure: BI-VENTRICULAR PACEMAKER INSERTION (CRT-P);  Surgeon: Duke Salvia, MD;  Location: [facility] CATH LAB;  Service: Cardiovascular;  Laterality: N/A;   Family History  Problem Relation Age of Onset  . Diabetes Other   . Hypertension Other   . Uterine cancer Other   . Diabetes Mother   . Heart attack Brother    Social History  Substance Use Topics  . Smoking status: Former Games developer  . Smokeless tobacco: Never Used  . Alcohol Use: No   OB History    No data available     Review of Systems  Constitutional: Negative for fever and chills.  HENT: Negative for tinnitus.   Eyes: Negative for visual disturbance.  Respiratory: Negative for shortness of breath.   Cardiovascular: Negative for chest pain.  Gastrointestinal: Negative for nausea, vomiting, abdominal pain, diarrhea and constipation.  Genitourinary: Negative for dysuria and hematuria.  Musculoskeletal: Positive for gait problem ("walking like I'm drunk"). Negative for myalgias and arthralgias.  Skin: Negative for color change.  Allergic/Immunologic: Positive for immunocompromised state (diabetic).  Neurological: Positive for tremors (chronic R arm tremor, unchanged), light-headedness and headaches (intermittent, none ongoing). Negative for vertigo, weakness and numbness.  Psychiatric/Behavioral: Negative for confusion (per family, not confused, but slower to respond).   10 Systems reviewed and are negative for acute change except as noted in the HPI.    Allergies  Statins; Metformin and related; and Vytorin  Home Medications   Prior to Admission medications   Medication Sig Start Date End Date Taking? Authorizing Provider  aspirin EC 81 MG EC tablet Take 1 tablet (81 mg total) by mouth daily. 08/07/12   Elease Etienne, MD  atorvastatin (LIPITOR) 10 MG tablet Take 40 mg by mouth once a week.     Historical Provider, MD  carvedilol (COREG) 12.5 MG tablet TAKE 1  TABLET TWICE A DAY WITH MEALS    Aundria Rud, NP  furosemide (LASIX) 20 MG tablet Take 20 mg by mouth daily as needed for fluid.     Historical Provider, MD  glipiZIDE (GLUCOTROL) 10 MG tablet TAKE 1 TABLET TWICE A DAY BEFORE MEALS 10/06/15   Carlus Pavlov, MD  glucose blood (ACCU-CHEK AVIVA PLUS) test strip 1 each by Other route 2 (two) times daily. Use to check blood sugars twice a day Dx E11.9 01/04/16   Aleksei Plotnikov V, MD  glucose blood (ONETOUCH VERIO) test strip Use to test blood sugar once daily as instructed. Dx: E11.40 10/10/15   Carlus Pavlov, MD  glucose blood test strip 1 each by Other route 2 (two) times daily. Dx E11.9 01/05/16   Tresa Garter, MD  HYDROcodone-acetaminophen (NORCO/VICODIN) 5-325 MG tablet Take 1 tablet by mouth every 4 hours as needed *DO NOT EXCEED 3 GM APAP/24 HRS* 07/12/15   [name], MD  Insulin Glargine (LANTUS SOLOSTAR) 100 UNIT/ML Solostar Pen Inject 14 Units into the skin every morning. 01/03/16   Tresa Garter, MD  Insulin Pen Needle 32G X 4 MM MISC Use 1x a day 05/03/15   Carlus Pavlov, MD  levothyroxine (  SYNTHROID, LEVOTHROID) 137 MCG tablet Take 1 tablet (137 mcg total) by mouth daily before breakfast. 10/11/15   Carlus Pavlov, MD  lisinopril (PRINIVIL,ZESTRIL) 5 MG tablet Take 1 tablet (5 mg total) by mouth 2 (two) times daily. 08/29/15   Dolores Patty, MD  omeprazole (PRILOSEC) 20 MG capsule Take 20 mg by mouth daily as needed (for acid reflux/heartburn.).     Historical Provider, MD  [facility] DELICA LANCETS FINE MISC Use to test blood sugar 1 time daily. Dx: E11.40 10/10/15   Carlus Pavlov, MD  Vitamin D, Ergocalciferol, (DRISDOL) 50000 units CAPS capsule TAKE 1 CAPSULE EVERY 7 DAYS ON MONDAYS 08/17/15   [name], MD   BP 170/70 mmHg  Pulse 62  Temp(Src) 98.2 F (36.8 C) (Oral)  Resp 18  SpO2 100%   Physical Exam  Constitutional: She is oriented to person, place, and time. Vital signs are normal. She appears  well-developed and well-nourished.  Non-toxic appearance. No distress.  Afebrile, nontoxic, NAD, HTN noted similar to prior visits  HENT:  Head: Normocephalic and atraumatic.  Mouth/Throat: Oropharynx is clear and moist and mucous membranes are normal.  Eyes: Conjunctivae and EOM are normal. Pupils are equal, round, and reactive to light. Right eye exhibits no discharge. Left eye exhibits no discharge.  PERRL, EOMI, no nystagmus, no visual field deficits   Neck: Normal range of motion. Neck supple. No spinous process tenderness and no muscular tenderness present. No rigidity. Normal range of motion present.  FROM intact without spinous process TTP, no bony stepoffs or deformities, no paraspinous muscle TTP or muscle spasms. No rigidity or meningeal signs. No bruising or swelling.   Cardiovascular: Normal rate, regular rhythm, normal heart sounds and intact distal pulses.  Exam reveals no gallop and no friction rub.   No murmur heard. Pulmonary/Chest: Effort normal and breath sounds normal. No respiratory distress. She has no decreased breath sounds. She has no wheezes. She has no rhonchi. She has no rales.  Abdominal: Soft. Normal appearance and bowel sounds are normal. She exhibits no distension. There is no tenderness. There is no rigidity, no rebound, no guarding, no CVA tenderness, no tenderness at McBurney's point and negative Murphy's sign.  Musculoskeletal: Normal range of motion.  MAE x4 Strength and sensation grossly intact in all extremities Tremor in R arm noted which pt states is chronic and unchanged Distal pulses intact Gait not assessed due to pt dizziness/condition  Neurological: She is alert and oriented to person, place, and time. She has normal strength. She displays tremor. No cranial nerve deficit or sensory deficit. Coordination abnormal. GCS eye subscore is 4. GCS verbal subscore is 5. GCS motor subscore is 6.  CN 2-12 grossly intact A&O x4 although somewhat slow to  respond GCS 15 Sensation and strength intact Gait not assessed due to condition/dizziness Coordination with finger-to-nose somewhat altered/delayed on the L side, able to complete it but not as fluid as when she completes it on the R side Neg pronator drift  R arm tremor at baseline per pt  Skin: Skin is warm, dry and intact. No rash noted.  Psychiatric: She has a normal mood and affect.  Nursing note and vitals reviewed.   ED Course  Procedures (including critical care time) Labs Review Labs Reviewed  COMPREHENSIVE METABOLIC PANEL - Abnormal; Notable for the following:    Glucose, Bld 133 (*)    GFR calc non Af Amer 52 (*)    All other components within normal limits  PROTIME-INR  APTT  CBC  DIFFERENTIAL  I-STAT TROPOININ, ED    Imaging Review Ct Head Wo Contrast  01/10/2016  CLINICAL DATA:  Right-sided headaches and ataxia for 3 weeks. Concern for stroke. EXAM: CT HEAD WITHOUT CONTRAST TECHNIQUE: Contiguous axial images were obtained from the base of the skull through the vertex without intravenous contrast. COMPARISON:  None. FINDINGS: There is cytotoxic edema in the medial right occipital lobe and posterior right temporal lobe consistent with a moderate-sized, likely subacute PCA infarct. There is no evidence of intracranial hemorrhage, midline shift, mass, or extra-axial fluid collection. There is mild global cerebral atrophy. Periventricular white-matter hypodensities are nonspecific but compatible with mild chronic small vessel ischemic disease. No acute abnormality identified in the imaged orbits. The visualized paranasal sinuses and mastoid air cells are clear. Calcified atherosclerosis is noted at the skullbase. No acute osseous abnormality. IMPRESSION: 1. Moderate-sized subacute right PCA infarct. No evidence of hemorrhage. 2. Mild chronic small vessel ischemic disease and cerebral atrophy. These results will be called to the ordering clinician or representative by the  Radiologist Assistant, and communication documented in the PACS or zVision Dashboard. Electronically Signed   By: Sebastian Ache M.D.   On: 01/10/2016 15:44   I have personally reviewed and evaluated these images and lab results as part of my medical decision-making.   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 female here with 2wks of gradually worsening overall well-being, dizziness/lightheadedness, intermittent HA (not ongoing), and ataxic gait; sent by her PCP after she had a CT head performed today which showed subacute R PCA stroke. No prior strokes that she's aware of. On exam, R arm tremor noted; some difficulty with finger-to-nose coordination especially on the left side, somewhat slow to respond but A&O x4. No pronator drift and all extremities NVI. Labs overall unremarkable, will obtain EKG. Will consult neurology for admission for stroke work up, especially since this is her first event and she would need the work up to find out what caused her stroke and discuss prevention for future strokes. Discussed case with my attending Dr. Madilyn Hook who agrees with plan and will see pt.   6:51 PM Dr. Lavon Paganini of neurology returning page, requests that I call Dr. Roseanne Reno in 10 minutes once he comes in for his shift. Gave me  the direct number to call at 7pm.  7:41 PM Multiple attempts made to call the number given to me by Dr. Lavon Paganini, with no answer. Re-paged neurology and Dr. Roseanne Reno returned the page now, asks that we get an MRI brain w/o contrast now, and he will consult on the pt. Does not want her to be admitted at this time until the MRI brain returns, and stated that if she ultimately needs admission it would need to be a medicine/hospitalist admission. Will order MRI now, and  hold off on any further orders/admission. Of note, EKG showing nonspecific IVCD and nonspecific T wave changes, without other acute findings. Pt and family updated and have no questions at this time.   7:57 PM Care signed out to Earley Favor NP at shift change, who will follow up on MRI/neuro consult and likely admit for her CVA work up. Please see her notes for further documentation of care/dispo.   BP 170/70 mmHg  Pulse 62  Temp(Src) 98.3 F (36.8 C) (Oral)  Resp 18  SpO2 100%  No orders of the defined types were placed in this encounter.     [address] [place], PA-C 01/10/16 1958  Tilden Fossa, MD 01/12/16 [phone]

## 2016-01-10 NOTE — Telephone Encounter (Signed)
Apologizes:   Typo below. Son was told CVA

## 2016-01-10 NOTE — Telephone Encounter (Signed)
[name] from [facility] called and stated that results were in.   PCP requested for pt family to be contacted as soon as possible.   Pt son [name] [name] been informed of CT scan results. Results showed TIA and other ischemic processes.   Informed [name] [name] that pt should be taken to the ED as soon as safely possible for additional evaluation of sx.   Son stated understanding and will comply.

## 2016-01-10 NOTE — Progress Notes (Signed)
Subjective:  Patient ID: [name], female    DOB: 1933/08/20  Age: 82 y.o. MRN: [id number]  CC: Hypertension; Hyperlipidemia; Hypothyroidism; and Diabetes   HPI Owynn Benenhaley presents for evaluation of an acute decline that started about 2 weeks ago. She has been seen during that time in the emergency room and later here in this office by another provider about a week ago. She complains of dizziness, ataxia, intermittent right-sided headache, sending tremors, and feeling disoriented.  Outpatient Prescriptions Prior to Visit  Medication Sig Dispense Refill  . aspirin EC 81 MG EC tablet Take 1 tablet (81 mg total) by mouth daily. 30 tablet 0  . atorvastatin (LIPITOR) 10 MG tablet Take 40 mg by mouth once a week.     . carvedilol (COREG) 12.5 MG tablet TAKE 1 TABLET TWICE A DAY WITH MEALS 180 tablet 5  . furosemide (LASIX) 20 MG tablet Take 20 mg by mouth daily as needed for fluid.     Marland Kitchen glipiZIDE (GLUCOTROL) 10 MG tablet TAKE 1 TABLET TWICE A DAY BEFORE MEALS 180 tablet 1  . glucose blood (ACCU-CHEK AVIVA PLUS) test strip 1 each by Other route 2 (two) times daily. Use to check blood sugars twice a day Dx E11.9 50 each 0  . glucose blood (ONETOUCH VERIO) test strip Use to test blood sugar once daily as instructed. Dx: E11.40 100 each 3  . glucose blood test strip 1 each by Other route 2 (two) times daily. Dx E11.9 50 each 1  . HYDROcodone-acetaminophen (NORCO/VICODIN) 5-325 MG tablet Take 1 tablet by mouth every 4 hours as needed *DO NOT EXCEED 3 GM APAP/24 HRS* 60 tablet 0  . Insulin Glargine (LANTUS SOLOSTAR) 100 UNIT/ML Solostar Pen Inject 14 Units into the skin every morning. 15 mL 10  . Insulin Pen Needle 32G X 4 MM MISC Use 1x a day 100 each 1  . levothyroxine (SYNTHROID, LEVOTHROID) 137 MCG tablet Take 1 tablet (137 mcg total) by mouth daily before breakfast. 90 tablet 1  . lisinopril (PRINIVIL,ZESTRIL) 5 MG tablet Take 1 tablet (5 mg total) by mouth 2 (two) times daily. 180 tablet  3  . omeprazole (PRILOSEC) 20 MG capsule Take 20 mg by mouth daily as needed (for acid reflux/heartburn.).     Marland Kitchen ONETOUCH DELICA LANCETS FINE MISC Use to test blood sugar 1 time daily. Dx: E11.40 100 each 3  . Vitamin D, Ergocalciferol, (DRISDOL) 50000 units CAPS capsule TAKE 1 CAPSULE EVERY 7 DAYS ON MONDAYS 12 capsule 2   No facility-administered medications prior to visit.    ROS Review of Systems  Constitutional: Negative for fever, chills, diaphoresis, appetite change and fatigue.  HENT: Negative for trouble swallowing.   Eyes: Negative.  Negative for visual disturbance.  Respiratory: Negative.  Negative for cough, choking, chest tightness, shortness of breath and stridor.   Cardiovascular: Negative.  Negative for chest pain, palpitations and leg swelling.  Gastrointestinal: Negative.  Negative for abdominal pain.  Endocrine: Negative.   Genitourinary: Negative.  Negative for dysuria, flank pain and difficulty urinating.  Musculoskeletal: Positive for gait problem. Negative for neck pain.  Skin: Negative.   Allergic/Immunologic: Negative.   Neurological: Positive for dizziness, tremors, weakness and headaches. Negative for syncope, facial asymmetry, light-headedness and numbness.  Hematological: Negative.  Negative for adenopathy. Does not bruise/bleed easily.  Psychiatric/Behavioral: Negative.     Objective:  BP 142/88 mmHg  Pulse 60  Temp(Src) 97.9 F (36.6 C) (Oral)  Resp 16  Ht 5\' 4"  (1.626  m)  Wt 138 lb (62.596 kg)  BMI 23.68 kg/m2  SpO2 98%  BP Readings from Last 3 Encounters:  01/10/16 142/88  01/03/16 164/96  01/01/16 146/67    Wt Readings from Last 3 Encounters:  01/10/16 138 lb (62.596 kg)  01/03/16 138 lb (62.596 kg)  12/27/15 137 lb 12.8 oz (62.506 kg)    Physical Exam  Constitutional: She is oriented to person, place, and time. She appears well-developed and well-nourished. No distress.  HENT:  Mouth/Throat: Oropharynx is clear and moist. No  oropharyngeal exudate.  Eyes: Conjunctivae are normal. Right eye exhibits no discharge. Left eye exhibits no discharge. No scleral icterus.  Neck: Normal range of motion. Neck supple. No JVD present. No tracheal deviation present. No thyromegaly present.  Cardiovascular: Normal rate, regular rhythm, normal heart sounds and intact distal pulses.  Exam reveals no gallop and no friction rub.   No murmur heard. Pulmonary/Chest: Effort normal and breath sounds normal. No stridor. No respiratory distress. She has no wheezes. She has no rales. She exhibits no tenderness.  Abdominal: Soft. Bowel sounds are normal. She exhibits no distension and no mass. There is no tenderness. There is no rebound and no guarding.  Musculoskeletal: Normal range of motion. She exhibits no edema or tenderness.  Lymphadenopathy:    She has no cervical adenopathy.  Neurological: She is alert and oriented to person, place, and time. She has normal strength. She displays tremor. She displays no atrophy. No cranial nerve deficit or sensory deficit. She exhibits abnormal muscle tone. She displays no seizure activity. Coordination and gait abnormal.  Skin: Skin is warm and dry. No rash noted. She is not diaphoretic. No erythema. No pallor.  Psychiatric: She has a normal mood and affect. Thought content normal. Her speech is delayed and tangential. She is slowed. She is not withdrawn. Cognition and memory are impaired. She exhibits abnormal recent memory and abnormal remote memory. She is inattentive.  Vitals reviewed.   Lab Results  Component Value Date   WBC 6.5 12/31/2015   HGB 12.7 12/31/2015   HCT 38.2 12/31/2015   PLT 253 12/31/2015   GLUCOSE 167* 01/10/2016   CHOL 242* 01/10/2016   TRIG 212.0* 01/10/2016   HDL 33.60* 01/10/2016   LDLDIRECT 141.0 01/10/2016   LDLCALC 70 12/06/2013   ALT 14 12/31/2015   AST 20 12/31/2015   NA 138 01/10/2016   K 3.9 01/10/2016   CL 103 01/10/2016   CREATININE 1.07 01/10/2016   BUN  11 01/10/2016   CO2 26 01/10/2016   TSH 0.31* 01/10/2016   INR 0.98 12/03/2013   HGBA1C 11.5* 01/10/2016   MICROALBUR 3.6* 01/10/2016    Dg Chest Portable 1 View  01/01/2016  CLINICAL DATA:  Acute onset of dizziness and lightheadedness. Hyperglycemia. Initial encounter. EXAM: PORTABLE CHEST 1 VIEW COMPARISON:  Chest radiograph performed 12/03/2013 FINDINGS: The lungs are well-aerated and clear. There is no evidence of focal opacification, pleural effusion or pneumothorax. The cardiomediastinal silhouette is borderline normal in size. A pacemaker is noted overlying the left chest wall, with leads ending overlying the right atrium, right ventricle and coronary sinus. No acute osseous abnormalities are seen. IMPRESSION: No acute cardiopulmonary process seen. Electronically Signed   By: Roanna Raider M.D.   On: 01/01/2016 01:38   Ct Head Wo Contrast  01/10/2016  CLINICAL DATA:  Right-sided headaches and ataxia for 3 weeks. Concern for stroke. EXAM: CT HEAD WITHOUT CONTRAST TECHNIQUE: Contiguous axial images were obtained from the base of the skull  through the vertex without intravenous contrast. COMPARISON:  None. FINDINGS: There is cytotoxic edema in the medial right occipital lobe and posterior right temporal lobe consistent with a moderate-sized, likely subacute PCA infarct. There is no evidence of intracranial hemorrhage, midline shift, mass, or extra-axial fluid collection. There is mild global cerebral atrophy. Periventricular white-matter hypodensities are nonspecific but compatible with mild chronic small vessel ischemic disease. No acute abnormality identified in the imaged orbits. The visualized paranasal sinuses and mastoid air cells are clear. Calcified atherosclerosis is noted at the skullbase. No acute osseous abnormality. IMPRESSION: 1. Moderate-sized subacute right PCA infarct. No evidence of hemorrhage. 2. Mild chronic small vessel ischemic disease and cerebral atrophy. These results will  be called to the ordering clinician or representative by the Radiologist Assistant, and communication documented in the PACS or zVision Dashboard. Electronically Signed   By: Sebastian Ache M.D.   On: 01/10/2016 15:44     Assessment & Plan:   Shaquina was seen today for hypertension, hyperlipidemia, hypothyroidism and diabetes.  Diagnoses and all orders for this visit:  Essential hypertension- her blood pressure is adequately well controlled, electrolytes and renal function are stable. -     Basic metabolic panel; Future -     Urinalysis, Routine w reflex microscopic (not at [facility]); Future -     Drugs of abuse screen w/o alc, rtn urine-sln; Future  Diabetes mellitus due to underlying condition with diabetic neuropathy, without long-term current use of insulin (HCC) -     POCT Glucose (Device for Home Use)  Type 2 diabetes mellitus with diabetic neuropathy, with long-term current use of insulin (HCC)- her blood sugars are not well-controlled -     Hemoglobin A1c; Future -     Microalbumin / creatinine urine ratio; Future  Other specified hypothyroidism- her TSH is suppressed, she will need an adjustment in her levothyroxine dose -     TSH; Future  Hyperlipidemia with target LDL less than 70 -     Lipid panel; Future  Hypertriglyceridemia, essential -     Lipid panel; Future  Ataxia- CT scan is positive for CVA -     CT Head Wo Contrast; Future -     Drugs of abuse screen w/o alc, rtn urine-sln; Future  Cerebral infarction due to thrombosis of right posterior cerebral artery (HCC)- her CT scan confirms that she has had a moderate size subacute right PCA infarct, she appears to be unstable so I've asked her son to take her to the emergency room for urgent evaluation and treatment.   I am having Ms. Sudberry maintain her aspirin, carvedilol, atorvastatin, Insulin Pen Needle, HYDROcodone-acetaminophen, Vitamin D (Ergocalciferol), furosemide, omeprazole, lisinopril, glipiZIDE, glucose blood,  ONETOUCH DELICA LANCETS FINE, levothyroxine, Insulin Glargine, glucose blood, and glucose blood.  No orders of the defined types were placed in this encounter.     Follow-up: Return in about 1 week (around 01/17/2016).  Sanda Linger, MD

## 2016-01-10 NOTE — Consult Note (Signed)
Admission H&P    Chief Complaint: Intermittent dizziness and unsteady ambulation for 2 weeks.  HPI: [name] [name] is an 82 y.o. female history diabetes mellitus, hypertension, hyperlipidemia, coronary artery disease, hypothyroidism and CHF, presenting with recurrent episodes of dizziness and unsteady gait for 2 weeks. She describes the dizziness as a drunk feeling she gets when she stands and attempt to walk. No change in speech as been noted. Family has noted a droop of the left side of her face. She has not experienced focal weakness of extremities, nor numbness. No significant mental status changes of the noted other than just not being herself over the past 2 weeks. CT scan of her head was obtained today which showed right probably subacute ischemic stroke involving the PCA territory. No cerebellar or brainstem acute lesion was noted. MRI could not be obtained because patient has an implanted pacemaker. NIH stroke score was 4.  LSN: 12/27/2015 tPA Given: No: Beyond time window for treatment consideration mRankin:  Past Medical History  Diagnosis Date  . Hypothyroidism   . High cholesterol   . Osteoarthritis   . Chronic combined systolic and diastolic CHF, NYHA class 3 ([place])     a. 03/2013 Echo: EF 25-30%, diff HK, sev antsept HK, mild MR.  . Congestive dilated cardiomyopathy ([employer])     a. 03/2013 Echo: EF 25-30%;  b. 09/2013 s/p SJM 3242 CRT-P.  Marland Kitchen Left bundle branch block   . Type II diabetes mellitus ([place])   . GERD (gastroesophageal reflux disease)   . Coronary artery disease     a. 08/2012 Cath: LM nl, LAD 53m LCX nondom, mild-mod nonobs dzs mid, OM1 ok, OM2 90/90p, RCA 40, EF 25-30%-->Med Rx.    Past Surgical History  Procedure Laterality Date  . Abdominal hysterectomy  1980  . Bmd  2006  . Bilateral cateract surgery    . Bi-ventricular pacemaker insertion (crt-p)      a. 09/2013 s/p SJM 3242 CRT-P.  . Orif tibia plateau Right 12/04/2013    Procedure: OPEN REDUCTION INTERNAL  FIXATION (ORIF) TIBIAL PLATEAU;  Surgeon: SAugustin Schooling MD;  Location: [facility] ORS;  Service: Orthopedics;  Laterality: Right;  . Right heart catheterization N/A 09/08/2012    Procedure: RIGHT HEART CATH;  Surgeon: DJolaine Artist MD;  [facility];  Service: Cardiovascular;  Laterality: N/A;  . Bi-ventricular pacemaker insertion N/A 10/01/2013    Procedure: BI-VENTRICULAR PACEMAKER INSERTION (CRT-P);  Surgeon: SDeboraha Sprang MD;  [facility];  Service: Cardiovascular;  Laterality: N/A;    Family History  Problem Relation Age of Onset  . Diabetes Other   . Hypertension Other   . Uterine cancer Other   . Diabetes Mother   . Heart attack Brother    Social History:  reports that she has quit smoking. She has never used smokeless tobacco. She reports that she does not drink alcohol or use illicit drugs.  Allergies:  Allergies  Allergen Reactions  . Statins Other (See Comments)    Leg cramping  . Metformin And Related     Pt stopped it due to diarrhea  . Vytorin [Ezetimibe-Simvastatin] Other (See Comments)    Leg cramps    Medications: Preadmission medications were reviewed by me.  ROS: History obtained from patient and her son.  General ROS: negative for - chills, fatigue, fever, night sweats, weight gain or weight loss Psychological ROS: negative for - behavioral disorder, hallucinations, memory difficulties, mood swings or suicidal ideation Ophthalmic ROS: negative for -  blurry vision, double vision, eye pain or loss of vision ENT ROS: negative for - epistaxis, nasal discharge, oral lesions, sore throat, tinnitus or vertigo Allergy and Immunology ROS: negative for - hives or itchy/watery eyes Hematological and Lymphatic ROS: negative for - bleeding problems, bruising or swollen lymph nodes Endocrine ROS: negative for - galactorrhea, hair pattern changes, polydipsia/polyuria or temperature intolerance Respiratory ROS: negative for - cough, hemoptysis,  shortness of breath or wheezing Cardiovascular ROS: negative for - chest pain, dyspnea on exertion, edema or irregular heartbeat Gastrointestinal ROS: negative for - abdominal pain, diarrhea, hematemesis, nausea/vomiting or stool incontinence Genito-Urinary ROS: negative for - dysuria, hematuria, incontinence or urinary frequency/urgency Musculoskeletal ROS: negative for - joint swelling or muscular weakness Neurological ROS: as noted in HPI Dermatological ROS: negative for rash and skin lesion changes  Physical Examination: Blood pressure 150/68, pulse 64, temperature 98.3 F (36.8 C), temperature source Oral, resp. rate 16, SpO2 100 %.  HEENT-  Normocephalic, no lesions, without obvious abnormality.  Normal external eye and conjunctiva.  Normal TM's bilaterally.  Normal auditory canals and external ears. Normal external nose, mucus membranes and septum.  Normal pharynx. Neck supple with no masses, nodes, nodules or enlargement. Cardiovascular - regular rate and rhythm, S1, S2 normal, no murmur, click, rub or gallop Lungs - chest clear, no wheezing, rales, normal symmetric air entry Abdomen - soft, non-tender; bowel sounds normal; no masses,  no organomegaly Extremities - no joint deformities, effusion, or inflammation and no edema  Neurologic Examination: Mental Status: Alert, oriented, thought content appropriate.  Speech fluent without evidence of aphasia. Able to follow commands without difficulty. Cranial Nerves: II-dense left homonymous hemianopsia. III/IV/VI-Pupils were equal and reacted normally to light. Extraocular movements were full and conjugate.    V/VII-no facial numbness and no facial weakness. VIII-normal. X-normal speech and symmetrical palatal movement. XI: trapezius strength/neck flexion strength normal bilaterally XII-midline tongue extension with normal strength. Motor: 5/5 bilaterally with normal tone and bulk; tremor right upper extremity noted here to be more  of a resting tremor than action tremor. Sensory: Normal throughout. Deep Tendon Reflexes: 1+ and symmetric. Plantars: Flexor bilaterally Cerebellar: Normal finger-to-nose testing. Carotid auscultation: Normal  Results for orders placed or performed during the hospital encounter of 01/10/16 (from the past 48 hour(s))  Protime-INR     Status: None   Collection Time: 01/10/16  4:44 PM  Result Value Ref Range   Prothrombin Time 14.2 11.6 - 15.2 seconds   INR 1.08 0.00 - 1.49  APTT     Status: None   Collection Time: 01/10/16  4:44 PM  Result Value Ref Range   aPTT 30 24 - 37 seconds  CBC     Status: None   Collection Time: 01/10/16  4:44 PM  Result Value Ref Range   WBC 6.6 4.0 - 10.5 K/uL   RBC 4.81 3.87 - 5.11 MIL/uL   Hemoglobin 12.7 12.0 - 15.0 g/dL   HCT 39.3 36.0 - 46.0 %   MCV 81.7 78.0 - 100.0 fL   MCH 26.4 26.0 - 34.0 pg   MCHC 32.3 30.0 - 36.0 g/dL   RDW 14.6 11.5 - 15.5 %   Platelets 292 150 - 400 K/uL  Differential     Status: None   Collection Time: 01/10/16  4:44 PM  Result Value Ref Range   Neutrophils Relative % 46 %   Neutro Abs 3.0 1.7 - 7.7 K/uL   Lymphocytes Relative 50 %   Lymphs Abs 3.3 0.7 - 4.0  K/uL   Monocytes Relative 4 %   Monocytes Absolute 0.2 0.1 - 1.0 K/uL   Eosinophils Relative 1 %   Eosinophils Absolute 0.0 0.0 - 0.7 K/uL   Basophils Relative 1 %   Basophils Absolute 0.0 0.0 - 0.1 K/uL  Comprehensive metabolic panel     Status: Abnormal   Collection Time: 01/10/16  4:44 PM  Result Value Ref Range   Sodium 138 135 - 145 mmol/L   Potassium 3.8 3.5 - 5.1 mmol/L   Chloride 105 101 - 111 mmol/L   CO2 25 22 - 32 mmol/L   Glucose, Bld 133 (H) 65 - 99 mg/dL   BUN 10 6 - 20 mg/dL   Creatinine, Ser 1.00 0.44 - 1.00 mg/dL   Calcium 9.6 8.9 - 10.3 mg/dL   Total Protein 7.4 6.5 - 8.1 g/dL   Albumin 3.8 3.5 - 5.0 g/dL   AST 22 15 - 41 U/L   ALT 14 14 - 54 U/L   Alkaline Phosphatase 59 38 - 126 U/L   Total Bilirubin 0.6 0.3 - 1.2 mg/dL   GFR  calc non Af Amer 52 (L) >60 mL/min   GFR calc Af Amer >60 >60 mL/min    Comment: (NOTE) The eGFR has been calculated using the CKD EPI equation. This calculation has not been validated in all clinical situations. eGFR's persistently <60 mL/min signify possible Chronic Kidney Disease.    Anion gap 8 5 - 15  I-stat troponin, ED     Status: None   Collection Time: 01/10/16  5:02 PM  Result Value Ref Range   Troponin i, poc 0.00 0.00 - 0.08 ng/mL   Comment 3            Comment: Due to the release kinetics of cTnI, a negative result within the first hours of the onset of symptoms does not rule out myocardial infarction with certainty. If myocardial infarction is still suspected, repeat the test at appropriate intervals.    Ct Head Wo Contrast  01/10/2016  CLINICAL DATA:  Right-sided headaches and ataxia for 3 weeks. Concern for stroke. EXAM: CT HEAD WITHOUT CONTRAST TECHNIQUE: Contiguous axial images were obtained from the base of the skull through the vertex without intravenous contrast. COMPARISON:  None. FINDINGS: There is cytotoxic edema in the medial right occipital lobe and posterior right temporal lobe consistent with a moderate-sized, likely subacute PCA infarct. There is no evidence of intracranial hemorrhage, midline shift, mass, or extra-axial fluid collection. There is mild global cerebral atrophy. Periventricular white-matter hypodensities are nonspecific but compatible with mild chronic small vessel ischemic disease. No acute abnormality identified in the imaged orbits. The visualized paranasal sinuses and mastoid air cells are clear. Calcified atherosclerosis is noted at the skullbase. No acute osseous abnormality. IMPRESSION: 1. Moderate-sized subacute right PCA infarct. No evidence of hemorrhage. 2. Mild chronic small vessel ischemic disease and cerebral atrophy. These results will be called to the ordering clinician or representative by the Radiologist Assistant, and communication  documented in the PACS or zVision Dashboard. Electronically Signed   By: Logan Bores M.D.   On: 01/10/2016 15:44    Assessment: 82 y.o. female with multiple risk factors for stroke presenting with subacute right PCA territory ischemic infarction  Stroke Risk Factors - diabetes mellitus, hyperlipidemia and hypertension  Plan: 1. HgbA1c, fasting lipid panel 2. CT angiogram of head and neck 3. PT consult, OT consult, Speech consult 4. Echocardiogram 5. Prophylactic therapy-Antiplatelet med: Aspirin  6. Risk factor modification 7.  Telemetry monitoring  C.R. Nicole Kindred, MD [facility] [phone]  01/10/2016, 10:11 PM

## 2016-01-10 NOTE — ED Notes (Signed)
Neuro at bedside.

## 2016-01-10 NOTE — ED Notes (Signed)
Pt unable to get MRI due to pacemaker

## 2016-01-10 NOTE — Telephone Encounter (Signed)
This is not a TIA She has had a CVA

## 2016-01-11 ENCOUNTER — Encounter (HOSPITAL_COMMUNITY): Payer: Self-pay | Admitting: Radiology

## 2016-01-11 ENCOUNTER — Inpatient Hospital Stay (HOSPITAL_COMMUNITY): Payer: Medicare Other

## 2016-01-11 DIAGNOSIS — E1140 Type 2 diabetes mellitus with diabetic neuropathy, unspecified: Secondary | ICD-10-CM

## 2016-01-11 DIAGNOSIS — Z794 Long term (current) use of insulin: Secondary | ICD-10-CM

## 2016-01-11 DIAGNOSIS — I6522 Occlusion and stenosis of left carotid artery: Secondary | ICD-10-CM

## 2016-01-11 LAB — GLUCOSE, CAPILLARY
GLUCOSE-CAPILLARY: 119 mg/dL — AB (ref 65–99)
GLUCOSE-CAPILLARY: 217 mg/dL — AB (ref 65–99)
Glucose-Capillary: 68 mg/dL (ref 65–99)
Glucose-Capillary: 120 mg/dL — ABNORMAL HIGH (ref 65–99)

## 2016-01-11 LAB — BASIC METABOLIC PANEL
Anion gap: 9 (ref 5–15)
BUN: 9 mg/dL (ref 6–20)
CO2: 24 mmol/L (ref 22–32)
Calcium: 9.4 mg/dL (ref 8.9–10.3)
Chloride: 106 mmol/L (ref 101–111)
Creatinine, Ser: 0.92 mg/dL (ref 0.44–1.00)
GFR, EST NON AFRICAN AMERICAN: 58 mL/min — AB (ref >60)
GLUCOSE: 104 mg/dL — AB (ref 65–99)
POTASSIUM: 3.1 mmol/L — AB (ref 3.5–5.1)
SODIUM: 139 mmol/L (ref 135–145)

## 2016-01-11 LAB — LIPID PANEL
CHOLESTEROL: 218 mg/dL — AB (ref 0–200)
HDL: 30 mg/dL — ABNORMAL LOW (ref >40)
LDL Cholesterol: 152 mg/dL — ABNORMAL HIGH (ref 0–99)
Total CHOL/HDL Ratio: 7.3 RATIO
Triglycerides: 178 mg/dL — ABNORMAL HIGH (ref <150)
VLDL: 36 mg/dL (ref 0–40)

## 2016-01-11 LAB — PHOSPHORUS: PHOSPHORUS: 3.4 mg/dL (ref 2.5–4.6)

## 2016-01-11 LAB — MAGNESIUM: Magnesium: 1.5 mg/dL — ABNORMAL LOW (ref 1.7–2.4)

## 2016-01-11 MED ORDER — LISINOPRIL 5 MG PO TABS
5.0000 mg | ORAL_TABLET | Freq: Every day | ORAL | Status: DC
  Administered 2016-01-11 – 2016-01-12 (×2): 5 mg via ORAL
  Filled 2016-01-11 (×2): qty 1

## 2016-01-11 MED ORDER — LEVOTHYROXINE SODIUM 25 MCG PO TABS
137.0000 ug | ORAL_TABLET | Freq: Every day | ORAL | Status: DC
  Administered 2016-01-11 – 2016-01-12 (×2): 137 ug via ORAL
  Filled 2016-01-11 (×2): qty 1

## 2016-01-11 MED ORDER — POTASSIUM CHLORIDE CRYS ER 20 MEQ PO TBCR
40.0000 meq | EXTENDED_RELEASE_TABLET | ORAL | Status: AC
  Administered 2016-01-11 (×2): 40 meq via ORAL
  Filled 2016-01-11 (×2): qty 2

## 2016-01-11 MED ORDER — CARVEDILOL 12.5 MG PO TABS
12.5000 mg | ORAL_TABLET | Freq: Two times a day (BID) | ORAL | Status: DC
  Administered 2016-01-11 – 2016-01-12 (×2): 12.5 mg via ORAL
  Filled 2016-01-11 (×3): qty 1

## 2016-01-11 MED ORDER — PANTOPRAZOLE SODIUM 40 MG PO TBEC
40.0000 mg | DELAYED_RELEASE_TABLET | Freq: Every day | ORAL | Status: DC
  Administered 2016-01-11 – 2016-01-12 (×2): 40 mg via ORAL
  Filled 2016-01-11: qty 1

## 2016-01-11 MED ORDER — ACETAMINOPHEN 325 MG PO TABS
650.0000 mg | ORAL_TABLET | Freq: Four times a day (QID) | ORAL | Status: DC | PRN
  Administered 2016-01-11 (×2): 650 mg via ORAL
  Filled 2016-01-11 (×2): qty 2

## 2016-01-11 MED ORDER — ASPIRIN 325 MG PO TABS
325.0000 mg | ORAL_TABLET | Freq: Every day | ORAL | Status: DC
  Administered 2016-01-11 – 2016-01-12 (×2): 325 mg via ORAL
  Filled 2016-01-11: qty 1

## 2016-01-11 MED ORDER — IOPAMIDOL (ISOVUE-370) INJECTION 76%
INTRAVENOUS | Status: AC
  Administered 2016-01-11: 50 mL
  Filled 2016-01-11: qty 50

## 2016-01-11 MED ORDER — ATORVASTATIN CALCIUM 10 MG PO TABS: 20.0000 mg | ORAL_TABLET | Freq: Every day | ORAL | Status: DC

## 2016-01-11 MED ORDER — CLOPIDOGREL BISULFATE 75 MG PO TABS
75.0000 mg | ORAL_TABLET | Freq: Every day | ORAL | Status: DC
  Administered 2016-01-11 – 2016-01-12 (×2): 75 mg via ORAL
  Filled 2016-01-11 (×2): qty 1

## 2016-01-11 MED ORDER — ROSUVASTATIN CALCIUM 10 MG PO TABS
10.0000 mg | ORAL_TABLET | Freq: Every day | ORAL | Status: DC
  Administered 2016-01-11: 10 mg via ORAL
  Filled 2016-01-11 (×2): qty 1

## 2016-01-11 NOTE — Evaluation (Signed)
Occupational Therapy Evaluation Patient Details Name: Keiana Tavella MRN: [id number] DOB: March 29, 1934 Today's Date: 01/11/2016    History of Present Illness Pt is a 82 y/o female who presents s/p unresponsive event at home. Upon workup in ED, CT revealed subacute R ischemic stroke. No MRI pending as pt has pacemaker.    Clinical Impression   Patient presenting with decreased ADL and functional mobility independence secondary to above. Patient independent to mod I PTA, living with son who works during the day. Patient currently functioning at an overall min to mod assist level. Patient will benefit from acute OT to increase overall independence in the areas of ADLs, functional mobility, and overall safety in order to safely discharge home with strict 24/7 supervision/assistance vs post acute rehab.     Follow Up Recommendations  Home health OT;Supervision/Assistance - 24 hour (Pt may benefit from Post acute rehab services, especially if she does not have strict 24/7 supervision/assistance)    Equipment Recommendations  3 in 1 bedside comode    Recommendations for Other Services  None at this time    Precautions / Restrictions Precautions Precautions: Fall Restrictions Weight Bearing Restrictions: No    Mobility Bed Mobility Overal bed mobility: Needs Assistance Bed Mobility: Supine to Sit;Sit to Supine     Supine to sit: Min assist Sit to supine: Min assist   General bed mobility comments: Min assist due to increased weakness and headache. Increased time and use of bed rails used.   Transfers Overall transfer level: Needs assistance Equipment used: Rolling walker (2 wheeled) Transfers: Sit to/from [employer] Sit to Stand: Mod assist Stand pivot transfers: Mod assist       General transfer comment: During initial sit to/from stand and stand pivot transfer, pt required mod assist with use of RW. Pt impulsive during initial stand and transfer due to need to  use bathroom.     Balance Overall balance assessment: Needs assistance Sitting-balance support: No upper extremity supported;Feet supported Sitting balance-Leahy Scale: Fair     Standing balance support: Single extremity supported;During functional activity Standing balance-Leahy Scale: Poor Standing balance comment: Reliance on RW for support and balance     ADL Overall ADL's : Needs assistance/impaired Eating/Feeding: Set up;Bed level   Grooming: Standing;Min guard   Upper Body Bathing: Minimal assitance;Sitting   Lower Body Bathing: Minimal assistance;Sit to/from stand   Upper Body Dressing : Minimal assistance;Sitting   Lower Body Dressing: Minimal assistance;Sit to/from stand   Toilet Transfer: Moderate assistance;BSC;RW   Toileting- Clothing Manipulation and Hygiene: Moderate assistance;Sit to/from Nurse, children's Details (indicate cue type and reason): did not occur   General ADL Comments: Upon entering room, pt found supine in bed with complaints of a headache. Therapist notified RN and RN present during session for pain medication. Pt engaged in bed mobility and able to reach down to BLEs with increased time and encouragement for LB dressing. Pt then with urgency to use restroom and became impulsive with transfer. Therapist had pt use BSC due to impulsivity. Pt then ambulated to sink for washing of hands with mod assist due to decreased RW safety awareness. Pt with decreased vision noted during functional tasks, see vision assessment.      Vision Vision Assessment?: Yes;Vision impaired- to be further tested in functional context Eye Alignment: Impaired (comment) Ocular Range of Motion: Impaired-to be further tested in functional context Alignment/Gaze Preference: Gaze right Tracking/Visual Pursuits: Impaired - to be further tested in functional context Additional Comments:  Pt with decreased peripheral field vision to the left/left eye. Unable to fully  test due to patient's pain and attention during this OT eval. Unsure if patient has some visual field loss? wrote goal to continue to assess this.           Pertinent Vitals/Pain Pain Assessment: Faces Faces Pain Scale: Hurts even more Pain Location: head Pain Descriptors / Indicators: Headache Pain Intervention(s): Limited activity within patient's tolerance;Monitored during session;Patient requesting pain meds-RN notified;RN gave pain meds during session     Hand Dominance Right   Extremity/Trunk Assessment Upper Extremity Assessment Upper Extremity Assessment: LUE deficits/detail;RUE deficits/detail RUE Deficits / Details: R-sided tremor noted however pt states this is baseline LUE Deficits / Details: Initially during ROM and strength testing, pt unable to lift LUE. Therapist asked towards end of session and patient's overall strength=3+/5. Pt presents with decreased cognition during OT eval.  LUE Coordination: decreased gross motor   Lower Extremity Assessment Lower Extremity Assessment: Defer to PT evaluation LLE Coordination: decreased gross motor   Cervical / Trunk Assessment Cervical / Trunk Assessment: Kyphotic   Communication Communication Communication: Expressive difficulties   Cognition Arousal/Alertness: Awake/alert Behavior During Therapy: WFL for tasks assessed/performed Overall Cognitive Status: Impaired/Different from baseline Area of Impairment: Attention;Following commands;Safety/judgement;Awareness;Problem solving   Current Attention Level: Sustained Memory: Decreased recall of precautions;Decreased short-term memory Following Commands: Follows one step commands inconsistently Safety/Judgement: Decreased awareness of safety;Decreased awareness of deficits Awareness: Intellectual Problem Solving: Slow processing;Decreased initiation;Difficulty sequencing;Requires verbal cues;Requires tactile cues General Comments: Pt made comment once getting ready to get  back to bed, "is this my room?". After RN administerred pain medication, pt commented "did I get my tylenol?" Friend present and states patient was very "sharp" PTA.               Home Living Family/patient expects to be discharged to:: Private residence Living Arrangements: Children Available Help at Discharge: Family;Available 24 hours/day (at least the first few days) Type of Home: House Home Access: Stairs to enter [employer] of Steps: 1 Entrance Stairs-Rails: None Home Layout: Two level;Bed/bath upstairs Alternate Level Stairs-Number of Steps: 7-10 steps x2  Alternate Level Stairs-Rails: Right Bathroom Shower/Tub: Chief Strategy Officer: Standard Bathroom Accessibility: Yes   Home Equipment: Walker - 2 wheels;Cane - single point;Shower seat   Prior Functioning/Environment Level of Independence: Independent        Comments: Pt states she occasionally used her cane but has not used it in a long time. Per son, pt has been having balance difficulties for ~2 weeks PTA.     OT Diagnosis: Generalized weakness;Cognitive deficits;Disturbance of vision;Acute pain;Hemiplegia non-dominant side   OT Problem List: Decreased strength;Decreased range of motion;Decreased activity tolerance;Impaired balance (sitting and/or standing);Impaired vision/perception;Decreased coordination;Decreased cognition;Decreased safety awareness;Decreased knowledge of use of DME or AE;Decreased knowledge of precautions;Impaired tone;Impaired UE functional use;Pain   OT Treatment/Interventions: Self-care/ADL training;Therapeutic exercise;Energy conservation;Neuromuscular education;DME and/or AE instruction;Therapeutic activities;Patient/family education;Balance training    OT Goals(Current goals can be found in the care plan section) Acute Rehab OT Goals Patient Stated Goal: Home at d/c OT Goal Formulation: With patient Time For Goal Achievement: 01/25/16 Potential to Achieve Goals:  Good ADL Goals Pt Will Perform Grooming: with supervision;standing Pt Will Perform Lower Body Bathing: with supervision;sit to/from stand Pt Will Perform Lower Body Dressing: with supervision;sit to/from stand Pt Will Transfer to Toilet: with supervision;bedside commode;ambulating Pt Will Perform Tub/Shower Transfer: with supervision;Tub transfer;ambulating;rolling walker Pt/caregiver will Perform Home Exercise Program: Increased strength;Left upper extremity;With Supervision;With  written HEP provided Additional ADL Goal #1: Pt's vision will be further tested to determine if pt has any field loss or low vision in order to be able to correctly form any interventions for patient's safety and independence   OT Frequency: Min 2X/week   Barriers to D/C: Decreased caregiver support    End of Session Equipment Utilized During Treatment: Gait belt;Rolling walker Nurse Communication: Patient requests pain meds  Activity Tolerance: Patient tolerated treatment well Patient left: in bed;with call bell/phone within reach;with bed alarm set;with family/visitor present   Time: 0272-5366 OT Time Calculation (min): 21 min Charges:  OT General Charges $OT Visit: 1 Procedure OT Evaluation $OT Eval Moderate Complexity: 1 Procedure  Edwin Cap , MS, OTR/L, CLT  01/11/2016, 3:30 PM

## 2016-01-11 NOTE — Evaluation (Signed)
Physical Therapy Evaluation Patient Details Name: [name] [name] MRN: [id number] DOB: February 06, 1934 Today's Date: 01/11/2016   History of Present Illness  Pt is a 82 y/o female who presents s/p unresponsive event at home. Upon workup in ED, CT revealed subacute R ischemic stroke. No MRI pending as pt has pacemaker.   Clinical Impression  Pt admitted with above diagnosis. Pt currently with functional limitations due to the deficits listed below (see PT Problem List). At the time of PT eval pt was able to perform transfers and ambulation with min guard assist for safety. Pt demonstrated some L sided coordination deficits, however strength appeared fairly equal between R and L. Pt will benefit from skilled PT to increase their independence and safety with mobility to allow discharge to the venue listed below.       Follow Up Recommendations Home health PT;Supervision/Assistance - 24 hour    Equipment Recommendations  None recommended by PT    Recommendations for Other Services       Precautions / Restrictions Precautions Precautions: Fall Restrictions Weight Bearing Restrictions: No      Mobility  Bed Mobility Overal bed mobility: Needs Assistance Bed Mobility: Supine to Sit     Supine to sit: Supervision     General bed mobility comments: Supervision for safety. Pt was able to transition to EOB with increased time and min use of bedrails for assist.   Transfers Overall transfer level: Needs assistance Equipment used: Rolling walker (2 wheeled) Transfers: Sit to/from Stand Sit to Stand: Min guard         General transfer comment: Hands-on guarding for assistance. Pt stood to RW and placed R hand on walker with ease. Pt required cueing to place L hand on walker and had coordination difficulties to place it on the middle of the walker handle.   Ambulation/Gait Ambulation/Gait assistance: Min assist Ambulation Distance (Feet): 120 Feet Assistive device: Rolling walker (2  wheeled) Gait Pattern/deviations: Step-through pattern;Decreased stride length;Drifts right/left Gait velocity: Decreased Gait velocity interpretation: Below normal speed for age/gender General Gait Details: VC's for improved posture and management of RW. Pt required occasional min assist for walker direction. She continually attempted to turn right into every room that we passed in the hall despite cues to keep walking straight.   Stairs            Wheelchair Mobility    Modified Rankin (Stroke Patients Only) Modified Rankin (Stroke Patients Only) Pre-Morbid Rankin Score: No symptoms Modified Rankin: Moderately severe disability     Balance Overall balance assessment: Needs assistance Sitting-balance support: Feet supported;No upper extremity supported Sitting balance-Leahy Scale: Fair     Standing balance support: Single extremity supported;During functional activity Standing balance-Leahy Scale: Poor Standing balance comment: Requires at least 1 UE support                             Pertinent Vitals/Pain Pain Assessment: No/denies pain    Home Living Family/patient expects to be discharged to:: Private residence Living Arrangements: Children Available Help at Discharge: Family;Available 24 hours/day (at least the first few days) Type of Home: House Home Access: Stairs to enter Entrance Stairs-Rails: None Entrance Stairs-Number of Steps: 1 Home Layout: Two level;Bed/bath upstairs Home Equipment: Walker - 2 wheels;Cane - single point;Shower seat      Prior Function Level of Independence: Independent         Comments: Pt states she occasionally used her cane but has not used  it in a long time. Per son, pt has been having balance difficulties for ~2 weeks PTA.      Hand Dominance   Dominant Hand: Right    Extremity/Trunk Assessment   Upper Extremity Assessment: LUE deficits/detail;RUE deficits/detail RUE Deficits / Details: R-sided tremor  noted however pt states this is baseline     LUE Deficits / Details: See OT eval for full UE evaluation   Lower Extremity Assessment: LLE deficits/detail      Cervical / Trunk Assessment: Kyphotic  Communication   Communication: No difficulties  Cognition Arousal/Alertness: Awake/alert Behavior During Therapy: WFL for tasks assessed/performed Overall Cognitive Status: Within Functional Limits for tasks assessed                      General Comments      Exercises        Assessment/Plan    PT Assessment Patient needs continued PT services  PT Diagnosis Difficulty walking   PT Problem List Decreased strength;Decreased range of motion;Decreased activity tolerance;Decreased balance;Decreased mobility;Decreased knowledge of use of DME;Decreased safety awareness;Decreased knowledge of precautions;Decreased coordination  PT Treatment Interventions DME instruction;Gait training;Stair training;Functional mobility training;Therapeutic activities;Therapeutic exercise;Neuromuscular re-education;Patient/family education   PT Goals (Current goals can be found in the Care Plan section) Acute Rehab PT Goals Patient Stated Goal: Home at d/c PT Goal Formulation: With patient/family Time For Goal Achievement: 01/25/16 Potential to Achieve Goals: Good    Frequency Min 4X/week   Barriers to discharge        Co-evaluation               End of Session Equipment Utilized During Treatment: Gait belt Activity Tolerance: Patient tolerated treatment well Patient left: in chair;with call bell/phone within reach;with chair alarm set;with family/visitor present Nurse Communication: Mobility status         Time: 5053-9767 PT Time Calculation (min) (ACUTE ONLY): 23 min   Charges:   PT Evaluation $PT Eval Moderate Complexity: 1 Procedure PT Treatments $Gait Training: 8-22 mins   PT G Codes:        Conni Slipper 02-05-16, 2:20 PM   Conni Slipper, PT, DPT Acute  Rehabilitation Services Pager: [phone]

## 2016-01-11 NOTE — Progress Notes (Signed)
[facility] PROGRESS NOTE  [name] [name] MCR:[id number] DOB: December 08, 1933 DOA: 01/10/2016 PCP: Sanda Linger, MD  Interim summary and HPI 82 y.o. female with medical history significant of hypothyroidism, hyperlipidemia, osteoarthritis, type 2 diabetes, CAD, left bundle branch block, chronic combined systolic and diastolic CHF, GERD who came to the emergency department due to recurrent dizziness episodes and unsteady gait for almost 2 weeks. She denies numbness or weakness, slurred speech or language articulation/comprehension difficulty. She describes this sensation as feeling drunk.  Positive MRI for stroke in posterior cerebral artery. Work up in progress. Neurology on board. 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 Continue lisinopril 5 mg daily Monitor blood pressure periodically and be permissive with hypertension in acute/subacute ischemic setting .   Chronic systolic heart failure (HCC) Compensated. Continue carvedilol, lisinopril. At home was using PRN lasix    Hypothyroidism Will continue synthroid    Type 2 diabetes mellitus with diabetic neuropathy, with long-term current use of insulin (HCC) Continue carbohydrate modified diet. Continue Lantus and SSI Will follow A1C   Code Status: Full Family Communication: no family at bedside Disposition Plan: to be determine; will complete stroke work up.   Consultants:  Neurology   Procedures:  See below for x-ray reports  2-D echo: pending  CT angio head/neck: pending   Antibiotics:  None    HPI/Subjective: Feeling dizzy and with right side HA's. Denies CP and SOB. Reporting some nausea.  Objective: Filed Vitals:   01/11/16 1050 01/11/16 1425  BP: 175/73 117/56  Pulse: 66 72  Temp: 98.9 F (37.2 C) 98.2 F (36.8 C)  Resp: 18 18   No intake or output data in the 24 hours ending 01/11/16 1647 Filed Weights   01/11/16 0900  Weight: 63.413 kg (139 lb 12.8 oz)    Exam:   General: still feeling dizzy and weak. Also with some nausea and right side HA. No vision changes, CP or SOB. Patient afebrile.  Cardiovascular: S1 and S2, no rubs or gallops  Respiratory: CTA bilaterally and with good O2 sat on RA  Abdomen: soft, NT, ND, positive BS  Musculoskeletal: no edema, no cyanosis   Neuro: good MS bilaterally, grossly intact CN and EOMI. Patient still feeling dizzy and with HA's.  Data Reviewed: Basic Metabolic Panel:  Recent Labs Lab 01/10/16 1416 01/10/16 1644 01/10/16 2300 01/11/16 0503  NA 138 138  --  139  K 3.9 3.8  --  3.1*  CL 103 105  --  106  CO2 26 25  --  24  GLUCOSE 167* 133*  --  104*  BUN 11 10  --  9  CREATININE 1.07 1.00 0.94 0.92  CALCIUM 9.8 9.6  --  9.4  MG  --   --   --  1.5*  PHOS  --   --   --  3.4   Liver Function Tests:  Recent Labs Lab 01/10/16 1644  AST 22  ALT 14  ALKPHOS 59  BILITOT 0.6  PROT 7.4  ALBUMIN 3.8   CBC:  Recent Labs Lab 01/10/16 1644 01/10/16 2300  WBC 6.6 6.7  NEUTROABS 3.0  --  HGB 12.7 11.9*  HCT 39.3 36.7  MCV 81.7 81.7  PLT 292 256   CBG:  Recent Labs Lab 01/10/16 2306 01/11/16 0739 01/11/16 1132  GLUCAP 102* 119* 217*   Studies: Ct Angio Head W/cm &/or Wo Cm  01/11/2016  CLINICAL DATA:  82 year old female with ataxia and right side headache for multiple days. Right PCA infarct on noncontrast head CT yesterday. Initial encounter. EXAM: CT ANGIOGRAPHY HEAD AND NECK TECHNIQUE: Multidetector CT imaging of the head and neck was performed using the standard protocol during bolus  administration of intravenous contrast. Multiplanar CT image reconstructions and MIPs were obtained to evaluate the vascular anatomy. Carotid stenosis measurements (when applicable) are obtained utilizing NASCET criteria, using the distal internal carotid diameter as the denominator. CONTRAST:  50 mL Isovue 370 COMPARISON:  Noncontrast head CT 01/10/2016. FINDINGS: CTA NECK Skeleton: Multilevel degenerative changes in the cervical spine. Partially visible thoracic levoconvex scoliosis. Absent dentition. No acute osseous abnormality identified. Minimal posterior right ethmoid sinus mucosal thickening. Other Visualized paranasal sinuses and mastoids are clear. Other neck: Left chest cardiac pacemaker type device. Negative lung apices. No superior mediastinal lymphadenopathy. Small volume of gas distending the upper thoracic esophagus, nonspecific. Negative thyroid, larynx (glottis is closed), pharynx, parapharyngeal spaces, retropharyngeal space, sublingual space, submandibular glands and parotid glands. No cervical lymphadenopathy. Aortic arch: 3 vessel arch configuration. Mild mostly soft arch atherosclerotic plaque. Right carotid system: Tortuous proximal right CCA. Calcified plaque at the posterior right ICA origin and bulb resulting in less than 50 % stenosis with respect to the distal vessel. Mildly tortuous cervical right ICA. Left carotid system: Tortuous left CCA. Bulky calcified plaque at the left carotid bifurcation and left ICA origin resulting in radiographic string sign stenosis over a segment of 8-10 mm (series 403, image 117). The left ICA remains patent, and is mildly tortuous distal to the bulb. Vertebral arteries:No proximal right subclavian artery stenosis despite calcified plaque. Normal right vertebral artery origin. Tortuous right V2 segment. Otherwise negative right vertebral artery to the skullbase. 50% stenosis of the proximal left subclavian artery related the calcified plaque. Dense  calcified plaque at the left vertebral artery origin resulting in severe stenosis (series 403, image 136). The left vertebral artery rib remains patent. The vertebral arteries are fairly codominant. The left V2 segment is tortuous. Otherwise negative left vertebral artery to the skullbase. CTA HEAD Posterior circulation: No distal right vertebral artery stenosis. Calcified plaque in the proximal left V4 segment which is not significant. Normal left PICA and right AICA origins. Normal vertebrobasilar junction. No basilar stenosis. Normal SCA and PCA origins. However, there is high-grade stenosis in the right PCA P1 segment (series 405, image 22), and the right PCA occludes in the P2 segment best seen on series 403 images 93 through 95. Furthermore there are tandem moderate to severe stenoses in the left PCA P1 and P2 segments (series 406, image 31). Anterior circulation: Both ICA siphons are patent, but there is severe bilateral calcified atherosclerosis resulting an hemodynamically significant stenosis at the distal cavernous and proximal supraclinoid segments on the left as well as the proximal supraclinoid segment on the right. Right ophthalmic artery origin remains normal. Carotid termini remain patent. There is a mild stenosis at the left ACA origin. A1 segments, anterior communicating artery, and bilateral ACA branches then are within normal limits. Left MCA origin is normal. Left M1 segment, left MCA bifurcation, and left MCA branches are within normal limits. Right MCA origin, right M1 segment, bifurcation, and right MCA  branches are within normal limits. Venous sinuses: Patent. Anatomic variants: None. Delayed phase: Cytotoxic edema in the right PCA territory involving the occipital lobe, the mesial temporal lobe, an the right hippocampal formation appears stable. No associated hemorrhage. No significant mass effect. Stable gray-white matter differentiation elsewhere. No abnormal enhancement identified.  IMPRESSION: 1. Positive for emergent large vessel occlusion of the right PCA in the P2 segment. High-grade stenosis in the right P1 segment. 2. Other hemodynamically significant intracranial stenoses: - Bilateral ICA siphons, related to calcified plaque. - Left PCA P1 and P2 segments. 3. High-grade RADIOGRAPHIC STRING SIGN stenosis of the left ICA origin. 4. Severe stenosis of the left vertebral artery origin due to calcified plaque. 5. Right carotid bifurcation atherosclerosis without significant stenosis. 50% stenosis proximal left subclavian artery. 6. Stable CT appearance of the confluent right PCA territory infarcts since yesterday. No associated hemorrhage or significant mass effect. No new intracranial abnormality identified. Study discussed by telephone with Stroke Neurology Provider Annie Main NP on 01/11/2016 at 11:03 . Electronically Signed   By: Odessa Fleming M.D.   On: 01/11/2016 11:04   Dg Chest 2 View  01/10/2016  CLINICAL DATA:  Dizziness and lethargy EXAM: CHEST  2 VIEW COMPARISON:  Jan 01, 2016 FINDINGS: The pacemaker is stable. Mild vascular crowding in the medial right lung base. The heart, hila, mediastinum, lungs, and pleura are otherwise stable. Anterior wedging of 2 lower thoracic vertebral bodies. Only 1 of these vertebral bodies was involved in February of 2015. IMPRESSION: Anterior wedging of a lower thoracic vertebral body, new since February 2015 but otherwise age indeterminate. Recommend clinical correlation. No other acute abnormalities. Electronically Signed   By: Gerome Sam III M.D   On: 01/10/2016 22:15   Ct Head Wo Contrast  01/10/2016  CLINICAL DATA:  Right-sided headaches and ataxia for 3 weeks. Concern for stroke. EXAM: CT HEAD WITHOUT CONTRAST TECHNIQUE: Contiguous axial images were obtained from the base of the skull through the vertex without intravenous contrast. COMPARISON:  None. FINDINGS: There is cytotoxic edema in the medial right occipital lobe and posterior right  temporal lobe consistent with a moderate-sized, likely subacute PCA infarct. There is no evidence of intracranial hemorrhage, midline shift, mass, or extra-axial fluid collection. There is mild global cerebral atrophy. Periventricular white-matter hypodensities are nonspecific but compatible with mild chronic small vessel ischemic disease. No acute abnormality identified in the imaged orbits. The visualized paranasal sinuses and mastoid air cells are clear. Calcified atherosclerosis is noted at the skullbase. No acute osseous abnormality. IMPRESSION: 1. Moderate-sized subacute right PCA infarct. No evidence of hemorrhage. 2. Mild chronic small vessel ischemic disease and cerebral atrophy. These results will be called to the ordering clinician or representative by the Radiologist Assistant, and communication documented in the PACS or zVision Dashboard. Electronically Signed   By: Sebastian Ache M.D.   On: 01/10/2016 15:44   Ct Angio Neck W/cm &/or Wo/cm  01/11/2016  CLINICAL DATA:  82 year old female with ataxia and right side headache for multiple days. Right PCA infarct on noncontrast head CT yesterday. Initial encounter. EXAM: CT ANGIOGRAPHY HEAD AND NECK TECHNIQUE: Multidetector CT imaging of the head and neck was performed using the standard protocol during bolus administration of intravenous contrast. Multiplanar CT image reconstructions and MIPs were obtained to evaluate the vascular anatomy. Carotid stenosis measurements (when applicable) are obtained utilizing NASCET criteria, using the distal internal carotid diameter as the denominator. CONTRAST:  50 mL Isovue 370 COMPARISON:  Noncontrast head CT 01/10/2016. FINDINGS: CTA  NECK Skeleton: Multilevel degenerative changes in the cervical spine. Partially visible thoracic levoconvex scoliosis. Absent dentition. No acute osseous abnormality identified. Minimal posterior right ethmoid sinus mucosal thickening. Other Visualized paranasal sinuses and mastoids are  clear. Other neck: Left chest cardiac pacemaker type device. Negative lung apices. No superior mediastinal lymphadenopathy. Small volume of gas distending the upper thoracic esophagus, nonspecific. Negative thyroid, larynx (glottis is closed), pharynx, parapharyngeal spaces, retropharyngeal space, sublingual space, submandibular glands and parotid glands. No cervical lymphadenopathy. Aortic arch: 3 vessel arch configuration. Mild mostly soft arch atherosclerotic plaque. Right carotid system: Tortuous proximal right CCA. Calcified plaque at the posterior right ICA origin and bulb resulting in less than 50 % stenosis with respect to the distal vessel. Mildly tortuous cervical right ICA. Left carotid system: Tortuous left CCA. Bulky calcified plaque at the left carotid bifurcation and left ICA origin resulting in radiographic string sign stenosis over a segment of 8-10 mm (series 403, image 117). The left ICA remains patent, and is mildly tortuous distal to the bulb. Vertebral arteries:No proximal right subclavian artery stenosis despite calcified plaque. Normal right vertebral artery origin. Tortuous right V2 segment. Otherwise negative right vertebral artery to the skullbase. 50% stenosis of the proximal left subclavian artery related the calcified plaque. Dense calcified plaque at the left vertebral artery origin resulting in severe stenosis (series 403, image 136). The left vertebral artery rib remains patent. The vertebral arteries are fairly codominant. The left V2 segment is tortuous. Otherwise negative left vertebral artery to the skullbase. CTA HEAD Posterior circulation: No distal right vertebral artery stenosis. Calcified plaque in the proximal left V4 segment which is not significant. Normal left PICA and right AICA origins. Normal vertebrobasilar junction. No basilar stenosis. Normal SCA and PCA origins. However, there is high-grade stenosis in the right PCA P1 segment (series 405, image 22), and the right  PCA occludes in the P2 segment best seen on series 403 images 93 through 95. Furthermore there are tandem moderate to severe stenoses in the left PCA P1 and P2 segments (series 406, image 31). Anterior circulation: Both ICA siphons are patent, but there is severe bilateral calcified atherosclerosis resulting an hemodynamically significant stenosis at the distal cavernous and proximal supraclinoid segments on the left as well as the proximal supraclinoid segment on the right. Right ophthalmic artery origin remains normal. Carotid termini remain patent. There is a mild stenosis at the left ACA origin. A1 segments, anterior communicating artery, and bilateral ACA branches then are within normal limits. Left MCA origin is normal. Left M1 segment, left MCA bifurcation, and left MCA branches are within normal limits. Right MCA origin, right M1 segment, bifurcation, and right MCA branches are within normal limits. Venous sinuses: Patent. Anatomic variants: None. Delayed phase: Cytotoxic edema in the right PCA territory involving the occipital lobe, the mesial temporal lobe, an the right hippocampal formation appears stable. No associated hemorrhage. No significant mass effect. Stable gray-white matter differentiation elsewhere. No abnormal enhancement identified. IMPRESSION: 1. Positive for emergent large vessel occlusion of the right PCA in the P2 segment. High-grade stenosis in the right P1 segment. 2. Other hemodynamically significant intracranial stenoses: - Bilateral ICA siphons, related to calcified plaque. - Left PCA P1 and P2 segments. 3. High-grade RADIOGRAPHIC STRING SIGN stenosis of the left ICA origin. 4. Severe stenosis of the left vertebral artery origin due to calcified plaque. 5. Right carotid bifurcation atherosclerosis without significant stenosis. 50% stenosis proximal left subclavian artery. 6. Stable CT appearance of the confluent right PCA territory  infarcts since yesterday. No associated hemorrhage  or significant mass effect. No new intracranial abnormality identified. Study discussed by telephone with Stroke Neurology Provider Annie Main NP on 01/11/2016 at 11:03 . Electronically Signed   By: Odessa Fleming M.D.   On: 01/11/2016 11:04    Scheduled Meds: . carvedilol  12.5 mg Oral BID WC  . clopidogrel  75 mg Oral Daily  . enoxaparin (LOVENOX) injection  40 mg Subcutaneous Daily  . insulin aspart  0-9 Units Subcutaneous TID WC  . levothyroxine  137 mcg Oral QAC breakfast  . lisinopril  5 mg Oral Daily  . pantoprazole  40 mg Oral Q1200  . rosuvastatin  10 mg Oral q1800  . 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 If 7PM-7AM, please contact night-coverage at [email or web address], password [facility] 01/11/2016, 4:47 PM  LOS: 1 day

## 2016-01-11 NOTE — Consult Note (Signed)
New Carotid Patient  Referred by:  Dr. Vassie Loll ([facility])  Reason for referral: left carotid stenosis  History of Present Illness  [name] [name] is a 82 y.o. ([birth year]) female who presents with chief complaint: drunk walking.  By report family also noticed left sided facial drooping and patient sleeping more than usual.  Her work-up has been consistent with right side posterior CVA.  The patient has never had amaurosis fugax or monocular blindness but now has field defects.  The patient has never had facial drooping or hemiplegia.  The patient has never had receptive or expressive aphasia.   The patient's previous neurologic deficits remain present.  The patient's risks factors for carotid disease include: HLD, DM, HTN, and prior smoking.  Past Medical History  Diagnosis Date  . Hypothyroidism   . High cholesterol   . Osteoarthritis   . Chronic combined systolic and diastolic CHF, NYHA class 3 (HCC)     a. 03/2013 Echo: EF 25-30%, diff HK, sev antsept HK, mild MR.  . Congestive dilated cardiomyopathy (HCC)     a. 03/2013 Echo: EF 25-30%;  b. 09/2013 s/p SJM 3242 CRT-P.  Marland Kitchen Left bundle branch block   . Type II diabetes mellitus (HCC)   . GERD (gastroesophageal reflux disease)   . Coronary artery disease     a. 08/2012 Cath: LM nl, LAD 65m, LCX nondom, mild-mod nonobs dzs mid, OM1 ok, OM2 90/90p, RCA 40, EF 25-30%-->Med Rx.  Marland Kitchen Hypertension     Past Surgical History  Procedure Laterality Date  . Abdominal hysterectomy  1980  . Bmd  2006  . Bilateral cateract surgery    . Bi-ventricular pacemaker insertion (crt-p)      a. 09/2013 s/p SJM 3242 CRT-P.  . Orif tibia plateau Right 12/04/2013    Procedure: OPEN REDUCTION INTERNAL FIXATION (ORIF) TIBIAL PLATEAU;  Surgeon: Verlee Rossetti, MD;  Location: [facility] ORS;  Service: Orthopedics;  Laterality: Right;  . Right heart catheterization N/A 09/08/2012    Procedure: RIGHT HEART CATH;  Surgeon: Dolores Patty, MD;  Location:  [facility] CATH LAB;  Service: Cardiovascular;  Laterality: N/A;  . Bi-ventricular pacemaker insertion N/A 10/01/2013    Procedure: BI-VENTRICULAR PACEMAKER INSERTION (CRT-P);  Surgeon: Duke Salvia, MD;  Location: [facility] CATH LAB;  Service: Cardiovascular;  Laterality: N/A;    Social History   Social History  . Marital Status: Divorced    Spouse Name: N/A  . Number of Children: 4  . Years of Education: N/A   Occupational History  . Retired Engineer, civil (consulting)    Social History Main Topics  . Smoking status: Former Games developer  . Smokeless tobacco: Never Used  . Alcohol Use: No  . Drug Use: No  . Sexual Activity: Not Currently   Other Topics Concern  . Not on file   Social History Narrative   Widowed.  Lives with her son.  Normally able to ambulate without assistance.  Quit smoking as a teenager.               Family History  Problem Relation Age of Onset  . Diabetes Other   . Hypertension Other   . Uterine cancer Other   . Diabetes Mother   . Heart attack Brother     Current Facility-Administered Medications  Medication Dose Route Frequency Provider Last Rate Last Dose  . acetaminophen (TYLENOL) tablet 650 mg  650 mg Oral Q6H PRN Vassie Loll, MD   650 mg at 01/11/16 1500  .  aspirin tablet 325 mg  325 mg Oral Daily Micki Riley, MD   325 mg at 01/11/16 1723  . carvedilol (COREG) tablet 12.5 mg  12.5 mg Oral BID WC Vassie Loll, MD   12.5 mg at 01/11/16 0934  . clopidogrel (PLAVIX) tablet 75 mg  75 mg Oral Daily Micki Riley, MD   75 mg at 01/11/16 1223  . enoxaparin (LOVENOX) injection 40 mg  40 mg Subcutaneous Daily Bobette Mo, MD   40 mg at 01/11/16 0931  . insulin aspart (novoLOG) injection 0-9 Units  0-9 Units Subcutaneous TID WC Bobette Mo, MD   3 Units at 01/11/16 1222  . levothyroxine (SYNTHROID, LEVOTHROID) tablet 137 mcg  137 mcg Oral QAC breakfast Vassie Loll, MD   137 mcg at 01/11/16 0934  . lisinopril (PRINIVIL,ZESTRIL) tablet 5 mg  5 mg Oral Daily Vassie Loll, MD   5 mg at 01/11/16 0931  . pantoprazole (PROTONIX) EC tablet 40 mg  40 mg Oral Q1200 Vassie Loll, MD   40 mg at 01/11/16 1223  . rosuvastatin (CRESTOR) tablet 10 mg  10 mg Oral q1800 Micki Riley, MD   10 mg at 01/11/16 1724  . senna-docusate (Senokot-S) tablet 1 tablet  1 tablet Oral QHS PRN Bobette Mo, MD      . sodium chloride flush (NS) 0.9 % injection 3 mL  3 mL Intravenous Q12H Bobette Mo, MD   3 mL at 01/11/16 1000    Allergies  Allergen Reactions  . Statins Other (See Comments)    Leg cramping  . Metformin And Related     Pt stopped it due to diarrhea  . Vytorin [Ezetimibe-Simvastatin] Other (See Comments)    Leg cramps     REVIEW OF SYSTEMS:  (Positives checked otherwise negative)  CARDIOVASCULAR:   [ ]  chest pain,  [ ]  chest pressure,  [ ]  palpitations,  [ ]  shortness of breath when laying flat,  [ ]  shortness of breath with exertion,   [ ]  pain in feet when walking,  [ ]  pain in feet when laying flat, [ ]  history of blood clot in veins (DVT),  [ ]  history of phlebitis,  [ ]  swelling in legs,  [ ]  varicose veins  PULMONARY:   [ ]  productive cough,  [ ]  asthma,  [ ]  wheezing  NEUROLOGIC:   [x]  weakness in arms or legs,  [ ]  numbness in arms or legs,  [ ]  difficulty speaking or slurred speech,  [ ]  temporary loss of vision in one eye,  [x]  dizziness [x]  unsteady gait  HEMATOLOGIC:   [ ]  bleeding problems,  [ ]  problems with blood clotting too easily  MUSCULOSKEL:   [ ]  joint pain, [ ]  joint swelling  GASTROINTEST:   [ ]  vomiting blood,  [ ]  blood in stool     GENITOURINARY:   [ ]  burning with urination,  [ ]  blood in urine  PSYCHIATRIC:   [ ]  history of major depression  INTEGUMENTARY:   [ ]  rashes,  [ ]  ulcers  CONSTITUTIONAL:   [ ]  fever,  [ ]  chills   For VQI Use Only  PRE-ADM LIVING: Home  AMB STATUS: Ambulatory  CAD Sx: None  PRIOR CHF: Moderate  STRESS TEST: [ ]  No, [ ]  Normal, [ ]  +  ischemia, [ ]  + MI, [ ]  Both, [x]  global hypokinesis   Physical Examination  Filed Vitals:   01/11/16 0900 01/11/16 1050  01/11/16 1425 01/11/16 1847  BP: 173/72 175/73 117/56 114/55  Pulse: 65 66 72 67  Temp: 98.1 F (36.7 C) 98.9 F (37.2 C) 98.2 F (36.8 C) 98 F (36.7 C)  TempSrc: Oral Oral Oral Oral  Resp: 18 18 18 18   Height: 5\' 3"  (1.6 m)     Weight: 139 lb 12.8 oz (63.413 kg)     SpO2: 100% 99% 99% 99%   Body mass index is 24.77 kg/(m^2).  General: A&O x 3, WD, elderly  Head: [place]/AT  Ear/Nose/Throat: Hearing grossly intact, nares w/o erythema or drainage, oropharynx w/o Erythema/Exudate, Mallampati score: 2  Eyes: PERRLA, EOMI, Post surg chg to pupils, left visual field defects  Neck: Supple, no nuchal rigidity, no palpable LAD  Pulmonary: Sym exp, good air movt, CTAB, no rales, rhonchi, & wheezing,  Cardiac: RRR, Nl S1, S2, no Murmurs, rubs or gallops, palpable L PPM  Vascular: Vessel Right Left  Radial Palpable Palpable  Brachial Palpable Palpable  Carotid Palpable, without bruit Palpable, without bruit  Aorta  Not palpable N/A  Femoral Palpable Palpable  Popliteal Not palpable Not palpable  PT Not Palpable Not Palpable  DP  Not Palpable Not Palpable   Gastrointestinal: soft, NTND, -G/R, - HSM, - masses, - CVAT B  Musculoskeletal: M/S 5/5 throughout , Extremities without ischemic changes, gait not tested  Neurologic: CN 2-12 intact except visual fields defects, Pain and light touch intact in extremities, Motor exam as listed above  Psychiatric: Judgment intact, Mood & affect appropriate for pt's clinical situation  Dermatologic: See M/S exam for extremity exam, no rashes otherwise noted  Lymph : No Cervical, Axillary, or Inguinal lymphadenopathy    Laboratory: CBC:    Component Value Date/Time   WBC 6.7 01/10/2016 2300   RBC 4.49 01/10/2016 2300   HGB 11.9* 01/10/2016 2300   HCT 36.7 01/10/2016 2300   PLT 256 01/10/2016 2300   MCV 81.7  01/10/2016 2300   MCH 26.5 01/10/2016 2300   MCHC 32.4 01/10/2016 2300   RDW 14.5 01/10/2016 2300   LYMPHSABS 3.3 01/10/2016 1644   MONOABS 0.2 01/10/2016 1644   EOSABS 0.0 01/10/2016 1644   BASOSABS 0.0 01/10/2016 1644    BMP:    Component Value Date/Time   NA 139 01/11/2016 0503   K 3.1* 01/11/2016 0503   CL 106 01/11/2016 0503   CO2 24 01/11/2016 0503   GLUCOSE 104* 01/11/2016 0503   GLUCOSE 112* 08/14/2006 1339   BUN 9 01/11/2016 0503   CREATININE 0.92 01/11/2016 0503   CALCIUM 9.4 01/11/2016 0503   GFRNONAA 58* 01/11/2016 0503   GFRAA >60 01/11/2016 0503    Coagulation: Lab Results  Component Value Date   INR 1.08 01/10/2016   INR 0.98 12/03/2013   No results found for: PTT  Lipids:    Component Value Date/Time   CHOL 218* 01/11/2016 0503   TRIG 178* 01/11/2016 0503   HDL 30* 01/11/2016 0503   CHOLHDL 7.3 01/11/2016 0503   VLDL 36 01/11/2016 0503   LDLCALC 152* 01/11/2016 0503   LDLDIRECT 141.0 01/10/2016 1416     Radiology: Ct Angio Head W/cm &/or Wo Cm  01/11/2016  CLINICAL DATA:  82 year old female with ataxia and right side headache for multiple days. Right PCA infarct on noncontrast head CT yesterday. Initial encounter. EXAM: CT ANGIOGRAPHY HEAD AND NECK TECHNIQUE: Multidetector CT imaging of the head and neck was performed using the standard protocol during bolus administration of intravenous contrast. Multiplanar CT image reconstructions and MIPs  were obtained to evaluate the vascular anatomy. Carotid stenosis measurements (when applicable) are obtained utilizing NASCET criteria, using the distal internal carotid diameter as the denominator. CONTRAST:  50 mL Isovue 370 COMPARISON:  Noncontrast head CT 01/10/2016. FINDINGS: CTA NECK Skeleton: Multilevel degenerative changes in the cervical spine. Partially visible thoracic levoconvex scoliosis. Absent dentition. No acute osseous abnormality identified. Minimal posterior right ethmoid sinus mucosal  thickening. Other Visualized paranasal sinuses and mastoids are clear. Other neck: Left chest cardiac pacemaker type device. Negative lung apices. No superior mediastinal lymphadenopathy. Small volume of gas distending the upper thoracic esophagus, nonspecific. Negative thyroid, larynx (glottis is closed), pharynx, parapharyngeal spaces, retropharyngeal space, sublingual space, submandibular glands and parotid glands. No cervical lymphadenopathy. Aortic arch: 3 vessel arch configuration. Mild mostly soft arch atherosclerotic plaque. Right carotid system: Tortuous proximal right CCA. Calcified plaque at the posterior right ICA origin and bulb resulting in less than 50 % stenosis with respect to the distal vessel. Mildly tortuous cervical right ICA. Left carotid system: Tortuous left CCA. Bulky calcified plaque at the left carotid bifurcation and left ICA origin resulting in radiographic string sign stenosis over a segment of 8-10 mm (series 403, image 117). The left ICA remains patent, and is mildly tortuous distal to the bulb. Vertebral arteries:No proximal right subclavian artery stenosis despite calcified plaque. Normal right vertebral artery origin. Tortuous right V2 segment. Otherwise negative right vertebral artery to the skullbase. 50% stenosis of the proximal left subclavian artery related the calcified plaque. Dense calcified plaque at the left vertebral artery origin resulting in severe stenosis (series 403, image 136). The left vertebral artery rib remains patent. The vertebral arteries are fairly codominant. The left V2 segment is tortuous. Otherwise negative left vertebral artery to the skullbase. CTA HEAD Posterior circulation: No distal right vertebral artery stenosis. Calcified plaque in the proximal left V4 segment which is not significant. Normal left PICA and right AICA origins. Normal vertebrobasilar junction. No basilar stenosis. Normal SCA and PCA origins. However, there is high-grade stenosis  in the right PCA P1 segment (series 405, image 22), and the right PCA occludes in the P2 segment best seen on series 403 images 93 through 95. Furthermore there are tandem moderate to severe stenoses in the left PCA P1 and P2 segments (series 406, image 31). Anterior circulation: Both ICA siphons are patent, but there is severe bilateral calcified atherosclerosis resulting an hemodynamically significant stenosis at the distal cavernous and proximal supraclinoid segments on the left as well as the proximal supraclinoid segment on the right. Right ophthalmic artery origin remains normal. Carotid termini remain patent. There is a mild stenosis at the left ACA origin. A1 segments, anterior communicating artery, and bilateral ACA branches then are within normal limits. Left MCA origin is normal. Left M1 segment, left MCA bifurcation, and left MCA branches are within normal limits. Right MCA origin, right M1 segment, bifurcation, and right MCA branches are within normal limits. Venous sinuses: Patent. Anatomic variants: None. Delayed phase: Cytotoxic edema in the right PCA territory involving the occipital lobe, the mesial temporal lobe, an the right hippocampal formation appears stable. No associated hemorrhage. No significant mass effect. Stable gray-white matter differentiation elsewhere. No abnormal enhancement identified. IMPRESSION: 1. Positive for emergent large vessel occlusion of the right PCA in the P2 segment. High-grade stenosis in the right P1 segment. 2. Other hemodynamically significant intracranial stenoses: - Bilateral ICA siphons, related to calcified plaque. - Left PCA P1 and P2 segments. 3. High-grade RADIOGRAPHIC STRING SIGN stenosis of the  left ICA origin. 4. Severe stenosis of the left vertebral artery origin due to calcified plaque. 5. Right carotid bifurcation atherosclerosis without significant stenosis. 50% stenosis proximal left subclavian artery. 6. Stable CT appearance of the confluent right  PCA territory infarcts since yesterday. No associated hemorrhage or significant mass effect. No new intracranial abnormality identified. Study discussed by telephone with Stroke Neurology Provider Annie Main NP on 01/11/2016 at 11:03 . Electronically Signed   By: Odessa Fleming M.D.   On: 01/11/2016 11:04   Dg Chest 2 View  01/10/2016  CLINICAL DATA:  Dizziness and lethargy EXAM: CHEST  2 VIEW COMPARISON:  Jan 01, 2016 FINDINGS: The pacemaker is stable. Mild vascular crowding in the medial right lung base. The heart, hila, mediastinum, lungs, and pleura are otherwise stable. Anterior wedging of 2 lower thoracic vertebral bodies. Only 1 of these vertebral bodies was involved in February of 2015. IMPRESSION: Anterior wedging of a lower thoracic vertebral body, new since February 2015 but otherwise age indeterminate. Recommend clinical correlation. No other acute abnormalities. Electronically Signed   By: Gerome Sam III M.D   On: 01/10/2016 22:15   Ct Head Wo Contrast  01/10/2016  CLINICAL DATA:  Right-sided headaches and ataxia for 3 weeks. Concern for stroke. EXAM: CT HEAD WITHOUT CONTRAST TECHNIQUE: Contiguous axial images were obtained from the base of the skull through the vertex without intravenous contrast. COMPARISON:  None. FINDINGS: There is cytotoxic edema in the medial right occipital lobe and posterior right temporal lobe consistent with a moderate-sized, likely subacute PCA infarct. There is no evidence of intracranial hemorrhage, midline shift, mass, or extra-axial fluid collection. There is mild global cerebral atrophy. Periventricular white-matter hypodensities are nonspecific but compatible with mild chronic small vessel ischemic disease. No acute abnormality identified in the imaged orbits. The visualized paranasal sinuses and mastoid air cells are clear. Calcified atherosclerosis is noted at the skullbase. No acute osseous abnormality. IMPRESSION: 1. Moderate-sized subacute right PCA infarct. No  evidence of hemorrhage. 2. Mild chronic small vessel ischemic disease and cerebral atrophy. These results will be called to the ordering clinician or representative by the Radiologist Assistant, and communication documented in the PACS or zVision Dashboard. Electronically Signed   By: Sebastian Ache M.D.   On: 01/10/2016 15:44   Ct Angio Neck W/cm &/or Wo/cm  01/11/2016  CLINICAL DATA:  82 year old female with ataxia and right side headache for multiple days. Right PCA infarct on noncontrast head CT yesterday. Initial encounter. EXAM: CT ANGIOGRAPHY HEAD AND NECK TECHNIQUE: Multidetector CT imaging of the head and neck was performed using the standard protocol during bolus administration of intravenous contrast. Multiplanar CT image reconstructions and MIPs were obtained to evaluate the vascular anatomy. Carotid stenosis measurements (when applicable) are obtained utilizing NASCET criteria, using the distal internal carotid diameter as the denominator. CONTRAST:  50 mL Isovue 370 COMPARISON:  Noncontrast head CT 01/10/2016. FINDINGS: CTA NECK Skeleton: Multilevel degenerative changes in the cervical spine. Partially visible thoracic levoconvex scoliosis. Absent dentition. No acute osseous abnormality identified. Minimal posterior right ethmoid sinus mucosal thickening. Other Visualized paranasal sinuses and mastoids are clear. Other neck: Left chest cardiac pacemaker type device. Negative lung apices. No superior mediastinal lymphadenopathy. Small volume of gas distending the upper thoracic esophagus, nonspecific. Negative thyroid, larynx (glottis is closed), pharynx, parapharyngeal spaces, retropharyngeal space, sublingual space, submandibular glands and parotid glands. No cervical lymphadenopathy. Aortic arch: 3 vessel arch configuration. Mild mostly soft arch atherosclerotic plaque. Right carotid system: Tortuous proximal right CCA. Calcified plaque  at the posterior right ICA origin and bulb resulting in less  than 50 % stenosis with respect to the distal vessel. Mildly tortuous cervical right ICA. Left carotid system: Tortuous left CCA. Bulky calcified plaque at the left carotid bifurcation and left ICA origin resulting in radiographic string sign stenosis over a segment of 8-10 mm (series 403, image 117). The left ICA remains patent, and is mildly tortuous distal to the bulb. Vertebral arteries:No proximal right subclavian artery stenosis despite calcified plaque. Normal right vertebral artery origin. Tortuous right V2 segment. Otherwise negative right vertebral artery to the skullbase. 50% stenosis of the proximal left subclavian artery related the calcified plaque. Dense calcified plaque at the left vertebral artery origin resulting in severe stenosis (series 403, image 136). The left vertebral artery rib remains patent. The vertebral arteries are fairly codominant. The left V2 segment is tortuous. Otherwise negative left vertebral artery to the skullbase. CTA HEAD Posterior circulation: No distal right vertebral artery stenosis. Calcified plaque in the proximal left V4 segment which is not significant. Normal left PICA and right AICA origins. Normal vertebrobasilar junction. No basilar stenosis. Normal SCA and PCA origins. However, there is high-grade stenosis in the right PCA P1 segment (series 405, image 22), and the right PCA occludes in the P2 segment best seen on series 403 images 93 through 95. Furthermore there are tandem moderate to severe stenoses in the left PCA P1 and P2 segments (series 406, image 31). Anterior circulation: Both ICA siphons are patent, but there is severe bilateral calcified atherosclerosis resulting an hemodynamically significant stenosis at the distal cavernous and proximal supraclinoid segments on the left as well as the proximal supraclinoid segment on the right. Right ophthalmic artery origin remains normal. Carotid termini remain patent. There is a mild stenosis at the left ACA  origin. A1 segments, anterior communicating artery, and bilateral ACA branches then are within normal limits. Left MCA origin is normal. Left M1 segment, left MCA bifurcation, and left MCA branches are within normal limits. Right MCA origin, right M1 segment, bifurcation, and right MCA branches are within normal limits. Venous sinuses: Patent. Anatomic variants: None. Delayed phase: Cytotoxic edema in the right PCA territory involving the occipital lobe, the mesial temporal lobe, an the right hippocampal formation appears stable. No associated hemorrhage. No significant mass effect. Stable gray-white matter differentiation elsewhere. No abnormal enhancement identified. IMPRESSION: 1. Positive for emergent large vessel occlusion of the right PCA in the P2 segment. High-grade stenosis in the right P1 segment. 2. Other hemodynamically significant intracranial stenoses: - Bilateral ICA siphons, related to calcified plaque. - Left PCA P1 and P2 segments. 3. High-grade RADIOGRAPHIC STRING SIGN stenosis of the left ICA origin. 4. Severe stenosis of the left vertebral artery origin due to calcified plaque. 5. Right carotid bifurcation atherosclerosis without significant stenosis. 50% stenosis proximal left subclavian artery. 6. Stable CT appearance of the confluent right PCA territory infarcts since yesterday. No associated hemorrhage or significant mass effect. No new intracranial abnormality identified. Study discussed by telephone with Stroke Neurology Provider Annie Main NP on 01/11/2016 at 11:03 . Electronically Signed   By: Odessa Fleming M.D.   On: 01/11/2016 11:04   Based on the review of the CTA neck, the patient has a >80% calcific L ICA stenoses.  She also has  significant calcific atherosclerosis in her intracranial arteries.   Medical Decision Making  [name] [name] is a 82 y.o. female who presents with: R PCA CVA due to R PCA occlusion, L ICA stenosis >  80%, R ICA stenosis <50%   Obviously the high grade L  ICA stenosis did not cause the R posterior CVA, so I would treat the L ICA stenosis as asx for for now.  As the L ICA stenosis is high grade, intervention in 4 weeks may need to be considered.  Unfortunately, I have had a prior patient with similar presentation.  That patient unfortunately had a devastating stroke in the perioperative period due to severe intracranial disease, so I am reluctant to offer this patient anything, as I can't treat the intracranial segments.  In cases of combined extracranial and intracranial disease, I routinely refer these patient to Neuro Interventional Radiology for further evaluation and treatment.  I discussed in depth with the patient the nature of atherosclerosis, and emphasized the importance of maximal medical management including strict control of blood pressure, blood glucose, and lipid levels, obtaining regular exercise, antiplatelet agents, and cessation of smoking.   The patient is currently on a statin: Lipitor.  The patient is currently on an anti-platelet: ASA and plavix.  The patient is aware that without maximal medical management the underlying atherosclerotic disease process will progress, limiting the benefit of any interventions.  Thank you for allowing [place] to participate in this patient's care.   Leonides Sake, MD [facility] Office: [phone] Pager: [phone]  01/11/2016, 8:18 PM

## 2016-01-11 NOTE — Care Management Note (Signed)
Case Management Note  Patient Details  Name: [name] [name] MRN: [id number] Date of Birth: [birth year]  Subjective/Objective:    Pt admitted with CVA. She is from home with family.                Action/Plan: Awaiting PT/OT recs. CM following for discharge disposition.   Expected Discharge Date:                  Expected Discharge Plan:     In-House Referral:     Discharge planning Services     Post Acute Care Choice:    Choice offered to:     DME Arranged:    DME Agency:     HH Arranged:    HH Agency:     Status of Service:  In process, will continue to follow  Medicare Important Message Given:    Date Medicare IM Given:    Medicare IM give by:    Date Additional Medicare IM Given:    Additional Medicare Important Message give by:     If discussed at Long Length of Stay Meetings, dates discussed:    Additional Comments:  Kermit Balo, RN 01/11/2016, 11:54 AM

## 2016-01-11 NOTE — Progress Notes (Addendum)
STROKE TEAM PROGRESS NOTE   HISTORY OF PRESENT ILLNESS (per record) Jeanann Balinski is an 82 y.o. female history diabetes mellitus, hypertension, hyperlipidemia, coronary artery disease, hypothyroidism and CHF, presenting with recurrent episodes of dizziness and unsteady gait for 2 weeks. She describes the dizziness as a drunk feeling she gets when she stands and attempt to walk. No change in speech as been noted. Family has noted a droop of the left side of her face. She has not experienced focal weakness of extremities, nor numbness. No significant mental status changes of the noted other than just not being herself over the past 2 weeks. CT scan of her head was obtained today which showed right probably subacute ischemic stroke involving the PCA territory. No cerebellar or brainstem acute lesion was noted. MRI could not be obtained because patient has an implanted pacemaker. NIH stroke score was 4. She was LKW 12/27/2015, time unclear. Patient was not administered IV t-PA secondary to beyond time window for treatment consideration. She was admitted for further evaluation and treatment.   SUBJECTIVE (INTERVAL HISTORY) Patient states she is still dizzy but only slightly improved. She denies prior history of strokes or TIAs. She has been to the ED and primary physician a few times since her symptoms began. Reviewed her entire history of presentation and 14 system review of systems is positive only for as documented above.   OBJECTIVE Temp:  [97.9 F (36.6 C)-98.9 F (37.2 C)] 98.9 F (37.2 C) (06/01 1050) Pulse Rate:  [56-69] 66 (06/01 1050) Cardiac Rhythm:  [-] Ventricular paced (06/01 0748) Resp:  [11-20] 18 (06/01 1050) BP: (116-189)/(57-92) 175/73 mmHg (06/01 1050) SpO2:  [98 %-100 %] 99 % (06/01 1050) Weight:  [62.596 kg (138 lb)-63.413 kg (139 lb 12.8 oz)] 63.413 kg (139 lb 12.8 oz) (06/01 0900)  CBC:  Recent Labs Lab 01/10/16 1644 01/10/16 2300  WBC 6.6 6.7  NEUTROABS 3.0  --   HGB  12.7 11.9*  HCT 39.3 36.7  MCV 81.7 81.7  PLT 292 256    Basic Metabolic Panel:  Recent Labs Lab 01/10/16 1644 01/10/16 2300 01/11/16 0503  NA 138  --  139  K 3.8  --  3.1*  CL 105  --  106  CO2 25  --  24  GLUCOSE 133*  --  104*  BUN 10  --  9  CREATININE 1.00 0.94 0.92  CALCIUM 9.6  --  9.4  MG  --   --  1.5*  PHOS  --   --  3.4    Lipid Panel:    Component Value Date/Time   CHOL 218* 01/11/2016 0503   TRIG 178* 01/11/2016 0503   HDL 30* 01/11/2016 0503   CHOLHDL 7.3 01/11/2016 0503   VLDL 36 01/11/2016 0503   LDLCALC 152* 01/11/2016 0503   HgbA1c:  Lab Results  Component Value Date   HGBA1C 11.5* 01/10/2016   Urine Drug Screen: No results found for: LABOPIA, COCAINSCRNUR, LABBENZ, AMPHETMU, THCU, LABBARB    IMAGING  Ct Angio Head W/cm &/or Wo Cm  01/11/2016  CLINICAL DATA:  82 year old female with ataxia and right side headache for multiple days. Right PCA infarct on noncontrast head CT yesterday. Initial encounter. EXAM: CT ANGIOGRAPHY HEAD AND NECK TECHNIQUE: Multidetector CT imaging of the head and neck was performed using the standard protocol during bolus administration of intravenous contrast. Multiplanar CT image reconstructions and MIPs were obtained to evaluate the vascular anatomy. Carotid stenosis measurements (when applicable) are obtained utilizing NASCET criteria,  using the distal internal carotid diameter as the denominator. CONTRAST:  50 mL Isovue 370 COMPARISON:  Noncontrast head CT 01/10/2016. FINDINGS: CTA NECK Skeleton: Multilevel degenerative changes in the cervical spine. Partially visible thoracic levoconvex scoliosis. Absent dentition. No acute osseous abnormality identified. Minimal posterior right ethmoid sinus mucosal thickening. Other Visualized paranasal sinuses and mastoids are clear. Other neck: Left chest cardiac pacemaker type device. Negative lung apices. No superior mediastinal lymphadenopathy. Small volume of gas distending the upper  thoracic esophagus, nonspecific. Negative thyroid, larynx (glottis is closed), pharynx, parapharyngeal spaces, retropharyngeal space, sublingual space, submandibular glands and parotid glands. No cervical lymphadenopathy. Aortic arch: 3 vessel arch configuration. Mild mostly soft arch atherosclerotic plaque. Right carotid system: Tortuous proximal right CCA. Calcified plaque at the posterior right ICA origin and bulb resulting in less than 50 % stenosis with respect to the distal vessel. Mildly tortuous cervical right ICA. Left carotid system: Tortuous left CCA. Bulky calcified plaque at the left carotid bifurcation and left ICA origin resulting in radiographic string sign stenosis over a segment of 8-10 mm (series 403, image 117). The left ICA remains patent, and is mildly tortuous distal to the bulb. Vertebral arteries:No proximal right subclavian artery stenosis despite calcified plaque. Normal right vertebral artery origin. Tortuous right V2 segment. Otherwise negative right vertebral artery to the skullbase. 50% stenosis of the proximal left subclavian artery related the calcified plaque. Dense calcified plaque at the left vertebral artery origin resulting in severe stenosis (series 403, image 136). The left vertebral artery rib remains patent. The vertebral arteries are fairly codominant. The left V2 segment is tortuous. Otherwise negative left vertebral artery to the skullbase. CTA HEAD Posterior circulation: No distal right vertebral artery stenosis. Calcified plaque in the proximal left V4 segment which is not significant. Normal left PICA and right AICA origins. Normal vertebrobasilar junction. No basilar stenosis. Normal SCA and PCA origins. However, there is high-grade stenosis in the right PCA P1 segment (series 405, image 22), and the right PCA occludes in the P2 segment best seen on series 403 images 93 through 95. Furthermore there are tandem moderate to severe stenoses in the left PCA P1 and P2  segments (series 406, image 31). Anterior circulation: Both ICA siphons are patent, but there is severe bilateral calcified atherosclerosis resulting an hemodynamically significant stenosis at the distal cavernous and proximal supraclinoid segments on the left as well as the proximal supraclinoid segment on the right. Right ophthalmic artery origin remains normal. Carotid termini remain patent. There is a mild stenosis at the left ACA origin. A1 segments, anterior communicating artery, and bilateral ACA branches then are within normal limits. Left MCA origin is normal. Left M1 segment, left MCA bifurcation, and left MCA branches are within normal limits. Right MCA origin, right M1 segment, bifurcation, and right MCA branches are within normal limits. Venous sinuses: Patent. Anatomic variants: None. Delayed phase: Cytotoxic edema in the right PCA territory involving the occipital lobe, the mesial temporal lobe, an the right hippocampal formation appears stable. No associated hemorrhage. No significant mass effect. Stable gray-white matter differentiation elsewhere. No abnormal enhancement identified. IMPRESSION: 1. Positive for emergent large vessel occlusion of the right PCA in the P2 segment. High-grade stenosis in the right P1 segment. 2. Other hemodynamically significant intracranial stenoses: - Bilateral ICA siphons, related to calcified plaque. - Left PCA P1 and P2 segments. 3. High-grade RADIOGRAPHIC STRING SIGN stenosis of the left ICA origin. 4. Severe stenosis of the left vertebral artery origin due to calcified plaque. 5.  Right carotid bifurcation atherosclerosis without significant stenosis. 50% stenosis proximal left subclavian artery. 6. Stable CT appearance of the confluent right PCA territory infarcts since yesterday. No associated hemorrhage or significant mass effect. No new intracranial abnormality identified. Study discussed by telephone with Stroke Neurology Provider Annie Main NP on 01/11/2016 at  11:03 . Electronically Signed   By: Odessa Fleming M.D.   On: 01/11/2016 11:04   Dg Chest 2 View  01/10/2016  CLINICAL DATA:  Dizziness and lethargy EXAM: CHEST  2 VIEW COMPARISON:  Jan 01, 2016 FINDINGS: The pacemaker is stable. Mild vascular crowding in the medial right lung base. The heart, hila, mediastinum, lungs, and pleura are otherwise stable. Anterior wedging of 2 lower thoracic vertebral bodies. Only 1 of these vertebral bodies was involved in February of 2015. IMPRESSION: Anterior wedging of a lower thoracic vertebral body, new since February 2015 but otherwise age indeterminate. Recommend clinical correlation. No other acute abnormalities. Electronically Signed   By: Gerome Sam III M.D   On: 01/10/2016 22:15   Ct Head Wo Contrast  01/10/2016  CLINICAL DATA:  Right-sided headaches and ataxia for 3 weeks. Concern for stroke. EXAM: CT HEAD WITHOUT CONTRAST TECHNIQUE: Contiguous axial images were obtained from the base of the skull through the vertex without intravenous contrast. COMPARISON:  None. FINDINGS: There is cytotoxic edema in the medial right occipital lobe and posterior right temporal lobe consistent with a moderate-sized, likely subacute PCA infarct. There is no evidence of intracranial hemorrhage, midline shift, mass, or extra-axial fluid collection. There is mild global cerebral atrophy. Periventricular white-matter hypodensities are nonspecific but compatible with mild chronic small vessel ischemic disease. No acute abnormality identified in the imaged orbits. The visualized paranasal sinuses and mastoid air cells are clear. Calcified atherosclerosis is noted at the skullbase. No acute osseous abnormality. IMPRESSION: 1. Moderate-sized subacute right PCA infarct. No evidence of hemorrhage. 2. Mild chronic small vessel ischemic disease and cerebral atrophy. These results will be called to the ordering clinician or representative by the Radiologist Assistant, and communication documented in  the PACS or zVision Dashboard. Electronically Signed   By: Sebastian Ache M.D.   On: 01/10/2016 15:44   Ct Angio Neck W/cm &/or Wo/cm  01/11/2016  CLINICAL DATA:  82 year old female with ataxia and right side headache for multiple days. Right PCA infarct on noncontrast head CT yesterday. Initial encounter. EXAM: CT ANGIOGRAPHY HEAD AND NECK TECHNIQUE: Multidetector CT imaging of the head and neck was performed using the standard protocol during bolus administration of intravenous contrast. Multiplanar CT image reconstructions and MIPs were obtained to evaluate the vascular anatomy. Carotid stenosis measurements (when applicable) are obtained utilizing NASCET criteria, using the distal internal carotid diameter as the denominator. CONTRAST:  50 mL Isovue 370 COMPARISON:  Noncontrast head CT 01/10/2016. FINDINGS: CTA NECK Skeleton: Multilevel degenerative changes in the cervical spine. Partially visible thoracic levoconvex scoliosis. Absent dentition. No acute osseous abnormality identified. Minimal posterior right ethmoid sinus mucosal thickening. Other Visualized paranasal sinuses and mastoids are clear. Other neck: Left chest cardiac pacemaker type device. Negative lung apices. No superior mediastinal lymphadenopathy. Small volume of gas distending the upper thoracic esophagus, nonspecific. Negative thyroid, larynx (glottis is closed), pharynx, parapharyngeal spaces, retropharyngeal space, sublingual space, submandibular glands and parotid glands. No cervical lymphadenopathy. Aortic arch: 3 vessel arch configuration. Mild mostly soft arch atherosclerotic plaque. Right carotid system: Tortuous proximal right CCA. Calcified plaque at the posterior right ICA origin and bulb resulting in less than 50 % stenosis with respect  to the distal vessel. Mildly tortuous cervical right ICA. Left carotid system: Tortuous left CCA. Bulky calcified plaque at the left carotid bifurcation and left ICA origin resulting in radiographic  string sign stenosis over a segment of 8-10 mm (series 403, image 117). The left ICA remains patent, and is mildly tortuous distal to the bulb. Vertebral arteries:No proximal right subclavian artery stenosis despite calcified plaque. Normal right vertebral artery origin. Tortuous right V2 segment. Otherwise negative right vertebral artery to the skullbase. 50% stenosis of the proximal left subclavian artery related the calcified plaque. Dense calcified plaque at the left vertebral artery origin resulting in severe stenosis (series 403, image 136). The left vertebral artery rib remains patent. The vertebral arteries are fairly codominant. The left V2 segment is tortuous. Otherwise negative left vertebral artery to the skullbase. CTA HEAD Posterior circulation: No distal right vertebral artery stenosis. Calcified plaque in the proximal left V4 segment which is not significant. Normal left PICA and right AICA origins. Normal vertebrobasilar junction. No basilar stenosis. Normal SCA and PCA origins. However, there is high-grade stenosis in the right PCA P1 segment (series 405, image 22), and the right PCA occludes in the P2 segment best seen on series 403 images 93 through 95. Furthermore there are tandem moderate to severe stenoses in the left PCA P1 and P2 segments (series 406, image 31). Anterior circulation: Both ICA siphons are patent, but there is severe bilateral calcified atherosclerosis resulting an hemodynamically significant stenosis at the distal cavernous and proximal supraclinoid segments on the left as well as the proximal supraclinoid segment on the right. Right ophthalmic artery origin remains normal. Carotid termini remain patent. There is a mild stenosis at the left ACA origin. A1 segments, anterior communicating artery, and bilateral ACA branches then are within normal limits. Left MCA origin is normal. Left M1 segment, left MCA bifurcation, and left MCA branches are within normal limits. Right MCA  origin, right M1 segment, bifurcation, and right MCA branches are within normal limits. Venous sinuses: Patent. Anatomic variants: None. Delayed phase: Cytotoxic edema in the right PCA territory involving the occipital lobe, the mesial temporal lobe, an the right hippocampal formation appears stable. No associated hemorrhage. No significant mass effect. Stable gray-white matter differentiation elsewhere. No abnormal enhancement identified. IMPRESSION: 1. Positive for emergent large vessel occlusion of the right PCA in the P2 segment. High-grade stenosis in the right P1 segment. 2. Other hemodynamically significant intracranial stenoses: - Bilateral ICA siphons, related to calcified plaque. - Left PCA P1 and P2 segments. 3. High-grade RADIOGRAPHIC STRING SIGN stenosis of the left ICA origin. 4. Severe stenosis of the left vertebral artery origin due to calcified plaque. 5. Right carotid bifurcation atherosclerosis without significant stenosis. 50% stenosis proximal left subclavian artery. 6. Stable CT appearance of the confluent right PCA territory infarcts since yesterday. No associated hemorrhage or significant mass effect. No new intracranial abnormality identified. Study discussed by telephone with Stroke Neurology Provider Annie Main NP on 01/11/2016 at 11:03 . Electronically Signed   By: Odessa Fleming M.D.   On: 01/11/2016 11:04       PHYSICAL EXAM Pleasant elderly lady not in distress. . Afebrile. Head is nontraumatic. Neck is supple without bruit.    Cardiac exam no murmur or gallop. Lungs are clear to auscultation. Distal pulses are well felt. Neurological Exam :  Awake alert oriented 3. Diminished attention and recall. No dysarthria or aphasia. Pupils equal reactive. Fundi were not visualized. Vision acuity seems adequate. Dense left homonymous hemianopsia. Face  is symmetric without weakness. Tongue is midline. Motor system exam reveals no upper or lower extremity drift. No focal weakness. Mild tremor of  outstretched right upper extremity. Deep tendon reflexes are symmetric. Plantars are downgoing. Sensation is intact. Coordination is accurate. Gait was not tested ASSESSMENT/PLAN Ms. Makynli Stills is a 82 y.o. female with history of diabetes mellitus, hypertension, hyperlipidemia, coronary artery disease, hypothyroidism and CHF presenting with recurrent episodes of dizziness and unsteady gait for 2 weeks. She did not receive IV t-PA due to delay in arrival.   Stroke:  right PCA territory infarcts in setting of R PCA occlusion, infarct thrombotic secondary to large vessel disease    Resultant  left homonymous hemianopsia  CTA H&N   R PCA territory infarcts. R P2 occlusion. R P1 high grade stenosis. L ICA string sign at origin. Hemodynamically significant stenosis: B ICA siphons, L P1 & P2, L VA origin.   2D Echo  Pending  Canceled carotid doppler given CTA N   LDL 152  HgbA1c 11.5  Lovenox 40 mg sq daily for VTE prophylaxis  Diet heart healthy/carb modified Room service appropriate?: Yes; Fluid consistency:: Thin  aspirin 81 mg daily prior to admission, now on clopidogrel 75 mg daily  Patient counseled to be compliant with her antithrombotic medications  Ongoing aggressive stroke risk factor management  Therapy recommendations:  pending   Disposition:  pending   Carotid stenosis  L ICA w/ string sign per CTA    Hypertension   Stable Permissive hypertension (OK if < 220/120) but gradually normalize in 5-7 days  Hyperlipidemia  Home meds:  lipitor 10  Recommend resumption in hospital (note hx leg problems on statins)  LDL 152, goal < 70  Change to crestor 10 mg  Continue statin at discharge  Diabetes type II  HgbA1c 11.5, goal < 7.0  Uncontrolled  Other Stroke Risk Factors  Advanced age  Former Cigarette smoker  Coronary artery disease  Chronic combined systolic and diastolic CHF, NYHA class 3 (HCC), known EF 25-30%  Other Active  Problems  Hypothyroidism  Hospital day # 1  I have personally examined this patient, reviewed notes, independently viewed imaging studies, participated in medical decision making and plan of care. I have made any additions or clarifications directly to the above note. She presented with recurrent episodes of unsteady gait and dizziness for 2 weeks and CT scan shows a subacute right posterior cerebral artery infarct etiology likely intracranial atherosclerosis. She remains at risk for neurological worsening, recurrent stroke, TIA and needs ongoing stroke evaluation and aggressive risk factor modification. Recommend aspirin and Plavix. Vascular surgery consult she may not be a good surgical candidate and may need elective left carotid stent .Greater than 50% time during this 35 minute visit was spent on counseling and coordination of care about stroke risk and prevention. Discussed with Dr. Budd Palmer, MD Medical Director [facility] St[facility] Pager: [phone] 01/11/2016 4:52 PM     To contact Stroke Continuity provider, please refer to [email or web address]. After hours, contact General Neurology

## 2016-01-12 ENCOUNTER — Inpatient Hospital Stay (HOSPITAL_COMMUNITY): Payer: Medicare Other

## 2016-01-12 DIAGNOSIS — I10 Essential (primary) hypertension: Secondary | ICD-10-CM | POA: Insufficient documentation

## 2016-01-12 DIAGNOSIS — I6329 Cerebral infarction due to unspecified occlusion or stenosis of other precerebral arteries: Secondary | ICD-10-CM

## 2016-01-12 DIAGNOSIS — R270 Ataxia, unspecified: Secondary | ICD-10-CM

## 2016-01-12 DIAGNOSIS — I6522 Occlusion and stenosis of left carotid artery: Secondary | ICD-10-CM

## 2016-01-12 DIAGNOSIS — I6350 Cerebral infarction due to unspecified occlusion or stenosis of unspecified cerebral artery: Secondary | ICD-10-CM

## 2016-01-12 LAB — ECHOCARDIOGRAM COMPLETE
Height: 63 in
WEIGHTICAEL: 2236.8 [oz_av]

## 2016-01-12 LAB — GLUCOSE, CAPILLARY
GLUCOSE-CAPILLARY: 132 mg/dL — AB (ref 65–99)
Glucose-Capillary: 195 mg/dL — ABNORMAL HIGH (ref 65–99)

## 2016-01-12 LAB — HEMOGLOBIN A1C
Hgb A1c MFr Bld: 11.4 % — ABNORMAL HIGH (ref 4.8–5.6)
Mean Plasma Glucose: 280 mg/dL

## 2016-01-12 MED ORDER — CLOPIDOGREL BISULFATE 75 MG PO TABS: 75.0000 mg | ORAL_TABLET | Freq: Every day | ORAL | Status: DC

## 2016-01-12 MED ORDER — ASPIRIN 325 MG PO TABS: 325.0000 mg | ORAL_TABLET | Freq: Every day | ORAL | Status: DC

## 2016-01-12 MED ORDER — ROSUVASTATIN CALCIUM 20 MG PO TABS: 20.0000 mg | ORAL_TABLET | Freq: Every day | ORAL | Status: DC

## 2016-01-12 MED ORDER — INSULIN GLARGINE 100 UNIT/ML SOLOSTAR PEN: 18.0000 [IU] | PEN_INJECTOR | Freq: Every morning | SUBCUTANEOUS | Status: DC

## 2016-01-12 MED ORDER — COENZYME Q10 30 MG PO CAPS: 30.0000 mg | ORAL_CAPSULE | Freq: Every day | ORAL | Status: DC

## 2016-01-12 NOTE — Progress Notes (Signed)
Occupational Therapy Treatment Patient Details Name: [name] [name] MRN: [id number] DOB: 1933-09-09 Today's Date: 01/12/2016    History of present illness Pt is a 82 y/o female who presents s/p unresponsive event at home. Upon workup in ED, CT revealed subacute R ischemic stroke. No MRI pending as pt has pacemaker.    OT comments  Pt participated well in OT.  Needs min A for transfers and tests to have L VFD as well as overattends to R  Follow Up Recommendations  Home health OT;Supervision/Assistance - 24 hour (must have 24/7 or will need SNF)    Equipment Recommendations  3 in 1 bedside comode    Recommendations for Other Services      Precautions / Restrictions Precautions Precautions: Fall Restrictions Weight Bearing Restrictions: No       Mobility Bed Mobility         Supine to sit: Supervision Sit to supine: Min assist   General bed mobility comments: assist for legs when getting back to bed:  transferred to bed for ECHO test  Transfers   Equipment used: Rolling walker (2 wheeled)   Sit to Stand: Min guard Stand pivot transfers: Min assist       General transfer comment: cues for UE placement; assist to turn walker and steadying    Balance               Standing balance comment: min guard for safety                   ADL           Upper Body Bathing: Set up;Sitting   Lower Body Bathing: Min guard;Sit to/from stand   Upper Body Dressing : Minimal assistance;Sitting (to tie gown)   Lower Body Dressing: Min guard;Sit to/from stand   Toilet Transfer: Minimal assistance;Stand-pivot;RW (chair to bed to chair)             General ADL Comments: performed ADL from EOB; pt reached to L to locate objects but when looking at vision, she did not see finger until approximately midline.  Poor spatial awareness with transfer to bed to L side.        Vision                 Additional Comments: pt tests with L visual field cut and  she overattends to R, but she does cross midline and reach for objects past midline   Perception     Praxis      Cognition   Behavior During Therapy: [facility] for tasks assessed/performed Overall Cognitive Status: No family/caregiver present to determine baseline cognitive functioning          Following Commands: Follows one step commands consistently Safety/Judgement: Decreased awareness of safety   Problem Solving: Requires verbal cues;Requires tactile cues General Comments: multimodal cues given at times    Extremity/Trunk Assessment               Exercises     Shoulder Instructions       General Comments      Pertinent Vitals/ Pain       Pain Assessment: No/denies pain  Home Living                                          Prior Functioning/Environment  Frequency Min 2X/week     Progress Toward Goals  OT Goals(current goals can now be found in the care plan section)  Progress towards OT goals: Progressing toward goals     Plan      Co-evaluation                 End of Session     Activity Tolerance Patient tolerated treatment well (with rest breaks)   Patient Left in bed;with call bell/phone within reach;with bed alarm set   Nurse Communication          Time: [phone] OT Time Calculation (min): 29 min  Charges: OT General Charges $OT Visit: 1 Procedure OT Evaluation $OT Eval Low Complexity: 1 Procedure $OT Eval Moderate Complexity: 1 Procedure OT Treatments $Self Care/Home Management : 23-37 mins  Izzy Courville 01/12/2016, 10:07 AM  Marica Otter, OTR/L [phone] 01/12/2016

## 2016-01-12 NOTE — Progress Notes (Signed)
Patient is discharged from room 5C06 at this time. Alert and in stable condition. IV site d/c'd as well as tele. Instructions read to patient and son with understanding verbalized. Left unit via wheelchair with all belongings at side.

## 2016-01-12 NOTE — Progress Notes (Signed)
  Echocardiogram 2D Echocardiogram has been performed.  [name] [name] 01/12/2016, 9:36 AM

## 2016-01-12 NOTE — Care Management Note (Signed)
Case Management Note  Patient Details  Name: Lecretia Buczek MRN: [id number] Date of Birth: [birth year]  Subjective/Objective:                    Action/Plan: Plan is for patient to discharge home with [facility] services. CM met with the patient and her sons and provided them a list of [place] agencies in the [place] area. They selected [place]. Manuela Schwartz with [facility] notified and accepted the referral. Rec for a 3 in 1. Pt states she already has this DME at home. Will update the bedside RN.   Expected Discharge Date:                  Expected Discharge Plan:  [place]  In-House Referral:     Discharge planning Services  CM Consult  Post Acute Care Choice:  Home Health Choice offered to:  Patient, Adult Children  DME Arranged:    DME Agency:     HH Arranged:  RN, PT, OT HH Agency:  [place]  Status of Service:  Completed, signed off  Medicare Important Message Given:  Yes Date Medicare IM Given:    Medicare IM give by:    Date Additional Medicare IM Given:    Additional Medicare Important Message give by:     If discussed at [place] of Stay Meetings, dates discussed:    Additional Comments:  Pollie Friar, RN 01/12/2016, 11:30 AM

## 2016-01-12 NOTE — Consult Note (Signed)
   [facility] CM Inpatient Consult   01/12/2016  [name] [name] 24-Apr-1934 [id number]  Patient screened for potential [facility] Care Management services. Patient is eligible for [facility]. Patient is a 82 y.o. female with history of diabetes mellitus, hypertension, hyperlipidemia, coronary artery disease, hypothyroidism and CHF presenting with recurrent episodes of dizziness and unsteady gait.  Electronic medical record reveals patient's discharge plan is for home with home health care and the inpatient RNCM has assigned this patient for stroke follow up with [facility]. There were no further identifiable [facility] care management needs at this time.  If patient's post hospital needs change please place a [facility] Care Management consult. For questions please contact:   Charlesetta Shanks, RN BSN CCM [facility]  [phone] business mobile phone Toll free office [phone]

## 2016-01-12 NOTE — Evaluation (Signed)
Speech Language Pathology Evaluation Patient Details Name: [name] [name] MRN: [id number] DOB: [birth year] Today's Date: 01/12/2016 Time: 4098-1191 SLP Time Calculation (min) (ACUTE ONLY): 21 min  Problem List:  Patient Active Problem List   Diagnosis Date Noted  . Hypertriglyceridemia, essential 01/10/2016  . Ataxia 01/10/2016  . 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 CVA (cerebral infarction) 01/10/2016  . Type 2 diabetes mellitus with diabetic neuropathy, with long-term current use of insulin (HCC) 10/06/2015  . Unspecified deficiency anemia 03/10/2014  . Osteoporosis, unspecified 03/10/2014  . Left bundle branch block 07/01/2013  . Coronary atherosclerosis of native coronary artery 02/16/2013  . Chronic systolic heart failure (HCC) 08/16/2012  . Congestive dilated cardiomyopathy (HCC) 08/02/2012  . HTN (hypertension) 08/02/2012  . Familial tremor 11/29/2010  . VITAMIN D DEFICIENCY 04/04/2010  . Osteoarthrosis, unspecified whether generalized or localized, unspecified site 04/04/2010  . Hyperlipidemia with target LDL less than 70 01/19/2007  . Hypothyroidism 01/14/2007   Past Medical History:  Past Medical History  Diagnosis Date  . Hypothyroidism   . High cholesterol   . Osteoarthritis   . Chronic combined systolic and diastolic CHF, NYHA class 3 (HCC)     a. 03/2013 Echo: EF 25-30%, diff HK, sev antsept HK, mild MR.  . Congestive dilated cardiomyopathy (HCC)     a. 03/2013 Echo: EF 25-30%;  b. 09/2013 s/p SJM 3242 CRT-P.  Marland Kitchen Left bundle branch block   . Type II diabetes mellitus (HCC)   . GERD (gastroesophageal reflux disease)   . Coronary artery disease     a. 08/2012 Cath: LM nl, LAD 60m, LCX nondom, mild-mod nonobs dzs mid, OM1 ok, OM2 90/90p, RCA 40, EF 25-30%-->Med Rx.  Marland Kitchen Hypertension    Past Surgical History:  Past Surgical History  Procedure Laterality Date  . Abdominal hysterectomy  1980  . Bmd  2006  .  Bilateral cateract surgery    . Bi-ventricular pacemaker insertion (crt-p)      a. 09/2013 s/p SJM 3242 CRT-P.  . Orif tibia plateau Right 12/04/2013    Procedure: OPEN REDUCTION INTERNAL FIXATION (ORIF) TIBIAL PLATEAU;  Surgeon: Verlee Rossetti, MD;  Location: [facility] ORS;  Service: Orthopedics;  Laterality: Right;  . Right heart catheterization N/A 09/08/2012    Procedure: RIGHT HEART CATH;  Surgeon: Dolores Patty, MD;  Location: [facility] CATH LAB;  Service: Cardiovascular;  Laterality: N/A;  . Bi-ventricular pacemaker insertion N/A 10/01/2013    Procedure: BI-VENTRICULAR PACEMAKER INSERTION (CRT-P);  Surgeon: Duke Salvia, MD;  Location: [facility] CATH [facility];  Service: Cardiovascular;  Laterality: N/A;   HPI:  Pt is a 82 y/o female who presents s/p unresponsive event at home. Upon workup in ED, CT revealed subacute R ischemic stroke. No MRI pending as pt has pacemaker.    Assessment / Plan / Recommendation Clinical Impression  Pt scored 11/22 on the MoCA-Blind, indicative of cognitive impairment. She appears to have difficulty with sustained attention, working memory, Electrical engineer of new information, and overall intellectual awareness. Max cues provided for recall of four words after 5 minutes. Sons report this is a change from her baseline. Recommend HH SLP and 24/7 supervision upon d/c to maximize cognition and safety.    SLP Assessment  Patient needs continued Speech Lanaguage Pathology Services    Follow Up Recommendations  Home health SLP;24 hour supervision/assistance    Frequency and Duration min 2x/week  2 weeks      SLP Evaluation Prior Functioning  Cognitive/Linguistic Baseline:  Within functional limits Type of Home: House  Lives With: Son Available Help at Discharge: Family;Available 24 hours/day (son works from home)   IT consultant  Overall Cognitive Status: Impaired/Different from baseline Arousal/Alertness: Awake/alert Orientation Level: Oriented X4 Attention: Sustained Sustained  Attention: Impaired Sustained Attention Impairment: Verbal basic Memory: Impaired Memory Impairment: Storage deficit;Retrieval deficit;Decreased recall of new information Awareness: Impaired Awareness Impairment: Intellectual impairment;Emergent impairment;Anticipatory impairment Problem Solving: Impaired Problem Solving Impairment: Verbal complex Safety/Judgment: Impaired    Comprehension  Auditory Comprehension Overall Auditory Comprehension: Impaired Commands: Impaired One Step Basic Commands: 75-100% accurate Complex Commands: 50-74% accurate Conversation: Simple    Expression Expression Primary Mode of Expression: Verbal Verbal Expression Overall Verbal Expression: Impaired Initiation: No impairment Naming: Impairment Divergent: Other (comment) (only 7 words in 1 minute) Interfering Components: Attention;Other (comment) (working memory)   Oral / Acupuncturist Function Overall Oral Motor/Sensory Function: Within functional limits Motor Speech Overall Motor Speech: Appears within functional limits for tasks assessed   GO                   Maxcine Ham, M.A. CCC-SLP [phone]  Maxcine Ham 01/12/2016, 11:39 AM

## 2016-01-12 NOTE — Discharge Summary (Signed)
Physician Discharge Summary  [name] [name] ZOX:[id number] DOB: Jun 16, 1934 DOA: 01/10/2016  PCP: Sanda Linger, MD  Admit date: 01/10/2016 Discharge date: 01/12/2016  Time spent: 35 minutes  Recommendations for Outpatient Follow-up:  Repeat BMET to follow electrolytes and renal function  Close follow up to CBG's and further adjustments to hypoglycemic regimen as needed Will need referral to neuro IR in 3-4 weeks for evaluation/treatment option on high grade stenosis of left ICA  Discharge Diagnoses:  Principal Problem:   CVA (cerebral infarction) Active Problems:   Hypothyroidism   Hyperlipidemia with target LDL less than 70   HTN (hypertension)   Chronic systolic heart failure (HCC)   Uncontrolled Type 2 diabetes mellitus with diabetic neuropathy, with long-term current use of insulin (HCC)   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 Discharge home with home health services. Follow up with PCP in 2 weeks and with neurology in 2 months.   Diet recommendation: heart healthy and low carb diet  Filed Weights   01/11/16 0900  Weight: 63.413 kg (139 lb 12.8 oz)    History of present illness:  As per Dr. Robb Matar H&P written on 01/10/16 82 y.o. female with medical history significant of hypothyroidism, hyperlipidemia, osteoarthritis, type 2 diabetes, CAD, left bundle branch block, chronic combined systolic and diastolic CHF, GERD who came to the emergency department due to recurrent dizziness episodes and unsteady gait for almost 2 weeks. She denies numbness or weakness, slurred speech or language articulation/comprehension difficulty. She describes this sensation as feeling drunk.  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 [facility]) Following neurology rec's will treat with ASA and plavix (given large vessels involved) Continue risk  factors modifications (BP, DM and cholesterol management) They will follow up patient in 2 months Per PT/OT rec's discharge home with [facility] services    Left ICA stenosis -With intracranial and extracranial component -per vascular surgery rec's will benefit of evaluation from neuro IR for further evaluation and treatment decisions  -high grade stenosis and asymptomatic currently; continue risk factors modifications     Hyperlipidemia with target LDL less than 70 -Continue statins (substitute to crestor and Co-Q10, for better control)   HTN (hypertension) -Continue carvedilol 12.5 mg by mouth twice a day. -Continue lisinopril 5 mg BID -Advise to follow low sodium diet   Chronic combined heart failure, systolic and diastolic (HCC) -Compensated and euvolemic. -On repeat echo during this admission EF up and 60-65 % -Continue carvedilol, lisinopril.  -At home will continue using PRN lasix to control volume status -Advise to check weight on daily basis and to follow low sodium diet    Hypothyroidism -Will continue synthroid    Type 2 diabetes mellitus with diabetic neuropathy, with long-term current use of insulin (HCC) -will need close follow up by PCP and endocrinologist for further adjustment on her hypoglycemic regimen  -Continue Lantus (which was increased to 18 units daily and will resume glipizide BID -advise to follow low carb diet and to follow with PCP  and SSI -A1C 11.5  Procedures:  See below for x-ray reports  CT head: right PCA territory infarcts in setting of R PCA occlusion  2-D echo:  Study Conclusions - Left ventricle: The cavity size was normal. There was mild  concentric hypertrophy. Systolic function was normal. The  estimated ejection fraction was in the range of 60% to 65%. Wall  motion was normal; there were no regional wall motion  abnormalities. There was an increased relative contribution of  atrial contraction to ventricular filling. Doppler  parameters are  consistent with abnormal left ventricular relaxation (grade 1  diastolic dysfunction). Doppler parameters are consistent with  high ventricular filling pressure. - Aortic valve: Moderate focal calcification, consistent with  sclerosis. - Mitral valve: Minimal focal calcification of the anterior leaflet  (medial segment(s)).   CT angio head/neck: R PCA territory infarcts. R P2 occlusion. R P1 high grade stenosis. L ICA string sign at origin. Hemodynamically significant stenosis: B ICA siphons, L P1 & P2, L VA origin.   Consultations:  Neurology  Vascular surgery   Discharge Exam: Filed Vitals:   01/12/16 0607 01/12/16 0740  BP: 126/48 144/63  Pulse: 64 66  Temp: 98.9 F (37.2 C) 98.2 F (36.8 C)  Resp: 18 18    General: still feeling weak and according to her lazy. Also with some nausea and right side HA. No vision changes, CP or SOB. Patient afebrile and denying nausea/vomiting.  Cardiovascular: S1 and S2, no rubs or gallops  Respiratory: CTA bilaterally and with good O2 sat on RA  Abdomen: soft, NT, ND, positive BS  Musculoskeletal: no edema, no cyanosis   Neuro: good MS bilaterally, grossly intact CN and EOMI. Patient denies dizziness and HA's today.  Discharge Instructions   Discharge Instructions    Diet - low sodium heart healthy    Complete by:  As directed      Discharge instructions    Complete by:  As directed   Follow up with neurology in 2 months Arrange follow up with PCP in 2 weeks Follow heart healthy diet and low carbohydrates diet Take medications as prescribed Please maintain yourself well hydrated          Current Discharge Medication List    START taking these medications   Details  aspirin 325 MG tablet Take 1 tablet (325 mg total) by mouth daily. Qty: 30 tablet, Refills: 1    clopidogrel (PLAVIX) 75 MG tablet Take 1 tablet (75 mg total) by mouth daily. Qty: 30 tablet, Refills: 2    co-enzyme Q-10 30 MG  capsule Take 1 capsule (30 mg total) by mouth daily. Qty: 30 capsule, Refills: 2    rosuvastatin (CRESTOR) 20 MG tablet Take 1 tablet (20 mg total) by mouth daily. Qty: 30 tablet, Refills: 2      CONTINUE these medications which have CHANGED   Details  Insulin Glargine (LANTUS SOLOSTAR) 100 UNIT/ML Solostar Pen Inject 18 Units into the skin every morning. Qty: 15 mL, Refills: 10      CONTINUE these medications which have NOT CHANGED   Details  carvedilol (COREG) 12.5 MG tablet TAKE 1 TABLET TWICE A DAY WITH MEALS Qty: 180 tablet, Refills: 5    furosemide (LASIX) 20 MG tablet Take 20 mg by mouth daily as needed for fluid.     glipiZIDE (GLUCOTROL) 10 MG tablet TAKE 1 TABLET TWICE A DAY BEFORE MEALS Qty: 180 tablet, Refills: 1   Associated Diagnoses: Type 2 diabetes mellitus with other diabetic neurological complication (HCC)    levothyroxine (SYNTHROID, LEVOTHROID) 137 MCG tablet Take 1 tablet (137 mcg total) by mouth daily before breakfast. Qty: 90 tablet, Refills: 1    lisinopril (PRINIVIL,ZESTRIL) 5 MG tablet Take 1 tablet (5 mg total) by mouth 2 (two) times daily. Qty: 180 tablet, Refills: 3   Associated Diagnoses: Chronic systolic heart failure (HCC)  omeprazole (PRILOSEC) 20 MG capsule Take 20 mg by mouth daily as needed (for acid reflux/heartburn.).     Vitamin D, Ergocalciferol, (DRISDOL) 50000 units CAPS capsule TAKE 1 CAPSULE EVERY 7 DAYS ON MONDAYS Qty: 12 capsule, Refills: 2    !! glucose blood (ACCU-CHEK AVIVA PLUS) test strip 1 each by Other route 2 (two) times daily. Use to check blood sugars twice a day Dx E11.9 Qty: 50 each, Refills: 0    !! glucose blood (ONETOUCH VERIO) test strip Use to test blood sugar once daily as instructed. Dx: E11.40 Qty: 100 each, Refills: 3    !! glucose blood test strip 1 each by Other route 2 (two) times daily. Dx E11.9 Qty: 50 each, Refills: 1    Insulin Pen Needle 32G X 4 MM MISC Use 1x a day Qty: 100 each, Refills: 1     ONETOUCH DELICA LANCETS FINE MISC Use to test blood sugar 1 time daily. Dx: E11.40 Qty: 100 each, Refills: 3     !! - Potential duplicate medications found. Please discuss with provider.    STOP taking these medications     aspirin EC 81 MG EC tablet      atorvastatin (LIPITOR) 10 MG tablet        Allergies  Allergen Reactions  . Statins Other (See Comments)    Leg cramping  . Metformin And Related     Pt stopped it due to diarrhea  . Vytorin [Ezetimibe-Simvastatin] Other (See Comments)    Leg cramps   Follow-up Information    Follow up with Sanda Linger, MD. Schedule an appointment as soon as possible for a visit in 2 weeks.   Specialty:  Internal Medicine   Contact information:   [address] [place] [address] [phone]       Follow up with SETHI,PRAMOD, MD In 2 months.   Specialties:  Neurology, Radiology   Contact information:   [address] [place] [address] [phone]       The results of significant diagnostics from this hospitalization (including imaging, microbiology, ancillary and laboratory) are listed below for reference.    Significant Diagnostic Studies: Ct Angio Head W/cm &/or Wo Cm  01/11/2016  CLINICAL DATA:  82 year old female with ataxia and right side headache for multiple days. Right PCA infarct on noncontrast head CT yesterday. Initial encounter. EXAM: CT ANGIOGRAPHY HEAD AND NECK TECHNIQUE: Multidetector CT imaging of the head and neck was performed using the standard protocol during bolus administration of intravenous contrast. Multiplanar CT image reconstructions and MIPs were obtained to evaluate the vascular anatomy. Carotid stenosis measurements (when applicable) are obtained utilizing NASCET criteria, using the distal internal carotid diameter as the denominator. CONTRAST:  50 mL Isovue 370 COMPARISON:  Noncontrast head CT 01/10/2016. FINDINGS: CTA NECK Skeleton: Multilevel degenerative changes in the  cervical spine. Partially visible thoracic levoconvex scoliosis. Absent dentition. No acute osseous abnormality identified. Minimal posterior right ethmoid sinus mucosal thickening. Other Visualized paranasal sinuses and mastoids are clear. Other neck: Left chest cardiac pacemaker type device. Negative lung apices. No superior mediastinal lymphadenopathy. Small volume of gas distending the upper thoracic esophagus, nonspecific. Negative thyroid, larynx (glottis is closed), pharynx, parapharyngeal spaces, retropharyngeal space, sublingual space, submandibular glands and parotid glands. No cervical lymphadenopathy. Aortic arch: 3 vessel arch configuration. Mild mostly soft arch atherosclerotic plaque. Right carotid system: Tortuous proximal right CCA. Calcified plaque at the posterior right ICA origin and bulb resulting in less than 50 % stenosis  with respect to the distal vessel. Mildly tortuous cervical right ICA. Left carotid system: Tortuous left CCA. Bulky calcified plaque at the left carotid bifurcation and left ICA origin resulting in radiographic string sign stenosis over a segment of 8-10 mm (series 403, image 117). The left ICA remains patent, and is mildly tortuous distal to the bulb. Vertebral arteries:No proximal right subclavian artery stenosis despite calcified plaque. Normal right vertebral artery origin. Tortuous right V2 segment. Otherwise negative right vertebral artery to the skullbase. 50% stenosis of the proximal left subclavian artery related the calcified plaque. Dense calcified plaque at the left vertebral artery origin resulting in severe stenosis (series 403, image 136). The left vertebral artery rib remains patent. The vertebral arteries are fairly codominant. The left V2 segment is tortuous. Otherwise negative left vertebral artery to the skullbase. CTA HEAD Posterior circulation: No distal right vertebral artery stenosis. Calcified plaque in the proximal left V4 segment which is not  significant. Normal left PICA and right AICA origins. Normal vertebrobasilar junction. No basilar stenosis. Normal SCA and PCA origins. However, there is high-grade stenosis in the right PCA P1 segment (series 405, image 22), and the right PCA occludes in the P2 segment best seen on series 403 images 93 through 95. Furthermore there are tandem moderate to severe stenoses in the left PCA P1 and P2 segments (series 406, image 31). Anterior circulation: Both ICA siphons are patent, but there is severe bilateral calcified atherosclerosis resulting an hemodynamically significant stenosis at the distal cavernous and proximal supraclinoid segments on the left as well as the proximal supraclinoid segment on the right. Right ophthalmic artery origin remains normal. Carotid termini remain patent. There is a mild stenosis at the left ACA origin. A1 segments, anterior communicating artery, and bilateral ACA branches then are within normal limits. Left MCA origin is normal. Left M1 segment, left MCA bifurcation, and left MCA branches are within normal limits. Right MCA origin, right M1 segment, bifurcation, and right MCA branches are within normal limits. Venous sinuses: Patent. Anatomic variants: None. Delayed phase: Cytotoxic edema in the right PCA territory involving the occipital lobe, the mesial temporal lobe, an the right hippocampal formation appears stable. No associated hemorrhage. No significant mass effect. Stable gray-white matter differentiation elsewhere. No abnormal enhancement identified. IMPRESSION: 1. Positive for emergent large vessel occlusion of the right PCA in the P2 segment. High-grade stenosis in the right P1 segment. 2. Other hemodynamically significant intracranial stenoses: - Bilateral ICA siphons, related to calcified plaque. - Left PCA P1 and P2 segments. 3. High-grade RADIOGRAPHIC STRING SIGN stenosis of the left ICA origin. 4. Severe stenosis of the left vertebral artery origin due to calcified  plaque. 5. Right carotid bifurcation atherosclerosis without significant stenosis. 50% stenosis proximal left subclavian artery. 6. Stable CT appearance of the confluent right PCA territory infarcts since yesterday. No associated hemorrhage or significant mass effect. No new intracranial abnormality identified. Study discussed by telephone with Stroke Neurology Provider Annie Main NP on 01/11/2016 at 11:03 . Electronically Signed   By: Odessa Fleming M.D.   On: 01/11/2016 11:04   Dg Chest 2 View  01/10/2016  CLINICAL DATA:  Dizziness and lethargy EXAM: CHEST  2 VIEW COMPARISON:  Jan 01, 2016 FINDINGS: The pacemaker is stable. Mild vascular crowding in the medial right lung base. The heart, hila, mediastinum, lungs, and pleura are otherwise stable. Anterior wedging of 2 lower thoracic vertebral bodies. Only 1 of these vertebral bodies was involved in February of 2015. IMPRESSION: Anterior wedging of  a lower thoracic vertebral body, new since February 2015 but otherwise age indeterminate. Recommend clinical correlation. No other acute abnormalities. Electronically Signed   By: Gerome Sam III M.D   On: 01/10/2016 22:15   Ct Head Wo Contrast  01/10/2016  CLINICAL DATA:  Right-sided headaches and ataxia for 3 weeks. Concern for stroke. EXAM: CT HEAD WITHOUT CONTRAST TECHNIQUE: Contiguous axial images were obtained from the base of the skull through the vertex without intravenous contrast. COMPARISON:  None. FINDINGS: There is cytotoxic edema in the medial right occipital lobe and posterior right temporal lobe consistent with a moderate-sized, likely subacute PCA infarct. There is no evidence of intracranial hemorrhage, midline shift, mass, or extra-axial fluid collection. There is mild global cerebral atrophy. Periventricular white-matter hypodensities are nonspecific but compatible with mild chronic small vessel ischemic disease. No acute abnormality identified in the imaged orbits. The visualized paranasal sinuses  and mastoid air cells are clear. Calcified atherosclerosis is noted at the skullbase. No acute osseous abnormality. IMPRESSION: 1. Moderate-sized subacute right PCA infarct. No evidence of hemorrhage. 2. Mild chronic small vessel ischemic disease and cerebral atrophy. These results will be called to the ordering clinician or representative by the Radiologist Assistant, and communication documented in the PACS or zVision Dashboard. Electronically Signed   By: Sebastian Ache M.D.   On: 01/10/2016 15:44   Ct Angio Neck W/cm &/or Wo/cm  01/11/2016  CLINICAL DATA:  82 year old female with ataxia and right side headache for multiple days. Right PCA infarct on noncontrast head CT yesterday. Initial encounter. EXAM: CT ANGIOGRAPHY HEAD AND NECK TECHNIQUE: Multidetector CT imaging of the head and neck was performed using the standard protocol during bolus administration of intravenous contrast. Multiplanar CT image reconstructions and MIPs were obtained to evaluate the vascular anatomy. Carotid stenosis measurements (when applicable) are obtained utilizing NASCET criteria, using the distal internal carotid diameter as the denominator. CONTRAST:  50 mL Isovue 370 COMPARISON:  Noncontrast head CT 01/10/2016. FINDINGS: CTA NECK Skeleton: Multilevel degenerative changes in the cervical spine. Partially visible thoracic levoconvex scoliosis. Absent dentition. No acute osseous abnormality identified. Minimal posterior right ethmoid sinus mucosal thickening. Other Visualized paranasal sinuses and mastoids are clear. Other neck: Left chest cardiac pacemaker type device. Negative lung apices. No superior mediastinal lymphadenopathy. Small volume of gas distending the upper thoracic esophagus, nonspecific. Negative thyroid, larynx (glottis is closed), pharynx, parapharyngeal spaces, retropharyngeal space, sublingual space, submandibular glands and parotid glands. No cervical lymphadenopathy. Aortic arch: 3 vessel arch configuration.  Mild mostly soft arch atherosclerotic plaque. Right carotid system: Tortuous proximal right CCA. Calcified plaque at the posterior right ICA origin and bulb resulting in less than 50 % stenosis with respect to the distal vessel. Mildly tortuous cervical right ICA. Left carotid system: Tortuous left CCA. Bulky calcified plaque at the left carotid bifurcation and left ICA origin resulting in radiographic string sign stenosis over a segment of 8-10 mm (series 403, image 117). The left ICA remains patent, and is mildly tortuous distal to the bulb. Vertebral arteries:No proximal right subclavian artery stenosis despite calcified plaque. Normal right vertebral artery origin. Tortuous right V2 segment. Otherwise negative right vertebral artery to the skullbase. 50% stenosis of the proximal left subclavian artery related the calcified plaque. Dense calcified plaque at the left vertebral artery origin resulting in severe stenosis (series 403, image 136). The left vertebral artery rib remains patent. The vertebral arteries are fairly codominant. The left V2 segment is tortuous. Otherwise negative left vertebral artery to the skullbase. CTA HEAD  Posterior circulation: No distal right vertebral artery stenosis. Calcified plaque in the proximal left V4 segment which is not significant. Normal left PICA and right AICA origins. Normal vertebrobasilar junction. No basilar stenosis. Normal SCA and PCA origins. However, there is high-grade stenosis in the right PCA P1 segment (series 405, image 22), and the right PCA occludes in the P2 segment best seen on series 403 images 93 through 95. Furthermore there are tandem moderate to severe stenoses in the left PCA P1 and P2 segments (series 406, image 31). Anterior circulation: Both ICA siphons are patent, but there is severe bilateral calcified atherosclerosis resulting an hemodynamically significant stenosis at the distal cavernous and proximal supraclinoid segments on the left as well  as the proximal supraclinoid segment on the right. Right ophthalmic artery origin remains normal. Carotid termini remain patent. There is a mild stenosis at the left ACA origin. A1 segments, anterior communicating artery, and bilateral ACA branches then are within normal limits. Left MCA origin is normal. Left M1 segment, left MCA bifurcation, and left MCA branches are within normal limits. Right MCA origin, right M1 segment, bifurcation, and right MCA branches are within normal limits. Venous sinuses: Patent. Anatomic variants: None. Delayed phase: Cytotoxic edema in the right PCA territory involving the occipital lobe, the mesial temporal lobe, an the right hippocampal formation appears stable. No associated hemorrhage. No significant mass effect. Stable gray-white matter differentiation elsewhere. No abnormal enhancement identified. IMPRESSION: 1. Positive for emergent large vessel occlusion of the right PCA in the P2 segment. High-grade stenosis in the right P1 segment. 2. Other hemodynamically significant intracranial stenoses: - Bilateral ICA siphons, related to calcified plaque. - Left PCA P1 and P2 segments. 3. High-grade RADIOGRAPHIC STRING SIGN stenosis of the left ICA origin. 4. Severe stenosis of the left vertebral artery origin due to calcified plaque. 5. Right carotid bifurcation atherosclerosis without significant stenosis. 50% stenosis proximal left subclavian artery. 6. Stable CT appearance of the confluent right PCA territory infarcts since yesterday. No associated hemorrhage or significant mass effect. No new intracranial abnormality identified. Study discussed by telephone with Stroke Neurology Provider Annie Main NP on 01/11/2016 at 11:03 . Electronically Signed   By: Odessa Fleming M.D.   On: 01/11/2016 11:04   Dg Chest Portable 1 View  01/01/2016  CLINICAL DATA:  Acute onset of dizziness and lightheadedness. Hyperglycemia. Initial encounter. EXAM: PORTABLE CHEST 1 VIEW COMPARISON:  Chest radiograph  performed 12/03/2013 FINDINGS: The lungs are well-aerated and clear. There is no evidence of focal opacification, pleural effusion or pneumothorax. The cardiomediastinal silhouette is borderline normal in size. A pacemaker is noted overlying the left chest wall, with leads ending overlying the right atrium, right ventricle and coronary sinus. No acute osseous abnormalities are seen. IMPRESSION: No acute cardiopulmonary process seen. Electronically Signed   By: Roanna Raider M.D.   On: 01/01/2016 01:38   Labs: Basic Metabolic Panel:  Recent Labs Lab 01/10/16 1416 01/10/16 1644 01/10/16 2300 01/11/16 0503  NA 138 138  --  139  K 3.9 3.8  --  3.1*  CL 103 105  --  106  CO2 26 25  --  24  GLUCOSE 167* 133*  --  104*  BUN 11 10  --  9  CREATININE 1.07 1.00 0.94 0.92  CALCIUM 9.8 9.6  --  9.4  MG  --   --   --  1.5*  PHOS  --   --   --  3.4   Liver Function Tests:  Recent Labs Lab 01/10/16 1644  AST 22  ALT 14  ALKPHOS 59  BILITOT 0.6  PROT 7.4  ALBUMIN 3.8   CBC:  Recent Labs Lab 01/10/16 1644 01/10/16 2300  WBC 6.6 6.7  NEUTROABS 3.0  --   HGB 12.7 11.9*  HCT 39.3 36.7  MCV 81.7 81.7  PLT 292 256   CBG:  Recent Labs Lab 01/11/16 0739 01/11/16 1132 01/11/16 1654 01/11/16 2202 01/12/16 0641  GLUCAP 119* 217* 68 120* 132*    Signed:  Vassie Loll MD.  [facility] 01/12/2016, 11:30 AM

## 2016-01-12 NOTE — Progress Notes (Signed)
STROKE TEAM PROGRESS NOTE   SUBJECTIVE (INTERVAL HISTORY) Patient remains stable without any changes.vascular surgery consult appreciated. Dr Imogene Burn prefers no intervention at present and outpatient f/u.   OBJECTIVE Temp:  [98 F (36.7 C)-98.9 F (37.2 C)] 98.2 F (36.8 C) (06/02 0740) Pulse Rate:  [61-72] 66 (06/02 0740) Cardiac Rhythm:  [-] Normal sinus rhythm;Other (Comment) (06/02 0713) Resp:  [16-18] 18 (06/02 0740) BP: (114-144)/(48-63) 144/63 mmHg (06/02 0740) SpO2:  [95 %-100 %] 98 % (06/02 0740)  CBC:   Recent Labs Lab 01/10/16 1644 01/10/16 2300  WBC 6.6 6.7  NEUTROABS 3.0  --   HGB 12.7 11.9*  HCT 39.3 36.7  MCV 81.7 81.7  PLT 292 256    Basic Metabolic Panel:   Recent Labs Lab 01/10/16 1644 01/10/16 2300 01/11/16 0503  NA 138  --  139  K 3.8  --  3.1*  CL 105  --  106  CO2 25  --  24  GLUCOSE 133*  --  104*  BUN 10  --  9  CREATININE 1.00 0.94 0.92  CALCIUM 9.6  --  9.4  MG  --   --  1.5*  PHOS  --   --  3.4    Lipid Panel:     Component Value Date/Time   CHOL 218* 01/11/2016 0503   TRIG 178* 01/11/2016 0503   HDL 30* 01/11/2016 0503   CHOLHDL 7.3 01/11/2016 0503   VLDL 36 01/11/2016 0503   LDLCALC 152* 01/11/2016 0503   HgbA1c:  Lab Results  Component Value Date   HGBA1C 11.4* 01/11/2016   Urine Drug Screen: No results found for: LABOPIA, COCAINSCRNUR, LABBENZ, AMPHETMU, THCU, LABBARB    IMAGING  Ct Angio Head W/cm &/or Wo Cm  01/11/2016  CLINICAL DATA:  82 year old female with ataxia and right side headache for multiple days. Right PCA infarct on noncontrast head CT yesterday. Initial encounter. EXAM: CT ANGIOGRAPHY HEAD AND NECK TECHNIQUE: Multidetector CT imaging of the head and neck was performed using the standard protocol during bolus administration of intravenous contrast. Multiplanar CT image reconstructions and MIPs were obtained to evaluate the vascular anatomy. Carotid stenosis measurements (when applicable) are obtained  utilizing NASCET criteria, using the distal internal carotid diameter as the denominator. CONTRAST:  50 mL Isovue 370 COMPARISON:  Noncontrast head CT 01/10/2016. FINDINGS: CTA NECK Skeleton: Multilevel degenerative changes in the cervical spine. Partially visible thoracic levoconvex scoliosis. Absent dentition. No acute osseous abnormality identified. Minimal posterior right ethmoid sinus mucosal thickening. Other Visualized paranasal sinuses and mastoids are clear. Other neck: Left chest cardiac pacemaker type device. Negative lung apices. No superior mediastinal lymphadenopathy. Small volume of gas distending the upper thoracic esophagus, nonspecific. Negative thyroid, larynx (glottis is closed), pharynx, parapharyngeal spaces, retropharyngeal space, sublingual space, submandibular glands and parotid glands. No cervical lymphadenopathy. Aortic arch: 3 vessel arch configuration. Mild mostly soft arch atherosclerotic plaque. Right carotid system: Tortuous proximal right CCA. Calcified plaque at the posterior right ICA origin and bulb resulting in less than 50 % stenosis with respect to the distal vessel. Mildly tortuous cervical right ICA. Left carotid system: Tortuous left CCA. Bulky calcified plaque at the left carotid bifurcation and left ICA origin resulting in radiographic string sign stenosis over a segment of 8-10 mm (series 403, image 117). The left ICA remains patent, and is mildly tortuous distal to the bulb. Vertebral arteries:No proximal right subclavian artery stenosis despite calcified plaque. Normal right vertebral artery origin. Tortuous right V2 segment. Otherwise negative right vertebral  artery to the skullbase. 50% stenosis of the proximal left subclavian artery related the calcified plaque. Dense calcified plaque at the left vertebral artery origin resulting in severe stenosis (series 403, image 136). The left vertebral artery rib remains patent. The vertebral arteries are fairly codominant. The  left V2 segment is tortuous. Otherwise negative left vertebral artery to the skullbase. CTA HEAD Posterior circulation: No distal right vertebral artery stenosis. Calcified plaque in the proximal left V4 segment which is not significant. Normal left PICA and right AICA origins. Normal vertebrobasilar junction. No basilar stenosis. Normal SCA and PCA origins. However, there is high-grade stenosis in the right PCA P1 segment (series 405, image 22), and the right PCA occludes in the P2 segment best seen on series 403 images 93 through 95. Furthermore there are tandem moderate to severe stenoses in the left PCA P1 and P2 segments (series 406, image 31). Anterior circulation: Both ICA siphons are patent, but there is severe bilateral calcified atherosclerosis resulting an hemodynamically significant stenosis at the distal cavernous and proximal supraclinoid segments on the left as well as the proximal supraclinoid segment on the right. Right ophthalmic artery origin remains normal. Carotid termini remain patent. There is a mild stenosis at the left ACA origin. A1 segments, anterior communicating artery, and bilateral ACA branches then are within normal limits. Left MCA origin is normal. Left M1 segment, left MCA bifurcation, and left MCA branches are within normal limits. Right MCA origin, right M1 segment, bifurcation, and right MCA branches are within normal limits. Venous sinuses: Patent. Anatomic variants: None. Delayed phase: Cytotoxic edema in the right PCA territory involving the occipital lobe, the mesial temporal lobe, an the right hippocampal formation appears stable. No associated hemorrhage. No significant mass effect. Stable gray-white matter differentiation elsewhere. No abnormal enhancement identified. IMPRESSION: 1. Positive for emergent large vessel occlusion of the right PCA in the P2 segment. High-grade stenosis in the right P1 segment. 2. Other hemodynamically significant intracranial stenoses: -  Bilateral ICA siphons, related to calcified plaque. - Left PCA P1 and P2 segments. 3. High-grade RADIOGRAPHIC STRING SIGN stenosis of the left ICA origin. 4. Severe stenosis of the left vertebral artery origin due to calcified plaque. 5. Right carotid bifurcation atherosclerosis without significant stenosis. 50% stenosis proximal left subclavian artery. 6. Stable CT appearance of the confluent right PCA territory infarcts since yesterday. No associated hemorrhage or significant mass effect. No new intracranial abnormality identified. Study discussed by telephone with Stroke Neurology Provider Annie Main NP on 01/11/2016 at 11:03 . Electronically Signed   By: Odessa Fleming M.D.   On: 01/11/2016 11:04   Dg Chest 2 View  01/10/2016  CLINICAL DATA:  Dizziness and lethargy EXAM: CHEST  2 VIEW COMPARISON:  Jan 01, 2016 FINDINGS: The pacemaker is stable. Mild vascular crowding in the medial right lung base. The heart, hila, mediastinum, lungs, and pleura are otherwise stable. Anterior wedging of 2 lower thoracic vertebral bodies. Only 1 of these vertebral bodies was involved in February of 2015. IMPRESSION: Anterior wedging of a lower thoracic vertebral body, new since February 2015 but otherwise age indeterminate. Recommend clinical correlation. No other acute abnormalities. Electronically Signed   By: Gerome Sam III M.D   On: 01/10/2016 22:15   Ct Head Wo Contrast  01/10/2016  CLINICAL DATA:  Right-sided headaches and ataxia for 3 weeks. Concern for stroke. EXAM: CT HEAD WITHOUT CONTRAST TECHNIQUE: Contiguous axial images were obtained from the base of the skull through the vertex without intravenous contrast. COMPARISON:  None. FINDINGS: There is cytotoxic edema in the medial right occipital lobe and posterior right temporal lobe consistent with a moderate-sized, likely subacute PCA infarct. There is no evidence of intracranial hemorrhage, midline shift, mass, or extra-axial fluid collection. There is mild global  cerebral atrophy. Periventricular white-matter hypodensities are nonspecific but compatible with mild chronic small vessel ischemic disease. No acute abnormality identified in the imaged orbits. The visualized paranasal sinuses and mastoid air cells are clear. Calcified atherosclerosis is noted at the skullbase. No acute osseous abnormality. IMPRESSION: 1. Moderate-sized subacute right PCA infarct. No evidence of hemorrhage. 2. Mild chronic small vessel ischemic disease and cerebral atrophy. These results will be called to the ordering clinician or representative by the Radiologist Assistant, and communication documented in the PACS or zVision Dashboard. Electronically Signed   By: Sebastian Ache M.D.   On: 01/10/2016 15:44   Ct Angio Neck W/cm &/or Wo/cm  01/11/2016  CLINICAL DATA:  82 year old female with ataxia and right side headache for multiple days. Right PCA infarct on noncontrast head CT yesterday. Initial encounter. EXAM: CT ANGIOGRAPHY HEAD AND NECK TECHNIQUE: Multidetector CT imaging of the head and neck was performed using the standard protocol during bolus administration of intravenous contrast. Multiplanar CT image reconstructions and MIPs were obtained to evaluate the vascular anatomy. Carotid stenosis measurements (when applicable) are obtained utilizing NASCET criteria, using the distal internal carotid diameter as the denominator. CONTRAST:  50 mL Isovue 370 COMPARISON:  Noncontrast head CT 01/10/2016. FINDINGS: CTA NECK Skeleton: Multilevel degenerative changes in the cervical spine. Partially visible thoracic levoconvex scoliosis. Absent dentition. No acute osseous abnormality identified. Minimal posterior right ethmoid sinus mucosal thickening. Other Visualized paranasal sinuses and mastoids are clear. Other neck: Left chest cardiac pacemaker type device. Negative lung apices. No superior mediastinal lymphadenopathy. Small volume of gas distending the upper thoracic esophagus, nonspecific.  Negative thyroid, larynx (glottis is closed), pharynx, parapharyngeal spaces, retropharyngeal space, sublingual space, submandibular glands and parotid glands. No cervical lymphadenopathy. Aortic arch: 3 vessel arch configuration. Mild mostly soft arch atherosclerotic plaque. Right carotid system: Tortuous proximal right CCA. Calcified plaque at the posterior right ICA origin and bulb resulting in less than 50 % stenosis with respect to the distal vessel. Mildly tortuous cervical right ICA. Left carotid system: Tortuous left CCA. Bulky calcified plaque at the left carotid bifurcation and left ICA origin resulting in radiographic string sign stenosis over a segment of 8-10 mm (series 403, image 117). The left ICA remains patent, and is mildly tortuous distal to the bulb. Vertebral arteries:No proximal right subclavian artery stenosis despite calcified plaque. Normal right vertebral artery origin. Tortuous right V2 segment. Otherwise negative right vertebral artery to the skullbase. 50% stenosis of the proximal left subclavian artery related the calcified plaque. Dense calcified plaque at the left vertebral artery origin resulting in severe stenosis (series 403, image 136). The left vertebral artery rib remains patent. The vertebral arteries are fairly codominant. The left V2 segment is tortuous. Otherwise negative left vertebral artery to the skullbase. CTA HEAD Posterior circulation: No distal right vertebral artery stenosis. Calcified plaque in the proximal left V4 segment which is not significant. Normal left PICA and right AICA origins. Normal vertebrobasilar junction. No basilar stenosis. Normal SCA and PCA origins. However, there is high-grade stenosis in the right PCA P1 segment (series 405, image 22), and the right PCA occludes in the P2 segment best seen on series 403 images 93 through 95. Furthermore there are tandem moderate to severe stenoses in the left  PCA P1 and P2 segments (series 406, image 31).  Anterior circulation: Both ICA siphons are patent, but there is severe bilateral calcified atherosclerosis resulting an hemodynamically significant stenosis at the distal cavernous and proximal supraclinoid segments on the left as well as the proximal supraclinoid segment on the right. Right ophthalmic artery origin remains normal. Carotid termini remain patent. There is a mild stenosis at the left ACA origin. A1 segments, anterior communicating artery, and bilateral ACA branches then are within normal limits. Left MCA origin is normal. Left M1 segment, left MCA bifurcation, and left MCA branches are within normal limits. Right MCA origin, right M1 segment, bifurcation, and right MCA branches are within normal limits. Venous sinuses: Patent. Anatomic variants: None. Delayed phase: Cytotoxic edema in the right PCA territory involving the occipital lobe, the mesial temporal lobe, an the right hippocampal formation appears stable. No associated hemorrhage. No significant mass effect. Stable gray-white matter differentiation elsewhere. No abnormal enhancement identified. IMPRESSION: 1. Positive for emergent large vessel occlusion of the right PCA in the P2 segment. High-grade stenosis in the right P1 segment. 2. Other hemodynamically significant intracranial stenoses: - Bilateral ICA siphons, related to calcified plaque. - Left PCA P1 and P2 segments. 3. High-grade RADIOGRAPHIC STRING SIGN stenosis of the left ICA origin. 4. Severe stenosis of the left vertebral artery origin due to calcified plaque. 5. Right carotid bifurcation atherosclerosis without significant stenosis. 50% stenosis proximal left subclavian artery. 6. Stable CT appearance of the confluent right PCA territory infarcts since yesterday. No associated hemorrhage or significant mass effect. No new intracranial abnormality identified. Study discussed by telephone with Stroke Neurology Provider Annie Main NP on 01/11/2016 at 11:03 . Electronically Signed    By: Odessa Fleming M.D.   On: 01/11/2016 11:04   2D Echocardiogram  - Left ventricle: The cavity size was normal. There was mild concentric hypertrophy. Systolic function was normal. The estimated ejection fraction was in the range of 60% to 65%. Wall motion was normal; there were no regional wall motion abnormalities. There was an increased relative contribution of atrial contraction to ventricular filling. Doppler parameters are consistent with abnormal left ventricular relaxation (grade 1 diastolic dysfunction). Doppler parameters are consistent with high ventricular filling pressure. - Aortic valve: Moderate focal calcification, consistent with sclerosis. - Mitral valve: Minimal focal calcification of the anterior leaflet (medial segment(s)).   PHYSICAL EXAM Pleasant elderly lady not in distress. . Afebrile. Head is nontraumatic. Neck is supple without bruit.    Cardiac exam no murmur or gallop. Lungs are clear to auscultation. Distal pulses are well felt. Neurological Exam :  Awake alert oriented 3. Diminished attention and recall. No dysarthria or aphasia. Pupils equal reactive. Fundi were not visualized. Vision acuity seems adequate. Dense left homonymous hemianopsia. Face is symmetric without weakness. Tongue is midline. Motor system exam reveals no upper or lower extremity drift. No focal weakness. Mild tremor of outstretched right upper extremity. Deep tendon reflexes are symmetric. Plantars are downgoing. Sensation is intact. Coordination is accurate. Gait was not tested   ASSESSMENT/PLAN Ms. [name] [name] is a 82 y.o. female with history of diabetes mellitus, hypertension, hyperlipidemia, coronary artery disease, hypothyroidism and CHF presenting with recurrent episodes of dizziness and unsteady gait for 2 weeks. She did not receive IV t-PA due to delay in arrival.   Stroke:  right PCA territory infarcts in setting of R PCA occlusion, infarct thrombotic secondary to large vessel disease     Resultant  left homonymous hemianopsia  CTA H&N  R PCA territory infarcts. R P2 occlusion. R P1 high grade stenosis. L ICA string sign at origin. Hemodynamically significant stenosis: B ICA siphons, L P1 & P2, L VA origin.   2D Echo  EF 60-65%. No source of embolus   LDL 152  HgbA1c 11.5  Lovenox 40 mg sq daily for VTE prophylaxis Diet heart healthy/carb modified Room service appropriate?: Yes; Fluid consistency:: Thin Diet - low sodium heart healthy  aspirin 81 mg daily prior to admission, now on clopidogrel 75 mg daily  Patient counseled to be compliant with her antithrombotic medications  Ongoing aggressive stroke risk factor management  Therapy recommendations:  HH PT, OT, 3N1   Disposition:  Return home  Carotid stenosis  L ICA w/ string sign per CTA  Incidental finding  Consider intervention in 4 weeks  Medical management for now  Follow up Dr. Imogene Burn  Hypertension   Stable Permissive hypertension (OK if < 220/120) but gradually normalize in 5-7 days  Hyperlipidemia  Home meds:  lipitor 10  Recommend resumption in hospital (note hx leg problems on statins)  LDL 152, goal < 70  Change to crestor 10 mg  Continue statin at discharge  Diabetes type II  HgbA1c 11.5, goal < 7.0  Uncontrolled  Other Stroke Risk Factors  Advanced age  Former Cigarette smoker  Coronary artery disease  Chronic combined systolic and diastolic CHF, NYHA class 3 (HCC), known EF 25-30%  Other Active Problems  Hypothyroidism  Hospital day # 2 I have personally examined this patient, reviewed notes, independently viewed imaging studies, participated in medical decision making and plan of care. I have made any additions or clarifications directly to the above note. Agree with  Dr. Imogene Burn that patient needs maximal medical therapy now with aspirin and Plavix with strict control of lipids with LDL cholesterol goal below 70 mg percent, diabetes with hemoglobin A1c goal  below 7 and hypertension with blood pressure goal below 130/90. Follow-up as an outpatient in stroke clinic. Consider outpatient evaluation by interventional neuroradiologist left ICA extracranial and intracranial revascularization. Stroke team will sign off. Kindly call for questions.  Delia Heady, MD Medical Director [facility] Pager: [phone] 01/12/2016 1:55 PM     To contact Stroke Continuity provider, please refer to [email or web address]. After hours, contact General Neurology

## 2016-01-12 NOTE — Care Management Important Message (Signed)
Important Message  Patient Details  Name: [name] [name] MRN: [id number] Date of Birth: 1933/11/01   Medicare Important Message Given:  Yes    Bernadette Hoit 01/12/2016, 9:41 AM

## 2016-01-13 ENCOUNTER — Other Ambulatory Visit (HOSPITAL_COMMUNITY): Payer: Self-pay | Admitting: Internal Medicine

## 2016-01-13 DIAGNOSIS — I2510 Atherosclerotic heart disease of native coronary artery without angina pectoris: Secondary | ICD-10-CM | POA: Diagnosis not present

## 2016-01-13 DIAGNOSIS — E785 Hyperlipidemia, unspecified: Secondary | ICD-10-CM | POA: Diagnosis not present

## 2016-01-13 DIAGNOSIS — E1140 Type 2 diabetes mellitus with diabetic neuropathy, unspecified: Secondary | ICD-10-CM | POA: Diagnosis not present

## 2016-01-13 DIAGNOSIS — I69334 Monoplegia of upper limb following cerebral infarction affecting left non-dominant side: Secondary | ICD-10-CM | POA: Diagnosis not present

## 2016-01-13 DIAGNOSIS — M150 Primary generalized (osteo)arthritis: Secondary | ICD-10-CM | POA: Diagnosis not present

## 2016-01-13 DIAGNOSIS — I5042 Chronic combined systolic (congestive) and diastolic (congestive) heart failure: Secondary | ICD-10-CM | POA: Diagnosis not present

## 2016-01-13 DIAGNOSIS — K219 Gastro-esophageal reflux disease without esophagitis: Secondary | ICD-10-CM | POA: Diagnosis not present

## 2016-01-13 DIAGNOSIS — Z7984 Long term (current) use of oral hypoglycemic drugs: Secondary | ICD-10-CM | POA: Diagnosis not present

## 2016-01-13 DIAGNOSIS — E039 Hypothyroidism, unspecified: Secondary | ICD-10-CM | POA: Diagnosis not present

## 2016-01-13 DIAGNOSIS — I69311 Memory deficit following cerebral infarction: Secondary | ICD-10-CM | POA: Diagnosis not present

## 2016-01-13 DIAGNOSIS — Z87891 Personal history of nicotine dependence: Secondary | ICD-10-CM | POA: Diagnosis not present

## 2016-01-15 ENCOUNTER — Encounter (HOSPITAL_COMMUNITY): Payer: Self-pay | Admitting: Emergency Medicine

## 2016-01-15 ENCOUNTER — Emergency Department (HOSPITAL_COMMUNITY): Payer: Medicare Other

## 2016-01-15 ENCOUNTER — Emergency Department (HOSPITAL_COMMUNITY)
Admission: EM | Admit: 2016-01-15 | Discharge: 2016-01-16 | Disposition: A | Discharge status: Home / Self Care | Payer: Medicare Other | Attending: Emergency Medicine | Admitting: Emergency Medicine

## 2016-01-15 ENCOUNTER — Telehealth: Payer: Self-pay | Admitting: Internal Medicine

## 2016-01-15 ENCOUNTER — Other Ambulatory Visit: Payer: Self-pay | Admitting: *Deleted

## 2016-01-15 ENCOUNTER — Telehealth: Payer: Self-pay | Admitting: *Deleted

## 2016-01-15 DIAGNOSIS — I5042 Chronic combined systolic (congestive) and diastolic (congestive) heart failure: Secondary | ICD-10-CM | POA: Insufficient documentation

## 2016-01-15 DIAGNOSIS — I2510 Atherosclerotic heart disease of native coronary artery without angina pectoris: Secondary | ICD-10-CM | POA: Insufficient documentation

## 2016-01-15 DIAGNOSIS — E7800 Pure hypercholesterolemia, unspecified: Secondary | ICD-10-CM | POA: Diagnosis not present

## 2016-01-15 DIAGNOSIS — E119 Type 2 diabetes mellitus without complications: Secondary | ICD-10-CM | POA: Diagnosis not present

## 2016-01-15 DIAGNOSIS — R51 Headache: Secondary | ICD-10-CM | POA: Insufficient documentation

## 2016-01-15 DIAGNOSIS — I110 Hypertensive heart disease with heart failure: Secondary | ICD-10-CM | POA: Insufficient documentation

## 2016-01-15 DIAGNOSIS — E1140 Type 2 diabetes mellitus with diabetic neuropathy, unspecified: Secondary | ICD-10-CM | POA: Diagnosis not present

## 2016-01-15 DIAGNOSIS — Z7902 Long term (current) use of antithrombotics/antiplatelets: Secondary | ICD-10-CM | POA: Insufficient documentation

## 2016-01-15 DIAGNOSIS — E039 Hypothyroidism, unspecified: Secondary | ICD-10-CM | POA: Diagnosis not present

## 2016-01-15 DIAGNOSIS — M1990 Unspecified osteoarthritis, unspecified site: Secondary | ICD-10-CM | POA: Insufficient documentation

## 2016-01-15 DIAGNOSIS — R519 Headache, unspecified: Secondary | ICD-10-CM

## 2016-01-15 DIAGNOSIS — Z87891 Personal history of nicotine dependence: Secondary | ICD-10-CM | POA: Diagnosis not present

## 2016-01-15 DIAGNOSIS — I639 Cerebral infarction, unspecified: Secondary | ICD-10-CM | POA: Diagnosis not present

## 2016-01-15 DIAGNOSIS — Z7984 Long term (current) use of oral hypoglycemic drugs: Secondary | ICD-10-CM | POA: Diagnosis not present

## 2016-01-15 DIAGNOSIS — Z794 Long term (current) use of insulin: Secondary | ICD-10-CM | POA: Insufficient documentation

## 2016-01-15 DIAGNOSIS — I69311 Memory deficit following cerebral infarction: Secondary | ICD-10-CM | POA: Diagnosis not present

## 2016-01-15 DIAGNOSIS — Z950 Presence of cardiac pacemaker: Secondary | ICD-10-CM | POA: Diagnosis not present

## 2016-01-15 DIAGNOSIS — I5022 Chronic systolic (congestive) heart failure: Secondary | ICD-10-CM

## 2016-01-15 DIAGNOSIS — Z7982 Long term (current) use of aspirin: Secondary | ICD-10-CM | POA: Insufficient documentation

## 2016-01-15 DIAGNOSIS — M150 Primary generalized (osteo)arthritis: Secondary | ICD-10-CM | POA: Diagnosis not present

## 2016-01-15 DIAGNOSIS — I69334 Monoplegia of upper limb following cerebral infarction affecting left non-dominant side: Secondary | ICD-10-CM | POA: Diagnosis not present

## 2016-01-15 MED ORDER — LISINOPRIL 5 MG PO TABS: 5.0000 mg | ORAL_TABLET | Freq: Two times a day (BID) | ORAL | Status: DC

## 2016-01-15 MED ORDER — VITAMIN D (ERGOCALCIFEROL) 1.25 MG (50000 UNIT) PO CAPS: ORAL_CAPSULE | ORAL | Status: DC

## 2016-01-15 NOTE — ED Provider Notes (Addendum)
CSN: [id number]     Arrival date & time 01/15/16  2231 History  By signing my name below, I, Terrance Branch, attest that this documentation has been prepared under the direction and in the presence of Paula Libra, MD. Electronically Signed: Evon Slack, ED Scribe. 01/15/2016. 11:29 PM.    Chief Complaint  Patient presents with  . Headache   Patient is a 82 y.o. female presenting with headaches. The history is provided by the patient. No language interpreter was used.  Headache  HPI Comments: [name] [name] is a 82 y.o. female who Was admitted for a stroke on May 31. She now presents to the Emergency Department complaining of severe throbbing right temporal HA onset 3 days prior. Pt reports that HA is worse tonight. Pt states that 2 night prior she took tylenol with relief of the HA. Pt states that she has tried tylenol with no relief tonight. She denies any new deficits. She denies nausea or vomiting.    Past Medical History  Diagnosis Date  . Hypothyroidism   . High cholesterol   . Osteoarthritis   . Chronic combined systolic and diastolic CHF, NYHA class 3 (HCC)     a. 03/2013 Echo: EF 25-30%, diff HK, sev antsept HK, mild MR.  . Congestive dilated cardiomyopathy (HCC)     a. 03/2013 Echo: EF 25-30%;  b. 09/2013 s/p SJM 3242 CRT-P.  Marland Kitchen Left bundle branch block   . Type II diabetes mellitus (HCC)   . GERD (gastroesophageal reflux disease)   . Coronary artery disease     a. 08/2012 Cath: LM nl, LAD 52m, LCX nondom, mild-mod nonobs dzs mid, OM1 ok, OM2 90/90p, RCA 40, EF 25-30%-->Med Rx.  Marland Kitchen Hypertension    Past Surgical History  Procedure Laterality Date  . Abdominal hysterectomy  1980  . Bmd  2006  . Bilateral cateract surgery    . Bi-ventricular pacemaker insertion (crt-p)      a. 09/2013 s/p SJM 3242 CRT-P.  . Orif tibia plateau Right 12/04/2013    Procedure: OPEN REDUCTION INTERNAL FIXATION (ORIF) TIBIAL PLATEAU;  Surgeon: Verlee Rossetti, MD;  Location: [facility] ORS;  Service:  Orthopedics;  Laterality: Right;  . Right heart catheterization N/A 09/08/2012    Procedure: RIGHT HEART CATH;  Surgeon: Dolores Patty, MD;  Location: [facility] CATH LAB;  Service: Cardiovascular;  Laterality: N/A;  . Bi-ventricular pacemaker insertion N/A 10/01/2013    Procedure: BI-VENTRICULAR PACEMAKER INSERTION (CRT-P);  Surgeon: Duke Salvia, MD;  Location: [facility] CATH LAB;  Service: Cardiovascular;  Laterality: N/A;   Family History  Problem Relation Age of Onset  . Diabetes Other   . Hypertension Other   . Uterine cancer Other   . Diabetes Mother   . Heart attack Brother    Social History  Substance Use Topics  . Smoking status: Former Games developer  . Smokeless tobacco: Never Used  . Alcohol Use: No   OB History    No data available     Review of Systems  Neurological: Positive for headaches.   A complete 10 system review of systems was obtained and all systems are negative except as noted in the HPI and PMH.    Allergies  Statins; Metformin and related; and Vytorin  Home Medications   Prior to Admission medications   Medication Sig Start Date End Date Taking? Authorizing Provider  aspirin 325 MG tablet Take 1 tablet (325 mg total) by mouth daily. 01/12/16  Yes Vassie Loll, MD  carvedilol (COREG) 12.5  MG tablet TAKE 1 TABLET TWICE A DAY WITH MEALS   Yes Aundria Rud, NP  clopidogrel (PLAVIX) 75 MG tablet Take 1 tablet (75 mg total) by mouth daily. 01/12/16  Yes Vassie Loll, MD  co-enzyme Q-10 30 MG capsule Take 1 capsule (30 mg total) by mouth daily. 01/12/16  Yes Vassie Loll, MD  furosemide (LASIX) 20 MG tablet Take 20 mg by mouth daily as needed for fluid.    Yes Historical Provider, MD  glipiZIDE (GLUCOTROL) 10 MG tablet TAKE 1 TABLET TWICE A DAY BEFORE MEALS 10/06/15  Yes Carlus Pavlov, MD  glucose blood (ACCU-CHEK AVIVA PLUS) test strip 1 each by Other route 2 (two) times daily. Use to check blood sugars twice a day Dx E11.9 01/04/16  Yes Aleksei Plotnikov V, MD   glucose blood (ONETOUCH VERIO) test strip Use to test blood sugar once daily as instructed. Dx: E11.40 10/10/15  Yes Carlus Pavlov, MD  glucose blood test strip 1 each by Other route 2 (two) times daily. Dx E11.9 01/05/16  Yes Aleksei Plotnikov V, MD  Insulin Glargine (LANTUS SOLOSTAR) 100 UNIT/ML Solostar Pen Inject 18 Units into the skin every morning. 01/12/16  Yes Vassie Loll, MD  Insulin Pen Needle 32G X 4 MM MISC Use 1x a day 05/03/15  Yes Carlus Pavlov, MD  levothyroxine (SYNTHROID, LEVOTHROID) 137 MCG tablet Take 1 tablet (137 mcg total) by mouth daily before breakfast. 10/11/15  Yes Carlus Pavlov, MD  lisinopril (PRINIVIL,ZESTRIL) 5 MG tablet Take 1 tablet (5 mg total) by mouth 2 (two) times daily. 01/15/16  Yes Dolores Patty, MD  omeprazole (PRILOSEC) 20 MG capsule Take 20 mg by mouth daily as needed (for acid reflux/heartburn.).    Yes Historical Provider, MD  [facility] DELICA LANCETS FINE MISC Use to test blood sugar 1 time daily. Dx: E11.40 10/10/15  Yes Carlus Pavlov, MD  rosuvastatin (CRESTOR) 20 MG tablet Take 1 tablet (20 mg total) by mouth daily. 01/12/16  Yes Vassie Loll, MD  Vitamin D, Ergocalciferol, (DRISDOL) 50000 units CAPS capsule TAKE 1 CAPSULE EVERY 7 DAYS ON MONDAYS 01/15/16  Yes [name], MD  lisinopril (PRINIVIL,ZESTRIL) 5 MG tablet Take 1 tablet (5 mg total) by mouth 2 (two) times daily. 01/15/16   [name], MD   BP 110/79 mmHg  Pulse 70  Temp(Src) 98.6 F (37 C) (Oral)  Resp 25  SpO2 100%   Physical Exam General: Well-developed, well-nourished female in no acute distress; appearance consistent with age of record HENT: normocephalic; atraumatic; no right temporal tenderness Eyes: pupils equal, round and reactive to light; extraocular muscles intact Neck: supple Heart: regular rate and rhythm Lungs: clear to auscultation bilaterally Abdomen: soft; nondistended; nontender; no masses or hepatosplenomegaly; bowel sounds present Extremities: No  deformity; full range of motion; pulses normal Neurologic: Awake, alert and oriented; motor function intact in all extremities and symmetric; no facial droop Skin: Warm and dry Psychiatric: Normal mood and affect  ED Course  Procedures (including critical care time)   MDM   Nursing notes and vitals signs, including pulse oximetry, reviewed.  Summary of this visit's results, reviewed by myself:  Labs:  Results for orders placed or performed during the hospital encounter of 01/15/16 (from the past 24 hour(s))  CBG monitoring, ED     Status: Abnormal   Collection Time: 01/16/16 12:18 AM  Result Value Ref Range   Glucose-Capillary 115 (H) 65 - 99 mg/dL    Imaging Studies: Ct Head Wo Contrast  01/15/2016  CLINICAL DATA:  Headache.  Recent CVA. EXAM: CT HEAD WITHOUT CONTRAST TECHNIQUE: Contiguous axial images were obtained from the base of the skull through the vertex without intravenous contrast. COMPARISON:  CTA head neck 4 days ago FINDINGS: Skull and Sinuses:No acute finding. No sinusitis or mastoiditis to explain headache. Visualized orbits: Bilateral cataract resection. Brain: Cytotoxic edema in the right PCA territory involving the inferior right temporal lobe and right occipital lobe. No evidence of hemorrhagic conversion or infarct extension compared to prior. No hyperdense basilar. Generalized atrophy, moderate. IMPRESSION: Subacute right PCA territory infarct. No hemorrhagic conversion or other new finding to explain headache. Electronically Signed   By: Marnee Spring M.D.   On: 01/15/2016 23:47   2:38 AM Headache improved after ibuprofen. Patient ready to go home.  Final diagnoses:  Right temporal headache   I personally performed the services described in this documentation, which was scribed in my presence. The recorded information has been reviewed and is accurate.      Paula Libra, MD 01/16/16 1610  Paula Libra, MD 01/16/16 [phone]

## 2016-01-15 NOTE — Telephone Encounter (Signed)
Transition Care Management Follow-up Telephone Call   Date discharged? 01/12/16   How have you been since you were released from the hospital? Pt states she is doing alright   Do you understand why you were in the hospital? YES   Do you understand the discharge instructions? YES   Where were you discharged to? Home   Items Reviewed:  Medications reviewed: YES  Allergies reviewed: YES  Dietary changes reviewed: {YES, heart healthy & low fat diet  Referrals reviewed: NO   Functional Questionnaire:  Activities of Daily Living (ADLs):   She states she are independent in the following: ambulation, bathing and hygiene, feeding, continence, grooming, toileting and dressing States they require assistance with the following: ambulation   Any transportation issues/concerns?:NO   Any patient concerns? NO   Confirmed importance and date/time of follow-up visits scheduled YES, made 01/25/16  Provider Appointment booked with Dr. Yetta Barre  Confirmed with patient if condition begins to worsen call PCP or go to the ER.  Patient was given the office number and encouraged to call back with question or concerns.  : YES

## 2016-01-15 NOTE — Telephone Encounter (Signed)
[name] [name] is calling to get verbals on continued skilled nursing 3 week/1  Then 2 week /2  Then 1 week/ 5   She also wants to report that the patient told her that she was not lisinopril, but it was obn her med list. They are currently missing lisinopril and vit d prescriptions

## 2016-01-15 NOTE — ED Notes (Signed)
Pt recently had a stroke and was discharged from [facility] this past Friday  Pt is c/o severe headache on the right side that started 2 days ago and has been off and on since  Pt states the pain is worse tonight and states she cannot sleep

## 2016-01-15 NOTE — Telephone Encounter (Signed)
Receive call son requesting refills on mom Lisinopril & Vitamin D. Verified pharmacy inform will send ...[name]

## 2016-01-16 ENCOUNTER — Telehealth: Payer: Self-pay

## 2016-01-16 DIAGNOSIS — M150 Primary generalized (osteo)arthritis: Secondary | ICD-10-CM | POA: Diagnosis not present

## 2016-01-16 DIAGNOSIS — E1140 Type 2 diabetes mellitus with diabetic neuropathy, unspecified: Secondary | ICD-10-CM | POA: Diagnosis not present

## 2016-01-16 DIAGNOSIS — I5042 Chronic combined systolic (congestive) and diastolic (congestive) heart failure: Secondary | ICD-10-CM | POA: Diagnosis not present

## 2016-01-16 DIAGNOSIS — I2510 Atherosclerotic heart disease of native coronary artery without angina pectoris: Secondary | ICD-10-CM | POA: Diagnosis not present

## 2016-01-16 DIAGNOSIS — I69311 Memory deficit following cerebral infarction: Secondary | ICD-10-CM | POA: Diagnosis not present

## 2016-01-16 DIAGNOSIS — R51 Headache: Secondary | ICD-10-CM | POA: Diagnosis not present

## 2016-01-16 DIAGNOSIS — I69334 Monoplegia of upper limb following cerebral infarction affecting left non-dominant side: Secondary | ICD-10-CM | POA: Diagnosis not present

## 2016-01-16 LAB — CBG MONITORING, ED: GLUCOSE-CAPILLARY: 115 mg/dL — AB (ref 65–99)

## 2016-01-16 MED ORDER — VITAMIN D (ERGOCALCIFEROL) 1.25 MG (50000 UNIT) PO CAPS: ORAL_CAPSULE | ORAL | Status: DC

## 2016-01-16 MED ORDER — IBUPROFEN 200 MG PO TABS
400.0000 mg | ORAL_TABLET | Freq: Once | ORAL | Status: AC
  Administered 2016-01-16: 400 mg via ORAL
  Filled 2016-01-16: qty 2

## 2016-01-16 MED ORDER — LISINOPRIL 5 MG PO TABS: 5.0000 mg | ORAL_TABLET | Freq: Two times a day (BID) | ORAL | Status: DC

## 2016-01-16 NOTE — Telephone Encounter (Signed)
LMOVM to give verbal ok. rfs sent by corrine

## 2016-01-16 NOTE — Telephone Encounter (Signed)
Medication refills sent to pharmacy 

## 2016-01-17 ENCOUNTER — Telehealth: Payer: Self-pay | Admitting: *Deleted

## 2016-01-17 DIAGNOSIS — I5042 Chronic combined systolic (congestive) and diastolic (congestive) heart failure: Secondary | ICD-10-CM | POA: Diagnosis not present

## 2016-01-17 DIAGNOSIS — E1140 Type 2 diabetes mellitus with diabetic neuropathy, unspecified: Secondary | ICD-10-CM | POA: Diagnosis not present

## 2016-01-17 DIAGNOSIS — M150 Primary generalized (osteo)arthritis: Secondary | ICD-10-CM | POA: Diagnosis not present

## 2016-01-17 DIAGNOSIS — I69334 Monoplegia of upper limb following cerebral infarction affecting left non-dominant side: Secondary | ICD-10-CM | POA: Diagnosis not present

## 2016-01-17 DIAGNOSIS — I2510 Atherosclerotic heart disease of native coronary artery without angina pectoris: Secondary | ICD-10-CM | POA: Diagnosis not present

## 2016-01-17 DIAGNOSIS — I69311 Memory deficit following cerebral infarction: Secondary | ICD-10-CM | POA: Diagnosis not present

## 2016-01-17 NOTE — Telephone Encounter (Signed)
Left msg on triage stating wanting to report pt BP reading. Saw her today she has been c/o of dizziness at times. First check sitting 114/60 & standing drop to 80/58. She has an appt next Friday to see MD, but will be going back out this Friday to see pt...[name]

## 2016-01-18 DIAGNOSIS — I2510 Atherosclerotic heart disease of native coronary artery without angina pectoris: Secondary | ICD-10-CM | POA: Diagnosis not present

## 2016-01-18 DIAGNOSIS — E1140 Type 2 diabetes mellitus with diabetic neuropathy, unspecified: Secondary | ICD-10-CM | POA: Diagnosis not present

## 2016-01-18 DIAGNOSIS — I69334 Monoplegia of upper limb following cerebral infarction affecting left non-dominant side: Secondary | ICD-10-CM | POA: Diagnosis not present

## 2016-01-18 DIAGNOSIS — I69311 Memory deficit following cerebral infarction: Secondary | ICD-10-CM | POA: Diagnosis not present

## 2016-01-18 DIAGNOSIS — M150 Primary generalized (osteo)arthritis: Secondary | ICD-10-CM | POA: Diagnosis not present

## 2016-01-18 DIAGNOSIS — I5042 Chronic combined systolic (congestive) and diastolic (congestive) heart failure: Secondary | ICD-10-CM | POA: Diagnosis not present

## 2016-01-19 DIAGNOSIS — I5042 Chronic combined systolic (congestive) and diastolic (congestive) heart failure: Secondary | ICD-10-CM | POA: Diagnosis not present

## 2016-01-19 DIAGNOSIS — M150 Primary generalized (osteo)arthritis: Secondary | ICD-10-CM | POA: Diagnosis not present

## 2016-01-19 DIAGNOSIS — E1140 Type 2 diabetes mellitus with diabetic neuropathy, unspecified: Secondary | ICD-10-CM | POA: Diagnosis not present

## 2016-01-19 DIAGNOSIS — I2510 Atherosclerotic heart disease of native coronary artery without angina pectoris: Secondary | ICD-10-CM | POA: Diagnosis not present

## 2016-01-19 DIAGNOSIS — I69311 Memory deficit following cerebral infarction: Secondary | ICD-10-CM | POA: Diagnosis not present

## 2016-01-19 DIAGNOSIS — I69334 Monoplegia of upper limb following cerebral infarction affecting left non-dominant side: Secondary | ICD-10-CM | POA: Diagnosis not present

## 2016-01-19 LAB — PAIN MGMT, PROFILE 1 CONF W/O MM, U
Amphetamines: NEGATIVE ng/mL (ref <500)
BARBITURATES: NEGATIVE ng/mL (ref <300)
Benzodiazepines: NEGATIVE ng/mL (ref <100)
Cocaine Metabolite: NEGATIVE ng/mL (ref <150)
Creatinine: 106.4 mg/dL (ref >=20.0)
METHADONE METABOLITE: NEGATIVE ng/mL (ref <100)
Marijuana Metabolite: NEGATIVE ng/mL (ref <20)
OPIATES: NEGATIVE ng/mL (ref <100)
OXYCODONE: NEGATIVE ng/mL (ref <100)
Oxidant: NEGATIVE ug/mL (ref <200)
PHENCYCLIDINE: NEGATIVE ng/mL (ref <25)
pH: 6.28 (ref 4.5–9.0)

## 2016-01-19 LAB — PAIN MGMT, PROPOXYPHENE W/CONF, U: Propoxyphene: NEGATIVE ng/mL (ref <300)

## 2016-01-19 NOTE — Telephone Encounter (Signed)
Rec'd call from [facility] stating saw pt today wanting to let md know of pt BP. Sitting it was 160/*82 & when standing drop to 112/62 she denied feeling lightheaded or ddizzy. Will be seeing MD next Friday...[name]

## 2016-01-22 ENCOUNTER — Telehealth: Payer: Self-pay | Admitting: *Deleted

## 2016-01-22 DIAGNOSIS — I2510 Atherosclerotic heart disease of native coronary artery without angina pectoris: Secondary | ICD-10-CM | POA: Diagnosis not present

## 2016-01-22 DIAGNOSIS — I69311 Memory deficit following cerebral infarction: Secondary | ICD-10-CM | POA: Diagnosis not present

## 2016-01-22 DIAGNOSIS — I5042 Chronic combined systolic (congestive) and diastolic (congestive) heart failure: Secondary | ICD-10-CM | POA: Diagnosis not present

## 2016-01-22 DIAGNOSIS — M150 Primary generalized (osteo)arthritis: Secondary | ICD-10-CM | POA: Diagnosis not present

## 2016-01-22 DIAGNOSIS — I69334 Monoplegia of upper limb following cerebral infarction affecting left non-dominant side: Secondary | ICD-10-CM | POA: Diagnosis not present

## 2016-01-22 DIAGNOSIS — E1140 Type 2 diabetes mellitus with diabetic neuropathy, unspecified: Secondary | ICD-10-CM | POA: Diagnosis not present

## 2016-01-22 NOTE — Telephone Encounter (Signed)
Son left msg on triage stating needing to get rx for mom accu chek machine. Wrong strips was sent to pharmacy. Called son back no answer LMOM need to know correct brand of accu chek mom have...[name]

## 2016-01-23 DIAGNOSIS — E1140 Type 2 diabetes mellitus with diabetic neuropathy, unspecified: Secondary | ICD-10-CM | POA: Diagnosis not present

## 2016-01-23 DIAGNOSIS — I5042 Chronic combined systolic (congestive) and diastolic (congestive) heart failure: Secondary | ICD-10-CM | POA: Diagnosis not present

## 2016-01-23 DIAGNOSIS — I69311 Memory deficit following cerebral infarction: Secondary | ICD-10-CM | POA: Diagnosis not present

## 2016-01-23 DIAGNOSIS — M150 Primary generalized (osteo)arthritis: Secondary | ICD-10-CM | POA: Diagnosis not present

## 2016-01-23 DIAGNOSIS — I69334 Monoplegia of upper limb following cerebral infarction affecting left non-dominant side: Secondary | ICD-10-CM | POA: Diagnosis not present

## 2016-01-23 DIAGNOSIS — I2510 Atherosclerotic heart disease of native coronary artery without angina pectoris: Secondary | ICD-10-CM | POA: Diagnosis not present

## 2016-01-23 MED ORDER — GLUCOSE BLOOD VI STRP: 1.0000 | ORAL_STRIP | Freq: Two times a day (BID) | Status: DC

## 2016-01-23 NOTE — Telephone Encounter (Signed)
Called pt back spoke w/son he states he is not at home but he think it should be the accu chek aviva. Inform will send rx to [facility]...[name]

## 2016-01-24 DIAGNOSIS — I5042 Chronic combined systolic (congestive) and diastolic (congestive) heart failure: Secondary | ICD-10-CM | POA: Diagnosis not present

## 2016-01-24 DIAGNOSIS — M150 Primary generalized (osteo)arthritis: Secondary | ICD-10-CM | POA: Diagnosis not present

## 2016-01-24 DIAGNOSIS — I69311 Memory deficit following cerebral infarction: Secondary | ICD-10-CM | POA: Diagnosis not present

## 2016-01-24 DIAGNOSIS — E1140 Type 2 diabetes mellitus with diabetic neuropathy, unspecified: Secondary | ICD-10-CM | POA: Diagnosis not present

## 2016-01-24 DIAGNOSIS — I2510 Atherosclerotic heart disease of native coronary artery without angina pectoris: Secondary | ICD-10-CM | POA: Diagnosis not present

## 2016-01-24 DIAGNOSIS — I69334 Monoplegia of upper limb following cerebral infarction affecting left non-dominant side: Secondary | ICD-10-CM | POA: Diagnosis not present

## 2016-01-25 ENCOUNTER — Ambulatory Visit (INDEPENDENT_AMBULATORY_CARE_PROVIDER_SITE_OTHER): Payer: Medicare Other | Admitting: Internal Medicine

## 2016-01-25 VITALS — BP 148/80 | HR 62 | Temp 98.6°F | Ht 63.0 in | Wt 134.0 lb

## 2016-01-25 DIAGNOSIS — I5022 Chronic systolic (congestive) heart failure: Secondary | ICD-10-CM | POA: Diagnosis not present

## 2016-01-25 DIAGNOSIS — E1149 Type 2 diabetes mellitus with other diabetic neurological complication: Secondary | ICD-10-CM | POA: Diagnosis not present

## 2016-01-25 DIAGNOSIS — I10 Essential (primary) hypertension: Secondary | ICD-10-CM

## 2016-01-25 DIAGNOSIS — I63331 Cerebral infarction due to thrombosis of right posterior cerebral artery: Secondary | ICD-10-CM

## 2016-01-25 DIAGNOSIS — E1140 Type 2 diabetes mellitus with diabetic neuropathy, unspecified: Secondary | ICD-10-CM

## 2016-01-25 DIAGNOSIS — Z794 Long term (current) use of insulin: Secondary | ICD-10-CM

## 2016-01-25 MED ORDER — EMPAGLIFLOZIN 10 MG PO TABS: 10.0000 mg | ORAL_TABLET | Freq: Every day | ORAL | Status: DC

## 2016-01-25 MED ORDER — GLIPIZIDE 10 MG PO TABS: ORAL_TABLET | ORAL | Status: DC

## 2016-01-25 NOTE — Progress Notes (Signed)
Pre visit review using our clinic review tool, if applicable. No additional management support is needed unless otherwise documented below in the visit note. 

## 2016-01-25 NOTE — Progress Notes (Signed)
Subjective:  Patient ID: [name] [name], female    DOB: July 17, 1934  Age: 82 y.o. MRN: [id number]  CC: Hypertension and Diabetes   HPI [name] [name] presents for a hospital follow-up. She was recently admitted for a CVA. Since discharge she is improving with multiple therapies that are provided at home. The strength in her arms and legs is improving, her balance is improving, and the dizziness is diminishing. Her son is with her today and complains that she may have had a loss in her left visual field as he feels like she is not seeing objects that she used to see.  Recommendations for Outpatient Follow-up:  Repeat BMET to follow electrolytes and renal function  Close follow up to CBG's and further adjustments to hypoglycemic regimen as needed Will need referral to neuro IR in 3-4 weeks for evaluation/treatment option on high grade stenosis of left ICA  Outpatient Prescriptions Prior to Visit  Medication Sig Dispense Refill  . aspirin 325 MG tablet Take 1 tablet (325 mg total) by mouth daily. 30 tablet 1  . carvedilol (COREG) 12.5 MG tablet TAKE 1 TABLET TWICE A DAY WITH MEALS 180 tablet 5  . clopidogrel (PLAVIX) 75 MG tablet Take 1 tablet (75 mg total) by mouth daily. 30 tablet 2  . co-enzyme Q-10 30 MG capsule Take 1 capsule (30 mg total) by mouth daily. 30 capsule 2  . furosemide (LASIX) 20 MG tablet Take 20 mg by mouth daily as needed for fluid.     Marland Kitchen glucose blood (ACCU-CHEK AVIVA PLUS) test strip 1 each by Other route 2 (two) times daily. Use to check blood sugars twice a day Dx E11.9 100 each 2  . glucose blood test strip 1 each by Other route 2 (two) times daily. Dx E11.9 50 each 1  . Insulin Glargine (LANTUS SOLOSTAR) 100 UNIT/ML Solostar Pen Inject 18 Units into the skin every morning. 15 mL 10  . Insulin Pen Needle 32G X 4 MM MISC Use 1x a day 100 each 1  . levothyroxine (SYNTHROID, LEVOTHROID) 137 MCG tablet Take 1 tablet (137 mcg total) by mouth daily before breakfast. 90  tablet 1  . lisinopril (PRINIVIL,ZESTRIL) 5 MG tablet Take 1 tablet (5 mg total) by mouth 2 (two) times daily. 180 tablet 3  . omeprazole (PRILOSEC) 20 MG capsule Take 20 mg by mouth daily as needed (for acid reflux/heartburn.).     Marland Kitchen ONETOUCH DELICA LANCETS FINE MISC Use to test blood sugar 1 time daily. Dx: E11.40 100 each 3  . rosuvastatin (CRESTOR) 20 MG tablet Take 1 tablet (20 mg total) by mouth daily. 30 tablet 2  . Vitamin D, Ergocalciferol, (DRISDOL) 50000 units CAPS capsule TAKE 1 CAPSULE EVERY 7 DAYS ON MONDAYS 12 capsule 0  . glipiZIDE (GLUCOTROL) 10 MG tablet TAKE 1 TABLET TWICE A DAY BEFORE MEALS 180 tablet 1  . lisinopril (PRINIVIL,ZESTRIL) 5 MG tablet Take 1 tablet (5 mg total) by mouth 2 (two) times daily. 180 tablet 3   No facility-administered medications prior to visit.    ROS Review of Systems  Constitutional: Positive for fatigue. Negative for fever, chills and appetite change.  HENT: Negative.  Negative for congestion, sinus pressure, sore throat and trouble swallowing.   Eyes: Positive for visual disturbance. Negative for photophobia and redness.  Respiratory: Negative.  Negative for cough, choking, chest tightness, shortness of breath and stridor.   Cardiovascular: Negative.  Negative for chest pain, palpitations and leg swelling.  Gastrointestinal: Negative.  Negative for  nausea, vomiting, abdominal pain, diarrhea and constipation.  Endocrine: Negative.   Genitourinary: Negative.   Musculoskeletal: Negative.  Negative for myalgias, back pain, arthralgias and neck pain.  Skin: Negative.  Negative for color change and rash.  Allergic/Immunologic: Negative.   Neurological: Positive for dizziness and weakness. Negative for facial asymmetry, light-headedness, numbness and headaches.  Hematological: Negative.  Negative for adenopathy. Does not bruise/bleed easily.  Psychiatric/Behavioral: Negative.  Negative for suicidal ideas, hallucinations, behavioral problems, sleep  disturbance, dysphoric mood and decreased concentration. The patient is not nervous/anxious.     Objective:  BP 148/80 mmHg  Pulse 62  Temp(Src) 98.6 F (37 C) (Oral)  Ht 5\' 3"  (1.6 m)  Wt 134 lb (60.782 kg)  BMI 23.74 kg/m2  SpO2 97%  BP Readings from Last 3 Encounters:  01/25/16 148/80  01/16/16 102/57  01/12/16 144/63    Wt Readings from Last 3 Encounters:  01/25/16 134 lb (60.782 kg)  01/11/16 139 lb 12.8 oz (63.413 kg)  01/10/16 138 lb (62.596 kg)    Physical Exam  Constitutional: She is oriented to person, place, and time. No distress.  HENT:  Mouth/Throat: Oropharynx is clear and moist. No oropharyngeal exudate.  Eyes: Conjunctivae are normal. Right eye exhibits no discharge. Left eye exhibits no discharge. No scleral icterus.  Neck: Normal range of motion. Neck supple. No JVD present. No tracheal deviation present. No thyromegaly present.  Cardiovascular: Normal rate, regular rhythm, normal heart sounds and intact distal pulses.  Exam reveals no gallop and no friction rub.   No murmur heard. Pulmonary/Chest: Effort normal and breath sounds normal. No stridor. No respiratory distress. She has no wheezes. She has no rales. She exhibits no tenderness.  Abdominal: Soft. Bowel sounds are normal. She exhibits no distension and no mass. There is no tenderness. There is no rebound and no guarding.  Musculoskeletal: Normal range of motion. She exhibits no edema or tenderness.  Lymphadenopathy:    She has no cervical adenopathy.  Neurological: She is oriented to person, place, and time.  There are no new neurological deficits  Skin: Skin is warm and dry. No rash noted. She is not diaphoretic. No erythema. No pallor.  Psychiatric: She has a normal mood and affect. Her behavior is normal. Judgment and thought content normal.  Vitals reviewed.   Lab Results  Component Value Date   WBC 6.7 01/10/2016   HGB 11.9* 01/10/2016   HCT 36.7 01/10/2016   PLT 256 01/10/2016    GLUCOSE 104* 01/11/2016   CHOL 218* 01/11/2016   TRIG 178* 01/11/2016   HDL 30* 01/11/2016   LDLDIRECT 141.0 01/10/2016   LDLCALC 152* 01/11/2016   ALT 14 01/10/2016   AST 22 01/10/2016   NA 139 01/11/2016   K 3.1* 01/11/2016   CL 106 01/11/2016   CREATININE 0.92 01/11/2016   BUN 9 01/11/2016   CO2 24 01/11/2016   TSH 0.31* 01/10/2016   INR 1.08 01/10/2016   HGBA1C 11.4* 01/11/2016   MICROALBUR 3.6* 01/10/2016    Ct Head Wo Contrast  01/15/2016  CLINICAL DATA:  Headache.  Recent CVA. EXAM: CT HEAD WITHOUT CONTRAST TECHNIQUE: Contiguous axial images were obtained from the base of the skull through the vertex without intravenous contrast. COMPARISON:  CTA head neck 4 days ago FINDINGS: Skull and Sinuses:No acute finding. No sinusitis or mastoiditis to explain headache. Visualized orbits: Bilateral cataract resection. Brain: Cytotoxic edema in the right PCA territory involving the inferior right temporal lobe and right occipital lobe. No evidence of  hemorrhagic conversion or infarct extension compared to prior. No hyperdense basilar. Generalized atrophy, moderate. IMPRESSION: Subacute right PCA territory infarct. No hemorrhagic conversion or other new finding to explain headache. Electronically Signed   By: Marnee Spring M.D.   On: 01/15/2016 23:47    Assessment & Plan:   [name] [name] was seen today for hypertension and diabetes.  Diagnoses and all orders for this visit:  Type 2 diabetes mellitus with diabetic neuropathy, with long-term current use of insulin (HCC)- Her blood sugar is not adequately well controlled, I've asked her to continue the insulin therapy as well as the sulfonylurea, I think she would benefit from adding an SGLT2 inhibitor as well. I will make sure that she follows up with her endocrinologist as previously directed. -     empagliflozin (JARDIANCE) 10 MG TABS tablet; Take 10 mg by mouth daily. -     glipiZIDE (GLUCOTROL) 10 MG tablet; TAKE 1 TABLET TWICE A DAY BEFORE  MEALS  Type 2 diabetes mellitus with other diabetic neurological complication (HCC)- as above -     glipiZIDE (GLUCOTROL) 10 MG tablet; TAKE 1 TABLET TWICE A DAY BEFORE MEALS  Chronic systolic heart failure (HCC)- she has a normal volume status today, will continue to control her blood pressure and other risk factors for fluid retention.  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  I am having [name] [name] start on empagliflozin. I am also having her maintain her carvedilol, Insulin Pen Needle, furosemide, omeprazole, ONETOUCH DELICA LANCETS FINE, levothyroxine, glucose blood, aspirin, clopidogrel, Insulin Glargine, rosuvastatin, co-enzyme Q-10, lisinopril, Vitamin D (Ergocalciferol), glucose blood, and glipiZIDE.  Meds ordered this encounter  Medications  . empagliflozin (JARDIANCE) 10 MG TABS tablet    Sig: Take 10 mg by mouth daily.    Dispense:  90 tablet    Refill:  1  . glipiZIDE (GLUCOTROL) 10 MG tablet    Sig: TAKE 1 TABLET TWICE A DAY BEFORE MEALS    Dispense:  180 tablet    Refill:  1     Follow-up: Return in about 4 months (around 05/26/2016).  Sanda Linger, MD

## 2016-01-25 NOTE — Patient Instructions (Signed)

## 2016-01-26 DIAGNOSIS — I5042 Chronic combined systolic (congestive) and diastolic (congestive) heart failure: Secondary | ICD-10-CM | POA: Diagnosis not present

## 2016-01-26 DIAGNOSIS — E1140 Type 2 diabetes mellitus with diabetic neuropathy, unspecified: Secondary | ICD-10-CM | POA: Diagnosis not present

## 2016-01-26 DIAGNOSIS — M150 Primary generalized (osteo)arthritis: Secondary | ICD-10-CM | POA: Diagnosis not present

## 2016-01-26 DIAGNOSIS — I2510 Atherosclerotic heart disease of native coronary artery without angina pectoris: Secondary | ICD-10-CM | POA: Diagnosis not present

## 2016-01-26 DIAGNOSIS — I69334 Monoplegia of upper limb following cerebral infarction affecting left non-dominant side: Secondary | ICD-10-CM | POA: Diagnosis not present

## 2016-01-26 DIAGNOSIS — I69311 Memory deficit following cerebral infarction: Secondary | ICD-10-CM | POA: Diagnosis not present

## 2016-01-28 ENCOUNTER — Encounter: Payer: Self-pay | Admitting: Internal Medicine

## 2016-01-29 ENCOUNTER — Telehealth: Payer: Self-pay | Admitting: *Deleted

## 2016-01-29 DIAGNOSIS — I69334 Monoplegia of upper limb following cerebral infarction affecting left non-dominant side: Secondary | ICD-10-CM | POA: Diagnosis not present

## 2016-01-29 DIAGNOSIS — E1140 Type 2 diabetes mellitus with diabetic neuropathy, unspecified: Secondary | ICD-10-CM | POA: Diagnosis not present

## 2016-01-29 DIAGNOSIS — I69311 Memory deficit following cerebral infarction: Secondary | ICD-10-CM | POA: Diagnosis not present

## 2016-01-29 DIAGNOSIS — I5042 Chronic combined systolic (congestive) and diastolic (congestive) heart failure: Secondary | ICD-10-CM | POA: Diagnosis not present

## 2016-01-29 DIAGNOSIS — M150 Primary generalized (osteo)arthritis: Secondary | ICD-10-CM | POA: Diagnosis not present

## 2016-01-29 DIAGNOSIS — I2510 Atherosclerotic heart disease of native coronary artery without angina pectoris: Secondary | ICD-10-CM | POA: Diagnosis not present

## 2016-01-29 MED ORDER — GLUCOSE BLOOD VI STRP: 1.0000 | ORAL_STRIP | Freq: Two times a day (BID) | Status: DC

## 2016-01-29 MED ORDER — ACCU-CHEK MULTICLIX LANCETS MISC
1.0000 | Freq: Two times a day (BID) | Status: DC
Start: 2016-01-29 — End: 2016-04-05

## 2016-01-29 NOTE — Telephone Encounter (Signed)
Son call back mom uses [employer] chek Aviva. [facility] lancets...[name]

## 2016-01-29 NOTE — Telephone Encounter (Signed)
Left msg on triage needing to speak w/nurse concerning mom BS strips. The one she has is not the correct ones, and [facility]. Called son back no answer LMOM RTC I need to know what correct meter mom is using...[name]

## 2016-01-30 DIAGNOSIS — I2510 Atherosclerotic heart disease of native coronary artery without angina pectoris: Secondary | ICD-10-CM | POA: Diagnosis not present

## 2016-01-30 DIAGNOSIS — I69334 Monoplegia of upper limb following cerebral infarction affecting left non-dominant side: Secondary | ICD-10-CM | POA: Diagnosis not present

## 2016-01-30 DIAGNOSIS — E1140 Type 2 diabetes mellitus with diabetic neuropathy, unspecified: Secondary | ICD-10-CM | POA: Diagnosis not present

## 2016-01-30 DIAGNOSIS — M150 Primary generalized (osteo)arthritis: Secondary | ICD-10-CM | POA: Diagnosis not present

## 2016-01-30 DIAGNOSIS — I69311 Memory deficit following cerebral infarction: Secondary | ICD-10-CM | POA: Diagnosis not present

## 2016-01-30 DIAGNOSIS — I5042 Chronic combined systolic (congestive) and diastolic (congestive) heart failure: Secondary | ICD-10-CM | POA: Diagnosis not present

## 2016-01-31 ENCOUNTER — Telehealth: Payer: Self-pay

## 2016-01-31 DIAGNOSIS — I2510 Atherosclerotic heart disease of native coronary artery without angina pectoris: Secondary | ICD-10-CM | POA: Diagnosis not present

## 2016-01-31 DIAGNOSIS — E1140 Type 2 diabetes mellitus with diabetic neuropathy, unspecified: Secondary | ICD-10-CM | POA: Diagnosis not present

## 2016-01-31 DIAGNOSIS — I69334 Monoplegia of upper limb following cerebral infarction affecting left non-dominant side: Secondary | ICD-10-CM | POA: Diagnosis not present

## 2016-01-31 DIAGNOSIS — I69311 Memory deficit following cerebral infarction: Secondary | ICD-10-CM | POA: Diagnosis not present

## 2016-01-31 DIAGNOSIS — I5042 Chronic combined systolic (congestive) and diastolic (congestive) heart failure: Secondary | ICD-10-CM | POA: Diagnosis not present

## 2016-01-31 DIAGNOSIS — M150 Primary generalized (osteo)arthritis: Secondary | ICD-10-CM | POA: Diagnosis not present

## 2016-01-31 NOTE — Telephone Encounter (Signed)
PA request form received from [facility].  Form completed and placed on MD's desk for signature and fax back

## 2016-01-31 NOTE — Telephone Encounter (Signed)
Paperwork signed and faxed.

## 2016-02-02 ENCOUNTER — Telehealth: Payer: Self-pay | Admitting: Internal Medicine

## 2016-02-02 DIAGNOSIS — I69311 Memory deficit following cerebral infarction: Secondary | ICD-10-CM | POA: Diagnosis not present

## 2016-02-02 DIAGNOSIS — I2510 Atherosclerotic heart disease of native coronary artery without angina pectoris: Secondary | ICD-10-CM | POA: Diagnosis not present

## 2016-02-02 DIAGNOSIS — I5042 Chronic combined systolic (congestive) and diastolic (congestive) heart failure: Secondary | ICD-10-CM | POA: Diagnosis not present

## 2016-02-02 DIAGNOSIS — I69334 Monoplegia of upper limb following cerebral infarction affecting left non-dominant side: Secondary | ICD-10-CM | POA: Diagnosis not present

## 2016-02-02 DIAGNOSIS — M150 Primary generalized (osteo)arthritis: Secondary | ICD-10-CM | POA: Diagnosis not present

## 2016-02-02 DIAGNOSIS — E1140 Type 2 diabetes mellitus with diabetic neuropathy, unspecified: Secondary | ICD-10-CM | POA: Diagnosis not present

## 2016-02-02 NOTE — Telephone Encounter (Signed)
Requesting script on freestyle glucose meter.

## 2016-02-02 NOTE — Telephone Encounter (Signed)
APPROVED through 08/11/2098

## 2016-02-05 DIAGNOSIS — I69334 Monoplegia of upper limb following cerebral infarction affecting left non-dominant side: Secondary | ICD-10-CM | POA: Diagnosis not present

## 2016-02-05 DIAGNOSIS — E1140 Type 2 diabetes mellitus with diabetic neuropathy, unspecified: Secondary | ICD-10-CM | POA: Diagnosis not present

## 2016-02-05 DIAGNOSIS — I5042 Chronic combined systolic (congestive) and diastolic (congestive) heart failure: Secondary | ICD-10-CM | POA: Diagnosis not present

## 2016-02-05 DIAGNOSIS — I2510 Atherosclerotic heart disease of native coronary artery without angina pectoris: Secondary | ICD-10-CM | POA: Diagnosis not present

## 2016-02-05 DIAGNOSIS — I69311 Memory deficit following cerebral infarction: Secondary | ICD-10-CM | POA: Diagnosis not present

## 2016-02-05 DIAGNOSIS — M150 Primary generalized (osteo)arthritis: Secondary | ICD-10-CM | POA: Diagnosis not present

## 2016-02-05 MED ORDER — FREESTYLE FREEDOM LITE W/DEVICE KIT: PACK | Status: DC

## 2016-02-05 MED ORDER — GLUCOSE BLOOD VI STRP: 1.0000 | ORAL_STRIP | Freq: Two times a day (BID) | Status: DC

## 2016-02-05 NOTE — Telephone Encounter (Signed)
Faxed rx for monitor w/supplies...[name]

## 2016-02-05 NOTE — Telephone Encounter (Signed)
Spoke to pt son and rx needs to go to [facility].

## 2016-02-06 DIAGNOSIS — I69334 Monoplegia of upper limb following cerebral infarction affecting left non-dominant side: Secondary | ICD-10-CM | POA: Diagnosis not present

## 2016-02-06 DIAGNOSIS — M150 Primary generalized (osteo)arthritis: Secondary | ICD-10-CM | POA: Diagnosis not present

## 2016-02-06 DIAGNOSIS — I5042 Chronic combined systolic (congestive) and diastolic (congestive) heart failure: Secondary | ICD-10-CM | POA: Diagnosis not present

## 2016-02-06 DIAGNOSIS — E1140 Type 2 diabetes mellitus with diabetic neuropathy, unspecified: Secondary | ICD-10-CM | POA: Diagnosis not present

## 2016-02-06 DIAGNOSIS — I69311 Memory deficit following cerebral infarction: Secondary | ICD-10-CM | POA: Diagnosis not present

## 2016-02-06 DIAGNOSIS — I2510 Atherosclerotic heart disease of native coronary artery without angina pectoris: Secondary | ICD-10-CM | POA: Diagnosis not present

## 2016-02-07 DIAGNOSIS — I5042 Chronic combined systolic (congestive) and diastolic (congestive) heart failure: Secondary | ICD-10-CM | POA: Diagnosis not present

## 2016-02-07 DIAGNOSIS — M150 Primary generalized (osteo)arthritis: Secondary | ICD-10-CM | POA: Diagnosis not present

## 2016-02-07 DIAGNOSIS — I69311 Memory deficit following cerebral infarction: Secondary | ICD-10-CM | POA: Diagnosis not present

## 2016-02-07 DIAGNOSIS — I2510 Atherosclerotic heart disease of native coronary artery without angina pectoris: Secondary | ICD-10-CM | POA: Diagnosis not present

## 2016-02-07 DIAGNOSIS — E1140 Type 2 diabetes mellitus with diabetic neuropathy, unspecified: Secondary | ICD-10-CM | POA: Diagnosis not present

## 2016-02-07 DIAGNOSIS — I69334 Monoplegia of upper limb following cerebral infarction affecting left non-dominant side: Secondary | ICD-10-CM | POA: Diagnosis not present

## 2016-02-09 ENCOUNTER — Other Ambulatory Visit: Payer: Self-pay | Admitting: Internal Medicine

## 2016-02-09 ENCOUNTER — Telehealth: Payer: Self-pay

## 2016-02-09 DIAGNOSIS — E1140 Type 2 diabetes mellitus with diabetic neuropathy, unspecified: Secondary | ICD-10-CM | POA: Diagnosis not present

## 2016-02-09 DIAGNOSIS — M150 Primary generalized (osteo)arthritis: Secondary | ICD-10-CM | POA: Diagnosis not present

## 2016-02-09 DIAGNOSIS — I2510 Atherosclerotic heart disease of native coronary artery without angina pectoris: Secondary | ICD-10-CM | POA: Diagnosis not present

## 2016-02-09 DIAGNOSIS — E0940 Drug or chemical induced diabetes mellitus with neurological complications with diabetic neuropathy, unspecified: Secondary | ICD-10-CM

## 2016-02-09 DIAGNOSIS — I69311 Memory deficit following cerebral infarction: Secondary | ICD-10-CM | POA: Diagnosis not present

## 2016-02-09 DIAGNOSIS — I5042 Chronic combined systolic (congestive) and diastolic (congestive) heart failure: Secondary | ICD-10-CM | POA: Diagnosis not present

## 2016-02-09 DIAGNOSIS — I69334 Monoplegia of upper limb following cerebral infarction affecting left non-dominant side: Secondary | ICD-10-CM | POA: Diagnosis not present

## 2016-02-09 DIAGNOSIS — I420 Dilated cardiomyopathy: Secondary | ICD-10-CM

## 2016-02-09 NOTE — Telephone Encounter (Signed)
[name] [name] had [facility] P.T needs a referral to able to do outpatient p.t and o.t Can you please follow up. Thank you.  [facility] # is: [phone] Fax [phone]

## 2016-02-12 ENCOUNTER — Encounter: Payer: Self-pay | Admitting: Podiatry

## 2016-02-12 ENCOUNTER — Ambulatory Visit (INDEPENDENT_AMBULATORY_CARE_PROVIDER_SITE_OTHER): Payer: Medicare Other | Admitting: Podiatry

## 2016-02-12 DIAGNOSIS — M79676 Pain in unspecified toe(s): Secondary | ICD-10-CM | POA: Diagnosis not present

## 2016-02-12 DIAGNOSIS — M79609 Pain in unspecified limb: Principal | ICD-10-CM

## 2016-02-12 DIAGNOSIS — B351 Tinea unguium: Secondary | ICD-10-CM | POA: Diagnosis not present

## 2016-02-12 DIAGNOSIS — M150 Primary generalized (osteo)arthritis: Secondary | ICD-10-CM | POA: Diagnosis not present

## 2016-02-12 DIAGNOSIS — E1040 Type 1 diabetes mellitus with diabetic neuropathy, unspecified: Secondary | ICD-10-CM

## 2016-02-12 DIAGNOSIS — I69334 Monoplegia of upper limb following cerebral infarction affecting left non-dominant side: Secondary | ICD-10-CM | POA: Diagnosis not present

## 2016-02-12 DIAGNOSIS — I5042 Chronic combined systolic (congestive) and diastolic (congestive) heart failure: Secondary | ICD-10-CM | POA: Diagnosis not present

## 2016-02-12 DIAGNOSIS — I2510 Atherosclerotic heart disease of native coronary artery without angina pectoris: Secondary | ICD-10-CM | POA: Diagnosis not present

## 2016-02-12 DIAGNOSIS — I69311 Memory deficit following cerebral infarction: Secondary | ICD-10-CM | POA: Diagnosis not present

## 2016-02-12 DIAGNOSIS — Q828 Other specified congenital malformations of skin: Secondary | ICD-10-CM

## 2016-02-12 DIAGNOSIS — E1140 Type 2 diabetes mellitus with diabetic neuropathy, unspecified: Secondary | ICD-10-CM | POA: Diagnosis not present

## 2016-02-12 NOTE — Telephone Encounter (Signed)
Okay for referral for PT and OT?

## 2016-02-12 NOTE — Telephone Encounter (Signed)
yes

## 2016-02-12 NOTE — Progress Notes (Signed)
Subjective:     Patient ID: [name] [name], female   DOB: Aug 07, 1934, 82 y.o.   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

## 2016-02-12 NOTE — Telephone Encounter (Signed)
Referral entered  

## 2016-02-14 ENCOUNTER — Telehealth: Payer: Self-pay

## 2016-02-14 DIAGNOSIS — M150 Primary generalized (osteo)arthritis: Secondary | ICD-10-CM | POA: Diagnosis not present

## 2016-02-14 DIAGNOSIS — I5042 Chronic combined systolic (congestive) and diastolic (congestive) heart failure: Secondary | ICD-10-CM | POA: Diagnosis not present

## 2016-02-14 DIAGNOSIS — E1140 Type 2 diabetes mellitus with diabetic neuropathy, unspecified: Secondary | ICD-10-CM | POA: Diagnosis not present

## 2016-02-14 DIAGNOSIS — I2510 Atherosclerotic heart disease of native coronary artery without angina pectoris: Secondary | ICD-10-CM | POA: Diagnosis not present

## 2016-02-14 DIAGNOSIS — I69334 Monoplegia of upper limb following cerebral infarction affecting left non-dominant side: Secondary | ICD-10-CM | POA: Diagnosis not present

## 2016-02-14 DIAGNOSIS — I69311 Memory deficit following cerebral infarction: Secondary | ICD-10-CM | POA: Diagnosis not present

## 2016-02-14 NOTE — Telephone Encounter (Signed)
Home Health Cert/Plan of Care received signed (01/13/2016 - 03/12/2016). Copy sent to scan

## 2016-02-19 ENCOUNTER — Telehealth: Payer: Self-pay | Admitting: Emergency Medicine

## 2016-02-19 DIAGNOSIS — M150 Primary generalized (osteo)arthritis: Secondary | ICD-10-CM | POA: Diagnosis not present

## 2016-02-19 DIAGNOSIS — I69334 Monoplegia of upper limb following cerebral infarction affecting left non-dominant side: Secondary | ICD-10-CM | POA: Diagnosis not present

## 2016-02-19 DIAGNOSIS — I2510 Atherosclerotic heart disease of native coronary artery without angina pectoris: Secondary | ICD-10-CM | POA: Diagnosis not present

## 2016-02-19 DIAGNOSIS — I5042 Chronic combined systolic (congestive) and diastolic (congestive) heart failure: Secondary | ICD-10-CM | POA: Diagnosis not present

## 2016-02-19 DIAGNOSIS — E1140 Type 2 diabetes mellitus with diabetic neuropathy, unspecified: Secondary | ICD-10-CM | POA: Diagnosis not present

## 2016-02-19 DIAGNOSIS — I69311 Memory deficit following cerebral infarction: Secondary | ICD-10-CM | POA: Diagnosis not present

## 2016-02-19 NOTE — Telephone Encounter (Signed)
Patients blood sugar this morning was 53. They are going to keep a long for the nurse to make sure it doesn't stay low. Also her blood pressure was 96/68 sitting and 92/58 standing. Nurse will follow up just wanted to make sure Dr Yetta Barre was aware. If there are any recommendations please let her know. Thanks.

## 2016-02-19 NOTE — Telephone Encounter (Signed)
Use Coreg 1/2 tab bid and hold Glipizide for 1 day and then re-start at 1/2 tab bid Thx

## 2016-02-19 NOTE — Telephone Encounter (Signed)
Please advise in dr jone's absence, thanks---i will call patient or care provider back

## 2016-02-20 NOTE — Telephone Encounter (Signed)
Left message advising megan of dr plotnikovs recommendations, can call back and talk with tamara if any questions

## 2016-02-28 ENCOUNTER — Telehealth: Payer: Self-pay | Admitting: Internal Medicine

## 2016-02-28 DIAGNOSIS — I69311 Memory deficit following cerebral infarction: Secondary | ICD-10-CM | POA: Diagnosis not present

## 2016-02-28 DIAGNOSIS — I5042 Chronic combined systolic (congestive) and diastolic (congestive) heart failure: Secondary | ICD-10-CM | POA: Diagnosis not present

## 2016-02-28 DIAGNOSIS — I2510 Atherosclerotic heart disease of native coronary artery without angina pectoris: Secondary | ICD-10-CM | POA: Diagnosis not present

## 2016-02-28 DIAGNOSIS — E1140 Type 2 diabetes mellitus with diabetic neuropathy, unspecified: Secondary | ICD-10-CM | POA: Diagnosis not present

## 2016-02-28 DIAGNOSIS — I69334 Monoplegia of upper limb following cerebral infarction affecting left non-dominant side: Secondary | ICD-10-CM | POA: Diagnosis not present

## 2016-02-28 DIAGNOSIS — M150 Primary generalized (osteo)arthritis: Secondary | ICD-10-CM | POA: Diagnosis not present

## 2016-02-28 NOTE — Telephone Encounter (Signed)
Wanted to let provider know that family and patient wants to pursue outpatient therapy.  Has discharged patient today.

## 2016-03-12 DIAGNOSIS — E113593 Type 2 diabetes mellitus with proliferative diabetic retinopathy without macular edema, bilateral: Secondary | ICD-10-CM | POA: Diagnosis not present

## 2016-03-26 DIAGNOSIS — I2510 Atherosclerotic heart disease of native coronary artery without angina pectoris: Secondary | ICD-10-CM | POA: Diagnosis not present

## 2016-03-26 DIAGNOSIS — E1140 Type 2 diabetes mellitus with diabetic neuropathy, unspecified: Secondary | ICD-10-CM | POA: Diagnosis not present

## 2016-03-26 DIAGNOSIS — I69311 Memory deficit following cerebral infarction: Secondary | ICD-10-CM | POA: Diagnosis not present

## 2016-03-27 ENCOUNTER — Ambulatory Visit (INDEPENDENT_AMBULATORY_CARE_PROVIDER_SITE_OTHER): Payer: Medicare Other | Admitting: *Deleted

## 2016-03-27 DIAGNOSIS — I429 Cardiomyopathy, unspecified: Secondary | ICD-10-CM | POA: Diagnosis not present

## 2016-03-27 DIAGNOSIS — I428 Other cardiomyopathies: Secondary | ICD-10-CM

## 2016-03-27 DIAGNOSIS — Z950 Presence of cardiac pacemaker: Secondary | ICD-10-CM

## 2016-03-27 DIAGNOSIS — I5022 Chronic systolic (congestive) heart failure: Secondary | ICD-10-CM

## 2016-03-27 NOTE — Progress Notes (Signed)
Remote pacemaker transmission.   

## 2016-03-28 ENCOUNTER — Encounter: Payer: Self-pay | Admitting: Cardiology

## 2016-03-29 ENCOUNTER — Encounter: Payer: Self-pay | Admitting: Neurology

## 2016-03-29 ENCOUNTER — Ambulatory Visit (INDEPENDENT_AMBULATORY_CARE_PROVIDER_SITE_OTHER): Payer: Medicare Other | Admitting: Neurology

## 2016-03-29 ENCOUNTER — Ambulatory Visit: Payer: Medicare Other | Admitting: Neurology

## 2016-03-29 VITALS — BP 106/60 | HR 68 | Ht 63.0 in | Wt 124.0 lb

## 2016-03-29 DIAGNOSIS — I639 Cerebral infarction, unspecified: Secondary | ICD-10-CM

## 2016-03-29 DIAGNOSIS — I6522 Occlusion and stenosis of left carotid artery: Secondary | ICD-10-CM | POA: Diagnosis not present

## 2016-03-29 LAB — CUP PACEART REMOTE DEVICE CHECK
Battery Remaining Longevity: 91 mo
Brady Statistic AP VP Percent: 21 %
Brady Statistic AP VS Percent: 1 %
Brady Statistic AS VS Percent: 1 %
Implantable Lead Implant Date: 20150220
Implantable Lead Location: 753858
Implantable Lead Location: 753860
Lead Channel Impedance Value: 380 Ohm
Lead Channel Impedance Value: 930 Ohm
Lead Channel Pacing Threshold Amplitude: 1.25 V
Lead Channel Pacing Threshold Amplitude: 1.25 V
Lead Channel Sensing Intrinsic Amplitude: 11.4 mV
Lead Channel Setting Pacing Amplitude: 1.75 V
Lead Channel Setting Pacing Pulse Width: 0.4 ms
Lead Channel Setting Sensing Sensitivity: 2 mV
MDC IDC LEAD IMPLANT DT: 20150220
MDC IDC LEAD IMPLANT DT: 20150220
MDC IDC LEAD LOCATION: 753859
MDC IDC MSMT BATTERY REMAINING PERCENTAGE: 95.5 %
MDC IDC MSMT BATTERY VOLTAGE: 2.98 V
MDC IDC MSMT LEADCHNL LV PACING THRESHOLD PULSEWIDTH: 1 ms
MDC IDC MSMT LEADCHNL RA PACING THRESHOLD AMPLITUDE: 0.75 V
MDC IDC MSMT LEADCHNL RA PACING THRESHOLD PULSEWIDTH: 0.4 ms
MDC IDC MSMT LEADCHNL RA SENSING INTR AMPL: 2.4 mV
MDC IDC MSMT LEADCHNL RV IMPEDANCE VALUE: 410 Ohm
MDC IDC MSMT LEADCHNL RV PACING THRESHOLD PULSEWIDTH: 0.4 ms
MDC IDC PG MODEL: 3242
MDC IDC PG SERIAL: 7578583
MDC IDC SESS DTM: 20170816091134
MDC IDC SET LEADCHNL LV PACING AMPLITUDE: 2.25 V
MDC IDC SET LEADCHNL LV PACING PULSEWIDTH: 1 ms
MDC IDC SET LEADCHNL RV PACING AMPLITUDE: 2.25 V
MDC IDC STAT BRADY AS VP PERCENT: 79 %
MDC IDC STAT BRADY RA PERCENT PACED: 21 %

## 2016-03-29 NOTE — Progress Notes (Signed)
[facility] [address] [place]. [place] [address] [phone]       OFFICE FOLLOW-UP NOTE  [name]. [name] [name] Date of Birth:  [birth year] Medical Record Number:  [id number]   HPI: [name] [name] is a 64 year pleasant African-American lady is seen today for the first office follow-up visit following hospital admission for stroke in May 2017. She is accompanied by her son. 82 y.o. female with medical history significant of hypothyroidism, hyperlipidemia, osteoarthritis, type 2 diabetes, CAD, left bundle branch block, chronic combined systolic and diastolic CHF, GERD who came to the emergency department due to recurrent dizziness episodes and unsteady gait for almost 2 weeks. She denies numbness or weakness, slurred speech or language articulation/comprehension difficulty. She describes this sensation as feeling drunk.. She presented outside time window for thrombolysis. MRI scan of the brain showed right posterior cerebral artery infarct and CT angiogram showed abrupt occlusion of the right posterior cerebral artery. There was a string sign and critical stenosis of the proximal left ICA at the origin as well as moderate to severe stenosis of both cavernous carotid arteries. Transthoracic echo showed normal ejection fraction. Hemoglobin A1c was significantly elevated at 11.2 and LDL cholesterol at 152 mg percent. Patient was started on aspirin and Plavix for stroke prevention given large vessel intracranial stenosis as well as on medications cholesterol and diabetes. Vascular surgery was consulted for asymptomatic carotid stenosis but given multivessel) extra cranial stenosis deferred treatment options to neuroradiology. Patient continues to have dense left-sided vision loss and walks with a cane. She is living at home. She has finished home physical occupational therapy and plans to start outpatient therapy next week. She does occasionally bump into objects in its walls and has to be  careful. She is tolerating aspirin and Plavix without bleeding and bruising so far. She did have significant short-term memory and cognitive difficulties initially but these seem to be gradually improving as per son. Patient has not yet seen Dr. Estanislado Pandy for discussion of asymptomatic carotid revascularization.  ROS:   14 system review of systems is positive for  activity change, runny nose, loss of vision, cold intolerance, memory loss, joint pain and all other systems negative  PMH:  Past Medical History:  Diagnosis Date  . Chronic combined systolic and diastolic CHF, NYHA class 3 ([place])    a. 03/2013 Echo: EF 25-30%, diff HK, sev antsept HK, mild MR.  . Congestive dilated cardiomyopathy ([place])    a. 03/2013 Echo: EF 25-30%;  b. 09/2013 s/p SJM 3242 CRT-P.  Marland Kitchen Coronary artery disease    a. 08/2012 Cath: LM nl, LAD 49m LCX nondom, mild-mod nonobs dzs mid, OM1 ok, OM2 90/90p, RCA 40, EF 25-30%-->Med Rx.  . GERD (gastroesophageal reflux disease)   . High cholesterol   . Hypertension   . Hypothyroidism   . Left bundle branch block   . Osteoarthritis   . Stroke ([place]   . [place] II diabetes mellitus [place]     Social History:  Social History   Social History  . Marital status: Divorced    Spouse name: N/A  . Number of children: 4  . Years of education: N/A   Occupational History  . Retired NMarine scientist   Social History Main Topics  . Smoking status: Former SResearch scientist (life sciences) . Smokeless tobacco: Former USystems developer . Alcohol use No  . Drug use: No  . Sexual activity: Not Currently   Other Topics Concern  . Not on file   Social History Narrative  Widowed.  Lives with her son.  Normally able to ambulate without assistance.  Quit smoking as a teenager.               Medications:   Current Outpatient Prescriptions on File Prior to Visit  Medication Sig Dispense Refill  . aspirin 325 MG tablet Take 1 tablet (325 mg total) by mouth daily. (Patient taking differently: Take 81 mg by mouth daily. ) 30  tablet 1  . Blood Glucose Monitoring Suppl (FREESTYLE FREEDOM LITE) w/Device KIT Use as directed to check blood sugar Dx E11.9 1 each 0  . carvedilol (COREG) 12.5 MG tablet TAKE 1 TABLET TWICE A DAY WITH MEALS 180 tablet 5  . clopidogrel (PLAVIX) 75 MG tablet Take 1 tablet (75 mg total) by mouth daily. 30 tablet 2  . co-enzyme Q-10 30 MG capsule Take 1 capsule (30 mg total) by mouth daily. 30 capsule 2  . furosemide (LASIX) 20 MG tablet Take 20 mg by mouth daily as needed for fluid.     Marland Kitchen glipiZIDE (GLUCOTROL) 10 MG tablet TAKE 1 TABLET TWICE A DAY BEFORE MEALS 180 tablet 1  . glucose blood (ACCU-CHEK AVIVA PLUS) test strip 1 each by Other route 2 (two) times daily. Use to check blood sugars twice a day Dx E11.9 100 each 3  . glucose blood (FREESTYLE LITE) test strip 1 each by Other route 2 (two) times daily. Use to check blood sugars twice a day Dx E11.9 100 each 3  . Insulin Glargine (LANTUS SOLOSTAR) 100 UNIT/ML Solostar Pen Inject 18 Units into the skin every morning. 15 mL 10  . Insulin Pen Needle 32G X 4 MM MISC Use 1x a day 100 each 1  . Lancets (ACCU-CHEK MULTICLIX) lancets 1 each by Other route 2 (two) times daily. Use to help check blood sugars twice a day Dx e11.9 100 each 3  . levothyroxine (SYNTHROID, LEVOTHROID) 137 MCG tablet Take 1 tablet (137 mcg total) by mouth daily before breakfast. 90 tablet 1  . lisinopril (PRINIVIL,ZESTRIL) 5 MG tablet Take 1 tablet (5 mg total) by mouth 2 (two) times daily. 180 tablet 3  . omeprazole (PRILOSEC) 20 MG capsule Take 20 mg by mouth daily as needed (for acid reflux/heartburn.).     Marland Kitchen ONETOUCH DELICA LANCETS FINE MISC Use to test blood sugar 1 time daily. Dx: E11.40 100 each 3  . rosuvastatin (CRESTOR) 20 MG tablet Take 1 tablet (20 mg total) by mouth daily. 30 tablet 2  . Vitamin D, Ergocalciferol, (DRISDOL) 50000 units CAPS capsule TAKE 1 CAPSULE EVERY 7 DAYS ON MONDAYS 12 capsule 0   No current facility-administered medications on file  prior to visit.     Allergies:   Allergies  Allergen Reactions  . Statins Other (See Comments)    Leg cramping  . Metformin And Related     Pt stopped it due to diarrhea  . Vytorin [Ezetimibe-Simvastatin] Other (See Comments)    Leg cramps    Physical Exam General: Frail elderly African-American, seated, in no evident distress Head: head normocephalic and atraumatic.  Neck: supple with no carotid or supraclavicular bruits Cardiovascular: regular rate and rhythm, no murmurs Musculoskeletal: no deformity Skin:  no rash/petichiae Vascular:  Normal pulses all extremities Vitals:   03/29/16 1124  BP: 106/60  Pulse: 68   Neurologic Exam Mental Status: Awake and fully alert. Oriented to place and time. Recent and remote memory intact. Attention span, concentration and fund of knowledge appropriate. Mood and affect appropriate.  Cranial Nerves: Fundoscopic exam reveals sharp disc margins. Pupils equal, briskly reactive to light. Extraocular movements full without nystagmus. Visual fields show dense left homonymous hemianopsia l to confrontation. Hearing intact. Facial sensation intact. Face, tongue, palate moves normally and symmetrically.  Motor: Normal bulk and tone. Normal strength in all tested extremity muscles. Diminished fine finger movements on the left. Orbits right over left upper extremity. Sensory.: intact to touch ,pinprick .position and vibratory sensation.  Coordination: Rapid alternating movements normal in all extremities. Finger-to-nose and heel-to-shin performed accurately bilaterally. Gait and Station: Arises from chair without difficulty. Stance is normal. Gait demonstrates normal stride length but mild imbalance .  Reflexes: 1+ and symmetric. Toes downgoing.   NIHSS 3Modified Rankin  3  ASSESSMENT: 73 year African-American lady with embolic right posterior cerebral artery infarct in May 2017 with asymptomatic severe left proximal carotid and bilateral cavernous  carotid stenosis. Vascular risk factors of hypertension, hyperlipidemia, diabetes, congestive heart failure and large vessel cerebrovascular disease    PLAN: I had a long d/w patient and her son about her recent embolic stroke, severe asymptomatic carotid stenosis risk for recurrent stroke/TIAs, personally independently reviewed imaging studies and stroke evaluation results and answered questions.Continue aspirin 325 mg daily and Plavix 75 mg for secondary stroke prevention and maintain strict control of hypertension with blood pressure goal below 130/90, diabetes with hemoglobin A1c goal below 6.5% and lipids with LDL cholesterol goal below 70 mg/dL. I also advised the patient to eat a healthy diet with plenty of whole grains, cereals, fruits and vegetables, exercise regularly and maintain ideal body weight .check diagnostic cerebral catheter angiogram to identify possible endovascular the treatable lesion. I also advised the patient about fall and safety precautions. She was advised to use a cane at all times.and not to drive due to her visual field loss Followup in the future with me in 3 months or call earlier if necessaryGreater than 50% of time during this 25 minute visit was spent on counseling,explanation of diagnosis, planning of further management, discussion with patient and family and coordination of care Antony Contras, MD  [facility] [address] [place], [place] [address]  Phone [phone] Fax [phone] Note: This document was prepared with digital dictation and possible smart phrase technology. Any transcriptional errors that result from this process are unintentional

## 2016-03-29 NOTE — Patient Instructions (Signed)
I had a long d/w patient and her son about her recent embolic stroke, severe asymptomatic carotid stenosis risk for recurrent stroke/TIAs, personally independently reviewed imaging studies and stroke evaluation results and answered questions.Continue aspirin 325 mg daily and Plavix 75 mg for secondary stroke prevention and maintain strict control of hypertension with blood pressure goal below 130/90, diabetes with hemoglobin A1c goal below 6.5% and lipids with LDL cholesterol goal below 70 mg/dL. I also advised the patient to eat a healthy diet with plenty of whole grains, cereals, fruits and vegetables, exercise regularly and maintain ideal body weight .check diagnostic cerebral catheter angiogram to identify possible endovascular the treatable lesion. I also advised the patient about fall and safety precautions. She was advised to use a cane at all times.and not to drive due to her visual field loss Followup in the future with me in 3 months or call earlier if necessary Stroke Prevention Some medical conditions and behaviors are associated with an increased chance of having a stroke. You may prevent a stroke by making healthy choices and managing medical conditions. HOW CAN I REDUCE MY RISK OF HAVING A STROKE?   Stay physically active. Get at least 30 minutes of activity on most or all days.  Do not smoke. It may also be helpful to avoid exposure to secondhand smoke.  Limit alcohol use. Moderate alcohol use is considered to be:  No more than 2 drinks per day for men.  No more than 1 drink per day for nonpregnant women.  Eat healthy foods. This involves:  Eating 5 or more servings of fruits and vegetables a day.  Making dietary changes that address high blood pressure (hypertension), high cholesterol, diabetes, or obesity.  Manage your cholesterol levels.  Making food choices that are high in fiber and low in saturated fat, trans fat, and cholesterol may control cholesterol levels.  Take  any prescribed medicines to control cholesterol as directed by your health care provider.  Manage your diabetes.  Controlling your carbohydrate and sugar intake is recommended to manage diabetes.  Take any prescribed medicines to control diabetes as directed by your health care provider.  Control your hypertension.  Making food choices that are low in salt (sodium), saturated fat, trans fat, and cholesterol is recommended to manage hypertension.  Ask your health care provider if you need treatment to lower your blood pressure. Take any prescribed medicines to control hypertension as directed by your health care provider.  If you are 6618-80 years of age, have your blood pressure checked every 3-5 years. If you are 80 years of age or older, have your blood pressure checked every year.  Maintain a healthy weight.  Reducing calorie intake and making food choices that are low in sodium, saturated fat, trans fat, and cholesterol are recommended to manage weight.  Stop drug abuse.  Avoid taking birth control pills.  Talk to your health care provider about the risks of taking birth control pills if you are over 80 years old, smoke, get migraines, or have ever had a blood clot.  Get evaluated for sleep disorders (sleep apnea).  Talk to your health care provider about getting a sleep evaluation if you snore a lot or have excessive sleepiness.  Take medicines only as directed by your health care provider.  For some people, aspirin or blood thinners (anticoagulants) are helpful in reducing the risk of forming abnormal blood clots that can lead to stroke. If you have the irregular heart rhythm of atrial fibrillation, you should  be on a blood thinner unless there is a good reason you cannot take them.  Understand all your medicine instructions.  Make sure that other conditions (such as anemia or atherosclerosis) are addressed. SEEK IMMEDIATE MEDICAL CARE IF:   You have sudden weakness or  numbness of the face, arm, or leg, especially on one side of the body.  Your face or eyelid droops to one side.  You have sudden confusion.  You have trouble speaking (aphasia) or understanding.  You have sudden trouble seeing in one or both eyes.  You have sudden trouble walking.  You have dizziness.  You have a loss of balance or coordination.  You have a sudden, severe headache with no known cause.  You have new chest pain or an irregular heartbeat. Any of these symptoms may represent a serious problem that is an emergency. Do not wait to see if the symptoms will go away. Get medical help at once. Call your local emergency services (911 in [place] Do not drive yourself to the hospital.   This information is not intended to replace advice given to you by your health care provider. Make sure you discuss any questions you have with your health care provider.   Document Released: 09/05/2004 Document Revised: 08/19/2014 Document Reviewed: 01/29/2013 Elsevier Interactive Patient Education [employer]

## 2016-04-04 ENCOUNTER — Other Ambulatory Visit: Payer: Self-pay | Admitting: Radiology

## 2016-04-05 ENCOUNTER — Encounter (HOSPITAL_COMMUNITY): Payer: Self-pay

## 2016-04-05 ENCOUNTER — Ambulatory Visit (HOSPITAL_COMMUNITY)
Admission: RE | Admit: 2016-04-05 | Discharge: 2016-04-05 | Disposition: A | Discharge status: Home / Self Care | Payer: Medicare Other | Source: Ambulatory Visit | Attending: Neurology | Admitting: Neurology

## 2016-04-05 DIAGNOSIS — I6522 Occlusion and stenosis of left carotid artery: Secondary | ICD-10-CM | POA: Diagnosis not present

## 2016-04-05 DIAGNOSIS — Z79899 Other long term (current) drug therapy: Secondary | ICD-10-CM | POA: Insufficient documentation

## 2016-04-05 DIAGNOSIS — Z5309 Procedure and treatment not carried out because of other contraindication: Secondary | ICD-10-CM | POA: Insufficient documentation

## 2016-04-05 LAB — CBC
HEMATOCRIT: 39 % (ref 36.0–46.0)
HEMOGLOBIN: 12.5 g/dL (ref 12.0–15.0)
MCH: 25.8 pg — AB (ref 26.0–34.0)
MCHC: 32.1 g/dL (ref 30.0–36.0)
MCV: 80.4 fL (ref 78.0–100.0)
Platelets: 250 10*3/uL (ref 150–400)
RBC: 4.85 MIL/uL (ref 3.87–5.11)
RDW: 14.7 % (ref 11.5–15.5)
WBC: 7.4 10*3/uL (ref 4.0–10.5)

## 2016-04-05 LAB — APTT: aPTT: 27 seconds (ref 24–36)

## 2016-04-05 LAB — BASIC METABOLIC PANEL
ANION GAP: 6 (ref 5–15)
BUN: 21 mg/dL — ABNORMAL HIGH (ref 6–20)
CHLORIDE: 109 mmol/L (ref 101–111)
CO2: 27 mmol/L (ref 22–32)
Calcium: 9.8 mg/dL (ref 8.9–10.3)
Creatinine, Ser: 1.42 mg/dL — ABNORMAL HIGH (ref 0.44–1.00)
GFR calc non Af Amer: 34 mL/min — ABNORMAL LOW (ref >60)
GFR, EST AFRICAN AMERICAN: 40 mL/min — AB (ref >60)
GLUCOSE: 102 mg/dL — AB (ref 65–99)
POTASSIUM: 3.9 mmol/L (ref 3.5–5.1)
Sodium: 142 mmol/L (ref 135–145)

## 2016-04-05 LAB — PROTIME-INR
INR: 1.13
Prothrombin Time: 14.5 seconds (ref 11.4–15.2)

## 2016-04-05 LAB — GLUCOSE, CAPILLARY: Glucose-Capillary: 107 mg/dL — ABNORMAL HIGH (ref 65–99)

## 2016-04-05 MED ORDER — SODIUM CHLORIDE 0.9 % IV SOLN
Freq: Once | INTRAVENOUS | Status: AC
  Administered 2016-04-05: 10:00:00 via INTRAVENOUS

## 2016-04-05 NOTE — Progress Notes (Signed)
Patient ID: [name] [name], female   DOB: [birth year], 82 y.o.   MRN: [id number] Procedure cancelled for today as she has a new elevation in her creatinine from a baseline between 0.7-0.9 with a GFR in the 50-60s.  Her Creatinine today is 1.42 with a GFR of 34.  I have d/w Dr. Corliss Skains and we feel it is safest to postpone today and plan to reschedule her with a 2 day preprocedure hydration and then IVFs upon arrival the day of the procedure.  If her creatinine is normal, we will proceed.  If it remains abnormal, then further work up will likely be needed.  [name] [name] E 10:33 AM 04/05/2016

## 2016-04-08 ENCOUNTER — Ambulatory Visit: Payer: Medicare Other | Admitting: Occupational Therapy

## 2016-04-08 ENCOUNTER — Encounter: Payer: Self-pay | Admitting: Occupational Therapy

## 2016-04-08 ENCOUNTER — Ambulatory Visit: Payer: Medicare Other | Attending: Internal Medicine | Admitting: Physical Therapy

## 2016-04-08 ENCOUNTER — Encounter: Payer: Self-pay | Admitting: Physical Therapy

## 2016-04-08 DIAGNOSIS — R29818 Other symptoms and signs involving the nervous system: Secondary | ICD-10-CM | POA: Insufficient documentation

## 2016-04-08 DIAGNOSIS — R41844 Frontal lobe and executive function deficit: Secondary | ICD-10-CM

## 2016-04-08 DIAGNOSIS — R2689 Other abnormalities of gait and mobility: Secondary | ICD-10-CM | POA: Insufficient documentation

## 2016-04-08 DIAGNOSIS — M6281 Muscle weakness (generalized): Secondary | ICD-10-CM | POA: Insufficient documentation

## 2016-04-08 DIAGNOSIS — R2681 Unsteadiness on feet: Secondary | ICD-10-CM | POA: Insufficient documentation

## 2016-04-08 DIAGNOSIS — R208 Other disturbances of skin sensation: Secondary | ICD-10-CM | POA: Insufficient documentation

## 2016-04-08 DIAGNOSIS — I69954 Hemiplegia and hemiparesis following unspecified cerebrovascular disease affecting left non-dominant side: Secondary | ICD-10-CM | POA: Diagnosis not present

## 2016-04-08 DIAGNOSIS — R41842 Visuospatial deficit: Secondary | ICD-10-CM | POA: Insufficient documentation

## 2016-04-08 NOTE — Therapy (Signed)
[facility] [address] [place], [place], [address] Phone: [phone]   Fax:  [phone]  Occupational Therapy Evaluation  Patient Details  Name: [name] [name] MRN: [id number] Date of Birth: 12/16/1933 Referring Provider: Dr. Sanda Lingerhomas [name]  Encounter Date: 04/08/2016      OT End of Session - 04/08/16 1517    Visit Number 1   Number of Visits 16   Date for OT Re-Evaluation 06/03/16   Authorization Type medicare- will need g code and progress note every 10th visit   Authorization Time Period 60 days   Authorization - Visit Number 1   Authorization - Number of Visits 10   OT Start Time 1320   OT Stop Time 1359   OT Time Calculation (min) 39 min   Activity Tolerance Patient tolerated treatment well      Past Medical History:  Diagnosis Date  . Chronic combined systolic and diastolic CHF, NYHA class 3 (HCC)    a. 03/2013 Echo: EF 25-30%, diff HK, sev antsept HK, mild MR.  . Congestive dilated cardiomyopathy (HCC)    a. 03/2013 Echo: EF 25-30%;  b. 09/2013 s/p SJM 3242 CRT-P.  Marland Kitchen. Coronary artery disease    a. 08/2012 Cath: LM nl, LAD 3675m, LCX nondom, mild-mod nonobs dzs mid, OM1 ok, OM2 90/90p, RCA 40, EF 25-30%-->Med Rx.  . GERD (gastroesophageal reflux disease)   . High cholesterol   . Hypertension   . Hypothyroidism   . Left bundle branch block   . Osteoarthritis   . Stroke (HCC)   . Type II diabetes mellitus (HCC)     Past Surgical History:  Procedure Laterality Date  . ABDOMINAL HYSTERECTOMY  1980  . BI-VENTRICULAR PACEMAKER INSERTION N/A 10/01/2013   Procedure: BI-VENTRICULAR PACEMAKER INSERTION (CRT-P);  Surgeon: Duke SalviaSteven C Klein, MD;  [facility];  Service: Cardiovascular;  Laterality: N/A;  . BI-VENTRICULAR PACEMAKER INSERTION (CRT-P)     a. 09/2013 s/p SJM 3242 CRT-P.  . Bilateral cateract surgery    . BMD  2006  . ORIF TIBIA PLATEAU Right 12/04/2013   Procedure: OPEN REDUCTION INTERNAL  FIXATION (ORIF) TIBIAL PLATEAU;  Surgeon: Verlee RossettiSteven R Norris, MD;  Location: [facility] ORS;  Service: Orthopedics;  Laterality: Right;  . RIGHT HEART CATHETERIZATION N/A 09/08/2012   Procedure: RIGHT HEART CATH;  Surgeon: Dolores Pattyaniel R Bensimhon, MD;  [facility];  Service: Cardiovascular;  Laterality: N/A;    There were no vitals filed for this visit.      Subjective Assessment - 04/08/16 1326    Subjective  I had a stroke   Patient is accompained by: Family member  son Selena BattenKim   Pertinent History see epic snapshot, CVA in 12/2015, diabetic neuropathy   Patient Stated Goals I want better energy   Currently in Pain? No/denies  sometimes lower back pain but not today           [facility] OT Assessment - 04/08/16 0001      Assessment   Diagnosis CVA; diabetic neuropathy   Referring Provider Dr. Sanda Lingerhomas [name]   Onset Date --  12/2015   Prior Therapy HHOT, PT and ST as well as nursing     Precautions   Precautions Fall     Restrictions   Weight Bearing Restrictions No     Balance Screen   Has the patient fallen in the past 6 months Yes   How many times? 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   Leisure [employer], shoppng at [employer], time with family     ADL   Eating/Feeding Independent   Grooming Independent   Upper Body Bathing Supervision/safety   Lower Body Bathing Supervision/safety   Upper Body Dressing Increased time   Lower Body Dressing Increased time    Toilet Tranfer Modified independent   Toileting - Conservator, museum/gallery -  Hygiene Independent   Tub/Shower Transfer Supervision/safety   ADL comments Son reports he stays in bathroom to remind pt not to stand in shower     IADL   Shopping Assistance for transportation   Light Housekeeping Performs light daily tasks such as dishwashing, bed making   Meal Prep Able to complete simple cold meal and snack prep;Able to complete simple warm meal prep  son assists with heavy lifting in kitchen   Community Mobility Relies on family or friends for transportation   Medication Management Is not capable of dispensing or managing own medication  son manages all aspects   Financial Management Requires assistance     Mobility   Mobility Status History of falls     Written Expression   Dominant Hand Right   Handwriting 100% legible  for name in cursive;pt has premorbid tremor     Vision - History   Baseline Vision Wears glasses only for reading   Additional Comments Pt's son reports L neglect. (both body and space)     Vision Assessment   Eye Alignment Within Functional Limits   Ocular Range of Motion Within Functional Limits   Tracking/Visual Pursuits Other (comment)  difficulty maintaining objects in L visual fields   Depth Perception Impaired - pt over or undershoots objects.    Comment Pt initially with diplopia but reports that this is resolved.       Activity Tolerance   Activity Tolerance Tolerates 30 min activity with muliple rests     Cognition   Overall Cognitive Status Impaired/Different from baseline   Attention Sustained   Sustained Attention Impaired   Memory Impaired   Awareness Impaired   Problem Solving Impaired   Executive Function --  all impaired     Sensation   Light Touch Appears Intact  some decrease localization   Hot/Cold Appears Intact   Proprioception Appears Intact   Additional Comments kinesthetic sense impaired      Coordination   Gross Motor Movements are Fluid and Coordinated Yes  pt needs cues to attend to LUE    Fine Motor Movements are Fluid and Coordinated No  pt has premorbid tremor in RUE   Other Pt over and undershoots targets from what appears to be depth perception issues.     Perception   Perception Impaired   Inattention/Neglect Does not attend to left visual field     Praxis   Praxis --  to be further assessed via functional tasks     Tone   Assessment Location Left Upper Extremity  ROM / Strength   AROM / PROM / Strength AROM;Strength     AROM   Overall AROM  Within functional limits for tasks performed     Strength   Overall Strength Within functional limits for tasks performed     Hand Function   Right Hand Gross Grasp Functional   Right Hand Grip (lbs) 40   Left Hand Gross Grasp Impaired   Left Hand Grip (lbs) 30     LUE Tone   LUE Tone Within Functional Limits                           OT Short Term Goals - 04/08/16 1507      OT SHORT TERM GOAL #1   Title Pt and family will be mod I with home activities program - 05/06/2016   Status New     OT SHORT TERM GOAL #2   Title Pt will need  min vc's for table top scanning activities with functional task    Status New     OT SHORT TERM GOAL #3   Title Pt will be supervision for simple hot meal prep making familiar hot meal   Status New     OT SHORT TERM GOAL #4   Title Pt will increase grip strength by at least 3 pounds to assist in functional tasks (baseline = 30 pounds)   Status New     OT SHORT TERM GOAL #5   Title Pt will be mod I with bathing at shower level   Status New           OT Long Term Goals - 04/08/16 1509      OT LONG TERM GOAL #1   Title Pt and family will be mod I with home activities program - 06/03/2016   Status New     OT LONG TERM GOAL #2   Title Pt will be min vc's for environmental scanning with familiar functional tasks   Status New     OT LONG TERM  GOAL #3   Title Pt will increase grip strength in LUE by 5 pounds to assist with functioal activities (baseline= 30)   Status New     OT LONG TERM GOAL #4   Title Pt will tolerate 25 minutes of ambulatory activity with no more than 1 rest break   Status New     OT LONG TERM GOAL #5   Title Pt will be mod I with shower transfers   Status New               Plan - 04/08/16 1511    Clinical Impression Statement Pt is a 82 year old female s/p R CVA in 12/2015. PMH: HLD, OA, tremor, cardiomyopathy, HTN, chronic systolic heart failure, CAD, IDDM, diabetic neuropathy. Pt presents today with the followng that impact her ability to complete basic ADL's, IADL's, and leisure activities:  mild L non dominant hemiplegia, L neglect, impaired depth perception, impaired cognition including ST memory, attention, insight, judgement, safety and problem solving, decreased balance, decreased activity tolerance, impaired sensation.  Pt will benefit from skilled OT to address these deficits to maximize independence.    Rehab Potential Good   OT Frequency 2x / week   OT Duration 8 weeks   OT Treatment/Interventions Self-care/ADL training;Moist Heat;DME and/or AE instruction;Energy conservation;Neuromuscular education;Therapeutic exercise;Building services engineer;Therapeutic activities;Balance training;Patient/family education;Visual/perceptual remediation/compensation;Cognitive remediation/compensation   Plan initiate HEP for grip strength, balance, activity tolerance, L  neglect   Recommended Other Services ST eval - requested order   Consulted and Agree with Plan of Care Patient;Family member/caregiver   Family Member Consulted son Selena Batten      Patient will benefit from skilled therapeutic intervention in order to improve the following deficits and impairments:  Cardiopulmonary status limiting activity, Decreased activity tolerance, Decreased balance, Decreased cognition, Decreased knowledge of use of DME,  Decreased mobility, Decreased safety awareness, Difficulty walking, Decreased strength, Impaired UE functional use, Impaired sensation, Impaired vision/preception  Visit Diagnosis: Hemiplegia and hemiparesis following unspecified cerebrovascular disease affecting left non-dominant side (HCC) - Plan: Ot plan of care cert/re-cert  Visuospatial deficit - Plan: Ot plan of care cert/re-cert  Frontal lobe and executive function deficit - Plan: Ot plan of care cert/re-cert  Unsteadiness on feet - Plan: Ot plan of care cert/re-cert  Other disturbances of skin sensation - Plan: Ot plan of care cert/re-cert      G-Codes - Apr 30, 2016 1520    Functional Assessment Tool Used skilled clinical observation   Functional Limitation Self care   Self Care Current Status [phone]) At least 60 percent but less than 80 percent impaired, limited or restricted   Self Care Goal Status (U9811) At least 20 percent but less than 40 percent impaired, limited or restricted      Problem List Patient Active Problem List   Diagnosis Date Noted  . Left carotid artery stenosis   . Hypertriglyceridemia, essential 01/10/2016  . 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 Type 2 diabetes mellitus with diabetic neuropathy, with long-term current use of insulin (HCC) 10/06/2015  . Unspecified deficiency anemia 03/10/2014  . Osteoporosis, unspecified 03/10/2014  . Left bundle branch block 07/01/2013  . Coronary atherosclerosis of native coronary artery 02/16/2013  . Chronic systolic heart failure (HCC) 08/16/2012  . Congestive dilated cardiomyopathy (HCC) 08/02/2012  . HTN (hypertension) 08/02/2012  . Familial tremor 11/29/2010  . VITAMIN D DEFICIENCY 04/04/2010  . Osteoarthrosis, unspecified whether generalized or localized, unspecified site 04/04/2010  . Hyperlipidemia with target LDL less than 70 01/19/2007  . Hypothyroidism 01/14/2007    Norton Pastel,  OTR/L 2016-04-30, 3:23 PM  [facility] [address] [place], [place], [address] Phone: [phone]   Fax:  [phone]  Name: [name] [name] MRN: [id number] Date of Birth: October 01, 1933

## 2016-04-09 ENCOUNTER — Other Ambulatory Visit: Payer: Self-pay | Admitting: *Deleted

## 2016-04-09 MED ORDER — CLOPIDOGREL BISULFATE 75 MG PO TABS: 75.0000 mg | ORAL_TABLET | Freq: Every day | ORAL | 2 refills | Status: DC

## 2016-04-09 MED ORDER — ROSUVASTATIN CALCIUM 20 MG PO TABS: 20.0000 mg | ORAL_TABLET | Freq: Every day | ORAL | 2 refills | Status: DC

## 2016-04-09 NOTE — Therapy (Signed)
less than 60 percent impaired, limited or restricted       Problem List Patient Active Problem List   Diagnosis Date Noted  . Left carotid artery stenosis   . Hypertriglyceridemia, essential 01/10/2016  . 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 Type 2 diabetes mellitus with diabetic neuropathy, with long-term current use of insulin (HCC) 10/06/2015  . Unspecified deficiency anemia 03/10/2014  . Osteoporosis, unspecified 03/10/2014  . Left bundle branch block 07/01/2013  . Coronary atherosclerosis of native coronary artery 02/16/2013  . Chronic systolic heart failure (HCC) 08/16/2012  . Congestive dilated cardiomyopathy (HCC) 08/02/2012  . HTN (hypertension) 08/02/2012  . Familial tremor 11/29/2010  . VITAMIN D DEFICIENCY 04/04/2010  . Osteoarthrosis,  unspecified whether generalized or localized, unspecified site 04/04/2010  . Hyperlipidemia with target LDL less than 70 01/19/2007  . Hypothyroidism 01/14/2007    [name] [name] PT, DPT 04/09/2016, 12:07 PM  [facility] [address] [place], [place], [address] Phone: [phone]   Fax:  [phone]  Name: [name] [name] MRN: [id number] Date of Birth: 07/13/1934  [facility] [address] [place], [place], [address] Phone: [phone]   Fax:  [phone]  Physical Therapy Evaluation  Patient Details  Name: [name] [name] MRN: [id number] Date of Birth: 02/29/1934 Referring Provider: Sanda Linger, MD PCP (Dr. Pearlean Brownie, neurologist)  Encounter Date: 04/08/2016      PT End of Session - 04/08/16 1445    Visit Number 1   Number of Visits 18   Date for PT Re-Evaluation 05/31/16   Authorization Type Medicare G-code, TRICARE   PT Start Time 1400   PT Stop Time 1444   PT Time Calculation (min) 44 min   Equipment Utilized During Treatment Gait belt   Activity Tolerance Patient tolerated treatment well   Behavior During Therapy [facility] for tasks assessed/performed      Past Medical History:  Diagnosis Date  . Chronic combined systolic and diastolic CHF, NYHA class 3 (HCC)    a. 03/2013 Echo: EF 25-30%, diff HK, sev antsept HK, mild MR.  . Congestive dilated cardiomyopathy (HCC)    a. 03/2013 Echo: EF 25-30%;  b. 09/2013 s/p SJM 3242 CRT-P.  Marland Kitchen Coronary artery disease    a. 08/2012 Cath: LM nl, LAD 3m, LCX nondom, mild-mod nonobs dzs mid, OM1 ok, OM2 90/90p, RCA 40, EF 25-30%-->Med Rx.  . GERD (gastroesophageal reflux disease)   . High cholesterol   . Hypertension   . Hypothyroidism   . Left bundle branch block   . Osteoarthritis   . Stroke (HCC)   . Type II diabetes mellitus (HCC)     Past Surgical History:  Procedure Laterality Date  . ABDOMINAL HYSTERECTOMY  1980  . BI-VENTRICULAR PACEMAKER INSERTION N/A 10/01/2013   Procedure: BI-VENTRICULAR PACEMAKER INSERTION (CRT-P);  Surgeon: Duke Salvia, MD;  Location: [facility] CATH LAB;  Service: Cardiovascular;  Laterality: N/A;  . BI-VENTRICULAR PACEMAKER INSERTION (CRT-P)     a. 09/2013 s/p SJM 3242 CRT-P.  . Bilateral cateract surgery    . BMD  2006  . ORIF TIBIA PLATEAU Right 12/04/2013   Procedure: OPEN REDUCTION INTERNAL FIXATION (ORIF)  TIBIAL PLATEAU;  Surgeon: Verlee Rossetti, MD;  Location: [facility] ORS;  Service: Orthopedics;  Laterality: Right;  . RIGHT HEART CATHETERIZATION N/A 09/08/2012   Procedure: RIGHT HEART CATH;  Surgeon: Dolores Patty, MD;  Location: [facility] CATH LAB;  Service: Cardiovascular;  Laterality: N/A;    There were no vitals filed for this visit.       Subjective Assessment - 04/08/16 1400    Subjective This 82yo female was referred by PCP but is under the care of neurologist, Dr. Pearlean Brownie. She is accompanied by her son who reports she was active in community including driving prior to CVA.   Patient is accompained by: Family member   Limitations Standing;Walking;Lifting   Patient Stated Goals To walk and balance better. Build stamina.    Currently in Pain? No/denies            [facility] PT Assessment - 04/08/16 1403      Assessment   Medical Diagnosis DM neuropathy, CVA   Referring Provider Sanda Linger, MD PCP  Dr. Pearlean Brownie, neurologist   Onset Date/Surgical Date 01/10/16  CVA   Hand Dominance Right     Precautions   Precautions Fall   Precaution Comments No driving     Balance Screen   Has the patient fallen in the past 6 months Yes   How many times? 2  missed calculating couch or chair  [facility] [address] [place], [place], [address] Phone: [phone]   Fax:  [phone]  Physical Therapy Evaluation  Patient Details  Name: [name] [name] MRN: [id number] Date of Birth: 02/29/1934 Referring Provider: Sanda Linger, MD PCP (Dr. Pearlean Brownie, neurologist)  Encounter Date: 04/08/2016      PT End of Session - 04/08/16 1445    Visit Number 1   Number of Visits 18   Date for PT Re-Evaluation 05/31/16   Authorization Type Medicare G-code, TRICARE   PT Start Time 1400   PT Stop Time 1444   PT Time Calculation (min) 44 min   Equipment Utilized During Treatment Gait belt   Activity Tolerance Patient tolerated treatment well   Behavior During Therapy [facility] for tasks assessed/performed      Past Medical History:  Diagnosis Date  . Chronic combined systolic and diastolic CHF, NYHA class 3 (HCC)    a. 03/2013 Echo: EF 25-30%, diff HK, sev antsept HK, mild MR.  . Congestive dilated cardiomyopathy (HCC)    a. 03/2013 Echo: EF 25-30%;  b. 09/2013 s/p SJM 3242 CRT-P.  Marland Kitchen Coronary artery disease    a. 08/2012 Cath: LM nl, LAD 3m, LCX nondom, mild-mod nonobs dzs mid, OM1 ok, OM2 90/90p, RCA 40, EF 25-30%-->Med Rx.  . GERD (gastroesophageal reflux disease)   . High cholesterol   . Hypertension   . Hypothyroidism   . Left bundle branch block   . Osteoarthritis   . Stroke (HCC)   . Type II diabetes mellitus (HCC)     Past Surgical History:  Procedure Laterality Date  . ABDOMINAL HYSTERECTOMY  1980  . BI-VENTRICULAR PACEMAKER INSERTION N/A 10/01/2013   Procedure: BI-VENTRICULAR PACEMAKER INSERTION (CRT-P);  Surgeon: Duke Salvia, MD;  Location: [facility] CATH LAB;  Service: Cardiovascular;  Laterality: N/A;  . BI-VENTRICULAR PACEMAKER INSERTION (CRT-P)     a. 09/2013 s/p SJM 3242 CRT-P.  . Bilateral cateract surgery    . BMD  2006  . ORIF TIBIA PLATEAU Right 12/04/2013   Procedure: OPEN REDUCTION INTERNAL FIXATION (ORIF)  TIBIAL PLATEAU;  Surgeon: Verlee Rossetti, MD;  Location: [facility]S;  Service: Orthopedics;  Laterality: Right;  . RIGHT HEART CATHETERIZATION N/A 09/08/2012   Procedure: RIGHT HEART CATH;  Surgeon: Dolores Patty, MD;  Location: [facility] CATH LAB;  Service: Cardiovascular;  Laterality: N/A;    There were no vitals filed for this visit.       Subjective Assessment - 04/08/16 1400    Subjective This 82yo female was referred by PCP but is under the care of neurologist, Dr. Pearlean Brownie. She is accompanied by her son who reports she was active in community including driving prior to CVA.   Patient is accompained by: Family member   Limitations Standing;Walking;Lifting   Patient Stated Goals To walk and balance better. Build stamina.    Currently in Pain? No/denies            [facility] PT Assessment - 04/08/16 1403      Assessment   Medical Diagnosis DM neuropathy, CVA   Referring Provider Sanda Linger, MD PCP  Dr. Pearlean Brownie, neurologist   Onset Date/Surgical Date 01/10/16  CVA   Hand Dominance Right     Precautions   Precautions Fall   Precaution Comments No driving     Balance Screen   Has the patient fallen in the past 6 months Yes   How many times? 2  missed calculating couch or chair  [facility] [address] [place], [place], [address] Phone: [phone]   Fax:  [phone]  Physical Therapy Evaluation  Patient Details  Name: [name] [name] MRN: [id number] Date of Birth: 02/29/1934 Referring Provider: Sanda Linger, MD PCP (Dr. Pearlean Brownie, neurologist)  Encounter Date: 04/08/2016      PT End of Session - 04/08/16 1445    Visit Number 1   Number of Visits 18   Date for PT Re-Evaluation 05/31/16   Authorization Type Medicare G-code, TRICARE   PT Start Time 1400   PT Stop Time 1444   PT Time Calculation (min) 44 min   Equipment Utilized During Treatment Gait belt   Activity Tolerance Patient tolerated treatment well   Behavior During Therapy [facility] for tasks assessed/performed      Past Medical History:  Diagnosis Date  . Chronic combined systolic and diastolic CHF, NYHA class 3 (HCC)    a. 03/2013 Echo: EF 25-30%, diff HK, sev antsept HK, mild MR.  . Congestive dilated cardiomyopathy (HCC)    a. 03/2013 Echo: EF 25-30%;  b. 09/2013 s/p SJM 3242 CRT-P.  Marland Kitchen Coronary artery disease    a. 08/2012 Cath: LM nl, LAD 3m, LCX nondom, mild-mod nonobs dzs mid, OM1 ok, OM2 90/90p, RCA 40, EF 25-30%-->Med Rx.  . GERD (gastroesophageal reflux disease)   . High cholesterol   . Hypertension   . Hypothyroidism   . Left bundle branch block   . Osteoarthritis   . Stroke (HCC)   . Type II diabetes mellitus (HCC)     Past Surgical History:  Procedure Laterality Date  . ABDOMINAL HYSTERECTOMY  1980  . BI-VENTRICULAR PACEMAKER INSERTION N/A 10/01/2013   Procedure: BI-VENTRICULAR PACEMAKER INSERTION (CRT-P);  Surgeon: Duke Salvia, MD;  Location: [facility] CATH LAB;  Service: Cardiovascular;  Laterality: N/A;  . BI-VENTRICULAR PACEMAKER INSERTION (CRT-P)     a. 09/2013 s/p SJM 3242 CRT-P.  . Bilateral cateract surgery    . BMD  2006  . ORIF TIBIA PLATEAU Right 12/04/2013   Procedure: OPEN REDUCTION INTERNAL FIXATION (ORIF)  TIBIAL PLATEAU;  Surgeon: Verlee Rossetti, MD;  Location: [facility];  Service: Orthopedics;  Laterality: Right;  . RIGHT HEART CATHETERIZATION N/A 09/08/2012   Procedure: RIGHT HEART CATH;  Surgeon: Dolores Patty, MD;  Location: [facility] CATH LAB;  Service: Cardiovascular;  Laterality: N/A;    There were no vitals filed for this visit.       Subjective Assessment - 04/08/16 1400    Subjective This 82yo female was referred by PCP but is under the care of neurologist, Dr. Pearlean Brownie. She is accompanied by her son who reports she was active in community including driving prior to CVA.   Patient is accompained by: Family member   Limitations Standing;Walking;Lifting   Patient Stated Goals To walk and balance better. Build stamina.    Currently in Pain? No/denies            [facility] PT Assessment - 04/08/16 1403      Assessment   Medical Diagnosis DM neuropathy, CVA   Referring Provider Sanda Linger, MD PCP  Dr. Pearlean Brownie, neurologist   Onset Date/Surgical Date 01/10/16  CVA   Hand Dominance Right     Precautions   Precautions Fall   Precaution Comments No driving     Balance Screen   Has the patient fallen in the past 6 months Yes   How many times? 2  missed calculating couch or chair  [facility] [address] [place], [place], [address] Phone: [phone]   Fax:  [phone]  Physical Therapy Evaluation  Patient Details  Name: [name] [name] MRN: [id number] Date of Birth: 02/29/1934 Referring Provider: Sanda Linger, MD PCP (Dr. Pearlean Brownie, neurologist)  Encounter Date: 04/08/2016      PT End of Session - 04/08/16 1445    Visit Number 1   Number of Visits 18   Date for PT Re-Evaluation 05/31/16   Authorization Type Medicare G-code, TRICARE   PT Start Time 1400   PT Stop Time 1444   PT Time Calculation (min) 44 min   Equipment Utilized During Treatment Gait belt   Activity Tolerance Patient tolerated treatment well   Behavior During Therapy [facility] for tasks assessed/performed      Past Medical History:  Diagnosis Date  . Chronic combined systolic and diastolic CHF, NYHA class 3 (HCC)    a. 03/2013 Echo: EF 25-30%, diff HK, sev antsept HK, mild MR.  . Congestive dilated cardiomyopathy (HCC)    a. 03/2013 Echo: EF 25-30%;  b. 09/2013 s/p SJM 3242 CRT-P.  Marland Kitchen Coronary artery disease    a. 08/2012 Cath: LM nl, LAD 3m, LCX nondom, mild-mod nonobs dzs mid, OM1 ok, OM2 90/90p, RCA 40, EF 25-30%-->Med Rx.  . GERD (gastroesophageal reflux disease)   . High cholesterol   . Hypertension   . Hypothyroidism   . Left bundle branch block   . Osteoarthritis   . Stroke (HCC)   . Type II diabetes mellitus (HCC)     Past Surgical History:  Procedure Laterality Date  . ABDOMINAL HYSTERECTOMY  1980  . BI-VENTRICULAR PACEMAKER INSERTION N/A 10/01/2013   Procedure: BI-VENTRICULAR PACEMAKER INSERTION (CRT-P);  Surgeon: Duke Salvia, MD;  Location: [facility] CATH LAB;  Service: Cardiovascular;  Laterality: N/A;  . BI-VENTRICULAR PACEMAKER INSERTION (CRT-P)     a. 09/2013 s/p SJM 3242 CRT-P.  . Bilateral cateract surgery    . BMD  2006  . ORIF TIBIA PLATEAU Right 12/04/2013   Procedure: OPEN REDUCTION INTERNAL FIXATION (ORIF)  TIBIAL PLATEAU;  Surgeon: Verlee Rossetti, MD;  Location: [facility];  Service: Orthopedics;  Laterality: Right;  . RIGHT HEART CATHETERIZATION N/A 09/08/2012   Procedure: RIGHT HEART CATH;  Surgeon: Dolores Patty, MD;  Location: [facility] CATH LAB;  Service: Cardiovascular;  Laterality: N/A;    There were no vitals filed for this visit.       Subjective Assessment - 04/08/16 1400    Subjective This 82yo female was referred by PCP but is under the care of neurologist, Dr. Pearlean Brownie. She is accompanied by her son who reports she was active in community including driving prior to CVA.   Patient is accompained by: Family member   Limitations Standing;Walking;Lifting   Patient Stated Goals To walk and balance better. Build stamina.    Currently in Pain? No/denies            [facility] PT Assessment - 04/08/16 1403      Assessment   Medical Diagnosis DM neuropathy, CVA   Referring Provider Sanda Linger, MD PCP  Dr. Pearlean Brownie, neurologist   Onset Date/Surgical Date 01/10/16  CVA   Hand Dominance Right     Precautions   Precautions Fall   Precaution Comments No driving     Balance Screen   Has the patient fallen in the past 6 months Yes   How many times? 2  missed calculating couch or chair

## 2016-04-11 ENCOUNTER — Ambulatory Visit: Payer: Medicare Other | Admitting: Occupational Therapy

## 2016-04-11 ENCOUNTER — Ambulatory Visit: Payer: Medicare Other

## 2016-04-11 ENCOUNTER — Encounter: Payer: Self-pay | Admitting: Cardiology

## 2016-04-11 VITALS — BP 104/68 | HR 68

## 2016-04-11 DIAGNOSIS — R2689 Other abnormalities of gait and mobility: Secondary | ICD-10-CM | POA: Diagnosis not present

## 2016-04-11 DIAGNOSIS — I69954 Hemiplegia and hemiparesis following unspecified cerebrovascular disease affecting left non-dominant side: Secondary | ICD-10-CM | POA: Diagnosis not present

## 2016-04-11 DIAGNOSIS — R2681 Unsteadiness on feet: Secondary | ICD-10-CM

## 2016-04-11 DIAGNOSIS — M6281 Muscle weakness (generalized): Secondary | ICD-10-CM

## 2016-04-11 DIAGNOSIS — R208 Other disturbances of skin sensation: Secondary | ICD-10-CM

## 2016-04-11 DIAGNOSIS — R41842 Visuospatial deficit: Secondary | ICD-10-CM | POA: Diagnosis not present

## 2016-04-11 DIAGNOSIS — R29818 Other symptoms and signs involving the nervous system: Secondary | ICD-10-CM

## 2016-04-11 DIAGNOSIS — R41844 Frontal lobe and executive function deficit: Secondary | ICD-10-CM

## 2016-04-11 NOTE — Therapy (Signed)
[facility] [address] [place], [place], [address] Phone: [phone]   Fax:  [phone]  Physical Therapy Treatment  Patient Details  Name: [name] [name] MRN: [id number] Date of Birth: [birth year] Referring Provider: Sanda Linger, MD PCP (Dr. Pearlean Brownie, neurologist)  Encounter Date: 04/11/2016      PT End of Session - 04/11/16 1554    Visit Number 2   Number of Visits 18   Date for PT Re-Evaluation 05/31/16   Authorization Type Medicare G-code, TRICARE   PT Start Time 1402   PT Stop Time 1443   PT Time Calculation (min) 41 min   Equipment Utilized During Treatment --  min guard to min A prn   Activity Tolerance Patient tolerated treatment well   Behavior During Therapy Flat affect  pt with flat affect today, could have been fatigue      Past Medical History:  Diagnosis Date  . Chronic combined systolic and diastolic CHF, NYHA class 3 (HCC)    a. 03/2013 Echo: EF 25-30%, diff HK, sev antsept HK, mild MR.  . Congestive dilated cardiomyopathy (HCC)    a. 03/2013 Echo: EF 25-30%;  b. 09/2013 s/p SJM 3242 CRT-P.  Marland Kitchen Coronary artery disease    a. 08/2012 Cath: LM nl, LAD 25m, LCX nondom, mild-mod nonobs dzs mid, OM1 ok, OM2 90/90p, RCA 40, EF 25-30%-->Med Rx.  . GERD (gastroesophageal reflux disease)   . High cholesterol   . Hypertension   . Hypothyroidism   . Left bundle branch block   . Osteoarthritis   . Stroke (HCC)   . Type II diabetes mellitus (HCC)     Past Surgical History:  Procedure Laterality Date  . ABDOMINAL HYSTERECTOMY  1980  . BI-VENTRICULAR PACEMAKER INSERTION N/A 10/01/2013   Procedure: BI-VENTRICULAR PACEMAKER INSERTION (CRT-P);  Surgeon: Duke Salvia, MD;  Location: [facility] CATH LAB;  Service: Cardiovascular;  Laterality: N/A;  . BI-VENTRICULAR PACEMAKER INSERTION (CRT-P)     a. 09/2013 s/p SJM 3242 CRT-P.  . Bilateral cateract surgery    . BMD  2006  . ORIF TIBIA PLATEAU Right 12/04/2013   Procedure: OPEN REDUCTION INTERNAL FIXATION (ORIF) TIBIAL PLATEAU;  Surgeon: Verlee Rossetti, MD;  Location: [facility] ORS;  Service: Orthopedics;  Laterality: Right;  . RIGHT HEART CATHETERIZATION N/A 09/08/2012   Procedure: RIGHT HEART CATH;  Surgeon: Dolores Patty, MD;  Location: [facility] CATH LAB;  Service: Cardiovascular;  Laterality: N/A;    Vitals:   04/11/16 1423  BP: 104/68  Pulse: 68        Subjective Assessment - 04/11/16 1406    Subjective Pt denied falls since last visit. Pt amb. Into session with her [facility] and reports she uses SPC most of the time and RW rarely. Pt reported she's tired today but not sure why.   Patient Stated Goals To walk and balance better. Build stamina.    Currently in Pain? No/denies                         [facility] Adult PT Treatment/Exercise - 04/11/16 1408      Ambulation/Gait   Ambulation/Gait Yes   Ambulation/Gait Assistance 4: Min assist   Ambulation/Gait Assistance Details Cues to attend to L side, as pt veered towards R side and experienced inattention to L. Cues to stay within RW and to improve upright posture and stride length.   Ambulation Distance (Feet) 200 Feet  and 75'   Assistive device Straight  cane;Rolling walker   Gait Pattern Step-through pattern;Decreased arm swing - left;Decreased step length - right;Decreased stance time - left;Decreased stride length;Decreased hip/knee flexion - left;Left flexed knee in stance;Lateral hip instability;Trunk flexed;Poor foot clearance - left   Ambulation Surface Level;Indoor   Gait velocity 1.6ft/sec and 1.29ft/sec. with SPC and min A to avoid obstacles and 1.77ft/sec and 1.58ft/sec with RW.              Balance Exercises - 04/11/16 1424      OTAGO PROGRAM   Head Movements Sitting;5 reps   Neck Movements 5 reps  supine   Back Extension Standing;Other reps (comment)  10 reps with UE support on RW   Trunk Movements 5 reps;Standing  with UE support on RW   Ankle Movements  Sitting;10 reps   Knee Extensor 20 reps;Weight (comment)  x10 reps without band and x10 reps with red band   Knee Flexor 10 reps   Hip ABductor 10 reps   Ankle Plantorflexors 20 reps, support   Overall OTAGO Comments Cues and demonstration for technique and for equal weight placed through BLEs during standing activities. Incr. Time to allow demo and processing.           PT Education - 04/11/16 1553    Education provided Yes   Education Details PT discussed the importance of bringing RW next session and using RW at all times, as pt is at risk for falls. PT also discussed pt's inattention to L side and that PT and OT would continue to work with pt to improve L sided neglect. PT initiated OTAGO HEP.   Person(s) Educated Patient   Methods Explanation;Demonstration;Tactile cues;Verbal cues;Handout   Comprehension Verbalized understanding;Returned demonstration;Need further instruction          PT Short Term Goals - 04/11/16 1557      PT SHORT TERM GOAL #1   Title Patient with family cues demonstrates understanding of [place] HEP. (Target Date: 05/08/2016)   Time 1   Period Months   Status On-going     PT SHORT TERM GOAL #2   Title Timed Up-Go <15sec with supervision. (Target Date: 05/08/2016)   Time 1   Period Months   Status On-going     PT SHORT TERM GOAL #3   Title Standing balance turning head to look to side and reaches 5" & to floor with supervision. (Target Date: 05/08/2016)   Time 1   Period Months   Status On-going     PT SHORT TERM GOAL #4   Title Patient ambulates 250' around obstacles with LRAD with supervision. (Target Date: 05/08/2016)   Time 1   Period Months   Status On-going     PT SHORT TERM GOAL #5   Title Cognitive TUG <25sec with simple naming task.  (Target Date: 05/08/2016)   Time 1   Period Months   Status On-going           PT Long Term Goals - 04/11/16 1557      PT LONG TERM GOAL #1   Title Patient and family demonstrate & verbalize  understanding of ongoing fitness plan & HEP. (Target Date: 05/31/2016)   Time 2   Period Months   Status On-going     PT LONG TERM GOAL #2   Title Timed Up-Go standard <13.5sec safely without device. (Target Date: 05/31/2016)   Time 2   Period Months   Status On-going     PT LONG TERM GOAL #3   Title Berg Balance >  36/56 to indicate lower fall risk. (Target Date: 05/31/2016)   Time 2   Period Months   Status On-going     PT LONG TERM GOAL #4   Title Dynamic Gait Index >/= 10/24 to indicate lower fall risk. (Target Date: 05/31/2016)   Time 2   Period Months   Status On-going     PT LONG TERM GOAL #5   Title Patient ambulates 500' with LRAD with supervision for cognitive deficits no balance losses safely. (Target Date: 05/31/2016)   Time 2   Period Months   Status On-going     PT LONG TERM GOAL #6   Title Patient negotiates ramps, curbs & stairs (1 rail) with LRAD with supervision from family safely. (Target Date: 05/31/2016)   Time 2   Period Months   Status On-going     PT LONG TERM GOAL #7   Title Patient ambulates around furniture with LRAD modified independent for household mobility. (Target Date: 05/31/2016)   Time 2   Period Months   Status On-going               Plan - 04/11/16 1555    Clinical Impression Statement Pt's gait speed with SPC and RW indicate pt is at risk for falls. However, pt demonstrated improve balance with RW, as she required HHA during amb. with SPC. Pt continues to experience L sided neglect, especially during amb. and required cues to avoid obstacles. Continue with POC.    Rehab Potential Good   PT Frequency 2x / week   PT Duration Other (comment)  9 weeks (60 days)   PT Treatment/Interventions ADLs/Self Care Home Management;DME Instruction;Gait training;Stair training;Functional mobility training;Therapeutic activities;Therapeutic exercise;Balance training;Neuromuscular re-education;Patient/family education;Orthotic  Fit/Training;Vestibular   PT Next Visit Plan Finish OTAGO and provide to pt, and gait with RW (improve attention to L side)   Consulted and Agree with Plan of Care Patient   Family Member Consulted son      Patient will benefit from skilled therapeutic intervention in order to improve the following deficits and impairments:  Abnormal gait, Decreased activity tolerance, Decreased balance, Decreased coordination, Decreased endurance, Decreased mobility, Decreased safety awareness, Decreased strength, Impaired vision/preception, Postural dysfunction  Visit Diagnosis: Other abnormalities of gait and mobility  Other symptoms and signs involving the nervous system  Unsteadiness on feet  Hemiplegia and hemiparesis following unspecified cerebrovascular disease affecting left non-dominant side [facility])     Problem List Patient Active Problem List   Diagnosis Date Noted  . Left carotid artery stenosis   . Hypertriglyceridemia, essential 01/10/2016  . 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 Type 2 diabetes mellitus with diabetic neuropathy, with long-term current use of insulin (HCC) 10/06/2015  . Unspecified deficiency anemia 03/10/2014  . Osteoporosis, unspecified 03/10/2014  . Left bundle branch block 07/01/2013  . Coronary atherosclerosis of native coronary artery 02/16/2013  . Chronic systolic heart failure (HCC) 08/16/2012  . Congestive dilated cardiomyopathy (HCC) 08/02/2012  . HTN (hypertension) 08/02/2012  . Familial tremor 11/29/2010  . VITAMIN D DEFICIENCY 04/04/2010  . Osteoarthrosis, unspecified whether generalized or localized, unspecified site 04/04/2010  . Hyperlipidemia with target LDL less than 70 01/19/2007  . Hypothyroidism 01/14/2007    Rodd Heft L 04/11/2016, 3:59 PM  [facility] [address] [place], [place], [address] Phone: [phone]    Fax:  [phone]  Name: [name] [name] MRN: [id number] Date of Birth: 27-May-1934   Zerita Boers, PT,DPT 04/11/16 3:59  PM Phone: [phone] Fax: [phone]

## 2016-04-11 NOTE — Therapy (Signed)
Rehab Potential Good   OT Frequency 2x / week   OT Duration 8 weeks   OT Treatment/Interventions Self-care/ADL training;Moist Heat;DME and/or AE instruction;Energy conservation;Neuromuscular education;Therapeutic exercise;Building services engineerunctional Mobility Training;Therapeutic activities;Balance training;Patient/family education;Visual/perceptual remediation/compensation;Cognitive remediation/compensation   Plan Initiate HEP for grip strength, balance, activity tolerance, cooking   Consulted and Agree with Plan of Care Patient;Family member/caregiver   Family Member Consulted son Selena BattenKim      Patient will benefit from skilled therapeutic intervention in order to improve the following deficits and impairments:  Cardiopulmonary status limiting activity, Decreased activity tolerance, Decreased balance, Decreased cognition, Decreased knowledge of use of DME, Decreased mobility, Decreased safety awareness, Difficulty walking, Decreased strength, Impaired UE functional use, Impaired sensation, Impaired vision/preception  Visit Diagnosis: Hemiplegia and hemiparesis following unspecified cerebrovascular disease affecting left non-dominant side (HCC)  Visuospatial deficit  Frontal lobe and executive function deficit  Unsteadiness on feet  Other disturbances of skin sensation  Muscle weakness (generalized)  Other symptoms and signs involving the nervous system    Problem List Patient Active Problem List   Diagnosis Date Noted  . Left carotid artery stenosis   . Hypertriglyceridemia, essential 01/10/2016  . 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 Type 2 diabetes mellitus with diabetic neuropathy, with long-term current use of insulin (HCC) 10/06/2015  . Unspecified deficiency anemia 03/10/2014  . Osteoporosis,  unspecified 03/10/2014  . Left bundle branch block 07/01/2013  . Coronary atherosclerosis of native coronary artery 02/16/2013  . Chronic systolic heart failure (HCC) 08/16/2012  . Congestive dilated cardiomyopathy (HCC) 08/02/2012  . HTN (hypertension) 08/02/2012  . Familial tremor 11/29/2010  . VITAMIN D DEFICIENCY 04/04/2010  . Osteoarthrosis, unspecified whether generalized or localized, unspecified site 04/04/2010  . Hyperlipidemia with target LDL less than 70 01/19/2007  . Hypothyroidism 01/14/2007    Collier SalinaGellert, Tyshaun Vinzant M, OTR/L 04/11/2016, 5:00 PM  [facility] [address] [place], [place], [address] Phone: [phone]   Fax:  [phone]  Name: [name] [name] MRN: [id number] Date of Birth: 04/14/1934  [facility] [address] [place], [place], [address] Phone: [phone]   Fax:  [phone]  Occupational Therapy Treatment  Patient Details  Name: [name] [name] MRN: [id number] Date of Birth: 09/28/1933 Referring Provider: Dr. Sanda Linger  Encounter Date: 04/11/2016      OT End of Session - 04/11/16 1656    Visit Number 2   Number of Visits 16   Date for OT Re-Evaluation 06/03/16   Authorization Type medicare- will need g code and progress note every 10th visit   Authorization Time Period 60 days   Authorization - Visit Number 2   Authorization - Number of Visits 10   OT Start Time 1445   OT Stop Time 1530   OT Time Calculation (min) 45 min   Activity Tolerance Patient tolerated treatment well   Behavior During Therapy Flat affect      Past Medical History:  Diagnosis Date  . Chronic combined systolic and diastolic CHF, NYHA class 3 (HCC)    a. 03/2013 Echo: EF 25-30%, diff HK, sev antsept HK, mild MR.  . Congestive dilated cardiomyopathy (HCC)    a. 03/2013 Echo: EF 25-30%;  b. 09/2013 s/p SJM 3242 CRT-P.  Marland Kitchen Coronary artery disease    a. 08/2012 Cath: LM nl, LAD 29m, LCX nondom, mild-mod nonobs dzs mid, OM1 ok, OM2 90/90p, RCA 40, EF 25-30%-->Med Rx.  . GERD (gastroesophageal reflux disease)   . High cholesterol   . Hypertension   . Hypothyroidism   . Left bundle branch block   . Osteoarthritis   . Stroke (HCC)   . Type II diabetes mellitus (HCC)     Past Surgical History:  Procedure Laterality Date  . ABDOMINAL HYSTERECTOMY  1980  . BI-VENTRICULAR PACEMAKER INSERTION N/A 10/01/2013   Procedure: BI-VENTRICULAR PACEMAKER INSERTION (CRT-P);  Surgeon: Duke Salvia, MD;  Location[facility] C[facility] LAB;  Service: Cardiovascular;  Laterality: N/A;  . BI-VENTRICULAR PACEMAKER INSERTION (CRT-P)     a. 09/2013 s/p SJM 3242 CRT-P.  . Bilateral cateract surgery    . BMD  2006  . ORIF TIBIA PLATEAU Right 12/04/2013   Procedure: OPEN REDUCTION INTERNAL FIXATION (ORIF) TIBIAL PLATEAU;  Surgeon: Verlee Rossetti, MD;  Location: [facility];  Service: Orthopedics;  Laterality: Right;  . RIGHT HEART CATHETERIZATION N/A 09/08/2012   Procedure: RIGHT HEART CATH;  Surgeon: Dolores Patty, MD;  Location: [facility] CATH LAB;  Service: Cardiovascular;  Laterality: N/A;    There were no vitals filed for this visit.      Subjective Assessment - 04/11/16 1641    Subjective  "My son says my peripheral vision is slow, he thinks I have old timers"   Patient is accompained by: Family member   Pertinent History see epic snapshot, CVA in 12/2015, diabetic neuropathy   Patient Stated Goals I want better energy   Currently in Pain? No/denies                      OT Treatments/Exercises (OP) - 04/11/16 0001      Cognitive Exercises   Other Cognitive Exercises 1 Worked on familar simple sorting task - patient needed min cueing initially, and max assist once mentally fatigued.  Patient needed cueing to identify a process to sort, to identify errors.       Visual/Perceptual Exercises   Scanning Environmental;Tabletop   Scanning - Environmental Walking with patient in busy gym environment.  Patient lists to right when walking, and requires  Rehab Potential Good   OT Frequency 2x / week   OT Duration 8 weeks   OT Treatment/Interventions Self-care/ADL training;Moist Heat;DME and/or AE instruction;Energy conservation;Neuromuscular education;Therapeutic exercise;Building services engineerunctional Mobility Training;Therapeutic activities;Balance training;Patient/family education;Visual/perceptual remediation/compensation;Cognitive remediation/compensation   Plan Initiate HEP for grip strength, balance, activity tolerance, cooking   Consulted and Agree with Plan of Care Patient;Family member/caregiver   Family Member Consulted son Selena BattenKim      Patient will benefit from skilled therapeutic intervention in order to improve the following deficits and impairments:  Cardiopulmonary status limiting activity, Decreased activity tolerance, Decreased balance, Decreased cognition, Decreased knowledge of use of DME, Decreased mobility, Decreased safety awareness, Difficulty walking, Decreased strength, Impaired UE functional use, Impaired sensation, Impaired vision/preception  Visit Diagnosis: Hemiplegia and hemiparesis following unspecified cerebrovascular disease affecting left non-dominant side (HCC)  Visuospatial deficit  Frontal lobe and executive function deficit  Unsteadiness on feet  Other disturbances of skin sensation  Muscle weakness (generalized)  Other symptoms and signs involving the nervous system    Problem List Patient Active Problem List   Diagnosis Date Noted  . Left carotid artery stenosis   . Hypertriglyceridemia, essential 01/10/2016  . 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 Type 2 diabetes mellitus with diabetic neuropathy, with long-term current use of insulin (HCC) 10/06/2015  . Unspecified deficiency anemia 03/10/2014  . Osteoporosis,  unspecified 03/10/2014  . Left bundle branch block 07/01/2013  . Coronary atherosclerosis of native coronary artery 02/16/2013  . Chronic systolic heart failure (HCC) 08/16/2012  . Congestive dilated cardiomyopathy (HCC) 08/02/2012  . HTN (hypertension) 08/02/2012  . Familial tremor 11/29/2010  . VITAMIN D DEFICIENCY 04/04/2010  . Osteoarthrosis, unspecified whether generalized or localized, unspecified site 04/04/2010  . Hyperlipidemia with target LDL less than 70 01/19/2007  . Hypothyroidism 01/14/2007    Collier SalinaGellert, Tyshaun Vinzant M, OTR/L 04/11/2016, 5:00 PM  [facility] [address] [place], [place], [address] Phone: [phone]   Fax:  [phone]  Name: [name] [name] MRN: [id number] Date of Birth: 04/14/1934

## 2016-04-16 ENCOUNTER — Other Ambulatory Visit: Payer: Self-pay

## 2016-04-17 ENCOUNTER — Ambulatory Visit: Payer: Medicare Other | Attending: Internal Medicine | Admitting: Rehabilitative and Restorative Service Providers"

## 2016-04-17 DIAGNOSIS — R41844 Frontal lobe and executive function deficit: Secondary | ICD-10-CM | POA: Diagnosis not present

## 2016-04-17 DIAGNOSIS — R29818 Other symptoms and signs involving the nervous system: Secondary | ICD-10-CM

## 2016-04-17 DIAGNOSIS — R2689 Other abnormalities of gait and mobility: Secondary | ICD-10-CM

## 2016-04-17 DIAGNOSIS — R41842 Visuospatial deficit: Secondary | ICD-10-CM | POA: Insufficient documentation

## 2016-04-17 DIAGNOSIS — R208 Other disturbances of skin sensation: Secondary | ICD-10-CM | POA: Diagnosis not present

## 2016-04-17 DIAGNOSIS — R2681 Unsteadiness on feet: Secondary | ICD-10-CM

## 2016-04-17 DIAGNOSIS — M6281 Muscle weakness (generalized): Secondary | ICD-10-CM | POA: Insufficient documentation

## 2016-04-17 DIAGNOSIS — I69954 Hemiplegia and hemiparesis following unspecified cerebrovascular disease affecting left non-dominant side: Secondary | ICD-10-CM | POA: Diagnosis not present

## 2016-04-17 NOTE — Therapy (Signed)
[facility] [address] [place], [place], [address] Phone: [phone]   Fax:  [phone]  Name: [name] [name] MRN: [id number] Date of Birth: 03/19/1934  [facility] [address] [place], [place], [address] Phone: [phone]   Fax:  [phone]  Name: [name] [name] MRN: [id number] Date of Birth: 03/19/1934  [facility] [address] [place], [place], [address] Phone: [phone]   Fax:  [phone]  Physical Therapy Treatment  Patient Details  Name: [name] [name] MRN: [id number] Date of Birth: 1933-08-31 Referring Provider: Sanda Linger, MD PCP (Dr. Pearlean Brownie, neurologist)  Encounter Date: 04/17/2016      PT End of Session - 04/17/16 1351    Visit Number 3   Number of Visits 18   Date for PT Re-Evaluation 05/31/16   Authorization Type Medicare G-code, TRICARE   PT Start Time 1108   PT Stop Time 1148   PT Time Calculation (min) 40 min   Equipment Utilized During Treatment Gait belt  min guard to min A prn   Activity Tolerance Patient tolerated treatment well   Behavior During Therapy Flat affect  pt with flat affect today, could have been fatigue      Past Medical History:  Diagnosis Date  . Chronic combined systolic and diastolic CHF, NYHA class 3 (HCC)    a. 03/2013 Echo: EF 25-30%, diff HK, sev antsept HK, mild MR.  . Congestive dilated cardiomyopathy (HCC)    a. 03/2013 Echo: EF 25-30%;  b. 09/2013 s/p SJM 3242 CRT-P.  Marland Kitchen Coronary artery disease    a. 08/2012 Cath: LM nl, LAD 24m, LCX nondom, mild-mod nonobs dzs mid, OM1 ok, OM2 90/90p, RCA 40, EF 25-30%-->Med Rx.  . GERD (gastroesophageal reflux disease)   . High cholesterol   . Hypertension   . Hypothyroidism   . Left bundle branch block   . Osteoarthritis   . Stroke (HCC)   . Type II diabetes mellitus (HCC)     Past Surgical History:  Procedure Laterality Date  . ABDOMINAL HYSTERECTOMY  1980  . BI-VENTRICULAR PACEMAKER INSERTION N/A 10/01/2013   Procedure: BI-VENTRICULAR PACEMAKER INSERTION (CRT-P);  Surgeon: Duke Salvia, MD;  Location: [facility] CATH LAB;  Service: Cardiovascular;  Laterality: N/A;  . BI-VENTRICULAR PACEMAKER INSERTION (CRT-P)     a. 09/2013 s/p SJM 3242 CRT-P.  . Bilateral cateract surgery    . BMD  2006  . ORIF TIBIA PLATEAU Right 12/04/2013    Procedure: OPEN REDUCTION INTERNAL FIXATION (ORIF) TIBIAL PLATEAU;  Surgeon: Verlee Rossetti, MD;  Location: [facility];  Service: Orthopedics;  Laterality: Right;  . RIGHT HEART CATHETERIZATION N/A 09/08/2012   Procedure: RIGHT HEART CATH;  Surgeon: Dolores Patty, MD;  Location: [facility] CATH LAB;  Service: Cardiovascular;  Laterality: N/A;    There were no vitals filed for this visit.      Subjective Assessment - 04/17/16 1113    Subjective The patient reports she did some exercises at home, however she does not think any handouts were provided at last session.  The patient arrived today walking with [facility] and when PT inquired about RW, she reports she does not use the RW.  She uses the [facility] indoors/outdoors and her son reports she forgets the cane at times in the house.   Patient is accompained by: Family member  son   Patient Stated Goals To walk and balance better. Build stamina.    Currently in Pain? No/denies                         [facility] Adult PT Treatment/Exercise - 04/17/16 1113      Ambulation/Gait   Ambulation/Gait Yes   Ambulation/Gait Assistance 4: Min guard   Ambulation/Gait Assistance Details Cues to avoid objects on the L side.  Needed  [facility] [address] [place], [place], [address] Phone: [phone]   Fax:  [phone]  Name: [name] [name] MRN: [id number] Date of Birth: 03/19/1934

## 2016-04-19 ENCOUNTER — Ambulatory Visit: Payer: Medicare Other | Admitting: Physical Therapy

## 2016-04-19 DIAGNOSIS — R29818 Other symptoms and signs involving the nervous system: Secondary | ICD-10-CM | POA: Diagnosis not present

## 2016-04-19 DIAGNOSIS — I69954 Hemiplegia and hemiparesis following unspecified cerebrovascular disease affecting left non-dominant side: Secondary | ICD-10-CM | POA: Diagnosis not present

## 2016-04-19 DIAGNOSIS — R41842 Visuospatial deficit: Secondary | ICD-10-CM | POA: Diagnosis not present

## 2016-04-19 DIAGNOSIS — R2689 Other abnormalities of gait and mobility: Secondary | ICD-10-CM

## 2016-04-19 DIAGNOSIS — M6281 Muscle weakness (generalized): Secondary | ICD-10-CM | POA: Diagnosis not present

## 2016-04-19 DIAGNOSIS — R2681 Unsteadiness on feet: Secondary | ICD-10-CM | POA: Diagnosis not present

## 2016-04-21 NOTE — Therapy (Signed)
[facility] [address] [place], [place], [address] Phone: [phone]   Fax:  [phone]  Physical Therapy Treatment  Patient Details  Name: [name] [name] MRN: [id number] Date of Birth: 1933-12-26 Referring Provider: Sanda Linger, MD PCP (Dr. Pearlean Brownie, neurologist)  Encounter Date: 04/19/2016      PT End of Session - 04/21/16 1135    Visit Number 4   Number of Visits 18   Date for PT Re-Evaluation 05/31/16   Authorization Type Medicare G-code, TRICARE   PT Start Time 1146   PT Stop Time 1230   PT Time Calculation (min) 44 min   Equipment Utilized During Treatment Gait belt      Past Medical History:  Diagnosis Date  . Chronic combined systolic and diastolic CHF, NYHA class 3 (HCC)    a. 03/2013 Echo: EF 25-30%, diff HK, sev antsept HK, mild MR.  . Congestive dilated cardiomyopathy (HCC)    a. 03/2013 Echo: EF 25-30%;  b. 09/2013 s/p SJM 3242 CRT-P.  Marland Kitchen Coronary artery disease    a. 08/2012 Cath: LM nl, LAD 87m, LCX nondom, mild-mod nonobs dzs mid, OM1 ok, OM2 90/90p, RCA 40, EF 25-30%-->Med Rx.  . GERD (gastroesophageal reflux disease)   . High cholesterol   . Hypertension   . Hypothyroidism   . Left bundle branch block   . Osteoarthritis   . Stroke (HCC)   . Type II diabetes mellitus (HCC)     Past Surgical History:  Procedure Laterality Date  . ABDOMINAL HYSTERECTOMY  1980  . BI-VENTRICULAR PACEMAKER INSERTION N/A 10/01/2013   Procedure: BI-VENTRICULAR PACEMAKER INSERTION (CRT-P);  Surgeon: Duke Salvia, MD;  Location: [facility] CATH LAB;  Service: Cardiovascular;  Laterality: N/A;  . BI-VENTRICULAR PACEMAKER INSERTION (CRT-P)     a. 09/2013 s/p SJM 3242 CRT-P.  . Bilateral cateract surgery    . BMD  2006  . ORIF TIBIA PLATEAU Right 12/04/2013   Procedure: OPEN REDUCTION INTERNAL FIXATION (ORIF) TIBIAL PLATEAU;  Surgeon: Verlee Rossetti, MD;  Location: [facility];  Service: Orthopedics;  Laterality: Right;  . RIGHT  HEART CATHETERIZATION N/A 09/08/2012   Procedure: RIGHT HEART CATH;  Surgeon: Dolores Patty, MD;  Location: [facility] CATH LAB;  Service: Cardiovascular;  Laterality: N/A;    There were no vitals filed for this visit.      Subjective Assessment - 04/21/16 1130    Subjective Pt's son reports they went to [place] last weekend - pt thought she had the week off from PT to rest up; son states they have not had alot of time to do the HEP yet, but they are going to start    Patient is accompained by: Family member  son   Patient Stated Goals To walk and balance better. Build stamina.    Currently in Pain? No/denies                         [facility] Adult PT Treatment/Exercise - 04/21/16 0001      Ambulation/Gait   Ambulation/Gait Yes   Ambulation/Gait Assistance 5: Supervision   Ambulation/Gait Assistance Details cues to attend to L side; cues to stand erect and increase step length   Ambulation Distance (Feet) 240 Feet   Assistive device Rolling walker   Gait Pattern Step-through pattern;Decreased arm swing - left;Decreased step length - right;Decreased stance time - left;Decreased stride length;Decreased hip/knee flexion - left;Left flexed knee in stance;Lateral hip instability;Trunk flexed;Poor foot clearance -  left   Ambulation Surface Level;Indoor             Balance Exercises - 04/21/16 1134      Balance Exercises: Standing   Standing Eyes Opened Wide (BOA);Foam/compliant surface;Solid surface;2 reps;10 secs  with UE support on compliant surface   Rockerboard Anterior/posterior;Lateral;EO;30 seconds;Other (comment)  20 reps each direction      Ankle sways 10 reps with min hand held assist Stepping over and back of balance beam inside bars with UE support prn - 10 reps each leg Alternate tap ups to 4" step 10 reps each leg - with UE support prn with CGA Standing tossing blue ball with CGA to min assist for balance recovery       PT Short Term Goals -  04/11/16 1557      PT SHORT TERM GOAL #1   Title Patient with family cues demonstrates understanding of [place] HEP. (Target Date: 05/08/2016)   Time 1   Period Months   Status On-going     PT SHORT TERM GOAL #2   Title Timed Up-Go <15sec with supervision. (Target Date: 05/08/2016)   Time 1   Period Months   Status On-going     PT SHORT TERM GOAL #3   Title Standing balance turning head to look to side and reaches 5" & to floor with supervision. (Target Date: 05/08/2016)   Time 1   Period Months   Status On-going     PT SHORT TERM GOAL #4   Title Patient ambulates 250' around obstacles with LRAD with supervision. (Target Date: 05/08/2016)   Time 1   Period Months   Status On-going     PT SHORT TERM GOAL #5   Title Cognitive TUG <25sec with simple naming task.  (Target Date: 05/08/2016)   Time 1   Period Months   Status On-going           PT Long Term Goals - 04/11/16 1557      PT LONG TERM GOAL #1   Title Patient and family demonstrate & verbalize understanding of ongoing fitness plan & HEP. (Target Date: 05/31/2016)   Time 2   Period Months   Status On-going     PT LONG TERM GOAL #2   Title Timed Up-Go standard <13.5sec safely without device. (Target Date: 05/31/2016)   Time 2   Period Months   Status On-going     PT LONG TERM GOAL #3   Title Berg Balance >36/56 to indicate lower fall risk. (Target Date: 05/31/2016)   Time 2   Period Months   Status On-going     PT LONG TERM GOAL #4   Title Dynamic Gait Index >/= 10/24 to indicate lower fall risk. (Target Date: 05/31/2016)   Time 2   Period Months   Status On-going     PT LONG TERM GOAL #5   Title Patient ambulates 500' with LRAD with supervision for cognitive deficits no balance losses safely. (Target Date: 05/31/2016)   Time 2   Period Months   Status On-going     PT LONG TERM GOAL #6   Title Patient negotiates ramps, curbs & stairs (1 rail) with LRAD with supervision from family safely. (Target Date:  05/31/2016)   Time 2   Period Months   Status On-going     PT LONG TERM GOAL #7   Title Patient ambulates around furniture with LRAD modified independent for household mobility. (Target Date: 05/31/2016)   Time 2   Period Months  Status On-going               Plan - 04/21/16 1136    Clinical Impression Statement Pt using RW for assistance with ambulation today, providing increased safety with ambulation:  pt fatigues easily - needs frequent seated rest breaks   Rehab Potential Good   PT Frequency 2x / week   PT Duration Other (comment)   PT Treatment/Interventions ADLs/Self Care Home Management;DME Instruction;Gait training;Stair training;Functional mobility training;Therapeutic activities;Therapeutic exercise;Balance training;Neuromuscular re-education;Patient/family education;Orthotic Fit/Training;Vestibular   PT Next Visit Plan cont balance and gait training   Consulted and Agree with Plan of Care Patient;Family member/caregiver   Family Member Consulted son      Patient will benefit from skilled therapeutic intervention in order to improve the following deficits and impairments:  Abnormal gait, Decreased activity tolerance, Decreased balance, Decreased coordination, Decreased endurance, Decreased mobility, Decreased safety awareness, Decreased strength, Impaired vision/preception, Postural dysfunction  Visit Diagnosis: Other abnormalities of gait and mobility     Problem List Patient Active Problem List   Diagnosis Date Noted  . Left carotid artery stenosis   . Hypertriglyceridemia, essential 01/10/2016  . 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 Type 2 diabetes mellitus with diabetic neuropathy, with long-term current use of insulin (HCC) 10/06/2015  . Unspecified deficiency anemia 03/10/2014  . Osteoporosis, unspecified 03/10/2014  . Left bundle branch block 07/01/2013  . Coronary atherosclerosis of native  coronary artery 02/16/2013  . Chronic systolic heart failure (HCC) 08/16/2012  . Congestive dilated cardiomyopathy (HCC) 08/02/2012  . HTN (hypertension) 08/02/2012  . Familial tremor 11/29/2010  . VITAMIN D DEFICIENCY 04/04/2010  . Osteoarthrosis, unspecified whether generalized or localized, unspecified site 04/04/2010  . Hyperlipidemia with target LDL less than 70 01/19/2007  . Hypothyroidism 01/14/2007    Kary Kos, PT 04/21/2016, 11:43 AM  [facility] [address] [place], [place], [address] Phone: [phone]   Fax:  [phone]  Name: Libertie Hausler MRN: [id number] Date of Birth: 07/02/1934

## 2016-04-24 ENCOUNTER — Ambulatory Visit: Payer: Medicare Other | Admitting: Physical Therapy

## 2016-04-24 ENCOUNTER — Telehealth: Payer: Self-pay | Admitting: *Deleted

## 2016-04-24 ENCOUNTER — Other Ambulatory Visit: Payer: Self-pay | Admitting: Internal Medicine

## 2016-04-24 ENCOUNTER — Encounter: Payer: Self-pay | Admitting: Occupational Therapy

## 2016-04-24 ENCOUNTER — Ambulatory Visit: Payer: Medicare Other | Admitting: Occupational Therapy

## 2016-04-24 DIAGNOSIS — I69954 Hemiplegia and hemiparesis following unspecified cerebrovascular disease affecting left non-dominant side: Secondary | ICD-10-CM

## 2016-04-24 DIAGNOSIS — E1140 Type 2 diabetes mellitus with diabetic neuropathy, unspecified: Secondary | ICD-10-CM

## 2016-04-24 DIAGNOSIS — R41844 Frontal lobe and executive function deficit: Secondary | ICD-10-CM

## 2016-04-24 DIAGNOSIS — R2681 Unsteadiness on feet: Secondary | ICD-10-CM | POA: Diagnosis not present

## 2016-04-24 DIAGNOSIS — R2689 Other abnormalities of gait and mobility: Secondary | ICD-10-CM | POA: Diagnosis not present

## 2016-04-24 DIAGNOSIS — M6281 Muscle weakness (generalized): Secondary | ICD-10-CM | POA: Diagnosis not present

## 2016-04-24 DIAGNOSIS — R41842 Visuospatial deficit: Secondary | ICD-10-CM

## 2016-04-24 DIAGNOSIS — R29818 Other symptoms and signs involving the nervous system: Secondary | ICD-10-CM | POA: Diagnosis not present

## 2016-04-24 DIAGNOSIS — L299 Pruritus, unspecified: Secondary | ICD-10-CM

## 2016-04-24 DIAGNOSIS — R208 Other disturbances of skin sensation: Secondary | ICD-10-CM

## 2016-04-24 MED ORDER — HYDROXYZINE HCL 25 MG PO TABS: 25.0000 mg | ORAL_TABLET | Freq: Three times a day (TID) | ORAL | 3 refills | Status: DC | PRN

## 2016-04-24 MED ORDER — HYDROCODONE-ACETAMINOPHEN 5-325 MG PO TABS: 1.0000 | ORAL_TABLET | Freq: Four times a day (QID) | ORAL | 0 refills | Status: DC | PRN

## 2016-04-24 NOTE — Telephone Encounter (Signed)
Rec'd call pt states she is needing a refill on the Atarax & hydrocodone that MD rx once before. She is having bad neurothapy pain & itching in her feet & legs...[name]

## 2016-04-24 NOTE — Telephone Encounter (Signed)
Rx written.

## 2016-04-24 NOTE — Therapy (Signed)
[facility] [address] [place], [place], [address] Phone: [phone]   Fax:  [phone]  Occupational Therapy Treatment  Patient Details  Name: [name] [name] MRN: [id number] Date of Birth: 1934-06-02 Referring Provider: Dr. Sanda Linger  Encounter Date: 04/24/2016      OT End of Session - 04/24/16 1629    Visit Number 3   Number of Visits 16   Date for OT Re-Evaluation 06/03/16   Authorization Type medicare- will need g code and progress note every 10th visit   Authorization Time Period 60 days   Authorization - Visit Number 3   Authorization - Number of Visits 10   OT Start Time 1533   OT Stop Time 1615   OT Time Calculation (min) 42 min   Activity Tolerance Patient tolerated treatment well   Behavior During Therapy [facility] for tasks assessed/performed      Past Medical History:  Diagnosis Date  . Chronic combined systolic and diastolic CHF, NYHA class 3 (HCC)    a. 03/2013 Echo: EF 25-30%, diff HK, sev antsept HK, mild MR.  . Congestive dilated cardiomyopathy (HCC)    a. 03/2013 Echo: EF 25-30%;  b. 09/2013 s/p SJM 3242 CRT-P.  Marland Kitchen Coronary artery disease    a. 08/2012 Cath: LM nl, LAD 93m, LCX nondom, mild-mod nonobs dzs mid, OM1 ok, OM2 90/90p, RCA 40, EF 25-30%-->Med Rx.  . GERD (gastroesophageal reflux disease)   . High cholesterol   . Hypertension   . Hypothyroidism   . Left bundle branch block   . Osteoarthritis   . Stroke (HCC)   . Type II diabetes mellitus (HCC)     Past Surgical History:  Procedure Laterality Date  . ABDOMINAL HYSTERECTOMY  1980  . BI-VENTRICULAR PACEMAKER INSERTION N/A 10/01/2013   Procedure: BI-VENTRICULAR PACEMAKER INSERTION (CRT-P);  Surgeon: Duke Salvia, MD;  Location: [facility] CATH LAB;  Service: Cardiovascular;  Laterality: N/A;  . BI-VENTRICULAR PACEMAKER INSERTION (CRT-P)     a. 09/2013 s/p SJM 3242 CRT-P.  . Bilateral cateract surgery    . BMD  2006  . ORIF TIBIA PLATEAU  Right 12/04/2013   Procedure: OPEN REDUCTION INTERNAL FIXATION (ORIF) TIBIAL PLATEAU;  Surgeon: Verlee Rossetti, MD;  Location: [facility] ORS;  Service: Orthopedics;  Laterality: Right;  . RIGHT HEART CATHETERIZATION N/A 09/08/2012   Procedure: RIGHT HEART CATH;  Surgeon: Dolores Patty, MD;  Location: [facility] CATH LAB;  Service: Cardiovascular;  Laterality: N/A;    There were no vitals filed for this visit.      Subjective Assessment - 04/24/16 1620    Subjective  I don't think I have trouble seeing over there.  (to the left)   Patient is accompained by: Family member   Pertinent History see epic snapshot, CVA in 12/2015, diabetic neuropathy   Patient Stated Goals I want better energy   Currently in Pain? No/denies   Multiple Pain Sites No                      OT Treatments/Exercises (OP) - 04/24/16 0001      ADLs   Cooking Used cooking as a modality to address multiple impairments simultaneously, attention, visual attention to left, memory, functional use of left extremities, and activity tolerance.  Patient needed moderate assist for set up of needed tools, supplies for this task.  Patient needed min cueing to locate items on left side of workstation.  Patient needed max cueing to read instructions  on left side of box.  Patient unaware of her deficits from this stroke.  Patient with decreased safety with ambulation device - straight cane.  Patient interchanges cane from right to left hand, she attempts to prop it against things, and lose track of it.  Patient very fatigued after PT appointment, and needed to do much of this task seated.  Patient felt to do better in familiar versus novel task due to severity of cognitive impairments.                  OT Education - 04/24/16 1628    Education provided Yes   Education Details Reviewed today's session results with son in patient's presence.  Encouraged continued cueing to scan to left environment.     Person(s) Educated  Patient;Child(ren)   Methods Explanation;Demonstration   Comprehension Verbalized understanding;Need further instruction          OT Short Term Goals - 04/24/16 1634      OT SHORT TERM GOAL #1   Title Pt and family will be mod I with home activities program - 05/06/2016   Status On-going     OT SHORT TERM GOAL #2   Title Pt will need  min vc's for table top scanning activities with functional task    Status On-going     OT SHORT TERM GOAL #3   Title Pt will be supervision for simple hot meal prep making familiar hot meal   Status On-going     OT SHORT TERM GOAL #4   Title Pt will increase grip strength by at least 3 pounds to assist in functional tasks (baseline = 30 pounds)   Status On-going     OT SHORT TERM GOAL #5   Title Pt will be mod I with bathing at shower level   Status On-going           OT Long Term Goals - 04/24/16 1635      OT LONG TERM GOAL #1   Title Pt and family will be mod I with home activities program - 06/03/2016   Status On-going     OT LONG TERM GOAL #2   Title Pt will be min vc's for environmental scanning with familiar functional tasks   Status On-going     OT LONG TERM GOAL #3   Title Pt will increase grip strength in LUE by 5 pounds to assist with functioal activities (baseline= 30)   Status On-going     OT LONG TERM GOAL #4   Title Pt will tolerate 25 minutes of ambulatory activity with no more than 1 rest break   Status On-going     OT LONG TERM GOAL #5   Title Pt will be mod I with shower transfers   Status On-going               Plan - 04/24/16 1629    Clinical Impression Statement Patient with poor awareness of her deficits.  Patient with significant cognitive, perceptual and sensory deficits from this stroke.  Patient with strong family support.     Rehab Potential Good   OT Frequency 2x / week   OT Duration 8 weeks   OT Treatment/Interventions Self-care/ADL training;Moist Heat;DME and/or AE instruction;Energy  conservation;Neuromuscular education;Therapeutic exercise;Building services engineerunctional Mobility Training;Therapeutic activities;Balance training;Patient/family education;Visual/perceptual remediation/compensation;Cognitive remediation/compensation   Plan Initiate HEP for grip strength, balance, laundry   Consulted and Agree with Plan of Care Patient;Family member/caregiver   Family Member Consulted son Selena BattenKim  Patient will benefit from skilled therapeutic intervention in order to improve the following deficits and impairments:  Cardiopulmonary status limiting activity, Decreased activity tolerance, Decreased balance, Decreased cognition, Decreased knowledge of use of DME, Decreased mobility, Decreased safety awareness, Difficulty walking, Decreased strength, Impaired UE functional use, Impaired sensation, Impaired vision/preception  Visit Diagnosis: Other symptoms and signs involving the nervous system  Unsteadiness on feet  Hemiplegia and hemiparesis following unspecified cerebrovascular disease affecting left non-dominant side (HCC)  Visuospatial deficit  Frontal lobe and executive function deficit  Other disturbances of skin sensation  Muscle weakness (generalized)    Problem List Patient Active Problem List   Diagnosis Date Noted  . Diabetic neuropathy, painful (HCC) 04/24/2016  . Left carotid artery stenosis   . Hypertriglyceridemia, essential 01/10/2016  . 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 Type 2 diabetes mellitus with diabetic neuropathy, with long-term current use of insulin (HCC) 10/06/2015  . Unspecified deficiency anemia 03/10/2014  . Osteoporosis, unspecified 03/10/2014  . Left bundle branch block 07/01/2013  . Coronary atherosclerosis of native coronary artery 02/16/2013  . Chronic systolic heart failure (HCC) 08/16/2012  . Congestive dilated cardiomyopathy (HCC) 08/02/2012  . HTN (hypertension) 08/02/2012  . Familial  tremor 11/29/2010  . VITAMIN D DEFICIENCY 04/04/2010  . Osteoarthrosis, unspecified whether generalized or localized, unspecified site 04/04/2010  . Hyperlipidemia with target LDL less than 70 01/19/2007  . Hypothyroidism 01/14/2007    Collier Salina 04/24/2016, 4:36 PM  [facility] [address] [place], [place], [address] Phone: [phone]   Fax:  [phone]  Name: [name] [name] MRN: [id number] Date of Birth: 04/30/1934

## 2016-04-25 ENCOUNTER — Encounter: Payer: Self-pay | Admitting: Occupational Therapy

## 2016-04-25 ENCOUNTER — Ambulatory Visit: Payer: Medicare Other | Admitting: Occupational Therapy

## 2016-04-25 DIAGNOSIS — R41844 Frontal lobe and executive function deficit: Secondary | ICD-10-CM

## 2016-04-25 DIAGNOSIS — R29818 Other symptoms and signs involving the nervous system: Secondary | ICD-10-CM

## 2016-04-25 DIAGNOSIS — R41842 Visuospatial deficit: Secondary | ICD-10-CM | POA: Diagnosis not present

## 2016-04-25 DIAGNOSIS — I69954 Hemiplegia and hemiparesis following unspecified cerebrovascular disease affecting left non-dominant side: Secondary | ICD-10-CM | POA: Diagnosis not present

## 2016-04-25 DIAGNOSIS — R2681 Unsteadiness on feet: Secondary | ICD-10-CM | POA: Diagnosis not present

## 2016-04-25 DIAGNOSIS — R208 Other disturbances of skin sensation: Secondary | ICD-10-CM

## 2016-04-25 DIAGNOSIS — R2689 Other abnormalities of gait and mobility: Secondary | ICD-10-CM | POA: Diagnosis not present

## 2016-04-25 DIAGNOSIS — M6281 Muscle weakness (generalized): Secondary | ICD-10-CM

## 2016-04-25 MED ORDER — HYDROCODONE-ACETAMINOPHEN 5-325 MG PO TABS: 1.0000 | ORAL_TABLET | Freq: Four times a day (QID) | ORAL | 0 refills | Status: DC | PRN

## 2016-04-25 NOTE — Therapy (Signed)
Title Patient ambulates 500' with LRAD with supervision for cognitive deficits no balance losses safely. (Target Date: 05/31/2016)   Time 2   Period Months   Status On-going     PT LONG TERM GOAL #6   Title Patient negotiates ramps, curbs & stairs (1 rail) with LRAD with supervision from family safely. (Target Date: 05/31/2016)   Time 2   Period Months   Status On-going     PT LONG TERM GOAL #7   Title Patient ambulates around furniture with LRAD modified independent for household mobility. (Target Date: 05/31/2016)   Time 2   Period Months   Status On-going               Plan - 04/24/16 1700    Clinical Impression Statement Patient continues to fatigue easily which increases distraction and balance losses. She will scan left better when cued but not spontaneously to avoid obstacles. Patient is on target to meet STGs   Rehab Potential Good   PT Frequency 2x / week   PT Duration Other (comment)   PT Treatment/Interventions ADLs/Self Care Home Management;DME Instruction;Gait training;Stair training;Functional mobility training;Therapeutic activities;Therapeutic exercise;Balance training;Neuromuscular re-education;Patient/family education;Orthotic Fit/Training;Vestibular   PT Next Visit Plan cont balance and gait training   Consulted and Agree with Plan of Care Patient;Family member/caregiver   Family Member Consulted son      Patient will benefit from skilled therapeutic intervention in order to improve the following deficits and impairments:  Abnormal  gait, Decreased activity tolerance, Decreased balance, Decreased coordination, Decreased endurance, Decreased mobility, Decreased safety awareness, Decreased strength, Impaired vision/preception, Postural dysfunction  Visit Diagnosis: Other symptoms and signs involving the nervous system  Unsteadiness on feet  Hemiplegia and hemiparesis following unspecified cerebrovascular disease affecting left non-dominant side [facility])     Problem List Patient Active Problem List   Diagnosis Date Noted  . Diabetic neuropathy, painful (HCC) 04/24/2016  . Left carotid artery stenosis   . Hypertriglyceridemia, essential 01/10/2016  . 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 Type 2 diabetes mellitus with diabetic neuropathy, with long-term current use of insulin (HCC) 10/06/2015  . Unspecified deficiency anemia 03/10/2014  . Osteoporosis, unspecified 03/10/2014  . Left bundle branch block 07/01/2013  . Coronary atherosclerosis of native coronary artery 02/16/2013  . Chronic systolic heart failure (HCC) 08/16/2012  . Congestive dilated cardiomyopathy (HCC) 08/02/2012  . HTN (hypertension) 08/02/2012  . Familial tremor 11/29/2010  . VITAMIN D DEFICIENCY 04/04/2010  . Osteoarthrosis, unspecified whether generalized or localized, unspecified site 04/04/2010  . Hyperlipidemia with target LDL less than 70 01/19/2007  . Hypothyroidism 01/14/2007    Turkessa Ostrom PT, DPT 04/25/2016, 7:50 AM  [facility] [address] [place], [place], [address] Phone: [phone]   Fax:  [phone]  Name: [name] [name] MRN: [id number] Date of Birth: July 25, 1934  Title Patient ambulates 500' with LRAD with supervision for cognitive deficits no balance losses safely. (Target Date: 05/31/2016)   Time 2   Period Months   Status On-going     PT LONG TERM GOAL #6   Title Patient negotiates ramps, curbs & stairs (1 rail) with LRAD with supervision from family safely. (Target Date: 05/31/2016)   Time 2   Period Months   Status On-going     PT LONG TERM GOAL #7   Title Patient ambulates around furniture with LRAD modified independent for household mobility. (Target Date: 05/31/2016)   Time 2   Period Months   Status On-going               Plan - 04/24/16 1700    Clinical Impression Statement Patient continues to fatigue easily which increases distraction and balance losses. She will scan left better when cued but not spontaneously to avoid obstacles. Patient is on target to meet STGs   Rehab Potential Good   PT Frequency 2x / week   PT Duration Other (comment)   PT Treatment/Interventions ADLs/Self Care Home Management;DME Instruction;Gait training;Stair training;Functional mobility training;Therapeutic activities;Therapeutic exercise;Balance training;Neuromuscular re-education;Patient/family education;Orthotic Fit/Training;Vestibular   PT Next Visit Plan cont balance and gait training   Consulted and Agree with Plan of Care Patient;Family member/caregiver   Family Member Consulted son      Patient will benefit from skilled therapeutic intervention in order to improve the following deficits and impairments:  Abnormal  gait, Decreased activity tolerance, Decreased balance, Decreased coordination, Decreased endurance, Decreased mobility, Decreased safety awareness, Decreased strength, Impaired vision/preception, Postural dysfunction  Visit Diagnosis: Other symptoms and signs involving the nervous system  Unsteadiness on feet  Hemiplegia and hemiparesis following unspecified cerebrovascular disease affecting left non-dominant side [facility])     Problem List Patient Active Problem List   Diagnosis Date Noted  . Diabetic neuropathy, painful (HCC) 04/24/2016  . Left carotid artery stenosis   . Hypertriglyceridemia, essential 01/10/2016  . 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 Type 2 diabetes mellitus with diabetic neuropathy, with long-term current use of insulin (HCC) 10/06/2015  . Unspecified deficiency anemia 03/10/2014  . Osteoporosis, unspecified 03/10/2014  . Left bundle branch block 07/01/2013  . Coronary atherosclerosis of native coronary artery 02/16/2013  . Chronic systolic heart failure (HCC) 08/16/2012  . Congestive dilated cardiomyopathy (HCC) 08/02/2012  . HTN (hypertension) 08/02/2012  . Familial tremor 11/29/2010  . VITAMIN D DEFICIENCY 04/04/2010  . Osteoarthrosis, unspecified whether generalized or localized, unspecified site 04/04/2010  . Hyperlipidemia with target LDL less than 70 01/19/2007  . Hypothyroidism 01/14/2007    Turkessa Ostrom PT, DPT 04/25/2016, 7:50 AM  [facility] [address] [place], [place], [address] Phone: [phone]   Fax:  [phone]  Name: [name] [name] MRN: [id number] Date of Birth: July 25, 1934  [facility] [address] [place], [place], [address] Phone: [phone]   Fax:  [phone]  Physical Therapy Treatment  Patient Details  Name: [name] [name] MRN: [id number] Date of Birth: 1933/09/11 Referring Provider: Sanda Linger, MD PCP (Dr. Pearlean Brownie, neurologist)  Encounter Date: 04/24/2016      PT End of Session - 04/24/16 1700    Visit Number 5   Number of Visits 18   Date for PT Re-Evaluation 05/31/16   Authorization Type Medicare G-code, TRICARE   PT Start Time 1446   PT Stop Time 1530   PT Time Calculation (min) 44 min   Equipment Utilized During Treatment Gait belt   Activity Tolerance Patient tolerated treatment well   Behavior During Therapy [facility] for tasks assessed/performed      Past Medical History:  Diagnosis Date  . Chronic combined systolic and diastolic CHF, NYHA class 3 (HCC)    a. 03/2013 Echo: EF 25-30%, diff HK, sev antsept HK, mild MR.  . Congestive dilated cardiomyopathy (HCC)    a. 03/2013 Echo: EF 25-30%;  b. 09/2013 s/p SJM 3242 CRT-P.  Marland Kitchen Coronary artery disease    a. 08/2012 Cath: LM nl, LAD 59m, LCX nondom, mild-mod nonobs dzs mid, OM1 ok, OM2 90/90p, RCA 40, EF 25-30%-->Med Rx.  . GERD (gastroesophageal reflux disease)   . High cholesterol   . Hypertension   . Hypothyroidism   . Left bundle branch block   . Osteoarthritis   . Stroke (HCC)   . Type II diabetes mellitus (HCC)     Past Surgical History:  Procedure Laterality Date  . ABDOMINAL HYSTERECTOMY  1980  . BI-VENTRICULAR PACEMAKER INSERTION N/A 10/01/2013   Procedure: BI-VENTRICULAR PACEMAKER INSERTION (CRT-P);  Surgeon: Duke Salvia, MD;  Location: [facility] CATH LAB;  Service: Cardiovascular;  Laterality: N/A;  . BI-VENTRICULAR PACEMAKER INSERTION (CRT-P)     a. 09/2013 s/p SJM 3242 CRT-P.  . Bilateral cateract surgery    . BMD  2006  . ORIF TIBIA PLATEAU Right 12/04/2013   Procedure: OPEN REDUCTION INTERNAL FIXATION (ORIF)  TIBIAL PLATEAU;  Surgeon: Verlee Rossetti, MD;  Location: [facility]S;  Service: Orthopedics;  Laterality: Right;  . RIGHT HEART CATHETERIZATION N/A 09/08/2012   Procedure: RIGHT HEART CATH;  Surgeon: Dolores Patty, MD;  Location: [facility] CATH LAB;  Service: Cardiovascular;  Laterality: N/A;    There were no vitals filed for this visit.      Subjective Assessment - 04/24/16 1449    Subjective No falls. She called doctor for more diabetic neuropathy pain in feet.    Patient is accompained by: Family member   Limitations Standing;Walking;Lifting   Patient Stated Goals To walk and balance better. Build stamina.    Currently in Pain? No/denies                         [facility] Adult PT Treatment/Exercise - 04/24/16 1445      Ambulation/Gait   Ambulation/Gait Yes   Ambulation/Gait Assistance 4: Min guard;5: Supervision   Ambulation/Gait Assistance Details tactile & verbal cues on scanning environment to avoid ostacles while maintaining path & pace.   Ambulation Distance (Feet) 300 Feet  300' X 2   Assistive device Straight cane   Gait Pattern Step-through pattern;Decreased arm swing - left;Decreased step length - right;Decreased stance time - left;Decreased stride length;Decreased hip/knee flexion - left;Left flexed knee in stance;Lateral hip instability;Trunk flexed;Poor foot clearance - left   Ambulation Surface

## 2016-04-25 NOTE — Telephone Encounter (Signed)
Can not locate original rx md printed. So re-printed script since md is out had Dr. Posey Rea to sign. called pt to inform ready for pick-up...[name] [name]

## 2016-04-25 NOTE — Therapy (Signed)
[facility] [address] [place], [place], [address] Phone: [phone]   Fax:  [phone]  Occupational Therapy Treatment  Patient Details  Name: [name] [name] MRN: [id number] Date of Birth: Oct 21, 1933 Referring Provider: Dr. Sanda Linger  Encounter Date: 04/25/2016      OT End of Session - 04/25/16 1618    Visit Number 4   Number of Visits 16   Date for OT Re-Evaluation 06/03/16   Authorization Type medicare- will need g code and progress note every 10th visit   Authorization Time Period 60 days   Authorization - Visit Number 4   Authorization - Number of Visits 10   OT Start Time 1316   OT Stop Time 1400   OT Time Calculation (min) 44 min   Activity Tolerance Patient tolerated treatment well   Behavior During Therapy [facility] for tasks assessed/performed      Past Medical History:  Diagnosis Date  . Chronic combined systolic and diastolic CHF, NYHA class 3 (HCC)    a. 03/2013 Echo: EF 25-30%, diff HK, sev antsept HK, mild MR.  . Congestive dilated cardiomyopathy (HCC)    a. 03/2013 Echo: EF 25-30%;  b. 09/2013 s/p SJM 3242 CRT-P.  Marland Kitchen Coronary artery disease    a. 08/2012 Cath: LM nl, LAD 64m, LCX nondom, mild-mod nonobs dzs mid, OM1 ok, OM2 90/90p, RCA 40, EF 25-30%-->Med Rx.  . GERD (gastroesophageal reflux disease)   . High cholesterol   . Hypertension   . Hypothyroidism   . Left bundle branch block   . Osteoarthritis   . Stroke (HCC)   . Type II diabetes mellitus (HCC)     Past Surgical History:  Procedure Laterality Date  . ABDOMINAL HYSTERECTOMY  1980  . BI-VENTRICULAR PACEMAKER INSERTION N/A 10/01/2013   Procedure: BI-VENTRICULAR PACEMAKER INSERTION (CRT-P);  Surgeon: Duke Salvia, MD;  Location: [facility] CATH LAB;  Service: Cardiovascular;  Laterality: N/A;  . BI-VENTRICULAR PACEMAKER INSERTION (CRT-P)     a. 09/2013 s/p SJM 3242 CRT-P.  . Bilateral cateract surgery    . BMD  2006  . ORIF TIBIA PLATEAU  Right 12/04/2013   Procedure: OPEN REDUCTION INTERNAL FIXATION (ORIF) TIBIAL PLATEAU;  Surgeon: Verlee Rossetti, MD;  Location: [facility] ORS;  Service: Orthopedics;  Laterality: Right;  . RIGHT HEART CATHETERIZATION N/A 09/08/2012   Procedure: RIGHT HEART CATH;  Surgeon: Dolores Patty, MD;  Location: [facility] CATH LAB;  Service: Cardiovascular;  Laterality: N/A;    There were no vitals filed for this visit.      Subjective Assessment - 04/24/16 1620    Subjective  I don't think I have trouble seeing over there.  (to the left)   Patient is accompained by: Family member   Pertinent History see epic snapshot, CVA in 12/2015, diabetic neuropathy   Patient Stated Goals I want better energy   Currently in Pain? No/denies   Multiple Pain Sites No                      OT Treatments/Exercises (OP) - 04/25/16 0001      ADLs   Cooking Used familar environment to address patient's visual scanning, awarenss, balance, and self organizing skills.  Patient taken into disorderly and unsafe kitchen, and instructed to help clean up.  Patient able to reach to floor without loss of balance.  Patient able to walk short distances in kitchen without cane, without loss of balance.  Patient needed mod cueing to  locate anything out of place to left of midline, at times even assisting patient with head turn to right to overcompensate.  Patient fatigued after about 2 minutes up on feet, and quickly seeking seated rest break.  Patient reporting back pain with standing today.  Offered hot pack, patient declined.  Pain resolved with seated rest break in less than one minute.       Visual/Perceptual Exercises   Scanning - Tabletop Used card sorting as both cognitive and visual challenge for patient.  Initially sorted by color, then by suit, then by number from lowest to highest.  Patient needed mod cueing initially to locate cards to left of midline - however, with repetition, beter able to scan left.  Patient able to  switch mindset for sorting tasks with only minimal cueing, then intermittent cues for accuracy.  Patient easily fatigued with physical or cognitive challenge, and error rate is much higher with fatigue.  Worked to increase speed of response before fatigue set in.               Balance Exercises - 04/24/16 1445      Balance Exercises: Standing   Standing Eyes Opened Wide (BOA);Foam/compliant surface;Solid surface;2 reps;10 secs  with tactile cues & intermittent UE support   Balance Beam foam beam with head turns and alternate stepping on/off forward & backward to facilitate balance reactions & step strategy  intermittent UE support & minA /tactile cues           OT Education - 04/25/16 1618    Education provided Yes   Education Details Scan left environment   Person(s) Educated Patient   Methods Explanation;Demonstration;Tactile cues;Verbal cues   Comprehension Need further instruction          OT Short Term Goals - 04/24/16 1634      OT SHORT TERM GOAL #1   Title Pt and family will be mod I with home activities program - 05/06/2016   Status On-going     OT SHORT TERM GOAL #2   Title Pt will need  min vc's for table top scanning activities with functional task    Status On-going     OT SHORT TERM GOAL #3   Title Pt will be supervision for simple hot meal prep making familiar hot meal   Status On-going     OT SHORT TERM GOAL #4   Title Pt will increase grip strength by at least 3 pounds to assist in functional tasks (baseline = 30 pounds)   Status On-going     OT SHORT TERM GOAL #5   Title Pt will be mod I with bathing at shower level   Status On-going           OT Long Term Goals - 04/24/16 1635      OT LONG TERM GOAL #1   Title Pt and family will be mod I with home activities program - 06/03/2016   Status On-going     OT LONG TERM GOAL #2   Title Pt will be min vc's for environmental scanning with familiar functional tasks   Status On-going     OT  LONG TERM GOAL #3   Title Pt will increase grip strength in LUE by 5 pounds to assist with functioal activities (baseline= 30)   Status On-going     OT LONG TERM GOAL #4   Title Pt will tolerate 25 minutes of ambulatory activity with no more than 1 rest break   Status On-going  OT LONG TERM GOAL #5   Title Pt will be mod I with shower transfers   Status On-going               Plan - 04/25/16 1619    Clinical Impression Statement Patient with significant cognitive, perceptual, sensory deficits - beginning to verbalize some intellectual awareness of why she is coming to therapy.     Rehab Potential Good   OT Frequency 2x / week   OT Duration 8 weeks   OT Treatment/Interventions Self-care/ADL training;Moist Heat;DME and/or AE instruction;Energy conservation;Neuromuscular education;Therapeutic exercise;Building services engineer;Therapeutic activities;Balance training;Patient/family education;Visual/perceptual remediation/compensation;Cognitive remediation/compensation   Plan Initiate HEP for grip strength, balance, laundry   Consulted and Agree with Plan of Care Patient;Family member/caregiver      Patient will benefit from skilled therapeutic intervention in order to improve the following deficits and impairments:  Cardiopulmonary status limiting activity, Decreased activity tolerance, Decreased balance, Decreased cognition, Decreased knowledge of use of DME, Decreased mobility, Decreased safety awareness, Difficulty walking, Decreased strength, Impaired UE functional use, Impaired sensation, Impaired vision/preception  Visit Diagnosis: Other symptoms and signs involving the nervous system  Unsteadiness on feet  Hemiplegia and hemiparesis following unspecified cerebrovascular disease affecting left non-dominant side (HCC)  Visuospatial deficit  Frontal lobe and executive function deficit  Other disturbances of skin sensation  Muscle weakness  (generalized)    Problem List Patient Active Problem List   Diagnosis Date Noted  . Diabetic neuropathy, painful (HCC) 04/24/2016  . Left carotid artery stenosis   . Hypertriglyceridemia, essential 01/10/2016  . 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 Type 2 diabetes mellitus with diabetic neuropathy, with long-term current use of insulin (HCC) 10/06/2015  . Unspecified deficiency anemia 03/10/2014  . Osteoporosis, unspecified 03/10/2014  . Left bundle branch block 07/01/2013  . Coronary atherosclerosis of native coronary artery 02/16/2013  . Chronic systolic heart failure (HCC) 08/16/2012  . Congestive dilated cardiomyopathy (HCC) 08/02/2012  . HTN (hypertension) 08/02/2012  . Familial tremor 11/29/2010  . VITAMIN D DEFICIENCY 04/04/2010  . Osteoarthrosis, unspecified whether generalized or localized, unspecified site 04/04/2010  . Hyperlipidemia with target LDL less than 70 01/19/2007  . Hypothyroidism 01/14/2007    Collier Salina 04/25/2016, 4:21 PM  [facility] [address] [place], [place], [address] Phone: [phone]   Fax:  [phone]  Name: [name] [name] MRN: [id number] Date of Birth: [birth year]

## 2016-04-29 ENCOUNTER — Ambulatory Visit: Payer: Medicare Other | Admitting: Physical Therapy

## 2016-04-29 ENCOUNTER — Ambulatory Visit: Payer: Medicare Other | Admitting: Occupational Therapy

## 2016-04-29 ENCOUNTER — Encounter: Payer: Self-pay | Admitting: Physical Therapy

## 2016-04-29 ENCOUNTER — Encounter: Payer: Self-pay | Admitting: Occupational Therapy

## 2016-04-29 DIAGNOSIS — R29818 Other symptoms and signs involving the nervous system: Secondary | ICD-10-CM | POA: Diagnosis not present

## 2016-04-29 DIAGNOSIS — R41842 Visuospatial deficit: Secondary | ICD-10-CM | POA: Diagnosis not present

## 2016-04-29 DIAGNOSIS — I69954 Hemiplegia and hemiparesis following unspecified cerebrovascular disease affecting left non-dominant side: Secondary | ICD-10-CM | POA: Diagnosis not present

## 2016-04-29 DIAGNOSIS — M6281 Muscle weakness (generalized): Secondary | ICD-10-CM

## 2016-04-29 DIAGNOSIS — R2689 Other abnormalities of gait and mobility: Secondary | ICD-10-CM

## 2016-04-29 DIAGNOSIS — R2681 Unsteadiness on feet: Secondary | ICD-10-CM

## 2016-04-29 DIAGNOSIS — R208 Other disturbances of skin sensation: Secondary | ICD-10-CM

## 2016-04-29 DIAGNOSIS — R41844 Frontal lobe and executive function deficit: Secondary | ICD-10-CM

## 2016-04-29 NOTE — Therapy (Signed)
[facility] [address] [place], [place], [address] Phone: [phone]   Fax:  [phone]  Occupational Therapy Treatment  Patient Details  Name: [name] [name] MRN: [id number] Date of Birth: [birth year] Referring Provider: Dr. Sanda Linger  Encounter Date: 04/29/2016      OT End of Session - 04/29/16 1612    Visit Number 5   Number of Visits 16   Date for OT Re-Evaluation 06/03/16   Authorization Type medicare- will need g code and progress note every 10th visit   Authorization Time Period 60 days   Authorization - Visit Number 5   Authorization - Number of Visits 10   OT Start Time 1230   OT Stop Time 1315   OT Time Calculation (min) 45 min   Activity Tolerance Patient tolerated treatment well   Behavior During Therapy [facility] for tasks assessed/performed      Past Medical History:  Diagnosis Date  . Chronic combined systolic and diastolic CHF, NYHA class 3 (HCC)    a. 03/2013 Echo: EF 25-30%, diff HK, sev antsept HK, mild MR.  . Congestive dilated cardiomyopathy (HCC)    a. 03/2013 Echo: EF 25-30%;  b. 09/2013 s/p SJM 3242 CRT-P.  Marland Kitchen Coronary artery disease    a. 08/2012 Cath: LM nl, LAD 62m, LCX nondom, mild-mod nonobs dzs mid, OM1 ok, OM2 90/90p, RCA 40, EF 25-30%-->Med Rx.  . GERD (gastroesophageal reflux disease)   . High cholesterol   . Hypertension   . Hypothyroidism   . Left bundle branch block   . Osteoarthritis   . Stroke (HCC)   . Type II diabetes mellitus (HCC)     Past Surgical History:  Procedure Laterality Date  . ABDOMINAL HYSTERECTOMY  1980  . BI-VENTRICULAR PACEMAKER INSERTION N/A 10/01/2013   Procedure: BI-VENTRICULAR PACEMAKER INSERTION (CRT-P);  Surgeon: Duke Salvia, MD;  Location[facility] CATH LAB;  Service: Cardiovascular;  Laterality: N/A;  . BI-VENTRICULAR PACEMAKER INSERTION (CRT-P)     a. 09/2013 s/p SJM 3242 CRT-P.  . Bilateral cateract surgery    . BMD  2006  . ORIF TIBIA PLATEAU  Right 12/04/2013   Procedure: OPEN REDUCTION INTERNAL FIXATION (ORIF) TIBIAL PLATEAU;  Surgeon: Verlee Rossetti, MD;  Location: [facility];  Service: Orthopedics;  Laterality: Right;  . RIGHT HEART CATHETERIZATION N/A 09/08/2012   Procedure: RIGHT HEART CATH;  Surgeon: Dolores Patty, MD;  Location: [facility] CATH LAB;  Service: Cardiovascular;  Laterality: N/A;    There were no vitals filed for this visit.      Subjective Assessment - 04/29/16 1604    Subjective  [place]   Patient is accompained by: Family member   Pertinent History see epic snapshot, CVA in 12/2015, diabetic neuropathy   Patient Stated Goals I want better energy   Currently in Pain? No/denies   Multiple Pain Sites No            [facility] OT Assessment - 04/29/16 1610      Coordination   Right 9 Hole Peg Test 51.12   Left 9 Hole Peg Test 1.00     Hand Function   Left Hand Grip (lbs) 35                  OT Treatments/Exercises (OP) - 04/29/16 1604      Fine Motor Coordination   Other Fine Motor Exercises Patient given exercises to complete at home to  improve fine motor coordination in hands.  Patient with essential tremor in right hand, and mild incoordination due to stroke - left hand.       Visual/Perceptual Exercises   Copy this Image Pegboard   Other Exercises Worked on copyin pattern on large pegboard to encourage scanning in left fields.  Patient continues with left neglect, but now can occasionally spontaneously seek info to left of midline with increased time.  Initially patient required mod cueing for accuracy, but with repetition, able to effectively complete with questioning cues. Patient's son present for session today.               Balance Exercises - 04/29/16 1418      Balance Exercises: Standing   Standing Eyes Opened Foam/compliant surface;4 reps;Wide Hadley Pen)           OT Education - 04/29/16 1612    Education provided Yes    Education Details HEP coordiantion hands   Person(s) Educated Patient;Child(ren)   Methods Explanation;Demonstration;Verbal cues;Handout   Comprehension Verbalized understanding;Returned demonstration;Need further instruction          OT Short Term Goals - 04/29/16 1614      OT SHORT TERM GOAL #1   Title Pt and family will be mod I with home activities program - 05/06/2016   Status On-going     OT SHORT TERM GOAL #2   Title Pt will need  min vc's for table top scanning activities with functional task    Status On-going     OT SHORT TERM GOAL #3   Title Pt will be supervision for simple hot meal prep making familiar hot meal   Status On-going     OT SHORT TERM GOAL #4   Title Pt will increase grip strength by at least 3 pounds to assist in functional tasks (baseline = 30 pounds)   Status Achieved  35 lbs - left     OT SHORT TERM GOAL #5   Title Pt will be mod I with bathing at shower level   Status On-going           OT Long Term Goals - 04/24/16 1635      OT LONG TERM GOAL #1   Title Pt and family will be mod I with home activities program - 06/03/2016   Status On-going     OT LONG TERM GOAL #2   Title Pt will be min vc's for environmental scanning with familiar functional tasks   Status On-going     OT LONG TERM GOAL #3   Title Pt will increase grip strength in LUE by 5 pounds to assist with functioal activities (baseline= 30)   Status On-going     OT LONG TERM GOAL #4   Title Pt will tolerate 25 minutes of ambulatory activity with no more than 1 rest break   Status On-going     OT LONG TERM GOAL #5   Title Pt will be mod I with shower transfers   Status On-going               Plan - 04/29/16 1613    Clinical Impression Statement Patient with improving sustained attention, and improved ability to engage and participate in rehab process. Patient with significantly depleted activity tolerance,a nd significant cognitive, visual perceptual skills.      Rehab Potential Good   OT Frequency 2x / week   OT Duration 8 weeks   OT Treatment/Interventions Self-care/ADL training;Moist Heat;DME and/or AE instruction;Energy conservation;Neuromuscular education;Therapeutic  exercise;Building services engineerunctional Mobility Training;Therapeutic activities;Balance training;Patient/family education;Visual/perceptual remediation/compensation;Cognitive remediation/compensation   Plan HEP for grip strength, balance, laundry   Consulted and Agree with Plan of Care Patient;Family member/caregiver   Family Member Consulted son Selena BattenKim      Patient will benefit from skilled therapeutic intervention in order to improve the following deficits and impairments:  Cardiopulmonary status limiting activity, Decreased activity tolerance, Decreased balance, Decreased cognition, Decreased knowledge of use of DME, Decreased mobility, Decreased safety awareness, Difficulty walking, Decreased strength, Impaired UE functional use, Impaired sensation, Impaired vision/preception  Visit Diagnosis: Unsteadiness on feet  Hemiplegia and hemiparesis following unspecified cerebrovascular disease affecting left non-dominant side (HCC)  Visuospatial deficit  Frontal lobe and executive function deficit  Other disturbances of skin sensation  Muscle weakness (generalized)    Problem List Patient Active Problem List   Diagnosis Date Noted  . Diabetic neuropathy, painful (HCC) 04/24/2016  . Left carotid artery stenosis   . Hypertriglyceridemia, essential 01/10/2016  . 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 Type 2 diabetes mellitus with diabetic neuropathy, with long-term current use of insulin (HCC) 10/06/2015  . Unspecified deficiency anemia 03/10/2014  . Osteoporosis, unspecified 03/10/2014  . Left bundle branch block 07/01/2013  . Coronary atherosclerosis of native coronary artery 02/16/2013  . Chronic systolic heart failure (HCC) 08/16/2012  .  Congestive dilated cardiomyopathy (HCC) 08/02/2012  . HTN (hypertension) 08/02/2012  . Familial tremor 11/29/2010  . VITAMIN D DEFICIENCY 04/04/2010  . Osteoarthrosis, unspecified whether generalized or localized, unspecified site 04/04/2010  . Hyperlipidemia with target LDL less than 70 01/19/2007  . Hypothyroidism 01/14/2007    Collier SalinaGellert, Kristin M, OTR/L 04/29/2016, 4:16 PM  [facility] [address] [place], [place], [address] Phone: [phone]   Fax:  [phone]  Name: [name] [name] MRN: [id number] Date of Birth: 08/24/1933

## 2016-04-29 NOTE — Patient Instructions (Signed)
  Coordination Activities  Perform the following activities for 5 minutes 1-2 times per day with both hand(s).   Flip cards 1 at a time as fast as you can.  Deal cards with your thumb (Hold deck in hand and push card off top with thumb).  Rotate card in hand (clockwise and counter-clockwise).  Shuffle cards.  Pick up coins, buttons, marbles, dried beans/pasta of different sizes and place in container.  Pick up coins and stack.  Pick up coins one at a time until you get 5-10 in your hand, then move coins from palm to fingertips to stack one at a time.  Twirl pen between fingers.

## 2016-04-29 NOTE — Therapy (Signed)
[facility] [address] [place], [place], [address] Phone: [phone]   Fax:  [phone]  Physical Therapy Treatment  Patient Details  Name: [name] [name] MRN: [id number] Date of Birth: March 26, 1934 Referring Provider: Sanda Linger, MD PCP (Dr. Pearlean Brownie, neurologist)  Encounter Date: 04/29/2016      PT End of Session - 04/29/16 1428    Visit Number 6   Number of Visits 18   Date for PT Re-Evaluation 05/31/16   Authorization Type Medicare G-code, TRICARE   PT Start Time 1315   PT Stop Time 1400   PT Time Calculation (min) 45 min   Equipment Utilized During Treatment Gait belt   Activity Tolerance Patient tolerated treatment well;Patient limited by fatigue   Behavior During Therapy [facility] for tasks assessed/performed      Past Medical History:  Diagnosis Date  . Chronic combined systolic and diastolic CHF, NYHA class 3 (HCC)    a. 03/2013 Echo: EF 25-30%, diff HK, sev antsept HK, mild MR.  . Congestive dilated cardiomyopathy (HCC)    a. 03/2013 Echo: EF 25-30%;  b. 09/2013 s/p SJM 3242 CRT-P.  Marland Kitchen Coronary artery disease    a. 08/2012 Cath: LM nl, LAD 21m, LCX nondom, mild-mod nonobs dzs mid, OM1 ok, OM2 90/90p, RCA 40, EF 25-30%-->Med Rx.  . GERD (gastroesophageal reflux disease)   . High cholesterol   . Hypertension   . Hypothyroidism   . Left bundle branch block   . Osteoarthritis   . Stroke (HCC)   . Type II diabetes mellitus (HCC)     Past Surgical History:  Procedure Laterality Date  . ABDOMINAL HYSTERECTOMY  1980  . BI-VENTRICULAR PACEMAKER INSERTION N/A 10/01/2013   Procedure: BI-VENTRICULAR PACEMAKER INSERTION (CRT-P);  Surgeon: Duke Salvia, MD;  Location: [facility] CATH LAB;  Service: Cardiovascular;  Laterality: N/A;  . BI-VENTRICULAR PACEMAKER INSERTION (CRT-P)     a. 09/2013 s/p SJM 3242 CRT-P.  . Bilateral cateract surgery    . BMD  2006  . ORIF TIBIA PLATEAU Right 12/04/2013   Procedure: OPEN REDUCTION  INTERNAL FIXATION (ORIF) TIBIAL PLATEAU;  Surgeon: Verlee Rossetti, MD;  Location: [facility] ORS;  Service: Orthopedics;  Laterality: Right;  . RIGHT HEART CATHETERIZATION N/A 09/08/2012   Procedure: RIGHT HEART CATH;  Surgeon: Dolores Patty, MD;  Location: [facility] CATH LAB;  Service: Cardiovascular;  Laterality: N/A;    There were no vitals filed for this visit.      Subjective Assessment - 04/29/16 1317    Subjective Pt reports no falls since last visit and no issues or problems when she went to a funeral over the weekend.   Patient is accompained by: Family member   Limitations Standing;Walking;Lifting   Patient Stated Goals To walk and balance better. Build stamina.    Currently in Pain? No/denies   Multiple Pain Sites No                         [facility] Adult PT Treatment/Exercise - 04/29/16 1315      Transfers   Transfers Sit to Stand;Stand to [employer] Transfers   Sit to Stand 6: Modified independent (Device/Increase time);With upper extremity assist;With armrests;From chair/3-in-1   Stand to Sit 6: Modified independent (Device/Increase time);With upper extremity assist;With armrests;To chair/3-in-1   Stand Pivot Transfers 6: Modified independent (Device/Increase time);With armrests     Ambulation/Gait   Ambulation/Gait Yes   Ambulation/Gait Assistance 4: Min guard   Ambulation/Gait Assistance  Details Verbal and tactile cuing to cue pt to scan to the Lt when ambulating to find doors, obstacles, ect. while maintaining same gait speed.   Ambulation Distance (Feet) 400 Feet  1x400 ft indoors and outdoors, 1x250 ft,  2x17900ft   Assistive device Straight cane   Gait Pattern Step-through pattern;Decreased arm swing - left;Decreased step length - left;Decreased stance time - left;Decreased hip/knee flexion - left;Left flexed knee in stance;Trunk flexed;Poor foot clearance - left   Ambulation Surface Level;Unlevel;Indoor;Outdoor;Paved   Stairs Yes   Stairs Assistance 5:  Supervision   Stairs Assistance Details (indicate cue type and reason) Verbal and visual cues for hand and foot palcement and to get pt to perform reciprocal pattern of stepping.  Initially, pt demonstrated a step to pattern when ascending/descending, however, performed reciprocal pattern with visual and verbal cuing using yard stick.  Pt used only R UE x2 reps and only L UE x2 resps to simulate home environment.   Stair Management Technique One rail Right;One rail Left;Alternating pattern;Step to pattern   Number of Stairs 4  2 reps on each side     High Level Balance   High Level Balance Activities Negotitating around obstacles;Negotiating over obstacles;Figure 8 turns   High Level Balance Comments Verbal cues for directional instructions and to locate obstacles during obstacle course             Balance Exercises - 04/29/16 1418      Balance Exercises: Standing   Standing Eyes Opened Foam/compliant surface;4 reps;Wide (BOA)     Pt standing on Airex with feet apart at countertop reaching across body unilaterally to reach cone and stack on opposite side to improve standing balance while multi tasking and forcing pt to look to the Lt to improve Lt side neglect.  Performed  6 reps with each arm.  Also performed cone stacking from counter to  Overhead shelf x6 using Rt and Lt UE while on Airex.  Tactile & verbal cuing needed for sequencing and to prevent uninvolved side from aiding in balance by grabbing countertop.  Visual cuing using SPC to force pt to reach further to each side.       PT Education - 04/29/16 1424    Education provided Yes   Education Details Pt educated to scan environment especially to the left.   Person(s) Educated Patient   Methods Explanation;Verbal cues;Demonstration;Tactile cues   Comprehension Need further instruction          PT Short Term Goals - 04/11/16 1557      PT SHORT TERM GOAL #1   Title Patient with family cues demonstrates understanding of  [place] HEP. (Target Date: 05/08/2016)   Time 1   Period Months   Status On-going     PT SHORT TERM GOAL #2   Title Timed Up-Go <15sec with supervision. (Target Date: 05/08/2016)   Time 1   Period Months   Status On-going     PT SHORT TERM GOAL #3   Title Standing balance turning head to look to side and reaches 5" & to floor with supervision. (Target Date: 05/08/2016)   Time 1   Period Months   Status On-going     PT SHORT TERM GOAL #4   Title Patient ambulates 250' around obstacles with LRAD with supervision. (Target Date: 05/08/2016)   Time 1   Period Months   Status On-going     PT SHORT TERM GOAL #5   Title Cognitive TUG <25sec with simple naming task.  (  Target Date: 05/08/2016)   Time 1   Period Months   Status On-going           PT Long Term Goals - 04/11/16 1557      PT LONG TERM GOAL #1   Title Patient and family demonstrate & verbalize understanding of ongoing fitness plan & HEP. (Target Date: 05/31/2016)   Time 2   Period Months   Status On-going     PT LONG TERM GOAL #2   Title Timed Up-Go standard <13.5sec safely without device. (Target Date: 05/31/2016)   Time 2   Period Months   Status On-going     PT LONG TERM GOAL #3   Title Berg Balance >36/56 to indicate lower fall risk. (Target Date: 05/31/2016)   Time 2   Period Months   Status On-going     PT LONG TERM GOAL #4   Title Dynamic Gait Index >/= 10/24 to indicate lower fall risk. (Target Date: 05/31/2016)   Time 2   Period Months   Status On-going     PT LONG TERM GOAL #5   Title Patient ambulates 500' with LRAD with supervision for cognitive deficits no balance losses safely. (Target Date: 05/31/2016)   Time 2   Period Months   Status On-going     PT LONG TERM GOAL #6   Title Patient negotiates ramps, curbs & stairs (1 rail) with LRAD with supervision from family safely. (Target Date: 05/31/2016)   Time 2   Period Months   Status On-going     PT LONG TERM GOAL #7   Title Patient  ambulates around furniture with LRAD modified independent for household mobility. (Target Date: 05/31/2016)   Time 2   Period Months   Status On-going               Plan - 04/29/16 1429    Clinical Impression Statement Pt continues to require extensive cuing to scan her environment to the left in order to avoid obstacles and negotiate her environment.  She tolerated ambulation outdoors well with minor loss of balance due to neglect to the Lt side and fatigue that prevents her from ambulating further distances.  She showed improvement with negotiating stairs as evidenced by her reciprocal stepping pattern although she required verbal and visual cuing to accomplish.  Pt continues to demonstrate deficits in strength, activity tolerance, balance, and Lt side neglect to be addressed in future PT sessions.   Rehab Potential Good   PT Frequency 2x / week   PT Duration Other (comment)   PT Treatment/Interventions ADLs/Self Care Home Management;DME Instruction;Gait training;Stair training;Functional mobility training;Therapeutic activities;Therapeutic exercise;Balance training;Neuromuscular re-education;Patient/family education;Orthotic Fit/Training;Vestibular   PT Next Visit Plan cont balance and gait training   Consulted and Agree with Plan of Care Patient;Family member/caregiver   Family Member Consulted son      Patient will benefit from skilled therapeutic intervention in order to improve the following deficits and impairments:  Abnormal gait, Decreased activity tolerance, Decreased balance, Decreased coordination, Decreased endurance, Decreased mobility, Decreased safety awareness, Decreased strength, Impaired vision/preception, Postural dysfunction  Visit Diagnosis: Unsteadiness on feet  Visuospatial deficit  Muscle weakness (generalized)  Other abnormalities of gait and mobility     Problem List Patient Active Problem List   Diagnosis Date Noted  . Diabetic neuropathy,  painful (HCC) 04/24/2016  . Left carotid artery stenosis   . Hypertriglyceridemia, essential 01/10/2016  . 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 Type  2 diabetes mellitus with diabetic neuropathy, with long-term current use of insulin (HCC) 10/06/2015  . Unspecified deficiency anemia 03/10/2014  . Osteoporosis, unspecified 03/10/2014  . Left bundle branch block 07/01/2013  . Coronary atherosclerosis of native coronary artery 02/16/2013  . Chronic systolic heart failure (HCC) 08/16/2012  . Congestive dilated cardiomyopathy (HCC) 08/02/2012  . HTN (hypertension) 08/02/2012  . Familial tremor 11/29/2010  . VITAMIN D DEFICIENCY 04/04/2010  . Osteoarthrosis, unspecified whether generalized or localized, unspecified site 04/04/2010  . Hyperlipidemia with target LDL less than 70 01/19/2007  . Hypothyroidism 01/14/2007    Vilinda Flake, SPT 04/29/2016, 2:36 PM  [facility] [address] [place], [place], [address] Phone: [phone]   Fax:  [phone]  Name: Delois Silvester MRN: [id number] Date of Birth: April 07, 1934

## 2016-05-01 ENCOUNTER — Encounter: Payer: Self-pay | Admitting: Physical Therapy

## 2016-05-01 ENCOUNTER — Ambulatory Visit: Payer: Medicare Other | Admitting: Physical Therapy

## 2016-05-01 DIAGNOSIS — R41842 Visuospatial deficit: Secondary | ICD-10-CM

## 2016-05-01 DIAGNOSIS — R2681 Unsteadiness on feet: Secondary | ICD-10-CM | POA: Diagnosis not present

## 2016-05-01 DIAGNOSIS — I69954 Hemiplegia and hemiparesis following unspecified cerebrovascular disease affecting left non-dominant side: Secondary | ICD-10-CM | POA: Diagnosis not present

## 2016-05-01 DIAGNOSIS — R2689 Other abnormalities of gait and mobility: Secondary | ICD-10-CM

## 2016-05-01 DIAGNOSIS — M6281 Muscle weakness (generalized): Secondary | ICD-10-CM | POA: Diagnosis not present

## 2016-05-01 DIAGNOSIS — R29818 Other symptoms and signs involving the nervous system: Secondary | ICD-10-CM | POA: Diagnosis not present

## 2016-05-01 NOTE — Therapy (Signed)
[facility] [address] [place], [place], [address] Phone: [phone]   Fax:  [phone]  Physical Therapy Treatment  Patient Details  Name: [name] [name] MRN: [id number] Date of Birth: [birth year] Referring Provider: Sanda Linger, MD PCP (Dr. Pearlean Brownie, neurologist)  Encounter Date: 05/01/2016      PT End of Session - 05/01/16 1603    Visit Number 7   Number of Visits 18   Date for PT Re-Evaluation 05/31/16   Authorization Type Medicare G-code, TRICARE   PT Start Time 1230   PT Stop Time 1315   PT Time Calculation (min) 45 min   Equipment Utilized During Treatment Gait belt   Activity Tolerance Patient tolerated treatment well;Patient limited by fatigue   Behavior During Therapy [facility] for tasks assessed/performed      Past Medical History:  Diagnosis Date  . Chronic combined systolic and diastolic CHF, NYHA class 3 (HCC)    a. 03/2013 Echo: EF 25-30%, diff HK, sev antsept HK, mild MR.  . Congestive dilated cardiomyopathy (HCC)    a. 03/2013 Echo: EF 25-30%;  b. 09/2013 s/p SJM 3242 CRT-P.  Marland Kitchen Coronary artery disease    a. 08/2012 Cath: LM nl, LAD 48m, LCX nondom, mild-mod nonobs dzs mid, OM1 ok, OM2 90/90p, RCA 40, EF 25-30%-->Med Rx.  . GERD (gastroesophageal reflux disease)   . High cholesterol   . Hypertension   . Hypothyroidism   . Left bundle branch block   . Osteoarthritis   . Stroke (HCC)   . Type II diabetes mellitus (HCC)     Past Surgical History:  Procedure Laterality Date  . ABDOMINAL HYSTERECTOMY  1980  . BI-VENTRICULAR PACEMAKER INSERTION N/A 10/01/2013   Procedure: BI-VENTRICULAR PACEMAKER INSERTION (CRT-P);  Surgeon: Duke Salvia, MD;  Location[facility] CATH LAB;  Service: Cardiovascular;  Laterality: N/A;  . BI-VENTRICULAR PACEMAKER INSERTION (CRT-P)     a. 09/2013 s/p SJM 3242 CRT-P.  . Bilateral cateract surgery    . BMD  2006  . ORIF TIBIA PLATEAU Right 12/04/2013   Procedure: OPEN REDUCTION  INTERNAL FIXATION (ORIF) TIBIAL PLATEAU;  Surgeon: Verlee Rossetti, MD;  Location: [facility] ORS;  Service: Orthopedics;  Laterality: Right;  . RIGHT HEART CATHETERIZATION N/A 09/08/2012   Procedure: RIGHT HEART CATH;  Surgeon: Dolores Patty, MD;  Location: [facility] CATH LAB;  Service: Cardiovascular;  Laterality: N/A;    There were no vitals filed for this visit.      Subjective Assessment - 05/01/16 1230    Subjective Pt reports staggering and "bumping in to things" when she walks.  No falls reported.     Limitations Standing;Walking;Lifting   Patient Stated Goals To walk and balance better. Build stamina.    Currently in Pain? No/denies                         [facility] Adult PT Treatment/Exercise - 05/01/16 1230      Transfers   Transfers Sit to Stand;Stand to [employer] Transfers   Sit to Stand 6: Modified independent (Device/Increase time)   Stand to Sit 6: Modified independent (Device/Increase time)   Stand Pivot Transfers 6: Modified independent (Device/Increase time)     Ambulation/Gait   Ambulation/Gait Yes   Ambulation/Gait Assistance 4: Min guard   Ambulation/Gait Assistance Details Verbal and tactile cues to maintain balance when scanning environment, especially to the Lt, while encouraging pt to maintain the same gait speed while turning head and performing  cognitive tasks.   Ambulation Distance (Feet) 400 Feet  1x485ft, 1x229ft, 1x117ft, and 2x24ft   Assistive device Straight cane   Gait Pattern Step-through pattern;Decreased arm swing - left;Decreased step length - left;Decreased stance time - right;Decreased hip/knee flexion - right;Decreased hip/knee flexion - left;Trunk flexed;Poor foot clearance - left   Ambulation Surface Level;Unlevel;Indoor;Outdoor;Paved   Ramp 6: Modified independent (Device)     High Level Balance   High Level Balance Activities Backward walking;Side stepping;Negotitating around obstacles;Negotiating over obstacles;Marching  forwards   High Level Balance Comments Verbal and tactile cuing needed to encourage pt to scan environment to find obstacles, especially to the Lt, when performing obstacle course.  Verbal cuing for sequencing during obstacle course, static standing on airex, marching on airex, backeard walking, and side stepping.             Balance Exercises - 05/01/16 1230      Balance Exercises: Standing   Standing Eyes Opened Wide (BOA);Head turns;Foam/compliant surface;30 secs   Retro Gait Upper extremity support  intermittent   Sidestepping Upper extremity support  intermittent   Overall Comments Retro;Intermittent upper extremity support           PT Education - 05/01/16 1602    Education provided Yes   Education Details Pt educated to be aware of surroundings when ambulating, especially to the Lt.   Person(s) Educated Patient   Methods Explanation;Demonstration;Verbal cues;Tactile cues   Comprehension Verbalized understanding;Returned demonstration;Need further instruction          PT Short Term Goals - 04/11/16 1557      PT SHORT TERM GOAL #1   Title Patient with family cues demonstrates understanding of [place] HEP. (Target Date: 05/08/2016)   Time 1   Period Months   Status On-going     PT SHORT TERM GOAL #2   Title Timed Up-Go <15sec with supervision. (Target Date: 05/08/2016)   Time 1   Period Months   Status On-going     PT SHORT TERM GOAL #3   Title Standing balance turning head to look to side and reaches 5" & to floor with supervision. (Target Date: 05/08/2016)   Time 1   Period Months   Status On-going     PT SHORT TERM GOAL #4   Title Patient ambulates 250' around obstacles with LRAD with supervision. (Target Date: 05/08/2016)   Time 1   Period Months   Status On-going     PT SHORT TERM GOAL #5   Title Cognitive TUG <25sec with simple naming task.  (Target Date: 05/08/2016)   Time 1   Period Months   Status On-going           PT Long Term Goals -  04/11/16 1557      PT LONG TERM GOAL #1   Title Patient and family demonstrate & verbalize understanding of ongoing fitness plan & HEP. (Target Date: 05/31/2016)   Time 2   Period Months   Status On-going     PT LONG TERM GOAL #2   Title Timed Up-Go standard <13.5sec safely without device. (Target Date: 05/31/2016)   Time 2   Period Months   Status On-going     PT LONG TERM GOAL #3   Title Berg Balance >36/56 to indicate lower fall risk. (Target Date: 05/31/2016)   Time 2   Period Months   Status On-going     PT LONG TERM GOAL #4   Title Dynamic Gait Index >/= 10/24 to indicate lower fall risk. (  Target Date: 05/31/2016)   Time 2   Period Months   Status On-going     PT LONG TERM GOAL #5   Title Patient ambulates 500' with LRAD with supervision for cognitive deficits no balance losses safely. (Target Date: 05/31/2016)   Time 2   Period Months   Status On-going     PT LONG TERM GOAL #6   Title Patient negotiates ramps, curbs & stairs (1 rail) with LRAD with supervision from family safely. (Target Date: 05/31/2016)   Time 2   Period Months   Status On-going     PT LONG TERM GOAL #7   Title Patient ambulates around furniture with LRAD modified independent for household mobility. (Target Date: 05/31/2016)   Time 2   Period Months   Status On-going               Plan - 05/01/16 1603    Clinical Impression Statement Pt performed incr ambulation during this session including incr distances that challenged pt's endurance level and ability to constantly scan the environment for obstacles and people.  Cognitive tasks were included during ambultion, both outdoors and indoors, to promote similar ambulation quality when multitasking.  Pt was also able to tolerate higher levle balancing exercises designed to challenge her ability to perform static balance with both her vestibular and somatosensory systems at a disadvantage.  She continues to demonstrate deficits in functional  balance deficits that should continue to be addressed with skilled PT.   Rehab Potential Good   PT Frequency 2x / week   PT Duration Other (comment)   PT Treatment/Interventions ADLs/Self Care Home Management;DME Instruction;Gait training;Stair training;Functional mobility training;Therapeutic activities;Therapeutic exercise;Balance training;Neuromuscular re-education;Patient/family education;Orthotic Fit/Training;Vestibular   PT Next Visit Plan Assess STGs, Gait training with obstacles and cognitive tasks, balance training to challenge static standing   Consulted and Agree with Plan of Care Patient;Family member/caregiver      Patient will benefit from skilled therapeutic intervention in order to improve the following deficits and impairments:  Abnormal gait, Decreased activity tolerance, Decreased balance, Decreased coordination, Decreased endurance, Decreased mobility, Decreased safety awareness, Decreased strength, Impaired vision/preception, Postural dysfunction  Visit Diagnosis: Unsteadiness on feet  Other abnormalities of gait and mobility  Visuospatial deficit  Hemiplegia and hemiparesis following unspecified cerebrovascular disease affecting left non-dominant side [facility])     Problem List Patient Active Problem List   Diagnosis Date Noted  . Diabetic neuropathy, painful (HCC) 04/24/2016  . Left carotid artery stenosis   . Hypertriglyceridemia, essential 01/10/2016  . 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 Type 2 diabetes mellitus with diabetic neuropathy, with long-term current use of insulin (HCC) 10/06/2015  . Unspecified deficiency anemia 03/10/2014  . Osteoporosis, unspecified 03/10/2014  . Left bundle branch block 07/01/2013  . Coronary atherosclerosis of native coronary artery 02/16/2013  . Chronic systolic heart failure (HCC) 08/16/2012  . Congestive dilated cardiomyopathy (HCC) 08/02/2012  . HTN (hypertension)  08/02/2012  . Familial tremor 11/29/2010  . VITAMIN D DEFICIENCY 04/04/2010  . Osteoarthrosis, unspecified whether generalized or localized, unspecified site 04/04/2010  . Hyperlipidemia with target LDL less than 70 01/19/2007  . Hypothyroidism 01/14/2007    Vilinda Flake, SPT 05/01/2016, 4:14 PM  Vladimir Faster, PT, DPT PT Specializing in Prosthetics & Orthotics 05/01/16 4:18 PM Phone:  [phone]  Fax:  [phone] [facility] [address] [place], [place] [address]  [facility] [address]  [place], [place], [address] Phone: [phone]   Fax:  [phone]  Name: [name] [name] MRN: [id number] Date of Birth: 08/29/1933

## 2016-05-06 ENCOUNTER — Other Ambulatory Visit: Payer: Self-pay | Admitting: Internal Medicine

## 2016-05-08 ENCOUNTER — Ambulatory Visit: Payer: Medicare Other | Admitting: Physical Therapy

## 2016-05-08 ENCOUNTER — Ambulatory Visit: Payer: Medicare Other | Admitting: Occupational Therapy

## 2016-05-08 ENCOUNTER — Encounter: Payer: Self-pay | Admitting: Physical Therapy

## 2016-05-08 DIAGNOSIS — M6281 Muscle weakness (generalized): Secondary | ICD-10-CM | POA: Diagnosis not present

## 2016-05-08 DIAGNOSIS — R2689 Other abnormalities of gait and mobility: Secondary | ICD-10-CM | POA: Diagnosis not present

## 2016-05-08 DIAGNOSIS — R208 Other disturbances of skin sensation: Secondary | ICD-10-CM

## 2016-05-08 DIAGNOSIS — R2681 Unsteadiness on feet: Secondary | ICD-10-CM

## 2016-05-08 DIAGNOSIS — R29818 Other symptoms and signs involving the nervous system: Secondary | ICD-10-CM | POA: Diagnosis not present

## 2016-05-08 DIAGNOSIS — I69954 Hemiplegia and hemiparesis following unspecified cerebrovascular disease affecting left non-dominant side: Secondary | ICD-10-CM

## 2016-05-08 DIAGNOSIS — R41842 Visuospatial deficit: Secondary | ICD-10-CM | POA: Diagnosis not present

## 2016-05-08 DIAGNOSIS — R41844 Frontal lobe and executive function deficit: Secondary | ICD-10-CM

## 2016-05-08 NOTE — Therapy (Signed)
[facility] [address] [place], [place], [address] Phone: [phone]   Fax:  [phone]  Physical Therapy Treatment  Patient Details  Name: [name] [name] MRN: [id number] Date of Birth: 19-Jun-1934 Referring Provider: Scarlette Calico, MD PCP (Dr. Leonie Man, neurologist)  Encounter Date: 05/08/2016      PT End of Session - 05/08/16 1627    Visit Number 8   Number of Visits 18   Date for PT Re-Evaluation 05/31/16   Authorization Type Medicare G-code, TRICARE   PT Start Time 1400   PT Stop Time 1445   PT Time Calculation (min) 45 min   Equipment Utilized During Treatment Gait belt   Activity Tolerance Patient tolerated treatment well;Patient limited by fatigue   Behavior During Therapy [facility] for tasks assessed/performed      Past Medical History:  Diagnosis Date  . Chronic combined systolic and diastolic CHF, NYHA class 3 ([place])    a. 03/2013 Echo: EF 25-30%, diff HK, sev antsept HK, mild MR.  . Congestive dilated cardiomyopathy ([place])    a. 03/2013 Echo: EF 25-30%;  b. 09/2013 s/p SJM 3242 CRT-P.  Marland Kitchen Coronary artery disease    a. 08/2012 Cath: LM nl, LAD 8m LCX nondom, mild-mod nonobs dzs mid, OM1 ok, OM2 90/90p, RCA 40, EF 25-30%-->Med Rx.  . GERD (gastroesophageal reflux disease)   . High cholesterol   . Hypertension   . Hypothyroidism   . Left bundle branch block   . Osteoarthritis   . Stroke ([place]   . Type II diabetes mellitus ([place]     Past Surgical History:  Procedure Laterality Date  . ABDOMINAL HYSTERECTOMY  1980  . BI-VENTRICULAR PACEMAKER INSERTION N/A 10/01/2013   Procedure: BI-VENTRICULAR PACEMAKER INSERTION (CRT-P);  Surgeon: SDeboraha Sprang MD;  [facility];  Service: Cardiovascular;  Laterality: N/A;  . BI-VENTRICULAR PACEMAKER INSERTION (CRT-P)     a. 09/2013 s/p SJM 3242 CRT-P.  . Bilateral cateract surgery    . BMD  2006  . ORIF TIBIA PLATEAU Right 12/04/2013   Procedure: OPEN REDUCTION  INTERNAL FIXATION (ORIF) TIBIAL PLATEAU;  Surgeon: SAugustin Schooling MD;  Location: [facility];  Service: Orthopedics;  Laterality: Right;  . RIGHT HEART CATHETERIZATION N/A 09/08/2012   Procedure: RIGHT HEART CATH;  Surgeon: DJolaine Artist MD;  [facility];  Service: Cardiovascular;  Laterality: N/A;    There were no vitals filed for this visit.      Subjective Assessment - 05/08/16 1412    Subjective Pt reports no issues or falls since last visit. "I'm not doing any worse and not doing any better."   Patient is accompained by: Family member   Limitations Standing;Walking;Lifting   Patient Stated Goals To walk and balance better. Build stamina.    Currently in Pain? No/denies                         [facility] - 05[facility]16 1400      Transfers   Transfers Sit to Stand;Stand to SLockheed MartinTransfers   Sit to Stand 6: Modified independent (Device/Increase time);With upper extremity assist;With armrests;From chair/3-in-1   Stand to Sit 6: Modified independent (Device/Increase time);With upper extremity assist;To chair/3-in-1   Stand Pivot Transfers 6: Modified independent (Device/Increase time);With armrests     Ambulation/Gait   Ambulation/Gait Yes   Ambulation/Gait Assistance 4: Min guard   Ambulation/Gait Assistance Details Verbal cues to encourage pt to scan then  environment, especially to the Lt while maintaining gait speed.   Ambulation Distance (Feet) 250 Feet  1x235f, 2x872f 2x3015f Assistive device Straight cane   Gait Pattern Step-through pattern;Decreased arm swing - left;Decreased stride length;Decreased hip/knee flexion - left;Decreased hip/knee flexion - right;Shuffle;Trunk flexed;Narrow base of support;Poor foot clearance - left   Ambulation Surface Level;Indoor     Neuro Re-ed    Neuro Re-ed Details  Pt performed dynamic standing activities to promote large amplitude movements that will enable her to improve gait  and dymanic balance.  Trunk and knees flexed leading to upright trunk and ext knees with B UE abducted with shoulders retracted x10 reps; trunk and hip rotation with weight shift bilaterally.  Verbal and demo cuing needed for sequencing and proper form x10 reps.             Balance Exercises - 05/08/16 1611      OTAGO PROGRAM   Back Extension Standing;5 reps   Trunk Movements Standing;5 reps   Ankle Movements 10 reps;Sitting   Knee Extensor 10 reps   Knee Flexor 10 reps  standing at counter top   Hip ABductor 10 reps  standing at counter top   Ankle Plantorflexors 20 reps, no support           PT Education - 05/08/16 1622    Education provided Yes   Education Details Pt educated and reminded on proper form with OTAGO exercises nd encourged to perform at home.   Person(s) Educated Patient   Methods Explanation;Demonstration;Other (comment)   Comprehension Verbalized understanding;Returned demonstration          PT Short Term Goals - 05/08/16 1413      PT SHORT TERM GOAL #1   Title Patient with family cues demonstrates understanding of [place]. (Target Date: 05/08/2016)   Baseline GOAL MET: 05/08/16   Time 1   Period Months   Status Achieved     PT SHORT TERM GOAL #2   Title Timed Up-Go <15sec with supervision. (Target Date: 05/08/2016)   Baseline TUG: 18.87sec; 05/08/16   Time 1   Period Months   Status On-going     PT SHORT TERM GOAL #3   Title Standing balance turning head to look to side and reaches 5" & to floor with supervision. (Target Date: 05/08/2016)   Baseline GOAL MET: 05/08/16   Time 1   Period Months   Status Achieved     PT SHORT TERM GOAL #4   Title Patient ambulates 250' around obstacles with LRAD with supervision. (Target Date: 05/08/2016)   Baseline GOAL MET: 05/08/16   Time 1   Period Months   Status Achieved     PT SHORT TERM GOAL #5   Title Cognitive TUG <25sec with simple naming task.  (Target Date: 05/08/2016)   Baseline Cog  TUG: 45.34sec; 05/08/16   Time 1   Period Months   Status On-going           PT Long Term Goals - 04/11/16 1557      PT LONG TERM GOAL #1   Title Patient and family demonstrate & verbalize understanding of ongoing fitness plan & HEP. (Target Date: 05/31/2016)   Time 2   Period Months   Status On-going     PT LONG TERM GOAL #2   Title Timed Up-Go standard <13.5sec safely without device. (Target Date: 05/31/2016)   Time 2   Period Months   Status On-going     PT LONG TERM  GOAL #3   Title Berg Balance >36/56 to indicate lower fall risk. (Target Date: 05/31/2016)   Time 2   Period Months   Status On-going     PT LONG TERM GOAL #4   Title Dynamic Gait Index >/= 10/24 to indicate lower fall risk. (Target Date: 05/31/2016)   Time 2   Period Months   Status On-going     PT LONG TERM GOAL #5   Title Patient ambulates 500' with LRAD with supervision for cognitive deficits no balance losses safely. (Target Date: 05/31/2016)   Time 2   Period Months   Status On-going     PT LONG TERM GOAL #6   Title Patient negotiates ramps, curbs & stairs (1 rail) with LRAD with supervision from family safely. (Target Date: 05/31/2016)   Time 2   Period Months   Status On-going     PT LONG TERM GOAL #7   Title Patient ambulates around furniture with LRAD modified independent for household mobility. (Target Date: 05/31/2016)   Time 2   Period Months   Status On-going               Plan - 05/08/16 1628    Clinical Impression Statement Pt demonstrated improvement today as she was able to achieve several of her STGs indicating an improvement in her overall functional status.  TUG times differed significantly between normal and cognitive TUG (Normal - 18.87 sec; Cog - 45.34 sec) indicating a need for improvement with gait and balance with cognitive tasks.  Pt performed well in exercises designed to promote large amplitude movments that would assist her in gait deviations observed  during the session and improve her functional balance.  She continues to require improvement in LE strength, endurance, and balance and would continue to benefit from skilled PT to address these issues.   Rehab Potential Good   PT Frequency 2x / week   PT Duration Other (comment)   PT Treatment/Interventions ADLs/Self Care Home Management;DME Instruction;Gait training;Stair training;Functional mobility training;Therapeutic activities;Therapeutic exercise;Balance training;Neuromuscular re-education;Patient/family education;Orthotic Fit/Training;Vestibular   PT Next Visit Plan Gait training with cognitive tasks, large amplitude movements   Consulted and Agree with Plan of Care Patient      Patient will benefit from skilled therapeutic intervention in order to improve the following deficits and impairments:  Abnormal gait, Decreased activity tolerance, Decreased balance, Decreased coordination, Decreased endurance, Decreased mobility, Decreased safety awareness, Decreased strength, Impaired vision/preception, Postural dysfunction  Visit Diagnosis: Unsteadiness on feet  Other abnormalities of gait and mobility  Muscle weakness (generalized)     Problem List Patient Active Problem List   Diagnosis Date Noted  . Diabetic neuropathy, painful ([place]) 04/24/2016  . Left carotid artery stenosis   . Hypertriglyceridemia, essential 01/10/2016  . Cerebral infarction due to thrombosis of right posterior cerebral artery ([place]) 01/10/2016  . Type 2 diabetes mellitus with diabetic neuropathy, with long-term current use of insulin ([place]) 10/06/2015  . Unspecified deficiency anemia 03/10/2014  . Osteoporosis, unspecified 03/10/2014  . Left bundle branch block 07/01/2013  . Coronary atherosclerosis of native coronary artery 02/16/2013  . Chronic systolic heart failure ([place]) 08/16/2012  . Congestive dilated cardiomyopathy ([place]) 08/02/2012  . HTN (hypertension) 08/02/2012  . Familial tremor 11/29/2010  .  VITAMIN D DEFICIENCY 04/04/2010  . Osteoarthrosis, unspecified whether generalized or localized, unspecified site 04/04/2010  . Hyperlipidemia with target LDL less than 70 01/19/2007  . Hypothyroidism 01/14/2007    [name] [name], SPT 05/08/2016, 4:55 PM  [name] [name]  [place], [place], [address] Phone: [phone]   Fax:  [phone]  Name: [name] [name] MRN: [id number] Date of Birth: 27-Apr-1934

## 2016-05-08 NOTE — Therapy (Signed)
[facility] [address] [place], [place], [address] Phone: [phone]   Fax:  [phone]  Occupational Therapy Treatment  Patient Details  Name: Tequila Rottmann MRN: [id number] Date of Birth: 08-03-1934 Referring Provider: Dr. Sanda Linger  Encounter Date: 05/08/2016      OT End of Session - 05/08/16 1604    Visit Number 6   Number of Visits 16   Date for OT Re-Evaluation 06/03/16   Authorization Type medicare- will need g code and progress note every 10th visit   Authorization Time Period 60 days   Authorization - Visit Number 6   Authorization - Number of Visits 10   OT Start Time 1450   OT Stop Time 1530   OT Time Calculation (min) 40 min   Activity Tolerance Patient tolerated treatment well   Behavior During Therapy [facility] for tasks assessed/performed      Past Medical History:  Diagnosis Date  . Chronic combined systolic and diastolic CHF, NYHA class 3 (H[facility])    a. 03/2013 Echo: EF 25-30%, diff HK, sev antsept HK, mild MR.  . Congestive dilated cardiomyopathy (HCC)    a. 03/2013 Echo: EF 25-30%;  b. 09/2013 s/p SJM 3242 CRT-P.  Marland Kitchen Coronary artery disease    a. 08/2012 Cath: LM nl, LAD 11m, LCX nondom, mild-mod nonobs dzs mid, OM1 ok, OM2 90/90p, RCA 40, EF 25-30%-->Med Rx.  . GERD (gastroesophageal reflux disease)   . High cholesterol   . Hypertension   . Hypothyroidism   . Left bundle branch block   . Osteoarthritis   . Stroke ([facility])   . Type II diabetes mellitus (HCC)     Past Surgical History:  Procedure Laterality Date  . ABDOMINAL HYSTERECTOMY  1980  . BI-VENTRICULAR PACEMAKER INSERTION N/A 10/01/2013   Procedure: BI-VENTRICULAR PACEMAKER INSERTION (CRT-P);  Surgeon: Duke Salvia, MD;  [facility] LAB;  Service: Cardiovascular;  Laterality: N/A;  . BI-VENTRICULAR PACEMAKER INSERTION (CRT-P)     a. 09/2013 s/p SJM 3242 CRT-P.  . Bilateral cateract surgery    . BMD  2006  . ORIF TIBIA PLATEAU  Right 12/04/2013   Procedure: OPEN REDUCTION INTERNAL FIXATION (ORIF) TIBIAL PLATEAU;  Surgeon: Verlee Rossetti, MD;  Location: [facility];  Service: Orthopedics;  Laterality: Right;  . RIGHT HEART CATHETERIZATION N/A 09/08/2012   Procedure: RIGHT HEART CATH;  Surgeon: Dolores Patty, MD;  Location: [facility] CATH LAB;  Service: Cardiovascular;  Laterality: N/A;    There were no vitals filed for this visit.      Subjective Assessment - 05/08/16 1532    Subjective  I get so lazy at this time of day.   Patient is accompained by: Family member   Pertinent History see epic snapshot, CVA in 12/2015, diabetic neuropathy   Patient Stated Goals I want better energy   Currently in Pain? No/denies   Multiple Pain Sites No                      OT Treatments/Exercises (OP) - 05/08/16 0001      ADLs   LB Dressing Son indicates that patient will mostly dress herself once he brings her clothing, although not always in the correct order, e.g at times she puts shoes on before pants.  Attnetion to task still seems to be significant area of impairment     Visual/Perceptual Exercises   Scanning Tabletop   Scanning - Tabletop Engaged patient in game of perfection -  not timed and with moderate and frequent cueing.  Patient scanning left spontaneously, although scans to left last.  Patient did better with left arm as right hand with intention tremor.  Completed simple 24 piece jigsaw puzzle.  Initially patient minimally engages, apparently overwhelmed.  With encouragement, and increased time, and with reduced field of choices, patient able to effectively scan an dlocate, and orient puzzle pieces.                  OT Education - 05/08/16 1604    Education provided Yes   Education Details discussed with son - progress regarding visual scanning at tabletop level, discussed with son the importance of improving active attention to task   Person(s) Educated Child(ren)   Methods Explanation    Comprehension Verbalized understanding          OT Short Term Goals - 05/08/16 1606      OT SHORT TERM GOAL #1   Title Pt and family will be mod I with home activities program - 05/06/2016   Status On-going     OT SHORT TERM GOAL #2   Title Pt will need  min vc's for table top scanning activities with functional task    Status Achieved     OT SHORT TERM GOAL #3   Title Pt will be supervision for simple hot meal prep making familiar hot meal   Status On-going     OT SHORT TERM GOAL #4   Title Pt will increase grip strength by at least 3 pounds to assist in functional tasks (baseline = 30 pounds)   Status Achieved     OT SHORT TERM GOAL #5   Title Pt will be mod I with bathing at shower level   Status On-going           OT Long Term Goals - 04/24/16 1635      OT LONG TERM GOAL #1   Title Pt and family will be mod I with home activities program - 06/03/2016   Status On-going     OT LONG TERM GOAL #2   Title Pt will be min vc's for environmental scanning with familiar functional tasks   Status On-going     OT LONG TERM GOAL #3   Title Pt will increase grip strength in LUE by 5 pounds to assist with functioal activities (baseline= 30)   Status On-going     OT LONG TERM GOAL #4   Title Pt will tolerate 25 minutes of ambulatory activity with no more than 1 rest break   Status On-going     OT LONG TERM GOAL #5   Title Pt will be mod I with shower transfers   Status On-going               Plan - 05/08/16 1605    Clinical Impression Statement Patient with steady improvement in visual scanning to left, and improved susatined attention when engaged in familiar functional task   Rehab Potential Good   OT Frequency 2x / week   OT Duration 8 weeks   OT Treatment/Interventions Self-care/ADL training;Moist Heat;DME and/or AE instruction;Energy conservation;Neuromuscular education;Therapeutic exercise;Building services engineerunctional Mobility Training;Therapeutic activities;Balance  training;Patient/family education;Visual/perceptual remediation/compensation;Cognitive remediation/compensation   Plan HEP for grip strength, balance, cooking, laundry   Consulted and Agree with Plan of Care Patient;Family member/caregiver   Family Member Consulted son Selena BattenKim      Patient will benefit from skilled therapeutic intervention in order to improve the following deficits and impairments:  Cardiopulmonary  status limiting activity, Decreased activity tolerance, Decreased balance, Decreased cognition, Decreased knowledge of use of DME, Decreased mobility, Decreased safety awareness, Difficulty walking, Decreased strength, Impaired UE functional use, Impaired sensation, Impaired vision/preception  Visit Diagnosis: Unsteadiness on feet  Visuospatial deficit  Hemiplegia and hemiparesis following unspecified cerebrovascular disease affecting left non-dominant side (HCC)  Muscle weakness (generalized)  Frontal lobe and executive function deficit  Other disturbances of skin sensation  Other symptoms and signs involving the nervous system    Problem List Patient Active Problem List   Diagnosis Date Noted  . Diabetic neuropathy, painful (HCC) 04/24/2016  . Left carotid artery stenosis   . Hypertriglyceridemia, essential 01/10/2016  . 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 Type 2 diabetes mellitus with diabetic neuropathy, with long-term current use of insulin (HCC) 10/06/2015  . Unspecified deficiency anemia 03/10/2014  . Osteoporosis, unspecified 03/10/2014  . Left bundle branch block 07/01/2013  . Coronary atherosclerosis of native coronary artery 02/16/2013  . Chronic systolic heart failure (HCC) 08/16/2012  . Congestive dilated cardiomyopathy (HCC) 08/02/2012  . HTN (hypertension) 08/02/2012  . Familial tremor 11/29/2010  . VITAMIN D DEFICIENCY 04/04/2010  . Osteoarthrosis, unspecified whether generalized or localized,  unspecified site 04/04/2010  . Hyperlipidemia with target LDL less than 70 01/19/2007  . Hypothyroidism 01/14/2007    Collier Salina, OTR/L 05/08/2016, 4:08 PM  [facility] [address] [place], [place], [address] Phone: [phone]   Fax:  [phone]  Name: Julliette Hageman MRN: [id number] Date of Birth: 06-13-1934

## 2016-05-10 ENCOUNTER — Ambulatory Visit: Payer: Medicare Other | Admitting: Physical Therapy

## 2016-05-10 ENCOUNTER — Encounter: Payer: Self-pay | Admitting: Physical Therapy

## 2016-05-10 ENCOUNTER — Ambulatory Visit: Payer: Medicare Other

## 2016-05-10 DIAGNOSIS — R29818 Other symptoms and signs involving the nervous system: Secondary | ICD-10-CM

## 2016-05-10 DIAGNOSIS — R2681 Unsteadiness on feet: Secondary | ICD-10-CM | POA: Diagnosis not present

## 2016-05-10 DIAGNOSIS — M6281 Muscle weakness (generalized): Secondary | ICD-10-CM | POA: Diagnosis not present

## 2016-05-10 DIAGNOSIS — R2689 Other abnormalities of gait and mobility: Secondary | ICD-10-CM

## 2016-05-10 DIAGNOSIS — R41842 Visuospatial deficit: Secondary | ICD-10-CM | POA: Diagnosis not present

## 2016-05-10 DIAGNOSIS — I69954 Hemiplegia and hemiparesis following unspecified cerebrovascular disease affecting left non-dominant side: Secondary | ICD-10-CM | POA: Diagnosis not present

## 2016-05-10 NOTE — Therapy (Signed)
[facility] [address] [place], [place], [address] Phone: [phone]   Fax:  [phone]  Physical Therapy Treatment  Patient Details  Name: [name] [name] MRN: [id number] Date of Birth: 15-Apr-1934 Referring Provider: Scarlette Calico, MD PCP (Dr. Leonie Man, neurologist)  Encounter Date: 05/10/2016      PT End of Session - 05/10/16 1701    Visit Number 9   Number of Visits 18   Date for PT Re-Evaluation 05/31/16   Authorization Type Medicare G-code, TRICARE   PT Start Time 1402   PT Stop Time 1445   PT Time Calculation (min) 43 min   Equipment Utilized During Treatment Gait belt   Activity Tolerance Patient tolerated treatment well;Patient limited by fatigue  standing/walking tolerance limited by fatigue   Behavior During Therapy [facility] for tasks assessed/performed      Past Medical History:  Diagnosis Date  . Chronic combined systolic and diastolic CHF, NYHA class 3 ([place])    a. 03/2013 Echo: EF 25-30%, diff HK, sev antsept HK, mild MR.  . Congestive dilated cardiomyopathy ([place])    a. 03/2013 Echo: EF 25-30%;  b. 09/2013 s/p SJM 3242 CRT-P.  Marland Kitchen Coronary artery disease    a. 08/2012 Cath: LM nl, LAD 66m LCX nondom, mild-mod nonobs dzs mid, OM1 ok, OM2 90/90p, RCA 40, EF 25-30%-->Med Rx.  . GERD (gastroesophageal reflux disease)   . High cholesterol   . Hypertension   . Hypothyroidism   . Left bundle branch block   . Osteoarthritis   . Stroke ([place]   . [place] II diabetes mellitus ([place]     Past Surgical History:  Procedure Laterality Date  . ABDOMINAL HYSTERECTOMY  1980  . BI-VENTRICULAR PACEMAKER INSERTION N/A 10/01/2013   Procedure: BI-VENTRICULAR PACEMAKER INSERTION (CRT-P);  Surgeon: SDeboraha Sprang MD;  [facility];  Service: Cardiovascular;  Laterality: N/A;  . BI-VENTRICULAR PACEMAKER INSERTION (CRT-P)     a. 09/2013 s/p SJM 3242 CRT-P.  . Bilateral cateract surgery    . BMD  2006  . ORIF TIBIA PLATEAU  Right 12/04/2013   Procedure: OPEN REDUCTION INTERNAL FIXATION (ORIF) TIBIAL PLATEAU;  Surgeon: SAugustin Schooling MD;  Location: [facility];  Service: Orthopedics;  Laterality: Right;  . RIGHT HEART CATHETERIZATION N/A 09/08/2012   Procedure: RIGHT HEART CATH;  Surgeon: DJolaine Artist MD;  [facility];  Service: Cardiovascular;  Laterality: N/A;    There were no vitals filed for this visit.      Subjective Assessment - 05/10/16 1406    Subjective Pt reports no issue or falls since last visit and has ben able to perform OTAGO exercises at home.  She states the exercises "make me tired.  I stay tired all the time."   Patient is accompained by: Family member   Limitations Standing;Walking;Lifting   Patient Stated Goals To walk and balance better. Build stamina.    Currently in Pain? No/denies   Multiple Pain Sites No                         [facility] Adult PT Treatment/Exercise - 05/10/16 0001      Ambulation/Gait   Ambulation/Gait Yes   Ambulation/Gait Assistance 4: Min guard;4: Min assist   Ambulation/Gait Assistance Details Cueing for effective obstacle negotiation, for attention to L visual field, increased stride length. See below for details on obstacle negotiation.   Ambulation Distance (Feet) 445 Feet  230' + 115' + 520  x2   Assistive device Straight cane   Gait Pattern Step-through pattern;Decreased arm swing - left;Decreased stride length;Decreased hip/knee flexion - left;Decreased hip/knee flexion - right;Shuffle;Trunk flexed;Narrow base of support;Poor foot clearance - left   Ambulation Surface Level;Indoor   Gait Comments With SPC and min guard-min A, pt negotiated obstacle course 2 trials x50' each with cueing for sequencing/technique to step around cones, over foam beam, and to traverse compliant surface (red mat).            [facility]) - 05/10/16 1654    PWR! exercises Moves in standing   PWR! Up x20 reps with cueing for thoracic spine  extension.   PWR! Rock x20 reps with cueing for technique, full lateral weight shift, and trunk elongation   PWR! Twist x20 reps; cueing emphasized consistent hand contact for full trunk rotation.   PWR Step x20 reps; cues to facilitate increased step length, full lateral weight shift, and cervical spine rotation to respective side.   Comments all PWR! moves performed in front of countertop with intermittent UE support at chair placed in front of pt.; SPT (facing pt) provided demo cueing and verbal cues as described above.               PT Short Term Goals - 05/08/16 1413      PT SHORT TERM GOAL #1   Title Patient with family cues demonstrates understanding of [place] HEP. (Target Date: 05/08/2016)   Baseline GOAL MET: 05/08/16   Time 1   Period Months   Status Achieved     PT SHORT TERM GOAL #2   Title Timed Up-Go <15sec with supervision. (Target Date: 05/08/2016)   Baseline TUG: 18.87sec; 05/08/16   Time 1   Period Months   Status On-going     PT SHORT TERM GOAL #3   Title Standing balance turning head to look to side and reaches 5" & to floor with supervision. (Target Date: 05/08/2016)   Baseline GOAL MET: 05/08/16   Time 1   Period Months   Status Achieved     PT SHORT TERM GOAL #4   Title Patient ambulates 250' around obstacles with LRAD with supervision. (Target Date: 05/08/2016)   Baseline GOAL MET: 05/08/16   Time 1   Period Months   Status Achieved     PT SHORT TERM GOAL #5   Title Cognitive TUG <25sec with simple naming task.  (Target Date: 05/08/2016)   Baseline Cog TUG: 45.34sec; 05/08/16   Time 1   Period Months   Status On-going           PT Long Term Goals - 04/11/16 1557      PT LONG TERM GOAL #1   Title Patient and family demonstrate & verbalize understanding of ongoing fitness plan & HEP. (Target Date: 05/31/2016)   Time 2   Period Months   Status On-going     PT LONG TERM GOAL #2   Title Timed Up-Go standard <13.5sec safely without device.  (Target Date: 05/31/2016)   Time 2   Period Months   Status On-going     PT LONG TERM GOAL #3   Title Berg Balance >36/56 to indicate lower fall risk. (Target Date: 05/31/2016)   Time 2   Period Months   Status On-going     PT LONG TERM GOAL #4   Title Dynamic Gait Index >/= 10/24 to indicate lower fall risk. (Target Date: 05/31/2016)   Time 2   Period Months  Status On-going     PT LONG TERM GOAL #5   Title Patient ambulates 500' with LRAD with supervision for cognitive deficits no balance losses safely. (Target Date: 05/31/2016)   Time 2   Period Months   Status On-going     PT LONG TERM GOAL #6   Title Patient negotiates ramps, curbs & stairs (1 rail) with LRAD with supervision from family safely. (Target Date: 05/31/2016)   Time 2   Period Months   Status On-going     PT LONG TERM GOAL #7   Title Patient ambulates around furniture with LRAD modified independent for household mobility. (Target Date: 05/31/2016)   Time 2   Period Months   Status On-going               Plan - 05/10/16 1702    Clinical Impression Statement Skilled session focused on use of PWR! moves to facilitate larger amplitude movement; and gait training with emphasis on obstacle negotiation and attention to L visual field. Pt exhibited improved arousal during session and required less cueing for technique with PWR! exercises.    Rehab Potential Good   PT Frequency 2x / week   PT Duration Other (comment)   PT Treatment/Interventions ADLs/Self Care Home Management;DME Instruction;Gait training;Stair training;Functional mobility training;Therapeutic activities;Therapeutic exercise;Balance training;Neuromuscular re-education;Patient/family education;Orthotic Fit/Training;Vestibular   PT Next Visit Plan GCODES and PN. Continue gait training with cognitive tasks, large amplitude movements   Consulted and Agree with Plan of Care Patient      Patient will benefit from skilled therapeutic  intervention in order to improve the following deficits and impairments:  Abnormal gait, Decreased activity tolerance, Decreased balance, Decreased coordination, Decreased endurance, Decreased mobility, Decreased safety awareness, Decreased strength, Impaired vision/preception, Postural dysfunction  Visit Diagnosis: Unsteadiness on feet  Other abnormalities of gait and mobility  Muscle weakness (generalized)  Other symptoms and signs involving the nervous system     Problem List Patient Active Problem List   Diagnosis Date Noted  . Diabetic neuropathy, painful ([place]) 04/24/2016  . Left carotid artery stenosis   . Hypertriglyceridemia, essential 01/10/2016  . Cerebral infarction due to thrombosis of right posterior cerebral artery ([place]) 01/10/2016  . Type 2 diabetes mellitus with diabetic neuropathy, with long-term current use of insulin ([place]) 10/06/2015  . Unspecified deficiency anemia 03/10/2014  . Osteoporosis, unspecified 03/10/2014  . Left bundle branch block 07/01/2013  . Coronary atherosclerosis of native coronary artery 02/16/2013  . Chronic systolic heart failure ([place]) 08/16/2012  . Congestive dilated cardiomyopathy ([place]) 08/02/2012  . HTN (hypertension) 08/02/2012  . Familial tremor 11/29/2010  . VITAMIN D DEFICIENCY 04/04/2010  . Osteoarthrosis, unspecified whether generalized or localized, unspecified site 04/04/2010  . Hyperlipidemia with target LDL less than 70 01/19/2007  . Hypothyroidism 01/14/2007    Billie Ruddy, PT, DPT [facility] [address] [place], [place], [address] Phone: [phone]   Fax:  [phone] 05/10/16, 5:06 PM  Name: [name] [name] MRN: [id number] Date of Birth: May 07, 1934

## 2016-05-13 ENCOUNTER — Ambulatory Visit: Payer: Medicare Other | Admitting: Rehabilitation

## 2016-05-13 ENCOUNTER — Ambulatory Visit: Payer: Medicare Other | Attending: Internal Medicine | Admitting: Occupational Therapy

## 2016-05-13 ENCOUNTER — Encounter: Payer: Self-pay | Admitting: Rehabilitation

## 2016-05-13 ENCOUNTER — Encounter: Payer: Self-pay | Admitting: Occupational Therapy

## 2016-05-13 DIAGNOSIS — R41844 Frontal lobe and executive function deficit: Secondary | ICD-10-CM | POA: Diagnosis not present

## 2016-05-13 DIAGNOSIS — R41842 Visuospatial deficit: Secondary | ICD-10-CM | POA: Diagnosis not present

## 2016-05-13 DIAGNOSIS — R29818 Other symptoms and signs involving the nervous system: Secondary | ICD-10-CM | POA: Diagnosis not present

## 2016-05-13 DIAGNOSIS — M6281 Muscle weakness (generalized): Secondary | ICD-10-CM | POA: Diagnosis not present

## 2016-05-13 DIAGNOSIS — I69954 Hemiplegia and hemiparesis following unspecified cerebrovascular disease affecting left non-dominant side: Secondary | ICD-10-CM | POA: Diagnosis not present

## 2016-05-13 DIAGNOSIS — R2689 Other abnormalities of gait and mobility: Secondary | ICD-10-CM | POA: Insufficient documentation

## 2016-05-13 DIAGNOSIS — R2681 Unsteadiness on feet: Secondary | ICD-10-CM

## 2016-05-13 DIAGNOSIS — R41841 Cognitive communication deficit: Secondary | ICD-10-CM | POA: Diagnosis not present

## 2016-05-13 NOTE — Therapy (Signed)
[facility] [address] [place], [place], [address] Phone: [phone]   Fax:  [phone]  Occupational Therapy Treatment  Patient Details  Name: [name] [name] MRN: [id number] Date of Birth: 16-Feb-1934 Referring Provider: Dr. Sanda Linger  Encounter Date: 05/13/2016      OT End of Session - 05/13/16 1558    Visit Number 7   Number of Visits 16   Date for OT Re-Evaluation 06/03/16   Authorization Type medicare- will need g code and progress note every 10th visit   Authorization Time Period 60 days   Authorization - Visit Number 7   Authorization - Number of Visits 10   OT Start Time 1315   OT Stop Time 1359   OT Time Calculation (min) 44 min   Activity Tolerance Patient tolerated treatment well      Past Medical History:  Diagnosis Date  . Chronic combined systolic and diastolic CHF, NYHA class 3 (HCC)    a. 03/2013 Echo: EF 25-30%, diff HK, sev antsept HK, mild MR.  . Congestive dilated cardiomyopathy (HCC)    a. 03/2013 Echo: EF 25-30%;  b. 09/2013 s/p SJM 3242 CRT-P.  Marland Kitchen Coronary artery disease    a. 08/2012 Cath: LM nl, LAD 34m, LCX nondom, mild-mod nonobs dzs mid, OM1 ok, OM2 90/90p, RCA 40, EF 25-30%-->Med Rx.  . GERD (gastroesophageal reflux disease)   . High cholesterol   . Hypertension   . Hypothyroidism   . Left bundle branch block   . Osteoarthritis   . Stroke (HCC)   . Type II diabetes mellitus (HCC)     Past Surgical History:  Procedure Laterality Date  . ABDOMINAL HYSTERECTOMY  1980  . BI-VENTRICULAR PACEMAKER INSERTION N/A 10/01/2013   Procedure: BI-VENTRICULAR PACEMAKER INSERTION (CRT-P);  Surgeon: Duke Salvia, MD;  [facility];  Service: Cardiovascular;  Laterality: N/A;  . BI-VENTRICULAR PACEMAKER INSERTION (CRT-P)     a. 09/2013 s/p SJM 3242 CRT-P.  . Bilateral cateract surgery    . BMD  2006  . ORIF TIBIA PLATEAU Right 12/04/2013   Procedure: OPEN REDUCTION INTERNAL  FIXATION (ORIF) TIBIAL PLATEAU;  Surgeon: Verlee Rossetti, MD;  [facility];  Service: Orthopedics;  Laterality: Right;  . RIGHT HEART CATHETERIZATION N/A 09/08/2012   Procedure: RIGHT HEART CATH;  Surgeon: Dolores Patty, MD;  Location: [facility] CATH LAB;  Service: Cardiovascular;  Laterality: N/A;    There were no vitals filed for this visit.      Subjective Assessment - 05/13/16 1322    Subjective  I get off balance sometimes.    Pertinent History see epic snapshot, CVA in 12/2015, diabetic neuropathy   Patient Stated Goals I want better energy   Currently in Pain? No/denies                      OT Treatments/Exercises (OP) - 05/13/16 0001      Neurological Re-education Exercises   Other Exercises 1 Therapeutic activities to address attention to task, functional use of L hand, increasing strength of L hand, visual scanning.  Pt requires encouragement and occassional redirection to task.  Also addressed dynamic standing balance - pt using single point cane and frequently loses her balance especially with head turns, stops, stops and body turns. Pt reports that she has not actually fallen at home but in the clinic is requiring min guard to min a and often appears as if she is unaware that she is  losing or has lost her balance.  Pt reports she has a walker at home. Pt with no LOB with walker even with head turns, stops, starts and turns.  Will discuss with PT using walker at home for functional ambulation to reduce fall risk.  Pt is very agreeable to using walker at home.                  OT Short Term Goals - 05/13/16 1340      OT SHORT TERM GOAL #1   Title Pt and family will be mod I with home activities program - 05/06/2016   Status On-going     OT SHORT TERM GOAL #2   Title Pt will need  min vc's for table top scanning activities with functional task    Status Achieved     OT SHORT TERM GOAL #3   Title Pt will be supervision for simple hot meal prep  making familiar hot meal   Status Not Met     OT SHORT TERM GOAL #4   Title Pt will increase grip strength by at least 3 pounds to assist in functional tasks (baseline = 30 pounds)   Status Achieved     OT SHORT TERM GOAL #5   Title Pt will be mod I with bathing at shower level   Status Not Met           OT Long Term Goals - 05/13/16 1556      OT LONG TERM GOAL #1   Title Pt and family will be mod I with home activities program - 06/03/2016   Status On-going     OT LONG TERM GOAL #2   Title Pt will be min vc's for environmental scanning with familiar functional tasks   Status On-going     OT LONG TERM GOAL #3   Title Pt will increase grip strength in LUE by 5 pounds to assist with functioal activities (baseline= 30)   Status On-going     OT LONG TERM GOAL #4   Title Pt will tolerate 25 minutes of ambulatory activity with no more than 1 rest break   Status On-going     OT LONG TERM GOAL #5   Title Pt will be mod I with shower transfers   Status On-going               Plan - 05/13/16 1556    Clinical Impression Statement Pt with slow progress toward goals. Functionally pt is much safer and demonstrates improved balance control when using walker.    Rehab Potential Good   OT Frequency 2x / week   OT Duration 8 weeks   OT Treatment/Interventions Self-care/ADL training;Moist Heat;DME and/or AE instruction;Energy conservation;Neuromuscular education;Therapeutic exercise;Therapist, nutritional;Therapeutic activities;Balance training;Patient/family education;Visual/perceptual remediation/compensation;Cognitive remediation/compensation   Plan balance, functional ambulation with walker, safety, cooking, laundry   Consulted and Agree with Plan of Care Patient;Family member/caregiver   Family Member Consulted son Maudie Mercury      Patient will benefit from skilled therapeutic intervention in order to improve the following deficits and impairments:  Cardiopulmonary status  limiting activity, Decreased activity tolerance, Decreased balance, Decreased cognition, Decreased knowledge of use of DME, Decreased mobility, Decreased safety awareness, Difficulty walking, Decreased strength, Impaired UE functional use, Impaired sensation, Impaired vision/preception  Visit Diagnosis: Unsteadiness on feet  Other symptoms and signs involving the nervous system  Visuospatial deficit  Hemiplegia and hemiparesis following unspecified cerebrovascular disease affecting left non-dominant side (HCC)  Frontal lobe and executive  function deficit    Problem List Patient Active Problem List   Diagnosis Date Noted  . Diabetic neuropathy, painful ([place]) 04/24/2016  . Left carotid artery stenosis   . Hypertriglyceridemia, essential 01/10/2016  . Cerebral infarction due to thrombosis of right posterior cerebral artery ([place]) 01/10/2016  . Type 2 diabetes mellitus with diabetic neuropathy, with long-term current use of insulin ([place]) 10/06/2015  . Unspecified deficiency anemia 03/10/2014  . Osteoporosis, unspecified 03/10/2014  . Left bundle branch block 07/01/2013  . Coronary atherosclerosis of native coronary artery 02/16/2013  . Chronic systolic heart failure ([place]) 08/16/2012  . Congestive dilated cardiomyopathy ([place]) 08/02/2012  . HTN (hypertension) 08/02/2012  . Familial tremor 11/29/2010  . VITAMIN D DEFICIENCY 04/04/2010  . Osteoarthrosis, unspecified whether generalized or localized, unspecified site 04/04/2010  . Hyperlipidemia with target LDL less than 70 01/19/2007  . Hypothyroidism 01/14/2007    Quay Burow, OTR/L 05/13/2016, 4:00 PM  [facility] [address] [place], [place], [address] Phone: [phone]   Fax:  [phone]  Name: [name] [name] MRN: [id number] Date of Birth: [birth year]

## 2016-05-13 NOTE — Patient Instructions (Signed)
1. Grip Strengthening (Resistive Putty)- RED PUTTY    Squeeze putty using thumb and all fingers. Repeat _15___ times. Do __2__ sessions per day.     Copyright  VHI. All rights reserved.

## 2016-05-13 NOTE — Therapy (Signed)
[facility] [address] [place], [place], [address] Phone: [phone]   Fax:  [phone]  Physical Therapy Treatment  Patient Details  Name: [name] [name] MRN: [id number] Date of Birth: April 08, 1934 Referring Provider: Scarlette Calico, MD PCP (Dr. Leonie Man, neurologist)  Encounter Date: 05/13/2016      PT End of Session - 05/13/16 1442    Visit Number 10   Number of Visits 18   Date for PT Re-Evaluation 05/31/16   Authorization Type Medicare G-code, TRICARE   PT Start Time 1402   PT Stop Time 1445   PT Time Calculation (min) 43 min   Equipment Utilized During Treatment Gait belt   Activity Tolerance Patient tolerated treatment well;Patient limited by fatigue  standing/walking tolerance limited by fatigue   Behavior During Therapy [facility] for tasks assessed/performed      Past Medical History:  Diagnosis Date  . Chronic combined systolic and diastolic CHF, NYHA class 3 ([place])    a. 03/2013 Echo: EF 25-30%, diff HK, sev antsept HK, mild MR.  . Congestive dilated cardiomyopathy ([place])    a. 03/2013 Echo: EF 25-30%;  b. 09/2013 s/p SJM 3242 CRT-P.  Marland Kitchen Coronary artery disease    a. 08/2012 Cath: LM nl, LAD 3m LCX nondom, mild-mod nonobs dzs mid, OM1 ok, OM2 90/90p, RCA 40, EF 25-30%-->Med Rx.  . GERD (gastroesophageal reflux disease)   . High cholesterol   . Hypertension   . Hypothyroidism   . Left bundle branch block   . Osteoarthritis   . Stroke ([place]   . Type II diabetes mellitus ([place]     Past Surgical History:  Procedure Laterality Date  . ABDOMINAL HYSTERECTOMY  1980  . BI-VENTRICULAR PACEMAKER INSERTION N/A 10/01/2013   Procedure: BI-VENTRICULAR PACEMAKER INSERTION (CRT-P);  Surgeon: SDeboraha Sprang MD;  [facility];  Service: Cardiovascular;  Laterality: N/A;  . BI-VENTRICULAR PACEMAKER INSERTION (CRT-P)     a. 09/2013 s/p SJM 3242 CRT-P.  . Bilateral cateract surgery    . BMD  2006  . ORIF TIBIA  PLATEAU Right 12/04/2013   Procedure: OPEN REDUCTION INTERNAL FIXATION (ORIF) TIBIAL PLATEAU;  Surgeon: SAugustin Schooling MD;  [facility];  Service: Orthopedics;  Laterality: Right;  . RIGHT HEART CATHETERIZATION N/A 09/08/2012   Procedure: RIGHT HEART CATH;  Surgeon: DJolaine Artist MD;  [facility];  Service: Cardiovascular;  Laterality: N/A;    There were no vitals filed for this visit.      Subjective Assessment - 05/13/16 1409    Subjective Reports being tired today.    Patient is accompained by: Family member  Son, KYvone Neu  Limitations Standing;Walking;Lifting   Patient Stated Goals To walk and balance better. Build stamina.    Currently in Pain? No/denies                         [facility] - 05/13/16 1416      Transfers   Transfers Sit to Stand;Stand to SLockheed MartinTransfers   Sit to Stand 6: Modified independent (Device/Increase time);With upper extremity assist;With armrests;From chair/3-in-1   Stand to Sit 6: Modified independent (Device/Increase time);With upper extremity assist;To chair/3-in-1     Ambulation/Gait   Ambulation/Gait Yes   Ambulation/Gait Assistance 5: Supervision;4: Min guard   Ambulation/Gait Assistance Details Initiated gait with RW during session as pt tends to be high fall risk with SPC due to balance deficits, cognitive deficits, and  L neglect.  Performed several bouts of gait during session over indoor surfaces in order to have blocked practice of maintaining position inside of RW and scanning L environment.  Also worked on gait over paved outdoor surfaces with RW.  Performed at S to min/guard level with MAX verbal cues to scan to the L and for turning L (needed some assist to make L turn).  Performed curb step both outside and inside with negotiation of ramp with max verbal cues for sequencing and technique as pt tends to be impulsive when performing curb.    Ambulation Distance (Feet) 300 Feet   230' x 1, 300' x 1, 150' x 1   Assistive device Rolling walker   Gait Pattern Step-through pattern;Decreased arm swing - left;Decreased stride length;Decreased hip/knee flexion - left;Decreased hip/knee flexion - right;Shuffle;Trunk flexed;Narrow base of support;Poor foot clearance - left   Ambulation Surface Level;Unlevel;Indoor;Outdoor;Paved   Stairs Yes   Stairs Assistance 5: Supervision   Stair Management Technique Two rails;Alternating pattern;Forwards  mod cues to utilize L rail   Number of Stairs 4   Height of Stairs 6     Neuro Re-ed    Neuro Re-ed Details  Performed gait with cognitive task during session to recall letters (placed on sticky notes on wall) to work on scanning L environment, making turns with RW and reaching for targets while using RW.                  PT Education - 05/13/16 1409    Education provided Yes   Education Details use of RW at all times and for son to cue for L neglect and position inside of RW   Person(s) Educated Patient;Child(ren)   Methods Explanation;Demonstration   Comprehension Verbalized understanding          PT Short Term Goals - 05/08/16 1413      PT SHORT TERM GOAL #1   Title Patient with family cues demonstrates understanding of [place] HEP. (Target Date: 05/08/2016)   Baseline GOAL MET: 05/08/16   Time 1   Period Months   Status Achieved     PT SHORT TERM GOAL #2   Title Timed Up-Go <15sec with supervision. (Target Date: 05/08/2016)   Baseline TUG: 18.87sec; 05/08/16   Time 1   Period Months   Status On-going     PT SHORT TERM GOAL #3   Title Standing balance turning head to look to side and reaches 5" & to floor with supervision. (Target Date: 05/08/2016)   Baseline GOAL MET: 05/08/16   Time 1   Period Months   Status Achieved     PT SHORT TERM GOAL #4   Title Patient ambulates 250' around obstacles with LRAD with supervision. (Target Date: 05/08/2016)   Baseline GOAL MET: 05/08/16   Time 1   Period Months    Status Achieved     PT SHORT TERM GOAL #5   Title Cognitive TUG <25sec with simple naming task.  (Target Date: 05/08/2016)   Baseline Cog TUG: 45.34sec; 05/08/16   Time 1   Period Months   Status On-going           PT Long Term Goals - 04/11/16 1557      PT LONG TERM GOAL #1   Title Patient and family demonstrate & verbalize understanding of ongoing fitness plan & HEP. (Target Date: 05/31/2016)   Time 2   Period Months   Status On-going     PT LONG TERM GOAL #2  Title Timed Up-Go standard <13.5sec safely without device. (Target Date: 05/31/2016)   Time 2   Period Months   Status On-going     PT LONG TERM GOAL #3   Title Berg Balance >36/56 to indicate lower fall risk. (Target Date: 05/31/2016)   Time 2   Period Months   Status On-going     PT LONG TERM GOAL #4   Title Dynamic Gait Index >/= 10/24 to indicate lower fall risk. (Target Date: 05/31/2016)   Time 2   Period Months   Status On-going     PT LONG TERM GOAL #5   Title Patient ambulates 500' with LRAD with supervision for cognitive deficits no balance losses safely. (Target Date: 05/31/2016)   Time 2   Period Months   Status On-going     PT LONG TERM GOAL #6   Title Patient negotiates ramps, curbs & stairs (1 rail) with LRAD with supervision from family safely. (Target Date: 05/31/2016)   Time 2   Period Months   Status On-going     PT LONG TERM GOAL #7   Title Patient ambulates around furniture with LRAD modified independent for household mobility. (Target Date: 05/31/2016)   Time 2   Period Months   Status On-going               Plan - 05-18-16 1953    Clinical Impression Statement Skilled session focused on use of RW over varying indoor surfaces as well as paved outdoor surfaces, safety negotiating around obstacles and up/down curb/ramp.  Discussed this with son and recommend that pt use RW at all times due to high fall risk and L neglect.     Rehab Potential Good   PT Frequency 2x / week    PT Duration Other (comment)   PT Treatment/Interventions ADLs/Self Care Home Management;DME Instruction;Gait training;Stair training;Functional mobility training;Therapeutic activities;Therapeutic exercise;Balance training;Neuromuscular re-education;Patient/family education;Orthotic Fit/Training;Vestibular   PT Next Visit Plan Continue gait training (with RW) with cognitive tasks, large amplitude movements   Consulted and Agree with Plan of Care Patient;Family member/caregiver   Family Member Consulted son      Patient will benefit from skilled therapeutic intervention in order to improve the following deficits and impairments:  Abnormal gait, Decreased activity tolerance, Decreased balance, Decreased coordination, Decreased endurance, Decreased mobility, Decreased safety awareness, Decreased strength, Impaired vision/preception, Postural dysfunction  Visit Diagnosis: Unsteadiness on feet  Hemiplegia and hemiparesis following unspecified cerebrovascular disease affecting left non-dominant side (HCC)  Other abnormalities of gait and mobility  Muscle weakness (generalized)       G-Codes - 05-18-2016 1957    Functional Assessment Tool Used TUG: 18.87 sec w/ cane, Cognitive TUG: 45.34   Functional Limitation Mobility: Walking and moving around   Mobility: Walking and Moving Around Current Status [phone]) At least 60 percent but less than 80 percent impaired, limited or restricted   Mobility: Walking and Moving Around Goal Status [phone]) At least 40 percent but less than 60 percent impaired, limited or restricted       Physical Therapy Progress Note  Dates of Reporting Period: 04/08/16 to 18-May-2016  Objective Reports of Subjective Statement: See above  Objective Measurements: TUG 18.87 w/ cane  Goal Update: See LTG's above  Plan: Continue POC  Reason Skilled Services are Required: Continues to require skilled PT to address balance deficits, cognitive deficits as related to  functional mobility and overall strength.       Problem List Patient Active Problem List   Diagnosis Date Noted  .  Diabetic neuropathy, painful ([place]) 04/24/2016  . Left carotid artery stenosis   . Hypertriglyceridemia, essential 01/10/2016  . Cerebral infarction due to thrombosis of right posterior cerebral artery ([place]) 01/10/2016  . Type 2 diabetes mellitus with diabetic neuropathy, with long-term current use of insulin ([place]) 10/06/2015  . Unspecified deficiency anemia 03/10/2014  . Osteoporosis, unspecified 03/10/2014  . Left bundle branch block 07/01/2013  . Coronary atherosclerosis of native coronary artery 02/16/2013  . Chronic systolic heart failure ([place]) 08/16/2012  . Congestive dilated cardiomyopathy ([place]) 08/02/2012  . HTN (hypertension) 08/02/2012  . Familial tremor 11/29/2010  . VITAMIN D DEFICIENCY 04/04/2010  . Osteoarthrosis, unspecified whether generalized or localized, unspecified site 04/04/2010  . Hyperlipidemia with target LDL less than 70 01/19/2007  . Hypothyroidism 01/14/2007    Denice Bors 05/13/2016, 8:07 PM  [facility] [address] [place], [place], [address] Phone: [phone]   Fax:  [phone]  Name: Kima Malenfant MRN: [id number] Date of Birth: 02-02-1934

## 2016-05-15 ENCOUNTER — Other Ambulatory Visit: Payer: Self-pay | Admitting: Internal Medicine

## 2016-05-16 ENCOUNTER — Encounter: Payer: Self-pay | Admitting: Occupational Therapy

## 2016-05-16 ENCOUNTER — Ambulatory Visit: Payer: Medicare Other

## 2016-05-16 ENCOUNTER — Ambulatory Visit: Payer: Medicare Other | Admitting: Occupational Therapy

## 2016-05-16 ENCOUNTER — Other Ambulatory Visit: Payer: Self-pay | Admitting: Neurology

## 2016-05-16 VITALS — BP 105/61 | HR 66

## 2016-05-16 DIAGNOSIS — R2681 Unsteadiness on feet: Secondary | ICD-10-CM

## 2016-05-16 DIAGNOSIS — I69954 Hemiplegia and hemiparesis following unspecified cerebrovascular disease affecting left non-dominant side: Secondary | ICD-10-CM

## 2016-05-16 DIAGNOSIS — R41844 Frontal lobe and executive function deficit: Secondary | ICD-10-CM | POA: Diagnosis not present

## 2016-05-16 DIAGNOSIS — R41841 Cognitive communication deficit: Secondary | ICD-10-CM | POA: Diagnosis not present

## 2016-05-16 DIAGNOSIS — R41842 Visuospatial deficit: Secondary | ICD-10-CM

## 2016-05-16 DIAGNOSIS — R29818 Other symptoms and signs involving the nervous system: Secondary | ICD-10-CM | POA: Diagnosis not present

## 2016-05-16 DIAGNOSIS — R2689 Other abnormalities of gait and mobility: Secondary | ICD-10-CM

## 2016-05-16 DIAGNOSIS — I6300 Cerebral infarction due to thrombosis of unspecified precerebral artery: Secondary | ICD-10-CM

## 2016-05-16 NOTE — Therapy (Signed)
[facility] [address] [place], [place], [address] Phone: [phone]   Fax:  [phone]  Physical Therapy Treatment  Patient Details  Name: [name] [name] MRN: [id number] Date of Birth: 01-21-1934 Referring Provider: Scarlette Calico, MD PCP (Dr. Leonie Man, neurologist)  Encounter Date: 05/16/2016      PT End of Session - 05/16/16 1445    Visit Number 11   Number of Visits 18   Date for PT Re-Evaluation 05/31/16   Authorization Type Medicare G-code, TRICARE   PT Start Time 1408  pt finishing OT and very fatigued   PT Stop Time 1446   PT Time Calculation (min) 38 min   Equipment Utilized During Treatment Gait belt   Activity Tolerance Patient limited by fatigue   Behavior During Therapy [facility] for tasks assessed/performed      Past Medical History:  Diagnosis Date  . Chronic combined systolic and diastolic CHF, NYHA class 3 ([place])    a. 03/2013 Echo: EF 25-30%, diff HK, sev antsept HK, mild MR.  . Congestive dilated cardiomyopathy ([place])    a. 03/2013 Echo: EF 25-30%;  b. 09/2013 s/p SJM 3242 CRT-P.  Marland Kitchen Coronary artery disease    a. 08/2012 Cath: LM nl, LAD 21m LCX nondom, mild-mod nonobs dzs mid, OM1 ok, OM2 90/90p, RCA 40, EF 25-30%-->Med Rx.  . GERD (gastroesophageal reflux disease)   . High cholesterol   . Hypertension   . Hypothyroidism   . Left bundle branch block   . Osteoarthritis   . [place]roke ([place]   . Type II diabetes mellitus ([place]     Past Surgical History:  Procedure Laterality Date  . ABDOMINAL HYSTERECTOMY  1980  . BI-VENTRICULAR PACEMAKER INSERTION N/A 10/01/2013   Procedure: BI-VENTRICULAR PACEMAKER INSERTION (CRT-P);  Surgeon: SDeboraha Sprang MD;  [facility];  Service: Cardiovascular;  Laterality: N/A;  . BI-VENTRICULAR PACEMAKER INSERTION (CRT-P)     a. 09/2013 s/p SJM 3242 CRT-P.  . Bilateral cateract surgery    . BMD  2006  . ORIF TIBIA PLATEAU Right 12/04/2013   Procedure: OPEN  REDUCTION INTERNAL FIXATION (ORIF) TIBIAL PLATEAU;  Surgeon: SAugustin Schooling MD;  [facility];  Service: Orthopedics;  Laterality: Right;  . RIGHT HEART CATHETERIZATION N/A 09/08/2012   Procedure: RIGHT HEART CATH;  Surgeon: DJolaine Artist MD;  [facility];  Service: Cardiovascular;  Laterality: N/A;    Vitals:   05/16/16 1422  BP: 105/61  Pulse: 66  SpO2: 98%        Subjective Assessment - 05/16/16 1410    Subjective Pt denied falls or changes since last visit. OT and PT spoke prior to appt. and OT stated pt's balance was better in the kitchen and during amb. with RW vs. SPC.    Limitations Standing;Walking;Lifting   Patient Stated Goals To walk and balance better. Build stamina.    Currently in Pain? No/denies                         [facility] - 05/16/16 1414      Ambulation/Gait   Ambulation/Gait Yes   Ambulation/Gait Assistance 5: Supervision;4: Min guard   Ambulation/Gait Assistance Details Cues to improve stride length, staying within RW, attending to L side and to look straight ahead.   Ambulation Distance (Feet) 230 Feet  75'x4, 50'    Assistive device Rolling walker   Gait Pattern Step-through pattern;Decreased arm swing - left;Decreased  ramps, curbs & stairs (1 rail) with LRAD with supervision from family safely. (Target Date: 05/31/2016)   Time 2   Period Months   Status On-going     PT LONG TERM GOAL #7   Title Patient ambulates around furniture with LRAD modified independent for household mobility. (Target Date: 05/31/2016)   Time 2   Period Months   Status On-going               Plan - 05/16/16 1646    Clinical Impression Statement Pt continues to experience fatigue and requires frequent rest breaks during PT session. PT provided extensive education on the importance of improving endurance by incr. ambulation time and continuing HEP. Pt would continue to beneift from skilled PT to improve safety during functional mobility.    Rehab Potential Good   PT Frequency 2x / week   PT Duration Other (comment)   PT Treatment/Interventions ADLs/Self Care Home Management;DME Instruction;Gait training;Stair training;Functional mobility training;Therapeutic activities;Therapeutic exercise;Balance training;Neuromuscular re-education;Patient/family education;Orthotic Fit/Training;Vestibular   PT Next Visit Plan Continue gait training (with RW) with cognitive tasks (and attending to L side), large amplitude movements   Consulted and Agree with Plan of Care Patient;Family member/caregiver   Family Member Consulted son      Patient will benefit from skilled therapeutic intervention in order to improve the following deficits and impairments:  Abnormal gait, Decreased activity tolerance, Decreased balance, Decreased coordination, Decreased endurance, Decreased mobility, Decreased safety awareness, Decreased strength, Impaired vision/preception, Postural  dysfunction  Visit Diagnosis: Other abnormalities of gait and mobility  Hemiplegia and hemiparesis following unspecified cerebrovascular disease affecting left non-dominant side [facility])     Problem List Patient Active Problem List   Diagnosis Date Noted  . Diabetic neuropathy, painful ([place]) 04/24/2016  . Left carotid artery stenosis   . Hypertriglyceridemia, essential 01/10/2016  . Cerebral infarction due to thrombosis of right posterior cerebral artery ([place]) 01/10/2016  . Type 2 diabetes mellitus with diabetic neuropathy, with long-term current use of insulin ([place]) 10/06/2015  . Unspecified deficiency anemia 03/10/2014  . Osteoporosis, unspecified 03/10/2014  . Left bundle branch block 07/01/2013  . Coronary atherosclerosis of native coronary artery 02/16/2013  . Chronic systolic heart failure ([place]) 08/16/2012  . Congestive dilated cardiomyopathy ([place]) 08/02/2012  . HTN (hypertension) 08/02/2012  . Familial tremor 11/29/2010  . VITAMIN D DEFICIENCY 04/04/2010  . Osteoarthrosis, unspecified whether generalized or localized, unspecified site 04/04/2010  . Hyperlipidemia with target LDL less than 70 01/19/2007  . Hypothyroidism 01/14/2007    Andrewjames Weirauch L 05/16/2016, 4:49 PM  [facility] [address] [place], [place], [address] Phone: [phone]   Fax:  [phone]  Name: [name] [name] MRN: [id number] Date of Birth: 1934/01/05  Geoffry Paradise, PT,DPT 05/16/16 4:49 PM Phone: [phone] Fax: [phone]  ramps, curbs & stairs (1 rail) with LRAD with supervision from family safely. (Target Date: 05/31/2016)   Time 2   Period Months   Status On-going     PT LONG TERM GOAL #7   Title Patient ambulates around furniture with LRAD modified independent for household mobility. (Target Date: 05/31/2016)   Time 2   Period Months   Status On-going               Plan - 05/16/16 1646    Clinical Impression Statement Pt continues to experience fatigue and requires frequent rest breaks during PT session. PT provided extensive education on the importance of improving endurance by incr. ambulation time and continuing HEP. Pt would continue to beneift from skilled PT to improve safety during functional mobility.    Rehab Potential Good   PT Frequency 2x / week   PT Duration Other (comment)   PT Treatment/Interventions ADLs/Self Care Home Management;DME Instruction;Gait training;Stair training;Functional mobility training;Therapeutic activities;Therapeutic exercise;Balance training;Neuromuscular re-education;Patient/family education;Orthotic Fit/Training;Vestibular   PT Next Visit Plan Continue gait training (with RW) with cognitive tasks (and attending to L side), large amplitude movements   Consulted and Agree with Plan of Care Patient;Family member/caregiver   Family Member Consulted son      Patient will benefit from skilled therapeutic intervention in order to improve the following deficits and impairments:  Abnormal gait, Decreased activity tolerance, Decreased balance, Decreased coordination, Decreased endurance, Decreased mobility, Decreased safety awareness, Decreased strength, Impaired vision/preception, Postural  dysfunction  Visit Diagnosis: Other abnormalities of gait and mobility  Hemiplegia and hemiparesis following unspecified cerebrovascular disease affecting left non-dominant side [facility])     Problem List Patient Active Problem List   Diagnosis Date Noted  . Diabetic neuropathy, painful ([place]) 04/24/2016  . Left carotid artery stenosis   . Hypertriglyceridemia, essential 01/10/2016  . Cerebral infarction due to thrombosis of right posterior cerebral artery ([place]) 01/10/2016  . Type 2 diabetes mellitus with diabetic neuropathy, with long-term current use of insulin ([place]) 10/06/2015  . Unspecified deficiency anemia 03/10/2014  . Osteoporosis, unspecified 03/10/2014  . Left bundle branch block 07/01/2013  . Coronary atherosclerosis of native coronary artery 02/16/2013  . Chronic systolic heart failure ([place]) 08/16/2012  . Congestive dilated cardiomyopathy ([place]) 08/02/2012  . HTN (hypertension) 08/02/2012  . Familial tremor 11/29/2010  . VITAMIN D DEFICIENCY 04/04/2010  . Osteoarthrosis, unspecified whether generalized or localized, unspecified site 04/04/2010  . Hyperlipidemia with target LDL less than 70 01/19/2007  . Hypothyroidism 01/14/2007    Andrewjames Weirauch L 05/16/2016, 4:49 PM  [facility] [address] [place], [place], [address] Phone: [phone]   Fax:  [phone]  Name: [name] [name] MRN: [id number] Date of Birth: 1934/01/05  Geoffry Paradise, PT,DPT 05/16/16 4:49 PM Phone: [phone] Fax: [phone]

## 2016-05-16 NOTE — Therapy (Signed)
[facility] [address] [place], [place], [address] Phone: [phone]   Fax:  [phone]  Occupational Therapy Treatment  Patient Details  Name: [name] [name] MRN: [id number] Date of Birth: 1933/10/20 Referring Provider: Dr. Scarlette Calico  Encounter Date: 05/16/2016      OT End of Session - 05/16/16 1445    Visit Number 8   Number of Visits 16   Date for OT Re-Evaluation 06/03/16   Authorization Type medicare- will need g code and progress note every 10th visit   Authorization Time Period 60 days   Authorization - Visit Number 8   Authorization - Number of Visits 10   OT Start Time 1315   OT Stop Time 1358   OT Time Calculation (min) 43 min   Activity Tolerance Patient tolerated treatment well      Past Medical History:  Diagnosis Date  . Chronic combined systolic and diastolic CHF, NYHA class 3 ([place])    a. 03/2013 Echo: EF 25-30%, diff HK, sev antsept HK, mild MR.  . Congestive dilated cardiomyopathy ([place])    a. 03/2013 Echo: EF 25-30%;  b. 09/2013 s/p SJM 3242 CRT-P.  Marland Kitchen Coronary artery disease    a. 08/2012 Cath: LM nl, LAD 69m LCX nondom, mild-mod nonobs dzs mid, OM1 ok, OM2 90/90p, RCA 40, EF 25-30%-->Med Rx.  . GERD (gastroesophageal reflux disease)   . High cholesterol   . Hypertension   . Hypothyroidism   . Left bundle branch block   . Osteoarthritis   . Stroke ([place]   . Type II diabetes mellitus ([place]     Past Surgical History:  Procedure Laterality Date  . ABDOMINAL HYSTERECTOMY  1980  . BI-VENTRICULAR PACEMAKER INSERTION N/A 10/01/2013   Procedure: BI-VENTRICULAR PACEMAKER INSERTION (CRT-P);  Surgeon: SDeboraha Sprang MD;  [facility];  Service: Cardiovascular;  Laterality: N/A;  . BI-VENTRICULAR PACEMAKER INSERTION (CRT-P)     a. 09/2013 s/p SJM 3242 CRT-P.  . Bilateral cateract surgery    . BMD  2006  . ORIF TIBIA PLATEAU Right 12/04/2013   Procedure: OPEN REDUCTION INTERNAL  FIXATION (ORIF) TIBIAL PLATEAU;  Surgeon: SAugustin Schooling MD;  Location: [facility];  Service: Orthopedics;  Laterality: Right;  . RIGHT HEART CATHETERIZATION N/A 09/08/2012   Procedure: RIGHT HEART CATH;  Surgeon: DJolaine Artist MD;  [facility];  Service: Cardiovascular;  Laterality: N/A;    There were no vitals filed for this visit.      Subjective Assessment - 05/16/16 1321    Subjective  Son reports pt is much safer and more independent at home with walker.  "I can get around so much better."   Pertinent History see epic snapshot, CVA in 12/2015, diabetic neuropathy   Patient Stated Goals I want better energy   Currently in Pain? No/denies                      OT Treatments/Exercises (OP) - 05/16/16 0001      ADLs   Cooking Addressed simple hot meal prep at ambulatory level with rolling walker.  Pt required supervision with max vc's for safety, attending to L and balance until very end of activity. Pt reached too far to left instead of moving herself closer to work space and had significant LOB (therapist prevented fall).  Pt appeared to be unware that she was losing her balance.  When asked how she felt cooking went pt initially said "I  think I did fine," when cued about losing her balance pt then said "oh yeah I guess I did lose it" with no real understanding of why.  Pt would need very close supervision with potential occassional min a for balance, safety and sequencing with simple familar cooking activities at this time.  Pt tolerated 15 minutes of ambulatory activity before needing rest break. Pt needs max encouragement to work to fulll potential. Also addressed simple scanning activity related to reading - pt with min errors.                   OT Short Term Goals - 05/16/16 1323      OT SHORT TERM GOAL #1   Title Pt and family will be mod I with home activities program - 05/06/2016   Status Achieved  05/16/2016     OT SHORT TERM GOAL #2    Title Pt will need  min vc's for table top scanning activities with functional task    Status Achieved     OT SHORT TERM GOAL #3   Title Pt will be supervision for simple hot meal prep making familiar hot meal   Status Not Met  pt with 1 significant LOB with cooking and clean up activity     OT SHORT TERM GOAL #4   Title Pt will increase grip strength by at least 3 pounds to assist in functional tasks (baseline = 30 pounds)   Status Achieved     OT SHORT TERM GOAL #5   Title Pt will be mod I with bathing at shower level   Status Not Met           OT Long Term Goals - 05/16/16 1347      OT LONG TERM GOAL #1   Title Pt and family will be mod I with home activities program - 06/03/2016   Status On-going     OT LONG TERM GOAL #2   Title Pt will be min vc's for environmental scanning with familiar functional tasks   Status On-going     OT LONG TERM GOAL #3   Title Pt will increase grip strength in LUE by 5 pounds to assist with functioal activities (baseline= 30)   Status On-going     OT LONG TERM GOAL #4   Title Pt will tolerate 25 minutes of ambulatory activity with no more than 1 rest break   Status On-going     OT LONG TERM GOAL #5   Title Pt will be mod I with shower transfers   Status On-going               Plan - 05/16/16 1443    Clinical Impression Statement Pt progressing slowly toward goals. Pt and son state pt is much safer on walker.  Pt still occassionally has LOB due to decreased safety awareness, problem solving and L neglect.    Rehab Potential Good   OT Frequency 2x / week   OT Duration 8 weeks   OT Treatment/Interventions Self-care/ADL training;Moist Heat;DME and/or AE instruction;Energy conservation;Neuromuscular education;Therapeutic exercise;Therapist, nutritional;Therapeutic activities;Balance training;Patient/family education;Visual/perceptual remediation/compensation;Cognitive remediation/compensation   Plan balance, functional  ambulation with walker, safety, address scanning and L neglect functionally, activity tolerance.   Consulted and Agree with Plan of Care Patient      Patient will benefit from skilled therapeutic intervention in order to improve the following deficits and impairments:  Cardiopulmonary status limiting activity, Decreased activity tolerance, Decreased balance, Decreased cognition, Decreased knowledge of  use of DME, Decreased mobility, Decreased safety awareness, Difficulty walking, Decreased strength, Impaired UE functional use, Impaired sensation, Impaired vision/preception  Visit Diagnosis: Unsteadiness on feet  Hemiplegia and hemiparesis following unspecified cerebrovascular disease affecting left non-dominant side (HCC)  Other symptoms and signs involving the nervous system  Visuospatial deficit  Frontal lobe and executive function deficit    Problem List Patient Active Problem List   Diagnosis Date Noted  . Diabetic neuropathy, painful ([place]) 04/24/2016  . Left carotid artery stenosis   . Hypertriglyceridemia, essential 01/10/2016  . Cerebral infarction due to thrombosis of right posterior cerebral artery ([place]) 01/10/2016  . Type 2 diabetes mellitus with diabetic neuropathy, with long-term current use of insulin ([place]) 10/06/2015  . Unspecified deficiency anemia 03/10/2014  . Osteoporosis, unspecified 03/10/2014  . Left bundle branch block 07/01/2013  . Coronary atherosclerosis of native coronary artery 02/16/2013  . Chronic systolic heart failure ([place]) 08/16/2012  . Congestive dilated cardiomyopathy ([place]) 08/02/2012  . HTN (hypertension) 08/02/2012  . Familial tremor 11/29/2010  . VITAMIN D DEFICIENCY 04/04/2010  . Osteoarthrosis, unspecified whether generalized or localized, unspecified site 04/04/2010  . Hyperlipidemia with target LDL less than 70 01/19/2007  . Hypothyroidism 01/14/2007    Quay Burow, OTR/L 05/16/2016, 2:49 PM  [facility] [address] [place], [place], [address] Phone: [phone]   Fax:  [phone]  Name: [name] [name] MRN: [id number] Date of Birth: [birth year]

## 2016-05-20 ENCOUNTER — Ambulatory Visit (INDEPENDENT_AMBULATORY_CARE_PROVIDER_SITE_OTHER): Payer: Medicare Other | Admitting: Podiatry

## 2016-05-20 DIAGNOSIS — Q828 Other specified congenital malformations of skin: Secondary | ICD-10-CM | POA: Diagnosis not present

## 2016-05-20 DIAGNOSIS — E1040 Type 1 diabetes mellitus with diabetic neuropathy, unspecified: Secondary | ICD-10-CM | POA: Diagnosis not present

## 2016-05-20 DIAGNOSIS — E1151 Type 2 diabetes mellitus with diabetic peripheral angiopathy without gangrene: Secondary | ICD-10-CM

## 2016-05-20 DIAGNOSIS — M79609 Pain in unspecified limb: Principal | ICD-10-CM

## 2016-05-20 DIAGNOSIS — M79676 Pain in unspecified toe(s): Secondary | ICD-10-CM | POA: Diagnosis not present

## 2016-05-20 DIAGNOSIS — B351 Tinea unguium: Secondary | ICD-10-CM

## 2016-05-21 ENCOUNTER — Encounter: Payer: Self-pay | Admitting: Physical Therapy

## 2016-05-21 ENCOUNTER — Ambulatory Visit: Payer: Medicare Other

## 2016-05-21 ENCOUNTER — Ambulatory Visit: Payer: Medicare Other | Admitting: Occupational Therapy

## 2016-05-21 ENCOUNTER — Ambulatory Visit: Payer: Medicare Other | Admitting: Physical Therapy

## 2016-05-21 ENCOUNTER — Encounter: Payer: Self-pay | Admitting: Occupational Therapy

## 2016-05-21 DIAGNOSIS — R29818 Other symptoms and signs involving the nervous system: Secondary | ICD-10-CM

## 2016-05-21 DIAGNOSIS — R41841 Cognitive communication deficit: Secondary | ICD-10-CM | POA: Diagnosis not present

## 2016-05-21 DIAGNOSIS — R2689 Other abnormalities of gait and mobility: Secondary | ICD-10-CM

## 2016-05-21 DIAGNOSIS — R2681 Unsteadiness on feet: Secondary | ICD-10-CM

## 2016-05-21 DIAGNOSIS — I69954 Hemiplegia and hemiparesis following unspecified cerebrovascular disease affecting left non-dominant side: Secondary | ICD-10-CM

## 2016-05-21 DIAGNOSIS — R41844 Frontal lobe and executive function deficit: Secondary | ICD-10-CM

## 2016-05-21 DIAGNOSIS — R41842 Visuospatial deficit: Secondary | ICD-10-CM | POA: Diagnosis not present

## 2016-05-21 NOTE — Progress Notes (Signed)
Subjective:     Patient ID: [name] [name], female   DOB: 09-25-1933, 82 y.o.   MRN: [id number]  HPI patient presents with painful nails bilateral with thickness of the beds and keratotic lesions bilateral that are painful when palpated with diabetes as complicating factor   Review of Systems     Objective:   Physical Exam Neurovascular status unchanged with thick yellow brittle nailbeds 1-5 both feet and keratotic lesions sub-second and fourth metatarsals bilateral    Assessment:     At risk patient with painful mycotic nail disease and lesion formation    Plan:     Debride nailbeds bilateral and lesions with no iatrogenic bleeding and reappoint as needed

## 2016-05-21 NOTE — Therapy (Signed)
[facility] [address] [place], [place], [address] Phone: [phone]   Fax:  [phone]  Occupational Therapy Treatment  Patient Details  Name: [name] [name] MRN: [id number] Date of Birth: 1934/05/30 Referring Provider: Dr. Scarlette Calico  Encounter Date: 05/21/2016      OT End of Session - 05/21/16 1544    Visit Number 9   Number of Visits 16   Date for OT Re-Evaluation 06/03/16   Authorization Type medicare- will need g code and progress note every 10th visit   Authorization Time Period 60 days   Authorization - Visit Number 9   Authorization - Number of Visits 10   OT Start Time 4680   OT Stop Time 1530   OT Time Calculation (min) 41 min      Past Medical History:  Diagnosis Date  . Chronic combined systolic and diastolic CHF, NYHA class 3 ([place])    a. 03/2013 Echo: EF 25-30%, diff HK, sev antsept HK, mild MR.  . Congestive dilated cardiomyopathy ([place])    a. 03/2013 Echo: EF 25-30%;  b. 09/2013 s/p SJM 3242 CRT-P.  Marland Kitchen Coronary artery disease    a. 08/2012 Cath: LM nl, LAD 67m LCX nondom, mild-mod nonobs dzs mid, OM1 ok, OM2 90/90p, RCA 40, EF 25-30%-->Med Rx.  . GERD (gastroesophageal reflux disease)   . High cholesterol   . Hypertension   . Hypothyroidism   . Left bundle branch block   . Osteoarthritis   . Stroke ([facility]   . Type II diabetes mellitus ([place]     Past Surgical History:  Procedure Laterality Date  . ABDOMINAL HYSTERECTOMY  1980  . BI-VENTRICULAR PACEMAKER INSERTION N/A 10/01/2013   Procedure: BI-VENTRICULAR PACEMAKER INSERTION (CRT-P);  Surgeon: SDeboraha Sprang MD;  [facility];  Service: Cardiovascular;  Laterality: N/A;  . BI-VENTRICULAR PACEMAKER INSERTION (CRT-P)     a. 09/2013 s/p SJM 3242 CRT-P.  . Bilateral cateract surgery    . BMD  2006  . ORIF TIBIA PLATEAU Right 12/04/2013   Procedure: OPEN REDUCTION INTERNAL FIXATION (ORIF) TIBIAL PLATEAU;  Surgeon: SAugustin Schooling  MD;  [facility];  Service: Orthopedics;  Laterality: Right;  . RIGHT HEART CATHETERIZATION N/A 09/08/2012   Procedure: RIGHT HEART CATH;  Surgeon: DJolaine Artist MD;  [facility];  Service: Cardiovascular;  Laterality: N/A;    There were no vitals filed for this visit.      Subjective Assessment - 05/21/16 1450    Subjective  I fell after I got out of bed - I didn't have my walker or my cane.    Pertinent History see epic snapshot, CVA in 12/2015, diabetic neuropathy   Patient Stated Goals I want better energy   Currently in Pain? Yes   Pain Location Back   Pain Orientation Right   Pain Descriptors / Indicators Sore   Pain Type Acute pain   Pain Onset Yesterday   Aggravating Factors  not sure   Pain Relieving Factors not sure                      OT Treatments/Exercises (OP) - 05/21/16 0001      ADLs   Functional Mobility Pt reports today that she feel at home after getting out of bed. Pt reports she was not using walker or cane. Pt arrived today with cane.  Will clarify with family that pt should ONLY be using walker for safety at home  especially given cognitive and perceptual deficits. Pt needs mod cues to navigate in unfamiliar environment due to L neglect and decreased balance as well as impaired cognition.  Treatment focused on pt's ability to find her way around clinic with cues to attend to the left and to navigate around obstacles.  Pt with signifcant L neglect.       Exercises   Exercises Hand     Hand Exercises   Digit Composite ABduction Limitations --   Theraputty - Locate Pegs 15 pegs in red putty with peg board placed to the left to encourage attending to the left side of the environment. Pt unable to state if she has been compliant with theraputty - suspect due to cognitive deficits that pt has not been completing - will reinforce with family again the need to help structure home activities with pt.    Hand Gripper with Small Beads  Gripper on #1 to pick up one inch blocks.  Blocks placed to pt's far left to encourage atttennding to the left.  Pt needed mod cues nitially to find blocks to the left. Pt able to complete with min difficulty and dropping.                    OT Short Term Goals - 05/21/16 1455      OT SHORT TERM GOAL #1   Title Pt and family will be mod I with home activities program - 05/06/2016   Status Achieved  05/16/2016     OT SHORT TERM GOAL #2   Title Pt will need  min vc's for table top scanning activities with functional task    Status Achieved     OT SHORT TERM GOAL #3   Title Pt will be supervision for simple hot meal prep making familiar hot meal   Status Not Met  pt with 1 significant LOB with cooking and clean up activity     OT SHORT TERM GOAL #4   Title Pt will increase grip strength by at least 3 pounds to assist in functional tasks (baseline = 30 pounds)   Status Achieved     OT SHORT TERM GOAL #5   Title Pt will be mod I with bathing at shower level   Status Achieved  05/21/2016 per pt report           OT Long Term Goals - 05/21/16 1455      OT LONG TERM GOAL #1   Title Pt and family will be mod I with home activities program - 06/03/2016   Status On-going     OT LONG TERM GOAL #2   Title Pt will be min vc's for environmental scanning with familiar functional tasks   Status On-going     OT LONG TERM GOAL #3   Title Pt will increase grip strength in LUE by 5 pounds to assist with functioal activities (baseline= 30)   Status On-going     OT LONG TERM GOAL #4   Title Pt will tolerate 25 minutes of ambulatory activity with no more than 1 rest break   Status On-going     OT LONG TERM GOAL #5   Title Pt will be mod S with shower transfers   Status Revised               Plan - 05/21/16 1542    Clinical Impression Statement Pt with slow progress toward goals.  Pt with significant L neglect and impaired cognition therefore requires  increased time  and significant repetition.    Rehab Potential Good   Clinical Impairments Affecting Rehab Potential L neglect, significant cognitive deficits   OT Frequency 2x / week   OT Duration 8 weeks   OT Treatment/Interventions Self-care/ADL training;Moist Heat;DME and/or AE instruction;Energy conservation;Neuromuscular education;Therapeutic exercise;Therapist, nutritional;Therapeutic activities;Balance training;Patient/family education;Visual/perceptual remediation/compensation;Cognitive remediation/compensation   Plan balance, functional tasks with RW, safety, scanning and L neglect functionally, activity tolerance, grip strength   Consulted and Agree with Plan of Care Patient   Family Member Consulted son Maudie Mercury      Patient will benefit from skilled therapeutic intervention in order to improve the following deficits and impairments:  Cardiopulmonary status limiting activity, Decreased activity tolerance, Decreased balance, Decreased cognition, Decreased knowledge of use of DME, Decreased mobility, Decreased safety awareness, Difficulty walking, Decreased strength, Impaired UE functional use, Impaired sensation, Impaired vision/preception  Visit Diagnosis: Hemiplegia and hemiparesis following unspecified cerebrovascular disease affecting left non-dominant side (HCC)  Unsteadiness on feet  Other symptoms and signs involving the nervous system  Visuospatial deficit  Frontal lobe and executive function deficit    Problem List Patient Active Problem List   Diagnosis Date Noted  . Diabetic neuropathy, painful ([place]) 04/24/2016  . Left carotid artery stenosis   . Hypertriglyceridemia, essential 01/10/2016  . Cerebral infarction due to thrombosis of right posterior cerebral artery ([place]) 01/10/2016  . Type 2 diabetes mellitus with diabetic neuropathy, with long-term current use of insulin ([place]) 10/06/2015  . Unspecified deficiency anemia 03/10/2014  . Osteoporosis, unspecified 03/10/2014  .  Left bundle branch block 07/01/2013  . Coronary atherosclerosis of native coronary artery 02/16/2013  . Chronic systolic heart failure ([place]) 08/16/2012  . Congestive dilated cardiomyopathy ([place]) 08/02/2012  . HTN (hypertension) 08/02/2012  . Familial tremor 11/29/2010  . VITAMIN D DEFICIENCY 04/04/2010  . Osteoarthrosis, unspecified whether generalized or localized, unspecified site 04/04/2010  . Hyperlipidemia with target LDL less than 70 01/19/2007  . Hypothyroidism 01/14/2007    Quay Burow, OTR/L 05/21/2016, 3:47 PM  [facility] [address] [place], [place], [address] Phone: [phone]   Fax:  [phone]  Name: [name] [name] MRN: [id number] Date of Birth: 01-Jun-1934

## 2016-05-21 NOTE — Therapy (Signed)
[facility] [address] [place], [place], [address] Phone: [phone]   Fax:  [phone]  Physical Therapy Treatment  Patient Details  Name: [name] [name] MRN: [id number] Date of Birth: January 30, 1934 Referring Provider: Scarlette Calico, MD PCP (Dr. Leonie Man, neurologist)  Encounter Date: 05/21/2016      PT End of Session - 05/21/16 1701    Visit Number 12   Number of Visits 18   Date for PT Re-Evaluation 05/31/16   Authorization Type Medicare G-code, TRICARE   PT Start Time 1409   PT Stop Time 1448   PT Time Calculation (min) 39 min   Equipment Utilized During Treatment Gait belt   Activity Tolerance Patient limited by fatigue;Patient tolerated treatment well   Behavior During Therapy [facility] for tasks assessed/performed      Past Medical History:  Diagnosis Date  . Chronic combined systolic and diastolic CHF, NYHA class 3 ([place])    a. 03/2013 Echo: EF 25-30%, diff HK, sev antsept HK, mild MR.  . Congestive dilated cardiomyopathy ([place])    a. 03/2013 Echo: EF 25-30%;  b. 09/2013 s/p SJM 3242 CRT-P.  Marland Kitchen Coronary artery disease    a. 08/2012 Cath: LM nl, LAD 66m LCX nondom, mild-mod nonobs dzs mid, OM1 ok, OM2 90/90p, RCA 40, EF 25-30%-->Med Rx.  . GERD (gastroesophageal reflux disease)   . High cholesterol   . Hypertension   . Hypothyroidism   . Left bundle branch block   . Osteoarthritis   . Stroke ([place]   . Type II diabetes mellitus ([place]     Past Surgical History:  Procedure Laterality Date  . ABDOMINAL HYSTERECTOMY  1980  . BI-VENTRICULAR PACEMAKER INSERTION N/A 10/01/2013   Procedure: BI-VENTRICULAR PACEMAKER INSERTION (CRT-P);  Surgeon: SDeboraha Sprang MD;  [facility];  Service: Cardiovascular;  Laterality: N/A;  . BI-VENTRICULAR PACEMAKER INSERTION (CRT-P)     a. 09/2013 s/p SJM 3242 CRT-P.  . Bilateral cateract surgery    . BMD  2006  . ORIF TIBIA PLATEAU Right 12/04/2013   Procedure: OPEN REDUCTION  INTERNAL FIXATION (ORIF) TIBIAL PLATEAU;  Surgeon: SAugustin Schooling MD;  [facility];  Service: Orthopedics;  Laterality: Right;  . RIGHT HEART CATHETERIZATION N/A 09/08/2012   Procedure: RIGHT HEART CATH;  Surgeon: DJolaine Artist MD;  [facility];  Service: Cardiovascular;  Laterality: N/A;    There were no vitals filed for this visit.      Subjective Assessment - 05/21/16 1416    Subjective Pt reports falling out of bed last night trying to go to the bathroom and c/o pain in the low back.  She did not hit her head and has no other complaints at this time.   Patient is accompained by: --  Son, Ken   Limitations Standing;Walking;Lifting   Patient Stated Goals To walk and balance better. Build stamina.    Currently in Pain? Yes   Pain Score 5    Pain Location Back   Pain Type Acute pain   Pain Onset Yesterday   Multiple Pain Sites No                         [facility] Adult PT Treatment/Exercise - 05/21/16 1400      Transfers   Transfers Sit to Stand;Stand to SLockheed MartinTransfers   Sit to Stand 5: Supervision;With upper extremity assist;With armrests;From chair/3-in-1   Sit to Stand Details (indicate cue type and  reason) Verbal cuing to reach back for chair with hands and avoid using RW to help pull herself up and to push from arm rests of the chair.   Stand to Sit 5: Supervision;With upper extremity assist;With armrests;To chair/3-in-1   Stand to Sit Details Verbal cuing to turn all the way around with RW before beginning to sit and to let go of RW and reach back for chair when sitting.     Ambulation/Gait   Ambulation/Gait Yes   Ambulation/Gait Assistance 5: Supervision;4: Min guard   Ambulation/Gait Assistance Details Verbal cues for pt to stay inside the RW rather than reaching out in front while pushing creating incr truck flexion.  Pt cued to step inside the RW each step.  Pt cued to scan environment to both sides, especially the L, to find  letters and numbers on wall in hallway.   Ambulation Distance (Feet) 250 Feet  2x250', 1x100', 1x90'   Assistive device Rolling walker;Straight cane   Gait Pattern Step-through pattern;Decreased arm swing - right;Decreased stride length;Decreased hip/knee flexion - right;Decreased hip/knee flexion - left;Decreased dorsiflexion - right;Decreased dorsiflexion - left;Shuffle;Trunk flexed;Narrow base of support   Ambulation Surface Level;Indoor   Ramp 5: Supervision   Ramp Details (indicate cue type and reason) Verbal cuing for sequencing as well as foot, hand, and RW placement when negotiating ramp.   Curb 5: Supervision   Curb Details (indicate cue type and reason) Verbal cues for sequencing as well as foot/RW placement when ascending and descending curb.     Neuro Re-ed    Neuro Re-ed Details  Pt able to locate items needed for setting a table (plates, forks, spoons, and knives) and place on table without LOB and with verbal cuing to use RW when negotiating around table.  Pt was required to reach above her head and below waist to find and place items in order to challenge her functional balance with the RW.                PT Education - 05/21/16 1428    Education provided Yes   Education Details Pt educated on sitting and standing avoiding placeing hands on the RW to stand or sit.  Pt also instructed to use RW full time at home and in the community.   Person(s) Educated Patient   Methods Explanation;Demonstration;Verbal cues   Comprehension Verbalized understanding;Returned demonstration          PT Short Term Goals - 05/08/16 1413      PT SHORT TERM GOAL #1   Title Patient with family cues demonstrates understanding of [place] HEP. (Target Date: 05/08/2016)   Baseline GOAL MET: 05/08/16   Time 1   Period Months   Status Achieved     PT SHORT TERM GOAL #2   Title Timed Up-Go <15sec with supervision. (Target Date: 05/08/2016)   Baseline TUG: 18.87sec; 05/08/16   Time 1    Period Months   Status On-going     PT SHORT TERM GOAL #3   Title Standing balance turning head to look to side and reaches 5" & to floor with supervision. (Target Date: 05/08/2016)   Baseline GOAL MET: 05/08/16   Time 1   Period Months   Status Achieved     PT SHORT TERM GOAL #4   Title Patient ambulates 250' around obstacles with LRAD with supervision. (Target Date: 05/08/2016)   Baseline GOAL MET: 05/08/16   Time 1   Period Months   Status Achieved  PT SHORT TERM GOAL #5   Title Cognitive TUG <25sec with simple naming task.  (Target Date: 05/08/2016)   Baseline Cog TUG: 45.34sec; 05/08/16   Time 1   Period Months   Status On-going           PT Long Term Goals - 04/11/16 1557      PT LONG TERM GOAL #1   Title Patient and family demonstrate & verbalize understanding of ongoing fitness plan & HEP. (Target Date: 05/31/2016)   Time 2   Period Months   Status On-going     PT LONG TERM GOAL #2   Title Timed Up-Go standard <13.5sec safely without device. (Target Date: 05/31/2016)   Time 2   Period Months   Status On-going     PT LONG TERM GOAL #3   Title Berg Balance >36/56 to indicate lower fall risk. (Target Date: 05/31/2016)   Time 2   Period Months   Status On-going     PT LONG TERM GOAL #4   Title Dynamic Gait Index >/= 10/24 to indicate lower fall risk. (Target Date: 05/31/2016)   Time 2   Period Months   Status On-going     PT LONG TERM GOAL #5   Title Patient ambulates 500' with LRAD with supervision for cognitive deficits no balance losses safely. (Target Date: 05/31/2016)   Time 2   Period Months   Status On-going     PT LONG TERM GOAL #6   Title Patient negotiates ramps, curbs & stairs (1 rail) with LRAD with supervision from family safely. (Target Date: 05/31/2016)   Time 2   Period Months   Status On-going     PT LONG TERM GOAL #7   Title Patient ambulates around furniture with LRAD modified independent for household mobility. (Target Date:  05/31/2016)   Time 2   Period Months   Status On-going               Plan - 05/21/16 1702    Clinical Impression Statement Pt performed well with the focus being on encouraging scanning the environment while maintaining the same speed with ambulation.  Pt was able to find letters and numbers in hallway with an emphasis on scanning to the L and improved in efficiency as activity progressed.  She was also able to correctly and safely find items needed to set a table in a kitchen while maintaining balance, although needed cuing to bring RW with her as she moved along the countertop.  Pt's quality and speed of gait is significantly improved when using the RW rather than SPC.  She demosntrates incr stability and is able to negotiate turns,, ramps, and curbs with incr safety.  She would continue to benefit from skilled PT to continue instruction and practice with RW while scanning the environment.   Rehab Potential Good   PT Frequency 2x / week   PT Duration Other (comment)   PT Treatment/Interventions ADLs/Self Care Home Management;DME Instruction;Gait training;Stair training;Functional mobility training;Therapeutic activities;Therapeutic exercise;Balance training;Neuromuscular re-education;Patient/family education;Orthotic Fit/Training;Vestibular   PT Next Visit Plan Continue gait training (with RW) with cognitive tasks (and attending to L side), large amplitude movements   Consulted and Agree with Plan of Care Patient      Patient will benefit from skilled therapeutic intervention in order to improve the following deficits and impairments:  Abnormal gait, Decreased activity tolerance, Decreased balance, Decreased coordination, Decreased endurance, Decreased mobility, Decreased safety awareness, Decreased strength, Impaired vision/preception, Postural dysfunction  Visit Diagnosis: Hemiplegia  and hemiparesis following unspecified cerebrovascular disease affecting left non-dominant side  (HCC)  Unsteadiness on feet  Other abnormalities of gait and mobility     Problem List Patient Active Problem List   Diagnosis Date Noted  . Diabetic neuropathy, painful ([place]) 04/24/2016  . Left carotid artery stenosis   . Hypertriglyceridemia, essential 01/10/2016  . Cerebral infarction due to thrombosis of right posterior cerebral artery ([place]) 01/10/2016  . Type 2 diabetes mellitus with diabetic neuropathy, with long-term current use of insulin ([place]) 10/06/2015  . Unspecified deficiency anemia 03/10/2014  . Osteoporosis, unspecified 03/10/2014  . Left bundle branch block 07/01/2013  . Coronary atherosclerosis of native coronary artery 02/16/2013  . Chronic systolic heart failure ([place]) 08/16/2012  . Congestive dilated cardiomyopathy ([place]) 08/02/2012  . HTN (hypertension) 08/02/2012  . Familial tremor 11/29/2010  . VITAMIN D DEFICIENCY 04/04/2010  . Osteoarthrosis, unspecified whether generalized or localized, unspecified site 04/04/2010  . Hyperlipidemia with target LDL less than 70 01/19/2007  . Hypothyroidism 01/14/2007    [name] [name], SPT 05/21/2016, 5:18 PM  [facility] [address] [place], [place], [address] Phone: [phone]   Fax:  [phone]  Name: [name] [name] MRN: [id number] Date of Birth: 1933/12/24

## 2016-05-23 ENCOUNTER — Ambulatory Visit: Payer: Medicare Other

## 2016-05-23 ENCOUNTER — Encounter: Payer: Medicare Other | Admitting: *Deleted

## 2016-05-23 ENCOUNTER — Encounter: Payer: Self-pay | Admitting: Occupational Therapy

## 2016-05-23 ENCOUNTER — Ambulatory Visit: Payer: Medicare Other | Admitting: Occupational Therapy

## 2016-05-23 DIAGNOSIS — R2681 Unsteadiness on feet: Secondary | ICD-10-CM | POA: Diagnosis not present

## 2016-05-23 DIAGNOSIS — R2689 Other abnormalities of gait and mobility: Secondary | ICD-10-CM

## 2016-05-23 DIAGNOSIS — I69954 Hemiplegia and hemiparesis following unspecified cerebrovascular disease affecting left non-dominant side: Secondary | ICD-10-CM

## 2016-05-23 DIAGNOSIS — R41842 Visuospatial deficit: Secondary | ICD-10-CM

## 2016-05-23 DIAGNOSIS — R41844 Frontal lobe and executive function deficit: Secondary | ICD-10-CM | POA: Diagnosis not present

## 2016-05-23 DIAGNOSIS — R29818 Other symptoms and signs involving the nervous system: Secondary | ICD-10-CM | POA: Diagnosis not present

## 2016-05-23 DIAGNOSIS — R41841 Cognitive communication deficit: Secondary | ICD-10-CM | POA: Diagnosis not present

## 2016-05-23 NOTE — Therapy (Signed)
[facility] [address] [place], [place], [address] Phone: [phone]   Fax:  [phone]  Occupational Therapy Treatment  Patient Details  Name: Khamryn Calderone MRN: [id number] Date of Birth: Aug 18, 1933 Referring Provider: Dr. Sanda Linger  Encounter Date: 05/23/2016      OT End of Session - 05/23/16 1752    Visit Number 10   Number of Visits 16   Date for OT Re-Evaluation 06/03/16   Authorization Type medicare- will need g code and progress note every 10th visit   Authorization Time Period 60 days   Authorization - Visit Number 10   Authorization - Number of Visits 10   OT Start Time 1401   OT Stop Time 1443   OT Time Calculation (min) 42 min   Activity Tolerance Patient tolerated treatment well      Past Medical History:  Diagnosis Date  . Chronic combined systolic and diastolic CHF, NYHA class 3 (HCC)    a. 03/2013 Echo: EF 25-30%, diff HK, sev antsept HK, mild MR.  . Congestive dilated cardiomyopathy (HCC)    a. 03/2013 Echo: EF 25-30%;  b. 09/2013 s/p SJM 3242 CRT-P.  Marland Kitchen Coronary artery disease    a. 08/2012 Cath: LM nl, LAD 80m, LCX nondom, mild-mod nonobs dzs mid, OM1 ok, OM2 90/90p, RCA 40, EF 25-30%-->Med Rx.  . GERD (gastroesophageal reflux disease)   . High cholesterol   . Hypertension   . Hypothyroidism   . Left bundle branch block   . Osteoarthritis   . Stroke (HCC)   . Type II diabetes mellitus (HCC)     Past Surgical History:  Procedure Laterality Date  . ABDOMINAL HYSTERECTOMY  1980  . BI-VENTRICULAR PACEMAKER INSERTION N/A 10/01/2013   Procedure: BI-VENTRICULAR PACEMAKER INSERTION (CRT-P);  Surgeon: Duke Salvia, MD;  [facility];  Service: Cardiovascular;  Laterality: N/A;  . BI-VENTRICULAR PACEMAKER INSERTION (CRT-P)     a. 09/2013 s/p SJM 3242 CRT-P.  . Bilateral cateract surgery    . BMD  2006  . ORIF TIBIA PLATEAU Right 12/04/2013   Procedure: OPEN REDUCTION INTERNAL  FIXATION (ORIF) TIBIAL PLATEAU;  Surgeon: Verlee Rossetti, MD;  Location[facility];  Service: Orthopedics;  Laterality: Right;  . RIGHT HEART CATHETERIZATION N/A 09/08/2012   Procedure: RIGHT HEART CATH;  Surgeon: Dolores Patty, MD;  Location: [facility] CATH LAB;  Service: Cardiovascular;  Laterality: N/A;    There were no vitals filed for this visit.      Subjective Assessment - 05/23/16 1407    Subjective  I don't feel very well today   Pertinent History see epic snapshot, CVA in 12/2015, diabetic neuropathy   Patient Stated Goals I want better energy   Currently in Pain? Yes   Pain Score 7    Pain Location Back   Pain Orientation Right;Lower   Pain Descriptors / Indicators Sore   Pain Type Acute pain   Pain Onset In the past 7 days   Pain Frequency Intermittent   Aggravating Factors  when I move   Pain Relieving Factors sitting still, resting   Multiple Pain Sites No                      OT Treatments/Exercises (OP) - 05/23/16 0001      ADLs   Functional Mobility Addressed functional mobility with emphasis on dynamic standing balance, scanning especially to the left with functional activities, activity tolerance, safety and problem solving. Pt  demonstrates improvement with practice - initially required max cues for scanning but at end of session only requiring min vc's. Pt with improving ability to navigate unfamiliar environment and had no LOB using walker today.  Pt tolerated standing activity for 15 minutes.    ADL Comments Pt with 37# of grip strength today - see goals for update             Balance Exercises - 05/23/16 1514      Balance Exercises: Standing   Other Standing Exercises Performed with min guard to min A for safety and to maintain balance: pt performed cone taps (single) with BLE x4 reps/LE with cues for technique and decr. proprioception noted during L foot cone taps.              OT Short Term Goals - 05/23/16 1751      OT SHORT TERM  GOAL #1   Title Pt and family will be mod I with home activities program - 05/06/2016   Status Achieved  05/16/2016     OT SHORT TERM GOAL #2   Title Pt will need  min vc's for table top scanning activities with functional task    Status Achieved     OT SHORT TERM GOAL #3   Title Pt will be supervision for simple hot meal prep making familiar hot meal   Status Not Met  pt with 1 significant LOB with cooking and clean up activity     OT SHORT TERM GOAL #4   Title Pt will increase grip strength by at least 3 pounds to assist in functional tasks (baseline = 30 pounds)   Status Achieved     OT SHORT TERM GOAL #5   Title Pt will be mod I with bathing at shower level   Status Achieved  05/21/2016 per pt report           OT Long Term Goals - 05/23/16 1410      OT LONG TERM GOAL #1   Title Pt and family will be mod I with home activities program - 06/03/2016   Status On-going     OT LONG TERM GOAL #2   Title Pt will be min vc's for environmental scanning with familiar functional tasks   Status On-going     OT LONG TERM GOAL #3   Title Pt will increase grip strength in LUE by 5 pounds to assist with functioal activities (baseline= 30)   Status Achieved  37 pounds     OT LONG TERM GOAL #4   Title Pt will tolerate 15 minutes of ambulatory activity with no more than 1 rest break   Status Revised  Pt states "I don't want to work on that goal - 25 minutes is too long."  Goal revised to 15 minutes and pt in agreement     OT LONG TERM GOAL #5   Title Pt will be mod S with shower transfers   Status Achieved  pt reports that her son stands in next to her when she gets into shower but doesn't actually touch her.               Plan - 05/23/16 1751    Clinical Impression Statement Pt progressing today with scanning to the left and functional mobility.     Rehab Potential Good   Clinical Impairments Affecting Rehab Potential L neglect, significant cognitive deficits   OT  Frequency 2x / week   OT Duration 8 weeks  Plan balance, functional tasks at ambulatory level, safety, scanning, activity tolerance,    Consulted and Agree with Plan of Care Patient      Patient will benefit from skilled therapeutic intervention in order to improve the following deficits and impairments:  Cardiopulmonary status limiting activity, Decreased activity tolerance, Decreased balance, Decreased cognition, Decreased knowledge of use of DME, Decreased mobility, Decreased safety awareness, Difficulty walking, Decreased strength, Impaired UE functional use, Impaired sensation, Impaired vision/preception  Visit Diagnosis: Hemiplegia and hemiparesis following unspecified cerebrovascular disease affecting left non-dominant side (HCC)  Unsteadiness on feet  Other symptoms and signs involving the nervous system  Visuospatial deficit  Frontal lobe and executive function deficit      G-Codes - 05/25/2016 1753    Functional Assessment Tool Used skilled clinical observation   Functional Limitation Self care   Self Care Current Status (E3200) At least 60 percent but less than 80 percent impaired, limited or restricted   Self Care Goal Status (V7944) At least 20 percent but less than 40 percent impaired, limited or restricted      Problem List Patient Active Problem List   Diagnosis Date Noted  . Diabetic neuropathy, painful ([place]) 04/24/2016  . Left carotid artery stenosis   . Hypertriglyceridemia, essential 01/10/2016  . Cerebral infarction due to thrombosis of right posterior cerebral artery ([place]) 01/10/2016  . Type 2 diabetes mellitus with diabetic neuropathy, with long-term current use of insulin ([place]) 10/06/2015  . Unspecified deficiency anemia 03/10/2014  . Osteoporosis, unspecified 03/10/2014  . Left bundle branch block 07/01/2013  . Coronary atherosclerosis of native coronary artery 02/16/2013  . Chronic systolic heart failure ([place]) 08/16/2012  . Congestive dilated  cardiomyopathy ([place]) 08/02/2012  . HTN (hypertension) 08/02/2012  . Familial tremor 11/29/2010  . VITAMIN D DEFICIENCY 04/04/2010  . Osteoarthrosis, unspecified whether generalized or localized, unspecified site 04/04/2010  . Hyperlipidemia with target LDL less than 70 01/19/2007  . Hypothyroidism 01/14/2007  Occupational Therapy Progress Note  Dates of Reporting Period: 04/08/2016 to 05-25-2016  Objective Reports of Subjective Statement: see above  Objective Measurements: see above  Goal Update: see above  Plan: see above  Reason Skilled Services are Required: see above  Quay Burow, OTR/L May 25, 2016, 5:54 PM  [facility] [address] [place], [place], [address] Phone: [phone]   Fax:  [phone]  Name: Allanna Bresee MRN: [id number] Date of Birth: February 15, 1934

## 2016-05-23 NOTE — Therapy (Signed)
[facility] [address] [place], [place], [address] Phone: [phone]   Fax:  [phone]  Physical Therapy Treatment  Patient Details  Name: [name] [name] MRN: [id number] Date of Birth: March 20, 1934 Referring Provider: Sanda Linger, MD PCP (Dr. Pearlean Brownie, neurologist)  Encounter Date: 05/23/2016      PT End of Session - 05/23/16 1654    Visit Number 13   Number of Visits 18   Date for PT Re-Evaluation 05/31/16   Authorization Type Medicare G-code, TRICARE   PT Start Time 1448   PT Stop Time 1529   PT Time Calculation (min) 41 min   Equipment Utilized During Treatment Gait belt   Activity Tolerance Patient tolerated treatment well   Behavior During Therapy [facility] for tasks assessed/performed      Past Medical History:  Diagnosis Date  . Chronic combined systolic and diastolic CHF, NYHA class 3 (HCC)    a. 03/2013 Echo: EF 25-30%, diff HK, sev antsept HK, mild MR.  . Congestive dilated cardiomyopathy (HCC)    a. 03/2013 Echo: EF 25-30%;  b. 09/2013 s/p SJM 3242 CRT-P.  Marland Kitchen Coronary artery disease    a. 08/2012 Cath: LM nl, LAD 58m, LCX nondom, mild-mod nonobs dzs mid, OM1 ok, OM2 90/90p, RCA 40, EF 25-30%-->Med Rx.  . GERD (gastroesophageal reflux disease)   . High cholesterol   . Hypertension   . Hypothyroidism   . Left bundle branch block   . Osteoarthritis   . Stroke (HCC)   . Type II diabetes mellitus (HCC)     Past Surgical History:  Procedure Laterality Date  . ABDOMINAL HYSTERECTOMY  1980  . BI-VENTRICULAR PACEMAKER INSERTION N/A 10/01/2013   Procedure: BI-VENTRICULAR PACEMAKER INSERTION (CRT-P);  Surgeon: Duke Salvia, MD;  Location: [facility] CATH LAB;  Service: Cardiovascular;  Laterality: N/A;  . BI-VENTRICULAR PACEMAKER INSERTION (CRT-P)     a. 09/2013 s/p SJM 3242 CRT-P.  . Bilateral cateract surgery    . BMD  2006  . ORIF TIBIA PLATEAU Right 12/04/2013   Procedure: OPEN REDUCTION INTERNAL FIXATION (ORIF)  TIBIAL PLATEAU;  Surgeon: Verlee Rossetti, MD;  Location: [facility];  Service: Orthopedics;  Laterality: Right;  . RIGHT HEART CATHETERIZATION N/A 09/08/2012   Procedure: RIGHT HEART CATH;  Surgeon: Dolores Patty, MD;  Location: [facility] CATH LAB;  Service: Cardiovascular;  Laterality: N/A;    There were no vitals filed for this visit.      Subjective Assessment - 05/23/16 1451    Subjective Pt denied fall since last visit. Pt reports she still has LBP during amb. since fall. Pt reports 5/10 LBP during amb and 0/10 at rest.    Limitations Standing;Walking;Lifting   Patient Stated Goals To walk and balance better. Build stamina.    Currently in Pain? No/denies                         [facility] Adult PT Treatment/Exercise - 05/23/16 1453      Ambulation/Gait   Ambulation/Gait Yes   Ambulation/Gait Assistance 5: Supervision   Ambulation/Gait Assistance Details Pt amb. while performing cognitive tasks (naming cards held by PT in L viewing plane and naming foods according to the alphabet). Minimal cues for several letters and required two seated rest breaks 2/2 fatigue.  Cues to improve upright posture, heel strike, gait speed, and to stay on blue track as pt tends to veer towards R side.    Ambulation Distance (Feet) 117 Feet  x6 and 117'x3   Assistive device Rolling walker   Gait Pattern Step-through pattern;Decreased arm swing - right;Decreased stride length;Decreased hip/knee flexion - right;Decreased hip/knee flexion - left;Decreased dorsiflexion - right;Decreased dorsiflexion - left;Shuffle;Trunk flexed;Narrow base of support   Ambulation Surface Level;Indoor     High Level Balance   High Level Balance Activities Backward walking   High Level Balance Comments Performed in // bars with min guard for safety and cues for technique.             Balance Exercises - 05/23/16 1514      Balance Exercises: Standing   Other Standing Exercises Performed with min guard to min A  for safety and to maintain balance: pt performed cone taps (single) with BLE x4 reps/LE with cues for technique and decr. proprioception noted during L foot cone taps.            PT Education - 05/23/16 1658    Education provided Yes   Education Details At end of session, pt's son asked about a brace for R LE and PT educated pt and son on the importance of building pt's strength to improve gait deviations, and that we could assess R knee next session.   Person(s) Educated Patient;Child(ren)   Methods Explanation   Comprehension Verbalized understanding          PT Short Term Goals - 05/08/16 1413      PT SHORT TERM GOAL #1   Title Patient with family cues demonstrates understanding of [place] HEP. (Target Date: 05/08/2016)   Baseline GOAL MET: 05/08/16   Time 1   Period Months   Status Achieved     PT SHORT TERM GOAL #2   Title Timed Up-Go <15sec with supervision. (Target Date: 05/08/2016)   Baseline TUG: 18.87sec; 05/08/16   Time 1   Period Months   Status On-going     PT SHORT TERM GOAL #3   Title Standing balance turning head to look to side and reaches 5" & to floor with supervision. (Target Date: 05/08/2016)   Baseline GOAL MET: 05/08/16   Time 1   Period Months   Status Achieved     PT SHORT TERM GOAL #4   Title Patient ambulates 250' around obstacles with LRAD with supervision. (Target Date: 05/08/2016)   Baseline GOAL MET: 05/08/16   Time 1   Period Months   Status Achieved     PT SHORT TERM GOAL #5   Title Cognitive TUG <25sec with simple naming task.  (Target Date: 05/08/2016)   Baseline Cog TUG: 45.34sec; 05/08/16   Time 1   Period Months   Status On-going           PT Long Term Goals - 04/11/16 1557      PT LONG TERM GOAL #1   Title Patient and family demonstrate & verbalize understanding of ongoing fitness plan & HEP. (Target Date: 05/31/2016)   Time 2   Period Months   Status On-going     PT LONG TERM GOAL #2   Title Timed Up-Go standard  <13.5sec safely without device. (Target Date: 05/31/2016)   Time 2   Period Months   Status On-going     PT LONG TERM GOAL #3   Title Berg Balance >36/56 to indicate lower fall risk. (Target Date: 05/31/2016)   Time 2   Period Months   Status On-going     PT LONG TERM GOAL #4   Title Dynamic Gait Index >/= 10/24 to indicate  lower fall risk. (Target Date: 05/31/2016)   Time 2   Period Months   Status On-going     PT LONG TERM GOAL #5   Title Patient ambulates 500' with LRAD with supervision for cognitive deficits no balance losses safely. (Target Date: 05/31/2016)   Time 2   Period Months   Status On-going     PT LONG TERM GOAL #6   Title Patient negotiates ramps, curbs & stairs (1 rail) with LRAD with supervision from family safely. (Target Date: 05/31/2016)   Time 2   Period Months   Status On-going     PT LONG TERM GOAL #7   Title Patient ambulates around furniture with LRAD modified independent for household mobility. (Target Date: 05/31/2016)   Time 2   Period Months   Status On-going               Plan - 05/23/16 1654    Clinical Impression Statement Pt demonstrated progress, as she was able to amb. longer distances while performing cognitive tasks. Pt also demonstrated improved stride length and heel strike after backwards amb. balance training in // bars. However, pt continued to require cues to stay within RW and to improve upright posture. Pt continues to require cues to scan enviroment and to avoid obstacles with RW. Pt's endurance was improved this session, as she required less seated rest breaks. Continue with POC.   Rehab Potential Good   PT Frequency 2x / week   PT Duration Other (comment)   PT Treatment/Interventions ADLs/Self Care Home Management;DME Instruction;Gait training;Stair training;Functional mobility training;Therapeutic activities;Therapeutic exercise;Balance training;Neuromuscular re-education;Patient/family education;Orthotic  Fit/Training;Vestibular   PT Next Visit Plan Begin to assess goals and either d/c or renew based on progress.    Consulted and Agree with Plan of Care Patient      Patient will benefit from skilled therapeutic intervention in order to improve the following deficits and impairments:  Abnormal gait, Decreased activity tolerance, Decreased balance, Decreased coordination, Decreased endurance, Decreased mobility, Decreased safety awareness, Decreased strength, Impaired vision/preception, Postural dysfunction  Visit Diagnosis: Other abnormalities of gait and mobility  Hemiplegia and hemiparesis following unspecified cerebrovascular disease affecting left non-dominant side (HCC)  Unsteadiness on feet     Problem List Patient Active Problem List   Diagnosis Date Noted  . Diabetic neuropathy, painful (HCC) 04/24/2016  . Left carotid artery stenosis   . Hypertriglyceridemia, essential 01/10/2016  . 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 Type 2 diabetes mellitus with diabetic neuropathy, with long-term current use of insulin (HCC) 10/06/2015  . Unspecified deficiency anemia 03/10/2014  . Osteoporosis, unspecified 03/10/2014  . Left bundle branch block 07/01/2013  . Coronary atherosclerosis of native coronary artery 02/16/2013  . Chronic systolic heart failure (HCC) 08/16/2012  . Congestive dilated cardiomyopathy (HCC) 08/02/2012  . HTN (hypertension) 08/02/2012  . Familial tremor 11/29/2010  . VITAMIN D DEFICIENCY 04/04/2010  . Osteoarthrosis, unspecified whether generalized or localized, unspecified site 04/04/2010  . Hyperlipidemia with target LDL less than 70 01/19/2007  . Hypothyroidism 01/14/2007    Tereka Thorley L 05/23/2016, 5:02 PM [facility] [address] [place], [place], [address] Phone: [phone]   Fax:  [phone]  Name: Zyrielle Hazard MRN:  [id number] Date of Birth: 1934-01-21  Zerita Boers, PT,DPT 05/23/16 5:02 PM Phone: [phone] Fax: [phone]

## 2016-05-27 ENCOUNTER — Other Ambulatory Visit: Payer: Self-pay | Admitting: Podiatry

## 2016-05-27 DIAGNOSIS — E1151 Type 2 diabetes mellitus with diabetic peripheral angiopathy without gangrene: Secondary | ICD-10-CM

## 2016-05-27 DIAGNOSIS — R0989 Other specified symptoms and signs involving the circulatory and respiratory systems: Secondary | ICD-10-CM

## 2016-05-28 ENCOUNTER — Encounter: Payer: Self-pay | Admitting: Occupational Therapy

## 2016-05-28 ENCOUNTER — Ambulatory Visit: Payer: Medicare Other

## 2016-05-28 ENCOUNTER — Ambulatory Visit: Payer: Medicare Other | Admitting: Occupational Therapy

## 2016-05-28 ENCOUNTER — Ambulatory Visit: Payer: Medicare Other | Admitting: Physical Therapy

## 2016-05-28 ENCOUNTER — Encounter: Payer: Self-pay | Admitting: Physical Therapy

## 2016-05-28 DIAGNOSIS — R29818 Other symptoms and signs involving the nervous system: Secondary | ICD-10-CM | POA: Diagnosis not present

## 2016-05-28 DIAGNOSIS — I69954 Hemiplegia and hemiparesis following unspecified cerebrovascular disease affecting left non-dominant side: Secondary | ICD-10-CM | POA: Diagnosis not present

## 2016-05-28 DIAGNOSIS — R41842 Visuospatial deficit: Secondary | ICD-10-CM

## 2016-05-28 DIAGNOSIS — R2681 Unsteadiness on feet: Secondary | ICD-10-CM

## 2016-05-28 DIAGNOSIS — R41841 Cognitive communication deficit: Secondary | ICD-10-CM | POA: Diagnosis not present

## 2016-05-28 DIAGNOSIS — R41844 Frontal lobe and executive function deficit: Secondary | ICD-10-CM

## 2016-05-28 DIAGNOSIS — R2689 Other abnormalities of gait and mobility: Secondary | ICD-10-CM

## 2016-05-28 NOTE — Therapy (Signed)
[facility] [address] [place], [place], [address] Phone: [phone]   Fax:  [phone]  Physical Therapy Treatment  Patient Details  Name: [name] [name] MRN: [id number] Date of Birth: 1934-04-08 Referring Provider: Scarlette Calico, MD PCP (Dr. Leonie Man, neurologist)  Encounter Date: 05/28/2016      PT End of Session - 05/28/16 1503    Visit Number 14   Number of Visits 18   Date for PT Re-Evaluation 05/31/16   Authorization Type Medicare G-code, TRICARE   PT Start Time 1317   PT Stop Time 1401   PT Time Calculation (min) 44 min   Equipment Utilized During Treatment Gait belt   Activity Tolerance Patient tolerated treatment well   Behavior During Therapy [facility] for tasks assessed/performed      Past Medical History:  Diagnosis Date  . Chronic combined systolic and diastolic CHF, NYHA class 3 ([place])    a. 03/2013 Echo: EF 25-30%, diff HK, sev antsept HK, mild MR.  . Congestive dilated cardiomyopathy ([place])    a. 03/2013 Echo: EF 25-30%;  b. 09/2013 s/p SJM 3242 CRT-P.  Marland Kitchen Coronary artery disease    a. 08/2012 Cath: LM nl, LAD 22m LCX nondom, mild-mod nonobs dzs mid, OM1 ok, OM2 90/90p, RCA 40, EF 25-30%-->Med Rx.  . GERD (gastroesophageal reflux disease)   . High cholesterol   . Hypertension   . Hypothyroidism   . Left bundle branch block   . Osteoarthritis   . Stroke ([place]   . Type II diabetes mellitus ([place]     Past Surgical History:  Procedure Laterality Date  . ABDOMINAL HYSTERECTOMY  1980  . BI-VENTRICULAR PACEMAKER INSERTION N/A 10/01/2013   Procedure: BI-VENTRICULAR PACEMAKER INSERTION (CRT-P);  Surgeon: SDeboraha Sprang MD;  [facility];  Service: Cardiovascular;  Laterality: N/A;  . BI-VENTRICULAR PACEMAKER INSERTION (CRT-P)     a. 09/2013 s/p SJM 3242 CRT-P.  . Bilateral cateract surgery    . BMD  2006  . ORIF TIBIA PLATEAU Right 12/04/2013   Procedure: OPEN REDUCTION INTERNAL FIXATION (ORIF)  TIBIAL PLATEAU;  Surgeon: SAugustin Schooling MD;  Location[facility];  Service: Orthopedics;  Laterality: Right;  . RIGHT HEART CATHETERIZATION N/A 09/08/2012   Procedure: RIGHT HEART CATH;  Surgeon: DJolaine Artist MD;  [facility];  Service: Cardiovascular;  Laterality: N/A;    There were no vitals filed for this visit.      Subjective Assessment - 05/28/16 1409    Subjective Pt reports no issues or falls since last visit.  She states that the soreness in her back has decr and no longer bothers her when walking.   Patient is accompained by: --  Son, Ken   Limitations Standing;Walking;Lifting   Patient Stated Goals To walk and balance better. Build stamina.    Currently in Pain? No/denies   Pain Onset In the past 7 days   Multiple Pain Sites No            [facility] PT Assessment - 05/28/16 0001      Standardized Balance Assessment   Standardized Balance Assessment Berg Balance Test;Timed Up and Go Test     Berg Balance Test   Sit to Stand Able to stand without using hands and stabilize independently   Standing Unsupported Able to stand safely 2 minutes   Sitting with Back Unsupported but Feet Supported on Floor or Stool Able to sit safely and securely 2 minutes   Stand to Sit  Sits safely with minimal use of hands   Transfers Able to transfer with verbal cueing and /or supervision   Standing Unsupported with Eyes Closed Able to stand 10 seconds with supervision   Standing Ubsupported with Feet Together Able to place feet together independently and stand for 1 minute with supervision   From Standing, Reach Forward with Outstretched Arm Can reach confidently >25 cm (10")   From Standing Position, Pick up Object from [place] to pick up shoe, needs supervision   From Standing Position, Turn to Look Behind Over each Shoulder Looks behind from both sides and weight shifts well   Turn 360 Degrees Able to turn 360 degrees safely but slowly   Standing Unsupported, Alternately  Place Feet on Step/Stool Able to complete >2 steps/needs minimal assist   Standing Unsupported, One Foot in Front Able to take small step independently and hold 30 seconds   Standing on One Leg Tries to lift leg/unable to hold 3 seconds but remains standing independently   Total Score 41   Berg comment: Indicates significant fall risk.     Timed Up and Go Test   Normal TUG (seconds) 24.66  with RW   Cognitive TUG (seconds) 53.69  with RW and while naming grandchildren                     [facility] Adult PT Treatment/Exercise - 05/28/16 1315      Transfers   Transfers Sit to Stand;Stand to [employer] Transfers   Sit to Stand 5: Supervision;Without upper extremity assist;From chair/3-in-1   Sit to Stand Details (indicate cue type and reason) Verbal cuing to scoot to the edge of the chair befor standing.   Stand to Sit 5: Supervision;Without upper extremity assist;To chair/3-in-1   Stand Pivot Transfers 5: Supervision   Stand Pivot Transfer Details (indicate cue type and reason) Verbal cuing to fully turn around and back up to the chair before beginning to sit and to avoid falling.     Ambulation/Gait   Ambulation/Gait Yes   Ambulation/Gait Assistance 5: Supervision   Ambulation/Gait Assistance Details Pt ambulated while negotiatiing around obstacles and through tight spaces with verbal cuing to avoid chairs, tables, etc and to walk inside RW in order to avoid incr trunk flex.   Ambulation Distance (Feet) 500 Feet  1x500', 1x100', 2x20'   Assistive device Rolling walker   Gait Pattern Step-through pattern;Decreased stride length;Decreased hip/knee flexion - right;Decreased hip/knee flexion - left;Shuffle;Trunk flexed;Narrow base of support;Poor foot clearance - left;Poor foot clearance - right   Ambulation Surface Level;Indoor  Tile and carpet                PT Education - 05/28/16 1502    Education provided Yes   Education Details Pt instructed to walk inside  the RW to prevent incr trunk flex and to fully turn around before sitting in a chair.   Person(s) Educated Patient   Methods Explanation;Verbal cues   Comprehension Verbalized understanding;Returned demonstration          PT Short Term Goals - 05/08/16 1413      PT SHORT TERM GOAL #1   Title Patient with family cues demonstrates understanding of [place] HEP. (Target Date: 05/08/2016)   Baseline GOAL MET: 05/08/16   Time 1   Period Months   Status Achieved     PT SHORT TERM GOAL #2   Title Timed Up-Go <15sec with supervision. (Target Date: 05/08/2016)   Baseline TUG: 18.87sec; 05/08/16  Time 1   Period Months   Status On-going     PT SHORT TERM GOAL #3   Title Standing balance turning head to look to side and reaches 5" & to floor with supervision. (Target Date: 05/08/2016)   Baseline GOAL MET: 05/08/16   Time 1   Period Months   Status Achieved     PT SHORT TERM GOAL #4   Title Patient ambulates 250' around obstacles with LRAD with supervision. (Target Date: 05/08/2016)   Baseline GOAL MET: 05/08/16   Time 1   Period Months   Status Achieved     PT SHORT TERM GOAL #5   Title Cognitive TUG <25sec with simple naming task.  (Target Date: 05/08/2016)   Baseline Cog TUG: 45.34sec; 05/08/16   Time 1   Period Months   Status On-going           PT Long Term Goals - 05/28/16 1520      PT LONG TERM GOAL #1   Title Patient and family demonstrate & verbalize understanding of ongoing fitness plan & HEP. (Target Date: 05/31/2016)   Time 2   Period Months   Status On-going     PT LONG TERM GOAL #2   Title Timed Up-Go standard <13.5sec safely without device. (Target Date: 05/31/2016)   Baseline 05/28/16 - Normal TUG: 24.66 with RW; Cog TUG: 53.69 with RW and naming grandchildren   Time 2   Period Months   Status Not Met     PT LONG TERM GOAL #3   Title Berg Balance >36/56 to indicate lower fall risk. (Target Date: 05/31/2016)   Baseline GOAL MET: 05/28/16 - 41/56   Time  2   Period Months   Status Achieved     PT LONG TERM GOAL #4   Title Dynamic Gait Index >/= 10/24 to indicate lower fall risk. (Target Date: 05/31/2016)   Time 2   Period Months   Status On-going     PT LONG TERM GOAL #5   Title Patient ambulates 500' with LRAD with supervision for cognitive deficits no balance losses safely. (Target Date: 05/31/2016)   Baseline GOAL MET: 05/28/16   Time 2   Period Months   Status Achieved     PT LONG TERM GOAL #6   Title Patient negotiates ramps, curbs & stairs (1 rail) with LRAD with supervision from family safely. (Target Date: 05/31/2016)   Time 2   Period Months   Status On-going     PT LONG TERM GOAL #7   Title Patient ambulates around furniture with LRAD modified independent for household mobility. (Target Date: 05/31/2016)   Baseline GOAL PARTIALLY MET: 05/28/16 - Pt required supervision and RW while ambulating   Time 2   Period Months   Status Partially Met               Plan - 05/28/16 1503    Clinical Impression Statement Pt continues to demonstrate improvement with balance and gait activities as evidence by her ability to meet several LTGs today.  She was able to demonstrate improvement in static balance by scoring a 41/56 on the Berg Balance test (previoulsy 20/56 on 04/08/16).  Her normal TUG time was recorded at 24.66 sec and her cognitive TUG was 53.69 which is indicative of a high fall risk.  Pt used a RW while performing the TUG.  During initial testing of the TUG on 04/08/16, pt did not use AD however, demonstated significant gait deviations and balance deficits.  The addition of the RW incr the pt's safety when ambulating although it does slow her TUG time.  The remaining LTG will be assessed next session in which pt should be on track for d/c.   Rehab Potential Good   PT Frequency 2x / week   PT Duration Other (comment)   PT Treatment/Interventions ADLs/Self Care Home Management;DME Instruction;Gait training;Stair  training;Functional mobility training;Therapeutic activities;Therapeutic exercise;Balance training;Neuromuscular re-education;Patient/family education;Orthotic Fit/Training;Vestibular   PT Next Visit Plan Assess remaining LTGs and either d/c or renew based on progress.    Consulted and Agree with Plan of Care Patient;Family member/caregiver   Family Member Consulted son      Patient will benefit from skilled therapeutic intervention in order to improve the following deficits and impairments:  Abnormal gait, Decreased activity tolerance, Decreased balance, Decreased coordination, Decreased endurance, Decreased mobility, Decreased safety awareness, Decreased strength, Impaired vision/preception, Postural dysfunction  Visit Diagnosis: Hemiplegia and hemiparesis following unspecified cerebrovascular disease affecting left non-dominant side (HCC)  Other abnormalities of gait and mobility  Unsteadiness on feet     Problem List Patient Active Problem List   Diagnosis Date Noted  . Diabetic neuropathy, painful ([place]) 04/24/2016  . Left carotid artery stenosis   . Hypertriglyceridemia, essential 01/10/2016  . 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 Type 2 diabetes mellitus with diabetic neuropathy, with long-term current use of insulin ([place]) 10/06/2015  . Unspecified deficiency anemia 03/10/2014  . Osteoporosis, unspecified 03/10/2014  . Left bundle branch block 07/01/2013  . Coronary atherosclerosis of native coronary artery 02/16/2013  . Chronic systolic heart failure ([place]) 08/16/2012  . Congestive dilated cardiomyopathy ([place]) 08/02/2012  . HTN (hypertension) 08/02/2012  . Familial tremor 11/29/2010  . VITAMIN D DEFICIENCY 04/04/2010  . Osteoarthrosis, unspecified whether generalized or localized, unspecified site 04/04/2010  . Hyperlipidemia with target LDL less than 70 01/19/2007  . Hypothyroidism 01/14/2007    Katherine Mantle, SPT 05/28/2016, 3:25 PM  [facility] [address] [place], [place], [address] Phone: [phone]   Fax:  [phone]  Name: Lizzett Nobile MRN: [id number] Date of Birth: 21-Oct-1933

## 2016-05-28 NOTE — Therapy (Signed)
[facility] [address] [place], [place], [address] Phone: [phone]   Fax:  [phone]  Occupational Therapy Treatment  Patient Details  Name: [name] [name] MRN: [id number] Date of Birth: June 09, 1934 Referring Provider: Dr. Scarlette Calico  Encounter Date: 05/28/2016      OT End of Session - 05/28/16 1531    Visit Number 11   Number of Visits 16   Date for OT Re-Evaluation 06/03/16   Authorization Type medicare- will need g code and progress note every 10th visit   Authorization Time Period 60 days   OT Start Time 1447   OT Stop Time 1529   OT Time Calculation (min) 42 min   Activity Tolerance Patient tolerated treatment well      Past Medical History:  Diagnosis Date  . Chronic combined systolic and diastolic CHF, NYHA class 3 ([place])    a. 03/2013 Echo: EF 25-30%, diff HK, sev antsept HK, mild MR.  . Congestive dilated cardiomyopathy ([place])    a. 03/2013 Echo: EF 25-30%;  b. 09/2013 s/p SJM 3242 CRT-P.  Marland Kitchen Coronary artery disease    a. 08/2012 Cath: LM nl, LAD 4m LCX nondom, mild-mod nonobs dzs mid, OM1 ok, OM2 90/90p, RCA 40, EF 25-30%-->Med Rx.  . GERD (gastroesophageal reflux disease)   . High cholesterol   . Hypertension   . Hypothyroidism   . Left bundle branch block   . Osteoarthritis   . [place]roke ([place]   . Type II diabetes mellitus ([place]     Past Surgical History:  Procedure Laterality Date  . ABDOMINAL HYSTERECTOMY  1980  . BI-VENTRICULAR PACEMAKER INSERTION N/A 10/01/2013   Procedure: BI-VENTRICULAR PACEMAKER INSERTION (CRT-P);  Surgeon: SDeboraha Sprang MD;  [facility];  Service: Cardiovascular;  Laterality: N/A;  . BI-VENTRICULAR PACEMAKER INSERTION (CRT-P)     a. 09/2013 s/p SJM 3242 CRT-P.  . Bilateral cateract surgery    . BMD  2006  . ORIF TIBIA PLATEAU Right 12/04/2013   Procedure: OPEN REDUCTION INTERNAL FIXATION (ORIF) TIBIAL PLATEAU;  Surgeon: SAugustin Schooling MD;  Location: [facility]  ORS;  Service: Orthopedics;  Laterality: Right;  . RIGHT HEART CATHETERIZATION N/A 09/08/2012   Procedure: RIGHT HEART CATH;  Surgeon: DJolaine Artist MD;  [facility];  Service: Cardiovascular;  Laterality: N/A;    There were no vitals filed for this visit.      Subjective Assessment - 05/28/16 1447    Subjective  I will be happy to graduate from therapy   Pertinent History see epic snapshot, CVA in 12/2015, diabetic neuropathy   Patient Stated Goals I want better energy   Currently in Pain? No/denies                      OT Treatments/Exercises (OP) - 05/28/16 1519      ADLs   Cooking Addressed simple familiar hot meal prep (grilled cheese) at ambulatory level with RW.  Pt able to complete with supervision for balance and max cues for safety. Pt required mod vc's for scanning to left and finding items in an unfamiliar environment.  Pt able to tolerate 20 minutes of ambulatory activity before needing rest break.     Fine Motor Coordination   In Hand Manipulation Training Therpeutic activities to address fine motor control, strength in L hand, attention to L hand, and attending to left visual field.  Reinforced need for patient to continue to do HEP even after discharge  from OT.                   OT Short Term Goals - 05/28/16 1522      OT SHORT TERM GOAL #1   Title Pt and family will be mod I with home activities program - 05/06/2016   Status Achieved  05/16/2016     OT SHORT TERM GOAL #2   Title Pt will need  min vc's for table top scanning activities with functional task    Status Achieved     OT SHORT TERM GOAL #3   Title Pt will be supervision for simple hot meal prep making familiar hot meal   Status Achieved  pt with 1 significant LOB with cooking and clean up activity     OT SHORT TERM GOAL #4   Title Pt will increase grip strength by at least 3 pounds to assist in functional tasks (baseline = 30 pounds)   Status Achieved     OT  SHORT TERM GOAL #5   Title Pt will be mod I with bathing at shower level   Status Achieved  05/21/2016 per pt report           OT Long Term Goals - 05/28/16 1522      OT LONG TERM GOAL #1   Title Pt and family will be mod I with home activities program - 06/03/2016   Status Achieved     OT LONG TERM GOAL #2   Title Pt will be min vc's for environmental scanning with familiar functional tasks   Status Partially Met  min vc's in familiar environment, mod - max cues in unfamiliar environment     OT LONG TERM GOAL #3   Title Pt will increase grip strength in LUE by 5 pounds to assist with functioal activities (baseline= 30)   Status Achieved  37 pounds     OT LONG TERM GOAL #4   Title Pt will tolerate 15 minutes of ambulatory activity with no more than 1 rest break   Status Achieved  Pt states "I don't want to work on that goal - 25 minutes is too long."  Goal revised to 15 minutes and pt in agreement     OT LONG TERM GOAL #5   Title Pt will be mod S with shower transfers   Status Achieved  pt reports that her son stands in next to her when she gets into shower but doesn't actually touch her.               Plan - 05/28/16 1523    Clinical Impression Statement Pt has met or partially met all STG and LTG's. Pt is ready for discharge from OT   Rehab Potential Good   Clinical Impairments Affecting Rehab Potential L neglect, significant cognitive deficits   OT Frequency 2x / week   OT Duration 8 weeks   Plan d/c from OT today   Consulted and Agree with Plan of Care Patient;Family member/caregiver   Family Member Consulted son      Patient will benefit from skilled therapeutic intervention in order to improve the following deficits and impairments:  Cardiopulmonary status limiting activity, Decreased activity tolerance, Decreased balance, Decreased cognition, Decreased knowledge of use of DME, Decreased mobility, Decreased safety awareness, Difficulty walking, Decreased  strength, Impaired UE functional use, Impaired sensation, Impaired vision/preception  Visit Diagnosis: Unsteadiness on feet  Hemiplegia and hemiparesis following unspecified cerebrovascular disease affecting left non-dominant side (HCC)  Other symptoms  and signs involving the nervous system  Visuospatial deficit  Frontal lobe and executive function deficit    Problem List Patient Active Problem List   Diagnosis Date Noted  . Diabetic neuropathy, painful ([place]) 04/24/2016  . Left carotid artery stenosis   . Hypertriglyceridemia, essential 01/10/2016  . 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 Type 2 diabetes mellitus with diabetic neuropathy, with long-term current use of insulin ([place]) 10/06/2015  . Unspecified deficiency anemia 03/10/2014  . Osteoporosis, unspecified 03/10/2014  . Left bundle branch block 07/01/2013  . Coronary atherosclerosis of native coronary artery 02/16/2013  . Chronic systolic heart failure ([place]) 08/16/2012  . Congestive dilated cardiomyopathy ([place]) 08/02/2012  . HTN (hypertension) 08/02/2012  . Familial tremor 11/29/2010  . VITAMIN D DEFICIENCY 04/04/2010  . Osteoarthrosis, unspecified whether generalized or localized, unspecified site 04/04/2010  . Hyperlipidemia with target LDL less than 70 01/19/2007  . Hypothyroidism 01/14/2007   OCCUPATIONAL THERAPY DISCHARGE SUMMARY  Visits from Start of Care: 11  Current functional level related to goals / functional outcomes: See above   Remaining deficits: See above   Education / Equipment: See above Plan: Patient agrees to discharge.  Patient goals were met. Patient is being discharged due to meeting the stated rehab goals.  ?????     Quay Burow, OTR/L 05/28/2016, 4:39 PM  [facility] [address] [place], [place], [address] Phone: [phone]   Fax:   [phone]  Name: [name] [name] MRN: [id number] Date of Birth: [birth year]

## 2016-05-29 ENCOUNTER — Encounter: Payer: Self-pay | Admitting: Internal Medicine

## 2016-05-29 ENCOUNTER — Ambulatory Visit (INDEPENDENT_AMBULATORY_CARE_PROVIDER_SITE_OTHER): Payer: Medicare Other | Admitting: Internal Medicine

## 2016-05-29 ENCOUNTER — Ambulatory Visit (INDEPENDENT_AMBULATORY_CARE_PROVIDER_SITE_OTHER)
Admission: RE | Admit: 2016-05-29 | Discharge: 2016-05-29 | Disposition: A | Discharge status: Home / Self Care | Payer: Medicare Other | Source: Ambulatory Visit | Attending: Internal Medicine | Admitting: Internal Medicine

## 2016-05-29 ENCOUNTER — Other Ambulatory Visit (INDEPENDENT_AMBULATORY_CARE_PROVIDER_SITE_OTHER): Payer: Medicare Other

## 2016-05-29 VITALS — BP 120/64 | HR 69 | Temp 97.9°F | Resp 16 | Ht 65.0 in | Wt 128.2 lb

## 2016-05-29 DIAGNOSIS — E039 Hypothyroidism, unspecified: Secondary | ICD-10-CM | POA: Diagnosis not present

## 2016-05-29 DIAGNOSIS — M7989 Other specified soft tissue disorders: Secondary | ICD-10-CM

## 2016-05-29 DIAGNOSIS — I10 Essential (primary) hypertension: Secondary | ICD-10-CM | POA: Diagnosis not present

## 2016-05-29 DIAGNOSIS — M79675 Pain in left toe(s): Secondary | ICD-10-CM | POA: Diagnosis not present

## 2016-05-29 DIAGNOSIS — L03032 Cellulitis of left toe: Secondary | ICD-10-CM | POA: Insufficient documentation

## 2016-05-29 DIAGNOSIS — Z23 Encounter for immunization: Secondary | ICD-10-CM | POA: Diagnosis not present

## 2016-05-29 DIAGNOSIS — E785 Hyperlipidemia, unspecified: Secondary | ICD-10-CM

## 2016-05-29 DIAGNOSIS — E1140 Type 2 diabetes mellitus with diabetic neuropathy, unspecified: Secondary | ICD-10-CM

## 2016-05-29 DIAGNOSIS — I639 Cerebral infarction, unspecified: Secondary | ICD-10-CM | POA: Diagnosis not present

## 2016-05-29 DIAGNOSIS — Z794 Long term (current) use of insulin: Secondary | ICD-10-CM

## 2016-05-29 LAB — BASIC METABOLIC PANEL
BUN: 16 mg/dL (ref 6–23)
CHLORIDE: 105 meq/L (ref 96–112)
CO2: 28 meq/L (ref 19–32)
Calcium: 9.8 mg/dL (ref 8.4–10.5)
Creatinine, Ser: 1.12 mg/dL (ref 0.40–1.20)
GFR: 60.19 mL/min (ref >60.00)
GLUCOSE: 153 mg/dL — AB (ref 70–99)
POTASSIUM: 4.2 meq/L (ref 3.5–5.1)
SODIUM: 141 meq/L (ref 135–145)

## 2016-05-29 LAB — TSH: TSH: 0.23 u[IU]/mL — ABNORMAL LOW (ref 0.35–4.50)

## 2016-05-29 LAB — HEMOGLOBIN A1C: Hgb A1c MFr Bld: 9.2 % — ABNORMAL HIGH (ref 4.6–6.5)

## 2016-05-29 MED ORDER — AMOXICILLIN-POT CLAVULANATE 875-125 MG PO TABS: 1.0000 | ORAL_TABLET | Freq: Two times a day (BID) | ORAL | 1 refills | Status: AC

## 2016-05-29 MED ORDER — LEVOTHYROXINE SODIUM 112 MCG PO TABS: 112.0000 ug | ORAL_TABLET | Freq: Every day | ORAL | 3 refills | Status: DC

## 2016-05-29 NOTE — Progress Notes (Signed)
Pre visit review using our clinic review tool, if applicable. No additional management support is needed unless otherwise documented below in the visit note. 

## 2016-05-29 NOTE — Patient Instructions (Signed)

## 2016-05-29 NOTE — Progress Notes (Signed)
Subjective:  Patient ID: [name] [name], female    DOB: 02/02/1934  Age: 82 y.o. MRN: [id number]  CC: Diabetes and Hypothyroidism   HPI Briasha Schwaiger presents for follow-up. She is concerned about her left second toe for the last 2 weeks. She tells me she recently saw a podiatrist who did something to a callus at the tip of the toe and now over the last 2 weeks it has been sore and has turned dark. She has not seen any drainage from the callus over the tip of the toe. She is otherwise stable after her recent cerebrovascular accident. She has family with her today who say that she is making improvements in her ability to take care of herself.  Outpatient Medications Prior to Visit  Medication Sig Dispense Refill  . aspirin 325 MG tablet Take 1 tablet (325 mg total) by mouth daily. 30 tablet 1  . aspirin EC 81 MG tablet Take 81 mg by mouth daily.    . carvedilol (COREG) 12.5 MG tablet TAKE 1 TABLET TWICE A DAY WITH MEALS 180 tablet 5  . carvedilol (COREG) 6.25 MG tablet Take 6.25 mg by mouth 2 (two) times daily with a meal.    . clopidogrel (PLAVIX) 75 MG tablet Take 1 tablet (75 mg total) by mouth daily. 90 tablet 2  . co-enzyme Q-10 30 MG capsule Take 1 capsule (30 mg total) by mouth daily. 30 capsule 2  . furosemide (LASIX) 20 MG tablet Take 20 mg by mouth daily as needed for fluid.     Marland Kitchen glipiZIDE (GLUCOTROL) 10 MG tablet TAKE 1 TABLET TWICE A DAY BEFORE MEALS 180 tablet 1  . HYDROcodone-acetaminophen (NORCO/VICODIN) 5-325 MG tablet Take 1 tablet by mouth every 6 (six) hours as needed for moderate pain. 75 tablet 0  . hydrOXYzine (ATARAX/VISTARIL) 25 MG tablet Take 1 tablet (25 mg total) by mouth 3 (three) times daily as needed. 60 tablet 3  . Insulin Glargine (LANTUS SOLOSTAR) 100 UNIT/ML Solostar Pen Inject 18 Units into the skin every morning. 15 mL 10  . LANTUS SOLOSTAR 100 UNIT/ML Solostar Pen INJECT 14 UNITS SUBCUTANEOUSLY BEFORE BREAKFAST 15 mL 1  . lisinopril (PRINIVIL,ZESTRIL) 5 MG  tablet Take 1 tablet (5 mg total) by mouth 2 (two) times daily. 180 tablet 3  . Omega-3 Fatty Acids (FISH OIL PO) Take by mouth.    Marland Kitchen omeprazole (PRILOSEC) 20 MG capsule Take 20 mg by mouth daily as needed (for acid reflux/heartburn.).     Marland Kitchen rosuvastatin (CRESTOR) 20 MG tablet Take 1 tablet (20 mg total) by mouth daily. 90 tablet 2  . Vitamin D, Ergocalciferol, (DRISDOL) 50000 units CAPS capsule TAKE 1 CAPSULE EVERY 7 DAYS ON MONDAYS 12 capsule 0  . levothyroxine (SYNTHROID, LEVOTHROID) 137 MCG tablet Take 1 tablet (137 mcg total) by mouth daily before breakfast. 90 tablet 1   No facility-administered medications prior to visit.     ROS Review of Systems  Constitutional: Negative for activity change, appetite change, chills, fatigue and fever.  HENT: Negative.   Eyes: Negative.  Negative for visual disturbance.  Respiratory: Negative.  Negative for cough, choking, chest tightness, shortness of breath and stridor.   Cardiovascular: Negative.  Negative for chest pain, palpitations and leg swelling.  Gastrointestinal: Negative.  Negative for abdominal pain, constipation, diarrhea, nausea and vomiting.  Endocrine: Negative.   Genitourinary: Negative.  Negative for difficulty urinating.  Musculoskeletal: Negative.  Negative for arthralgias, back pain, joint swelling, myalgias and neck pain.  Skin: Positive  for color change and wound. Negative for pallor and rash.  Allergic/Immunologic: Negative.   Neurological: Negative.  Negative for dizziness, weakness and light-headedness.  Hematological: Negative.  Negative for adenopathy. Does not bruise/bleed easily.  Psychiatric/Behavioral: Negative.     Objective:  BP 120/64 (BP Location: Left Arm, Patient Position: Sitting, Cuff Size: Normal)   Pulse 69   Temp 97.9 F (36.6 C) (Oral)   Resp 16   Ht 5\' 5"  (1.651 m)   Wt 128 lb 3 oz (58.1 kg)   SpO2 96%   BMI 21.33 kg/m   BP Readings from Last 3 Encounters:  05/29/16 120/64  05/16/16  105/61  04/11/16 104/68    Wt Readings from Last 3 Encounters:  05/29/16 128 lb 3 oz (58.1 kg)  04/05/16 133 lb (60.3 kg)  03/29/16 124 lb (56.2 kg)    Physical Exam  Constitutional: She is oriented to person, place, and time. No distress.  HENT:  Mouth/Throat: Oropharynx is clear and moist. No oropharyngeal exudate.  Eyes: Conjunctivae are normal. Right eye exhibits no discharge. Left eye exhibits no discharge. No scleral icterus.  Neck: Normal range of motion. Neck supple. No JVD present. No tracheal deviation present. No thyromegaly present.  Cardiovascular: Normal rate, regular rhythm, normal heart sounds and intact distal pulses.  Exam reveals no gallop and no friction rub.   No murmur heard. Pulmonary/Chest: Effort normal and breath sounds normal. No stridor. No respiratory distress. She has no wheezes. She has no rales. She exhibits no tenderness.  Abdominal: Soft. Bowel sounds are normal. She exhibits no distension and no mass. There is no tenderness. There is no rebound and no guarding.  Musculoskeletal: Normal range of motion. She exhibits no edema, tenderness or deformity.       Feet:  Lymphadenopathy:    She has no cervical adenopathy.  Neurological: She is oriented to person, place, and time.  Skin: Skin is warm and dry. No rash noted. She is not diaphoretic. No erythema. No pallor.  Vitals reviewed.   Lab Results  Component Value Date   WBC 7.4 04/05/2016   HGB 12.5 04/05/2016   HCT 39.0 04/05/2016   PLT 250 04/05/2016   GLUCOSE 153 (H) 05/29/2016   CHOL 218 (H) 01/11/2016   TRIG 178 (H) 01/11/2016   HDL 30 (L) 01/11/2016   LDLDIRECT 141.0 01/10/2016   LDLCALC 152 (H) 01/11/2016   ALT 14 01/10/2016   AST 22 01/10/2016   NA 141 05/29/2016   K 4.2 05/29/2016   CL 105 05/29/2016   CREATININE 1.12 05/29/2016   BUN 16 05/29/2016   CO2 28 05/29/2016   TSH 0.23 (L) 05/29/2016   INR 1.13 04/05/2016   HGBA1C 9.2 (H) 05/29/2016   MICROALBUR 3.6 (H) 01/10/2016      No results found.  Assessment & Plan:   Bernetta was seen today for diabetes and hypothyroidism.  Diagnoses and all orders for this visit:  Hypothyroidism, unspecified type- her TSH is suppressed so I have lowered her dose of levothyroxine -     TSH; Future -     levothyroxine (SYNTHROID, LEVOTHROID) 112 MCG tablet; Take 1 tablet (112 mcg total) by mouth daily.  Type 2 diabetes mellitus with diabetic neuropathy, with long-term current use of insulin (HCC)- her A1c is down to 9.8%, this is an improvement for her, she and her family tell me that this is the best that they can achieve. -     Basic metabolic panel; Future -  Hemoglobin A1c; Future  Essential hypertension- her blood sugars adequately well-controlled, electrolytes and renal function are stable. -     Basic metabolic panel; Future  Hyperlipidemia with target LDL less than 70  Pain and swelling of toe of left foot - exam and plain films raise concern that the second toe may have some degree of cellulitis or early osteomyelitis related to diabetes with an callus/ulcer, will start treating for polymicrobial infection with Augmentin. -     DG Foot Complete Left; Future  Need for prophylactic vaccination and inoculation against influenza -     Flu vaccine HIGH DOSE PF (Fluzone High dose)   I have discontinued Ms. Gertz's levothyroxine. I am also having her start on levothyroxine and amoxicillin-clavulanate. Additionally, I am having her maintain her carvedilol, furosemide, omeprazole, aspirin, Insulin Glargine, co-enzyme Q-10, lisinopril, Vitamin D (Ergocalciferol), glipiZIDE, Omega-3 Fatty Acids (FISH OIL PO), aspirin EC, carvedilol, rosuvastatin, clopidogrel, hydrOXYzine, HYDROcodone-acetaminophen, and LANTUS SOLOSTAR.  Meds ordered this encounter  Medications  . levothyroxine (SYNTHROID, LEVOTHROID) 112 MCG tablet    Sig: Take 1 tablet (112 mcg total) by mouth daily.    Dispense:  90 tablet    Refill:  3  .  amoxicillin-clavulanate (AUGMENTIN) 875-125 MG tablet    Sig: Take 1 tablet by mouth 2 (two) times daily.    Dispense:  28 tablet    Refill:  1     Follow-up: Return in about 4 months (around 09/29/2016).  Sanda Linger, MD

## 2016-05-30 ENCOUNTER — Ambulatory Visit: Payer: Medicare Other | Admitting: Physical Therapy

## 2016-05-30 ENCOUNTER — Encounter: Payer: Medicare Other | Admitting: Occupational Therapy

## 2016-05-30 ENCOUNTER — Encounter: Payer: Self-pay | Admitting: Physical Therapy

## 2016-05-30 ENCOUNTER — Other Ambulatory Visit: Payer: Self-pay | Admitting: Neurology

## 2016-05-30 DIAGNOSIS — M6281 Muscle weakness (generalized): Secondary | ICD-10-CM

## 2016-05-30 DIAGNOSIS — R2689 Other abnormalities of gait and mobility: Secondary | ICD-10-CM

## 2016-05-30 DIAGNOSIS — R2681 Unsteadiness on feet: Secondary | ICD-10-CM

## 2016-05-30 DIAGNOSIS — I69954 Hemiplegia and hemiparesis following unspecified cerebrovascular disease affecting left non-dominant side: Secondary | ICD-10-CM | POA: Diagnosis not present

## 2016-05-30 DIAGNOSIS — R41841 Cognitive communication deficit: Secondary | ICD-10-CM | POA: Diagnosis not present

## 2016-05-30 DIAGNOSIS — R41844 Frontal lobe and executive function deficit: Secondary | ICD-10-CM | POA: Diagnosis not present

## 2016-05-30 DIAGNOSIS — R29818 Other symptoms and signs involving the nervous system: Secondary | ICD-10-CM | POA: Diagnosis not present

## 2016-05-30 DIAGNOSIS — I63331 Cerebral infarction due to thrombosis of right posterior cerebral artery: Secondary | ICD-10-CM

## 2016-05-30 DIAGNOSIS — R41842 Visuospatial deficit: Secondary | ICD-10-CM | POA: Diagnosis not present

## 2016-05-30 NOTE — Therapy (Signed)
[facility] [address] [place], [place], [address] Phone: [phone]   Fax:  [phone]  Physical Therapy Treatment  Patient Details  Name: [name] [name] MRN: [id number] Date of Birth: Sep 26, 1933 Referring Provider: Scarlette Calico, MD PCP (Dr. Leonie Man, neurologist)  Encounter Date: 05/30/2016      PT End of Session - 05/30/16 1554    Visit Number 15   Number of Visits 18   Date for PT Re-Evaluation 05/31/16   Authorization Type Medicare G-code, TRICARE   PT Start Time 1400   PT Stop Time 1445   PT Time Calculation (min) 45 min   Equipment Utilized During Treatment Gait belt   Activity Tolerance Patient tolerated treatment well   Behavior During Therapy [facility] for tasks assessed/performed      Past Medical History:  Diagnosis Date  . Chronic combined systolic and diastolic CHF, NYHA class 3 ([place])    a. 03/2013 Echo: EF 25-30%, diff HK, sev antsept HK, mild MR.  . Congestive dilated cardiomyopathy ([place])    a. 03/2013 Echo: EF 25-30%;  b. 09/2013 s/p SJM 3242 CRT-P.  Marland Kitchen Coronary artery disease    a. 08/2012 Cath: LM nl, LAD 27m LCX nondom, mild-mod nonobs dzs mid, OM1 ok, OM2 90/90p, RCA 40, EF 25-30%-->Med Rx.  . GERD (gastroesophageal reflux disease)   . High cholesterol   . Hypertension   . Hypothyroidism   . Left bundle branch block   . Osteoarthritis   . [place]   . Type II diabetes mellitus ([place]     Past Surgical History:  Procedure Laterality Date  . ABDOMINAL HYSTERECTOMY  1980  . BI-VENTRICULAR PACEMAKER INSERTION N/A 10/01/2013   Procedure: BI-VENTRICULAR PACEMAKER INSERTION (CRT-P);  Surgeon: SDeboraha Sprang MD;  [facility];  Service: Cardiovascular;  Laterality: N/A;  . BI-VENTRICULAR PACEMAKER INSERTION (CRT-P)     a. 09/2013 s/p SJM 3242 CRT-P.  . Bilateral cateract surgery    . BMD  2006  . ORIF TIBIA PLATEAU Right 12/04/2013   Procedure: OPEN REDUCTION INTERNAL FIXATION (ORIF)  TIBIAL PLATEAU;  Surgeon: SAugustin Schooling MD;  [facility];  Service: Orthopedics;  Laterality: Right;  . RIGHT HEART CATHETERIZATION N/A 09/08/2012   Procedure: RIGHT HEART CATH;  Surgeon: DJolaine Artist MD;  [facility];  Service: Cardiovascular;  Laterality: N/A;    There were no vitals filed for this visit.      Subjective Assessment - 05/30/16 1406    Subjective Pt reports no issues or falls since last visit. She states "things have been going really well".   Patient is accompained by: --  Son, Ken   Limitations Standing;Walking;Lifting   Patient Stated Goals To walk and balance better. Build stamina.    Currently in Pain? No/denies   Pain Onset In the past 7 days   Multiple Pain Sites No            [facility] PT Assessment - 05/30/16 1400      Dynamic Gait Index   Level Surface Mild Impairment   Change in Gait Speed Mild Impairment   Gait with Horizontal Head Turns Mild Impairment   Gait with Vertical Head Turns Mild Impairment   Gait and Pivot Turn Moderate Impairment   Step Over Obstacle Moderate Impairment   Step Around Obstacles Mild Impairment   Steps Moderate Impairment   Total Score 13  [facility] Adult PT Treatment/Exercise - 05/30/16 1400      Transfers   Transfers Sit to Stand;Stand to [employer] Transfers   Sit to Stand 6: Modified independent (Device/Increase time);With upper extremity assist;With armrests;From chair/3-in-1   Stand to Sit 5: Supervision;With upper extremity assist;With armrests;To chair/3-in-1   Stand Pivot Transfers 5: Supervision;With armrests   Stand Pivot Transfer Details (indicate cue type and reason) Verbal cuing to fully turn around and back up to the chair before sitting.     Ambulation/Gait   Ambulation/Gait Yes   Ambulation/Gait Assistance 5: Supervision   Ambulation/Gait Assistance Details Verbal cuing to avoid obstacles in path, especially to the Lt and to stay inside RW to  prevent incr trunk flex.   Ambulation Distance (Feet) 120 Feet  1x120', 1x100', 8x40'   Assistive device Rolling walker   Gait Pattern Step-through pattern;Decreased stride length;Decreased hip/knee flexion - right;Decreased hip/knee flexion - left;Trunk flexed;Narrow base of support;Poor foot clearance - left;Poor foot clearance - right   Ambulation Surface Level;Indoor   Stairs Yes   Stairs Assistance 5: Supervision   Stairs Assistance Details (indicate cue type and reason) Verbal cuing for sequencing and to fully flex bilat hip/knee to clear step.   Stair Management Technique One rail Left;Alternating pattern   Number of Stairs 4   Height of Stairs 6   Ramp 5: Supervision   Ramp Details (indicate cue type and reason) Verbal cues to keep RW in the middle of the ramp to avoid wheel going over the edge and to move hands back on handles when going down ramp to assist in deceleration.   Curb 5: Supervision   Curb Details (indicate cue type and reason) Verbal cues on sequencing to ensure pt did not over reach when placing RW on floor/up on curb.  Pt cued to step closer to the edge of curb before stepping up/down.             Balance Exercises - 05/30/16 1552      OTAGO PROGRAM   Head Movements Sitting;5 reps   Neck Movements Sitting;5 reps   Back Extension Standing;5 reps   Trunk Movements Standing;5 reps   Ankle Movements Sitting;10 reps   Knee Extensor 10 reps   Knee Flexor 10 reps   Hip ABductor 10 reps   Ankle Plantorflexors 20 reps, support  10 reps   Ankle Dorsiflexors 20 reps, support  10 reps   Knee Bends 10 reps, support           PT Education - 05/30/16 1553    Education provided Yes   Education Details Pt instructed to continue [place] program for on-going fitness plan and to use assistance from sons when needed.   Person(s) Educated Patient   Methods Explanation;Demonstration;Verbal cues   Comprehension Verbalized understanding;Returned demonstration           PT Short Term Goals - 05/08/16 1413      PT SHORT TERM GOAL #1   Title Patient with family cues demonstrates understanding of [place] HEP. (Target Date: 05/08/2016)   Baseline GOAL MET: 05/08/16   Time 1   Period Months   Status Achieved     PT SHORT TERM GOAL #2   Title Timed Up-Go <15sec with supervision. (Target Date: 05/08/2016)   Baseline TUG: 18.87sec; 05/08/16   Time 1   Period Months   Status On-going     PT SHORT TERM GOAL #3   Title Standing balance turning head to look to side and reaches  5" & to floor with supervision. (Target Date: 05/08/2016)   Baseline GOAL MET: 05/08/16   Time 1   Period Months   Status Achieved     PT SHORT TERM GOAL #4   Title Patient ambulates 250' around obstacles with LRAD with supervision. (Target Date: 05/08/2016)   Baseline GOAL MET: 05/08/16   Time 1   Period Months   Status Achieved     PT SHORT TERM GOAL #5   Title Cognitive TUG <25sec with simple naming task.  (Target Date: 05/08/2016)   Baseline Cog TUG: 45.34sec; 05/08/16   Time 1   Period Months   Status On-going           PT Long Term Goals - 05/30/16 1555      PT LONG TERM GOAL #1   Title Patient and family demonstrate & verbalize understanding of ongoing fitness plan & HEP. (Target Date: 05/31/2016)   Baseline GOAL MET: 05/30/16   Time 2   Period Months   Status Achieved     PT LONG TERM GOAL #2   Title Timed Up-Go standard <13.5sec safely without device. (Target Date: 05/31/2016)   Baseline 05/28/16 - Normal TUG: 24.66 with RW; Cog TUG: 53.69 with RW and naming grandchildren   Time 2   Period Months   Status Not Met     PT LONG TERM GOAL #3   Title Berg Balance >36/56 to indicate lower fall risk. (Target Date: 05/31/2016)   Baseline GOAL MET: 05/28/16 - 41/56   Time 2   Period Months   Status Achieved     PT LONG TERM GOAL #4   Title Dynamic Gait Index >/= 10/24 to indicate lower fall risk. (Target Date: 05/31/2016)   Baseline GOAL MET: 13/24    Time 2   Period Months   Status Achieved     PT LONG TERM GOAL #5   Title Patient ambulates 500' with LRAD with supervision for cognitive deficits no balance losses safely. (Target Date: 05/31/2016)   Baseline GOAL MET: 05/28/16   Time 2   Period Months   Status Achieved     PT LONG TERM GOAL #6   Title Patient negotiates ramps, curbs & stairs (1 rail) with LRAD with supervision from family safely. (Target Date: 05/31/2016)   Baseline GOAL MET: 05/30/16   Time 2   Period Months   Status Achieved     PT LONG TERM GOAL #7   Title Patient ambulates around furniture with LRAD modified independent for household mobility. (Target Date: 05/31/2016)   Baseline GOAL PARTIALLY MET: 05/28/16 - Pt required supervision and RW while ambulating   Time 2   Period Months   Status Partially Met               Plan - 05/30/16 1556    Clinical Impression Statement Pt performed well in today's session and was able to meet the remaining LTGs that needed to be assessed indicating a significant improvement in her overall functional status.  Pt scored a 13/24 on the DGI (previously a 1/24 on 04/08/16) indicating a decr in fall risk and incr in functional mobility.  She was also able to negotiate stairs, curbs, and ramps with supervision indicating an incr in safety when at home or in the community.  PT discussed with pt the progress that has been made since the initial evaluation and recommended the pt be d/c from therapy at this time with the potential of return if her condition warrented  further treatment.  Pt agreed to d/c and reported being very satisfied with ther progress she has made.  Pt was instructed to continue to perform the [place] program as an on-going HEP/fitness plan with assistance from her sons as needed.  Due to pt satisfaction of progress and the majority of her goals being met, pt will be d/c from therapy.   Rehab Potential Good   PT Frequency 2x / week   PT Duration Other  (comment)   PT Treatment/Interventions ADLs/Self Care Home Management;DME Instruction;Gait training;Stair training;Functional mobility training;Therapeutic activities;Therapeutic exercise;Balance training;Neuromuscular re-education;Patient/family education;Orthotic Fit/Training;Vestibular   Consulted and Agree with Plan of Care Patient      Patient will benefit from skilled therapeutic intervention in order to improve the following deficits and impairments:  Abnormal gait, Decreased activity tolerance, Decreased balance, Decreased coordination, Decreased endurance, Decreased mobility, Decreased safety awareness, Decreased strength, Impaired vision/preception, Postural dysfunction  Visit Diagnosis: Hemiplegia and hemiparesis following unspecified cerebrovascular disease affecting left non-dominant side (HCC)  Other abnormalities of gait and mobility  Unsteadiness on feet  Muscle weakness (generalized)     Problem List Patient Active Problem List   Diagnosis Date Noted  . Pain and swelling of toe of left foot 05/29/2016  . Cellulitis of toe of left foot 05/29/2016  . Diabetic neuropathy, painful ([place]) 04/24/2016  . Left carotid artery stenosis   . Hypertriglyceridemia, essential 01/10/2016  . Cerebral infarction due to thrombosis of right posterior cerebral artery ([place]) 01/10/2016  . Type 2 diabetes mellitus with diabetic neuropathy, with long-term current use of insulin ([place]) 10/06/2015  . Osteoporosis, unspecified 03/10/2014  . Left bundle branch block 07/01/2013  . Coronary atherosclerosis of native coronary artery 02/16/2013  . Chronic systolic heart failure ([place]) 08/16/2012  . Congestive dilated cardiomyopathy ([place]) 08/02/2012  . HTN (hypertension) 08/02/2012  . Familial tremor 11/29/2010  . VITAMIN D DEFICIENCY 04/04/2010  . Osteoarthrosis, unspecified whether generalized or localized, unspecified site 04/04/2010  . Hyperlipidemia with target LDL less than 70 01/19/2007  .  Hypothyroidism 01/14/2007   PHYSICAL THERAPY DISCHARGE SUMMARY  Visits from Start of Care: 15  Current functional level related to goals / functional outcomes: See above    Remaining deficits: See above   Education / Equipment: See pt education  Plan: Patient agrees to discharge.  Patient goals were partially met. Patient is being discharged due to being pleased with the current functional level.  ?????        Katherine Mantle, SPT 05/30/2016, 4:05 PM  [facility] [address] [place], [place], [address] Phone: [phone]   Fax:  [phone]  Name: [name] [name] MRN: [id number] Date of Birth: Nov 13, 1933

## 2016-06-04 ENCOUNTER — Ambulatory Visit: Payer: Medicare Other | Admitting: Physical Therapy

## 2016-06-04 ENCOUNTER — Ambulatory Visit: Payer: Medicare Other

## 2016-06-04 ENCOUNTER — Encounter: Payer: Medicare Other | Admitting: Occupational Therapy

## 2016-06-04 DIAGNOSIS — I69954 Hemiplegia and hemiparesis following unspecified cerebrovascular disease affecting left non-dominant side: Secondary | ICD-10-CM | POA: Diagnosis not present

## 2016-06-04 DIAGNOSIS — R29818 Other symptoms and signs involving the nervous system: Secondary | ICD-10-CM | POA: Diagnosis not present

## 2016-06-04 DIAGNOSIS — R41842 Visuospatial deficit: Secondary | ICD-10-CM | POA: Diagnosis not present

## 2016-06-04 DIAGNOSIS — R2681 Unsteadiness on feet: Secondary | ICD-10-CM | POA: Diagnosis not present

## 2016-06-04 DIAGNOSIS — R41844 Frontal lobe and executive function deficit: Secondary | ICD-10-CM | POA: Diagnosis not present

## 2016-06-04 DIAGNOSIS — R41841 Cognitive communication deficit: Secondary | ICD-10-CM | POA: Diagnosis not present

## 2016-06-04 NOTE — Therapy (Signed)
[facility] [address] [place], [place], [address] Phone: [phone]   Fax:  [phone]  Speech Language Pathology Treatment  Patient Details  Name: [name] [name] MRN: [id number] Date of Birth: 01/23/1934 Referring Provider: Marvel Plan  Encounter Date: 06/04/2016      End of Session - 06/04/16 1613    Visit Number 1   Number of Visits 17   Date for SLP Re-Evaluation 08/09/16   SLP Start Time 1447   SLP Stop Time  1532   SLP Time Calculation (min) 45 min   Activity Tolerance Patient tolerated treatment well      Past Medical History:  Diagnosis Date  . Chronic combined systolic and diastolic CHF, NYHA class 3 (HCC)    a. 03/2013 Echo: EF 25-30%, diff HK, sev antsept HK, mild MR.  . Congestive dilated cardiomyopathy (HCC)    a. 03/2013 Echo: EF 25-30%;  b. 09/2013 s/p SJM 3242 CRT-P.  Marland Kitchen Coronary artery disease    a. 08/2012 Cath: LM nl, LAD 16m, LCX nondom, mild-mod nonobs dzs mid, OM1 ok, OM2 90/90p, RCA 40, EF 25-30%-->Med Rx.  . GERD (gastroesophageal reflux disease)   . High cholesterol   . Hypertension   . Hypothyroidism   . Left bundle branch block   . Osteoarthritis   . Stroke (HCC)   . Type II diabetes mellitus (HCC)     Past Surgical History:  Procedure Laterality Date  . ABDOMINAL HYSTERECTOMY  1980  . BI-VENTRICULAR PACEMAKER INSERTION N/A 10/01/2013   Procedure: BI-VENTRICULAR PACEMAKER INSERTION (CRT-P);  Surgeon: Duke Salvia, MD;  [facility] LAB;  Service: Cardiovascular;  Laterality: N/A;  . BI-VENTRICULAR PACEMAKER INSERTION (CRT-P)     a. 09/2013 s/p SJM 3242 CRT-P.  . Bilateral cateract surgery    . BMD  2006  . ORIF TIBIA PLATEAU Right 12/04/2013   Procedure: OPEN REDUCTION INTERNAL FIXATION (ORIF) TIBIAL PLATEAU;  Surgeon: Verlee Rossetti, MD;  [facility];  Service: Orthopedics;  Laterality: Right;  . RIGHT HEART CATHETERIZATION N/A 09/08/2012   Procedure: RIGHT HEART  CATH;  Surgeon: Dolores Patty, MD;  Location: [facility] CATH LAB;  Service: Cardiovascular;  Laterality: N/A;    There were no vitals filed for this visit.      Subjective Assessment - 06/04/16 1500    Subjective "I don't know if I'm on a blood thinner or not." Pt managed medicines prior to CVA. "I know I could drive."    Currently in Pain? No/denies           SLP Evaluation [facility] - 06/04/16 1500      SLP Visit Information   SLP Received On 06/04/16   Referring Provider Marvel Plan   Onset Date May 2017   Medical Diagnosis CVA     General Information   HPI Pt just finished PT and OT at this clinic. OT defined cognitive deficits with pt. Pt has 24/7 care by sons, pt is intermittently left alone for max 60-90 minutes during the day from 4-5pm.      Prior Functional Status   Cognitive/Linguistic Baseline Within functional limits     Cognition   Overall Cognitive Status Impaired/Different from baseline   Area of Impairment Memory;Safety/judgement;Awareness   Memory Decreased short-term memory;Decreased recall of precautions   Memory Comments Son states pt does not always remember to use walker.    Safety/Judgement Decreased awareness of safety;Decreased awareness of deficits   Safety and Judgement Comments Son stated pt  may attempt to use stove wihtout supervision   Awareness Intellectual   Awareness Comments Pt told SLP she felt confident she could drive, felt like nothing was hindered after CVA.   Memory --   Memory Impairment --   Awareness --   Problem Solving --     Auditory Comprehension   Overall Auditory Comprehension Appears within functional limits for tasks assessed     Verbal Expression   Overall Verbal Expression Appears within functional limits for tasks assessed     Oral Motor/Sensory Function   Overall Oral Motor/Sensory Function Appears within functional limits for tasks assessed     Motor Speech   Overall Motor Speech Appears within functional limits  for tasks assessed     Standardized Assessments   Standardized Assessments  Other Assessment  Hopkins Verbal Learning Test   Other Assessment Recall: 5/12 (WNL), 7/12 (WNL), 7/12 (deficient); Recognition: 8/12 (deficient)-pt had 4 items mistakenly ID'd that were categorical/related false positives              SLP Education - 06/04/16 1611    Education provided Yes   Education Details MEMORY TIPS handout, suggested alarms for pt-directed times of med adminstration, hook by door for pt's pocketbook, calendar for appointmets/chores/errands   Person(s) Educated Patient;Child(ren)  Selena Batten (son)   Methods Explanation;Handout   Comprehension Verbalized understanding;Need further instruction  pt needs further understanding          SLP Short Term Goals - 06/04/16 1617      SLP SHORT TERM GOAL #1   Title pt knowledge of memory book/organizer contents (homework, meds, calendar, schedule, errands, etc) when asked questions about these things, 100% of the time with rare min verbal cues   Time 4   Period Weeks   Status New     SLP SHORT TERM GOAL #2   Title pt will demo knowledge of where to look in memory book/organizer to find information on pertinent items (meds, schedule, calendar, errands, etc) with rare mod verbal cues   Time 4   Period Weeks   Status New     SLP SHORT TERM GOAL #3   Title pt will demo incr'd intellectual awareness by providing 2 safety precautions with occasional min questioning cues   Time 4   Period Weeks   Status New          SLP Long Term Goals - 06/04/16 1622      SLP LONG TERM GOAL #1   Title pt will demo knowledge of infomration in memory book with rare min nonverbal cues over three sessions   Time 8   Period Weeks   Status New     SLP LONG TERM GOAL #2   Title pt will demo knowledge about where infomration is located in memory book with rare min verbal cues over three sessions    Time 8   Period Weeks   Status New     SLP LONG TERM  GOAL #3   Title pt will demo verbal safety awareness when asked questions about daily activities, 90% of the time with rare min A over three sessions   Time 8   Period Weeks   Status New     SLP LONG TERM GOAL #4   Title pt or family will report pt using memory or cognitive compensations at home between 5 therapy sessions   Time 8   Period Weeks   Status New          Plan -  06/04/16 1613    Clinical Impression Statement Pt presents today with mod-severe cognitive linguistic deficits in memory and in intellectual awareness which result in likely decr in safety awareness and decr'd awareness of deficits. Pt has 24/7 care currently, with sons. Pt would benefit from skilled ST to assist family in developing home strategies to incr pt's independence and decr caregiver burden.   Speech Therapy Frequency 2x / week   Duration --  8 weeks   Treatment/Interventions Compensatory techniques;Internal/external aids;SLP instruction and feedback;Patient/family education;Cueing hierarchy;Environmental controls;Cognitive reorganization   Potential to Achieve Goals Fair   Potential Considerations Severity of impairments   Consulted and Agree with Plan of Care Patient;Family member/caregiver   Family Member Consulted son, Selena BattenKim      Patient will benefit from skilled therapeutic intervention in order to improve the following deficits and impairments:   Cognitive communication deficit      G-Codes - 06/04/16 1625    Functional Assessment Tool Used noms (level 4), clinical judgement- (60-65% impaired)   Functional Limitations Memory   Memory Current Status (W0981(G9168) At least 60 percent but less than 80 percent impaired, limited or restricted   Memory Goal Status (X9147(G9169) At least 40 percent but less than 60 percent impaired, limited or restricted      Problem List Patient Active Problem List   Diagnosis Date Noted  . Pain and swelling of toe of left foot 05/29/2016  . Cellulitis of toe of left foot  05/29/2016  . Diabetic neuropathy, painful (HCC) 04/24/2016  . Left carotid artery stenosis   . Hypertriglyceridemia, essential 01/10/2016  . 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 Type 2 diabetes mellitus with diabetic neuropathy, with long-term current use of insulin (HCC) 10/06/2015  . Osteoporosis, unspecified 03/10/2014  . Left bundle branch block 07/01/2013  . Coronary atherosclerosis of native coronary artery 02/16/2013  . Chronic systolic heart failure (HCC) 08/16/2012  . Congestive dilated cardiomyopathy (HCC) 08/02/2012  . HTN (hypertension) 08/02/2012  . Familial tremor 11/29/2010  . VITAMIN D DEFICIENCY 04/04/2010  . Osteoarthrosis, unspecified whether generalized or localized, unspecified site 04/04/2010  . Hyperlipidemia with target LDL less than 70 01/19/2007  . Hypothyroidism 01/14/2007    [facility] - Williams WayCHINKE,Chasen Mendell ,MS, CCC-SLP  06/04/2016, 4:30 PM  [facility] [address] [place], [place], [address] Phone: [phone]   Fax:  [phone]   Name: [name] [name] MRN: [id number] Date of Birth: 04/11/1934

## 2016-06-04 NOTE — Patient Instructions (Signed)

## 2016-06-05 ENCOUNTER — Ambulatory Visit (HOSPITAL_COMMUNITY)
Admission: RE | Admit: 2016-06-05 | Discharge: 2016-06-05 | Disposition: A | Discharge status: Home / Self Care | Payer: Medicare Other | Source: Ambulatory Visit | Attending: Cardiovascular Disease | Admitting: Cardiovascular Disease

## 2016-06-05 DIAGNOSIS — E1151 Type 2 diabetes mellitus with diabetic peripheral angiopathy without gangrene: Secondary | ICD-10-CM

## 2016-06-05 DIAGNOSIS — Z720 Tobacco use: Secondary | ICD-10-CM | POA: Diagnosis not present

## 2016-06-05 DIAGNOSIS — I10 Essential (primary) hypertension: Secondary | ICD-10-CM | POA: Diagnosis not present

## 2016-06-05 DIAGNOSIS — I96 Gangrene, not elsewhere classified: Secondary | ICD-10-CM | POA: Diagnosis not present

## 2016-06-05 DIAGNOSIS — E1152 Type 2 diabetes mellitus with diabetic peripheral angiopathy with gangrene: Secondary | ICD-10-CM | POA: Diagnosis not present

## 2016-06-05 DIAGNOSIS — I7789 Other specified disorders of arteries and arterioles: Secondary | ICD-10-CM | POA: Insufficient documentation

## 2016-06-05 DIAGNOSIS — R0989 Other specified symptoms and signs involving the circulatory and respiratory systems: Secondary | ICD-10-CM | POA: Diagnosis not present

## 2016-06-05 DIAGNOSIS — E785 Hyperlipidemia, unspecified: Secondary | ICD-10-CM | POA: Diagnosis not present

## 2016-06-06 ENCOUNTER — Encounter: Payer: Medicare Other | Admitting: Occupational Therapy

## 2016-06-06 ENCOUNTER — Ambulatory Visit: Payer: Medicare Other | Admitting: Physical Therapy

## 2016-06-06 ENCOUNTER — Ambulatory Visit: Payer: Medicare Other | Admitting: *Deleted

## 2016-06-06 DIAGNOSIS — R2681 Unsteadiness on feet: Secondary | ICD-10-CM | POA: Diagnosis not present

## 2016-06-06 DIAGNOSIS — R41841 Cognitive communication deficit: Secondary | ICD-10-CM

## 2016-06-06 DIAGNOSIS — I69954 Hemiplegia and hemiparesis following unspecified cerebrovascular disease affecting left non-dominant side: Secondary | ICD-10-CM | POA: Diagnosis not present

## 2016-06-06 DIAGNOSIS — R41844 Frontal lobe and executive function deficit: Secondary | ICD-10-CM | POA: Diagnosis not present

## 2016-06-06 DIAGNOSIS — R29818 Other symptoms and signs involving the nervous system: Secondary | ICD-10-CM | POA: Diagnosis not present

## 2016-06-06 DIAGNOSIS — R41842 Visuospatial deficit: Secondary | ICD-10-CM | POA: Diagnosis not present

## 2016-06-06 NOTE — Therapy (Signed)
[facility] [address] [place], [place], [address] Phone: [phone]   Fax:  [phone]  Speech Language Pathology Treatment  Patient Details  Name: [name] [name] MRN: [id number] Date of Birth: [birth year] Referring Provider: Marvel Plan  Encounter Date: 06/06/2016      End of Session - 06/06/16 1647    Visit Number 2   Number of Visits 17   Date for SLP Re-Evaluation 08/09/16   SLP Start Time 1401   SLP Stop Time  1442   SLP Time Calculation (min) 41 min   Activity Tolerance Patient tolerated treatment well      Past Medical History:  Diagnosis Date  . Chronic combined systolic and diastolic CHF, NYHA class 3 (HCC)    a. 03/2013 Echo: EF 25-30%, diff HK, sev antsept HK, mild MR.  . Congestive dilated cardiomyopathy (HCC)    a. 03/2013 Echo: EF 25-30%;  b. 09/2013 s/p SJM 3242 CRT-P.  Marland Kitchen Coronary artery disease    a. 08/2012 Cath: LM nl, LAD 40m, LCX nondom, mild-mod nonobs dzs mid, OM1 ok, OM2 90/90p, RCA 40, EF 25-30%-->Med Rx.  . GERD (gastroesophageal reflux disease)   . High cholesterol   . Hypertension   . Hypothyroidism   . Left bundle branch block   . Osteoarthritis   . Stroke (HCC)   . Type II diabetes mellitus (HCC)     Past Surgical History:  Procedure Laterality Date  . ABDOMINAL HYSTERECTOMY  1980  . BI-VENTRICULAR PACEMAKER INSERTION N/A 10/01/2013   Procedure: BI-VENTRICULAR PACEMAKER INSERTION (CRT-P);  Surgeon: Duke Salvia, MD;  Location: [facility] CATH LAB;  Service: Cardiovascular;  Laterality: N/A;  . BI-VENTRICULAR PACEMAKER INSERTION (CRT-P)     a. 09/2013 s/p SJM 3242 CRT-P.  . Bilateral cateract surgery    . BMD  2006  . ORIF TIBIA PLATEAU Right 12/04/2013   Procedure: OPEN REDUCTION INTERNAL FIXATION (ORIF) TIBIAL PLATEAU;  Surgeon: Verlee Rossetti, MD;  Location: [facility] ORS;  Service: Orthopedics;  Laterality: Right;  . RIGHT HEART CATHETERIZATION N/A 09/08/2012   Procedure: RIGHT HEART  CATH;  Surgeon: Dolores Patty, MD;  Location: [facility] CATH LAB;  Service: Cardiovascular;  Laterality: N/A;    There were no vitals filed for this visit.      Subjective Assessment - 06/06/16 1411    Subjective "The notebook is helping me"   Pain Score 0-No pain               ADULT SLP TREATMENT - 06/06/16 0001      General Information   Behavior/Cognition Alert;Cooperative     Treatment Provided   Treatment provided Cognitive-Linquistic     Pain Assessment   Pain Assessment No/denies pain     Cognitive-Linquistic Treatment   Treatment focused on Cognition   Skilled Treatment Pt did not bring memory notebook with her to session; session consisted of SLP providing detailed organization of pt's daily schedule re: medication administration, daily responsibilities and appointments scheduled with pt with min-mod A required with review of memory strategies (i.e.: sticky notes, alarms, environmental cues) as pt provided vague answers and would often say "my son keeps up with that"; Intellectual awareness decreased with simple problem solving tasks with mod A needed to determine next steps in possible home situations/emergencies.       Assessment / Recommendations / Plan   Plan Continue with current plan of care     Progression Toward Goals   Progression toward goals Progressing toward goals  SLP Short Term Goals - 06/06/16 1649      SLP SHORT TERM GOAL #1   Title pt knowledge of memory book/organizer contents (homework, meds, calendar, schedule, errands, etc) when asked questions about these things, 100% of the time with rare min verbal cues   Time 4   Period Weeks   Status New     SLP SHORT TERM GOAL #2   Title pt will demo knowledge of where to look in memory book/organizer to find information on pertinent items (meds, schedule, calendar, errands, etc) with rare mod verbal cues   Time 4   Period Weeks   Status New     SLP SHORT TERM GOAL #3   Title  pt will demo incr'd intellectual awareness by providing 2 safety precautions with occasional min questioning cues   Time 4   Period Weeks   Status New          SLP Long Term Goals - 06/06/16 1649      SLP LONG TERM GOAL #1   Title pt will demo knowledge of infomration in memory book with rare min nonverbal cues over three sessions   Time 8   Period Weeks   Status New     SLP LONG TERM GOAL #2   Title pt will demo knowledge about where infomration is located in memory book with rare min verbal cues over three sessions    Time 8   Period Weeks   Status New     SLP LONG TERM GOAL #3   Title pt will demo verbal safety awareness when asked questions about daily activities, 90% of the time with rare min A over three sessions   Time 8   Period Weeks   Status New     SLP LONG TERM GOAL #4   Title pt or family will report pt using memory or cognitive compensations at home between 5 therapy sessions   Time 8   Period Weeks   Status New          Plan - 06/06/16 1648    Clinical Impression Statement Pt con't with mod-severe decreased intellectual awareness/safety as pt needed min-mod A utilizing environmental cues/strategies (i.e.: notebook, alarms, etc.) Pt would continue to benefit from skilled ST to assist family in developing home strategies to incr pt's independence and decr caregiver burden.   Speech Therapy Frequency 2x / week   Duration --  8 weeks   Treatment/Interventions Compensatory techniques;Internal/external aids;SLP instruction and feedback;Patient/family education;Cueing hierarchy;Environmental controls;Cognitive reorganization   Potential to Achieve Goals Fair   Potential Considerations Severity of impairments   Consulted and Agree with Plan of Care Patient;Family member/caregiver   Family Member Consulted son, Selena Batten      Patient will benefit from skilled therapeutic intervention in order to improve the following deficits and impairments:   Cognitive  communication deficit    Problem List Patient Active Problem List   Diagnosis Date Noted  . Pain and swelling of toe of left foot 05/29/2016  . Cellulitis of toe of left foot 05/29/2016  . Diabetic neuropathy, painful (HCC) 04/24/2016  . Left carotid artery stenosis   . Hypertriglyceridemia, essential 01/10/2016  . 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 Type 2 diabetes mellitus with diabetic neuropathy, with long-term current use of insulin (HCC) 10/06/2015  . Osteoporosis, unspecified 03/10/2014  . Left bundle branch block 07/01/2013  . Coronary atherosclerosis of native coronary artery 02/16/2013  . Chronic systolic heart  failure (HCC) 08/16/2012  . Congestive dilated cardiomyopathy (HCC) 08/02/2012  . HTN (hypertension) 08/02/2012  . Familial tremor 11/29/2010  . VITAMIN D DEFICIENCY 04/04/2010  . Osteoarthrosis, unspecified whether generalized or localized, unspecified site 04/04/2010  . Hyperlipidemia with target LDL less than 70 01/19/2007  . Hypothyroidism 01/14/2007    Mirka Barbone,PAT, M.S., CCC-SLP 06/06/2016, 4:53 PM  [facility] [address] [place], [place], [address] Phone: [phone]   Fax:  [phone]   Name: Despina Luff MRN: [id number] Date of Birth: 1933-12-28

## 2016-06-07 ENCOUNTER — Ambulatory Visit (INDEPENDENT_AMBULATORY_CARE_PROVIDER_SITE_OTHER): Payer: Medicare Other | Admitting: Cardiovascular Disease

## 2016-06-07 ENCOUNTER — Encounter: Payer: Self-pay | Admitting: Cardiovascular Disease

## 2016-06-07 ENCOUNTER — Other Ambulatory Visit: Payer: Self-pay | Admitting: *Deleted

## 2016-06-07 VITALS — BP 100/68 | HR 74 | Ht 65.0 in | Wt 123.0 lb

## 2016-06-07 DIAGNOSIS — M79675 Pain in left toe(s): Secondary | ICD-10-CM

## 2016-06-07 DIAGNOSIS — Z7902 Long term (current) use of antithrombotics/antiplatelets: Secondary | ICD-10-CM | POA: Diagnosis not present

## 2016-06-07 DIAGNOSIS — I639 Cerebral infarction, unspecified: Secondary | ICD-10-CM

## 2016-06-07 DIAGNOSIS — I70229 Atherosclerosis of native arteries of extremities with rest pain, unspecified extremity: Secondary | ICD-10-CM

## 2016-06-07 DIAGNOSIS — I998 Other disorder of circulatory system: Secondary | ICD-10-CM | POA: Diagnosis not present

## 2016-06-07 DIAGNOSIS — M7989 Other specified soft tissue disorders: Secondary | ICD-10-CM

## 2016-06-07 DIAGNOSIS — Z01818 Encounter for other preprocedural examination: Secondary | ICD-10-CM | POA: Diagnosis not present

## 2016-06-07 LAB — CBC WITH DIFFERENTIAL/PLATELET
BASOS ABS: 0 {cells}/uL (ref 0–200)
BASOS PCT: 0 %
EOS ABS: 210 {cells}/uL (ref 15–500)
EOS PCT: 3 %
HCT: 39.7 % (ref 35.0–45.0)
HEMOGLOBIN: 12.6 g/dL (ref 11.7–15.5)
LYMPHS ABS: 3290 {cells}/uL (ref 850–3900)
Lymphocytes Relative: 47 %
MCH: 25.4 pg — AB (ref 27.0–33.0)
MCHC: 31.7 g/dL — ABNORMAL LOW (ref 32.0–36.0)
MCV: 80 fL (ref 80.0–100.0)
MPV: 10.2 fL (ref 7.5–12.5)
Monocytes Absolute: 420 cells/uL (ref 200–950)
Monocytes Relative: 6 %
NEUTROS ABS: 3080 {cells}/uL (ref 1500–7800)
Neutrophils Relative %: 44 %
Platelets: 265 10*3/uL (ref 140–400)
RBC: 4.96 MIL/uL (ref 3.80–5.10)
RDW: 15.4 % — ABNORMAL HIGH (ref 11.0–15.0)
WBC: 7 10*3/uL (ref 3.8–10.8)

## 2016-06-07 NOTE — Progress Notes (Signed)
06/07/2016 [name] [name]   09/11/1933  [id number]  Primary Physician Sanda Lingerhomas Jones, MD Primary Cardiologist: Runell GessJonathan J Berry MD Roseanne RenoFACP, FACC, FAHA, FSCAI  HPI:  [name] [name] is a delightful 82 year old thin and fit frail-appearing. African-American female mother of 4, grandmother 7 grandchildren accompanied by her son came today. She was referred by her podiatrist, Dr. Leary Rocaegel for peripheral vascular evaluation because of critical limb ischemia at a gangrenous left second toe. She sees Dr. Dian SituBensomohn  for diastolic heart failure and noncritical CAD. She does have a history of diabetes, hyperlipidemia, diastolic heart failure and noncritical CAD by cath January 2014. She's had a blackened left second toe for last 2 months that has become progressively gangrenous. Lower extremity arterial Doppler studies performed 06/05/16 revealed a right ABI 0.62 and a left of 0.46. She does have a high-frequency signal in her left popliteal artery suggesting high-grade disease as well as 1 vessel runoff via peroneal. She will need percutaneous revascularization for limb salvage.   Current Outpatient Prescriptions  Medication Sig Dispense Refill  . amoxicillin-clavulanate (AUGMENTIN) 875-125 MG tablet Take 1 tablet by mouth 2 (two) times daily. 28 tablet 1  . aspirin 325 MG tablet Take 1 tablet (325 mg total) by mouth daily. 30 tablet 1  . aspirin EC 81 MG tablet Take 81 mg by mouth daily.    . carvedilol (COREG) 12.5 MG tablet TAKE 1 TABLET TWICE A DAY WITH MEALS 180 tablet 5  . carvedilol (COREG) 6.25 MG tablet Take 6.25 mg by mouth 2 (two) times daily with a meal.    . clopidogrel (PLAVIX) 75 MG tablet Take 1 tablet (75 mg total) by mouth daily. 90 tablet 2  . co-enzyme Q-10 30 MG capsule Take 1 capsule (30 mg total) by mouth daily. 30 capsule 2  . furosemide (LASIX) 20 MG tablet Take 20 mg by mouth daily as needed for fluid.     Marland Kitchen. glipiZIDE (GLUCOTROL) 10 MG tablet TAKE 1 TABLET TWICE A DAY BEFORE MEALS  180 tablet 1  . HYDROcodone-acetaminophen (NORCO/VICODIN) 5-325 MG tablet Take 1 tablet by mouth every 6 (six) hours as needed for moderate pain. 75 tablet 0  . hydrOXYzine (ATARAX/VISTARIL) 25 MG tablet Take 1 tablet (25 mg total) by mouth 3 (three) times daily as needed. 60 tablet 3  . Insulin Glargine (LANTUS SOLOSTAR) 100 UNIT/ML Solostar Pen Inject 18 Units into the skin every morning. 15 mL 10  . LANTUS SOLOSTAR 100 UNIT/ML Solostar Pen INJECT 14 UNITS SUBCUTANEOUSLY BEFORE BREAKFAST 15 mL 1  . levothyroxine (SYNTHROID, LEVOTHROID) 112 MCG tablet Take 1 tablet (112 mcg total) by mouth daily. 90 tablet 3  . lisinopril (PRINIVIL,ZESTRIL) 5 MG tablet Take 1 tablet (5 mg total) by mouth 2 (two) times daily. 180 tablet 3  . Omega-3 Fatty Acids (FISH OIL PO) Take by mouth.    Marland Kitchen. omeprazole (PRILOSEC) 20 MG capsule Take 20 mg by mouth daily as needed (for acid reflux/heartburn.).     Marland Kitchen. rosuvastatin (CRESTOR) 20 MG tablet Take 1 tablet (20 mg total) by mouth daily. 90 tablet 2  . Vitamin D, Ergocalciferol, (DRISDOL) 50000 units CAPS capsule TAKE 1 CAPSULE EVERY 7 DAYS ON MONDAYS 12 capsule 0   No current facility-administered medications for this visit.     Allergies  Allergen Reactions  . Statins Other (See Comments)    Leg cramping  . Metformin And Related     Pt stopped it due to diarrhea  . Vytorin [Ezetimibe-Simvastatin] Other (See Comments)  Leg cramps    Social History   Social History  . Marital status: Divorced    Spouse name: N/A  . Number of children: 4  . Years of education: N/A   Occupational History  . Retired Engineer, civil (consulting)    Social History Main Topics  . Smoking status: Former Games developer  . Smokeless tobacco: Former Neurosurgeon  . Alcohol use No  . Drug use: No  . Sexual activity: Not Currently   Other Topics Concern  . Not on file   Social History Narrative   Widowed.  Lives with her son.  Normally able to ambulate without assistance.  Quit smoking as a teenager.                 Review of Systems: General: negative for chills, fever, night sweats or weight changes.  Cardiovascular: negative for chest pain, dyspnea on exertion, edema, orthopnea, palpitations, paroxysmal nocturnal dyspnea or shortness of breath Dermatological: negative for rash Respiratory: negative for cough or wheezing Urologic: negative for hematuria Abdominal: negative for nausea, vomiting, diarrhea, bright red blood per rectum, melena, or hematemesis Neurologic: negative for visual changes, syncope, or dizziness All other systems reviewed and are otherwise negative except as noted above.    Blood pressure 100/68, pulse 74, height 5\' 5"  (1.651 m), weight 123 lb (55.8 kg).  General appearance: alert and no distress Neck: no adenopathy, no carotid bruit, no JVD, supple, symmetrical, trachea midline and thyroid not enlarged, symmetric, no tenderness/mass/nodules Lungs: clear to auscultation bilaterally Heart: regular rate and rhythm, S1, S2 normal, no murmur, click, rub or gallop Extremities: extremities normal, atraumatic, no cyanosis or edema and Gangrenous left second toe  EKG not performed today  ASSESSMENT AND PLAN:   Pain and swelling of toe of left foot Mrs. Fabris was referred by Dr. Leary Roca for a gangrenous left second toe which has been present for the last 2 months. She has a history of long-standing diabetes, hypertension and hyperlipidemia. She did have diastolic heart failure with noncritical CAD by heart cath. Her EF has improved. Over the last 2 months she's had a progressive leak gangrenous left second toe although she denies claudication. Dopplers performed office 06/05/16 revealed a right ABI 0.62 and a left of 0.46 with a high-frequency signal in her left popliteal artery and one vessel runoff via peroneal. I believe that this will progress unless she has percutaneous revascularization of her popliteal artery at least if not one of her tibial arteries to the foot.  Otherwise, I suspect that she will potentially lose her left leg below the knee. I will arrange angiography and intervention.      Runell Gess MD FACP,FACC,FAHA, [facility] 06/07/2016 11:31 AM

## 2016-06-07 NOTE — Patient Instructions (Signed)
Medication Instructions:  Continue current medications.   Testing/Procedures: Dr. Allyson Sabal has ordered a peripheral angiogram to be done at [facility]  This procedure is going to look at the bloodflow in your lower extremities.  If Dr. Allyson Sabal is able to open up the arteries, you will have to spend one night in the hospital.  If he is not able to open the arteries, you will be able to go home that same day.    After the procedure, you will not be allowed to drive for 3 days or push, pull, or lift anything greater than 10 lbs for one week.    You will be required to have the following tests prior to the procedure:  1. Blood work-the blood work can be done no more than 14 days prior to the procedure.  It can be done at any [facility] lab.  There is one downstairs on the first floor of this building and one in the [facility] building [id number])     *REPS  SCOTT AND ERIC  Puncture site  RIGHT GROIN    If you need a refill on your cardiac medications before your next appointment, please call your pharmacy.

## 2016-06-07 NOTE — Assessment & Plan Note (Signed)
[name] [name] was referred by Dr. Leary Roca for a gangrenous left second toe which has been present for the last 2 months. She has a history of long-standing diabetes, hypertension and hyperlipidemia. She did have diastolic heart failure with noncritical CAD by heart cath. Her EF has improved. Over the last 2 months she's had a progressive leak gangrenous left second toe although she denies claudication. Dopplers performed office 06/05/16 revealed a right ABI 0.62 and a left of 0.46 with a high-frequency signal in her left popliteal artery and one vessel runoff via peroneal. I believe that this will progress unless she has percutaneous revascularization of her popliteal artery at least if not one of her tibial arteries to the foot. Otherwise, I suspect that she will potentially lose her left leg below the knee. I will arrange angiography and intervention.

## 2016-06-08 LAB — PROTIME-INR
INR: 1
Prothrombin Time: 10.9 s (ref 9.0–11.5)

## 2016-06-08 LAB — BASIC METABOLIC PANEL
BUN: 18 mg/dL (ref 7–25)
CHLORIDE: 104 mmol/L (ref 98–110)
CO2: 21 mmol/L (ref 20–31)
Calcium: 9.7 mg/dL (ref 8.6–10.4)
Creat: 1.29 mg/dL — ABNORMAL HIGH (ref 0.60–0.88)
Glucose, Bld: 236 mg/dL — ABNORMAL HIGH (ref 65–99)
POTASSIUM: 4.8 mmol/L (ref 3.5–5.3)
SODIUM: 138 mmol/L (ref 135–146)

## 2016-06-08 LAB — APTT: APTT: 25 s (ref 22–34)

## 2016-06-10 ENCOUNTER — Ambulatory Visit: Payer: Medicare Other | Admitting: *Deleted

## 2016-06-10 DIAGNOSIS — R2681 Unsteadiness on feet: Secondary | ICD-10-CM | POA: Diagnosis not present

## 2016-06-10 DIAGNOSIS — R41842 Visuospatial deficit: Secondary | ICD-10-CM | POA: Diagnosis not present

## 2016-06-10 DIAGNOSIS — R41841 Cognitive communication deficit: Secondary | ICD-10-CM | POA: Diagnosis not present

## 2016-06-10 DIAGNOSIS — R41844 Frontal lobe and executive function deficit: Secondary | ICD-10-CM | POA: Diagnosis not present

## 2016-06-10 DIAGNOSIS — I69954 Hemiplegia and hemiparesis following unspecified cerebrovascular disease affecting left non-dominant side: Secondary | ICD-10-CM | POA: Diagnosis not present

## 2016-06-10 DIAGNOSIS — R29818 Other symptoms and signs involving the nervous system: Secondary | ICD-10-CM | POA: Diagnosis not present

## 2016-06-10 NOTE — Therapy (Signed)
[facility] [address] [place], [place], [address] Phone: [phone]   Fax:  [phone]  Speech Language Pathology Treatment  Patient Details  Name: [name] [name] MRN: [id number] Date of Birth: 05-16-1934 Referring Provider: Marvel Plan  Encounter Date: 06/10/2016      End of Session - 06/10/16 1513    Visit Number 3   Number of Visits 17   Date for SLP Re-Evaluation 08/09/16   SLP Start Time 1315   SLP Stop Time  1400   SLP Time Calculation (min) 45 min   Activity Tolerance Patient tolerated treatment well      Past Medical History:  Diagnosis Date  . Chronic combined systolic and diastolic CHF, NYHA class 3 (HCC)    a. 03/2013 Echo: EF 25-30%, diff HK, sev antsept HK, mild MR.  . Congestive dilated cardiomyopathy (HCC)    a. 03/2013 Echo: EF 25-30%;  b. 09/2013 s/p SJM 3242 CRT-P.  Marland Kitchen Coronary artery disease    a. 08/2012 Cath: LM nl, LAD 72m, LCX nondom, mild-mod nonobs dzs mid, OM1 ok, OM2 90/90p, RCA 40, EF 25-30%-->Med Rx.  . GERD (gastroesophageal reflux disease)   . High cholesterol   . Hypertension   . Hypothyroidism   . Left bundle branch block   . Osteoarthritis   . Stroke (HCC)   . Type II diabetes mellitus (HCC)     Past Surgical History:  Procedure Laterality Date  . ABDOMINAL HYSTERECTOMY  1980  . BI-VENTRICULAR PACEMAKER INSERTION N/A 10/01/2013   Procedure: BI-VENTRICULAR PACEMAKER INSERTION (CRT-P);  Surgeon: Duke Salvia, MD;  Location: [facility] CATH LAB;  Service: Cardiovascular;  Laterality: N/A;  . BI-VENTRICULAR PACEMAKER INSERTION (CRT-P)     a. 09/2013 s/p SJM 3242 CRT-P.  . Bilateral cateract surgery    . BMD  2006  . ORIF TIBIA PLATEAU Right 12/04/2013   Procedure: OPEN REDUCTION INTERNAL FIXATION (ORIF) TIBIAL PLATEAU;  Surgeon: Verlee Rossetti, MD;  Location: [facility] ORS;  Service: Orthopedics;  Laterality: Right;  . RIGHT HEART CATHETERIZATION N/A 09/08/2012   Procedure: RIGHT HEART  CATH;  Surgeon: Dolores Patty, MD;  Location: [facility] CATH LAB;  Service: Cardiovascular;  Laterality: N/A;    There were no vitals filed for this visit.      Subjective Assessment - 06/10/16 1510    Subjective "I haven't used the notebook."   Patient is accompained by: Family member  son   Currently in Pain? No/denies               ADULT SLP TREATMENT - 06/10/16 1510      General Information   Behavior/Cognition Alert;Cooperative     Treatment Provided   Treatment provided Cognitive-Linquistic     Pain Assessment   Pain Assessment No/denies pain     Cognitive-Linquistic Treatment   Treatment focused on Cognition   Skilled Treatment Focus this date on functional sequencing, recall and initiation. Pt required mod- max A to initiate recollection of a familiar recipe and consistent verbal cueing for each step. Discussed functional participation in cognitive-linguistic activities at home. Pt and son agreeable to implement.     Assessment / Recommendations / Plan   Plan Continue with current plan of care     Progression Toward Goals   Progression toward goals Progressing toward goals            SLP Short Term Goals - 06/10/16 1518      SLP SHORT TERM GOAL #1   Title  pt knowledge of memory book/organizer contents (homework, meds, calendar, schedule, errands, etc) when asked questions about these things, 100% of the time with rare min verbal cues   Status On-going     SLP SHORT TERM GOAL #2   Title pt will demo knowledge of where to look in memory book/organizer to find information on pertinent items (meds, schedule, calendar, errands, etc) with rare mod verbal cues   Status On-going     SLP SHORT TERM GOAL #3   Title pt will demo incr'd intellectual awareness by providing 2 safety precautions with occasional min questioning cues   Status On-going          SLP Long Term Goals - 06/06/16 1649      SLP LONG TERM GOAL #1   Title pt will demo knowledge of  infomration in memory book with rare min nonverbal cues over three sessions   Time 8   Period Weeks   Status New     SLP LONG TERM GOAL #2   Title pt will demo knowledge about where infomration is located in memory book with rare min verbal cues over three sessions    Time 8   Period Weeks   Status New     SLP LONG TERM GOAL #3   Title pt will demo verbal safety awareness when asked questions about daily activities, 90% of the time with rare min A over three sessions   Time 8   Period Weeks   Status New     SLP LONG TERM GOAL #4   Title pt or family will report pt using memory or cognitive compensations at home between 5 therapy sessions   Time 8   Period Weeks   Status New          Plan - 06/10/16 1514    Clinical Impression Statement Pt with very poor awareness of limitations. Focus on involvement in function cognitive-linguistic activities for carryover at home to decrease burden of care.   Speech Therapy Frequency 2x / week   Treatment/Interventions Compensatory techniques;Internal/external aids;SLP instruction and feedback;Patient/family education;Cueing hierarchy;Environmental controls;Cognitive reorganization   Potential to Achieve Goals Fair   Potential Considerations Severity of impairments      Patient will benefit from skilled therapeutic intervention in order to improve the following deficits and impairments:   Cognitive communication deficit    Problem List Patient Active Problem List   Diagnosis Date Noted  . Pain and swelling of toe of left foot 05/29/2016  . Cellulitis of toe of left foot 05/29/2016  . Diabetic neuropathy, painful (HCC) 04/24/2016  . Left carotid artery stenosis   . Hypertriglyceridemia, essential 01/10/2016  . 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 Type 2 diabetes mellitus with diabetic neuropathy, with long-term current use of insulin (HCC) 10/06/2015  . Osteoporosis,  unspecified 03/10/2014  . Left bundle branch block 07/01/2013  . Coronary atherosclerosis of native coronary artery 02/16/2013  . Chronic systolic heart failure (HCC) 08/16/2012  . Congestive dilated cardiomyopathy (HCC) 08/02/2012  . HTN (hypertension) 08/02/2012  . Familial tremor 11/29/2010  . VITAMIN D DEFICIENCY 04/04/2010  . Osteoarthrosis, unspecified whether generalized or localized, unspecified site 04/04/2010  . Hyperlipidemia with target LDL less than 70 01/19/2007  . Hypothyroidism 01/14/2007    Rocky Crafts MA, CCC-SLP 06/10/2016, 3:19 PM  [facility] [address] [place], [place], [address] Phone: [phone]   Fax:  [phone]   Name: AmeLie  Pollie MeyerFon[place] MRN: [id number] Date of Birth: 10/30/1933

## 2016-06-11 ENCOUNTER — Encounter: Payer: Medicare Other | Admitting: Occupational Therapy

## 2016-06-11 ENCOUNTER — Ambulatory Visit: Payer: Medicare Other | Admitting: Physical Therapy

## 2016-06-12 ENCOUNTER — Telehealth: Payer: Self-pay | Admitting: *Deleted

## 2016-06-12 NOTE — Telephone Encounter (Signed)
Got in touch with patient's son. He had not listened to message yet. Information gave about prehydration, lab results and to come in at 0900 on 06/17/16. He verbalized understanding.

## 2016-06-12 NOTE — Telephone Encounter (Signed)
-----   Message from Thurmon Fair, MD sent at 06/11/2016 11:48 AM EDT ----- Kidney tests are slightly abnormal, although better than they were 2 months ago. Her procedure scheduled for 2 PM and I would bring her earlier in the morning to get at least 4 hours of IV fluids before the procedure.

## 2016-06-12 NOTE — Telephone Encounter (Signed)
No answer. Left detailed message on son's voicemail, ok per dpr. Gave results of labs and instruction for the patient to arrive early at 0900 to [facility] hospital on 06/17/16 for the procedure for iv hydration. Asked for him to call back and confirm receipt of this message.

## 2016-06-13 ENCOUNTER — Ambulatory Visit: Payer: Medicare Other | Admitting: Physical Therapy

## 2016-06-13 ENCOUNTER — Encounter: Payer: Medicare Other | Admitting: Occupational Therapy

## 2016-06-13 ENCOUNTER — Ambulatory Visit: Payer: Medicare Other | Attending: Internal Medicine

## 2016-06-13 DIAGNOSIS — R41841 Cognitive communication deficit: Secondary | ICD-10-CM

## 2016-06-13 NOTE — Patient Instructions (Addendum)
    Homework: 1) Set up a binder/notebook (below) and BRING TO THERAPY next week.  2) Have Selena Batten or Dwayne come in the speech therapy room with you for as long as they can, when you come for speech therapy. We need one of them here each time, to talk about things that are happening at home. Thank you.  ========================================================= Glad to hear the basket by the door is working to keep track of where your pocketbook is!!  You told me you haven't gotten to use a notebook/binder to write down events that you did during the day or that happened during the day. Try to get one of those. Fill it with regular notebook paper, and divide the sheets into two sections, lengthwise so that two dates can fit on one page. Write down things you have done or write down things/errands you'd like to do on future dates. Now, you can recall doing things from previous days easier, and have a way to remember errands/events in the future.  You told me today that you don't mind your boys finding your medicines and giving them to you. That is fine. Just eliminate my suggestion to set alarms for you to remember to take your medications.  **WE talked today about having someone come and sit with you for that 90 minutes that you sometimes are by yourself during the day. You agreed you'd feel more comfortable with someone there.  I wonder if Selena Batten or Dwayne could set up someone, even a family member, to sit with you during that time. It only takes one accident for tragedy to strike! I'd hate if you couldn't respond quickly or appropriately and be a victim in that situation. **

## 2016-06-13 NOTE — Therapy (Signed)
[facility] [address] [place], [place], [address] Phone: [phone]   Fax:  [phone]  Speech Language Pathology Treatment  Patient Details  Name: [name] [name] MRN: [id number] Date of Birth: [birth year] Referring Provider: Marvel Plan  Encounter Date: 06/13/2016      End of Session - 06/13/16 1618    Visit Number 4   Number of Visits 17   Date for SLP Re-Evaluation 08/09/16   SLP Start Time 1535   SLP Stop Time  1612  no family here to review session with   SLP Time Calculation (min) 37 min   Activity Tolerance Patient tolerated treatment well      Past Medical History:  Diagnosis Date  . Chronic combined systolic and diastolic CHF, NYHA class 3 (HCC)    a. 03/2013 Echo: EF 25-30%, diff HK, sev antsept HK, mild MR.  . Congestive dilated cardiomyopathy (HCC)    a. 03/2013 Echo: EF 25-30%;  b. 09/2013 s/p SJM 3242 CRT-P.  Marland Kitchen Coronary artery disease    a. 08/2012 Cath: LM nl, LAD 13m, LCX nondom, mild-mod nonobs dzs mid, OM1 ok, OM2 90/90p, RCA 40, EF 25-30%-->Med Rx.  . GERD (gastroesophageal reflux disease)   . High cholesterol   . Hypertension   . Hypothyroidism   . Left bundle branch block   . Osteoarthritis   . Stroke (HCC)   . Type II diabetes mellitus (HCC)     Past Surgical History:  Procedure Laterality Date  . ABDOMINAL HYSTERECTOMY  1980  . BI-VENTRICULAR PACEMAKER INSERTION N/A 10/01/2013   Procedure: BI-VENTRICULAR PACEMAKER INSERTION (CRT-P);  Surgeon: Duke Salvia, MD;  Location: [facility] CATH LAB;  Service: Cardiovascular;  Laterality: N/A;  . BI-VENTRICULAR PACEMAKER INSERTION (CRT-P)     a. 09/2013 s/p SJM 3242 CRT-P.  . Bilateral cateract surgery    . BMD  2006  . ORIF TIBIA PLATEAU Right 12/04/2013   Procedure: OPEN REDUCTION INTERNAL FIXATION (ORIF) TIBIAL PLATEAU;  Surgeon: Verlee Rossetti, MD;  Location: [facility] ORS;  Service: Orthopedics;  Laterality: Right;  . RIGHT HEART CATHETERIZATION  N/A 09/08/2012   Procedure: RIGHT HEART CATH;  Surgeon: Dolores Patty, MD;  Location[facility] C[facility] LAB;  Service: Cardiovascular;  Laterality: N/A;    There were no vitals filed for this visit.      Subjective Assessment - 06/13/16 1549    Subjective Pt reports not having a notebook for daily events. Arrived alone and states Dwayne will pick her up.               ADULT SLP TREATMENT - 06/13/16 1549      General Information   Behavior/Cognition Alert;Cooperative     Treatment Provided   Treatment provided Cognitive-Linquistic     Pain Assessment   Pain Assessment No/denies pain     Cognitive-Linquistic Treatment   Treatment focused on Cognition   Skilled Treatment Since pt arrived alone, SLP reviewed with her SLP suggestions for memory compensation made during evaluation. Pt stated she is keeping her purse in a basket near the door. Pt reports she is not using a notebook/binder for recall of daily events/prospective events so SLP encouraged her to do so and re-educatedpt about how this could help with her memory. Lastly, SLP talked to pt about concern about leaving her alone for that 90 minutes occasionally during the week and suggested family or friend sit with pt for those times. (SLP also put much of this information on AVS). Pt stated  she does not mind her sons giving her meds to her and so SLP told pt to eliminate that recommendation from the recommendationl list made during eval.      Assessment / Recommendations / Plan   Plan Continue with current plan of care     Progression Toward Goals   Progression toward goals Not progressing toward goals (comment)          SLP Education - 06/13/16 1617    Education provided Yes   Education Details needed someone to sit with pt during the times during the week she would normally be alone   Person(s) Educated Patient   Methods Explanation;Handout   Comprehension Verbalized understanding;Need further instruction           SLP Short Term Goals - 06/13/16 1621      SLP SHORT TERM GOAL #1   Title pt knowledge of memory book/organizer contents (homework, meds, calendar, schedule, errands, etc) when asked questions about these things, 100% of the time with rare min verbal cues   Time 3   Period Weeks   Status On-going     SLP SHORT TERM GOAL #2   Title pt will demo knowledge of where to look in memory book/organizer to find information on pertinent items (meds, schedule, calendar, errands, etc) with rare mod verbal cues   Time 3   Period Weeks   Status On-going     SLP SHORT TERM GOAL #3   Title pt will demo incr'd intellectual awareness by providing 2 safety precautions with occasional min questioning cues   Time 3   Period Weeks   Status On-going          SLP Long Term Goals - 06/13/16 1622      SLP LONG TERM GOAL #1   Title pt will demo knowledge of infomration in memory book with rare min nonverbal cues over three sessions   Time 7   Period Weeks   Status On-going     SLP LONG TERM GOAL #2   Title pt will demo knowledge about where infomration is located in memory book with rare min verbal cues over three sessions    Time 7   Period Weeks   Status On-going     SLP LONG TERM GOAL #3   Title pt will demo verbal safety awareness when asked questions about daily activities, 90% of the time with rare min A over three sessions   Time 7   Period Weeks   Status On-going     SLP LONG TERM GOAL #4   Title pt or family will report pt using memory or cognitive compensations at home between 5 therapy sessions   Time 8   Period Weeks   Status New          Plan - 06/13/16 1619    Clinical Impression Statement Pt presents today with cont'd mod-severe cognitive linguistic deficits in memory and in intellectual awareness which result in likely decr in safety awareness and decr'd awareness of deficits. Pt has 24/7 care mostly, with sons although is alone occasionally during the week for 60-90  minute periods. SLP suggested to pt and put on after visit summary (AVS) that it might well be in patient's best interest to have a family member or friend sit with her during those times. Pt would benefit from skilled ST, with one or both sons accompanying pt, to assist family in developing home strategies to incr pt's independence and decr caregiver burden.  Speech Therapy Frequency 2x / week   Duration --  8 weeks   Treatment/Interventions Compensatory techniques;Internal/external aids;SLP instruction and feedback;Patient/family education;Cueing hierarchy;Environmental controls;Cognitive reorganization   Potential to Achieve Goals Fair   Potential Considerations Severity of impairments   Consulted and Agree with Plan of Care Patient;Family member/caregiver   Family Member Consulted son, Selena Batten      Patient will benefit from skilled therapeutic intervention in order to improve the following deficits and impairments:   Cognitive communication deficit    Problem List Patient Active Problem List   Diagnosis Date Noted  . Pain and swelling of toe of left foot 05/29/2016  . Cellulitis of toe of left foot 05/29/2016  . Diabetic neuropathy, painful (HCC) 04/24/2016  . Left carotid artery stenosis   . Hypertriglyceridemia, essential 01/10/2016  . 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 Type 2 diabetes mellitus with diabetic neuropathy, with long-term current use of insulin (HCC) 10/06/2015  . Osteoporosis, unspecified 03/10/2014  . Left bundle branch block 07/01/2013  . Coronary atherosclerosis of native coronary artery 02/16/2013  . Chronic systolic heart failure (HCC) 08/16/2012  . Congestive dilated cardiomyopathy (HCC) 08/02/2012  . HTN (hypertension) 08/02/2012  . Familial tremor 11/29/2010  . VITAMIN D DEFICIENCY 04/04/2010  . Osteoarthrosis, unspecified whether generalized or localized, unspecified site 04/04/2010  .  Hyperlipidemia with target LDL less than 70 01/19/2007  . Hypothyroidism 01/14/2007    [facility] ,MS, CCC-SLP  06/13/2016, 4:23 PM  [facility] [address] [place], [place], [address] Phone: [phone]   Fax:  [phone]   Name: [name] [name] MRN: [id number] Date of Birth: [birth year]

## 2016-06-17 ENCOUNTER — Ambulatory Visit (HOSPITAL_COMMUNITY)
Admission: RE | Admit: 2016-06-17 | Discharge: 2016-06-18 | Disposition: A | Discharge status: Home / Self Care | Payer: Medicare Other | Source: Ambulatory Visit | Attending: Cardiovascular Disease | Admitting: Cardiovascular Disease

## 2016-06-17 ENCOUNTER — Encounter (HOSPITAL_COMMUNITY)
Admission: RE | Disposition: A | Discharge status: Home / Self Care | Payer: Self-pay | Source: Ambulatory Visit | Attending: Cardiovascular Disease

## 2016-06-17 DIAGNOSIS — Z7982 Long term (current) use of aspirin: Secondary | ICD-10-CM | POA: Insufficient documentation

## 2016-06-17 DIAGNOSIS — E1152 Type 2 diabetes mellitus with diabetic peripheral angiopathy with gangrene: Secondary | ICD-10-CM | POA: Diagnosis not present

## 2016-06-17 DIAGNOSIS — I2510 Atherosclerotic heart disease of native coronary artery without angina pectoris: Secondary | ICD-10-CM | POA: Diagnosis not present

## 2016-06-17 DIAGNOSIS — Z794 Long term (current) use of insulin: Secondary | ICD-10-CM | POA: Diagnosis not present

## 2016-06-17 DIAGNOSIS — L03032 Cellulitis of left toe: Secondary | ICD-10-CM | POA: Diagnosis present

## 2016-06-17 DIAGNOSIS — I70229 Atherosclerosis of native arteries of extremities with rest pain, unspecified extremity: Secondary | ICD-10-CM | POA: Diagnosis present

## 2016-06-17 DIAGNOSIS — I5032 Chronic diastolic (congestive) heart failure: Secondary | ICD-10-CM | POA: Insufficient documentation

## 2016-06-17 DIAGNOSIS — I70262 Atherosclerosis of native arteries of extremities with gangrene, left leg: Secondary | ICD-10-CM | POA: Diagnosis not present

## 2016-06-17 DIAGNOSIS — Z7902 Long term (current) use of antithrombotics/antiplatelets: Secondary | ICD-10-CM | POA: Insufficient documentation

## 2016-06-17 DIAGNOSIS — E785 Hyperlipidemia, unspecified: Secondary | ICD-10-CM | POA: Insufficient documentation

## 2016-06-17 DIAGNOSIS — Z79899 Other long term (current) drug therapy: Secondary | ICD-10-CM | POA: Diagnosis not present

## 2016-06-17 DIAGNOSIS — Z87891 Personal history of nicotine dependence: Secondary | ICD-10-CM | POA: Insufficient documentation

## 2016-06-17 DIAGNOSIS — I110 Hypertensive heart disease with heart failure: Secondary | ICD-10-CM | POA: Insufficient documentation

## 2016-06-17 DIAGNOSIS — I998 Other disorder of circulatory system: Secondary | ICD-10-CM | POA: Diagnosis present

## 2016-06-17 HISTORY — PX: PERIPHERAL VASCULAR CATHETERIZATION: SHX172C

## 2016-06-17 LAB — POCT ACTIVATED CLOTTING TIME
ACTIVATED CLOTTING TIME: 224 s
ACTIVATED CLOTTING TIME: 241 s
ACTIVATED CLOTTING TIME: 186 s
ACTIVATED CLOTTING TIME: 197 s
ACTIVATED CLOTTING TIME: 164 s
Activated Clotting Time: 268 seconds

## 2016-06-17 LAB — GLUCOSE, CAPILLARY
GLUCOSE-CAPILLARY: 246 mg/dL — AB (ref 65–99)
GLUCOSE-CAPILLARY: 182 mg/dL — AB (ref 65–99)
GLUCOSE-CAPILLARY: 128 mg/dL — AB (ref 65–99)
GLUCOSE-CAPILLARY: 116 mg/dL — AB (ref 65–99)
Glucose-Capillary: 116 mg/dL — ABNORMAL HIGH (ref 65–99)

## 2016-06-17 SURGERY — LOWER EXTREMITY ANGIOGRAPHY
Anesthesia: LOCAL

## 2016-06-17 MED ORDER — ACETAMINOPHEN 325 MG PO TABS: 650.0000 mg | ORAL_TABLET | ORAL | Status: DC | PRN

## 2016-06-17 MED ORDER — HYDRALAZINE HCL 20 MG/ML IJ SOLN
INTRAMUSCULAR | Status: AC
Start: 2016-06-17 — End: 2016-06-17
  Filled 2016-06-17: qty 1

## 2016-06-17 MED ORDER — HYDRALAZINE HCL 20 MG/ML IJ SOLN
10.0000 mg | INTRAMUSCULAR | Status: DC | PRN
  Administered 2016-06-17 (×2): 10 mg via INTRAVENOUS
  Filled 2016-06-17: qty 1

## 2016-06-17 MED ORDER — FUROSEMIDE 20 MG PO TABS: 20.0000 mg | ORAL_TABLET | Freq: Every day | ORAL | Status: DC | PRN

## 2016-06-17 MED ORDER — HEPARIN (PORCINE) IN NACL 2-0.9 UNIT/ML-% IJ SOLN
INTRAMUSCULAR | Status: AC
  Filled 2016-06-17: qty 1000

## 2016-06-17 MED ORDER — NITROGLYCERIN IN D5W 200-5 MCG/ML-% IV SOLN
INTRAVENOUS | Status: AC
  Filled 2016-06-17: qty 250

## 2016-06-17 MED ORDER — LIDOCAINE HCL (PF) 1 % IJ SOLN
INTRAMUSCULAR | Status: AC
  Filled 2016-06-17: qty 30

## 2016-06-17 MED ORDER — ONDANSETRON HCL 4 MG/2ML IJ SOLN: 4.0000 mg | Freq: Four times a day (QID) | INTRAMUSCULAR | Status: DC | PRN

## 2016-06-17 MED ORDER — LIDOCAINE HCL (PF) 1 % IJ SOLN
INTRAMUSCULAR | Status: DC | PRN
  Administered 2016-06-17: 25 mL via INTRADERMAL

## 2016-06-17 MED ORDER — VERAPAMIL HCL 2.5 MG/ML IV SOLN
INTRAVENOUS | Status: AC
  Filled 2016-06-17: qty 2

## 2016-06-17 MED ORDER — ASPIRIN EC 81 MG PO TBEC
81.0000 mg | DELAYED_RELEASE_TABLET | Freq: Every day | ORAL | Status: DC
  Administered 2016-06-18: 81 mg via ORAL
  Filled 2016-06-17: qty 1

## 2016-06-17 MED ORDER — FENTANYL CITRATE (PF) 100 MCG/2ML IJ SOLN
INTRAMUSCULAR | Status: DC | PRN
  Administered 2016-06-17 (×2): 25 ug via INTRAVENOUS

## 2016-06-17 MED ORDER — HEPARIN (PORCINE) IN NACL 2-0.9 UNIT/ML-% IJ SOLN
INTRAMUSCULAR | Status: DC | PRN
  Administered 2016-06-17: 1000 mL via INTRA_ARTERIAL

## 2016-06-17 MED ORDER — MIDAZOLAM HCL 2 MG/2ML IJ SOLN
INTRAMUSCULAR | Status: DC | PRN
  Administered 2016-06-17: 0.5 mg via INTRAVENOUS

## 2016-06-17 MED ORDER — CLOPIDOGREL BISULFATE 75 MG PO TABS
75.0000 mg | ORAL_TABLET | Freq: Every day | ORAL | Status: DC
  Administered 2016-06-18: 75 mg via ORAL
  Filled 2016-06-17: qty 1

## 2016-06-17 MED ORDER — SODIUM CHLORIDE 0.9 % WEIGHT BASED INFUSION
3.0000 mL/kg/h | INTRAVENOUS | Status: DC
  Administered 2016-06-17: 3 mL/kg/h via INTRAVENOUS

## 2016-06-17 MED ORDER — NITROGLYCERIN 1 MG/10 ML FOR IR/CATH LAB
INTRA_ARTERIAL | Status: DC | PRN
  Administered 2016-06-17 (×4): 200 ug via INTRA_ARTERIAL

## 2016-06-17 MED ORDER — LEVOTHYROXINE SODIUM 112 MCG PO TABS
112.0000 ug | ORAL_TABLET | Freq: Every day | ORAL | Status: DC
  Administered 2016-06-18: 112 ug via ORAL
  Filled 2016-06-17: qty 1

## 2016-06-17 MED ORDER — HEPARIN SODIUM (PORCINE) 1000 UNIT/ML IJ SOLN
INTRAMUSCULAR | Status: AC
  Filled 2016-06-17: qty 1

## 2016-06-17 MED ORDER — IODIXANOL 320 MG/ML IV SOLN
INTRAVENOUS | Status: DC | PRN
  Administered 2016-06-17: 180 mL via INTRA_ARTERIAL

## 2016-06-17 MED ORDER — MIDAZOLAM HCL 2 MG/2ML IJ SOLN
INTRAMUSCULAR | Status: AC
  Filled 2016-06-17: qty 2

## 2016-06-17 MED ORDER — SODIUM CHLORIDE 0.9% FLUSH: 3.0000 mL | INTRAVENOUS | Status: DC | PRN

## 2016-06-17 MED ORDER — PANTOPRAZOLE SODIUM 40 MG PO TBEC
40.0000 mg | DELAYED_RELEASE_TABLET | Freq: Every day | ORAL | Status: DC
  Administered 2016-06-18: 40 mg via ORAL
  Filled 2016-06-17: qty 1

## 2016-06-17 MED ORDER — LISINOPRIL 5 MG PO TABS
5.0000 mg | ORAL_TABLET | Freq: Two times a day (BID) | ORAL | Status: DC
  Administered 2016-06-17 – 2016-06-18 (×2): 5 mg via ORAL
  Filled 2016-06-17 (×2): qty 1

## 2016-06-17 MED ORDER — CARVEDILOL 12.5 MG PO TABS
12.5000 mg | ORAL_TABLET | Freq: Two times a day (BID) | ORAL | Status: DC
  Administered 2016-06-18: 12.5 mg via ORAL
  Filled 2016-06-17: qty 1

## 2016-06-17 MED ORDER — SODIUM CHLORIDE 0.9 % WEIGHT BASED INFUSION: 1.0000 mL/kg/h | INTRAVENOUS | Status: DC

## 2016-06-17 MED ORDER — ROSUVASTATIN CALCIUM 20 MG PO TABS
20.0000 mg | ORAL_TABLET | Freq: Every day | ORAL | Status: DC
  Administered 2016-06-18: 20 mg via ORAL
  Filled 2016-06-17: qty 1

## 2016-06-17 MED ORDER — ASPIRIN 81 MG PO CHEW: 81.0000 mg | CHEWABLE_TABLET | ORAL | Status: DC

## 2016-06-17 MED ORDER — HEPARIN SODIUM (PORCINE) 1000 UNIT/ML IJ SOLN
INTRAMUSCULAR | Status: DC | PRN
  Administered 2016-06-17: 5000 [IU] via INTRAVENOUS
  Administered 2016-06-17: 2000 [IU] via INTRAVENOUS

## 2016-06-17 MED ORDER — GLIPIZIDE 10 MG PO TABS
10.0000 mg | ORAL_TABLET | Freq: Every day | ORAL | Status: DC
  Administered 2016-06-18: 10 mg via ORAL
  Filled 2016-06-17 (×2): qty 1

## 2016-06-17 MED ORDER — IOPAMIDOL (ISOVUE-370) INJECTION 76%
INTRAVENOUS | Status: AC
  Filled 2016-06-17: qty 100

## 2016-06-17 MED ORDER — INSULIN GLARGINE 100 UNIT/ML ~~LOC~~ SOLN
18.0000 [IU] | Freq: Every morning | SUBCUTANEOUS | Status: DC
  Filled 2016-06-17: qty 0.18

## 2016-06-17 MED ORDER — ATROPINE SULFATE 1 MG/10ML IJ SOSY
PREFILLED_SYRINGE | INTRAMUSCULAR | Status: AC
  Filled 2016-06-17: qty 10

## 2016-06-17 MED ORDER — HYDROCODONE-ACETAMINOPHEN 5-325 MG PO TABS: 1.0000 | ORAL_TABLET | Freq: Four times a day (QID) | ORAL | Status: DC | PRN

## 2016-06-17 MED ORDER — FENTANYL CITRATE (PF) 100 MCG/2ML IJ SOLN
INTRAMUSCULAR | Status: AC
  Filled 2016-06-17: qty 2

## 2016-06-17 MED ORDER — SODIUM CHLORIDE 0.45 % IV SOLN
INTRAVENOUS | Status: AC
  Administered 2016-06-17: 20:00:00 via INTRAVENOUS

## 2016-06-17 MED ORDER — MORPHINE SULFATE (PF) 2 MG/ML IV SOLN: 2.0000 mg | INTRAVENOUS | Status: DC | PRN

## 2016-06-17 MED ORDER — COENZYME Q10 30 MG PO CAPS: 30.0000 mg | ORAL_CAPSULE | Freq: Every day | ORAL | Status: DC

## 2016-06-17 SURGICAL SUPPLY — 31 items
BAG SNAP BAND KOVER 36X36 (MISCELLANEOUS) ×1 IMPLANT
BALLN COYOTE OTW 4X120X150 (BALLOONS) ×3
BALLN IN.PACT DCB 5X120 (BALLOONS) ×3
BALLOON COYOTE OTW 4X120X150 (BALLOONS) IMPLANT
CATH ANGIO 5F PIGTAIL 65CM (CATHETERS) ×1 IMPLANT
CATH CXI SUPP ST 2.6FR 150CM (CATHETERS) ×1 IMPLANT
CATH SOFTOUCH MOTARJEME 5F (CATHETERS) ×1 IMPLANT
CATH SOS OMNI O 5F 80CM (CATHETERS) ×1 IMPLANT
DCB IN.PACT 5X120 (BALLOONS) IMPLANT
DEVICE CONTINUOUS FLUSH (MISCELLANEOUS) ×1 IMPLANT
DEVICE EMBOSHIELD NAV6 2.5-4.8 (WIRE) ×1 IMPLANT
DIAMONDBACK SOLID OAS 1.5MM (CATHETERS) ×3
DIAMONDBACK SOLID OAS 2.0MM (CATHETERS) ×3
KIT ENCORE 26 ADVANTAGE (KITS) ×1 IMPLANT
KIT PV (KITS) ×3 IMPLANT
LUBRICANT VIPERSLIDE CORONARY (MISCELLANEOUS) ×1 IMPLANT
SHEATH HIGHFLEX ANSEL 7FR 55CM (SHEATH) ×1 IMPLANT
SHEATH PINNACLE 5F 10CM (SHEATH) ×1 IMPLANT
SHEATH PINNACLE 7F 10CM (SHEATH) ×1 IMPLANT
SHEATH PINNACLE MP 7F 45CM (SHEATH) ×1 IMPLANT
STOPCOCK MORSE 400PSI 3WAY (MISCELLANEOUS) ×1 IMPLANT
SYR MEDRAD MARK V 150ML (SYRINGE) ×3 IMPLANT
SYSTEM DIMNDBCK SLD OAS 1.5MM (CATHETERS) IMPLANT
SYSTEM DIMNDBCK SLD OAS 2.0MM (CATHETERS) IMPLANT
TRANSDUCER W/STOPCOCK (MISCELLANEOUS) ×3 IMPLANT
TRAY PV CATH (CUSTOM PROCEDURE TRAY) ×3 IMPLANT
TUBING CIL FLEX 10 FLL-RA (TUBING) ×1 IMPLANT
WIRE HITORQ VERSACORE ST 145CM (WIRE) ×1 IMPLANT
WIRE ROSEN-J .035X180CM (WIRE) ×1 IMPLANT
WIRE SPARTACORE .014X300CM (WIRE) ×1 IMPLANT
WIRE VIPER ADVANCE .017X335CM (WIRE) ×1 IMPLANT

## 2016-06-17 NOTE — Interval H&P Note (Signed)
History and Physical Interval Note:  06/17/2016 3:56 PM  [name] [name]  has presented today for surgery, with the diagnosis of pvd  The various methods of treatment have been discussed with the patient and family. After consideration of risks, benefits and other options for treatment, the patient has consented to  Procedure(s): Lower Extremity Angiography (N/A) as a surgical intervention .  The patient's history has been reviewed, patient examined, no change in status, stable for surgery.  I have reviewed the patient's chart and labs.  Questions were answered to the patient's satisfaction.     Nanetta Batty

## 2016-06-17 NOTE — H&P (View-Only) (Signed)
06/07/2016 Carlton AdamEmma Erhart   09/11/1933  [id number]  Primary Physician Sanda Lingerhomas Jones, MD Primary Cardiologist: Runell GessJonathan J Aliviana Burdell MD Roseanne RenoFACP, FACC, FAHA, FSCAI  HPI:  Mrs. [name] [name] is a delightful 82 year old thin and fit frail-appearing. African-American female mother of 4, grandmother 7 grandchildren accompanied by her son came today. She was referred by her podiatrist, Dr. Leary Rocaegel for peripheral vascular evaluation because of critical limb ischemia at a gangrenous left second toe. She sees Dr. Dian SituBensomohn  for diastolic heart failure and noncritical CAD. She does have a history of diabetes, hyperlipidemia, diastolic heart failure and noncritical CAD by cath January 2014. She's had a blackened left second toe for last 2 months that has become progressively gangrenous. Lower extremity arterial Doppler studies performed 06/05/16 revealed a right ABI 0.62 and a left of 0.46. She does have a high-frequency signal in her left popliteal artery suggesting high-grade disease as well as 1 vessel runoff via peroneal. She will need percutaneous revascularization for limb salvage.   Current Outpatient Prescriptions  Medication Sig Dispense Refill  . amoxicillin-clavulanate (AUGMENTIN) 875-125 MG tablet Take 1 tablet by mouth 2 (two) times daily. 28 tablet 1  . aspirin 325 MG tablet Take 1 tablet (325 mg total) by mouth daily. 30 tablet 1  . aspirin EC 81 MG tablet Take 81 mg by mouth daily.    . carvedilol (COREG) 12.5 MG tablet TAKE 1 TABLET TWICE A DAY WITH MEALS 180 tablet 5  . carvedilol (COREG) 6.25 MG tablet Take 6.25 mg by mouth 2 (two) times daily with a meal.    . clopidogrel (PLAVIX) 75 MG tablet Take 1 tablet (75 mg total) by mouth daily. 90 tablet 2  . co-enzyme Q-10 30 MG capsule Take 1 capsule (30 mg total) by mouth daily. 30 capsule 2  . furosemide (LASIX) 20 MG tablet Take 20 mg by mouth daily as needed for fluid.     Marland Kitchen. glipiZIDE (GLUCOTROL) 10 MG tablet TAKE 1 TABLET TWICE A DAY BEFORE MEALS  180 tablet 1  . HYDROcodone-acetaminophen (NORCO/VICODIN) 5-325 MG tablet Take 1 tablet by mouth every 6 (six) hours as needed for moderate pain. 75 tablet 0  . hydrOXYzine (ATARAX/VISTARIL) 25 MG tablet Take 1 tablet (25 mg total) by mouth 3 (three) times daily as needed. 60 tablet 3  . Insulin Glargine (LANTUS SOLOSTAR) 100 UNIT/ML Solostar Pen Inject 18 Units into the skin every morning. 15 mL 10  . LANTUS SOLOSTAR 100 UNIT/ML Solostar Pen INJECT 14 UNITS SUBCUTANEOUSLY BEFORE BREAKFAST 15 mL 1  . levothyroxine (SYNTHROID, LEVOTHROID) 112 MCG tablet Take 1 tablet (112 mcg total) by mouth daily. 90 tablet 3  . lisinopril (PRINIVIL,ZESTRIL) 5 MG tablet Take 1 tablet (5 mg total) by mouth 2 (two) times daily. 180 tablet 3  . Omega-3 Fatty Acids (FISH OIL PO) Take by mouth.    Marland Kitchen. omeprazole (PRILOSEC) 20 MG capsule Take 20 mg by mouth daily as needed (for acid reflux/heartburn.).     Marland Kitchen. rosuvastatin (CRESTOR) 20 MG tablet Take 1 tablet (20 mg total) by mouth daily. 90 tablet 2  . Vitamin D, Ergocalciferol, (DRISDOL) 50000 units CAPS capsule TAKE 1 CAPSULE EVERY 7 DAYS ON MONDAYS 12 capsule 0   No current facility-administered medications for this visit.     Allergies  Allergen Reactions  . Statins Other (See Comments)    Leg cramping  . Metformin And Related     Pt stopped it due to diarrhea  . Vytorin [Ezetimibe-Simvastatin] Other (See Comments)  Leg cramps    Social History   Social History  . Marital status: Divorced    Spouse name: N/A  . Number of children: 4  . Years of education: N/A   Occupational History  . Retired Engineer, civil (consulting)    Social History Main Topics  . Smoking status: Former Games developer  . Smokeless tobacco: Former Neurosurgeon  . Alcohol use No  . Drug use: No  . Sexual activity: Not Currently   Other Topics Concern  . Not on file   Social History Narrative   Widowed.  Lives with her son.  Normally able to ambulate without assistance.  Quit smoking as a teenager.                 Review of Systems: General: negative for chills, fever, night sweats or weight changes.  Cardiovascular: negative for chest pain, dyspnea on exertion, edema, orthopnea, palpitations, paroxysmal nocturnal dyspnea or shortness of breath Dermatological: negative for rash Respiratory: negative for cough or wheezing Urologic: negative for hematuria Abdominal: negative for nausea, vomiting, diarrhea, bright red blood per rectum, melena, or hematemesis Neurologic: negative for visual changes, syncope, or dizziness All other systems reviewed and are otherwise negative except as noted above.    Blood pressure 100/68, pulse 74, height 5\' 5"  (1.651 m), weight 123 lb (55.8 kg).  General appearance: alert and no distress Neck: no adenopathy, no carotid bruit, no JVD, supple, symmetrical, trachea midline and thyroid not enlarged, symmetric, no tenderness/mass/nodules Lungs: clear to auscultation bilaterally Heart: regular rate and rhythm, S1, S2 normal, no murmur, click, rub or gallop Extremities: extremities normal, atraumatic, no cyanosis or edema and Gangrenous left second toe  EKG not performed today  ASSESSMENT AND PLAN:   Pain and swelling of toe of left foot Mrs. Fabris was referred by Dr. Leary Roca for a gangrenous left second toe which has been present for the last 2 months. She has a history of long-standing diabetes, hypertension and hyperlipidemia. She did have diastolic heart failure with noncritical CAD by heart cath. Her EF has improved. Over the last 2 months she's had a progressive leak gangrenous left second toe although she denies claudication. Dopplers performed office 06/05/16 revealed a right ABI 0.62 and a left of 0.46 with a high-frequency signal in her left popliteal artery and one vessel runoff via peroneal. I believe that this will progress unless she has percutaneous revascularization of her popliteal artery at least if not one of her tibial arteries to the foot.  Otherwise, I suspect that she will potentially lose her left leg below the knee. I will arrange angiography and intervention.      Runell Gess MD FACP,FACC,FAHA, [facility] 06/07/2016 11:31 AM

## 2016-06-18 ENCOUNTER — Other Ambulatory Visit: Payer: Self-pay | Admitting: Cardiovascular Disease

## 2016-06-18 ENCOUNTER — Other Ambulatory Visit: Payer: Self-pay | Admitting: Cardiology

## 2016-06-18 ENCOUNTER — Encounter: Payer: Medicare Other | Admitting: Occupational Therapy

## 2016-06-18 ENCOUNTER — Ambulatory Visit: Payer: Medicare Other | Admitting: Physical Therapy

## 2016-06-18 ENCOUNTER — Encounter (HOSPITAL_COMMUNITY): Payer: Self-pay

## 2016-06-18 DIAGNOSIS — I5032 Chronic diastolic (congestive) heart failure: Secondary | ICD-10-CM | POA: Diagnosis not present

## 2016-06-18 DIAGNOSIS — I998 Other disorder of circulatory system: Secondary | ICD-10-CM

## 2016-06-18 DIAGNOSIS — I739 Peripheral vascular disease, unspecified: Secondary | ICD-10-CM

## 2016-06-18 DIAGNOSIS — E785 Hyperlipidemia, unspecified: Secondary | ICD-10-CM | POA: Diagnosis not present

## 2016-06-18 DIAGNOSIS — E1152 Type 2 diabetes mellitus with diabetic peripheral angiopathy with gangrene: Secondary | ICD-10-CM | POA: Diagnosis not present

## 2016-06-18 DIAGNOSIS — I70262 Atherosclerosis of native arteries of extremities with gangrene, left leg: Secondary | ICD-10-CM | POA: Diagnosis not present

## 2016-06-18 DIAGNOSIS — I110 Hypertensive heart disease with heart failure: Secondary | ICD-10-CM | POA: Diagnosis not present

## 2016-06-18 DIAGNOSIS — I2510 Atherosclerotic heart disease of native coronary artery without angina pectoris: Secondary | ICD-10-CM | POA: Diagnosis not present

## 2016-06-18 LAB — BASIC METABOLIC PANEL
ANION GAP: 8 (ref 5–15)
BUN: 17 mg/dL (ref 6–20)
CALCIUM: 8.8 mg/dL — AB (ref 8.9–10.3)
CHLORIDE: 106 mmol/L (ref 101–111)
CO2: 22 mmol/L (ref 22–32)
Creatinine, Ser: 0.99 mg/dL (ref 0.44–1.00)
GFR calc Af Amer: >60 mL/min (ref >60)
GFR calc non Af Amer: 52 mL/min — ABNORMAL LOW (ref >60)
GLUCOSE: 240 mg/dL — AB (ref 65–99)
Potassium: 3.6 mmol/L (ref 3.5–5.1)
Sodium: 136 mmol/L (ref 135–145)

## 2016-06-18 LAB — CBC
HCT: 31.4 % — ABNORMAL LOW (ref 36.0–46.0)
HEMOGLOBIN: 10.3 g/dL — AB (ref 12.0–15.0)
MCH: 24.9 pg — AB (ref 26.0–34.0)
MCHC: 32.8 g/dL (ref 30.0–36.0)
MCV: 75.8 fL — AB (ref 78.0–100.0)
Platelets: 187 10*3/uL (ref 150–400)
RBC: 4.14 MIL/uL (ref 3.87–5.11)
RDW: 15.5 % (ref 11.5–15.5)
WBC: 7.9 10*3/uL (ref 4.0–10.5)

## 2016-06-18 LAB — MRSA PCR SCREENING: MRSA BY PCR: NEGATIVE

## 2016-06-18 MED ORDER — PANTOPRAZOLE SODIUM 40 MG PO TBEC: 40.0000 mg | DELAYED_RELEASE_TABLET | Freq: Every day | ORAL | 6 refills | Status: DC

## 2016-06-18 NOTE — Discharge Summary (Signed)
Discharge Summary    Patient ID: [name] [name],  MRN: [id number], DOB/AGE: 1934-06-25 82 y.o.  Admit date: 06/17/2016 Discharge date: 06/18/2016  Primary Care Provider: Sanda Linger Primary Cardiologist: Dr. Allyson Sabal  Discharge Diagnoses    Active Problems:   Cellulitis of toe of left foot   Critical lower limb ischemia   Allergies Allergies  Allergen Reactions  . Statins Other (See Comments)    Leg cramping  . Metformin And Related     Pt stopped it due to diarrhea  . Vytorin [Ezetimibe-Simvastatin] Other (See Comments)    Leg cramps    Diagnostic Studies/Procedures    Lower extremity angiography: 06/17/16  Final Impression: Successful diamondback orbital rotational atherectomy with NAV 6 distal protection in the peroneal artery which was the only vessel runoff followed by drug eluding balloon angioplasty in the setting of medical limb ischemia with a gangrenous left second toe. The patient was already on aspirin and Plavix. [name] [name]'ll be hydrated overnight and discharged home in the morning. The sheath will be removed and pressure held once the ACT falls below 170. We will get follow-up lower extremity arterial Doppler studies and her [place] office next week and [name] [name]'ll be seen the following week by the level provider to assess for any changes from her radiation dose. It should be noted that [name] [name] had radiation dose above the upper limit of normal. I will then see her back 2-3 weeks thereafter. _____________   History of Present Illness     [name] [name] is a delightful 82 year old thin and fit frail-appearing. African-American female mother of 4, grandmother 7 grandchildren. [name] [name] was referred by her podiatrist, Dr. Leary Roca for peripheral vascular evaluation because of critical limb ischemia at a gangrenous left second toe. [name] [name] sees Dr. Dian Situ  for diastolic heart failure and noncritical CAD. [name] [name] does have a history of diabetes, hyperlipidemia, diastolic heart failure and  noncritical CAD by cath January 2014. [name] [name]'s had a blackened left second toe for last 2 months that has become progressively gangrenous. Lower extremity arterial Doppler studies performed 06/05/16 revealed a right ABI 0.62 and a left of 0.46. [name] [name] does have a high-frequency signal in her left popliteal artery suggesting high-grade disease as well as 1 vessel runoff via peroneal. [name] [name] will need percutaneous revascularization for limb salvage that will be arranged as outpatient.   Hospital Course     Consultants: None  [name] [name] underwent lower extremity angiography yesterday with Dr. Allyson Sabal with successful diamondback orbital rotational atherectomy with NAV 6 distal protection in the peroneal artery which was the only vessel runoff followed by drug eluding balloon angioplasty in the setting of medical limb ischemia with a gangrenous left second toe. [name] [name] was already on ASA and Plavix prior to this admission and will be maintained on the same.   [name] [name] was admitted overnight without noted complications, and labs the following morning showed stable Cr. [name] [name] was seen and assessed by Dr. Myrtis Ser and determined stable for discharge home. I have arranged for follow up dopplers in the office next week and messaged to have a follow up appt following her dopplers with Dr. Allyson Sabal.   Physical Exam:  General appearance: alert and no distress Neck: no adenopathy, no carotid bruit, no JVD, supple, symmetrical, trachea midline and thyroid not enlarged, symmetric, no tenderness/mass/nodules Lungs: clear to auscultation bilaterally Heart: regular rate and rhythm, S1, S2 normal, no murmur, click, rub or gallop Extremities: extremities normal, atraumatic, no cyanosis or edema and Gangrenous left second toe, right femoral cath  site stable without bruising or hematoma.  _____________  Discharge Vitals Blood pressure (!) 149/53, pulse 65, temperature 98.8 F (37.1 C), temperature source Oral, resp. rate 15, height 5\' 5"  (1.651 m), weight  130 lb (59 kg), SpO2 98 %.  Filed Weights   06/17/16 0854  Weight: 130 lb (59 kg)    Labs & Radiologic Studies    CBC  Recent Labs  06/18/16 0346  WBC 7.9  HGB 10.3*  HCT 31.4*  MCV 75.8*  PLT 187   Basic Metabolic Panel  Recent Labs  06/18/16 0346  NA 136  K 3.6  CL 106  CO2 22  GLUCOSE 240*  BUN 17  CREATININE 0.99  CALCIUM 8.8*   Liver Function Tests No results for input(s): AST, ALT, ALKPHOS, BILITOT, PROT, ALBUMIN in the last 72 hours. No results for input(s): LIPASE, AMYLASE in the last 72 hours. Cardiac Enzymes No results for input(s): CKTOTAL, CKMB, CKMBINDEX, TROPONINI in the last 72 hours. BNP Invalid input(s): POCBNP D-Dimer No results for input(s): DDIMER in the last 72 hours. Hemoglobin A1C No results for input(s): HGBA1C in the last 72 hours. Fasting Lipid Panel No results for input(s): CHOL, HDL, LDLCALC, TRIG, CHOLHDL, LDLDIRECT in the last 72 hours. Thyroid Function Tests No results for input(s): TSH, T4TOTAL, T3FREE, THYROIDAB in the last 72 hours.  Invalid input(s): FREET3 _____________  Varney Biles Foot Complete Left  Result Date: 05/29/2016 CLINICAL DATA:  Second toe pain and swelling for 2 weeks, no known injury, initial encounter EXAM: LEFT FOOT - COMPLETE 3+ VIEW COMPARISON:  None. FINDINGS: No acute fracture or dislocation is noted. On the lateral view there is some suggestion of bony erosion at the tip of the second distal phalanx. Some regularity adjacent soft tissue is noted. Correlation with any focal skin wound is recommended. No other focal abnormality is seen. IMPRESSION: Changes suggestive of erosion in the tip of the second distal phalanx. Correlate with any overlying skin wound. This may represent some early changes of osteomyelitis. These results will be called to the ordering clinician or representative by the Radiologist Assistant, and communication documented in the PACS or zVision Dashboard. Electronically Signed   By: Alcide Clever  M.D.   On: 05/29/2016 14:51   Disposition   Pt is being discharged home today in good condition.  Follow-up Plans & Appointments    Follow-up Information    [facility] [address] Follow up on 06/28/2016.   Specialty:  Cardiology Why:  at 2:30pm for your follow up doppler studies.  Contact information: [address] [id number] [place] [address] [phone]       Nanetta Batty, MD Follow up.   Specialties:  Cardiology, Radiology Why:  The office will call you with a follow up appt for after your doppler studies are done. Please give [place] a call if you have not received a phone call within 2-3 days.  Contact information: [address] [place] [address] [phone]          Discharge Instructions    Call MD for:  redness, tenderness, or signs of infection (pain, swelling, redness, odor or green/yellow discharge around incision site)    Complete by:  As directed    Diet - low sodium heart healthy    Complete by:  As directed    Discharge instructions    Complete by:  As directed    Groin Site Care Refer to this sheet in the next few weeks.  These instructions provide you with information on caring for yourself after your procedure. Your caregiver may also give you more specific instructions. Your treatment has been planned according to current medical practices, but problems sometimes occur. Call your caregiver if you have any problems or questions after your procedure. HOME CARE INSTRUCTIONS You may shower 24 hours after the procedure. Remove the bandage (dressing) and gently wash the site with plain soap and water. Gently pat the site dry.  Do not apply powder or lotion to the site.  Do not sit in a bathtub, swimming pool, or whirlpool for 5 to 7 days.  No bending, squatting, or lifting anything over 10 pounds (4.5 kg) as directed by your caregiver.  Inspect the site at least twice daily.  Do not  drive home if you are discharged the same day of the procedure. Have someone else drive you.  You may drive 24 hours after the procedure unless otherwise instructed by your caregiver.  What to expect: Any bruising will usually fade within 1 to 2 weeks.  Blood that collects in the tissue (hematoma) may be painful to the touch. It should usually decrease in size and tenderness within 1 to 2 weeks.  SEEK IMMEDIATE MEDICAL CARE IF: You have unusual pain at the groin site or down the affected leg.  You have redness, warmth, swelling, or pain at the groin site.  You have drainage (other than a small amount of blood on the dressing).  You have chills.  You have a fever or persistent symptoms for more than 72 hours.  You have a fever and your symptoms suddenly get worse.  Your leg becomes pale, cool, tingly, or numb.  You have heavy bleeding from the site. Hold pressure on the site. .   Increase activity slowly    Complete by:  As directed       Discharge Medications   Current Discharge Medication List    START taking these medications   Details  pantoprazole (PROTONIX) 40 MG tablet Take 1 tablet (40 mg total) by mouth daily. Qty: 30 tablet, Refills: 6      CONTINUE these medications which have NOT CHANGED   Details  aspirin EC 81 MG tablet Take 81 mg by mouth daily.    carvedilol (COREG) 12.5 MG tablet TAKE 1 TABLET TWICE A DAY WITH MEALS Qty: 180 tablet, Refills: 5    clopidogrel (PLAVIX) 75 MG tablet Take 1 tablet (75 mg total) by mouth daily. Qty: 90 tablet, Refills: 2    co-enzyme Q-10 30 MG capsule Take 1 capsule (30 mg total) by mouth daily. Qty: 30 capsule, Refills: 2    glipiZIDE (GLUCOTROL) 10 MG tablet TAKE 1 TABLET TWICE A DAY BEFORE MEALS Qty: 180 tablet, Refills: 1   Associated Diagnoses: Type 2 diabetes mellitus with other diabetic neurological complication; Type 2 diabetes mellitus with diabetic neuropathy, with long-term current use of insulin (HCC)      HYDROcodone-acetaminophen (NORCO/VICODIN) 5-325 MG tablet Take 1 tablet by mouth every 6 (six) hours as needed for moderate pain. Qty: 75 tablet, Refills: 0   Associated Diagnoses: Diabetic neuropathy, painful (HCC)    Insulin Glargine (LANTUS SOLOSTAR) 100 UNIT/ML Solostar Pen Inject 18 Units into the skin every morning. Qty: 15 mL, Refills: 10    levothyroxine (SYNTHROID, LEVOTHROID) 112 MCG tablet Take 1 tablet (112 mcg total) by mouth daily. Qty: 90 tablet, Refills: 3   Associated Diagnoses: Hypothyroidism, unspecified type    lisinopril (PRINIVIL,ZESTRIL) 5 MG tablet Take  1 tablet (5 mg total) by mouth 2 (two) times daily. Qty: 180 tablet, Refills: 3    Omega-3 Fatty Acids (FISH OIL PO) Take 1 capsule by mouth daily.     rosuvastatin (CRESTOR) 20 MG tablet Take 1 tablet (20 mg total) by mouth daily. Qty: 90 tablet, Refills: 2    Vitamin D, Ergocalciferol, (DRISDOL) 50000 units CAPS capsule TAKE 1 CAPSULE EVERY 7 DAYS ON MONDAYS Qty: 12 capsule, Refills: 0    furosemide (LASIX) 20 MG tablet Take 20 mg by mouth daily as needed for fluid.     hydrOXYzine (ATARAX/VISTARIL) 25 MG tablet Take 1 tablet (25 mg total) by mouth 3 (three) times daily as needed. Qty: 60 tablet, Refills: 3   Associated Diagnoses: Itching      STOP taking these medications     omeprazole (PRILOSEC) 20 MG capsule           Outstanding Labs/Studies   none  Duration of Discharge Encounter   Greater than 30 minutes including physician time.  Signed, Laverda Page NP-C 06/18/2016, 9:20 AM  Patient seen and examined. I agree with the assessment and plan as detailed above. See also my additional thoughts below.   The patient is stable. I made the decision for discharge. I agree with the note in plans as outlined carefully above. Careful follow-up has been arranged.  [name], MD, [facility] 06/18/2016 9:44 AM

## 2016-06-18 NOTE — Progress Notes (Signed)
Right femoral sheath removed. Pressure held for 20 minutes. Vital signs stable, patient tolerated well. Site level 0. No signs of bleeding, hematoma, or bruising.   Patient educated on post-sheath care and precautions. Patient will remain on bed rest for 4 hours starting now per order.   Will continue to monitor per protocol.

## 2016-06-19 MED FILL — Verapamil HCl IV Soln 2.5 MG/ML: INTRAVENOUS | Qty: 2 | Status: AC

## 2016-06-19 MED FILL — Nitroglycerin IV Soln 200 MCG/ML in D5W: INTRAVENOUS | Qty: 250 | Status: AC

## 2016-06-20 ENCOUNTER — Ambulatory Visit: Payer: Medicare Other | Admitting: Physical Therapy

## 2016-06-20 ENCOUNTER — Encounter: Payer: Medicare Other | Admitting: Occupational Therapy

## 2016-06-24 ENCOUNTER — Ambulatory Visit: Payer: Medicare Other

## 2016-06-24 DIAGNOSIS — R41841 Cognitive communication deficit: Secondary | ICD-10-CM

## 2016-06-24 NOTE — Therapy (Signed)
[facility] [address] [place], [place], [address] Phone: [phone]   Fax:  [phone]  Speech Language Pathology Treatment  Patient Details  Name: [name] [name] MRN: [id number] Date of Birth: 1934-02-08 Referring Provider: Rosalin Hawking  Encounter Date: 06/24/2016      End of Session - 06/24/16 1418    Visit Number 5   Number of Visits 17   Date for SLP Re-Evaluation 08/09/16   SLP Start Time 9211   SLP Stop Time  1350  day of d/c   SLP Time Calculation (min) 33 min      Past Medical History:  Diagnosis Date  . Chronic combined systolic and diastolic CHF, NYHA class 3 ([place])    a. 03/2013 Echo: EF 25-30%, diff HK, sev antsept HK, mild MR.  . Congestive dilated cardiomyopathy ([place])    a. 03/2013 Echo: EF 25-30%;  b. 09/2013 s/p SJM 3242 CRT-P.  Marland Kitchen Coronary artery disease    a. 08/2012 Cath: LM nl, LAD 25m LCX nondom, mild-mod nonobs dzs mid, OM1 ok, OM2 90/90p, RCA 40, EF 25-30%-->Med Rx.  . GERD (gastroesophageal reflux disease)   . High cholesterol   . Hypertension   . Hypothyroidism   . Left bundle branch block   . Osteoarthritis   . Stroke ([place]   . Type II diabetes mellitus ([place]     Past Surgical History:  Procedure Laterality Date  . ABDOMINAL HYSTERECTOMY  1980  . BI-VENTRICULAR PACEMAKER INSERTION N/A 10/01/2013   Procedure: BI-VENTRICULAR PACEMAKER INSERTION (CRT-P);  Surgeon: SDeboraha Sprang MD;  [facility];  Service: Cardiovascular;  Laterality: N/A;  . BI-VENTRICULAR PACEMAKER INSERTION (CRT-P)     a. 09/2013 s/p SJM 3242 CRT-P.  . Bilateral cateract surgery    . BMD  2006  . ORIF TIBIA PLATEAU Right 12/04/2013   Procedure: OPEN REDUCTION INTERNAL FIXATION (ORIF) TIBIAL PLATEAU;  Surgeon: SAugustin Schooling MD;  Location: [facility];  Service: Orthopedics;  Laterality: Right;  . PERIPHERAL VASCULAR CATHETERIZATION N/A 06/17/2016   Procedure: Lower Extremity Angiography;  Surgeon: JLorretta Harp MD;  [facility];  Service: Cardiovascular;  Laterality: N/A;  . PERIPHERAL VASCULAR CATHETERIZATION Left 06/17/2016   Procedure: Peripheral Vascular Atherectomy;  Surgeon: JLorretta Harp MD;  [facility];  Service: Cardiovascular;  Laterality: Left;  Popliteal   . RIGHT HEART CATHETERIZATION N/A 09/08/2012   Procedure: RIGHT HEART CATH;  Surgeon: DJolaine Artist MD;  [facility];  Service: Cardiovascular;  Laterality: N/A;    There were no vitals filed for this visit.      Subjective Assessment - 06/24/16 1328    Currently in Pain? No/denies               ADULT SLP TREATMENT - 06/24/16 1321      General Information   Behavior/Cognition Alert;Cooperative     Treatment Provided   Treatment provided Cognitive-Linquistic     Cognitive-Linquistic Treatment   Treatment focused on Cognition   Skilled Treatment Pt arrives without memory binder/notebook. SLP stopped KMaudie Mercuryfrom leaving and asked him to come in to ST session (he said, "I'll be back for you," when SLP came to get pt in lobby). [name] willingly attended first 20 minutes ST with pt, and described family as continuing to try to find things that would be going to pt to work with for cooking- like timers, sticky notes, etc, as she has tried cooking when  neither of her sons is present. Maudie Mercury reports she puts something on the stove and does not recall anything is on stove until she smells it burning.  He and pt report further assistance is not needed with a memory binder/notebook at this time, as family is attempting this at home. Pt states she is fine with sons tracking appointments for her. Given the pt's burning things on the stove, SLP reiterated what was put on pt's AVS last week re: having someone at the house between the times pt's sons are able to provide care, and told pt and son Maudie Mercury that SLP recommends someone be at pt's home to sit with pt. Maudie Mercury said that was possible, and  demo's understanding.      Assessment / Recommendations / Plan   Plan Discharge SLP treatment due to (comment)  pt/family request     Progression Toward Goals   Progression toward goals --  see goal update - d/c today          SLP Education - 06/24/16 1417    Education Details someone to sit iwth pt during "shift change" times with sons of pt.   Person(s) Educated Patient;Child(ren)   Methods Explanation   Comprehension Verbalized understanding          SLP Short Term Goals - 06/24/16 1421      SLP SHORT TERM GOAL #1   Title pt knowledge of memory book/organizer contents (homework, meds, calendar, schedule, errands, etc) when asked questions about these things, 100% of the time with rare min verbal cues   Time 3   Period Weeks   Status Not Met     SLP SHORT TERM GOAL #2   Title pt will demo knowledge of where to look in memory book/organizer to find information on pertinent items (meds, schedule, calendar, errands, etc) with rare mod verbal cues   Time 3   Period Weeks   Status Not Met     SLP SHORT TERM GOAL #3   Title pt will demo incr'd intellectual awareness by providing 2 safety precautions with occasional min questioning cues   Time 3   Period Weeks   Status Not Met          SLP Long Term Goals - 06/24/16 1331      SLP LONG TERM GOAL #1   Title pt will demo knowledge of infomration in memory book with rare min nonverbal cues over three sessions   Time 7   Period Weeks   Status Not Met     SLP LONG TERM GOAL #2   Title pt will demo knowledge about where infomration is located in memory book with rare min verbal cues over three sessions    Time 7   Period Weeks   Status Not Met     SLP LONG TERM GOAL #3   Title pt will demo verbal safety awareness when asked questions about daily activities, 90% of the time with rare min A over three sessions   Time 7   Period Weeks   Status Not Met     SLP LONG TERM GOAL #4   Title pt or family will report pt  using memory or cognitive compensations at home between 5 therapy sessions   Baseline 06-13-16   Time 7   Period Weeks   Status Partially Met          Plan - 06/24/16 1418    Clinical Impression Statement Pt presents today with cont'd mod-severe cognitive linguistic  deficits in memory and in intellectual awareness which result in likely decr in safety awareness and decr'd awareness of deficits. Pt has 24/7 care mostly, with sons although is alone occasionally during the week for 60-90 minute periods. SLP reiterated to pt and her son today that it might well be in patient's best interest to have a family member or friend sit with her during those times. Pt's son had not planned on attending tx today but was willing to do so when SLP asked him to attend. Pt's son stated family was comfortable with memory compensation development and implementation. Pt was asked if today would be best day for d/c and she answered affirmatively.    Treatment/Interventions Compensatory techniques;Internal/external aids;SLP instruction and feedback;Patient/family education;Cueing hierarchy;Environmental controls;Cognitive reorganization   Potential to Achieve Goals Fair   Potential Considerations Severity of impairments   Consulted and Agree with Plan of Care Patient;Family member/caregiver   Family Member Consulted son, Maudie Mercury      Patient will benefit from skilled therapeutic intervention in order to improve the following deficits and impairments:   Cognitive communication deficit      G-Codes - July 16, 2016 1422    Functional Assessment Tool Used noms (level 4), clinical judgement- (60-65% impaired)   Functional Limitations Memory   Memory Goal Status (Y0737) At least 40 percent but less than 60 percent impaired, limited or restricted   Memory Discharge Status (T0626) At least 60 percent but less than 80 percent impaired, limited or restricted     [place]  Visits from Start of Care:  5  Current functional level related to goals / functional outcomes: See above goal update. Pt's family reports they are managing pt's memory compensations at this time and are comfortable doing so. Pt reports she is fine with sons managing appointments and meds.   Remaining deficits: Cognitive linguistic deficits including memory, awareness, and attention.   Education / Equipment: Memory compensations.   Plan: Patient agrees to discharge.  Patient goals were partially met. Patient is being discharged due to being pleased with the current functional level.        Problem List Patient Active Problem List   Diagnosis Date Noted  . Critical lower limb ischemia 06/17/2016  . Pain and swelling of toe of left foot 05/29/2016  . Cellulitis of toe of left foot 05/29/2016  . Diabetic neuropathy, painful ([place]) 04/24/2016  . Left carotid artery stenosis   . Hypertriglyceridemia, essential 01/10/2016  . 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 Type 2 diabetes mellitus with diabetic neuropathy, with long-term current use of insulin ([place]) 10/06/2015  . Osteoporosis, unspecified 03/10/2014  . Left bundle branch block 07/01/2013  . Coronary atherosclerosis of native coronary artery 02/16/2013  . Chronic systolic heart failure ([place]) 08/16/2012  . Congestive dilated cardiomyopathy ([place]) 08/02/2012  . HTN (hypertension) 08/02/2012  . Familial tremor 11/29/2010  . VITAMIN D DEFICIENCY 04/04/2010  . Osteoarthrosis, unspecified whether generalized or localized, unspecified site 04/04/2010  . Hyperlipidemia with target LDL less than 70 01/19/2007  . Hypothyroidism 01/14/2007    [facility] ,[place], [place]  07-16-2016, 2:23 PM  [facility] [address] [place], [place], [address] Phone: [phone]   Fax:  [phone]   Name: Clarabell Matsuoka MRN: [id number] Date of Birth:  Mar 18, 1934

## 2016-06-25 ENCOUNTER — Ambulatory Visit: Payer: Medicare Other | Admitting: Physical Therapy

## 2016-06-25 ENCOUNTER — Encounter: Payer: Medicare Other | Admitting: Occupational Therapy

## 2016-06-26 ENCOUNTER — Telehealth: Payer: Self-pay | Admitting: Cardiology

## 2016-06-26 ENCOUNTER — Encounter: Payer: Medicare Other | Admitting: *Deleted

## 2016-06-26 NOTE — Telephone Encounter (Signed)
Spoke with pt and reminded pt of remote transmission that is due today. Pt verbalized understanding.   

## 2016-06-27 ENCOUNTER — Encounter: Payer: Medicare Other | Admitting: Occupational Therapy

## 2016-06-27 ENCOUNTER — Ambulatory Visit: Payer: Medicare Other | Admitting: Physical Therapy

## 2016-06-27 ENCOUNTER — Other Ambulatory Visit: Payer: Self-pay | Admitting: Internal Medicine

## 2016-06-28 ENCOUNTER — Ambulatory Visit (HOSPITAL_COMMUNITY)
Admit: 2016-06-28 | Discharge: 2016-06-28 | Disposition: A | Discharge status: Home / Self Care | Payer: Medicare Other | Attending: Cardiovascular Disease | Admitting: Cardiovascular Disease

## 2016-06-28 DIAGNOSIS — E785 Hyperlipidemia, unspecified: Secondary | ICD-10-CM | POA: Diagnosis not present

## 2016-06-28 DIAGNOSIS — Z87891 Personal history of nicotine dependence: Secondary | ICD-10-CM | POA: Insufficient documentation

## 2016-06-28 DIAGNOSIS — Z9889 Other specified postprocedural states: Secondary | ICD-10-CM | POA: Diagnosis not present

## 2016-06-28 DIAGNOSIS — I739 Peripheral vascular disease, unspecified: Secondary | ICD-10-CM | POA: Diagnosis not present

## 2016-06-28 DIAGNOSIS — I2510 Atherosclerotic heart disease of native coronary artery without angina pectoris: Secondary | ICD-10-CM | POA: Insufficient documentation

## 2016-06-28 DIAGNOSIS — I10 Essential (primary) hypertension: Secondary | ICD-10-CM | POA: Diagnosis not present

## 2016-07-01 ENCOUNTER — Other Ambulatory Visit: Payer: Self-pay | Admitting: Cardiovascular Disease

## 2016-07-01 DIAGNOSIS — I998 Other disorder of circulatory system: Secondary | ICD-10-CM

## 2016-07-01 DIAGNOSIS — I70229 Atherosclerosis of native arteries of extremities with rest pain, unspecified extremity: Secondary | ICD-10-CM

## 2016-07-02 ENCOUNTER — Ambulatory Visit: Payer: Medicare Other | Admitting: Neurology

## 2016-07-02 ENCOUNTER — Encounter: Payer: Medicare Other | Admitting: Speech Pathology

## 2016-07-03 ENCOUNTER — Encounter: Payer: Self-pay | Admitting: Neurology

## 2016-07-16 ENCOUNTER — Encounter: Payer: Self-pay | Admitting: Cardiovascular Disease

## 2016-07-16 ENCOUNTER — Ambulatory Visit (INDEPENDENT_AMBULATORY_CARE_PROVIDER_SITE_OTHER): Payer: Medicare Other | Admitting: Cardiovascular Disease

## 2016-07-16 VITALS — BP 140/76 | HR 70 | Ht 65.0 in | Wt 129.0 lb

## 2016-07-16 DIAGNOSIS — I998 Other disorder of circulatory system: Secondary | ICD-10-CM | POA: Diagnosis not present

## 2016-07-16 DIAGNOSIS — I10 Essential (primary) hypertension: Secondary | ICD-10-CM

## 2016-07-16 DIAGNOSIS — I639 Cerebral infarction, unspecified: Secondary | ICD-10-CM

## 2016-07-16 DIAGNOSIS — I70229 Atherosclerosis of native arteries of extremities with rest pain, unspecified extremity: Secondary | ICD-10-CM

## 2016-07-16 NOTE — Patient Instructions (Signed)
Medication Instructions: Your physician recommends that you continue on your current medications as directed. Please refer to the Current Medication list given to you today.   Testing/Procedures: Your physician has requested that you have a lower extremity arterial duplex in May 2018. During this test, ultrasound is used to evaluate arterial blood flow in the legs. Allow one hour for this exam. There are no restrictions or special instructions.   Follow-Up: Your physician recommends that you schedule a follow-up appointment with Dr. Allyson Sabal after LE arterial dopplers.  If you need a refill on your cardiac medications before your next appointment, please call your pharmacy.

## 2016-07-16 NOTE — Progress Notes (Signed)
07/16/2016 [name] [name]   July 18, 1934  [id number]  Primary Physician Sanda Linger, MD Primary Cardiologist: Runell Gess MD [name] [name]  HPI:  .[name] [name] is a delightful 82 year old thin and fit frail-appearing. African-American female mother of 4, grandmother 7 grandchildren accompanied by her son came today. She was referred by her podiatrist, Dr. Charlsie Merles for peripheral vascular evaluation because of critical limb ischemia at a gangrenous left second toe. I last saw her in the office 06/07/16. She sees Dr. Dian Situ for diastolic heart failure and noncritical CAD. She does have a history of diabetes, hyperlipidemia, diastolic heart failure and noncritical CAD by cath January 2014. She's had a blackened left second toe for last 2 months that has become progressively gangrenous. Lower extremity arterial Doppler studies performed 06/05/16 revealed a right ABI 0.62 and a left of 0.46. She does have a high-frequency signal in her left popliteal artery suggesting high-grade disease as well as 1 vessel runoff via peroneal. She underwent peripheral angiography 06/17/16 revealing an occluded left popliteal artery with 1 vessel runoff via peroneal. I performed a diamondback orbital atherectomy followed by drug-eluting balloon angioplasty with excellent angiographic and clinical result. ABI improved from 0.46 up to 1.0. Her ischemic pain resolved and her left second toe is slowly improving in color.  Current Outpatient Prescriptions  Medication Sig Dispense Refill  . aspirin EC 81 MG tablet Take 81 mg by mouth daily.    . carvedilol (COREG) 12.5 MG tablet TAKE 1 TABLET TWICE A DAY WITH MEALS 180 tablet 5  . clopidogrel (PLAVIX) 75 MG tablet Take 1 tablet (75 mg total) by mouth daily. 90 tablet 2  . co-enzyme Q-10 30 MG capsule Take 1 capsule (30 mg total) by mouth daily. 30 capsule 2  . furosemide (LASIX) 20 MG tablet Take 20 mg by mouth daily as needed for fluid.     Marland Kitchen glipiZIDE  (GLUCOTROL) 10 MG tablet TAKE 1 TABLET TWICE A DAY BEFORE MEALS 180 tablet 1  . HYDROcodone-acetaminophen (NORCO/VICODIN) 5-325 MG tablet Take 1 tablet by mouth every 6 (six) hours as needed for moderate pain. 75 tablet 0  . hydrOXYzine (ATARAX/VISTARIL) 25 MG tablet Take 1 tablet (25 mg total) by mouth 3 (three) times daily as needed. (Patient taking differently: Take 25 mg by mouth 3 (three) times daily as needed for itching. ) 60 tablet 3  . Insulin Glargine (LANTUS SOLOSTAR) 100 UNIT/ML Solostar Pen Inject 18 Units into the skin every morning. 15 mL 10  . levothyroxine (SYNTHROID, LEVOTHROID) 112 MCG tablet Take 1 tablet (112 mcg total) by mouth daily. 90 tablet 3  . lisinopril (PRINIVIL,ZESTRIL) 5 MG tablet Take 1 tablet (5 mg total) by mouth 2 (two) times daily. 180 tablet 3  . Omega-3 Fatty Acids (FISH OIL PO) Take 1 capsule by mouth daily.     . pantoprazole (PROTONIX) 40 MG tablet Take 1 tablet (40 mg total) by mouth daily. 30 tablet 6  . rosuvastatin (CRESTOR) 20 MG tablet Take 1 tablet (20 mg total) by mouth daily. 90 tablet 2  . Vitamin D, Ergocalciferol, (DRISDOL) 50000 units CAPS capsule TAKE 1 CAPSULE EVERY 7 DAYS ON MONDAYS 12 capsule 0  . Vitamin D, Ergocalciferol, (DRISDOL) 50000 units CAPS capsule TAKE ONE CAPSULE BY MOUTH EVERY 7 DAYS ON MONDAYS 12 capsule 3   No current facility-administered medications for this visit.     Allergies  Allergen Reactions  . Statins Other (See Comments)    Leg cramping  .  Metformin And Related     Pt stopped it due to diarrhea  . Vytorin [Ezetimibe-Simvastatin] Other (See Comments)    Leg cramps    Social History   Social History  . Marital status: Divorced    Spouse name: N/A  . Number of children: 4  . Years of education: N/A   Occupational History  . Retired Engineer, civil (consulting)    Social History Main Topics  . Smoking status: Former Games developer  . Smokeless tobacco: Former Neurosurgeon  . Alcohol use No  . Drug use: No  . Sexual activity: Not  Currently   Other Topics Concern  . Not on file   Social History Narrative   Widowed.  Lives with her son.  Normally able to ambulate without assistance.  Quit smoking as a teenager.                Review of Systems: General: negative for chills, fever, night sweats or weight changes.  Cardiovascular: negative for chest pain, dyspnea on exertion, edema, orthopnea, palpitations, paroxysmal nocturnal dyspnea or shortness of breath Dermatological: negative for rash Respiratory: negative for cough or wheezing Urologic: negative for hematuria Abdominal: negative for nausea, vomiting, diarrhea, bright red blood per rectum, melena, or hematemesis Neurologic: negative for visual changes, syncope, or dizziness All other systems reviewed and are otherwise negative except as noted above.    Blood pressure 140/76, pulse 70, height 5\' 5"  (1.651 m), weight 129 lb (58.5 kg).  General appearance: alert and no distress Neck: no adenopathy, no carotid bruit, no JVD, supple, symmetrical, trachea midline and thyroid not enlarged, symmetric, no tenderness/mass/nodules Lungs: clear to auscultation bilaterally Heart: regular rate and rhythm, S1, S2 normal, no murmur, click, rub or gallop Extremities: extremities normal, atraumatic, no cyanosis or edema  EKG sinus rhythm at 70 with atrial sensed and ventricular paced rhythm. I personally reviewed this EKG  ASSESSMENT AND PLAN:   Critical lower limb ischemia Mrs. [name] [name] returns today for follow-up of critical limb ischemia post intervention. She is an 82 year old African-American female referred to me by Dr. Charlsie Merles , her podiatrist, for an ischemic left second toe and Dopplers that showed a left ABI 0.46 with 1 vessel runoff. I performed peripheral angiography on her 06/17/16 revealing a totally occluded left popliteal artery stenosis with 1 vessel runoff via peroneal. I performed a diamondback orbital rotation atherectomy followed by drug-eluting  dementia possibly with an excellent angiographic and clinical result. ABI improved up to 1.0 and her pain resolved. The ischemic appearing changes are slowly normalizing. She is on dual antiplatelet therapy. We will continue to monitor her noninvasively. I'm going to get lower extremity arterial Doppler studies on her in 6 months and I will see her back after that for further evaluation.      Runell Gess MD FACP,FACC,FAHA, [facility] 07/16/2016 2:16 PM

## 2016-07-16 NOTE — Assessment & Plan Note (Signed)
[name] [name] returns today for follow-up of critical limb ischemia post intervention. She is an 82 year old African-American female referred to me by Dr. Charlsie Merles , her podiatrist, for an ischemic left second toe and Dopplers that showed a left ABI 0.46 with 1 vessel runoff. I performed peripheral angiography on her 06/17/16 revealing a totally occluded left popliteal artery stenosis with 1 vessel runoff via peroneal. I performed a diamondback orbital rotation atherectomy followed by drug-eluting dementia possibly with an excellent angiographic and clinical result. ABI improved up to 1.0 and her pain resolved. The ischemic appearing changes are slowly normalizing. She is on dual antiplatelet therapy. We will continue to monitor her noninvasively. I'm going to get lower extremity arterial Doppler studies on her in 6 months and I will see her back after that for further evaluation.

## 2016-08-19 ENCOUNTER — Encounter: Payer: Self-pay | Admitting: Podiatry

## 2016-08-19 ENCOUNTER — Ambulatory Visit (INDEPENDENT_AMBULATORY_CARE_PROVIDER_SITE_OTHER): Payer: Medicare Other | Admitting: Podiatry

## 2016-08-19 DIAGNOSIS — M79609 Pain in unspecified limb: Secondary | ICD-10-CM | POA: Diagnosis not present

## 2016-08-19 DIAGNOSIS — B351 Tinea unguium: Secondary | ICD-10-CM | POA: Diagnosis not present

## 2016-08-19 DIAGNOSIS — Q828 Other specified congenital malformations of skin: Secondary | ICD-10-CM | POA: Diagnosis not present

## 2016-08-19 DIAGNOSIS — E1040 Type 1 diabetes mellitus with diabetic neuropathy, unspecified: Secondary | ICD-10-CM

## 2016-08-19 DIAGNOSIS — E1140 Type 2 diabetes mellitus with diabetic neuropathy, unspecified: Secondary | ICD-10-CM

## 2016-08-21 NOTE — Progress Notes (Signed)
Subjective:     Patient ID: [name] [name], female   DOB: 01/16/1934, 83 y.o.   MRN: [id number]  HPI long-term diabetic presents with significant nail disease 1-5 both feet with incurvation and lesion third digit left that's painful when pressed with patient having a history of issues with neurological sensation   Review of Systems     Objective:   Physical Exam Neurovascular status unchanged with diminished sharp [employer] vibratory and patient's noted to have significant thickness of nailbeds 1-5 both feet with yellow brittle discoloration and pain and distal keratotic lesion third left that's painful when pressed    Assessment:     At risk diabetic with mycotic nail infection bilateral and lesion third left    Plan:     Nail debridement accomplished 1-5 both feet and debridement of lesion left with no iatrogenic bleeding and patient will be seen back for routine care

## 2016-08-27 ENCOUNTER — Ambulatory Visit (INDEPENDENT_AMBULATORY_CARE_PROVIDER_SITE_OTHER): Payer: Medicare Other | Admitting: Neurology

## 2016-08-27 ENCOUNTER — Encounter: Payer: Self-pay | Admitting: Neurology

## 2016-08-27 VITALS — BP 113/66 | HR 71 | Wt 132.0 lb

## 2016-08-27 DIAGNOSIS — I63331 Cerebral infarction due to thrombosis of right posterior cerebral artery: Secondary | ICD-10-CM | POA: Diagnosis not present

## 2016-08-27 DIAGNOSIS — G3184 Mild cognitive impairment, so stated: Secondary | ICD-10-CM | POA: Diagnosis not present

## 2016-08-27 NOTE — Patient Instructions (Signed)
I had a long d/w patient and her son about her recent  Right posterior cerebral artery stroke, left sided visual field loss, post stroke cognitive impairment, risk for recurrent stroke/TIAs, personally independently reviewed imaging studies and stroke evaluation results and answered questions.Continue aspirin 325 mg daily  for secondary stroke prevention and maintain strict control of hypertension with blood pressure goal below 130/90, diabetes with hemoglobin A1c goal below 6.5% and lipids with LDL cholesterol goal below 70 mg/dL. I also advised the patient to eat a healthy diet with plenty of whole grains, cereals, fruits and vegetables, exercise regularly and maintain ideal body weight .patient was advised not to drive due to her persistent peripheral vision loss. We also discussed memory compensation strategies. Since her memory loss seems to be improving we will hold off on more definitive medications at the present time. Followup in the future with my nurse practitioner in 6 months or call earlier if necessary Management of Memory Problems  There are some general things you can do to help manage your memory problems.  Your memory may not in fact recover, but by using techniques and strategies you will be able to manage your memory difficulties better.  1)  Establish a routine.  Try to establish and then stick to a regular routine.  By doing this, you will get used to what to expect and you will reduce the need to rely on your memory.  Also, try to do things at the same time of day, such as taking your medication or checking your calendar first thing in the morning.  Think about think that you can do as a part of a regular routine and make a list.  Then enter them into a daily planner to remind you.  This will help you establish a routine.  2)  Organize your environment.  Organize your environment so that it is uncluttered.  Decrease visual stimulation.  Place everyday items such as keys or cell  phone in the same place every day (ie.  Basket next to front door)  Use post it notes with a brief message to yourself (ie. Turn off light, lock the door)  Use labels to indicate where things go (ie. Which cupboards are for food, dishes, etc.)  Keep a notepad and pen by the telephone to take messages  3)  Memory Aids  A diary or journal/notebook/daily planner  Making a list (shopping list, chore list, to do list that needs to be done)  Using an alarm as a reminder (kitchen timer or cell phone alarm)  Using cell phone to store information (Notes, Calendar, Reminders)  Calendar/White board placed in a prominent position  Post-it notes  In order for memory aids to be useful, you need to have good habits.  It's no good remembering to make a note in your journal if you don't remember to look in it.  Try setting aside a certain time of day to look in journal.  4)  Improving mood and managing fatigue.  There may be other factors that contribute to memory difficulties.  Factors, such as anxiety, depression and tiredness can affect memory.  Regular gentle exercise can help improve your mood and give you more energy.  Simple relaxation techniques may help relieve symptoms of anxiety  Try to get back to completing activities or hobbies you enjoyed doing in the past.  Learn to pace yourself through activities to decrease fatigue.  Find out about some local support groups where you can share experiences with  others.  Try and achieve 7-8 hours of sleep at night.  Stroke Prevention Some medical conditions and behaviors are associated with an increased chance of having a stroke. You may prevent a stroke by making healthy choices and managing medical conditions. How can I reduce my risk of having a stroke?  Stay physically active. Get at least 30 minutes of activity on most or all days.  Do not smoke. It may also be helpful to avoid exposure to secondhand smoke.  Limit alcohol use.  Moderate alcohol use is considered to be:  No more than 2 drinks per day for men.  No more than 1 drink per day for nonpregnant women.  Eat healthy foods. This involves:  Eating 5 or more servings of fruits and vegetables a day.  Making dietary changes that address high blood pressure (hypertension), high cholesterol, diabetes, or obesity.  Manage your cholesterol levels.  Making food choices that are high in fiber and low in saturated fat, trans fat, and cholesterol may control cholesterol levels.  Take any prescribed medicines to control cholesterol as directed by your health care provider.  Manage your diabetes.  Controlling your carbohydrate and sugar intake is recommended to manage diabetes.  Take any prescribed medicines to control diabetes as directed by your health care provider.  Control your hypertension.  Making food choices that are low in salt (sodium), saturated fat, trans fat, and cholesterol is recommended to manage hypertension.  Ask your health care provider if you need treatment to lower your blood pressure. Take any prescribed medicines to control hypertension as directed by your health care provider.  If you are 22-1 years of age, have your blood pressure checked every 3-5 years. If you are 67 years of age or older, have your blood pressure checked every year.  Maintain a healthy weight.  Reducing calorie intake and making food choices that are low in sodium, saturated fat, trans fat, and cholesterol are recommended to manage weight.  Stop drug abuse.  Avoid taking birth control pills.  Talk to your health care provider about the risks of taking birth control pills if you are over 16 years old, smoke, get migraines, or have ever had a blood clot.  Get evaluated for sleep disorders (sleep apnea).  Talk to your health care provider about getting a sleep evaluation if you snore a lot or have excessive sleepiness.  Take medicines only as directed by  your health care provider.  For some people, aspirin or blood thinners (anticoagulants) are helpful in reducing the risk of forming abnormal blood clots that can lead to stroke. If you have the irregular heart rhythm of atrial fibrillation, you should be on a blood thinner unless there is a good reason you cannot take them.  Understand all your medicine instructions.  Make sure that other conditions (such as anemia or atherosclerosis) are addressed. Get help right away if:  You have sudden weakness or numbness of the face, arm, or leg, especially on one side of the body.  Your face or eyelid droops to one side.  You have sudden confusion.  You have trouble speaking (aphasia) or understanding.  You have sudden trouble seeing in one or both eyes.  You have sudden trouble walking.  You have dizziness.  You have a loss of balance or coordination.  You have a sudden, severe headache with no known cause.  You have new chest pain or an irregular heartbeat. Any of these symptoms may represent a serious problem that is  an emergency. Do not wait to see if the symptoms will go away. Get medical help at once. Call your local emergency services (911 in [place] Do not drive yourself to the hospital.  This information is not intended to replace advice given to you by your health care provider. Make sure you discuss any questions you have with your health care provider. Document Released: 09/05/2004 Document Revised: 01/04/2016 Document Reviewed: 01/29/2013 Elsevier Interactive Patient Education  2017 [employer].

## 2016-08-28 NOTE — Progress Notes (Signed)
[facility] [address] [place]. [place] [address] [phone]       OFFICE FOLLOW-UP NOTE  [name]. [name] [name] Date of Birth:  1934/03/30 Medical Record Number:  [id number]   HPI: Initial visit 03/29/16 :[name] [name] is a 89 year pleasant African-American lady is seen today for the first office follow-up visit following hospital admission for stroke in May 2017. She is accompanied by her son. 83 y.o. female with medical history significant of hypothyroidism, hyperlipidemia, osteoarthritis, type 2 diabetes, CAD, left bundle branch block, chronic combined systolic and diastolic CHF, GERD who came to the emergency department due to recurrent dizziness episodes and unsteady gait for almost 2 weeks. She denies numbness or weakness, slurred speech or language articulation/comprehension difficulty. She describes this sensation as feeling drunk.. She presented outside time window for thrombolysis. MRI scan of the brain showed right posterior cerebral artery infarct and CT angiogram showed abrupt occlusion of the right posterior cerebral artery. There was a string sign and critical stenosis of the proximal left ICA at the origin as well as moderate to severe stenosis of both cavernous carotid arteries. Transthoracic echo showed normal ejection fraction. Hemoglobin A1c was significantly elevated at 11.2 and LDL cholesterol at 152 mg percent. Patient was started on aspirin and Plavix for stroke prevention given large vessel intracranial stenosis as well as on medications cholesterol and diabetes. Vascular surgery was consulted for asymptomatic carotid stenosis but given multivessel) extra cranial stenosis deferred treatment options to neuroradiology. Patient continues to have dense left-sided vision loss and walks with a cane. She is living at home. She has finished home physical occupational therapy and plans to start outpatient therapy next week. She does occasionally bump into objects in its  walls and has to be careful. She is tolerating aspirin and Plavix without bleeding and bruising so far. She did have significant short-term memory and cognitive difficulties initially but these seem to be gradually improving as per son. Patient has not yet seen Dr. Corliss Skains for discussion of asymptomatic carotid revascularization. Update 08/27/16 : She returns for followup after last visit 5 months ago. She continues to have poor peripheral vision on the left. She lives with her son and has 24-hour supervision at home. Her short-term memory seems to be improving. She however has reduced attention and ability to multi task. She has not been driving. Her blood pressure is well control She is tolerating aspirin and Plavix with only minor bruising and no bleeeding. She states her sugars are under good control but cannot tell me when they were last checked. ROS:   14 system review of systems is positive for   Memory loss, loss of vision,  , joint pain and all other systems negative  PMH:  Past Medical History:  Diagnosis Date  . Chronic combined systolic and diastolic CHF, NYHA class 3 (HCC)    a. 03/2013 Echo: EF 25-30%, diff HK, sev antsept HK, mild MR.  . Congestive dilated cardiomyopathy (HCC)    a. 03/2013 Echo: EF 25-30%;  b. 09/2013 s/p SJM 3242 CRT-P.  Marland Kitchen Coronary artery disease    a. 08/2012 Cath: LM nl, LAD 33m, LCX nondom, mild-mod nonobs dzs mid, OM1 ok, OM2 90/90p, RCA 40, EF 25-30%-->Med Rx.  . GERD (gastroesophageal reflux disease)   . High cholesterol   . Hypertension   . Hypothyroidism   . Left bundle branch block   . Osteoarthritis   . Stroke (HCC)   . Type II diabetes mellitus (HCC)  Social History:  Social History   Social History  . Marital status: Divorced    Spouse name: N/A  . Number of children: 4  . Years of education: N/A   Occupational History  . Retired Engineer, civil (consulting)    Social History Main Topics  . Smoking status: Former Games developer  . Smokeless tobacco: Former Neurosurgeon    . Alcohol use No  . Drug use: No  . Sexual activity: Not Currently   Other Topics Concern  . Not on file   Social History Narrative   Widowed.  Lives with her son.  Normally able to ambulate without assistance.  Quit smoking as a teenager.               Medications:   Current Outpatient Prescriptions on File Prior to Visit  Medication Sig Dispense Refill  . aspirin EC 81 MG tablet Take 81 mg by mouth daily.    . carvedilol (COREG) 12.5 MG tablet TAKE 1 TABLET TWICE A DAY WITH MEALS 180 tablet 5  . clopidogrel (PLAVIX) 75 MG tablet Take 1 tablet (75 mg total) by mouth daily. 90 tablet 2  . co-enzyme Q-10 30 MG capsule Take 1 capsule (30 mg total) by mouth daily. 30 capsule 2  . furosemide (LASIX) 20 MG tablet Take 20 mg by mouth daily as needed for fluid.     Marland Kitchen glipiZIDE (GLUCOTROL) 10 MG tablet TAKE 1 TABLET TWICE A DAY BEFORE MEALS 180 tablet 1  . HYDROcodone-acetaminophen (NORCO/VICODIN) 5-325 MG tablet Take 1 tablet by mouth every 6 (six) hours as needed for moderate pain. 75 tablet 0  . hydrOXYzine (ATARAX/VISTARIL) 25 MG tablet Take 1 tablet (25 mg total) by mouth 3 (three) times daily as needed. (Patient taking differently: Take 25 mg by mouth 3 (three) times daily as needed for itching. ) 60 tablet 3  . Insulin Glargine (LANTUS SOLOSTAR) 100 UNIT/ML Solostar Pen Inject 18 Units into the skin every morning. 15 mL 10  . levothyroxine (SYNTHROID, LEVOTHROID) 112 MCG tablet Take 1 tablet (112 mcg total) by mouth daily. 90 tablet 3  . lisinopril (PRINIVIL,ZESTRIL) 5 MG tablet Take 1 tablet (5 mg total) by mouth 2 (two) times daily. 180 tablet 3  . Omega-3 Fatty Acids (FISH OIL PO) Take 1 capsule by mouth daily.     . pantoprazole (PROTONIX) 40 MG tablet Take 1 tablet (40 mg total) by mouth daily. 30 tablet 6  . rosuvastatin (CRESTOR) 20 MG tablet Take 1 tablet (20 mg total) by mouth daily. 90 tablet 2  . Vitamin D, Ergocalciferol, (DRISDOL) 50000 units CAPS capsule TAKE 1 CAPSULE  EVERY 7 DAYS ON MONDAYS 12 capsule 0   No current facility-administered medications on file prior to visit.     Allergies:   Allergies  Allergen Reactions  . Statins Other (See Comments)    Leg cramping  . Metformin And Related     Pt stopped it due to diarrhea  . Vytorin [Ezetimibe-Simvastatin] Other (See Comments)    Leg cramps    Physical Exam General: Frail elderly African-American, seated, in no evident distress Head: head normocephalic and atraumatic.  Neck: supple with no carotid or supraclavicular bruits Cardiovascular: regular rate and rhythm, no murmurs Musculoskeletal: no deformity Skin:  no rash/petichiae Vascular:  Normal pulses all extremities Vitals:   08/27/16 1308  BP: 113/66  Pulse: 71   Neurologic Exam Mental Status: Awake and fully alert. Oriented to place and time. Recent and remote memory intact. Attention span, concentration and  fund of knowledge appropriate. Mood and affect appropriate. Her the diminished recall 0/3. Animal the main stem and only. Clock drawing 1/4. Cranial Nerves: Fundoscopic exam reveals sharp disc margins. Pupils equal, briskly reactive to light. Extraocular movements full without nystagmus. Visual fields show dense left homonymous hemianopsia l to confrontation. Hearing intact. Facial sensation intact. Face, tongue, palate moves normally and symmetrically.  Motor: Normal bulk and tone. Normal strength in all tested extremity muscles. Diminished fine finger movements on the left. Orbits right over left upper extremity. Sensory.: intact to touch ,pinprick .position and vibratory sensation.  Coordination: Rapid alternating movements normal in all extremities. Finger-to-nose and heel-to-shin performed accurately bilaterally. Gait and Station: Arises from chair without difficulty. Stance is normal. Gait demonstrates normal stride length but mild imbalance .  Reflexes: 1+ and symmetric. Toes downgoing.   NIHSS Rankin   3  ASSESSMENT: 7 year African-American lady with embolic right posterior cerebral artery infarct in May 2017 with asymptomatic severe left proximal carotid and bilateral cavernous carotid stenosis. Vascular risk factors of hypertension, hyperlipidemia, diabetes, congestive heart failure and large vessel cerebrovascular disease.Mild cognitive impairment which is stable.    PLAN: I had a long d/w patient and her son about her recent  Right posterior cerebral artery stroke, left sided visual field loss, post stroke cognitive impairment, risk for recurrent stroke/TIAs, personally independently reviewed imaging studies and stroke evaluation results and answered questions.Continue aspirin 325 mg daily  for secondary stroke prevention and maintain strict control of hypertension with blood pressure goal below 130/90, diabetes with hemoglobin A1c goal below 6.5% and lipids with LDL cholesterol goal below 70 mg/dL. I also advised the patient to eat a healthy diet with plenty of whole grains, cereals, fruits and vegetables, exercise regularly and maintain ideal body weight .patient was advised not to drive due to her persistent peripheral vision loss. We also discussed memory compensation strategies. Since her memory loss seems to be improving we will hold off on more definitive medications at the present time. Greater than 50%  time during this 25 minute visit boss spent on counseling and coordination of care about her stroke and answering questions.Followup in the future with my nurse practitioner in 6 months or call earlier if necessary Delia Heady, MD  [facility] [address] [place], [place] [address]  Phone [phone] Fax [phone] Note: This document was prepared with digital dictation and possible smart phrase technology. Any transcriptional errors that result from this process are unintentional

## 2016-08-29 ENCOUNTER — Other Ambulatory Visit: Payer: Medicare Other

## 2016-09-03 ENCOUNTER — Telehealth: Payer: Self-pay | Admitting: Neurology

## 2016-09-03 NOTE — Telephone Encounter (Signed)
Called and left message relaying to call me back to reschedule doppler. Office was closed dew to snow day.

## 2016-09-11 ENCOUNTER — Ambulatory Visit (INDEPENDENT_AMBULATORY_CARE_PROVIDER_SITE_OTHER): Payer: Medicare Other

## 2016-09-11 DIAGNOSIS — I63331 Cerebral infarction due to thrombosis of right posterior cerebral artery: Secondary | ICD-10-CM | POA: Diagnosis not present

## 2016-09-30 ENCOUNTER — Telehealth: Payer: Self-pay

## 2016-09-30 ENCOUNTER — Ambulatory Visit: Payer: Medicare Other | Admitting: Internal Medicine

## 2016-09-30 NOTE — Telephone Encounter (Signed)
LEft vm for [name] pts son on dpr form. LEft vm to call back about carotid ultrasound results.

## 2016-09-30 NOTE — Telephone Encounter (Signed)
-----   Message from Micki Riley, MD sent at 09/28/2016 10:31 PM EST ----- Kindly inform patient that carotid ultrasound shows 50% narrowing of carotid artery on left but no need for surgery for now. Medical management and follow up study in 6-12 months

## 2016-09-30 NOTE — Telephone Encounter (Signed)
Rn spoke with patients son Selena Batten on Dpr form. Rn stated patientcarotid ultrasound shows 50% narrowing of carotid artery on left but no need for surgery for now. Medical management and follow up study in 6-12 months.This is per Dr. Pearlean Brownie note. Rn stated her next appt is in 02/2017 and the doppler can be schedule in 03/2017. Pts son verbalized understanding.

## 2016-10-03 ENCOUNTER — Other Ambulatory Visit (HOSPITAL_COMMUNITY): Payer: Self-pay | Admitting: Interventional Radiology

## 2016-10-03 ENCOUNTER — Ambulatory Visit (INDEPENDENT_AMBULATORY_CARE_PROVIDER_SITE_OTHER): Payer: Medicare Other | Admitting: Internal Medicine

## 2016-10-03 ENCOUNTER — Other Ambulatory Visit (INDEPENDENT_AMBULATORY_CARE_PROVIDER_SITE_OTHER): Payer: Medicare Other

## 2016-10-03 ENCOUNTER — Encounter: Payer: Self-pay | Admitting: Internal Medicine

## 2016-10-03 VITALS — BP 110/70 | HR 65 | Temp 97.7°F | Ht 65.0 in | Wt 125.5 lb

## 2016-10-03 DIAGNOSIS — Z794 Long term (current) use of insulin: Secondary | ICD-10-CM

## 2016-10-03 DIAGNOSIS — E1140 Type 2 diabetes mellitus with diabetic neuropathy, unspecified: Secondary | ICD-10-CM | POA: Diagnosis not present

## 2016-10-03 DIAGNOSIS — I6529 Occlusion and stenosis of unspecified carotid artery: Secondary | ICD-10-CM

## 2016-10-03 DIAGNOSIS — E785 Hyperlipidemia, unspecified: Secondary | ICD-10-CM | POA: Diagnosis not present

## 2016-10-03 DIAGNOSIS — E039 Hypothyroidism, unspecified: Secondary | ICD-10-CM

## 2016-10-03 DIAGNOSIS — D539 Nutritional anemia, unspecified: Secondary | ICD-10-CM

## 2016-10-03 DIAGNOSIS — R7989 Other specified abnormal findings of blood chemistry: Secondary | ICD-10-CM | POA: Diagnosis not present

## 2016-10-03 DIAGNOSIS — I10 Essential (primary) hypertension: Secondary | ICD-10-CM | POA: Diagnosis not present

## 2016-10-03 DIAGNOSIS — I2510 Atherosclerotic heart disease of native coronary artery without angina pectoris: Secondary | ICD-10-CM | POA: Diagnosis not present

## 2016-10-03 LAB — CBC WITH DIFFERENTIAL/PLATELET
Basophils Absolute: 0 10*3/uL (ref 0.0–0.1)
Basophils Relative: 0.7 % (ref 0.0–3.0)
EOS ABS: 0.1 10*3/uL (ref 0.0–0.7)
Eosinophils Relative: 2 % (ref 0.0–5.0)
HCT: 39.5 % (ref 36.0–46.0)
HEMOGLOBIN: 12.7 g/dL (ref 12.0–15.0)
Lymphocytes Relative: 53.6 % — ABNORMAL HIGH (ref 12.0–46.0)
Lymphs Abs: 3.4 10*3/uL (ref 0.7–4.0)
MCHC: 32.1 g/dL (ref 30.0–36.0)
MCV: 78.9 fl (ref 78.0–100.0)
MONO ABS: 0.4 10*3/uL (ref 0.1–1.0)
Monocytes Relative: 6.7 % (ref 3.0–12.0)
Neutro Abs: 2.3 10*3/uL (ref 1.4–7.7)
Neutrophils Relative %: 37 % — ABNORMAL LOW (ref 43.0–77.0)
Platelets: 187 10*3/uL (ref 150.0–400.0)
RBC: 5.01 Mil/uL (ref 3.87–5.11)
RDW: 15.1 % (ref 11.5–15.5)
WBC: 6.3 10*3/uL (ref 4.0–10.5)

## 2016-10-03 LAB — LIPID PANEL
CHOLESTEROL: 135 mg/dL (ref 0–200)
HDL: 23.8 mg/dL — AB (ref >39.00)
NonHDL: 111.4
Total CHOL/HDL Ratio: 6
Triglycerides: 326 mg/dL — ABNORMAL HIGH (ref 0.0–149.0)
VLDL: 65.2 mg/dL — ABNORMAL HIGH (ref 0.0–40.0)

## 2016-10-03 LAB — COMPREHENSIVE METABOLIC PANEL
ALBUMIN: 4 g/dL (ref 3.5–5.2)
ALK PHOS: 94 U/L (ref 39–117)
ALT: 26 U/L (ref 0–35)
AST: 30 U/L (ref 0–37)
BUN: 42 mg/dL — ABNORMAL HIGH (ref 6–23)
CO2: 28 mEq/L (ref 19–32)
CREATININE: 1.85 mg/dL — AB (ref 0.40–1.20)
Calcium: 9.5 mg/dL (ref 8.4–10.5)
Chloride: 107 mEq/L (ref 96–112)
GFR: 33.7 mL/min — ABNORMAL LOW (ref >60.00)
Glucose, Bld: 251 mg/dL — ABNORMAL HIGH (ref 70–99)
POTASSIUM: 4.7 meq/L (ref 3.5–5.1)
SODIUM: 141 meq/L (ref 135–145)
TOTAL PROTEIN: 7.6 g/dL (ref 6.0–8.3)
Total Bilirubin: 0.3 mg/dL (ref 0.2–1.2)

## 2016-10-03 LAB — IBC PANEL
Iron: 45 ug/dL (ref 42–145)
Saturation Ratios: 13.3 % — ABNORMAL LOW (ref 20.0–50.0)
Transferrin: 242 mg/dL (ref 212.0–360.0)

## 2016-10-03 LAB — FERRITIN: FERRITIN: 49.6 ng/mL (ref 10.0–291.0)

## 2016-10-03 LAB — FOLATE: FOLATE: 19.8 ng/mL (ref >5.9)

## 2016-10-03 LAB — LDL CHOLESTEROL, DIRECT: LDL DIRECT: 49 mg/dL

## 2016-10-03 LAB — TSH

## 2016-10-03 LAB — VITAMIN B12: VITAMIN B 12: 393 pg/mL (ref 211–911)

## 2016-10-03 LAB — HEMOGLOBIN A1C: HEMOGLOBIN A1C: 12 % — AB (ref 4.6–6.5)

## 2016-10-03 NOTE — Patient Instructions (Signed)

## 2016-10-03 NOTE — Progress Notes (Signed)
Subjective:  Patient ID: [name] [name], female    DOB: September 30, 1933  Age: 83 y.o. MRN: [id number]  CC: Anemia; Hypertension; Hyperlipidemia; and Diabetes   HPI [name] [name] presents for f/up on multiple medical concerns. She complains of chronic fatigue, poor appetite, some weight loss, and feeling cold all the time.  Outpatient Medications Prior to Visit  Medication Sig Dispense Refill  . aspirin EC 81 MG tablet Take 81 mg by mouth daily.    . carvedilol (COREG) 12.5 MG tablet TAKE 1 TABLET TWICE A DAY WITH MEALS 180 tablet 5  . clopidogrel (PLAVIX) 75 MG tablet Take 1 tablet (75 mg total) by mouth daily. 90 tablet 2  . furosemide (LASIX) 20 MG tablet Take 20 mg by mouth daily as needed for fluid.     Marland Kitchen glipiZIDE (GLUCOTROL) 10 MG tablet TAKE 1 TABLET TWICE A DAY BEFORE MEALS 180 tablet 1  . HYDROcodone-acetaminophen (NORCO/VICODIN) 5-325 MG tablet Take 1 tablet by mouth every 6 (six) hours as needed for moderate pain. 75 tablet 0  . hydrOXYzine (ATARAX/VISTARIL) 25 MG tablet Take 1 tablet (25 mg total) by mouth 3 (three) times daily as needed. (Patient taking differently: Take 25 mg by mouth 3 (three) times daily as needed for itching. ) 60 tablet 3  . Insulin Glargine (LANTUS SOLOSTAR) 100 UNIT/ML Solostar Pen Inject 18 Units into the skin every morning. 15 mL 10  . lisinopril (PRINIVIL,ZESTRIL) 5 MG tablet Take 1 tablet (5 mg total) by mouth 2 (two) times daily. 180 tablet 3  . Omega-3 Fatty Acids (FISH OIL PO) Take 1 capsule by mouth daily.     . pantoprazole (PROTONIX) 40 MG tablet Take 1 tablet (40 mg total) by mouth daily. 30 tablet 6  . rosuvastatin (CRESTOR) 20 MG tablet Take 1 tablet (20 mg total) by mouth daily. 90 tablet 2  . Vitamin D, Ergocalciferol, (DRISDOL) 50000 units CAPS capsule TAKE 1 CAPSULE EVERY 7 DAYS ON MONDAYS 12 capsule 0  . levothyroxine (SYNTHROID, LEVOTHROID) 112 MCG tablet Take 1 tablet (112 mcg total) by mouth daily. 90 tablet 3  . co-enzyme Q-10 30 MG  capsule Take 1 capsule (30 mg total) by mouth daily. 30 capsule 2   No facility-administered medications prior to visit.     ROS Review of Systems  Constitutional: Positive for fatigue. Negative for appetite change, chills, diaphoresis, fever and unexpected weight change.  HENT: Positive for trouble swallowing. Negative for voice change.   Eyes: Negative for visual disturbance.  Respiratory: Negative for cough, chest tightness, shortness of breath and wheezing.   Cardiovascular: Negative for chest pain, palpitations and leg swelling.  Gastrointestinal: Negative for abdominal pain.  Endocrine: Positive for cold intolerance. Negative for heat intolerance.  Genitourinary: Negative.   Musculoskeletal: Negative.  Negative for back pain, joint swelling and myalgias.  Skin: Negative.   Allergic/Immunologic: Negative.   Neurological: Positive for weakness. Negative for dizziness, speech difficulty and light-headedness.  Hematological: Negative.  Negative for adenopathy. Does not bruise/bleed easily.  Psychiatric/Behavioral: Negative.     Objective:  BP 110/70 (BP Location: Left Arm, Patient Position: Sitting, Cuff Size: Normal)   Pulse 65   Temp 97.7 F (36.5 C) (Oral)   Ht 5\' 5"  (1.651 m)   Wt 125 lb 8 oz (56.9 kg)   SpO2 97%   BMI 20.88 kg/m   BP Readings from Last 3 Encounters:  10/03/16 110/70  08/27/16 113/66  07/16/16 140/76    Wt Readings from Last 3 Encounters:  10/03/16  125 lb 8 oz (56.9 kg)  08/27/16 132 lb (59.9 kg)  07/16/16 129 lb (58.5 kg)    Physical Exam  Constitutional: No distress.  HENT:  Mouth/Throat: Oropharynx is clear and moist. No oropharyngeal exudate.  Eyes: Conjunctivae are normal. Right eye exhibits no discharge. Left eye exhibits no discharge. No scleral icterus.  Neck: Normal range of motion. Neck supple. No JVD present. No tracheal deviation present. No thyromegaly present.  Cardiovascular: Normal rate, regular rhythm, normal heart sounds and  intact distal pulses.  Exam reveals no gallop and no friction rub.   No murmur heard. Pulmonary/Chest: Effort normal and breath sounds normal. No stridor. No respiratory distress. She has no wheezes. She has no rales. She exhibits no tenderness.  Abdominal: Soft. Bowel sounds are normal. She exhibits no distension and no mass. There is no tenderness. There is no rebound and no guarding.  Musculoskeletal: Normal range of motion. She exhibits no tenderness or deformity.  Lymphadenopathy:    She has no cervical adenopathy.  Neurological: She is alert.  Skin: Skin is warm and dry. No rash noted. She is not diaphoretic. No erythema. No pallor.  Psychiatric: She has a normal mood and affect. Her behavior is normal. Judgment and thought content normal.  Vitals reviewed.   Lab Results  Component Value Date   WBC 6.3 10/03/2016   HGB 12.7 10/03/2016   HCT 39.5 10/03/2016   PLT 187.0 10/03/2016   GLUCOSE 251 (H) 10/03/2016   CHOL 135 10/03/2016   TRIG 326.0 (H) 10/03/2016   HDL 23.80 (L) 10/03/2016   LDLDIRECT 49.0 10/03/2016   LDLCALC 152 (H) 01/11/2016   ALT 26 10/03/2016   AST 30 10/03/2016   NA 141 10/03/2016   K 4.7 10/03/2016   CL 107 10/03/2016   CREATININE 1.85 (H) 10/03/2016   BUN 42 (H) 10/03/2016   CO2 28 10/03/2016   TSH <0.01 (L) 10/03/2016   INR 1.0 06/07/2016   HGBA1C 12.0 (H) 10/03/2016   MICROALBUR 3.6 (H) 01/10/2016    No results found.  Assessment & Plan:   Vear was seen today for anemia, hypertension, hyperlipidemia and diabetes.  Diagnoses and all orders for this visit:  Acquired hypothyroidism- her TSH is very suppressed so I have lowered her T4 dose -     TSH; Future -     levothyroxine (SYNTHROID, LEVOTHROID) 75 MCG tablet; Take 1 tablet (75 mcg total) by mouth daily.  Essential hypertension- her BP is well controlled, renal fxn is stable -     Comprehensive metabolic panel; Future  Type 2 diabetes mellitus with diabetic neuropathy, with long-term  current use of insulin (HCC)- her A1C is up to 12%, will refer back to ENDO and ask foe help from [facility] -     Comprehensive metabolic panel; Future -     Hemoglobin A1c; Future -     Consult to [facility] Care Management -     Ambulatory referral to Endocrinology  Hyperlipidemia with target LDL less than 70- she has achieved her LDL goal and is doing well on the statin -     Lipid panel; Future  Atherosclerosis of native coronary artery of native heart without angina pectoris- she has had no recent episodes of angina, will treat risk factors -     Lipid panel; Future  Deficiency anemia- her H/H are normal now, vitamin levels are normal -     CBC with Differential/Platelet; Future -     Vitamin B12; Future -  Folate; Future -     Ferritin; Future -     IBC panel; Future   I have discontinued Ms. Kimm's co-enzyme Q-10 and levothyroxine. I am also having her start on levothyroxine. Additionally, I am having her maintain her carvedilol, furosemide, Insulin Glargine, lisinopril, Vitamin D (Ergocalciferol), glipiZIDE, Omega-3 Fatty Acids (FISH OIL PO), aspirin EC, rosuvastatin, clopidogrel, hydrOXYzine, HYDROcodone-acetaminophen, and pantoprazole.  Meds ordered this encounter  Medications  . levothyroxine (SYNTHROID, LEVOTHROID) 75 MCG tablet    Sig: Take 1 tablet (75 mcg total) by mouth daily.    Dispense:  90 tablet    Refill:  1     Follow-up: Return in about 4 months (around 01/31/2017).  Sanda Linger, MD

## 2016-10-03 NOTE — Progress Notes (Signed)
Pre visit review using our clinic review tool, if applicable. No additional management support is needed unless otherwise documented below in the visit note. 

## 2016-10-04 ENCOUNTER — Encounter: Payer: Self-pay | Admitting: Internal Medicine

## 2016-10-04 MED ORDER — LEVOTHYROXINE SODIUM 75 MCG PO TABS: 75.0000 ug | ORAL_TABLET | Freq: Every day | ORAL | 1 refills | Status: DC

## 2016-10-16 ENCOUNTER — Other Ambulatory Visit: Payer: Self-pay

## 2016-10-16 NOTE — Patient Outreach (Signed)
[facility]) Care Management  10/16/16  Telicia Outwater August 26, 1933 [id number]  New referral received from Dr. Sanda Linger.  Successful outreach completed with patient's son Aleacia Dietzen. He stated that he is currently not at home and patient does not have a healthcare POA.   RNCM provided overview of [facility] Care Management and advised calling to offer support to patient, but unable to share personal health information without obtaining permission from patient. Patient's son verbalized understanding and agreeable to scheduling a call when he and patient are together.   Plan: Outreach call scheduled for next week to talk with patient and her son, Selena Batten.  Turkey R. Goldman Birchall, RN, BSN, CCM [facility] Care Management Coordinator [phone]

## 2016-10-17 ENCOUNTER — Other Ambulatory Visit: Payer: Self-pay | Admitting: Student

## 2016-10-17 ENCOUNTER — Other Ambulatory Visit: Payer: Self-pay | Admitting: Radiology

## 2016-10-18 ENCOUNTER — Other Ambulatory Visit (HOSPITAL_COMMUNITY): Payer: Self-pay | Admitting: Interventional Radiology

## 2016-10-18 ENCOUNTER — Ambulatory Visit (HOSPITAL_COMMUNITY)
Admission: RE | Admit: 2016-10-18 | Discharge: 2016-10-18 | Disposition: A | Discharge status: Home / Self Care | Payer: Medicare Other | Source: Ambulatory Visit | Attending: Interventional Radiology | Admitting: Interventional Radiology

## 2016-10-18 ENCOUNTER — Encounter (HOSPITAL_COMMUNITY): Payer: Self-pay

## 2016-10-18 DIAGNOSIS — I2510 Atherosclerotic heart disease of native coronary artery without angina pectoris: Secondary | ICD-10-CM | POA: Diagnosis not present

## 2016-10-18 DIAGNOSIS — E039 Hypothyroidism, unspecified: Secondary | ICD-10-CM | POA: Insufficient documentation

## 2016-10-18 DIAGNOSIS — M1990 Unspecified osteoarthritis, unspecified site: Secondary | ICD-10-CM | POA: Diagnosis not present

## 2016-10-18 DIAGNOSIS — K219 Gastro-esophageal reflux disease without esophagitis: Secondary | ICD-10-CM | POA: Insufficient documentation

## 2016-10-18 DIAGNOSIS — I658 Occlusion and stenosis of other precerebral arteries: Secondary | ICD-10-CM | POA: Diagnosis not present

## 2016-10-18 DIAGNOSIS — Z833 Family history of diabetes mellitus: Secondary | ICD-10-CM | POA: Diagnosis not present

## 2016-10-18 DIAGNOSIS — I420 Dilated cardiomyopathy: Secondary | ICD-10-CM | POA: Diagnosis not present

## 2016-10-18 DIAGNOSIS — Z8249 Family history of ischemic heart disease and other diseases of the circulatory system: Secondary | ICD-10-CM | POA: Insufficient documentation

## 2016-10-18 DIAGNOSIS — I6522 Occlusion and stenosis of left carotid artery: Secondary | ICD-10-CM | POA: Insufficient documentation

## 2016-10-18 DIAGNOSIS — I708 Atherosclerosis of other arteries: Secondary | ICD-10-CM | POA: Insufficient documentation

## 2016-10-18 DIAGNOSIS — I5042 Chronic combined systolic (congestive) and diastolic (congestive) heart failure: Secondary | ICD-10-CM | POA: Diagnosis not present

## 2016-10-18 DIAGNOSIS — I110 Hypertensive heart disease with heart failure: Secondary | ICD-10-CM | POA: Diagnosis not present

## 2016-10-18 DIAGNOSIS — Z87891 Personal history of nicotine dependence: Secondary | ICD-10-CM | POA: Insufficient documentation

## 2016-10-18 DIAGNOSIS — Z950 Presence of cardiac pacemaker: Secondary | ICD-10-CM | POA: Insufficient documentation

## 2016-10-18 DIAGNOSIS — I447 Left bundle-branch block, unspecified: Secondary | ICD-10-CM | POA: Insufficient documentation

## 2016-10-18 DIAGNOSIS — I6601 Occlusion and stenosis of right middle cerebral artery: Secondary | ICD-10-CM | POA: Diagnosis not present

## 2016-10-18 DIAGNOSIS — Z8673 Personal history of transient ischemic attack (TIA), and cerebral infarction without residual deficits: Secondary | ICD-10-CM | POA: Diagnosis not present

## 2016-10-18 DIAGNOSIS — I6502 Occlusion and stenosis of left vertebral artery: Secondary | ICD-10-CM | POA: Diagnosis not present

## 2016-10-18 DIAGNOSIS — E7800 Pure hypercholesterolemia, unspecified: Secondary | ICD-10-CM | POA: Diagnosis not present

## 2016-10-18 DIAGNOSIS — I6529 Occlusion and stenosis of unspecified carotid artery: Secondary | ICD-10-CM

## 2016-10-18 DIAGNOSIS — E1151 Type 2 diabetes mellitus with diabetic peripheral angiopathy without gangrene: Secondary | ICD-10-CM | POA: Diagnosis not present

## 2016-10-18 HISTORY — PX: IR GENERIC HISTORICAL: IMG1180011

## 2016-10-18 LAB — BASIC METABOLIC PANEL
Anion gap: 7 (ref 5–15)
BUN: 21 mg/dL — AB (ref 6–20)
CHLORIDE: 106 mmol/L (ref 101–111)
CO2: 24 mmol/L (ref 22–32)
Calcium: 9.6 mg/dL (ref 8.9–10.3)
Creatinine, Ser: 1.35 mg/dL — ABNORMAL HIGH (ref 0.44–1.00)
GFR calc Af Amer: 42 mL/min — ABNORMAL LOW (ref >60)
GFR, EST NON AFRICAN AMERICAN: 36 mL/min — AB (ref >60)
Glucose, Bld: 209 mg/dL — ABNORMAL HIGH (ref 65–99)
POTASSIUM: 5.8 mmol/L — AB (ref 3.5–5.1)
SODIUM: 137 mmol/L (ref 135–145)

## 2016-10-18 LAB — POCT I-STAT, CHEM 8
BUN: 30 mg/dL — AB (ref 6–20)
CALCIUM ION: 1.26 mmol/L (ref 1.15–1.40)
CHLORIDE: 93 mmol/L — AB (ref 101–111)
Creatinine, Ser: 1.4 mg/dL — ABNORMAL HIGH (ref 0.44–1.00)
Glucose, Bld: 203 mg/dL — ABNORMAL HIGH (ref 65–99)
HCT: 40 % (ref 36.0–46.0)
Hemoglobin: 13.6 g/dL (ref 12.0–15.0)
POTASSIUM: 3.9 mmol/L (ref 3.5–5.1)
SODIUM: 141 mmol/L (ref 135–145)
TCO2: 26 mmol/L (ref 0–100)

## 2016-10-18 LAB — GLUCOSE, CAPILLARY: Glucose-Capillary: 156 mg/dL — ABNORMAL HIGH (ref 65–99)

## 2016-10-18 LAB — APTT: aPTT: 26 seconds (ref 24–36)

## 2016-10-18 LAB — CBC
HEMATOCRIT: 37.6 % (ref 36.0–46.0)
Hemoglobin: 12.3 g/dL (ref 12.0–15.0)
MCH: 25.5 pg — ABNORMAL LOW (ref 26.0–34.0)
MCHC: 32.7 g/dL (ref 30.0–36.0)
MCV: 77.8 fL — AB (ref 78.0–100.0)
Platelets: 224 10*3/uL (ref 150–400)
RBC: 4.83 MIL/uL (ref 3.87–5.11)
RDW: 14.2 % (ref 11.5–15.5)
WBC: 6.9 10*3/uL (ref 4.0–10.5)

## 2016-10-18 LAB — PROTIME-INR
INR: 1.07
Prothrombin Time: 13.9 seconds (ref 11.4–15.2)

## 2016-10-18 LAB — PLATELET INHIBITION P2Y12: Platelet Function  P2Y12: 209 [PRU] (ref 194–418)

## 2016-10-18 MED ORDER — FENTANYL CITRATE (PF) 100 MCG/2ML IJ SOLN
INTRAMUSCULAR | Status: AC
  Filled 2016-10-18: qty 2

## 2016-10-18 MED ORDER — HYDRALAZINE HCL 20 MG/ML IJ SOLN
INTRAMUSCULAR | Status: AC
  Filled 2016-10-18: qty 1

## 2016-10-18 MED ORDER — IOPAMIDOL (ISOVUE-300) INJECTION 61%
INTRAVENOUS | Status: AC
  Administered 2016-10-18: 60 mL
  Filled 2016-10-18: qty 100

## 2016-10-18 MED ORDER — SODIUM CHLORIDE 0.9 % IV SOLN: INTRAVENOUS | Status: AC

## 2016-10-18 MED ORDER — MIDAZOLAM HCL 2 MG/2ML IJ SOLN
INTRAMUSCULAR | Status: AC | PRN
  Administered 2016-10-18: 1 mg via INTRAVENOUS

## 2016-10-18 MED ORDER — IOPAMIDOL (ISOVUE-300) INJECTION 61%
INTRAVENOUS | Status: AC
  Administered 2016-10-18: 10 mL
  Filled 2016-10-18: qty 50

## 2016-10-18 MED ORDER — MIDAZOLAM HCL 2 MG/2ML IJ SOLN
INTRAMUSCULAR | Status: AC
  Filled 2016-10-18: qty 2

## 2016-10-18 MED ORDER — FENTANYL CITRATE (PF) 100 MCG/2ML IJ SOLN
INTRAMUSCULAR | Status: AC | PRN
  Administered 2016-10-18: 25 ug via INTRAVENOUS

## 2016-10-18 MED ORDER — HEPARIN SODIUM (PORCINE) 1000 UNIT/ML IJ SOLN
INTRAMUSCULAR | Status: AC
  Filled 2016-10-18: qty 1

## 2016-10-18 MED ORDER — SODIUM CHLORIDE 0.9 % IV SOLN
INTRAVENOUS | Status: DC
  Administered 2016-10-18: 20 mL/h via INTRAVENOUS

## 2016-10-18 MED ORDER — LIDOCAINE HCL 1 % IJ SOLN
INTRAMUSCULAR | Status: AC
Start: 2016-10-18 — End: 2016-10-18
  Filled 2016-10-18: qty 20

## 2016-10-18 MED ORDER — LIDOCAINE HCL 1 % IJ SOLN
INTRAMUSCULAR | Status: AC | PRN
  Administered 2016-10-18: 10 mL

## 2016-10-18 NOTE — Sedation Documentation (Signed)
Patient is resting comfortably. 

## 2016-10-18 NOTE — Sedation Documentation (Signed)
Vital signs stable. 

## 2016-10-18 NOTE — Sedation Documentation (Signed)
No complaints from pt at this time.

## 2016-10-18 NOTE — H&P (Signed)
Chief Complaint: (L) ICA string sign  Referring Physician:Dr. Delia Heady  Supervising Physician: Julieanne Cotton  Patient Status: [facility] - Out-pt  HPI: [name] [name] is an 83 y.o. female with multiple medical problems who is followed by Dr. Pearlean Brownie for a CVA in August of 2017.  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  She has a h/o CAD, PAD, DM, CHF, etc.  She is on plavix for her CVA as well as her procedure for her second left toe ischemia that was repaired.  She has been referred to neuro IR for a diagnostic angiogram to further evaluate this area of stenosis.  She admits to being cold all the time, but denies any recent CP, SOB, fevers, HA, etc.  Past Medical History:  Past Medical History:  Diagnosis Date  . Chronic combined systolic and diastolic CHF, NYHA class 3 (HCC)    a. 03/2013 Echo: EF 25-30%, diff HK, sev antsept HK, mild MR.  . Congestive dilated cardiomyopathy (HCC)    a. 03/2013 Echo: EF 25-30%;  b. 09/2013 s/p SJM 3242 CRT-P.  Marland Kitchen Coronary artery disease    a. 08/2012 Cath: LM nl, LAD 40m, LCX nondom, mild-mod nonobs dzs mid, OM1 ok, OM2 90/90p, RCA 40, EF 25-30%-->Med Rx.  . GERD (gastroesophageal reflux disease)   . High cholesterol   . Hypertension   . Hypothyroidism   . Left bundle branch block   . Osteoarthritis   . Stroke (HCC)   . Type II diabetes mellitus (HCC)     Past Surgical History:  Past Surgical History:  Procedure Laterality Date  . ABDOMINAL HYSTERECTOMY  1980  . BI-VENTRICULAR PACEMAKER INSERTION N/A 10/01/2013   Procedure: BI-VENTRICULAR PACEMAKER INSERTION (CRT-P);  Surgeon: Duke Salvia, MD;  Location: [facility] CATH LAB;  Service: Cardiovascular;  Laterality: N/A;  . BI-VENTRICULAR PACEMAKER INSERTION (CRT-P)     a. 09/2013 s/p SJM 3242 CRT-P.  . Bilateral cateract surgery    . BMD  2006  . ORIF TIBIA PLATEAU Right 12/04/2013   Procedure: OPEN REDUCTION INTERNAL FIXATION (ORIF) TIBIAL PLATEAU;  Surgeon:  Verlee Rossetti, MD;  Location: [facility] ORS;  Service: Orthopedics;  Laterality: Right;  . PERIPHERAL VASCULAR CATHETERIZATION N/A 06/17/2016   Procedure: Lower Extremity Angiography;  Surgeon: Runell Gess, MD;  Location: [facility] INVASIVE CV LAB;  Service: Cardiovascular;  Laterality: N/A;  . PERIPHERAL VASCULAR CATHETERIZATION Left 06/17/2016   Procedure: Peripheral Vascular Atherectomy;  Surgeon: Runell Gess, MD;  Location: [facility] INVASIVE CV LAB;  Service: Cardiovascular;  Laterality: Left;  Popliteal   . RIGHT HEART CATHETERIZATION N/A 09/08/2012   Procedure: RIGHT HEART CATH;  Surgeon: Dolores Patty, MD;  Location: [facility] CATH LAB;  Service: Cardiovascular;  Laterality: N/A;    Family History:  Family History  Problem Relation Age of Onset  . Diabetes Mother   . Diabetes Other   . Hypertension Other   . Uterine cancer Other   . Heart attack Brother     Social History:  reports that she has quit smoking. She has quit using smokeless tobacco. She reports that she does not drink alcohol or use drugs.  Allergies:  Allergies  Allergen Reactions  . Statins Other (See Comments)    Leg cramping  . Metformin And Related     Pt stopped it due to diarrhea  . Vytorin [Ezetimibe-Simvastatin] Other (See Comments)    Leg cramps    Medications: Medications reviewed in  epic  Please HPI for pertinent positives, otherwise complete 10 system ROS negative.  Mallampati Score: MD Evaluation Airway: WNL Heart: Other (comments) Heart  comments: has pacemaker Abdomen: WNL Chest/ Lungs: WNL ASA  Classification: 3 Mallampati/Airway Score: Two  Physical Exam: BP (!) 108/58   Pulse 68   Temp 97.5 F (36.4 C)   Resp 18   Ht 5\' 5"  (1.651 m)   Wt 130 lb (59 kg)   SpO2 100%   BMI 21.63 kg/m  Body mass index is 21.63 kg/m. General: pleasant, elderly-appearing and acing black female who is laying in bed in NAD HEENT: head is normocephalic, atraumatic, has toboggan on.  Sclera are noninjected.   PERRL.  Ears and nose without any masses or lesions.  Mouth is pink and moist Heart: regular, rate, and rhythm.  Normal s1,s2. No obvious murmurs, gallops, or rubs noted.  Palpable bilateral femoral pulses Lungs: CTAB, no wheezes, rhonchi, or rales noted.  Respiratory effort nonlabored Abd: soft, NT, ND, +BS, no masses, hernias, or organomegaly Psych: A&Ox3 with an appropriate affect, but is sleepy.   Labs: Results for orders placed or performed during the hospital encounter of 10/18/16 (from the past 48 hour(s))  Platelet inhibition p2y12 (Not at [facility])     Status: None   Collection Time: 10/18/16  8:26 AM  Result Value Ref Range   Platelet Function  P2Y12 209 194 - 418 PRU    Comment:        The literature has shown a direct correlation of PRU values over 230 with higher risks of thrombotic events.  Lower PRU values are associated with platelet inhibition.   APTT upon arrival     Status: None   Collection Time: 10/18/16  8:26 AM  Result Value Ref Range   aPTT 26 24 - 36 seconds  CBC upon arrival     Status: Abnormal   Collection Time: 10/18/16  8:26 AM  Result Value Ref Range   WBC 6.9 4.0 - 10.5 K/uL   RBC 4.83 3.87 - 5.11 MIL/uL   Hemoglobin 12.3 12.0 - 15.0 g/dL   HCT 09.8 11.9 - 14.7 %   MCV 77.8 (L) 78.0 - 100.0 fL   MCH 25.5 (L) 26.0 - 34.0 pg   MCHC 32.7 30.0 - 36.0 g/dL   RDW 82.9 56.2 - 13.0 %   Platelets 224 150 - 400 K/uL  Protime-INR upon arrival     Status: None   Collection Time: 10/18/16  8:26 AM  Result Value Ref Range   Prothrombin Time 13.9 11.4 - 15.2 seconds   INR 1.07     Imaging: No results found.  Assessment/Plan 1. (L) ICA stenosis with string sign -we will plan to proceed today with diagnostic angiogram to assess this area of concern. -await BMET to make sure creatinine is ok.  If so, we will plan to move forward.  If not, we will plan on hydration with a rescheduled procedure in the future. -Risks and Benefits discussed with the patient  including, but not limited to bleeding, infection, vascular injury or contrast induced renal failure. All of the patient's questions were answered, patient is agreeable to proceed. Consent signed and in chart.  ADDENDUM: Creatinine is 1.35 with a GFR of 42.  We will proceed but with a limited angio of just the area of concern.  IVFs of NS at 75cc/hr prior to the procedure to give her some hydration as well.  Thank you for this interesting consult.  I  greatly enjoyed meeting [name] [name] and look forward to participating in their care.  A copy of this report was sent to the requesting provider on this date.  Electronically Signed: Letha Cape 10/18/2016, 9:58 AM   I spent a total of  30 Minutes   in face to face in clinical consultation, greater than 50% of which was counseling/coordinating care for (L) ICA stenosis, string sign

## 2016-10-18 NOTE — Sedation Documentation (Signed)
Left groin unremarkable. Pt has no complaints at this time.

## 2016-10-18 NOTE — Procedures (Signed)
S/P 4 vessel cerebral arteriogram. Lt CFA approach. Findings. 1.approx 60 % stenosis of Lt CCA at bifurcation,with a  90% stenosis of Lt ECA at its origin 2.approx 50 % tandem stenosis of LT ICA cavernous segment

## 2016-10-18 NOTE — Discharge Instructions (Signed)
Femoral Site Care °Refer to this sheet in the next few weeks. These instructions provide you with information about caring for yourself after your procedure. Your health care provider may also give you more specific instructions. Your treatment has been planned according to current medical practices, but problems sometimes occur. Call your health care provider if you have any problems or questions after your procedure. °What can I expect after the procedure? °After your procedure, it is typical to have the following: °· Bruising at the site that usually fades within 1-2 weeks. °· Blood collecting in the tissue (hematoma) that may be painful to the touch. It should usually decrease in size and tenderness within 1-2 weeks. °Follow these instructions at home: °· Take medicines only as directed by your health care provider. °· You may shower 24-48 hours after the procedure or as directed by your health care provider. Remove the bandage (dressing) and gently wash the site with plain soap and water. Pat the area dry with a clean towel. Do not rub the site, because this may cause bleeding. °· Do not take baths, swim, or use a hot tub until your health care provider approves. °· Check your insertion site every day for redness, swelling, or drainage. °· Do not apply powder or lotion to the site. °· Limit use of stairs to twice a day for the first 2-3 days or as directed by your health care provider. °· Do not squat for the first 2-3 days or as directed by your health care provider. °· Do not lift over 10 lb (4.5 kg) for 5 days after your procedure or as directed by your health care provider. °· Ask your health care provider when it is okay to: °¨ Return to work or school. °¨ Resume usual physical activities or sports. °¨ Resume sexual activity. °· Do not drive home if you are discharged the same day as the procedure. Have someone else drive you. °· You may drive 24 hours after the procedure unless otherwise instructed by  your health care provider. °· Do not operate machinery or power tools for 24 hours after the procedure or as directed by your health care provider. °· If your procedure was done as an outpatient procedure, which means that you went home the same day as your procedure, a responsible adult should be with you for the first 24 hours after you arrive home. °· Keep all follow-up visits as directed by your health care provider. This is important. °Contact a health care provider if: °· You have a fever. °· You have chills. °· You have increased bleeding from the site. Hold pressure on the site. °Get help right away if: °· You have unusual pain at the site. °· You have redness, warmth, or swelling at the site. °· You have drainage (other than a small amount of blood on the dressing) from the site. °· The site is bleeding, and the bleeding does not stop after 30 minutes of holding steady pressure on the site. °· Your leg or foot becomes pale, cool, tingly, or numb. °This information is not intended to replace advice given to you by your health care provider. Make sure you discuss any questions you have with your health care provider. °Document Released: 04/01/2014 Document Revised: 01/04/2016 Document Reviewed: 02/15/2014 °Elsevier Interactive Patient Education © 2017 Elsevier Inc. ° °

## 2016-10-21 ENCOUNTER — Encounter (HOSPITAL_COMMUNITY): Payer: Self-pay | Admitting: Interventional Radiology

## 2016-10-21 ENCOUNTER — Other Ambulatory Visit: Payer: Self-pay

## 2016-10-21 NOTE — Patient Outreach (Addendum)
[facility]) Care Management  10/21/16  [name] [name] [birth year] [id number]  Attempted to reach patient and son without success. Left HIPAA compliant voicemail with RNCM contact information and invited callback. Will attempt outreach again within the next week if call not returned.  [place] R. Eula Mazzola, RN, BSN, CCM [facility] Care Management Coordinator [phone]

## 2016-10-30 ENCOUNTER — Other Ambulatory Visit: Payer: Self-pay

## 2016-10-31 NOTE — Progress Notes (Signed)
Pre visit review using our clinic review tool, if applicable. No additional management support is needed unless otherwise documented below in the visit note. 

## 2016-10-31 NOTE — Progress Notes (Addendum)
Subjective:   [name] is a 83 y.o. female who presents for an Initial Medicare Annual Wellness Visit.  Review of Systems    No ROS.  Medicare Wellness Visit.   Cardiac Risk Factors include: advanced age (>15men, >63 women);diabetes mellitus;dyslipidemia;hypertension;family history of premature cardiovascular disease;sedentary lifestyle Sleep patterns: no sleep issues, feels rested on waking, gets up 1 times nightly to void and sleeps 6 hours nightly.   Home Safety/Smoke Alarms:  Feels safe in home. Smoke alarms in place.   Living environment; residence and Firearm Safety: 1-story house/ trailer, equipment: Medical laboratory scientific officer, Type: Single [place], Walkers, Type: Agricultural consultant, [employer], Type: Tub Seat/Chair and BSC, shower bench, no firearms. Lives with son, good family support and no needs for DME Seat Belt Safety/Bike Helmet: Wears seat belt.   Counseling:   Eye Exam-  Last 6/17, goes yearly Dental- dentures  Female:   Pap- N/A       Mammo-  None, patient states she stopped having them due to her age, per patient the screenings were normal    Dexa scan- Last 11/29/04, patient declined referral today, education provided        CCS- Last 01/29/07, normal, no recall due to age      Objective:    Today's Vitals   11/01/16 1101 11/01/16 1145  BP: (!) 102/58 (!) 106/58  Pulse: 76   Resp: 18   SpO2: 97%   Weight: 126 lb (57.2 kg)   Height: 5\' 5"  (1.651 m)    Body mass index is 20.97 kg/m.   Current Medications (verified) Outpatient Encounter Prescriptions as of 11/01/2016  Medication Sig  . aspirin EC 325 MG tablet Take 325 mg by mouth daily.  . carvedilol (COREG) 12.5 MG tablet TAKE 1 TABLET TWICE A DAY WITH MEALS (Patient taking differently: Take 6.25 mgs twice daily with meals)  . clopidogrel (PLAVIX) 75 MG tablet Take 1 tablet (75 mg total) by mouth daily.  Marland Kitchen co-enzyme Q-10 30 MG capsule Take 30 mg by mouth daily.  . furosemide (LASIX) 20 MG tablet Take 20 mg by mouth  daily as needed for fluid.   Marland Kitchen glipiZIDE (GLUCOTROL) 10 MG tablet TAKE 1 TABLET TWICE A DAY BEFORE MEALS (Patient taking differently: Take 5 mg by mouth 2 (two) times daily before a meal. )  . hydrOXYzine (ATARAX/VISTARIL) 25 MG tablet Take 1 tablet (25 mg total) by mouth 3 (three) times daily as needed. (Patient taking differently: Take 25 mg by mouth 3 (three) times daily as needed for itching. )  . ibuprofen (ADVIL,MOTRIN) 200 MG tablet Take 400 mg by mouth daily as needed for headache or moderate pain.  . Insulin Glargine (LANTUS SOLOSTAR) 100 UNIT/ML Solostar Pen Inject 18 Units into the skin every morning.  Marland Kitchen lisinopril (PRINIVIL,ZESTRIL) 5 MG tablet Take 1 tablet (5 mg total) by mouth 2 (two) times daily.  . Omega-3 Fatty Acids (EQL OMEGA 3 FISH OIL) 1200 MG CAPS Take 1,200 mg by mouth daily.  . pantoprazole (PROTONIX) 40 MG tablet Take 1 tablet (40 mg total) by mouth daily.  . rosuvastatin (CRESTOR) 20 MG tablet Take 1 tablet (20 mg total) by mouth daily.  . Vitamin D, Ergocalciferol, (DRISDOL) 50000 units CAPS capsule TAKE 1 CAPSULE EVERY 7 DAYS ON MONDAYS  . [DISCONTINUED] levothyroxine (SYNTHROID, LEVOTHROID) 137 MCG tablet Take 137 mcg by mouth daily.  . [DISCONTINUED] omeprazole (PRILOSEC) 20 MG capsule Take 20 mg by mouth daily as needed (acid reflux).   No facility-administered encounter  medications on file as of 11/01/2016.     Allergies (verified) Statins; Metformin and related; and Vytorin [ezetimibe-simvastatin]   History: Past Medical History:  Diagnosis Date  . Chronic combined systolic and diastolic CHF, NYHA class 3 (HCC)    a. 03/2013 Echo: EF 25-30%, diff HK, sev antsept HK, mild MR.  . Congestive dilated cardiomyopathy (HCC)    a. 03/2013 Echo: EF 25-30%;  b. 09/2013 s/p SJM 3242 CRT-P.  Marland Kitchen Coronary artery disease    a. 08/2012 Cath: LM nl, LAD 79m, LCX nondom, mild-mod nonobs dzs mid, OM1 ok, OM2 90/90p, RCA 40, EF 25-30%-->Med Rx.  . GERD (gastroesophageal reflux  disease)   . High cholesterol   . Hypertension   . Hypothyroidism   . Left bundle branch block   . Osteoarthritis   . Stroke (HCC)   . Type II diabetes mellitus (HCC)    Past Surgical History:  Procedure Laterality Date  . ABDOMINAL HYSTERECTOMY  1980  . BI-VENTRICULAR PACEMAKER INSERTION N/A 10/01/2013   Procedure: BI-VENTRICULAR PACEMAKER INSERTION (CRT-P);  Surgeon: Duke Salvia, MD;  Location: [facility] CATH LAB;  Service: Cardiovascular;  Laterality: N/A;  . BI-VENTRICULAR PACEMAKER INSERTION (CRT-P)     a. 09/2013 s/p SJM 3242 CRT-P.  . Bilateral cateract surgery    . BMD  2006  . IR GENERIC HISTORICAL  10/18/2016   IR ANGIO INTRA EXTRACRAN SEL COM CAROTID INNOMINATE BILAT MOD SED 10/18/2016 Julieanne Cotton, MD [facility]-INTERV RAD  . IR GENERIC HISTORICAL  10/18/2016   IR ANGIO VERTEBRAL SEL SUBCLAVIAN INNOMINATE BILAT MOD SED 10/18/2016 Julieanne Cotton, MD [facility]-INTERV RAD  . ORIF TIBIA PLATEAU Right 12/04/2013   Procedure: OPEN REDUCTION INTERNAL FIXATION (ORIF) TIBIAL PLATEAU;  Surgeon: Verlee Rossetti, MD;  Location: [facility] ORS;  Service: Orthopedics;  Laterality: Right;  . PERIPHERAL VASCULAR CATHETERIZATION N/A 06/17/2016   Procedure: Lower Extremity Angiography;  Surgeon: Runell Gess, MD;  Location: [facility] INVASIVE CV LAB;  Service: Cardiovascular;  Laterality: N/A;  . PERIPHERAL VASCULAR CATHETERIZATION Left 06/17/2016   Procedure: Peripheral Vascular Atherectomy;  Surgeon: Runell Gess, MD;  Location: [facility] INVASIVE CV LAB;  Service: Cardiovascular;  Laterality: Left;  Popliteal   . RIGHT HEART CATHETERIZATION N/A 09/08/2012   Procedure: RIGHT HEART CATH;  Surgeon: Dolores Patty, MD;  Location: [facility] CATH LAB;  Service: Cardiovascular;  Laterality: N/A;   Family History  Problem Relation Age of Onset  . Diabetes Mother   . Diabetes Other   . Hypertension Other   . Uterine cancer Other   . Heart attack Brother    Social History   Occupational History  . Retired Engineer, civil (consulting)    Social  History Main Topics  . Smoking status: Former Games developer  . Smokeless tobacco: Former Neurosurgeon  . Alcohol use No  . Drug use: No  . Sexual activity: Not Currently    Tobacco Counseling Counseling given: Not Answered   Activities of Daily Living In your present state of health, do you have any difficulty performing the following activities: 11/01/2016 10/18/2016  Hearing? Y N  Vision? Y N  Difficulty concentrating or making decisions? Y N  Walking or climbing stairs? Y Y  Dressing or bathing? Y N  Doing errands, shopping? Y -  Quarry manager and eating ? Y -  Using the Toilet? N -  In the past six months, have you accidently leaked urine? Y -  Do you have problems with loss of bowel control? N -  Managing your Medications? Y -  Managing your Finances? Y -  Housekeeping or managing your Housekeeping? Y -  Some recent data might be hidden    Immunizations and Health Maintenance Immunization History  Administered Date(s) Administered  . Influenza Split 05/12/2012  . Influenza Whole 06/21/2009, 05/11/2010, 04/18/2011  . Influenza, High Dose Seasonal PF 05/29/2016  . Influenza,inj,Quad PF,36+ Mos 05/14/2013, 05/03/2015  . Pneumococcal Conjugate-13 03/10/2014  . Pneumococcal Polysaccharide-23 08/01/2012  . Tdap 11/29/2010   There are no preventive care reminders to display for this patient.  Patient Care Team: [name], MD as PCP - General Eunice Blase, RN as [facility] Care Management  Indicate any recent Medical Services you may have received from other than [facility] providers in the past year (date may be approximate).     Assessment:   This is a routine wellness examination for [name] [name]. Physical assessment deferred to PCP.   Hearing/Vision screen Hearing Screening Comments: Had difficulty hearing words that were whispered in her direction. Patient declined audiology referral Vision Screening Comments: Wear glasses, limited left peripheral vision due  to stroke  Dietary issues and exercise activities discussed: Current Exercise Habits: The patient does not participate in regular exercise at present (states she wants to start walking in her neighborhood, discussed chair exercises), Exercise limited by: (S) Other - see comments (S/P stroke, residual weakness)  Diet (meal preparation, eat out, water intake, caffeinated beverages, dairy products, fruits and vegetables): Patient reports losing 7 pounds within the last few months. Generally eats healthy, well balanced diet. Son states patient is not eating as much as we would like. Drinks approximately 4 cups of water per day. Limits sugar and caffeine drinks.   Discussed low salt, low fat, diabetic diet. Encouraged patient to increase her daily water intake. Encouraged patient to eat frequently and to supplement with glucerna.  Goals    . increase exercise          Start walk in my neighborhood 2 times a week.      Depression Screen PHQ 2/9 Scores 11/01/2016  PHQ - 2 Score 2  PHQ- 9 Score 4    Fall Risk Fall Risk  11/01/2016 08/27/2016 04/16/2016 03/29/2016  Falls in the past year? Yes Yes Yes Yes  Number falls in past yr: 2 or more 2 or more 1 1  Injury with Fall? Yes No No No  Risk Factor Category  High Fall Risk - - -  Risk for fall due to : Impaired mobility;Impaired vision;Impaired balance/gait - - -  Follow up Falls prevention discussed;Education provided - - -    Cognitive Function: MMSE - Mini Mental State Exam 11/01/2016 11/01/2016  Not completed: Unable to complete Unable to complete        Screening Tests Health Maintenance  Topic Date Due  . OPHTHALMOLOGY EXAM  10/31/2017 (Originally 10/01/2016)  . HEMOGLOBIN A1C  04/02/2017  . FOOT EXAM  10/03/2017  . TETANUS/TDAP  11/28/2020  . INFLUENZA VACCINE  Addressed  . DEXA SCAN  Completed  . PNA vac Low Risk Adult  Completed      Plan:    Start doing brain stimulating activities (puzzles, reading, adult coloring books,  staying active) to keep memory sharp.  AARP website for memory games  Start to drink glucerna in between meals as a food supplement   Low salt, low fat, and diabetic diet education attached to patient's AVS.  During the course of the visit, Perline was educated and counseled about the following appropriate screening and preventive  services:   Vaccines to include Pneumoccal, Influenza, Hepatitis B, Td, Zostavax, HCV  Cardiovascular disease screening  Colorectal cancer screening  Bone density screening  Diabetes screening  Glaucoma screening  Mammography/PAP  Nutrition counseling  Patient Instructions (the written plan) were given to the patient.    Wanda Plump, RN   11/01/2016

## 2016-11-01 ENCOUNTER — Ambulatory Visit (INDEPENDENT_AMBULATORY_CARE_PROVIDER_SITE_OTHER): Payer: Medicare Other | Admitting: *Deleted

## 2016-11-01 VITALS — BP 106/58 | HR 76 | Resp 18 | Ht 65.0 in | Wt 126.0 lb

## 2016-11-01 DIAGNOSIS — Z0000 Encounter for general adult medical examination without abnormal findings: Secondary | ICD-10-CM | POA: Diagnosis not present

## 2016-11-01 NOTE — Progress Notes (Signed)
Medical screening examination/treatment/procedure(s) were performed by non-physician practitioner and as supervising physician I was immediately available for consultation/collaboration. I agree with above. Modene Andy A Rahmah Mccamy, MD 

## 2016-11-01 NOTE — Patient Instructions (Addendum)
Start doing brain stimulating activities (puzzles, reading, adult coloring books, staying active) to keep memory sharp.  AARP website for memory games  Start to drink glucerna in between meals as a food supplement   [name] [name] , Thank you for taking time to come for your Medicare Wellness Visit. I appreciate your ongoing commitment to your health goals. Please review the following plan we discussed and let me know if I can assist you in the future.   These are the goals we discussed: Goals    . increase exercise          Start walk in my neighborhood 2 times a week.       This is a list of the screening recommended for you and due dates:  Health Maintenance  Topic Date Due  . Eye exam for diabetics  10/31/2017*  . Hemoglobin A1C  04/02/2017  . Complete foot exam   10/03/2017  . Tetanus Vaccine  11/28/2020  . Flu Shot  Addressed  . DEXA scan (bone density measurement)  Completed  . Pneumonia vaccines  Completed  *Topic was postponed. The date shown is not the original due date.    Bone Densitometry Bone densitometry is an imaging test that uses a special X-ray to measure the amount of calcium and other minerals in your bones (bone density). This test is also known as a bone mineral density test or dual-energy X-ray absorptiometry (DXA). The test can measure bone density at your hip and your spine. It is similar to having a regular X-ray. You may have this test to:  Diagnose a condition that causes weak or thin bones (osteoporosis).  Predict your risk of a broken bone (fracture).  Determine how well osteoporosis treatment is working. Tell a health care provider about:  Any allergies you have.  All medicines you are taking, including vitamins, herbs, eye drops, creams, and over-the-counter medicines.  Any problems you or family members have had with anesthetic medicines.  Any blood disorders you have.  Any surgeries you have had.  Any medical conditions you  have.  Possibility of pregnancy.  Any other medical test you had within the previous 14 days that used contrast material. What are the risks? Generally, this is a safe procedure. However, problems can occur and may include the following:  This test exposes you to a very small amount of radiation.  The risks of radiation exposure may be greater to unborn children. What happens before the procedure?  Do not take any calcium supplements for 24 hours before having the test. You can otherwise eat and drink what you usually do.  Take off all metal jewelry, eyeglasses, dental appliances, and any other metal objects. What happens during the procedure?  You may lie on an exam table. There will be an X-ray generator below you and an imaging device above you.  Other devices, such as boxes or braces, may be used to position your body properly for the scan.  You will need to lie still while the machine slowly scans your body.  The images will show up on a computer monitor. What happens after the procedure? You may need more testing at a later time. This information is not intended to replace advice given to you by your health care provider. Make sure you discuss any questions you have with your health care provider. Document Released: 08/20/2004 Document Revised: 01/04/2016 Document Reviewed: 01/06/2014 Elsevier Interactive Patient Education  2017 [employer]   Fat and Cholesterol Restricted Diet  Getting too much fat and cholesterol in your diet may cause health problems. Following this diet helps keep your fat and cholesterol at normal levels. This can keep you from getting sick. What types of fat should I choose?  Choose monosaturated and polyunsaturated fats. These are found in foods such as olive oil, canola oil, flaxseeds, walnuts, almonds, and seeds.  Eat more omega-3 fats. Good choices include salmon, mackerel, sardines, tuna, flaxseed oil, and ground flaxseeds.  Limit saturated  fats. These are in animal products such as meats, butter, and cream. They can also be in plant products such as palm oil, palm kernel oil, and coconut oil.  Avoid foods with partially hydrogenated oils in them. These contain trans fats. Examples of foods that have trans fats are stick margarine, some tub margarines, cookies, crackers, and other baked goods. What general guidelines do I need to follow?  Check food labels. Look for the words "trans fat" and "saturated fat."  When preparing a meal:  Fill half of your plate with vegetables and green salads.  Fill one fourth of your plate with whole grains. Look for the word "whole" as the first word in the ingredient list.  Fill one fourth of your plate with lean protein foods.  Eat more foods that have fiber, like apples, carrots, beans, peas, and barley.  Eat more home-cooked foods. Eat less at restaurants and buffets.  Limit or avoid alcohol.  Limit foods high in starch and sugar.  Limit fried foods.  Cook foods without frying them. Baking, boiling, grilling, and broiling are all great options.  Lose weight if you are overweight. Losing even a small amount of weight can help your overall health. It can also help prevent diseases such as diabetes and heart disease. What foods can I eat? Grains  Whole grains, such as whole wheat or whole grain breads, crackers, cereals, and pasta. Unsweetened oatmeal, bulgur, barley, quinoa, or brown rice. Corn or whole wheat flour tortillas. Vegetables  Fresh or frozen vegetables (raw, steamed, roasted, or grilled). Green salads. Fruits  All fresh, canned (in natural juice), or frozen fruits. Meat and Other Protein Products  Ground beef (85% or leaner), grass-fed beef, or beef trimmed of fat. Skinless chicken or [place]. Ground chicken or [place]. Pork trimmed of fat. All fish and seafood. Eggs. Dried beans, peas, or lentils. Unsalted nuts or seeds. Unsalted canned or dry beans. Dairy  Low-fat dairy  products, such as skim or 1% milk, 2% or reduced-fat cheeses, low-fat ricotta or cottage cheese, or plain low-fat yogurt. Fats and Oils  Tub margarines without trans fats. Light or reduced-fat mayonnaise and salad dressings. Avocado. Olive, canola, sesame, or safflower oils. Natural peanut or almond butter (choose ones without added sugar and oil). The items listed above may not be a complete list of recommended foods or beverages. Contact your dietitian for more options.  What foods are not recommended? Grains  White bread. White pasta. White rice. Cornbread. Bagels, pastries, and croissants. Crackers that contain trans fat. Vegetables  White potatoes. Corn. Creamed or fried vegetables. Vegetables in a cheese sauce. Fruits  Dried fruits. Canned fruit in light or heavy syrup. Fruit juice. Meat and Other Protein Products  Fatty cuts of meat. Ribs, chicken wings, bacon, sausage, bologna, salami, chitterlings, fatback, hot dogs, bratwurst, and packaged luncheon meats. Liver and organ meats. Dairy  Whole or 2% milk, cream, half-and-half, and cream cheese. Whole milk cheeses. Whole-fat or sweetened yogurt. Full-fat cheeses. Nondairy creamers and whipped toppings. Processed cheese, cheese  spreads, or cheese curds. Sweets and Desserts  Corn syrup, sugars, honey, and molasses. Candy. Jam and jelly. Syrup. Sweetened cereals. Cookies, pies, cakes, donuts, muffins, and ice cream. Fats and Oils  Butter, stick margarine, lard, shortening, ghee, or bacon fat. Coconut, palm kernel, or palm oils. Beverages  Alcohol. Sweetened drinks (such as sodas, lemonade, and fruit drinks or punches). The items listed above may not be a complete list of foods and beverages to avoid. Contact your dietitian for more information.  This information is not intended to replace advice given to you by your health care provider. Make sure you discuss any questions you have with your health care provider. Document Released:  01/28/2012 Document Revised: 04/04/2016 Document Reviewed: 10/28/2013 Elsevier Interactive Patient Education  2017 [employer].   Fall Prevention in the Home Falls can cause injuries. They can happen to people of all ages. There are many things you can do to make your home safe and to help prevent falls. What can I do on the outside of my home?  Regularly fix the edges of walkways and driveways and fix any cracks.  Remove anything that might make you trip as you walk through a door, such as a raised step or threshold.  Trim any bushes or trees on the path to your home.  Use bright outdoor lighting.  Clear any walking paths of anything that might make someone trip, such as rocks or tools.  Regularly check to see if handrails are loose or broken. Make sure that both sides of any steps have handrails.  Any raised decks and porches should have guardrails on the edges.  Have any leaves, snow, or ice cleared regularly.  Use sand or salt on walking paths during winter.  Clean up any spills in your garage right away. This includes oil or grease spills. What can I do in the bathroom?  Use night lights.  Install grab bars by the toilet and in the tub and shower. Do not use towel bars as grab bars.  Use non-skid mats or decals in the tub or shower.  If you need to sit down in the shower, use a plastic, non-slip stool.  Keep the floor dry. Clean up any water that spills on the floor as soon as it happens.  Remove soap buildup in the tub or shower regularly.  Attach bath mats securely with double-sided non-slip rug tape.  Do not have throw rugs and other things on the floor that can make you trip. What can I do in the bedroom?  Use night lights.  Make sure that you have a light by your bed that is easy to reach.  Do not use any sheets or blankets that are too big for your bed. They should not hang down onto the floor.  Have a firm chair that has side arms. You can use this  for support while you get dressed.  Do not have throw rugs and other things on the floor that can make you trip. What can I do in the kitchen?  Clean up any spills right away.  Avoid walking on wet floors.  Keep items that you use a lot in easy-to-reach places.  If you need to reach something above you, use a strong step stool that has a grab bar.  Keep electrical cords out of the way.  Do not use floor polish or wax that makes floors slippery. If you must use wax, use non-skid floor wax.  Do not have throw rugs  and other things on the floor that can make you trip. What can I do with my stairs?  Do not leave any items on the stairs.  Make sure that there are handrails on both sides of the stairs and use them. Fix handrails that are broken or loose. Make sure that handrails are as long as the stairways.  Check any carpeting to make sure that it is firmly attached to the stairs. Fix any carpet that is loose or worn.  Avoid having throw rugs at the top or bottom of the stairs. If you do have throw rugs, attach them to the floor with carpet tape.  Make sure that you have a light switch at the top of the stairs and the bottom of the stairs. If you do not have them, ask someone to add them for you. What else can I do to help prevent falls?  Wear shoes that:  Do not have high heels.  Have rubber bottoms.  Are comfortable and fit you well.  Are closed at the toe. Do not wear sandals.  If you use a stepladder:  Make sure that it is fully opened. Do not climb a closed stepladder.  Make sure that both sides of the stepladder are locked into place.  Ask someone to hold it for you, if possible.  Clearly mark and make sure that you can see:  Any grab bars or handrails.  First and last steps.  Where the edge of each step is.  Use tools that help you move around (mobility aids) if they are needed. These include:  Canes.  Walkers.  Scooters.  Crutches.  Turn on the  lights when you go into a dark area. Replace any light bulbs as soon as they burn out.  Set up your furniture so you have a clear path. Avoid moving your furniture around.  If any of your floors are uneven, fix them.  If there are any pets around you, be aware of where they are.  Review your medicines with your doctor. Some medicines can make you feel dizzy. This can increase your chance of falling. Ask your doctor what other things that you can do to help prevent falls. This information is not intended to replace advice given to you by your health care provider. Make sure you discuss any questions you have with your health care provider. Document Released: 05/25/2009 Document Revised: 01/04/2016 Document Reviewed: 09/02/2014 Elsevier Interactive Patient Education  2017 [employer] Inc.   Hearing Loss Hearing loss is a partial or total loss of the ability to hear. This can be temporary or permanent, and it can happen in one or both ears. Hearing loss may be referred to as deafness. Medical care is necessary to treat hearing loss properly and to prevent the condition from getting worse. Your hearing may partially or completely come back, depending on what caused your hearing loss and how severe it is. In some cases, hearing loss is permanent. What are the causes? Common causes of hearing loss include:  Too much wax in the ear canal.  Infection of the ear canal or middle ear.  Fluid in the middle ear.  Injury to the ear or surrounding area.  An object stuck in the ear.  Prolonged exposure to loud sounds, such as music. Less common causes of hearing loss include:  Tumors in the ear.  Viral or bacterial infections, such as meningitis.  A hole in the eardrum (perforated eardrum).  Problems with the hearing nerve that  sends signals between the brain and the ear.  Certain medicines. What are the signs or symptoms? Symptoms of this condition may include:  Difficulty telling the  difference between sounds.  Difficulty following a conversation when there is background noise.  Lack of response to sounds in your environment. This may be most noticeable when you do not respond to startling sounds.  Needing to turn up the volume on the television, radio, etc.  Ringing in the ears.  Dizziness.  Pain in the ears. How is this diagnosed? This condition is diagnosed based on a physical exam and a hearing test (audiometry). The audiometry test will be performed by a hearing specialist (audiologist). You may also be referred to an ear, nose, and throat (ENT) specialist (otolaryngologist). How is this treated? Treatment for recent onset of hearing loss may include:  Ear wax removal.  Being prescribed medicines to prevent infection (antibiotics).  Being prescribed medicines to reduce inflammation (corticosteroids). Follow these instructions at home:  If you were prescribed an antibiotic medicine, take it as told by your health care provider. Do not stop taking the antibiotic even if you start to feel better.  Take over-the-counter and prescription medicines only as told by your health care provider.  Avoid loud noises.  Return to your normal activities as told by your health care provider. Ask your health care provider what activities are safe for you.  Keep all follow-up visits as told by your health care provider. This is important. Contact a health care provider if:  You feel dizzy.  You develop new symptoms.  You vomit or feel nauseous.  You have a fever. Get help right away if:  You develop sudden changes in your vision.  You have severe ear pain.  You have new or increased weakness.  You have a severe headache. This information is not intended to replace advice given to you by your health care provider. Make sure you discuss any questions you have with your health care provider. Document Released: 07/29/2005 Document Revised: 01/04/2016 Document  Reviewed: 12/14/2014 Elsevier Interactive Patient Education  2017 [employer] Inc.  Carbohydrate Counting for Diabetes Mellitus, Adult Carbohydrate counting is a method for keeping track of how many carbohydrates you eat. Eating carbohydrates naturally increases the amount of sugar (glucose) in the blood. Counting how many carbohydrates you eat helps keep your blood glucose within normal limits, which helps you manage your diabetes (diabetes mellitus). It is important to know how many carbohydrates you can safely have in each meal. This is different for every person. A diet and nutrition specialist (registered dietitian) can help you make a meal plan and calculate how many carbohydrates you should have at each meal and snack. Carbohydrates are found in the following foods:  Grains, such as breads and cereals.  Dried beans and soy products.  Starchy vegetables, such as potatoes, peas, and corn.  Fruit and fruit juices.  Milk and yogurt.  Sweets and snack foods, such as cake, cookies, candy, chips, and soft drinks. How do I count carbohydrates? There are two ways to count carbohydrates in food. You can use either of the methods or a combination of both. Reading "Nutrition Facts" on packaged food  The "Nutrition Facts" list is included on the labels of almost all packaged foods and beverages in the [place] It includes:  The serving size.  Information about nutrients in each serving, including the grams (g) of carbohydrate per serving. To use the "Nutrition Facts":  Decide how many servings you will  have.  Multiply the number of servings by the number of carbohydrates per serving.  The resulting number is the total amount of carbohydrates that you will be having. Learning standard serving sizes of other foods  When you eat foods containing carbohydrates that are not packaged or do not include "Nutrition Facts" on the label, you need to measure the servings in order to count the amount of  carbohydrates:  Measure the foods that you will eat with a food scale or measuring cup, if needed.  Decide how many standard-size servings you will eat.  Multiply the number of servings by 15. Most carbohydrate-rich foods have about 15 g of carbohydrates per serving.  For example, if you eat 8 oz (170 g) of strawberries, you will have eaten 2 servings and 30 g of carbohydrates (2 servings x 15 g = 30 g).  For foods that have more than one food mixed, such as soups and casseroles, you must count the carbohydrates in each food that is included. The following list contains standard serving sizes of common carbohydrate-rich foods. Each of these servings has about 15 g of carbohydrates:   hamburger bun or  English muffin.   oz (15 mL) syrup.   oz (14 g) jelly.  1 slice of bread.  1 six-inch tortilla.  3 oz (85 g) cooked rice or pasta.  4 oz (113 g) cooked dried beans.  4 oz (113 g) starchy vegetable, such as peas, corn, or potatoes.  4 oz (113 g) hot cereal.  4 oz (113 g) mashed potatoes or  of a large baked potato.  4 oz (113 g) canned or frozen fruit.  4 oz (120 mL) fruit juice.  4-6 crackers.  6 chicken nuggets.  6 oz (170 g) unsweetened dry cereal.  6 oz (170 g) plain fat-free yogurt or yogurt sweetened with artificial sweeteners.  8 oz (240 mL) milk.  8 oz (170 g) fresh fruit or one small piece of fruit.  24 oz (680 g) popped popcorn. Example of carbohydrate counting Sample meal   3 oz (85 g) chicken breast.  6 oz (170 g) brown rice.  4 oz (113 g) corn.  8 oz (240 mL) milk.  8 oz (170 g) strawberries with sugar-free whipped topping. Carbohydrate calculation  1. Identify the foods that contain carbohydrates:  Rice.  Corn.  Milk.  Strawberries. 2. Calculate how many servings you have of each food:  2 servings rice.  1 serving corn.  1 serving milk.  1 serving strawberries. 3. Multiply each number of servings by 15 g:  2 servings  rice x 15 g = 30 g.  1 serving corn x 15 g = 15 g.  1 serving milk x 15 g = 15 g.  1 serving strawberries x 15 g = 15 g. 4. Add together all of the amounts to find the total grams of carbohydrates eaten:  30 g + 15 g + 15 g + 15 g = 75 g of carbohydrates total. This information is not intended to replace advice given to you by your health care provider. Make sure you discuss any questions you have with your health care provider. Document Released: 07/29/2005 Document Revised: 02/16/2016 Document Reviewed: 01/10/2016 Elsevier Interactive Patient Education  2017 [employer]  DASH Eating Plan DASH stands for "Dietary Approaches to Stop Hypertension." The DASH eating plan is a healthy eating plan that has been shown to reduce high blood pressure (hypertension). It may also reduce your risk for type  2 diabetes, heart disease, and stroke. The DASH eating plan may also help with weight loss. What are tips for following this plan? General guidelines   Avoid eating more than 2,300 mg (milligrams) of salt (sodium) a day. If you have hypertension, you may need to reduce your sodium intake to 1,500 mg a day.  Limit alcohol intake to no more than 1 drink a day for nonpregnant women and 2 drinks a day for men. One drink equals 12 oz of beer, 5 oz of [name] [name], or 1 oz of hard liquor.  Work with your health care provider to maintain a healthy body weight or to lose weight. Ask what an ideal weight is for you.  Get at least 30 minutes of exercise that causes your heart to beat faster (aerobic exercise) most days of the week. Activities may include walking, swimming, or biking.  Work with your health care provider or diet and nutrition specialist (dietitian) to adjust your eating plan to your individual calorie needs. Reading food labels   Check food labels for the amount of sodium per serving. Choose foods with less than 5 percent of the Daily Value of sodium. Generally, foods with less than 300 mg of  sodium per serving fit into this eating plan.  To find whole grains, look for the word "whole" as the first word in the ingredient list. Shopping   Buy products labeled as "low-sodium" or "no salt added."  Buy fresh foods. Avoid canned foods and premade or frozen meals. Cooking   Avoid adding salt when cooking. Use salt-free seasonings or herbs instead of table salt or sea salt. Check with your health care provider or pharmacist before using salt substitutes.  Do not fry foods. Cook foods using healthy methods such as baking, boiling, grilling, and broiling instead.  Cook with heart-healthy oils, such as olive, canola, soybean, or sunflower oil. Meal planning    Eat a balanced diet that includes:  5 or more servings of fruits and vegetables each day. At each meal, try to fill half of your plate with fruits and vegetables.  Up to 6-8 servings of whole grains each day.  Less than 6 oz of lean meat, poultry, or fish each day. A 3-oz serving of meat is about the same size as a deck of cards. One egg equals 1 oz.  2 servings of low-fat dairy each day.  A serving of nuts, seeds, or beans 5 times each week.  Heart-healthy fats. Healthy fats called Omega-3 fatty acids are found in foods such as flaxseeds and coldwater fish, like sardines, salmon, and mackerel.  Limit how much you eat of the following:  Canned or prepackaged foods.  Food that is high in trans fat, such as fried foods.  Food that is high in saturated fat, such as fatty meat.  Sweets, desserts, sugary drinks, and other foods with added sugar.  Full-fat dairy products.  Do not salt foods before eating.  Try to eat at least 2 vegetarian meals each week.  Eat more home-cooked food and less restaurant, buffet, and fast food.  When eating at a restaurant, ask that your food be prepared with less salt or no salt, if possible. What foods are recommended? The items listed may not be a complete list. Talk with your  dietitian about what dietary choices are best for you. Grains  Whole-grain or whole-wheat bread. Whole-grain or whole-wheat pasta. Brown rice. [name] [name]. Bulgur. Whole-grain and low-sodium cereals. Pita bread. Low-fat, low-sodium crackers. Whole-wheat flour  tortillas. Vegetables  Fresh or frozen vegetables (raw, steamed, roasted, or grilled). Low-sodium or reduced-sodium tomato and vegetable juice. Low-sodium or reduced-sodium tomato sauce and tomato paste. Low-sodium or reduced-sodium canned vegetables. Fruits  All fresh, dried, or frozen fruit. Canned fruit in natural juice (without added sugar). Meat and other protein foods  Skinless chicken or [place]. Ground chicken or [place]. Pork with fat trimmed off. Fish and seafood. Egg whites. Dried beans, peas, or lentils. Unsalted nuts, nut butters, and seeds. Unsalted canned beans. Lean cuts of beef with fat trimmed off. Low-sodium, lean deli meat. Dairy  Low-fat (1%) or fat-free (skim) milk. Fat-free, low-fat, or reduced-fat cheeses. Nonfat, low-sodium ricotta or cottage cheese. Low-fat or nonfat yogurt. Low-fat, low-sodium cheese. Fats and oils  Soft margarine without trans fats. Vegetable oil. Low-fat, reduced-fat, or light mayonnaise and salad dressings (reduced-sodium). Canola, safflower, olive, soybean, and sunflower oils. Avocado. Seasoning and other foods  Herbs. Spices. Seasoning mixes without salt. Unsalted popcorn and pretzels. Fat-free sweets. What foods are not recommended? The items listed may not be a complete list. Talk with your dietitian about what dietary choices are best for you. Grains  Baked goods made with fat, such as croissants, muffins, or some breads. Dry pasta or rice meal packs. Vegetables  Creamed or fried vegetables. Vegetables in a cheese sauce. Regular canned vegetables (not low-sodium or reduced-sodium). Regular canned tomato sauce and paste (not low-sodium or reduced-sodium). Regular tomato and vegetable juice  (not low-sodium or reduced-sodium). [name] [name]. Olives. Fruits  Canned fruit in a light or heavy syrup. Fried fruit. Fruit in cream or butter sauce. Meat and other protein foods  Fatty cuts of meat. Ribs. Fried meat. [name] [name]. Sausage. Bologna and other processed lunch meats. Salami. Fatback. Hotdogs. Bratwurst. Salted nuts and seeds. Canned beans with added salt. Canned or smoked fish. Whole eggs or egg yolks. Chicken or [place] with skin. Dairy  Whole or 2% milk, cream, and half-and-half. Whole or full-fat cream cheese. Whole-fat or sweetened yogurt. Full-fat cheese. Nondairy creamers. Whipped toppings. Processed cheese and cheese spreads. Fats and oils  Butter. Stick margarine. Lard. Shortening. Ghee. Bacon fat. Tropical oils, such as coconut, palm kernel, or palm oil. Seasoning and other foods  Salted popcorn and pretzels. Onion salt, garlic salt, seasoned salt, table salt, and sea salt. Worcestershire sauce. Tartar sauce. Barbecue sauce. Teriyaki sauce. Soy sauce, including reduced-sodium. Steak sauce. Canned and packaged gravies. Fish sauce. Oyster sauce. Cocktail sauce. Horseradish that you find on the shelf. Ketchup. Mustard. Meat flavorings and tenderizers. Bouillon cubes. Hot sauce and Tabasco sauce. Premade or packaged marinades. Premade or packaged taco seasonings. Relishes. Regular salad dressings. Where to find more information:  National Heart, Lung, and Blood Institute: PopSteam.is  American Heart Association: www.heart.org Summary  The DASH eating plan is a healthy eating plan that has been shown to reduce high blood pressure (hypertension). It may also reduce your risk for type 2 diabetes, heart disease, and stroke.  With the DASH eating plan, you should limit salt (sodium) intake to 2,300 mg a day. If you have hypertension, you may need to reduce your sodium intake to 1,500 mg a day.  When on the DASH eating plan, aim to eat more fresh fruits and vegetables, whole grains, lean  proteins, low-fat dairy, and heart-healthy fats.  Work with your health care provider or diet and nutrition specialist (dietitian) to adjust your eating plan to your individual calorie needs. This information is not intended to replace advice given to you by your health care provider.  Make sure you discuss any questions you have with your health care provider. Document Released: 07/18/2011 Document Revised: 07/22/2016 Document Reviewed: 07/22/2016 Elsevier Interactive Patient Education  2017 [employer].

## 2016-11-05 NOTE — Patient Outreach (Signed)
[facility]) Care Management  Late entry for 10/30/2016  [name] [name] [birth year] [id number]  Received inbound call from patient's son, Selena Batten. He reported that he was returning call. RNCM provided education about [facility] program and offered to do a home visit with patient and family to explain further and to complete an assessment and he was agreeable. Home visit was scheduled for next week.  Turkey R. Graciano Batson, RN, BSN, CCM [facility] Care Management Coordinator [phone]

## 2016-11-06 ENCOUNTER — Other Ambulatory Visit: Payer: Self-pay

## 2016-11-06 NOTE — Patient Outreach (Signed)
[facility]) Care Management  [facility] Care Manager  11/06/2016   Roshaunda Starkey 1934-02-18 [id number]  Home visit completed with patient and her son.  Subjective: Patient stated that she is doing ok today. Currently has no complaints or concerns.  Objective: Blood pressure (!) 100/52, pulse 71, SpO2 95 %.  Encounter Medications:  Outpatient Encounter Prescriptions as of 11/06/2016  Medication Sig  . aspirin EC 325 MG tablet Take 325 mg by mouth daily.  . carvedilol (COREG) 12.5 MG tablet TAKE 1 TABLET TWICE A DAY WITH MEALS (Patient taking differently: Take 6.25 mgs twice daily with meals)  . clopidogrel (PLAVIX) 75 MG tablet Take 1 tablet (75 mg total) by mouth daily.  Marland Kitchen co-enzyme Q-10 30 MG capsule Take 30 mg by mouth daily.  . furosemide (LASIX) 20 MG tablet Take 20 mg by mouth daily as needed for fluid.   Marland Kitchen glipiZIDE (GLUCOTROL) 10 MG tablet TAKE 1 TABLET TWICE A DAY BEFORE MEALS (Patient taking differently: Take 5 mg by mouth 2 (two) times daily before a meal. )  . ibuprofen (ADVIL,MOTRIN) 200 MG tablet Take 400 mg by mouth daily as needed for headache or moderate pain.  . Insulin Glargine (LANTUS SOLOSTAR) 100 UNIT/ML Solostar Pen Inject 18 Units into the skin every morning.  Marland Kitchen lisinopril (PRINIVIL,ZESTRIL) 5 MG tablet Take 1 tablet (5 mg total) by mouth 2 (two) times daily.  . Omega-3 Fatty Acids (EQL OMEGA 3 FISH OIL) 1200 MG CAPS Take 1,200 mg by mouth daily.  . pantoprazole (PROTONIX) 40 MG tablet Take 1 tablet (40 mg total) by mouth daily.  . rosuvastatin (CRESTOR) 20 MG tablet Take 1 tablet (20 mg total) by mouth daily.  . Vitamin D, Ergocalciferol, (DRISDOL) 50000 units CAPS capsule TAKE 1 CAPSULE EVERY 7 DAYS ON MONDAYS  . hydrOXYzine (ATARAX/VISTARIL) 25 MG tablet Take 1 tablet (25 mg total) by mouth 3 (three) times daily as needed. (Patient taking differently: Take 25 mg by mouth 3 (three) times daily as needed for itching. )   No facility-administered  encounter medications on file as of 11/06/2016.     Functional Status:  In your present state of health, do you have any difficulty performing the following activities: 11/06/2016 11/01/2016  Hearing? Malvin Johns  Vision? Y Y  Difficulty concentrating or making decisions? Malvin Johns  Walking or climbing stairs? Y Y  Dressing or bathing? Y Y  Doing errands, shopping? Malvin Johns  Preparing Food and eating ? Y Y  Using the Toilet? N N  In the past six months, have you accidently leaked urine? Y Y  Do you have problems with loss of bowel control? N N  Managing your Medications? Y Y  Managing your Finances? Malvin Johns  Housekeeping or managing your Housekeeping? Y Y  Some recent data might be hidden    Fall/Depression Screening: PHQ 2/9 Scores 11/06/2016 11/01/2016  PHQ - 2 Score 0 2  PHQ- 9 Score - 4    Assessment:  Initial assessment was completed at home visit today. Patient alert and oriented, but drowsy for most of visit.  Patient's non-fasting blood glucose today was 269. Patient reported that her last A1C was 12 and that her goal was 6 or below. Patient's son, Selena Batten reported that their main concern was that she was not eating properly to control her diabetes. He reported that his brother, [name] who lives in the home assists with purchasing groceries and that her favorite food is grapes. He reported that they do  keep grapes in the fridge, but are unable to control her portion sizes and she will go to the fridge and snack all day. RNCM provided education to patient and her son, Selena Batten regarding diabetic diet and how to read food labels to monitor carbohydrates. Educated that it is important to reivew patient's blood glucose following eating grapes. Educated to check her sugar 1-2 hours after eating grapes to determine how they are affecting her blood sugar. Selena Batten also reported that they buy her little cakes. Educated on plate method to help control diet and that she needs to incorporate more protein and vegetables  throughout the day. Patient reported that she does not typically eat 3 large meals a day, but eats about every 3 hours. RNCM encouraged to continue to do this, but to incorporate a protein and a complex carbohydrate, for example, when eating grapes, to also eat a protein, like cheese or peanut butter. Encouraged a snack of whole wheat crackers and peanut better vs. Eating only grapes all day. Patient was agreeable to this and said she could eat more variety. Patient's son stated they would make sure she had other items in the home to eat.  Plan: Will follow up with patient with home visit in a few weeks to continue diabetes education and support.  Turkey R. Kaine Mcquillen, RN, BSN, CCM [facility] Care Management Coordinator [phone]

## 2016-11-14 ENCOUNTER — Ambulatory Visit (INDEPENDENT_AMBULATORY_CARE_PROVIDER_SITE_OTHER): Payer: Medicare Other | Admitting: Internal Medicine

## 2016-11-14 ENCOUNTER — Encounter: Payer: Self-pay | Admitting: Internal Medicine

## 2016-11-14 ENCOUNTER — Other Ambulatory Visit (INDEPENDENT_AMBULATORY_CARE_PROVIDER_SITE_OTHER): Payer: Medicare Other

## 2016-11-14 VITALS — BP 102/62 | HR 75 | Ht 63.5 in | Wt 131.0 lb

## 2016-11-14 DIAGNOSIS — Z794 Long term (current) use of insulin: Secondary | ICD-10-CM

## 2016-11-14 DIAGNOSIS — E039 Hypothyroidism, unspecified: Secondary | ICD-10-CM

## 2016-11-14 DIAGNOSIS — I6529 Occlusion and stenosis of unspecified carotid artery: Secondary | ICD-10-CM

## 2016-11-14 DIAGNOSIS — E1140 Type 2 diabetes mellitus with diabetic neuropathy, unspecified: Secondary | ICD-10-CM | POA: Diagnosis not present

## 2016-11-14 LAB — TSH: TSH: <0.01 u[IU]/mL — ABNORMAL LOW (ref 0.35–4.50)

## 2016-11-14 LAB — T4, FREE: Free T4: 1.53 ng/dL (ref 0.60–1.60)

## 2016-11-14 MED ORDER — INSULIN ASPART PROT & ASPART (70-30 MIX) 100 UNIT/ML PEN: 12.0000 [IU] | PEN_INJECTOR | Freq: Two times a day (BID) | SUBCUTANEOUS | 5 refills | Status: DC

## 2016-11-14 NOTE — Patient Instructions (Addendum)
Please stop at [facility] lab.  If we need to restart Levothyroxine, we may need a lower dose.   Take the thyroid hormone every day, with water, at least 30 minutes before breakfast, separated by at least 4 hours from: - acid reflux medications - calcium - iron - multivitamins  Please stop Lantus and start NovoLog 70/30: - 12 units before a meal without dessert - 14 units before a meal with dessert Inject the 70/30 insulin 10-15 min before a meal.  Continue Glipizide 5 mg but move this 10-15 min before a meal.  Please come back for a follow-up appointment in 3 months.

## 2016-11-14 NOTE — Progress Notes (Signed)
Patient ID: [name], female   DOB: 11-11-1933, 83 y.o.   MRN: [id number]  HPI: [name] [name] is a 83 y.o.-year-old female, returning for f/u for DM2, dx in ~2006, insulin-dependent, uncontrolled, with complications (peripheral neuropathy, CAD, cerebrovascular ds - s/p stroke 12/2016).   Last visit 1 year and 2 mo ago.  DM2: Last hemoglobin A1c was: Lab Results  Component Value Date   HGBA1C 12.0 (H) 10/03/2016   HGBA1C 9.2 (H) 05/29/2016   HGBA1C 11.4 (H) 01/11/2016   Pt is on a regimen of: - Glipizide 5 mg 2x a day before meals - Lantus 18 units in am added 04/2015 - in am >> no missed doses She was on Metformin ER 750 mg 2x a day >> stopped b/c diarrhea.  Pt checks her sugars 1x a day - am: 90-147 >> 100-160 but occasionally 230-260 (eats during the night) - 2h after brunch: n/c >> 123, 189, 199 >> n/c - before lunch: n/c - 2h after lunch: n/c >> 226 >> n/c - before dinner: n/c - 2h after dinner: n/c - bedtime: n/c   - nighttime: n/c >> 120-150 >> n/c No lows. Lowest sugar was 140 >> 120 >> 89 >> 90. ? If she had hypoglycemia awareness.  Highest sugar was 300s >> 240 >> 269.  Glucometer: AccuChek  Pt's meals are: - Breakfast: cold or hot cereal + Glucerna - Lunch: small sandwich - Dinner: vegetables + meat + bread + Glucerna - Snacks: 1/2 banana  - no CKD, last BUN/creatinine:  Lab Results  Component Value Date   BUN 30 (H) 10/18/2016   CREATININE 1.40 (H) 10/18/2016   - last set of lipids: Lab Results  Component Value Date   CHOL 135 10/03/2016   HDL 23.80 (L) 10/03/2016   LDLCALC 152 (H) 01/11/2016   LDLDIRECT 49.0 10/03/2016   TRIG 326.0 (H) 10/03/2016   CHOLHDL 6 10/03/2016   - last eye exam was in 10/02/2015. No DR. + cataract OU >> implants. - + numbness and tingling in her feet. Has neuropathy. She is on Hydrocodone for neuropathic pain. This is the only thing that helps. Tried Lyrica and Neurontin >> did not help.  She also has  hypothyroidism: I noticed that her latest TSH was very high - noncompliance with LT4:  Lab Results  Component Value Date   TSH <0.01 (L) 10/03/2016   TSH 0.23 (L) 05/29/2016   TSH 0.31 (L) 01/10/2016   TSH 5.20 (H) 10/06/2015   TSH 5.60 (H) 06/27/2015   TSH 9.08 (H) 05/03/2015   FREET4 0.95 10/06/2015   FREET4 1.04 06/27/2015   FREET4 1.13 05/03/2015   She was taking LT4: - every day - forgets this once a mo - fasting.  - + PPI at night - no calcium - no iron - on MVI later in the day  ROS: Constitutional: + weight loss, no fatigue, + subjective hypothermia Eyes: no blurry vision, no xerophthalmia ENT: no sore throat, no nodules palpated in throat, no dysphagia/odynophagia, no hoarseness Cardiovascular: no CP/SOB/palpitations/leg swelling Respiratory: no cough/SOB Gastrointestinal: no N/V/D/[place] Musculoskeletal: no muscle/joint aches Skin: no rashes Neurological: + tremors/no numbness/tingling/dizziness  I reviewed pt's medications, allergies, PMH, social hx, family hx, and changes were documented in the history of present illness. Otherwise, unchanged from my initial visit note.  Past Medical History:  Diagnosis Date  . Chronic combined systolic and diastolic CHF, NYHA class 3 (HCC)    a. 03/2013 Echo: EF 25-30%, diff HK, sev antsept HK, mild MR.  Marland Kitchen  Congestive dilated cardiomyopathy (HCC)    a. 03/2013 Echo: EF 25-30%;  b. 09/2013 s/p SJM 3242 CRT-P.  Marland Kitchen Coronary artery disease    a. 08/2012 Cath: LM nl, LAD 73m, LCX nondom, mild-mod nonobs dzs mid, OM1 ok, OM2 90/90p, RCA 40, EF 25-30%-->Med Rx.  . GERD (gastroesophageal reflux disease)   . High cholesterol   . Hypertension   . Hypothyroidism   . Left bundle branch block   . Osteoarthritis   . Stroke (HCC)   . Type II diabetes mellitus (HCC)    Past Surgical History:  Procedure Laterality Date  . ABDOMINAL HYSTERECTOMY  1980  . BI-VENTRICULAR PACEMAKER INSERTION N/A 10/01/2013   Procedure: BI-VENTRICULAR  PACEMAKER INSERTION (CRT-P);  Surgeon: Duke Salvia, MD;  Location: [facility] CATH LAB;  Service: Cardiovascular;  Laterality: N/A;  . BI-VENTRICULAR PACEMAKER INSERTION (CRT-P)     a. 09/2013 s/p SJM 3242 CRT-P.  . Bilateral cateract surgery    . BMD  2006  . IR GENERIC HISTORICAL  10/18/2016   IR ANGIO INTRA EXTRACRAN SEL COM CAROTID INNOMINATE BILAT MOD SED 10/18/2016 Julieanne Cotton, MD [facility]-INTERV RAD  . IR GENERIC HISTORICAL  10/18/2016   IR ANGIO VERTEBRAL SEL SUBCLAVIAN INNOMINATE BILAT MOD SED 10/18/2016 Julieanne Cotton, MD [facility]INTER[facility]  . ORIF TIBIA PLATEAU Right 12/04/2013   Procedure: OPEN REDUCTION INTERNAL FIXATION (ORIF) TIBIAL PLATEAU;  Surgeon: Verlee Rossetti, MD;  Location: [facility] ORS;  Service: Orthopedics;  Laterality: Right;  . PERIPHERAL VASCULAR CATHETERIZATION N/A 06/17/2016   Procedure: Lower Extremity Angiography;  Surgeon: Runell Gess, MD;  Location: [facility] INVASIVE CV LAB;  Service: Cardiovascular;  Laterality: N/A;  . PERIPHERAL VASCULAR CATHETERIZATION Left 06/17/2016   Procedure: Peripheral Vascular Atherectomy;  Surgeon: Runell Gess, MD;  Location: [facility] INVASIVE CV LAB;  Service: Cardiovascular;  Laterality: Left;  Popliteal   . RIGHT HEART CATHETERIZATION N/A 09/08/2012   Procedure: RIGHT HEART CATH;  Surgeon: Dolores Patty, MD;  Location: [facility] CATH LAB;  Service: Cardiovascular;  Laterality: N/A;   Social History   Social History  . Marital status: Divorced    Spouse name: N/A  . Number of children: 4  . Years of education: N/A   Occupational History  . Retired Engineer, civil (consulting)    Social History Main Topics  . Smoking status: Former Games developer  . Smokeless tobacco: Former Neurosurgeon  . Alcohol use No  . Drug use: No  . Sexual activity: Not Currently   Other Topics Concern  . Not on file   Social History Narrative   Widowed.  Lives with her son.  Normally able to ambulate without assistance.  Quit smoking as a teenager.              Current Outpatient Prescriptions on  File Prior to Visit  Medication Sig Dispense Refill  . aspirin EC 325 MG tablet Take 325 mg by mouth daily.    . carvedilol (COREG) 12.5 MG tablet TAKE 1 TABLET TWICE A DAY WITH MEALS (Patient taking differently: Take 6.25 mgs twice daily with meals) 180 tablet 5  . clopidogrel (PLAVIX) 75 MG tablet Take 1 tablet (75 mg total) by mouth daily. 90 tablet 2  . co-enzyme Q-10 30 MG capsule Take 30 mg by mouth daily.    . furosemide (LASIX) 20 MG tablet Take 20 mg by mouth daily as needed for fluid.     Marland Kitchen glipiZIDE (GLUCOTROL) 10 MG tablet TAKE 1 TABLET TWICE A DAY BEFORE MEALS (Patient taking differently: Take 5  mg by mouth 2 (two) times daily before a meal. ) 180 tablet 1  . hydrOXYzine (ATARAX/VISTARIL) 25 MG tablet Take 1 tablet (25 mg total) by mouth 3 (three) times daily as needed. (Patient taking differently: Take 25 mg by mouth 3 (three) times daily as needed for itching. ) 60 tablet 3  . ibuprofen (ADVIL,MOTRIN) 200 MG tablet Take 400 mg by mouth daily as needed for headache or moderate pain.    . Insulin Glargine (LANTUS SOLOSTAR) 100 UNIT/ML Solostar Pen Inject 18 Units into the skin every morning. 15 mL 10  . lisinopril (PRINIVIL,ZESTRIL) 5 MG tablet Take 1 tablet (5 mg total) by mouth 2 (two) times daily. 180 tablet 3  . Omega-3 Fatty Acids (EQL OMEGA 3 FISH OIL) 1200 MG CAPS Take 1,200 mg by mouth daily.    . pantoprazole (PROTONIX) 40 MG tablet Take 1 tablet (40 mg total) by mouth daily. 30 tablet 6  . rosuvastatin (CRESTOR) 20 MG tablet Take 1 tablet (20 mg total) by mouth daily. 90 tablet 2  . Vitamin D, Ergocalciferol, (DRISDOL) 50000 units CAPS capsule TAKE 1 CAPSULE EVERY 7 DAYS ON MONDAYS 12 capsule 0   No current facility-administered medications on file prior to visit.    Allergies  Allergen Reactions  . Statins Other (See Comments)    Leg cramping  . Metformin And Related     Pt stopped it due to diarrhea  . Vytorin [Ezetimibe-Simvastatin] Other (See Comments)    Leg  cramps   Family History  Problem Relation Age of Onset  . Diabetes Mother   . Diabetes Other   . Hypertension Other   . Uterine cancer Other   . Heart attack Brother    PE: BP 102/62 (BP Location: Left Arm, Patient Position: Sitting)   Pulse 75   Ht 5' 3.5" (1.613 m)   Wt 131 lb (59.4 kg)   SpO2 99%   BMI 22.84 kg/m  Body mass index is 22.84 kg/m. Wt Readings from Last 3 Encounters:  11/14/16 131 lb (59.4 kg)  11/01/16 126 lb (57.2 kg)  10/18/16 130 lb (59 kg)   Constitutional: normal weight, in NAD Eyes: PERRLA, EOMI, no exophthalmos ENT: moist mucous membranes, no thyromegaly, no cervical lymphadenopathy Cardiovascular: RRR, No MRG Respiratory: CTA B Gastrointestinal: abdomen soft, NT, ND, BS+ Musculoskeletal: no deformities, strength intact in all 4 Skin: moist, warm, no rashes Neurological: + Mild tremor with outstretched hands, DTR normal in all 4  ASSESSMENT: 1. DM2, insulin-dependent, uncontrolled, with complications - Peripheral neuropathy - CAD - cerebrovascular ds - s/p stroke 12/2016  2. Hypothyroidism   PLAN:  1. Patient with long-standing, very uncontrolled diabetes, returning after a long absence. Recent HbA1c was 12%. She was noncompliant with meds, CBG checks in the past >> now mentions she is compliant with her insulin and her Glipizide. She now checks sugars only in am >> advised to check later also.  - For now, will change from Lantus to 70/30 insulin. Will continue Glipizide for now. - I suggested to:  Patient Instructions   Please stop Lantus and start NovoLog 70/30: - 12 units before a meal without dessert - 14 units before a meal with dessert Inject the 70/30 insulin 10-15 min before a meal.  Continue Glipizide 5 mg but move this 10-15 min before a meal.  Please come back for a follow-up appointment in 3 months.  - Strongly advised her to start checking sugars 2 times a day, rotating checks - given more  sugar logs - advised for yearly  eye exams >> she needs one - Return to clinic in 3 mo with sugar log   2. Hypothyroidism - h/o noncompliance with her daily levothyroxine dose  - at last visit, we increased her LT4 to 137 mcg daily. - at last check, TSH was <0.01 >> taken off LT4 - will check TFTs today and I suspect we need to restart LT4 at a lower dose. - Again advised her to take the thyroid hormone every day, with water, >30 minutes before breakfast, separated by >4 hours from acid reflux medications, calcium, iron, multivitamins.  - time spent with the patient: 40 min, of which >50% was spent in reviewing her previous labs, evaluations, and treatments, counseling her and her son about her conditions (please see the discussed topics above), and developing a plan to further investigate and treat them.  Appointment on 11/14/2016  Component Date Value Ref Range Status  . Free T4 11/14/2016 1.53  0.60 - 1.60 ng/dL Final   Comment: Specimens from patients who are undergoing biotin therapy and /or ingesting biotin supplements may contain high levels of biotin.  The higher biotin concentration in these specimens interferes with this Free T4 assay.  Specimens that contain high levels  of biotin may cause false high results for this Free T4 assay.  Please interpret results in light of the total clinical presentation of the patient.    Marland Kitchen TSH 11/14/2016 <0.01* 0.35 - 4.50 uIU/mL Final   TSH is again suppressed. We called patient back and she confirmed that, after locating her pillbox, she is actually still using levothyroxine 137 g daily. Will decrease the dose to 112 g daily and check labs again in 5 weeks.   Carlus Pavlov, MD PhD [facility]

## 2016-11-15 ENCOUNTER — Telehealth: Payer: Self-pay

## 2016-11-15 NOTE — Telephone Encounter (Signed)
-----   Message from Carlus Pavlov, MD sent at 11/14/2016  6:06 PM EDT ----- [name] [name], can you please call pt: Her thyroid tests are still very abnormal, showing that she has too much thyroid hormone her system. She still on levothyroxine? I did ask her about this at the time of the visit and she or her son were not clear about it. I did not see this on her medication list, but please have her check and see if she has it along with her other medicines and takes it daily. Please let me know.

## 2016-11-15 NOTE — Telephone Encounter (Signed)
Called and spoke with son, advised of the lab results. He stated they looked and she is taking the levothyroxine at this time. I advised I would call him back on what to do. No other questions.

## 2016-11-18 ENCOUNTER — Telehealth: Payer: Self-pay

## 2016-11-18 ENCOUNTER — Ambulatory Visit (INDEPENDENT_AMBULATORY_CARE_PROVIDER_SITE_OTHER): Payer: Medicare Other | Admitting: Podiatry

## 2016-11-18 DIAGNOSIS — M79609 Pain in unspecified limb: Secondary | ICD-10-CM

## 2016-11-18 DIAGNOSIS — B351 Tinea unguium: Secondary | ICD-10-CM

## 2016-11-18 DIAGNOSIS — Q828 Other specified congenital malformations of skin: Secondary | ICD-10-CM | POA: Diagnosis not present

## 2016-11-18 DIAGNOSIS — E1140 Type 2 diabetes mellitus with diabetic neuropathy, unspecified: Secondary | ICD-10-CM | POA: Diagnosis not present

## 2016-11-18 MED ORDER — LEVOTHYROXINE SODIUM 112 MCG PO TABS: 112.0000 ug | ORAL_TABLET | Freq: Every day | ORAL | 1 refills | Status: DC

## 2016-11-18 NOTE — Telephone Encounter (Signed)
Called and spoke with patients son of lab work. Patient son made lab appointment, and had no other issues or questions.

## 2016-11-18 NOTE — Progress Notes (Signed)
Subjective:     Patient ID: [name] [name], female   DOB: 08-04-1934, 83 y.o.   MRN: [id number]  HPI long-term diabetic presents with nail disease 1-5 both feet with incurvation and distal keratotic lesion digit 2 left it's painful and she cannot take care of   Review of Systems     Objective:   Physical Exam Neurovascular status unchanged with thick yellow brittle nailbeds 1-5 both feet and distal keratotic lesion digit 2 left it's painful    Assessment:     Chronic mycotic infection with at risk factors with pain and lesion formation second left    Plan:     H&P and diabetic education rendered and today I debrided nailbeds 1-5 both feet and lesion second toe left with no iatrogenic bleeding noted

## 2016-11-18 NOTE — Telephone Encounter (Signed)
-----   Message from Carlus Pavlov, MD sent at 11/18/2016  7:58 AM EDT ----- Raynelle Fanning, can you please call pt: TSH is still low. Please ask her to decrease the levothyroxine dose to 112 g daily and let's have her back to check labs again in 5 weeks. I sent the new dose to her pharmacy and ordered the labs.

## 2016-11-20 ENCOUNTER — Ambulatory Visit: Payer: Self-pay

## 2016-11-22 ENCOUNTER — Emergency Department (HOSPITAL_COMMUNITY): Payer: Medicare Other

## 2016-11-22 ENCOUNTER — Emergency Department (HOSPITAL_COMMUNITY)
Admission: EM | Admit: 2016-11-22 | Discharge: 2016-11-23 | Disposition: A | Discharge status: Home / Self Care | Payer: Medicare Other | Attending: Emergency Medicine | Admitting: Emergency Medicine

## 2016-11-22 ENCOUNTER — Encounter (HOSPITAL_COMMUNITY): Payer: Self-pay | Admitting: Obstetrics and Gynecology

## 2016-11-22 DIAGNOSIS — Y999 Unspecified external cause status: Secondary | ICD-10-CM | POA: Insufficient documentation

## 2016-11-22 DIAGNOSIS — I2510 Atherosclerotic heart disease of native coronary artery without angina pectoris: Secondary | ICD-10-CM | POA: Insufficient documentation

## 2016-11-22 DIAGNOSIS — E1140 Type 2 diabetes mellitus with diabetic neuropathy, unspecified: Secondary | ICD-10-CM | POA: Diagnosis not present

## 2016-11-22 DIAGNOSIS — Z8673 Personal history of transient ischemic attack (TIA), and cerebral infarction without residual deficits: Secondary | ICD-10-CM | POA: Insufficient documentation

## 2016-11-22 DIAGNOSIS — Z955 Presence of coronary angioplasty implant and graft: Secondary | ICD-10-CM | POA: Insufficient documentation

## 2016-11-22 DIAGNOSIS — Y939 Activity, unspecified: Secondary | ICD-10-CM | POA: Insufficient documentation

## 2016-11-22 DIAGNOSIS — S0101XA Laceration without foreign body of scalp, initial encounter: Secondary | ICD-10-CM | POA: Diagnosis not present

## 2016-11-22 DIAGNOSIS — I5042 Chronic combined systolic (congestive) and diastolic (congestive) heart failure: Secondary | ICD-10-CM | POA: Diagnosis not present

## 2016-11-22 DIAGNOSIS — Z79899 Other long term (current) drug therapy: Secondary | ICD-10-CM | POA: Insufficient documentation

## 2016-11-22 DIAGNOSIS — I110 Hypertensive heart disease with heart failure: Secondary | ICD-10-CM | POA: Diagnosis not present

## 2016-11-22 DIAGNOSIS — Z7982 Long term (current) use of aspirin: Secondary | ICD-10-CM | POA: Insufficient documentation

## 2016-11-22 DIAGNOSIS — S0990XA Unspecified injury of head, initial encounter: Secondary | ICD-10-CM | POA: Diagnosis present

## 2016-11-22 DIAGNOSIS — Y92001 Dining room of unspecified non-institutional (private) residence as the place of occurrence of the external cause: Secondary | ICD-10-CM | POA: Insufficient documentation

## 2016-11-22 DIAGNOSIS — Z794 Long term (current) use of insulin: Secondary | ICD-10-CM | POA: Insufficient documentation

## 2016-11-22 DIAGNOSIS — Y92009 Unspecified place in unspecified non-institutional (private) residence as the place of occurrence of the external cause: Secondary | ICD-10-CM

## 2016-11-22 DIAGNOSIS — Z87891 Personal history of nicotine dependence: Secondary | ICD-10-CM | POA: Insufficient documentation

## 2016-11-22 DIAGNOSIS — S0191XA Laceration without foreign body of unspecified part of head, initial encounter: Secondary | ICD-10-CM | POA: Diagnosis not present

## 2016-11-22 DIAGNOSIS — W19XXXA Unspecified fall, initial encounter: Secondary | ICD-10-CM

## 2016-11-22 DIAGNOSIS — Z950 Presence of cardiac pacemaker: Secondary | ICD-10-CM | POA: Diagnosis not present

## 2016-11-22 DIAGNOSIS — W01190A Fall on same level from slipping, tripping and stumbling with subsequent striking against furniture, initial encounter: Secondary | ICD-10-CM | POA: Insufficient documentation

## 2016-11-22 DIAGNOSIS — E039 Hypothyroidism, unspecified: Secondary | ICD-10-CM | POA: Diagnosis not present

## 2016-11-22 NOTE — ED Triage Notes (Addendum)
Pt presents to the ED post fall. Pt fell in the dining room and hit her head. Approx. 2 in laceration on the back left side of patients head. Not currently bleeding. Patient reports taking plavix. Denies LOC.

## 2016-11-23 DIAGNOSIS — S0101XA Laceration without foreign body of scalp, initial encounter: Secondary | ICD-10-CM | POA: Diagnosis not present

## 2016-11-23 MED ORDER — LIDOCAINE HCL (PF) 1 % IJ SOLN
5.0000 mL | Freq: Once | INTRAMUSCULAR | Status: AC
  Administered 2016-11-23: 5 mL
  Filled 2016-11-23: qty 30

## 2016-11-23 NOTE — ED Provider Notes (Signed)
[facility]-EMERGENCY DEPT Provider Note   CSN: [id number] Arrival date & time: 11/22/16  2131    By signing my name below, I, [name] [name], attest that this documentation has been prepared under the direction and in the presence of Benjiman Core, MD. Electronically Signed: Valentino [name], ED Scribe. 11/23/16. 12:16 AM.  History   Chief Complaint Chief Complaint  Patient presents with  . Fall   HPI HPI Comments: [name] [name] is a 83 y.o. female with PMHx of stroke, osteoarthritis, HTN, Type II diabetes, who presents to the Emergency Department complaining of 5/10, sudden onset, constant, head pain s/p mechanical fall that occurred PTA. She reports falling and striking her head on a nearby "marble-top" coffee table. Pt denies LOC. She reports associated laceration to the back of her head with mild bleeding. Bleeding was brought under control PTA. Pt states her pain is worsened with direct pressure. No alleviating factors noted. Per pt's son, he notes pt had a stroke last year and has since been " a bit unsteady". Per nurse note, pt is currently on Plavix. Pt denies any additional complaints.   Past Medical History:  Diagnosis Date  . Chronic combined systolic and diastolic CHF, NYHA class 3 (HCC)    a. 03/2013 Echo: EF 25-30%, diff HK, sev antsept HK, mild MR.  . Congestive dilated cardiomyopathy (HCC)    a. 03/2013 Echo: EF 25-30%;  b. 09/2013 s/p SJM 3242 CRT-P.  Marland Kitchen Coronary artery disease    a. 08/2012 Cath: LM nl, LAD 60m, LCX nondom, mild-mod nonobs dzs mid, OM1 ok, OM2 90/90p, RCA 40, EF 25-30%-->Med Rx.  . GERD (gastroesophageal reflux disease)   . High cholesterol   . Hypertension   . Hypothyroidism   . Left bundle branch block   . Osteoarthritis   . Stroke (HCC)   . Type II diabetes mellitus [facility])     Patient Active Problem List   Diagnosis Date Noted  . Critical lower limb ischemia 06/17/2016  . Diabetic neuropathy, painful (HCC) 04/24/2016  . Left carotid artery  stenosis   . Hypertriglyceridemia, essential 01/10/2016  . 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 Type 2 diabetes mellitus with diabetic neuropathy, with long-term current use of insulin (HCC) 10/06/2015  . Deficiency anemia 03/10/2014  . Osteoporosis, unspecified 03/10/2014  . Left bundle branch block 07/01/2013  . Coronary atherosclerosis of native coronary artery 02/16/2013  . Chronic systolic heart failure (HCC) 08/16/2012  . Congestive dilated cardiomyopathy (HCC) 08/02/2012  . HTN (hypertension) 08/02/2012  . Familial tremor 11/29/2010  . VITAMIN D DEFICIENCY 04/04/2010  . Osteoarthrosis, unspecified whether generalized or localized, unspecified site 04/04/2010  . Hyperlipidemia with target LDL less than 70 01/19/2007  . Hypothyroidism 01/14/2007    Past Surgical History:  Procedure Laterality Date  . ABDOMINAL HYSTERECTOMY  1980  . BI-VENTRICULAR PACEMAKER INSERTION N/A 10/01/2013   Procedure: BI-VENTRICULAR PACEMAKER INSERTION (CRT-P);  Surgeon: Duke Salvia, MD;  Location: [facility] CATH LAB;  Service: Cardiovascular;  Laterality: N/A;  . BI-VENTRICULAR PACEMAKER INSERTION (CRT-P)     a. 09/2013 s/p SJM 3242 CRT-P.  . Bilateral cateract surgery    . BMD  2006  . IR GENERIC HISTORICAL  10/18/2016   IR ANGIO INTRA EXTRACRAN SEL COM CAROTID INNOMINATE BILAT MOD SED 10/18/2016 Julieanne Cotton, MD [facility]-INTERV RAD  . IR GENERIC HISTORICAL  10/18/2016   IR ANGIO VERTEBRAL SEL SUBCLAVIAN INNOMINATE BILAT MOD SED 10/18/2016 Julieanne Cotton, MD [facility]-INTERV RAD  . ORIF TIBIA  PLATEAU Right 12/04/2013   Procedure: OPEN REDUCTION INTERNAL FIXATION (ORIF) TIBIAL PLATEAU;  Surgeon: Verlee Rossetti, MD;  Location: [facility] ORS;  Service: Orthopedics;  Laterality: Right;  . PERIPHERAL VASCULAR CATHETERIZATION N/A 06/17/2016   Procedure: Lower Extremity Angiography;  Surgeon: Runell Gess, MD;  Location: [facility] INVASIVE CV LAB;  Service: Cardiovascular;   Laterality: N/A;  . PERIPHERAL VASCULAR CATHETERIZATION Left 06/17/2016   Procedure: Peripheral Vascular Atherectomy;  Surgeon: Runell Gess, MD;  Location: [facility] INVASIVE CV LAB;  Service: Cardiovascular;  Laterality: Left;  Popliteal   . RIGHT HEART CATHETERIZATION N/A 09/08/2012   Procedure: RIGHT HEART CATH;  Surgeon: Dolores Patty, MD;  Location: [facility] CATH LAB;  Service: Cardiovascular;  Laterality: N/A;    OB History    No data available       Home Medications    Prior to Admission medications   Medication Sig Start Date End Date Taking? Authorizing Provider  aspirin EC 325 MG tablet Take 325 mg by mouth daily.    Historical Provider, MD  carvedilol (COREG) 12.5 MG tablet TAKE 1 TABLET TWICE A DAY WITH MEALS Patient taking differently: Take 6.25 mgs twice daily with meals    Aundria Rud, NP  clopidogrel (PLAVIX) 75 MG tablet Take 1 tablet (75 mg total) by mouth daily. 04/09/16   [name], MD  co-enzyme Q-10 30 MG capsule Take 30 mg by mouth daily.    Historical Provider, MD  furosemide (LASIX) 20 MG tablet Take 20 mg by mouth daily as needed for fluid.     Historical Provider, MD  glipiZIDE (GLUCOTROL) 10 MG tablet TAKE 1 TABLET TWICE A DAY BEFORE MEALS Patient taking differently: Take 5 mg by mouth 2 (two) times daily before a meal.  01/25/16   [name], MD  hydrOXYzine (ATARAX/VISTARIL) 25 MG tablet Take 1 tablet (25 mg total) by mouth 3 (three) times daily as needed. Patient taking differently: Take 25 mg by mouth 3 (three) times daily as needed for itching.  04/24/16   [name], MD  ibuprofen (ADVIL,MOTRIN) 200 MG tablet Take 400 mg by mouth daily as needed for headache or moderate pain.    Historical Provider, MD  insulin aspart protamine - aspart (NOVOLOG MIX 70/30 FLEXPEN) (70-30) 100 UNIT/ML FlexPen Inject 0.12-0.14 mLs (12-14 Units total) into the skin 2 (two) times daily before a meal. 11/14/16   Carlus Pavlov, MD  levothyroxine (SYNTHROID,  LEVOTHROID) 112 MCG tablet Take 1 tablet (112 mcg total) by mouth daily. 11/18/16   Carlus Pavlov, MD  lisinopril (PRINIVIL,ZESTRIL) 5 MG tablet Take 1 tablet (5 mg total) by mouth 2 (two) times daily. 01/16/16   [name], MD  Omega-3 Fatty Acids (EQL OMEGA 3 FISH OIL) 1200 MG CAPS Take 1,200 mg by mouth daily.    Historical Provider, MD  pantoprazole (PROTONIX) 40 MG tablet Take 1 tablet (40 mg total) by mouth daily. 06/18/16   Arty Baumgartner, NP  rosuvastatin (CRESTOR) 20 MG tablet Take 1 tablet (20 mg total) by mouth daily. 04/09/16   [name], MD  Vitamin D, Ergocalciferol, (DRISDOL) 50000 units CAPS capsule TAKE 1 CAPSULE EVERY 7 DAYS ON MONDAYS 01/16/16   [name], MD    Family History Family History  Problem Relation Age of Onset  . Diabetes Mother   . Diabetes Other   . Hypertension Other   . Uterine cancer Other   . Heart attack Brother     Social  History Social History  Substance Use Topics  . Smoking status: Former Games developer  . Smokeless tobacco: Former Neurosurgeon  . Alcohol use No     Allergies   Statins; Metformin and related; and Vytorin [ezetimibe-simvastatin]   Review of Systems Review of Systems  Constitutional: Negative for appetite change.  HENT: Negative for congestion.   Respiratory: Negative for shortness of breath.   Cardiovascular: Negative for chest pain.  Gastrointestinal: Negative for abdominal pain.  Genitourinary: Negative for dyspareunia.  Musculoskeletal: Positive for myalgias (head).  Skin: Positive for wound (back of head).       +bleeding  Neurological: Negative for syncope.  Psychiatric/Behavioral: Negative for confusion.  All other systems reviewed and are negative.    Physical Exam Updated Vital Signs BP 131/71 (BP Location: Left Arm)   Pulse 69   Temp 97.7 F (36.5 C) (Oral)   Resp 14   Ht 5\' 5"  (1.651 m)   Wt 130 lb (59 kg)   SpO2 100%   BMI 21.63 kg/m   Physical Exam  Constitutional: She appears  well-developed and well-nourished.   Awake and appropriate.   HENT:  Head: Normocephalic and atraumatic.  Eyes: Conjunctivae are normal. Right eye exhibits no discharge. Left eye exhibits no discharge.  Pulmonary/Chest: Effort normal. No respiratory distress.  Musculoskeletal: She exhibits no tenderness.  3 laceration to occipital of head. No underling bony tenderness. No CVA tenderness. Face is non tender. No tenderness to shoulders and elbows. No lumbar tenderness.  Neurological: She is alert. Coordination normal.  Skin: Skin is warm and dry. No rash noted. She is not diaphoretic. No erythema.  Psychiatric: She has a normal mood and affect.  Nursing note and vitals reviewed.    ED Treatments / Results   DIAGNOSTIC STUDIES: Oxygen Saturation is 98% on RA, normal by my interpretation.    COORDINATION OF CARE: 12:11 AM Discussed treatment plan with pt at bedside which includes CT head imaging and pt agreed to plan.   Labs (all labs ordered are listed, but only abnormal results are displayed) Labs Reviewed - No data to display  EKG  EKG Interpretation None       Radiology Ct Head Wo Contrast  Result Date: 11/22/2016 CLINICAL DATA:  Larey Seat in dining room, posterior head laceration. No loss of consciousness. On Plavix. History of hypertension, hyperlipidemia, diabetes and stroke. EXAM: CT HEAD WITHOUT CONTRAST TECHNIQUE: Contiguous axial images were obtained from the base of the skull through the vertex without intravenous contrast. COMPARISON:  CT HEAD January 15, 2016 FINDINGS: BRAIN: No intraparenchymal hemorrhage, mass effect nor midline shift. RIGHT temporal occipital lobe encephalomalacia with ex vacuo dilatation RIGHT occipital horn and temporal horn, progressed from prior examination. No hydrocephalus. Patchy supratentorial white matter hypodensities less than expected for patient's age, though non-specific are most compatible with chronic small vessel ischemic disease. No acute  large vascular territory infarcts. No abnormal extra-axial fluid collections. Basal cisterns are patent. VASCULAR: Moderate calcific atherosclerosis of the carotid siphons. SKULL: No skull fracture. Osteopenia. Moderate LEFT parietal scalp hematoma without subcutaneous gas or radiopaque foreign bodies. Overlying bandage. SINUSES/ORBITS: Minimal RIGHT posterior ethmoid air cell mucosal thickening without paranasal sinus air-fluid levels. Imaged mastoid air cells are well aerated. The included ocular globes and orbital contents are non-suspicious. OTHER: None. IMPRESSION: No acute intracranial process. Moderate LEFT posterior scalp hematoma without skull fracture. Chronic changes include old large RIGHT PCA territory infarct. Electronically Signed   By: Awilda Metro M.D.   On: 11/22/2016 22:47  Procedures Procedures (including critical care time)  Medications Ordered in ED Medications  lidocaine (PF) (XYLOCAINE) 1 % injection 5 mL (5 mLs Infiltration Given 11/23/16 0036)     Initial Impression / Assessment and Plan / ED Course  I have reviewed the triage vital signs and the nursing notes.  Pertinent labs & imaging results that were available during my care of the patient were reviewed by me and considered in my medical decision making (see chart for details).     Patient with fall and scalp laceration. Head CT reassuring. Mechanical fall. Left closed patient discharged home. LACERATION REPAIR Performed by: Billee Cashing Authorized by: Billee Cashing Consent: Verbal consent obtained. Risks and benefits: risks, benefits and alternatives were discussed Consent given by: patient Patient identity confirmed: provided demographic data Prepped and Draped in normal sterile fashion Wound explored  Laceration Location: scalp  Laceration Length: 3cm  No Foreign Bodies seen or palpated  Anesthesia: local infiltration  Local anesthetic: lidocaine 1%   Anesthetic total: 2  ml  Irrigation method: syringe Amount of cleaning: standard  Skin closure: 4-0 proline  Number of sutures: 5  Technique: simple interupted  Patient tolerance: Patient tolerated the procedure well with no immediate complications.     Final Clinical Impressions(s) / ED Diagnoses   Final diagnoses:  Fall in home, initial encounter  Laceration of scalp, initial encounter    New Prescriptions Discharge Medication List as of 11/23/2016  2:42 AM     I personally performed the services described in this documentation, which was scribed in my presence. The recorded information has been reviewed and is accurate.       Benjiman Core, MD 11/23/16 [phone]

## 2016-11-25 ENCOUNTER — Other Ambulatory Visit: Payer: Self-pay

## 2016-12-13 ENCOUNTER — Other Ambulatory Visit: Payer: Self-pay
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
## 2016-12-16 ENCOUNTER — Encounter: Payer: Self-pay | Admitting: Internal Medicine

## 2016-12-16 ENCOUNTER — Ambulatory Visit (INDEPENDENT_AMBULATORY_CARE_PROVIDER_SITE_OTHER): Payer: Medicare Other | Admitting: Internal Medicine

## 2016-12-16 VITALS — BP 144/72 | HR 65 | Temp 97.9°F | Resp 16 | Ht 63.5 in | Wt 133.2 lb

## 2016-12-16 DIAGNOSIS — Z5189 Encounter for other specified aftercare: Secondary | ICD-10-CM

## 2016-12-16 DIAGNOSIS — I6529 Occlusion and stenosis of unspecified carotid artery: Secondary | ICD-10-CM

## 2016-12-16 NOTE — Progress Notes (Signed)
Subjective:  Patient ID: [name] [name], female    DOB: Oct 23, 1933  Age: 83 y.o. MRN: [id number]  CC: Wound Check   HPI Riya Caddell presents for suture removal - she fell about 2 weeks ago and sustained a laceration over the left posterior scalp, she feels well today and offers no complaints other than wanting the sutures rermoved.  Outpatient Medications Prior to Visit  Medication Sig Dispense Refill  . aspirin EC 325 MG tablet Take 325 mg by mouth daily.    . carvedilol (COREG) 12.5 MG tablet TAKE 1 TABLET TWICE A DAY WITH MEALS (Patient taking differently: Take 6.25 mgs twice daily with meals) 180 tablet 5  . clopidogrel (PLAVIX) 75 MG tablet Take 1 tablet (75 mg total) by mouth daily. 90 tablet 2  . co-enzyme Q-10 30 MG capsule Take 30 mg by mouth daily.    . furosemide (LASIX) 20 MG tablet Take 20 mg by mouth daily as needed for fluid.     Marland Kitchen glipiZIDE (GLUCOTROL) 10 MG tablet TAKE 1 TABLET TWICE A DAY BEFORE MEALS (Patient taking differently: Take 5 mg by mouth 2 (two) times daily before a meal. ) 180 tablet 1  . hydrOXYzine (ATARAX/VISTARIL) 25 MG tablet Take 1 tablet (25 mg total) by mouth 3 (three) times daily as needed. (Patient taking differently: Take 25 mg by mouth 3 (three) times daily as needed for itching. ) 60 tablet 3  . ibuprofen (ADVIL,MOTRIN) 200 MG tablet Take 400 mg by mouth daily as needed for headache or moderate pain.    Marland Kitchen insulin aspart protamine - aspart (NOVOLOG MIX 70/30 FLEXPEN) (70-30) 100 UNIT/ML FlexPen Inject 0.12-0.14 mLs (12-14 Units total) into the skin 2 (two) times daily before a meal. 15 mL 5  . levothyroxine (SYNTHROID, LEVOTHROID) 112 MCG tablet Take 1 tablet (112 mcg total) by mouth daily. 45 tablet 1  . lisinopril (PRINIVIL,ZESTRIL) 5 MG tablet Take 1 tablet (5 mg total) by mouth 2 (two) times daily. 180 tablet 3  . Omega-3 Fatty Acids (EQL OMEGA 3 FISH OIL) 1200 MG CAPS Take 1,200 mg by mouth daily.    . pantoprazole (PROTONIX) 40 MG tablet Take  1 tablet (40 mg total) by mouth daily. 30 tablet 6  . rosuvastatin (CRESTOR) 20 MG tablet Take 1 tablet (20 mg total) by mouth daily. 90 tablet 2  . Vitamin D, Ergocalciferol, (DRISDOL) 50000 units CAPS capsule TAKE 1 CAPSULE EVERY 7 DAYS ON MONDAYS 12 capsule 0   No facility-administered medications prior to visit.     ROS Review of Systems  All other systems reviewed and are negative.   Objective:  BP (!) 144/72 (BP Location: Left Arm, Patient Position: Sitting, Cuff Size: Normal)   Pulse 65   Temp 97.9 F (36.6 C) (Oral)   Resp 16   Ht 5' 3.5" (1.613 m)   Wt 133 lb 4 oz (60.4 kg)   SpO2 98%   BMI 23.23 kg/m   BP Readings from Last 3 Encounters:  12/16/16 (!) 144/72  11/23/16 131/71  11/14/16 102/62    Wt Readings from Last 3 Encounters:  12/16/16 133 lb 4 oz (60.4 kg)  11/22/16 130 lb (59 kg)  11/14/16 131 lb (59.4 kg)    Physical Exam  HENT:  Head:      Lab Results  Component Value Date   WBC 6.9 10/18/2016   HGB 13.6 10/18/2016   HCT 40.0 10/18/2016   PLT 224 10/18/2016   GLUCOSE 203 (H) 10/18/2016  CHOL 135 10/03/2016   TRIG 326.0 (H) 10/03/2016   HDL 23.80 (L) 10/03/2016   LDLDIRECT 49.0 10/03/2016   LDLCALC 152 (H) 01/11/2016   ALT 26 10/03/2016   AST 30 10/03/2016   NA 141 10/18/2016   K 3.9 10/18/2016   CL 93 (L) 10/18/2016   CREATININE 1.40 (H) 10/18/2016   BUN 30 (H) 10/18/2016   CO2 24 10/18/2016   TSH <0.01 (L) 11/14/2016   INR 1.07 10/18/2016   HGBA1C 12.0 (H) 10/03/2016   MICROALBUR 3.6 (H) 01/10/2016    Ct Head Wo Contrast  Result Date: 11/22/2016 CLINICAL DATA:  Larey Seat in dining room, posterior head laceration. No loss of consciousness. On Plavix. History of hypertension, hyperlipidemia, diabetes and stroke. EXAM: CT HEAD WITHOUT CONTRAST TECHNIQUE: Contiguous axial images were obtained from the base of the skull through the vertex without intravenous contrast. COMPARISON:  CT HEAD January 15, 2016 FINDINGS: BRAIN: No  intraparenchymal hemorrhage, mass effect nor midline shift. RIGHT temporal occipital lobe encephalomalacia with ex vacuo dilatation RIGHT occipital horn and temporal horn, progressed from prior examination. No hydrocephalus. Patchy supratentorial white matter hypodensities less than expected for patient's age, though non-specific are most compatible with chronic small vessel ischemic disease. No acute large vascular territory infarcts. No abnormal extra-axial fluid collections. Basal cisterns are patent. VASCULAR: Moderate calcific atherosclerosis of the carotid siphons. SKULL: No skull fracture. Osteopenia. Moderate LEFT parietal scalp hematoma without subcutaneous gas or radiopaque foreign bodies. Overlying bandage. SINUSES/ORBITS: Minimal RIGHT posterior ethmoid air cell mucosal thickening without paranasal sinus air-fluid levels. Imaged mastoid air cells are well aerated. The included ocular globes and orbital contents are non-suspicious. OTHER: None. IMPRESSION: No acute intracranial process. Moderate LEFT posterior scalp hematoma without skull fracture. Chronic changes include old large RIGHT PCA territory infarct. Electronically Signed   By: Awilda Metro M.D.   On: 11/22/2016 22:47    Assessment & Plan:   Anely was seen today for wound check.  Diagnoses and all orders for this visit:  Encounter for post-traumatic wound check- Sutures removed, no infection or complications noted.   I am having Ms. Oxendine maintain her carvedilol, furosemide, lisinopril, Vitamin D (Ergocalciferol), glipiZIDE, rosuvastatin, clopidogrel, hydrOXYzine, pantoprazole, aspirin EC, co-enzyme Q-10, EQL OMEGA 3 FISH OIL, ibuprofen, insulin aspart protamine - aspart, and levothyroxine.  No orders of the defined types were placed in this encounter.    Follow-up: Return if symptoms worsen or fail to improve.  Sanda Linger, MD

## 2016-12-16 NOTE — Progress Notes (Signed)
Pre visit review using our clinic review tool, if applicable. No additional management support is needed unless otherwise documented below in the visit note. 

## 2016-12-16 NOTE — Patient Instructions (Signed)

## 2016-12-17 ENCOUNTER — Other Ambulatory Visit: Payer: Self-pay

## 2016-12-17 NOTE — Patient Outreach (Signed)
[facility]) Care Management  [facility] Care Manager  12/17/2016   [name] [name] Jun 28, 1934 [id number]  Subjective: Home visit completed with patient and her sons, [name] [name] and [name] [name].   Objective: Data from glucometer 7 day average 268 14 day average 250 30 day average 226  Encounter Medications:  Outpatient Encounter Prescriptions as of 12/17/2016  Medication Sig  . aspirin EC 325 MG tablet Take 325 mg by mouth daily.  . carvedilol (COREG) 12.5 MG tablet TAKE 1 TABLET TWICE A DAY WITH MEALS (Patient taking differently: Take 6.25 mgs twice daily with meals)  . clopidogrel (PLAVIX) 75 MG tablet Take 1 tablet (75 mg total) by mouth daily.  Marland Kitchen co-enzyme Q-10 30 MG capsule Take 30 mg by mouth daily.  . furosemide (LASIX) 20 MG tablet Take 20 mg by mouth daily as needed for fluid.   Marland Kitchen glipiZIDE (GLUCOTROL) 10 MG tablet TAKE 1 TABLET TWICE A DAY BEFORE MEALS (Patient taking differently: Take 5 mg by mouth 2 (two) times daily before a meal. )  . hydrOXYzine (ATARAX/VISTARIL) 25 MG tablet Take 1 tablet (25 mg total) by mouth 3 (three) times daily as needed. (Patient taking differently: Take 25 mg by mouth 3 (three) times daily as needed for itching. )  . ibuprofen (ADVIL,MOTRIN) 200 MG tablet Take 400 mg by mouth daily as needed for headache or moderate pain.  Marland Kitchen insulin aspart protamine - aspart (NOVOLOG MIX 70/30 FLEXPEN) (70-30) 100 UNIT/ML FlexPen Inject 0.12-0.14 mLs (12-14 Units total) into the skin 2 (two) times daily before a meal.  . levothyroxine (SYNTHROID, LEVOTHROID) 112 MCG tablet Take 1 tablet (112 mcg total) by mouth daily.  Marland Kitchen lisinopril (PRINIVIL,ZESTRIL) 5 MG tablet Take 1 tablet (5 mg total) by mouth 2 (two) times daily.  . Omega-3 Fatty Acids (EQL OMEGA 3 FISH OIL) 1200 MG CAPS Take 1,200 mg by mouth daily.  . pantoprazole (PROTONIX) 40 MG tablet Take 1 tablet (40 mg total) by mouth daily.  . rosuvastatin (CRESTOR) 20 MG tablet Take 1 tablet (20 mg total) by  mouth daily.  . Vitamin D, Ergocalciferol, (DRISDOL) 50000 units CAPS capsule TAKE 1 CAPSULE EVERY 7 DAYS ON MONDAYS   No facility-administered encounter medications on file as of 12/17/2016.     Functional Status:  In your present state of health, do you have any difficulty performing the following activities: 11/06/2016 11/01/2016  Hearing? Malvin Johns  Vision? Y Y  Difficulty concentrating or making decisions? Malvin Johns  Walking or climbing stairs? Y Y  Dressing or bathing? Y Y  Doing errands, shopping? Malvin Johns  Preparing Food and eating ? Y Y  Using the Toilet? N N  In the past six months, have you accidently leaked urine? Y Y  Do you have problems with loss of bowel control? N N  Managing your Medications? Y Y  Managing your Finances? Malvin Johns  Housekeeping or managing your Housekeeping? Y Y  Some recent data might be hidden    Fall/Depression Screening: Fall Risk  11/06/2016 11/01/2016 08/27/2016  Falls in the past year? Yes Yes Yes  Number falls in past yr: 2 or more 2 or more 2 or more  Injury with Fall? Yes Yes No  Risk Factor Category  High Fall Risk High Fall Risk -  Risk for fall due to : Impaired mobility;Impaired balance/gait;History of fall(s) Impaired mobility;Impaired vision;Impaired balance/gait -  Follow up Falls evaluation completed;Education provided;Falls prevention discussed Falls prevention discussed;Education provided -   [facility] 2/9 Scores  11/06/2016 11/01/2016  PHQ - 2 Score 0 2  PHQ- 9 Score - 4    Assessment: RNCM was unable to follow up with patient as previously planned due to son being involved in storm clean up following a tornado in town. Was able to schedule appointment for home visit today.   Patient reported that she has been doing better with diet. Patient and sons indicated that she has been eating less grapes, but is now eating oranges. RNCM provided education about portions and encouraged to eat 1/2 an orange if she realizes that this is raising her blood sugar levels.  Also reminded to be sure she is adding protein to her small meals and provided examples. RNCM provided the book, Living Well With Diabetes and reviewed contents of book. Provided education on disease process and how nutrition and activity can impact blood sugar. Demonstrated how to read food labels, reviewed the plate method, and discussed sugars found in foods. Encouraged to limit processed foods.  Patient is currently not exercising. She does have a floor pedal that she uses "occasionally." Encouraged patient to add some activity, such as sitting and pedaling several times a day. Also encouraged to do some exercises in her chair.   Patient did have a fall recently and hit her head on the coffee table. Patient reported that she had gotten up and fell over. She denies loss of consciousness, but did have lacerations on her head that required sutures in the ED. Her son [name] [name] was at home at the time of the fall and stated that he had just stepped outside for a minute and she was getting up to see if he was leaving. He came in and saw she had fallen and was bleeding and he drove her to the emergency room. She has not had any complaints since the fall and had her sutures removed already. Wounds to scalp have healed with no signs or symptoms of infection.   Plan: Will follow up with patient and family within next month to evaluate blood sugar readings, diet and continue education and support.  [place] R. Lyndie Vanderloop, RN, BSN, CCM [facility] Care Management Coordinator [phone]

## 2016-12-23 ENCOUNTER — Other Ambulatory Visit (INDEPENDENT_AMBULATORY_CARE_PROVIDER_SITE_OTHER): Payer: Medicare Other

## 2016-12-23 DIAGNOSIS — I6300 Cerebral infarction due to thrombosis of unspecified precerebral artery: Secondary | ICD-10-CM

## 2016-12-23 DIAGNOSIS — E039 Hypothyroidism, unspecified: Secondary | ICD-10-CM

## 2016-12-23 LAB — T4, FREE: FREE T4: 1.42 ng/dL (ref 0.60–1.60)

## 2016-12-23 LAB — TSH

## 2016-12-24 ENCOUNTER — Other Ambulatory Visit: Payer: Self-pay | Admitting: Internal Medicine

## 2016-12-24 ENCOUNTER — Telehealth: Payer: Self-pay

## 2016-12-24 MED ORDER — LEVOTHYROXINE SODIUM 88 MCG PO TABS: 88.0000 ug | ORAL_TABLET | Freq: Every day | ORAL | 0 refills | Status: DC

## 2016-12-24 NOTE — Telephone Encounter (Signed)
Called and LVM advising patient to call back for labs. New RX was sent in.

## 2016-12-24 NOTE — Telephone Encounter (Signed)
-----   Message from Carlus Pavlov, MD sent at 12/24/2016 12:50 PM EDT ----- [name] [name], can you please call pt: Her thyroid tests are still low, showing that she still has too much thyroid hormone her system. Let's decrease Lt4 to 88 mcg daily and I will check her TFTs when she comes back for a visit in July.

## 2016-12-24 NOTE — Telephone Encounter (Signed)
Patient sons called, gave him lab results. And advised of new dose. He understood. Had no questions.

## 2016-12-31 ENCOUNTER — Other Ambulatory Visit: Payer: Self-pay

## 2016-12-31 NOTE — Patient Outreach (Signed)
[facility]) Care Management   12/31/2016  [name] [name] 04-15-1934 [id number]  [name] [name] is an 83 y.o. female  Subjective: Home visit completed with patient and her son who lives with her, [name]. Patient has no complaints, stating she has been doing good. She stated "my blood sugars have been good for the past 2 days." Objective:   ROS  Physical Exam  Constitutional: She is oriented to person, place, and time. She appears well-developed and well-nourished. She is cooperative.  Respiratory: Effort normal.  GI: Soft. Normal appearance.  Neurological: She is alert and oriented to person, place, and time.  Skin: Skin is warm, dry and intact.  Psychiatric: She has a normal mood and affect. Her speech is normal and behavior is normal. Judgment and thought content normal. Cognition and memory are normal.    Encounter Medications:   Outpatient Encounter Prescriptions as of 12/31/2016  Medication Sig  . aspirin EC 325 MG tablet Take 325 mg by mouth daily.  . carvedilol (COREG) 12.5 MG tablet TAKE 1 TABLET TWICE A DAY WITH MEALS (Patient taking differently: Take 6.25 mgs twice daily with meals)  . clopidogrel (PLAVIX) 75 MG tablet Take 1 tablet (75 mg total) by mouth daily.  Marland Kitchen co-enzyme Q-10 30 MG capsule Take 30 mg by mouth daily.  Marland Kitchen FREESTYLE LITE test strip USE ONE STRIP TO CHECK GLUCOSE TWICE DAILY  . furosemide (LASIX) 20 MG tablet Take 20 mg by mouth daily as needed for fluid.   Marland Kitchen glipiZIDE (GLUCOTROL) 10 MG tablet TAKE 1 TABLET TWICE A DAY BEFORE MEALS (Patient taking differently: Take 5 mg by mouth 2 (two) times daily before a meal. )  . hydrOXYzine (ATARAX/VISTARIL) 25 MG tablet Take 1 tablet (25 mg total) by mouth 3 (three) times daily as needed. (Patient taking differently: Take 25 mg by mouth 3 (three) times daily as needed for itching. )  . ibuprofen (ADVIL,MOTRIN) 200 MG tablet Take 400 mg by mouth daily as needed for headache or moderate pain.  Marland Kitchen insulin  aspart protamine - aspart (NOVOLOG MIX 70/30 FLEXPEN) (70-30) 100 UNIT/ML FlexPen Inject 0.12-0.14 mLs (12-14 Units total) into the skin 2 (two) times daily before a meal.  . levothyroxine (SYNTHROID, LEVOTHROID) 88 MCG tablet Take 1 tablet (88 mcg total) by mouth daily.  Marland Kitchen lisinopril (PRINIVIL,ZESTRIL) 5 MG tablet Take 1 tablet (5 mg total) by mouth 2 (two) times daily.  . Omega-3 Fatty Acids (EQL OMEGA 3 FISH OIL) 1200 MG CAPS Take 1,200 mg by mouth daily.  . pantoprazole (PROTONIX) 40 MG tablet Take 1 tablet (40 mg total) by mouth daily.  . rosuvastatin (CRESTOR) 20 MG tablet Take 1 tablet (20 mg total) by mouth daily.  . Vitamin D, Ergocalciferol, (DRISDOL) 50000 units CAPS capsule TAKE 1 CAPSULE EVERY 7 DAYS ON MONDAYS   No facility-administered encounter medications on file as of 12/31/2016.     Functional Status:   In your present state of health, do you have any difficulty performing the following activities: 11/06/2016 11/01/2016  Hearing? Tempie Donning  Vision? Y Y  Difficulty concentrating or making decisions? Tempie Donning  Walking or climbing stairs? Y Y  Dressing or bathing? Y Y  Doing errands, shopping? Tempie Donning  Preparing Food and eating ? Y Y  Using the Toilet? N N  In the past six months, have you accidently leaked urine? Y Y  Do you have problems with loss of bowel control? N N  Managing your Medications? Tempie Donning  Managing  your Finances? Tempie Donning  Housekeeping or managing your Housekeeping? Y Y  Some recent data might be hidden    Fall/Depression Screening:    Fall Risk  11/06/2016 11/01/2016 08/27/2016  Falls in the past year? Yes Yes Yes  Number falls in past yr: 2 or more 2 or more 2 or more  Injury with Fall? Yes Yes No  Risk Factor Category  High Fall Risk High Fall Risk -  Risk for fall due to : Impaired mobility;Impaired balance/gait;History of fall(s) Impaired mobility;Impaired vision;Impaired balance/gait -  Follow up Falls evaluation completed;Education provided;Falls prevention discussed  Falls prevention discussed;Education provided -   PHQ 2/9 Scores 11/06/2016 11/01/2016  PHQ - 2 Score 0 2  PHQ- 9 Score - 4    Assessment:   Patient reported that she recently went for labwork for Dr. Cruzita Lederer and they had to lower her dose of her levothyroxine to 88 mcg. Her son confirmed that they have picked up the new prescription and that she is now taking this dose.  RNCM reviewed patient blood sugars. Patient's glucometer has incorrect date/time and RNCM was able to correct this and set it for patient. Patient has been checking her blood sugars twice daily. She checks it in the mornings, although they are not always fasting and she also checks it at various times in the afternoons. RNCM advised that with correct date/time on glucometer, it will be easier to track when she is checking them. Her son Woodfin Ganja stated that they have not been writing them down, but that he and his brother Maudie Mercury text them back and forth so that they have a record on their cell phones of her blood sugars. These are the following blood sugar readings (unable to determine which ones are fasting and non-fasting, also unable to determine times sugars were checked):  5/8 142 am  5/15 203 am  5/8 299 pm  5/15 no second check  5/9 333 am  5/16 216 am  5/9 331 pm  5/16 220 pm  5/10 287 am  5/17 95 am  5/10 283 pm  5/17 376 pm  5/11 186 am  5/18 205 am  5/11 402 pm  5/18 307 pm  5/12 84 am  5/19 224 am  5/12 352 pm  5/19 307 pm  5/13 173 am  5/20 301 am  5/13 336 pm  5/20 328 pm  5/13 362 pm  5/21 72 am  5/14 242 am  5/21 283 pm  5/14 363 pm  5/22 128 pm   Morning blood sugars have ranged from 72 to 333 (some are fasting and some are non-fasting, but son unable to tell which ones are fasting)  Afternoon/Evening blood sugars have ranged from 128 to 402.  Patient and son reported the following diet:  Breakfast: cold cereal of Raison Bran, occasionally a glass of juice (pineapple, orange)  Lunch: Ham and egg  sandwich (typically eats 1/2 sandwich for lunch, then snacks on the rest throughout the afternoon. Her son reported sometimes it takes up to 3 hours for her to finish eating the whole sandwich)  Dinner: Meat, vegetables and bread (last night had pork and cabbage; likes to eat broccoli)  Snacks: Per her son, she has been snacking on the rest of her lunch. She was previously eating grapes, oranges and bananas.   Patient has not been drinking her Glucerna shakes as she ran out and needs to get more.   Patient's son reported that he and his brother  removed all cakes, cookies, oranges and grapes from the home in the last week as patient has noticed that these things increase her blood sugar the most. RNCM educated patient on importance of limiting her juice as it is high in carbohydrates and can significantly increase her blood sugars. Patient stated that she drinks water only most of the time. RNCM encouraged patient to include a protein with her breakfast such as a boiled egg. Patient's son stated that she does occasionally eat a scrambled egg.   Patient typically eats small meals throughout the day. She likes fruit, but has been limited due to her favorites increasing her blood sugar. RNCM encouraged patient to eat snacks that incorporate a complex carbohydrate and a protein. Recommended a small apple and peanut butter or cheese and whole wheat crackers.   Patient's son are ensuring that she is taking her insulin as ordered and are giving the novolog to her 10-15 minutes prior to her meals. [name] verbalized that they give her 12 units for meals without dessert and 14 units for meals when she eats a dessert. He also confirmed that she is taking her glipizide twice a day 10-15 minutes before meals.   Patient has recently made some changes to her diet such as eliminating some of the fruits and snacks that have she has observed as increasing her blood sugars. Will continue to check her sugars.  Plan:  RNCM to do a follow up home visit within the next 2-3 weeks.  [facility] CM Care Plan Problem One     Most Recent Value  Care Plan Problem One  Uncontrolled diabetes as evidenced by A1C of 12 on 10/03/2016  Role Documenting the Problem One  Care Management [place] for Problem One  Active  [facility] Long Term Goal (31-90 days)  Patient will reduce A1C by at least 2 points within the next 90 days  [facility] Long Term Goal Start Date  11/06/16  Interventions for Problem One Long Term Goal  RNCM provided education about the importance of monitoring blood glucose, taking medications as prescribed and watching how many carbohydrates she eats in a day  [facility] CM Short Term Goal #1 (0-30 days)  Patient will take all medications as prescribed for the next 30 days  [facility] CM Short Term Goal #1 Start Date  11/06/16  [facility] CM Short Term Goal #1 Met Date  12/17/16  Interventions for Short Term Goal #1  RNCM educated on importance of taking all medications as prescribed to help control diabetes  [facility] CM Short Term Goal #2 (0-30 days)  Patient will check her blood sugar once a day for the next 30 days  [facility] CM Short Term Goal #2 Start Date  11/06/16  [facility] CM Short Term Goal #2 Met Date  12/17/16  Interventions for Short Term Goal #2  RNCM provided education on importance of checking her blood sugar daily and writing it down to look for trends. Provided [facility] calendar and encouraged to use diabetes tab to document blood sugars, Encouraged to check sugars at different times to see how certain foods are affecting her sugars, as well as some fasting in the mornings.  [facility] CM Short Term Goal #3 (0-30 days)  Patient will check her blood sugar twice a day for the next 2 weeks  [facility] CM Short Term Goal #3 Start Date  12/31/16  Interventions for Short Tern Goal #3  RNCM educated patient and family regarding importance of checking her blood sugars more than  once a day in order to look at patterns and things that may be affecting her blood  sugar. Reminded of [name] request to check twice a day, once in the morning and the second time at various times. RNCM set correct date and time on glucometer for easier/more accurate review.     [place] R. Sherita Decoste, RN, BSN, [place] Management Coordinator [phone]

## 2017-01-08 ENCOUNTER — Other Ambulatory Visit: Payer: Self-pay | Admitting: Internal Medicine

## 2017-01-17 ENCOUNTER — Encounter: Payer: Self-pay | Admitting: Physician Assistant

## 2017-01-17 ENCOUNTER — Ambulatory Visit (INDEPENDENT_AMBULATORY_CARE_PROVIDER_SITE_OTHER): Payer: Medicare Other | Admitting: Physician Assistant

## 2017-01-17 VITALS — BP 164/72 | HR 64 | Ht 65.0 in | Wt 136.2 lb

## 2017-01-17 DIAGNOSIS — I5042 Chronic combined systolic (congestive) and diastolic (congestive) heart failure: Secondary | ICD-10-CM

## 2017-01-17 DIAGNOSIS — I6529 Occlusion and stenosis of unspecified carotid artery: Secondary | ICD-10-CM

## 2017-01-17 DIAGNOSIS — Z95810 Presence of automatic (implantable) cardiac defibrillator: Secondary | ICD-10-CM | POA: Diagnosis not present

## 2017-01-17 DIAGNOSIS — I2510 Atherosclerotic heart disease of native coronary artery without angina pectoris: Secondary | ICD-10-CM | POA: Diagnosis not present

## 2017-01-17 DIAGNOSIS — I447 Left bundle-branch block, unspecified: Secondary | ICD-10-CM

## 2017-01-17 NOTE — Progress Notes (Addendum)
Cardiology Office Note Date:  01/17/2017  Patient ID:  [name] [name], DOB 11-14-1933, MRN [id number] PCP:  [name], MD  Cardiologist:  Dr. Allyson Sabal (PVD) CHF: Dr. Barth Kirks Electrophysiologist: Dr. Graciela Husbands   Chief Complaint: annual EP visit  History of Present Illness: [name] [name] is a 83 y.o. female with history of HTN, DM, Hypothyroidism, chronic CHF (mixed), LBBB, CAD with R/L cath in 2014 significant OM 2,3 lesions treated medically, EF Fwas 25-30%, w/CRT-D, and PVD s/p stent/angiplasty to left popliteal artery 06/17/16.  She comes in today to be seen for Dr. Graciela Husbands, last seen by him May 2017.  At that visit she c/o post prandial dizziness though only w/supper, noting she was taking her evening meds in the afternoon, these were moved to actual evening.  She comes today accompanied by one her sons.  She reports feeling well, comes today in a wheelchair, though ambulates at home does light housework without some mild DOE of late, no symptoms of PND or orthopnea.  No CP, palpitations, no dizziness, near syncope or syncope.  She/they report her toe back to "normal", no LE pain with ambulation, no coldness, numbness, she sees Dr. Allyson Sabal soon in f/u.  She has not perceived any swelling or water retention.  Device information: SJM CRT-D implanted 10/01/13, Dr. Graciela Husbands   Past Medical History:  Diagnosis Date  . Chronic combined systolic and diastolic CHF, NYHA class 3 (HCC)    a. 03/2013 Echo: EF 25-30%, diff HK, sev antsept HK, mild MR.  . Congestive dilated cardiomyopathy (HCC)    a. 03/2013 Echo: EF 25-30%;  b. 09/2013 s/p SJM 3242 CRT-P.  Marland Kitchen Coronary artery disease    a. 08/2012 Cath: LM nl, LAD 28m, LCX nondom, mild-mod nonobs dzs mid, OM1 ok, OM2 90/90p, RCA 40, EF 25-30%-->Med Rx.  . GERD (gastroesophageal reflux disease)   . High cholesterol   . Hypertension   . Hypothyroidism   . Left bundle branch block   . Osteoarthritis   . Stroke (HCC)   . Type II diabetes mellitus (HCC)      Past Surgical History:  Procedure Laterality Date  . ABDOMINAL HYSTERECTOMY  1980  . BI-VENTRICULAR PACEMAKER INSERTION N/A 10/01/2013   Procedure: BI-VENTRICULAR PACEMAKER INSERTION (CRT-P);  Surgeon: Duke Salvia, MD;  Location: [facility] CATH LAB;  Service: Cardiovascular;  Laterality: N/A;  . BI-VENTRICULAR PACEMAKER INSERTION (CRT-P)     a. 09/2013 s/p SJM 3242 CRT-P.  . Bilateral cateract surgery    . BMD  2006  . IR GENERIC HISTORICAL  10/18/2016   IR ANGIO INTRA EXTRACRAN SEL COM CAROTID INNOMINATE BILAT MOD SED 10/18/2016 Julieanne Cotton, MD [facility]-INTERV RAD  . IR GENERIC HISTORICAL  10/18/2016   IR ANGIO VERTEBRAL SEL SUBCLAVIAN INNOMINATE BILAT MOD SED 10/18/2016 Julieanne Cotton, MD [facility]  . ORIF TIBIA PLATEAU Right 12/04/2013   Procedure: OPEN REDUCTION INTERNAL FIXATION (ORIF) TIBIAL PLATEAU;  Surgeon: Verlee Rossetti, MD;  Location: [facility] ORS;  Service: Orthopedics;  Laterality: Right;  . PERIPHERAL VASCULAR CATHETERIZATION N/A 06/17/2016   Procedure: Lower Extremity Angiography;  Surgeon: Runell Gess, MD;  Location: [facility] INVASIVE CV LAB;  Service: Cardiovascular;  Laterality: N/A;  . PERIPHERAL VASCULAR CATHETERIZATION Left 06/17/2016   Procedure: Peripheral Vascular Atherectomy;  Surgeon: Runell Gess, MD;  Location: [facility] INVASIVE CV LAB;  Service: Cardiovascular;  Laterality: Left;  Popliteal   . RIGHT HEART CATHETERIZATION N/A 09/08/2012   Procedure: RIGHT HEART CATH;  Surgeon: Dolores Patty, MD;  Location: [facility]  CATH LAB;  Service: Cardiovascular;  Laterality: N/A;    Current Outpatient Prescriptions  Medication Sig Dispense Refill  . aspirin EC 325 MG tablet Take 325 mg by mouth daily.    . carvedilol (COREG) 12.5 MG tablet Take 6.25 mg by mouth 2 (two) times daily.    . clopidogrel (PLAVIX) 75 MG tablet TAKE ONE TABLET BY MOUTH ONCE DAILY 90 tablet 2  . co-enzyme Q-10 30 MG capsule Take 30 mg by mouth daily.    Marland Kitchen FREESTYLE LITE test strip USE ONE STRIP TO CHECK  GLUCOSE TWICE DAILY 200 each 3  . furosemide (LASIX) 20 MG tablet Take 20 mg by mouth daily as needed for fluid.     Marland Kitchen glipiZIDE (GLUCOTROL) 10 MG tablet Take 5 mg by mouth 2 (two) times daily before a meal.    . hydrOXYzine (ATARAX/VISTARIL) 25 MG tablet Take 25 mg by mouth 3 (three) times daily as needed for itching.    Marland Kitchen ibuprofen (ADVIL,MOTRIN) 200 MG tablet Take 400 mg by mouth daily as needed for headache or moderate pain.    Marland Kitchen insulin aspart protamine - aspart (NOVOLOG MIX 70/30 FLEXPEN) (70-30) 100 UNIT/ML FlexPen Inject 0.12-0.14 mLs (12-14 Units total) into the skin 2 (two) times daily before a meal. 15 mL 5  . levothyroxine (SYNTHROID, LEVOTHROID) 88 MCG tablet Take 1 tablet (88 mcg total) by mouth daily. 90 tablet 0  . lisinopril (PRINIVIL,ZESTRIL) 5 MG tablet Take 1 tablet (5 mg total) by mouth 2 (two) times daily. 180 tablet 3  . Omega-3 Fatty Acids (EQL OMEGA 3 FISH OIL) 1200 MG CAPS Take 1,200 mg by mouth daily.    . pantoprazole (PROTONIX) 40 MG tablet Take 1 tablet (40 mg total) by mouth daily. 30 tablet 6  . rosuvastatin (CRESTOR) 20 MG tablet Take 1 tablet (20 mg total) by mouth daily. 90 tablet 2  . Vitamin D, Ergocalciferol, (DRISDOL) 50000 units CAPS capsule Take 1 capsule by mouth every 7 (seven) days. (MONDAYS)     No current facility-administered medications for this visit.     Allergies:   Statins; Metformin and related; and Vytorin [ezetimibe-simvastatin]   Social History:  The patient  reports that she has quit smoking. She has quit using smokeless tobacco. She reports that she does not drink alcohol or use drugs.   Family History:  The patient's family history includes Diabetes in her mother and other; Heart attack in her brother; Hypertension in her other; Uterine cancer in her other.  ROS:  Please see the history of present illness.    All other systems are reviewed and otherwise negative.   PHYSICAL EXAM:  VS:  BP (!) 164/72 (BP Location: Right Arm)   Pulse  64   Ht 5\' 5"  (1.651 m)   Wt 136 lb 3.2 oz (61.8 kg)   BMI 22.66 kg/m  BMI: Body mass index is 22.66 kg/m. Well nourished, well developed, in no acute distress  HEENT: normocephalic, atraumatic  Neck: no JVD, carotid bruits or masses Cardiac:  RRR; 1/6 SM, no rubs, or gallops Lungs: CTA b/l, no wheezing, rhonchi or rales  Abd: soft, nontender MS: no deformity or atrophy Ext: no edema, warm b/l Skin: warm and dry, no rash Neuro:  No gross deficits appreciated Psych: euthymic mood, full affect  PPM site is stable, no tethering or discomfort   EKG:  07/16/16 was SR V paced PPM interrogation done today and reviewed by myself: battery and lead measurements are good, AMS episodes are  farfield and her PVARP was extended to , >99% BiVe paced, no V arrhythmias  01/12/16: TTE Study Conclusions - Left ventricle: The cavity size was normal. There was mild   concentric hypertrophy. Systolic function was normal. The   estimated ejection fraction was in the range of 60% to 65%. Wall   motion was normal; there were no regional wall motion   abnormalities. There was an increased relative contribution of   atrial contraction to ventricular filling. Doppler parameters are   consistent with abnormal left ventricular relaxation (grade 1   diastolic dysfunction). Doppler parameters are consistent with   high ventricular filling pressure. - Aortic valve: Moderate focal calcification, consistent with   sclerosis. - Mitral valve: Minimal focal calcification of the anterior leaflet   (medial segment(s)).  05/09/14: TTE EF 25-30%  Recent Labs: 10/03/2016: ALT 26 10/18/2016: BUN 30; Creatinine, Ser 1.40; Hemoglobin 13.6; Platelets 224; Potassium 3.9; Sodium 141 12/23/2016: TSH <0.01  10/03/2016: Cholesterol 135; Direct LDL 49.0; HDL 23.80; Total CHOL/HDL Ratio 6; Triglycerides 326.0; VLDL 65.2   CrCl cannot be calculated (Patient's most recent lab result is older than the maximum 21 days allowed.).    Wt Readings from Last 3 Encounters:  01/17/17 136 lb 3.2 oz (61.8 kg)  12/16/16 133 lb 4 oz (60.4 kg)  11/22/16 130 lb (59 kg)     Other studies reviewed: Additional studies/records reviewed today include: summarized above  ASSESSMENT AND PLAN:  1. Chronic CHF, (systolic/diastolic)     Much improved EF by last echo     On BB/ACE     She mentions perhaps feeling a little SOB with exertion     CorVue suggests fluid OL, her weight is up, with some minimal DOE, no overt exam findings of fluid OL are appreciated     They are instructed to take her lasix BID today and tomorrow, resume daily after that     Instructed on daily weights and minimizing sodium intake  2. ICD     Intact function, reprogrammed as noted  3. CAD     No anginal symptoms     On ASA, BB, statin  She is high dose ASA, in discussion with the patient and son, unclear who/why, he mentions her neurologist may have.  I asked them to ask Dr. Allyson Sabal if from a PVD intervention stando=point his recommendations as well as acll her neurologist.  From a cardiac standpoint I do not see an indication for the high dose and 81mg  strength would be OK from our standpoint.   Disposition: F/u with q 3 month remote device checks, Dr. Graciela Husbands in 1 year, sooner if needed.  I neglected to ask the extra lasix doses for 2 days be given as instructions on the AVS, though discussed with the patient and her son with her, I called and spoke to the patient's son [name] [name] who manages her medicines the instructions.  He reported understanding and repeated the instructions back to me.  Current medicines are reviewed at length with the patient today.  The patient did not have any concerns regarding medicines.  Judith Blonder, PA-C 01/17/2017 6:13 PM     [facility] [address] [place] [address] [phone] (office)  [phone] (fax)

## 2017-01-17 NOTE — Patient Instructions (Signed)
Medication Instructions:  Your physician recommends that you continue on your current medications as directed. Please refer to the Current Medication list given to you today.    Labwork: None Ordered   Testing/Procedures: Remote monitoring is used to monitor your Pacemaker of ICD from home. This monitoring reduces the number of office visits required to check your device to one time per year. It allows [place] to keep an eye on the functioning of your device to ensure it is working properly. You are scheduled for a device check from home on 04/21/17. You may send your transmission at any time that day. If you have a wireless device, the transmission will be sent automatically. After your physician reviews your transmission, you will receive a postcard with your next transmission date.    Follow-Up: Your physician recommends that you schedule a follow-up appointment in: 1 year with Dr. Graciela Husbands   Any Other Special Instructions Will Be Listed Below (If Applicable).  Please do a test transmission today of the device.    If you need a refill on your cardiac medications before your next appointment, please call your pharmacy.  Thank you for choosing [facility]  Corrine CMA AAMA

## 2017-01-21 ENCOUNTER — Other Ambulatory Visit: Payer: Self-pay | Admitting: Internal Medicine

## 2017-01-22 ENCOUNTER — Other Ambulatory Visit: Payer: Self-pay | Admitting: Internal Medicine

## 2017-01-22 DIAGNOSIS — I5022 Chronic systolic (congestive) heart failure: Secondary | ICD-10-CM

## 2017-01-28 ENCOUNTER — Ambulatory Visit (INDEPENDENT_AMBULATORY_CARE_PROVIDER_SITE_OTHER): Payer: Medicare Other | Admitting: Cardiovascular Disease

## 2017-01-28 ENCOUNTER — Encounter: Payer: Self-pay | Admitting: Cardiovascular Disease

## 2017-01-28 VITALS — BP 136/68 | HR 62 | Wt 133.0 lb

## 2017-01-28 DIAGNOSIS — I998 Other disorder of circulatory system: Secondary | ICD-10-CM | POA: Diagnosis not present

## 2017-01-28 DIAGNOSIS — I70229 Atherosclerosis of native arteries of extremities with rest pain, unspecified extremity: Secondary | ICD-10-CM

## 2017-01-28 DIAGNOSIS — I10 Essential (primary) hypertension: Secondary | ICD-10-CM | POA: Diagnosis not present

## 2017-01-28 DIAGNOSIS — I6529 Occlusion and stenosis of unspecified carotid artery: Secondary | ICD-10-CM

## 2017-01-28 NOTE — Assessment & Plan Note (Signed)
[name] [name] returns today for follow-up of critical limb ischemia. She was referred to me by Dr. Charlsie Merles for a gangrenous appearing left second toe with diminished ABIs. Angiograms her 06/17/16 revealing an occluded left popliteal artery with 1 vessel runoff via peroneal artery. I intervened using diamondback orbital rotation left colectomy and drug-eluting balloon angioplasty. Follow-up ABI was normal and her left second toe did heal. In the office her toe is completely healed and is only mildly discolored.

## 2017-01-28 NOTE — Patient Instructions (Signed)
Medication Instructions: Your physician recommends that you continue on your current medications as directed. Please refer to the Current Medication list given to you today.   Testing/Procedures: Your physician has requested that you have a lower extremity arterial duplex. During this test, ultrasound is used to evaluate arterial blood flow in the legs. Allow one hour for this exam. There are no restrictions or special instructions.  Your physician has requested that you have an ankle brachial index (ABI). During this test an ultrasound and blood pressure cuff are used to evaluate the arteries that supply the arms and legs with blood. Allow thirty minutes for this exam. There are no restrictions or special instructions.  Follow-Up: Your physician recommends that you schedule a follow-up appointment as needed with Dr. Berry.     

## 2017-01-28 NOTE — Progress Notes (Signed)
01/28/2017 [name]   15-May-1934  [id number]  Primary Physician [name], MD Primary Cardiologist: Runell Gess MD Roseanne Reno  HPI:  Mrs. Kephart is a delightful 83 year old thin and fit frail-appearing. African-American female mother of 4, grandmother 7 grandchildren accompanied by her son came today. She was referred by her podiatrist, Dr. Charlsie Merles for peripheral vascular evaluation because of critical limb ischemia at a gangrenous left second toe. I last saw her in the office 07/16/16. She sees Dr. Dian Situ for diastolic heart failure and noncritical CAD. She does have a history of diabetes, hyperlipidemia, diastolic heart failure and noncritical CAD by cath January 2014. She's had a blackened left second toe for last 2 months that has become progressively gangrenous. Lower extremity arterial Doppler studies performed 06/05/16 revealed a right ABI 0.62 and a left of 0.46. She does have a high-frequency signal in her left popliteal artery suggesting high-grade disease as well as 1 vessel runoff via peroneal. She underwent peripheral angiography 06/17/16 revealing an occluded left popliteal artery with 1 vessel runoff via peroneal. I performed a diamondback orbital atherectomy followed by drug-eluting balloon angioplasty with excellent angiographic and clinical result. ABI improved from 0.46 up to 1.0. Her ischemic pain resolved and her left second toe has completely healed and is only mildly discolored.   Current Outpatient Prescriptions  Medication Sig Dispense Refill  . aspirin EC 325 MG tablet Take 325 mg by mouth daily.    . carvedilol (COREG) 12.5 MG tablet Take 6.25 mg by mouth 2 (two) times daily.    . clopidogrel (PLAVIX) 75 MG tablet TAKE ONE TABLET BY MOUTH ONCE DAILY 90 tablet 2  . co-enzyme Q-10 30 MG capsule Take 30 mg by mouth daily.    Marland Kitchen FREESTYLE LITE test strip USE ONE STRIP TO CHECK GLUCOSE TWICE DAILY 200 each 3  . furosemide (LASIX) 20 MG tablet  Take 20 mg by mouth daily as needed for fluid.     Marland Kitchen glipiZIDE (GLUCOTROL) 10 MG tablet Take 5 mg by mouth 2 (two) times daily before a meal.    . hydrOXYzine (ATARAX/VISTARIL) 25 MG tablet Take 25 mg by mouth 3 (three) times daily as needed for itching.    Marland Kitchen ibuprofen (ADVIL,MOTRIN) 200 MG tablet Take 400 mg by mouth daily as needed for headache or moderate pain.    Marland Kitchen insulin aspart protamine - aspart (NOVOLOG MIX 70/30 FLEXPEN) (70-30) 100 UNIT/ML FlexPen Inject 0.12-0.14 mLs (12-14 Units total) into the skin 2 (two) times daily before a meal. 15 mL 5  . levothyroxine (SYNTHROID, LEVOTHROID) 88 MCG tablet Take 1 tablet (88 mcg total) by mouth daily. 90 tablet 0  . lisinopril (PRINIVIL,ZESTRIL) 5 MG tablet Take 1 tablet (5 mg total) by mouth 2 (two) times daily. 180 tablet 3  . lisinopril (PRINIVIL,ZESTRIL) 5 MG tablet TAKE ONE TABLET BY MOUTH TWICE DAILY 180 tablet 3  . Omega-3 Fatty Acids (EQL OMEGA 3 FISH OIL) 1200 MG CAPS Take 1,200 mg by mouth daily.    . pantoprazole (PROTONIX) 40 MG tablet Take 1 tablet (40 mg total) by mouth daily. 30 tablet 6  . rosuvastatin (CRESTOR) 20 MG tablet TAKE ONE TABLET BY MOUTH ONCE DAILY 90 tablet 2  . Vitamin D, Ergocalciferol, (DRISDOL) 50000 units CAPS capsule Take 1 capsule by mouth every 7 (seven) days. (MONDAYS)     No current facility-administered medications for this visit.     Allergies  Allergen Reactions  . Statins Other (See  Comments)    Leg cramping  . Metformin And Related     Pt stopped it due to diarrhea  . Vytorin [Ezetimibe-Simvastatin] Other (See Comments)    Leg cramps    Social History   Social History  . Marital status: Divorced    Spouse name: N/A  . Number of children: 4  . Years of education: N/A   Occupational History  . Retired Engineer, civil (consulting)    Social History Main Topics  . Smoking status: Former Games developer  . Smokeless tobacco: Former Neurosurgeon  . Alcohol use No  . Drug use: No  . Sexual activity: Not Currently   Other  Topics Concern  . Not on file   Social History Narrative   Widowed.  Lives with her son.  Normally able to ambulate without assistance.  Quit smoking as a teenager.                Review of Systems: General: negative for chills, fever, night sweats or weight changes.  Cardiovascular: negative for chest pain, dyspnea on exertion, edema, orthopnea, palpitations, paroxysmal nocturnal dyspnea or shortness of breath Dermatological: negative for rash Respiratory: negative for cough or wheezing Urologic: negative for hematuria Abdominal: negative for nausea, vomiting, diarrhea, bright red blood per rectum, melena, or hematemesis Neurologic: negative for visual changes, syncope, or dizziness All other systems reviewed and are otherwise negative except as noted above.    Blood pressure 136/68, pulse 62, weight 133 lb (60.3 kg).  General appearance: alert and no distress Neck: no adenopathy, no carotid bruit, no JVD, supple, symmetrical, trachea midline and thyroid not enlarged, symmetric, no tenderness/mass/nodules Lungs: clear to auscultation bilaterally Heart: regular rate and rhythm, S1, S2 normal, no murmur, click, rub or gallop Extremities: extremities normal, atraumatic, no cyanosis or edema  EKG atrial sensed ventricular paced rhythm at 62. Personally reviewed this EKG.  ASSESSMENT AND PLAN:   Critical lower limb ischemia Ms. Bolinger returns today for follow-up of critical limb ischemia. She was referred to me by Dr. Charlsie Merles for a gangrenous appearing left second toe with diminished ABIs. Angiograms her 06/17/16 revealing an occluded left popliteal artery with 1 vessel runoff via peroneal artery. I intervened using diamondback orbital rotation left colectomy and drug-eluting balloon angioplasty. Follow-up ABI was normal and her left second toe did heal. In the office her toe is completely healed and is only mildly discolored.      Runell Gess MD FACP,FACC,FAHA,  [facility] 01/28/2017 4:29 PM

## 2017-01-29 ENCOUNTER — Encounter (HOSPITAL_COMMUNITY): Payer: Medicare Other

## 2017-01-31 ENCOUNTER — Other Ambulatory Visit: Payer: Self-pay

## 2017-01-31 NOTE — Patient Outreach (Signed)
[facility]) Care Management  01/31/17  [name] [name] August 05, 1934 [id number]  Attempted to reach patient and her son, [name] [name] without success. Left HIPAA compliant voicemail with RNCM contact information and requested callback to schedule visit for next week.  Plan: Will attempt to reach son again next week to schedule follow up home visit.  Turkey R. Aquila Menzie, RN, BSN, CCM [facility] Care Management Coordinator [phone]

## 2017-02-05 ENCOUNTER — Ambulatory Visit: Payer: Self-pay

## 2017-02-07 ENCOUNTER — Ambulatory Visit: Payer: Self-pay

## 2017-02-14 ENCOUNTER — Other Ambulatory Visit: Payer: Self-pay

## 2017-02-14 NOTE — Patient Outreach (Signed)
[facility]) Care Management  02/14/17  [name] [name] 05-07-1934 [id number]  Second attempt to reach patient's son, Lucillie Punter to schedule home visit without success. Left HIPAA compliant voicemail with RNCM contact information and invited callback.  Turkey R. Josephyne Tarter, RN, BSN, CCM [facility] Care Management Coordinator [phone]

## 2017-02-17 ENCOUNTER — Ambulatory Visit (INDEPENDENT_AMBULATORY_CARE_PROVIDER_SITE_OTHER): Payer: Medicare Other | Admitting: Podiatry

## 2017-02-17 ENCOUNTER — Encounter: Payer: Self-pay | Admitting: Podiatry

## 2017-02-17 DIAGNOSIS — E1140 Type 2 diabetes mellitus with diabetic neuropathy, unspecified: Secondary | ICD-10-CM | POA: Diagnosis not present

## 2017-02-17 DIAGNOSIS — M79609 Pain in unspecified limb: Secondary | ICD-10-CM | POA: Diagnosis not present

## 2017-02-17 DIAGNOSIS — B351 Tinea unguium: Secondary | ICD-10-CM

## 2017-02-17 DIAGNOSIS — Q828 Other specified congenital malformations of skin: Secondary | ICD-10-CM | POA: Diagnosis not present

## 2017-02-18 NOTE — Progress Notes (Signed)
Subjective:    Patient ID: [name] [name], female   DOB: 83 y.o.   MRN: [id number]   HPI patient presents with severe nail disease 1-5 both feet and very painful keratotic lesion of the third digit left    ROS      Objective:  Physical Exam neurovascular status unchanged with diminishment of sharp Dole vibratory and DTR reflexes with long-term diabetes with keratotic lesion porokeratotic in nature third digit left and thickened yellow brittle nailbeds 1-5 both feet that are painful     Assessment:   Chronic porokeratotic type distal lesion third left and mycotic nail infection 1-5 both feet      Plan:   Debridement of painful nailbeds 1-5 both feet and debridement of lesion third digit left with no iatrogenic bleeding noted

## 2017-02-25 ENCOUNTER — Encounter: Payer: Self-pay | Admitting: Neurology

## 2017-02-25 ENCOUNTER — Ambulatory Visit (INDEPENDENT_AMBULATORY_CARE_PROVIDER_SITE_OTHER): Payer: Medicare Other | Admitting: Neurology

## 2017-02-25 VITALS — BP 99/52 | HR 62 | Wt 133.4 lb

## 2017-02-25 DIAGNOSIS — G3184 Mild cognitive impairment, so stated: Secondary | ICD-10-CM

## 2017-02-25 DIAGNOSIS — I6529 Occlusion and stenosis of unspecified carotid artery: Secondary | ICD-10-CM

## 2017-02-25 NOTE — Progress Notes (Signed)
[facility] [address] [place]. [place] [address] [phone]       OFFICE FOLLOW-UP NOTE  [name] [name] Date of Birth:  Apr 21, 1934 Medical Record Number:  [id number]   HPI: Initial visit 03/29/16 :Ms Stradley is a 39 year pleasant African-American lady is seen today for the first office follow-up visit following hospital admission for stroke in May 2017. She is accompanied by her son. 83 y.o. female with medical history significant of hypothyroidism, hyperlipidemia, osteoarthritis, type 2 diabetes, CAD, left bundle branch block, chronic combined systolic and diastolic CHF, GERD who came to the emergency department due to recurrent dizziness episodes and unsteady gait for almost 2 weeks. She denies numbness or weakness, slurred speech or language articulation/comprehension difficulty. She describes this sensation as feeling drunk.. She presented outside time window for thrombolysis. MRI scan of the brain showed right posterior cerebral artery infarct and CT angiogram showed abrupt occlusion of the right posterior cerebral artery. There was a string sign and critical stenosis of the proximal left ICA at the origin as well as moderate to severe stenosis of both cavernous carotid arteries. Transthoracic echo showed normal ejection fraction. Hemoglobin A1c was significantly elevated at 11.2 and LDL cholesterol at 152 mg percent. Patient was started on aspirin and Plavix for stroke prevention given large vessel intracranial stenosis as well as on medications cholesterol and diabetes. Vascular surgery was consulted for asymptomatic carotid stenosis but given multivessel) extra cranial stenosis deferred treatment options to neuroradiology. Patient continues to have dense left-sided vision loss and walks with a cane. She is living at home. She has finished home physical occupational therapy and plans to start outpatient therapy next week. She does occasionally bump into objects in its  walls and has to be careful. She is tolerating aspirin and Plavix without bleeding and bruising so far. She did have significant short-term memory and cognitive difficulties initially but these seem to be gradually improving as per son. Patient has not yet seen Dr. Corliss Skains for discussion of asymptomatic carotid revascularization. Update 08/27/16 : She returns for followup after last visit 5 months ago. She continues to have poor peripheral vision on the left. She lives with her son and has 24-hour supervision at home. Her short-term memory seems to be improving. She however has reduced attention and ability to multi task. She has not been driving. Her blood pressure is well control She is tolerating aspirin and Plavix with only minor bruising and no bleeeding. She states her sugars are under good control but cannot tell me when they were last checked. Update 02/25/17 : She returns for follow-up after last visit 6 months ago. She is accompanied by her son today. She continues to have peripheral vision loss on the left and often runs into doorways and objects. She in fact had a fall a few months ago and required several stitches on her head. She continues to have mild short-term memory difficulties but these appear to be unchanged. She does not regularly participate in cognitively challenging activities. She remains on aspirin and Plavix and has minor bruising but no major bleeding. Her blood pressure is well controlled and today it is 99/54. Her fasting sugars ranged consistently in the low 100-130 range. She remains on Crestor which is tolerating well. She is living at home with her older son. She does not drive. She has no new complaints ROS:   14 system review of systems is positive for   Memory loss, loss of vision,  , fall, injury,joint pain  and all other systems negative  PMH:  Past Medical History:  Diagnosis Date  . Chronic combined systolic and diastolic CHF, NYHA class 3 (HCC)    a. 03/2013 Echo:  EF 25-30%, diff HK, sev antsept HK, mild MR.  . Congestive dilated cardiomyopathy (HCC)    a. 03/2013 Echo: EF 25-30%;  b. 09/2013 s/p SJM 3242 CRT-P.  Marland Kitchen Coronary artery disease    a. 08/2012 Cath: LM nl, LAD 42m, LCX nondom, mild-mod nonobs dzs mid, OM1 ok, OM2 90/90p, RCA 40, EF 25-30%-->Med Rx.  . GERD (gastroesophageal reflux disease)   . High cholesterol   . Hypertension   . Hypothyroidism   . Left bundle branch block   . Osteoarthritis   . Stroke (HCC)   . Type II diabetes mellitus (HCC)     Social History:  Social History   Social History  . Marital status: Divorced    Spouse name: N/A  . Number of children: 4  . Years of education: N/A   Occupational History  . Retired Engineer, civil (consulting)    Social History Main Topics  . Smoking status: Former Games developer  . Smokeless tobacco: Former Neurosurgeon  . Alcohol use No  . Drug use: No  . Sexual activity: Not Currently   Other Topics Concern  . Not on file   Social History Narrative   Widowed.  Lives with her son.  Normally able to ambulate without assistance.  Quit smoking as a teenager.               Medications:   Current Outpatient Prescriptions on File Prior to Visit  Medication Sig Dispense Refill  . aspirin EC 325 MG tablet Take 325 mg by mouth daily.    . carvedilol (COREG) 12.5 MG tablet Take 6.25 mg by mouth 2 (two) times daily.    . clopidogrel (PLAVIX) 75 MG tablet TAKE ONE TABLET BY MOUTH ONCE DAILY 90 tablet 2  . co-enzyme Q-10 30 MG capsule Take 30 mg by mouth daily.    Marland Kitchen FREESTYLE LITE test strip USE ONE STRIP TO CHECK GLUCOSE TWICE DAILY 200 each 3  . furosemide (LASIX) 20 MG tablet Take 20 mg by mouth daily as needed for fluid.     Marland Kitchen glipiZIDE (GLUCOTROL) 10 MG tablet Take 5 mg by mouth 2 (two) times daily before a meal.    . hydrOXYzine (ATARAX/VISTARIL) 25 MG tablet Take 25 mg by mouth 3 (three) times daily as needed for itching.    Marland Kitchen ibuprofen (ADVIL,MOTRIN) 200 MG tablet Take 400 mg by mouth daily as needed for  headache or moderate pain.    Marland Kitchen insulin aspart protamine - aspart (NOVOLOG MIX 70/30 FLEXPEN) (70-30) 100 UNIT/ML FlexPen Inject 0.12-0.14 mLs (12-14 Units total) into the skin 2 (two) times daily before a meal. 15 mL 5  . levothyroxine (SYNTHROID, LEVOTHROID) 88 MCG tablet Take 1 tablet (88 mcg total) by mouth daily. 90 tablet 0  . lisinopril (PRINIVIL,ZESTRIL) 5 MG tablet Take 1 tablet (5 mg total) by mouth 2 (two) times daily. 180 tablet 3  . lisinopril (PRINIVIL,ZESTRIL) 5 MG tablet TAKE ONE TABLET BY MOUTH TWICE DAILY 180 tablet 3  . Omega-3 Fatty Acids (EQL OMEGA 3 FISH OIL) 1200 MG CAPS Take 1,200 mg by mouth daily.    . pantoprazole (PROTONIX) 40 MG tablet Take 1 tablet (40 mg total) by mouth daily. 30 tablet 6  . rosuvastatin (CRESTOR) 20 MG tablet TAKE ONE TABLET BY MOUTH ONCE DAILY 90 tablet 2  .  Vitamin D, Ergocalciferol, (DRISDOL) 50000 units CAPS capsule Take 1 capsule by mouth every 7 (seven) days. (MONDAYS)     No current facility-administered medications on file prior to visit.     Allergies:   Allergies  Allergen Reactions  . Statins Other (See Comments)    Leg cramping  . Metformin And Related     Pt stopped it due to diarrhea  . Vytorin [Ezetimibe-Simvastatin] Other (See Comments)    Leg cramps    Physical Exam General: Frail elderly African-American, seated, in no evident distress Head: head normocephalic and atraumatic.  Neck: supple with no carotid or supraclavicular bruits Cardiovascular: regular rate and rhythm, no murmurs Musculoskeletal: no deformity Skin:  no rash/petichiae Vascular:  Normal pulses all extremities Vitals:   02/25/17 1007  BP: (!) 99/52  Pulse: 62   Neurologic Exam Mental Status: Awake and fully alert. Oriented to place and time. Recent and remote memory intact. Attention span, concentration and fund of knowledge appropriate. Mood and affect appropriate. Her the diminished recall 1/3. Animal naming 6 only. Cranial Nerves: Fundoscopic  exam reveals sharp disc margins. Pupils equal, briskly reactive to light. Extraocular movements full without nystagmus. Visual fields show dense left homonymous hemianopsia l to confrontation. Hearing intact. Facial sensation intact. Face, tongue, palate moves normally and symmetrically.  Motor: Normal bulk and tone. Normal strength in all tested extremity muscles. Diminished fine finger movements on the left. Orbits right over left upper extremity. Sensory.: intact to touch ,pinprick .position and vibratory sensation.  Coordination: Rapid alternating movements normal in all extremities. Finger-to-nose and heel-to-shin performed accurately bilaterally. Gait and Station: Arises from chair without difficulty. Stance is normal. Gait demonstrates normal stride length but mild imbalance .  Reflexes: 1+ and symmetric. Toes downgoing.   NIHSS 3 Modified Rankin  3  ASSESSMENT: 46 year African-American lady with embolic right posterior cerebral artery infarct in May 2017 with asymptomatic severe left proximal carotid and bilateral cavernous carotid stenosis. Vascular risk factors of hypertension, hyperlipidemia, diabetes, congestive heart failure and large vessel cerebrovascular disease.Mild cognitive impairment which is stable.    PLAN: I had a long d/w patient and her son about hier remote t stroke,mild cognitive impairment, visual field defect, risk for recurrent stroke/TIAs, personally independently reviewed imaging studies and stroke evaluation results and answered questions.Continue aspirin and plavix for secondary stroke prevention and maintain strict control of hypertension with blood pressure goal below 130/90, diabetes with hemoglobin A1c goal below 6.5% and lipids with LDL cholesterol goal below 70 mg/dL. I also advised the patient to eat a healthy diet with plenty of whole grains, cereals, fruits and vegetables, exercise regularly and maintain ideal body weight. While walking and fall and safetyI  advised her fall and safety prevention precautions and also to participate in cognitively challenging activities  Followup in the future with my nurse practitioner in 6 months or call earlier if necessary Greater than 50%  time during this 25 minute visit boss spent on counseling and coordination of care about her stroke and answering questions.Followup in the future with my nurse practitioner in 6 months or call earlier if necessary Delia Heady, MD  [facility] [address] [place], [place] [address]  Phone [phone] Fax [phone] Note: This document was prepared with digital dictation and possible smart phrase technology. Any transcriptional errors that result from this process are unintentional

## 2017-02-25 NOTE — Patient Instructions (Signed)
I had a long d/w patient and her son about hier remote t stroke,mild cognitive impairment, visual field defect, risk for recurrent stroke/TIAs, personally independently reviewed imaging studies and stroke evaluation results and answered questions.Continue aspirin and plavix for secondary stroke prevention and maintain strict control of hypertension with blood pressure goal below 130/90, diabetes with hemoglobin A1c goal below 6.5% and lipids with LDL cholesterol goal below 70 mg/dL. I also advised the patient to eat a healthy diet with plenty of whole grains, cereals, fruits and vegetables, exercise regularly and maintain ideal body weight. While walking and fall and safetyI advised her fall and safety prevention precautions and also to participate in cognitively challenging activities  Followup in the future with my nurse practitioner in 6 months or call earlier if necessary Fall Prevention in the Home Falls can cause injuries and can affect people from all age groups. There are many simple things that you can do to make your home safe and to help prevent falls. What can I do on the outside of my home?  Regularly repair the edges of walkways and driveways and fix any cracks.  Remove high doorway thresholds.  Trim any shrubbery on the main path into your home.  Use bright outdoor lighting.  Clear walkways of debris and clutter, including tools and rocks.  Regularly check that handrails are securely fastened and in good repair. Both sides of any steps should have handrails.  Install guardrails along the edges of any raised decks or porches.  Have leaves, snow, and ice cleared regularly.  Use sand or salt on walkways during winter months.  In the garage, clean up any spills right away, including grease or oil spills. What can I do in the bathroom?  Use night lights.  Install grab bars by the toilet and in the tub and shower. Do not use towel bars as grab bars.  Use non-skid mats or decals  on the floor of the tub or shower.  If you need to sit down while you are in the shower, use a plastic, non-slip stool.  Keep the floor dry. Immediately clean up any water that spills on the floor.  Remove soap buildup in the tub or shower on a regular basis.  Attach bath mats securely with double-sided non-slip rug tape.  Remove throw rugs and other tripping hazards from the floor. What can I do in the bedroom?  Use night lights.  Make sure that a bedside light is easy to reach.  Do not use oversized bedding that drapes onto the floor.  Have a firm chair that has side arms to use for getting dressed.  Remove throw rugs and other tripping hazards from the floor. What can I do in the kitchen?  Clean up any spills right away.  Avoid walking on wet floors.  Place frequently used items in easy-to-reach places.  If you need to reach for something above you, use a sturdy step stool that has a grab bar.  Keep electrical cables out of the way.  Do not use floor polish or wax that makes floors slippery. If you have to use wax, make sure that it is non-skid floor wax.  Remove throw rugs and other tripping hazards from the floor. What can I do in the stairways?  Do not leave any items on the stairs.  Make sure that there are handrails on both sides of the stairs. Fix handrails that are broken or loose. Make sure that handrails are as long as the  stairways.  Check any carpeting to make sure that it is firmly attached to the stairs. Fix any carpet that is loose or worn.  Avoid having throw rugs at the top or bottom of stairways, or secure the rugs with carpet tape to prevent them from moving.  Make sure that you have a light switch at the top of the stairs and the bottom of the stairs. If you do not have them, have them installed. What are some other fall prevention tips?  Wear closed-toe shoes that fit well and support your feet. Wear shoes that have rubber soles or low  heels.  When you use a stepladder, make sure that it is completely opened and that the sides are firmly locked. Have someone hold the ladder while you are using it. Do not climb a closed stepladder.  Add color or contrast paint or tape to grab bars and handrails in your home. Place contrasting color strips on the first and last steps.  Use mobility aids as needed, such as canes, walkers, scooters, and crutches.  Turn on lights if it is dark. Replace any light bulbs that burn out.  Set up furniture so that there are clear paths. Keep the furniture in the same spot.  Fix any uneven floor surfaces.  Choose a carpet design that does not hide the edge of steps of a stairway.  Be aware of any and all pets.  Review your medicines with your healthcare provider. Some medicines can cause dizziness or changes in blood pressure, which increase your risk of falling. Talk with your health care provider about other ways that you can decrease your risk of falls. This may include working with a physical therapist or trainer to improve your strength, balance, and endurance. This information is not intended to replace advice given to you by your health care provider. Make sure you discuss any questions you have with your health care provider. Document Released: 07/19/2002 Document Revised: 12/26/2015 Document Reviewed: 09/02/2014 Elsevier Interactive Patient Education  2017 [employer].

## 2017-02-27 ENCOUNTER — Ambulatory Visit (HOSPITAL_COMMUNITY)
Admission: RE | Admit: 2017-02-27 | Discharge: 2017-02-27 | Disposition: A | Discharge status: Home / Self Care | Payer: Medicare Other | Source: Ambulatory Visit | Attending: Cardiology | Admitting: Cardiology

## 2017-02-27 ENCOUNTER — Ambulatory Visit (HOSPITAL_COMMUNITY): Payer: Medicare Other

## 2017-02-27 ENCOUNTER — Ambulatory Visit (HOSPITAL_COMMUNITY)
Admission: RE | Admit: 2017-02-27 | Discharge: 2017-02-27 | Disposition: A | Discharge status: Home / Self Care | Payer: Medicare Other | Source: Ambulatory Visit | Attending: Cardiovascular Disease | Admitting: Cardiovascular Disease

## 2017-02-27 DIAGNOSIS — I998 Other disorder of circulatory system: Secondary | ICD-10-CM

## 2017-02-27 DIAGNOSIS — E1151 Type 2 diabetes mellitus with diabetic peripheral angiopathy without gangrene: Secondary | ICD-10-CM | POA: Diagnosis not present

## 2017-02-27 DIAGNOSIS — Z9862 Peripheral vascular angioplasty status: Secondary | ICD-10-CM | POA: Insufficient documentation

## 2017-02-27 DIAGNOSIS — I10 Essential (primary) hypertension: Secondary | ICD-10-CM | POA: Diagnosis not present

## 2017-02-27 DIAGNOSIS — Z87891 Personal history of nicotine dependence: Secondary | ICD-10-CM | POA: Insufficient documentation

## 2017-02-27 DIAGNOSIS — I739 Peripheral vascular disease, unspecified: Secondary | ICD-10-CM | POA: Diagnosis not present

## 2017-02-27 DIAGNOSIS — I2510 Atherosclerotic heart disease of native coronary artery without angina pectoris: Secondary | ICD-10-CM | POA: Insufficient documentation

## 2017-02-27 DIAGNOSIS — E785 Hyperlipidemia, unspecified: Secondary | ICD-10-CM | POA: Diagnosis not present

## 2017-02-27 DIAGNOSIS — I70229 Atherosclerosis of native arteries of extremities with rest pain, unspecified extremity: Secondary | ICD-10-CM

## 2017-03-03 ENCOUNTER — Other Ambulatory Visit: Payer: Self-pay

## 2017-03-03 NOTE — Patient Outreach (Signed)
[facility]) Care Management  03/03/17  [name] [name] 1934-02-08 [id number]  RNCM received callback from patient's son. Home visit scheduled for later this week.  Turkey R. Natelie Ostrosky, RN, BSN, CCM [facility] Care Management Coordinator [phone]

## 2017-03-03 NOTE — Patient Outreach (Signed)
[facility]) Care Management  03/03/17  [name] [name] 29-Oct-1933 [id number]  Attempted to reach patient and her son Selena Batten at his cell number without success (His cell number is listed as her only contact number). Left HIPAA compliant voicemail with RNCM contact information requesting callback.   Will attempt to reach patient later this week to schedule a home visit.  [place] R. Babe Anthis, RN, BSN, CCM [facility] Care Management Coordinator [phone]

## 2017-03-04 ENCOUNTER — Encounter: Payer: Self-pay | Admitting: Internal Medicine

## 2017-03-04 ENCOUNTER — Ambulatory Visit (INDEPENDENT_AMBULATORY_CARE_PROVIDER_SITE_OTHER): Payer: Medicare Other | Admitting: Internal Medicine

## 2017-03-04 ENCOUNTER — Other Ambulatory Visit: Payer: Self-pay | Admitting: Cardiovascular Disease

## 2017-03-04 VITALS — BP 110/60 | HR 64 | Wt 132.0 lb

## 2017-03-04 DIAGNOSIS — E039 Hypothyroidism, unspecified: Secondary | ICD-10-CM | POA: Diagnosis not present

## 2017-03-04 DIAGNOSIS — Z794 Long term (current) use of insulin: Secondary | ICD-10-CM

## 2017-03-04 DIAGNOSIS — E1140 Type 2 diabetes mellitus with diabetic neuropathy, unspecified: Secondary | ICD-10-CM | POA: Diagnosis not present

## 2017-03-04 DIAGNOSIS — I70229 Atherosclerosis of native arteries of extremities with rest pain, unspecified extremity: Secondary | ICD-10-CM

## 2017-03-04 DIAGNOSIS — I6529 Occlusion and stenosis of unspecified carotid artery: Secondary | ICD-10-CM

## 2017-03-04 DIAGNOSIS — I998 Other disorder of circulatory system: Secondary | ICD-10-CM

## 2017-03-04 LAB — T4, FREE: Free T4: 1.28 ng/dL (ref 0.60–1.60)

## 2017-03-04 LAB — TSH: TSH: <0.01 u[IU]/mL — ABNORMAL LOW (ref 0.35–4.50)

## 2017-03-04 LAB — HEMOGLOBIN A1C: HEMOGLOBIN A1C: 11.3 % — AB (ref 4.6–6.5)

## 2017-03-04 NOTE — Progress Notes (Signed)
Patient ID: [name] [name], female   DOB: [birth year], 83 y.o.   MRN: [id number]  HPI: [name] [name] is a 83 y.o.-year-old female, returning for f/u for DM2, dx in ~2006, insulin-dependent, uncontrolled, with complications (peripheral neuropathy, CAD, cerebrovascular ds - s/p stroke 12/2016, CKD). Last visit 3.5 mo ago.  She is here with her son who offers most of the hx as patient is mostly nonverbal (explains dietary pattern and insulin dosing). She "sneaks sweets" per her son. He and his brother hide the sweets.  DM2: Last hemoglobin A1c was: Lab Results  Component Value Date   HGBA1C 12.0 (H) 10/03/2016   HGBA1C 9.2 (H) 05/29/2016   HGBA1C 11.4 (H) 01/11/2016   Pt was on a regimen of: - Glipizide 5 mg 2x a day before meals - Lantus 18 units in am added 04/2015 - in am >> no missed doses She was on Metformin ER 750 mg 2x a day >> stopped b/c diarrhea.  Now on: - NovoLog 70/30 - started 11/2016: - (takes 10 units in am, 12 units before dinner) - 14 units before a meal with dessert  Pt checks her sugars 2x a day - am: 90-147 >> 100-160 but occasionally 230-260 (eats during the night) >> 90-130 - 2h after brunch: n/c >> 123, 189, 199 >> n/c >> 120-150 - before lunch: n/c - 2h after lunch: n/c >> 226 >> n/c - before dinner: n/c >> 160-200, lately: 150-170 - 2h after dinner: n/c >> 200-230, lately: 150-190 - bedtime: n/c   - nighttime: n/c >> 120-150 >> n/c No lows. Lowest sugar was 90 >> 90s. ? If she had hypoglycemia awareness.  Highest sugar was 269 >> 230s.  Glucometer: AccuChek  Pt's meals are: - Breakfast: cold or hot cereal + Glucerna - Lunch: small sandwich - Dinner: vegetables + meat + bread + Glucerna - Snacks: 1/2 banana  - + CKD, last BUN/creatinine:  Lab Results  Component Value Date   BUN 30 (H) 10/18/2016   CREATININE 1.40 (H) 10/18/2016   - last set of lipids: Lab Results  Component Value Date   CHOL 135 10/03/2016   HDL 23.80 (L) 10/03/2016   LDLCALC  152 (H) 01/11/2016   LDLDIRECT 49.0 10/03/2016   TRIG 326.0 (H) 10/03/2016   CHOLHDL 6 10/03/2016   - last eye exam was in 09/2015: No DR. + cataract OU >> implants. - she has numbness and tingling in her feet. + neuropathy. She continues Hydrocodone for neuropathic pain. This is the only thing that helps. Tried Lyrica and Neurontin >> did not help.  She also has hypothyroidism:  Reviewed TSH levels >> lately suppressed, but prev. Noncompliant with LT4 and dose kept being increased Lab Results  Component Value Date   TSH <0.01 (L) 12/23/2016   TSH <0.01 (L) 11/14/2016   TSH <0.01 (L) 10/03/2016   TSH 0.23 (L) 05/29/2016   TSH 0.31 (L) 01/10/2016   TSH 5.20 (H) 10/06/2015   FREET4 1.42 12/23/2016   FREET4 1.53 11/14/2016   FREET4 0.95 10/06/2015   FREET4 1.04 06/27/2015   FREET4 1.13 05/03/2015   Pt is on levothyroxine 88 mcg daily (decreased dose 12/2016), taken: - in am - fasting - at least 30 min from b'fast - no Ca, Fe -+ later in the day: MVI, PPIs - not on Biotin  ROS: Constitutional: no weight gain/no weight loss, no fatigue, no subjective hyperthermia, no subjective hypothermia Eyes: no blurry vision, no xerophthalmia ENT: no sore throat, no nodules palpated in  throat, no dysphagia, no odynophagia, no hoarseness Cardiovascular: no CP/no SOB/no palpitations/no leg swelling Respiratory: no cough/no SOB/no wheezing Gastrointestinal: no N/no V/no D/no C/no acid reflux Musculoskeletal: no muscle aches/no joint aches Skin: no rashes, no hair loss Neurological: no tremors//no dizziness  I reviewed pt's medications, allergies, PMH, social hx, family hx, and changes were documented in the history of present illness. Otherwise, unchanged from my initial visit note.  Past Medical History:  Diagnosis Date  . Chronic combined systolic and diastolic CHF, NYHA class 3 (HCC)    a. 03/2013 Echo: EF 25-30%, diff HK, sev antsept HK, mild MR.  . Congestive dilated cardiomyopathy  (HCC)    a. 03/2013 Echo: EF 25-30%;  b. 09/2013 s/p SJM 3242 CRT-P.  Marland Kitchen Coronary artery disease    a. 08/2012 Cath: LM nl, LAD 29m, LCX nondom, mild-mod nonobs dzs mid, OM1 ok, OM2 90/90p, RCA 40, EF 25-30%-->Med Rx.  . GERD (gastroesophageal reflux disease)   . High cholesterol   . Hypertension   . Hypothyroidism   . Left bundle branch block   . Osteoarthritis   . Stroke (HCC)   . Type II diabetes mellitus (HCC)    Past Surgical History:  Procedure Laterality Date  . ABDOMINAL HYSTERECTOMY  1980  . BI-VENTRICULAR PACEMAKER INSERTION N/A 10/01/2013   Procedure: BI-VENTRICULAR PACEMAKER INSERTION (CRT-P);  Surgeon: Duke Salvia, MD;  Location: [facility] CATH LAB;  Service: Cardiovascular;  Laterality: N/A;  . BI-VENTRICULAR PACEMAKER INSERTION (CRT-P)     a. 09/2013 s/p SJM 3242 CRT-P.  . Bilateral cateract surgery    . BMD  2006  . IR GENERIC HISTORICAL  10/18/2016   IR ANGIO INTRA EXTRACRAN SEL COM CAROTID INNOMINATE BILAT MOD SED 10/18/2016 Julieanne Cotton, MD [facility]-INTERV RAD  . IR GENERIC HISTORICAL  10/18/2016   IR ANGIO VERTEBRAL SEL SUBCLAVIAN INNOMINATE BILAT MOD SED 10/18/2016 Julieanne Cotton, MD [facility]INTER[facility]  . ORIF TIBIA PLATEAU Right 12/04/2013   Procedure: OPEN REDUCTION INTERNAL FIXATION (ORIF) TIBIAL PLATEAU;  Surgeon: Verlee Rossetti, MD;  Location: [facility] ORS;  Service: Orthopedics;  Laterality: Right;  . PERIPHERAL VASCULAR CATHETERIZATION N/A 06/17/2016   Procedure: Lower Extremity Angiography;  Surgeon: Runell Gess, MD;  Location: [facility] INVASIVE CV LAB;  Service: Cardiovascular;  Laterality: N/A;  . PERIPHERAL VASCULAR CATHETERIZATION Left 06/17/2016   Procedure: Peripheral Vascular Atherectomy;  Surgeon: Runell Gess, MD;  Location: [facility] INVASIVE CV LAB;  Service: Cardiovascular;  Laterality: Left;  Popliteal   . RIGHT HEART CATHETERIZATION N/A 09/08/2012   Procedure: RIGHT HEART CATH;  Surgeon: Dolores Patty, MD;  Location: [facility] CATH LAB;  Service: Cardiovascular;  Laterality:  N/A;   Social History   Social History  . Marital status: Divorced    Spouse name: N/A  . Number of children: 4  . Years of education: N/A   Occupational History  . Retired Engineer, civil (consulting)    Social History Main Topics  . Smoking status: Former Games developer  . Smokeless tobacco: Former Neurosurgeon  . Alcohol use No  . Drug use: No  . Sexual activity: Not Currently   Other Topics Concern  . Not on file   Social History Narrative   Widowed.  Lives with her son.  Normally able to ambulate without assistance.  Quit smoking as a teenager.              Current Outpatient Prescriptions on File Prior to Visit  Medication Sig Dispense Refill  . aspirin EC 325 MG tablet Take 325 mg  by mouth daily.    . carvedilol (COREG) 12.5 MG tablet Take 6.25 mg by mouth 2 (two) times daily.    . clopidogrel (PLAVIX) 75 MG tablet TAKE ONE TABLET BY MOUTH ONCE DAILY 90 tablet 2  . co-enzyme Q-10 30 MG capsule Take 30 mg by mouth daily.    Marland Kitchen FREESTYLE LITE test strip USE ONE STRIP TO CHECK GLUCOSE TWICE DAILY 200 each 3  . furosemide (LASIX) 20 MG tablet Take 20 mg by mouth daily as needed for fluid.     Marland Kitchen glipiZIDE (GLUCOTROL) 10 MG tablet Take 5 mg by mouth 2 (two) times daily before a meal.    . hydrOXYzine (ATARAX/VISTARIL) 25 MG tablet Take 25 mg by mouth 3 (three) times daily as needed for itching.    Marland Kitchen ibuprofen (ADVIL,MOTRIN) 200 MG tablet Take 400 mg by mouth daily as needed for headache or moderate pain.    Marland Kitchen insulin aspart protamine - aspart (NOVOLOG MIX 70/30 FLEXPEN) (70-30) 100 UNIT/ML FlexPen Inject 0.12-0.14 mLs (12-14 Units total) into the skin 2 (two) times daily before a meal. 15 mL 5  . levothyroxine (SYNTHROID, LEVOTHROID) 88 MCG tablet Take 1 tablet (88 mcg total) by mouth daily. 90 tablet 0  . lisinopril (PRINIVIL,ZESTRIL) 5 MG tablet Take 1 tablet (5 mg total) by mouth 2 (two) times daily. 180 tablet 3  . Omega-3 Fatty Acids (EQL OMEGA 3 FISH OIL) 1200 MG CAPS Take 1,200 mg by mouth daily.    .  pantoprazole (PROTONIX) 40 MG tablet Take 1 tablet (40 mg total) by mouth daily. 30 tablet 6  . rosuvastatin (CRESTOR) 20 MG tablet TAKE ONE TABLET BY MOUTH ONCE DAILY 90 tablet 2  . Vitamin D, Ergocalciferol, (DRISDOL) 50000 units CAPS capsule Take 1 capsule by mouth every 7 (seven) days. (MONDAYS)     No current facility-administered medications on file prior to visit.    Allergies  Allergen Reactions  . Statins Other (See Comments)    Leg cramping  . Metformin And Related     Pt stopped it due to diarrhea  . Vytorin [Ezetimibe-Simvastatin] Other (See Comments)    Leg cramps   Family History  Problem Relation Age of Onset  . Diabetes Mother   . Diabetes Other   . Hypertension Other   . Uterine cancer Other   . Heart attack Brother    PE: BP 110/60 (BP Location: Left Arm, Patient Position: Sitting)   Pulse 64   Wt 132 lb (59.9 kg)   SpO2 95%   BMI 21.97 kg/m  Body mass index is 21.97 kg/m. Wt Readings from Last 3 Encounters:  03/04/17 132 lb (59.9 kg)  02/25/17 133 lb 6.4 oz (60.5 kg)  01/28/17 133 lb (60.3 kg)   Constitutional: normal weight, in NAD Eyes: PERRLA, EOMI, no exophthalmos ENT: moist mucous membranes, no thyromegaly, no cervical lymphadenopathy Cardiovascular: RRR, No MRG Respiratory: CTA B Gastrointestinal: abdomen soft, NT, ND, BS+ Musculoskeletal: no deformities, strength intact in all 4 Skin: moist, warm, no rashes Neurological: + mild tremor with outstretched hands, DTR normal in all 4   ASSESSMENT: 1. DM2, insulin-dependent, uncontrolled, with complications - Peripheral neuropathy - CAD - cerebrovascular ds - s/p stroke 12/2016  2. Hypothyroidism   PLAN:  1. Patient with long-standing, very uncontrolled diabetes,with last HbA1c very high, at 12.0%. At last visit, we switched to 70/30   insulin, and, per son's report (no log, no meter), her sugars are better. They improved further after they hid the  sweets in the house.   She is not taking  the recommended dose in the morning, and subsequently, sugars are high before dinner. We discussed about  increasing her a.m. insulin dose. - They stopped insulin after last visit, and we'll continue off  - I suggested to:  Patient Instructions  Please increase NovoLog 70/30 to: - 12 units before a meal without dessert - 14 units before a meal with dessert  Continue to stay off Glipizide.  Please continue Levothyroxine 88 mcg.  Take the thyroid hormone every day, with water, at least 30 minutes before breakfast, separated by at least 4 hours from: - acid reflux medications - calcium - iron - multivitamins  Please come back for a follow-up appointment in 3 months.  - today will check HbA1c - continue checking sugars at different times of the day - check 2x a day, rotating checks - advised for yearly eye exams >> she  Needs one  - Return to clinic in 3 mo with sugar log    2. Hypothyroidism - h/o noncompliance with her daily levothyroxine dose  - latest thyroid labs reviewed with pt >> TSH still low >> decreased LT4 further - she continues on LT4 88 mcg daily (last decrease in dose) - pt feels good on this dose. - we discussed about taking the thyroid hormone every day, with water, >30 minutes before breakfast, separated by >4 hours from acid reflux medications, calcium, iron, multivitamins. Pt. is taking it correctly. - will check thyroid tests today: TSH and fT4 - If labs are abnormal, she will need to return for repeat TFTs in 1.5 months  Component     Latest Ref Rng & Units 03/04/2017  TSH     0.35 - 4.50 uIU/mL <0.01 (L)  Hemoglobin A1C     4.6 - 6.5 % 11.3 (H)  T4,Free(Direct)     0.60 - 1.60 ng/dL 1.19   JYN8G still very high. TSH remains suppressed. I am not sure if she is actually taking the 88 g daily, however, her son assures me that she does. In this case, we'll decrease her dose to 50 g daily and recheck when she comes back.  Carlus Pavlov, MD PhD [facility]  Endocrinology

## 2017-03-04 NOTE — Patient Instructions (Addendum)
Please increase NovoLog 70/30 to: - 12 units before a meal without dessert - 14 units before a meal with dessert  Continue to stay off Glipizide.  Please continue Levothyroxine 88 mcg.  Take the thyroid hormone every day, with water, at least 30 minutes before breakfast, separated by at least 4 hours from: - acid reflux medications - calcium - iron - multivitamins  Please come back for a follow-up appointment in 3 months.

## 2017-03-05 ENCOUNTER — Ambulatory Visit: Payer: Medicare Other

## 2017-03-05 ENCOUNTER — Telehealth: Payer: Self-pay

## 2017-03-05 MED ORDER — LEVOTHYROXINE SODIUM 50 MCG PO TABS: 50.0000 ug | ORAL_TABLET | Freq: Every day | ORAL | 1 refills | Status: DC

## 2017-03-05 NOTE — Telephone Encounter (Signed)
LVM, gave lab results. Gave call back number if any questions or concerns.  

## 2017-03-05 NOTE — Telephone Encounter (Signed)
-----   Message from Carlus Pavlov, MD sent at 03/05/2017  2:05 PM EDT ----- Raynelle Fanning, can you please call pt:  HbA1c still very high. TSH remains low. She needs tol decrease her dose to 50 g daily (please tell her it is a white pill) and recheck in 3 months when she comes back.

## 2017-03-06 ENCOUNTER — Other Ambulatory Visit: Payer: Self-pay

## 2017-03-06 ENCOUNTER — Telehealth: Payer: Self-pay | Admitting: Cardiovascular Disease

## 2017-03-06 NOTE — Telephone Encounter (Signed)
Spoke with pt son--ok per DPR, gave results for LEA doppler.

## 2017-03-06 NOTE — Patient Outreach (Signed)
[facility]) Care Management  [facility] Care Manager  03/06/2017   Kerrigan Rupright 05-09-1934 [id number]   Home visit completed with patient.  Subjective:  Objective:  Encounter Medications:  Outpatient Encounter Prescriptions as of 03/06/2017  Medication Sig  . aspirin EC 325 MG tablet Take 325 mg by mouth daily.  . carvedilol (COREG) 12.5 MG tablet Take 6.25 mg by mouth 2 (two) times daily.  . clopidogrel (PLAVIX) 75 MG tablet TAKE ONE TABLET BY MOUTH ONCE DAILY  . co-enzyme Q-10 30 MG capsule Take 30 mg by mouth daily.  Marland Kitchen FREESTYLE LITE test strip USE ONE STRIP TO CHECK GLUCOSE TWICE DAILY  . furosemide (LASIX) 20 MG tablet Take 20 mg by mouth daily as needed for fluid.   Marland Kitchen glipiZIDE (GLUCOTROL) 10 MG tablet Take 5 mg by mouth 2 (two) times daily before a meal.  . hydrOXYzine (ATARAX/VISTARIL) 25 MG tablet Take 25 mg by mouth 3 (three) times daily as needed for itching.  Marland Kitchen ibuprofen (ADVIL,MOTRIN) 200 MG tablet Take 400 mg by mouth daily as needed for headache or moderate pain.  Marland Kitchen insulin aspart protamine - aspart (NOVOLOG MIX 70/30 FLEXPEN) (70-30) 100 UNIT/ML FlexPen Inject 0.12-0.14 mLs (12-14 Units total) into the skin 2 (two) times daily before a meal.  . levothyroxine (SYNTHROID, LEVOTHROID) 50 MCG tablet Take 1 tablet (50 mcg total) by mouth daily.  Marland Kitchen lisinopril (PRINIVIL,ZESTRIL) 5 MG tablet Take 1 tablet (5 mg total) by mouth 2 (two) times daily.  . Omega-3 Fatty Acids (EQL OMEGA 3 FISH OIL) 1200 MG CAPS Take 1,200 mg by mouth daily.  . pantoprazole (PROTONIX) 40 MG tablet Take 1 tablet (40 mg total) by mouth daily.  . rosuvastatin (CRESTOR) 20 MG tablet TAKE ONE TABLET BY MOUTH ONCE DAILY  . Vitamin D, Ergocalciferol, (DRISDOL) 50000 units CAPS capsule Take 1 capsule by mouth every 7 (seven) days. (MONDAYS)   No facility-administered encounter medications on file as of 03/06/2017.     Functional Status:  In your present state of health, do you have any  difficulty performing the following activities: 11/06/2016 11/01/2016  Hearing? Malvin Johns  Vision? Y Y  Difficulty concentrating or making decisions? Malvin Johns  Walking or climbing stairs? Y Y  Dressing or bathing? Y Y  Doing errands, shopping? Malvin Johns  Preparing Food and eating ? Y Y  Using the Toilet? N N  In the past six months, have you accidently leaked urine? Y Y  Do you have problems with loss of bowel control? N N  Managing your Medications? Y Y  Managing your Finances? Malvin Johns  Housekeeping or managing your Housekeeping? Y Y  Some recent data might be hidden    Fall/Depression Screening: Fall Risk  02/25/2017 11/06/2016 11/01/2016  Falls in the past year? No Yes Yes  Number falls in past yr: - 2 or more 2 or more  Injury with Fall? - Yes Yes  Risk Factor Category  - High Fall Risk High Fall Risk  Risk for fall due to : - Impaired mobility;Impaired balance/gait;History of fall(s) Impaired mobility;Impaired vision;Impaired balance/gait  Follow up - Falls evaluation completed;Education provided;Falls prevention discussed Falls prevention discussed;Education provided   PHQ 2/9 Scores 11/06/2016 11/01/2016  PHQ - 2 Score 0 2  PHQ- 9 Score - 4    Assessment:  Patient continues to have elevated blood sugars despite her son stating that her sugars have been better. They are checking her sugars twice a day, in the morning and at  other times during the day. After reviewing her meter, she has had several readings over 400. Since 02/09/2017, patient has had 2 HI readings. Blood sugars have ranged from 119 to 441. Her sons state that she still eats sweets. They indicate that they hide them from her, but she finds them and sneaks them anyway.  Patient saw her endocrinologist on 03/04/2017. Her weight at that appointment was 132 lbs Patient stated that her insulin was changed. She stated that she is taking 12 units before a meal and takes more if she has a dessert (14 units). She also stated that her thyroid medicine  was decreased. Patient unable to verbalize what her A1C was, but after review, it was noted to be 11.3.    RNCM provided education to patient and her sons regarding diet. Discussed with her sons about bringing foods into the home that cause her sugar to be more elevated. Encouraged to monitor what she is eating and can begin to decrease those things that they see are causing elevations. Sons are responsible for grocery shopping and patient does not pick up any groceries on her own. Encouraged them to make healthier choices and if there is something that they feel is causing more issues with her sugar, then they should not bring it into the home for her to be tempted to "sneak it" and eat it. Patient's sons verbalized understanding and stated that would watch what they bring into the home. Also advised that patient is not restricted and does not have to give up any of her favorite things. Encouraged to introduce them to her in moderation and limit (but not eliminate) any foods that are causing more elevations in her sugars.  Plan: Will follow up with patient in a month.  Turkey R. Mallerie Blok, RN, BSN, CCM [facility] Care Management Coordinator [phone]

## 2017-03-12 ENCOUNTER — Telehealth: Payer: Self-pay

## 2017-03-12 MED ORDER — GLIPIZIDE 10 MG PO TABS: 5.0000 mg | ORAL_TABLET | Freq: Two times a day (BID) | ORAL | 1 refills | Status: DC

## 2017-03-12 NOTE — Telephone Encounter (Signed)
[facility] faxed rx request glipizide 10 mg. erx sent.

## 2017-03-20 ENCOUNTER — Other Ambulatory Visit: Payer: Self-pay

## 2017-03-20 MED ORDER — INSULIN ASPART PROT & ASPART (70-30 MIX) 100 UNIT/ML PEN: 12.0000 [IU] | PEN_INJECTOR | Freq: Two times a day (BID) | SUBCUTANEOUS | 5 refills | Status: DC

## 2017-04-01 ENCOUNTER — Other Ambulatory Visit: Payer: Self-pay

## 2017-04-01 NOTE — Patient Outreach (Addendum)
[facility]) Care Management  [facility] Care Manager  04/01/2017   [name] [name] 20-Sep-1933 [id number]  Home visit completed with patient and her son, [name].   Subjective: "My blood sugars have not been doing any better."  Objective:    Encounter Medications:  Outpatient Encounter Prescriptions as of 04/01/2017  Medication Sig  . aspirin EC 325 MG tablet Take 325 mg by mouth daily.  . carvedilol (COREG) 12.5 MG tablet Take 6.25 mg by mouth 2 (two) times daily.  . clopidogrel (PLAVIX) 75 MG tablet TAKE ONE TABLET BY MOUTH ONCE DAILY  . co-enzyme Q-10 30 MG capsule Take 30 mg by mouth daily.  Marland Kitchen FREESTYLE LITE test strip USE ONE STRIP TO CHECK GLUCOSE TWICE DAILY  . furosemide (LASIX) 20 MG tablet Take 20 mg by mouth daily as needed for fluid.   Marland Kitchen glipiZIDE (GLUCOTROL) 10 MG tablet Take 0.5 tablets (5 mg total) by mouth 2 (two) times daily before a meal.  . hydrOXYzine (ATARAX/VISTARIL) 25 MG tablet Take 25 mg by mouth 3 (three) times daily as needed for itching.  Marland Kitchen ibuprofen (ADVIL,MOTRIN) 200 MG tablet Take 400 mg by mouth daily as needed for headache or moderate pain.  Marland Kitchen insulin aspart protamine - aspart (NOVOLOG MIX 70/30 FLEXPEN) (70-30) 100 UNIT/ML FlexPen Inject 0.12-0.14 mLs (12-14 Units total) into the skin 2 (two) times daily before a meal.  . levothyroxine (SYNTHROID, LEVOTHROID) 50 MCG tablet Take 1 tablet (50 mcg total) by mouth daily.  Marland Kitchen lisinopril (PRINIVIL,ZESTRIL) 5 MG tablet Take 1 tablet (5 mg total) by mouth 2 (two) times daily.  . Omega-3 Fatty Acids (EQL OMEGA 3 FISH OIL) 1200 MG CAPS Take 1,200 mg by mouth daily.  . pantoprazole (PROTONIX) 40 MG tablet Take 1 tablet (40 mg total) by mouth daily.  . rosuvastatin (CRESTOR) 20 MG tablet TAKE ONE TABLET BY MOUTH ONCE DAILY  . Vitamin D, Ergocalciferol, (DRISDOL) 50000 units CAPS capsule Take 1 capsule by mouth every 7 (seven) days. (MONDAYS)   No facility-administered encounter medications on  file as of 04/01/2017.     Functional Status:  In your present state of health, do you have any difficulty performing the following activities: 11/06/2016 11/01/2016  Hearing? Y Y  Comment - HOH, declines audiology referral at this time  Vision? 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 Y Y  Comment - s/p stroke, has residual cognitive dificulty  Walking or climbing stairs? Y Y  Comment - uses a cane to steady gait, climbs small flights of steps with caution  Dressing or bathing? [name] [name]  Comment - family assists  Doing errands, shopping? [name] [name]  Comment - family Network engineer and eating ? Y Y  Comment - family assistance  Using the Toilet? N N  In the past six months, have you accidently leaked urine? Y Y  Comment - occassionally has leaks  Do you have problems with loss of bowel control? N N  Managing your Medications? [name] [name]  Comment - family assistance  Managing your Finances? [name] [name]  Comment - family assistance  Housekeeping or managing your Housekeeping? Y Y  Comment - family assistance  Some recent data might be hidden    Fall/Depression Screening: Fall Risk  02/25/2017 11/06/2016 11/01/2016  Falls in the past year? No Yes Yes  Comment - - -  Number falls in past yr: - 2 or  more 2 or more  Comment - - -  Injury with Fall? - Yes Yes  Risk Factor Category  - High Fall Risk High Fall Risk  Risk for fall due to : - Impaired mobility;Impaired balance/gait;History of fall(s) Impaired mobility;Impaired vision;Impaired balance/gait  Follow up - Falls evaluation completed;Education provided;Falls prevention discussed Falls prevention discussed;Education provided   PHQ 2/9 Scores 11/06/2016 11/01/2016  PHQ - 2 Score 0 2  PHQ- 9 Score - 4    Assessment: Patient reported that she has not seen any changes in her blood sugars. She stated that they are still running high and she does not know  why. Patient and her son reported that they have cut back on her fruit and she is drinking water. Patient stated she really has not been eating that much. RNCM reminded patient that it is very important to continue to eat in order to better stabilize her blood sugar and discouraged her from skipping meals. Patient has been consistently checking her blood sugar twice a day and her son has been giving her insulin to her twice a day as well. He reported that they are routinely giving her 12 units with her morning and evening meals per her PCP instructions. He stated that they have struggled with this because her doctor wanted her to take the insulin prior to the meal, but many times in the evening, she has been snacking. RNCM encouraged them to make sure that they go ahead and give her insulin to her even if she has been snacking prior to a meal. He did not state that they have not been giving her evening insulin, but it did appear that he had struggled with those instructions given the fact that she has been eating snacks up to her evening meal.  RNCM reviewed patient's blood sugars for the month of August through this morning. Today's fasting blood sugar was 140. Patient's morning blood sugars range from 81 to 288. Patient's evening blood sugars range from 172 to HI (highest actual reading was 486).   7 day average:  260 14 day average: 248 30 day average:  238 These averages are very similar to readings from 3 months ago, demonstrating no significant changes in her sugars. Patient continues to experience more elevated blood sugars in her evening readings.   These are the actual readings for the month of August:  Date AM BS Time PM BS Time  8/21 140 951    8/20 165 [phone]  8/19 136 1010 380 2014  8/18 288 [phone]  8/17 145 515 165 2940  8/16 169 [phone]  8/15 81 812 HI 1854     486 1855  8/14 162 [phone]  8/13 158 424 123 2106  8/12 198 1048 290 2006  8/11 108 938 298  1951  8/10 246 [phone]  8/9 170 [phone]  8/8 206 [phone]  8/7 107 [phone]  8/6 156 [phone]  8/5 193 [phone]  8/4 88 [phone]  8/3 106 [phone]  8/2 117 1012 292 1839  8/1 111 [phone]   Patient has no other complaints or concerns. She is very worried about her blood sugar being high despite her making changes. [name] reported that they have been watching foods that she eats and have been trying to remove those that they can see are causing higher blood sugar readings such  as cherries and graham crackers. He stated that when patient ate something like cherries followed by a carb, her sugars would be significantly elevated. RNCM provided education (again) about providing patient with a complex carbohydrate AND a protein (with examples). They both verbalized understanding, but have not yet attempted to do this despite education during every home visit. Patient has certain foods that she likes and she eats what she wants when no one is at home with her.   Plan: RNCM advised will send updated blood sugar readings to Dr. Lafe Garin for review. Patient's next appointment is  06/03/2017.  Patient to continue checking blood sugars twice a day and taking medications as prescribed.  RNCM to follow up with patient within next month and she was agreeable.  [facility] CM Care Plan Problem One     Most Recent Value  Care Plan Problem One  Uncontrolled diabetes as evidenced by A1C of 11.3 in July 2018  Role Documenting the Problem One  Care Management Coordinator  Care Plan for Problem One  Active  [facility] Long Term Goal   Patient will reduce A1C by at least 2 points within the next 90 days  [facility] Long Term Goal Start Date  04/01/17  Interventions for Problem One Long Term Goal  RNCM provided education about the importance of monitoring blood glucose, taking medications as prescribed and watching how many carbohydrates she eats in a day      Turkey R. Ebbie Sorenson, RN, BSN,  CCM [facility] Care Management Coordinator [phone]

## 2017-04-21 ENCOUNTER — Telehealth: Payer: Self-pay | Admitting: Cardiology

## 2017-04-21 ENCOUNTER — Encounter: Payer: Medicare Other | Admitting: *Deleted

## 2017-04-21 ENCOUNTER — Other Ambulatory Visit: Payer: Self-pay

## 2017-04-21 NOTE — Telephone Encounter (Signed)
LMOVM reminding pt to send remote transmission.   

## 2017-04-21 NOTE — Patient Outreach (Signed)
[facility]) Care Management  04/21/17  [name] [name] [birth year] [id number]  Successful outreach completed with patient's son, [name]. Patient identification verified.  Selena Batten reported that patient had been doing much better and seems to be a little more disciplined. He reported that he and his brother are being more cautious with the foods that they are bringing into the home because she tends to eat all the sweets at one time. They are still treating her occasionally, but limiting to around once a week. He stated that he does not think they have seen any high blood sugar readings in a while now.  Plan: Home visit scheduled for next week.   Turkey R. Natalina Wieting, RN, BSN, CCM [facility] Care Management Coordinator [phone]

## 2017-04-25 ENCOUNTER — Encounter: Payer: Self-pay | Admitting: Cardiology

## 2017-04-30 ENCOUNTER — Ambulatory Visit: Payer: Self-pay

## 2017-05-12 ENCOUNTER — Ambulatory Visit (INDEPENDENT_AMBULATORY_CARE_PROVIDER_SITE_OTHER): Payer: Medicare Other | Admitting: *Deleted

## 2017-05-12 DIAGNOSIS — I428 Other cardiomyopathies: Secondary | ICD-10-CM | POA: Diagnosis not present

## 2017-05-12 NOTE — Progress Notes (Signed)
Remote pacemaker transmission.   

## 2017-05-14 ENCOUNTER — Encounter: Payer: Self-pay | Admitting: Cardiology

## 2017-05-15 LAB — CUP PACEART REMOTE DEVICE CHECK
Battery Remaining Longevity: 87 mo
Battery Voltage: 2.96 V
Brady Statistic AP VP Percent: 28 %
Brady Statistic AP VS Percent: 1 %
Brady Statistic AS VP Percent: 71 %
Brady Statistic RA Percent Paced: 28 %
Implantable Lead Implant Date: 20150220
Implantable Lead Location: 753860
Implantable Pulse Generator Implant Date: 20150220
Lead Channel Impedance Value: 430 Ohm
Lead Channel Impedance Value: 990 Ohm
Lead Channel Pacing Threshold Amplitude: 0.875 V
Lead Channel Pacing Threshold Pulse Width: 0.4 ms
Lead Channel Pacing Threshold Pulse Width: 0.4 ms
Lead Channel Pacing Threshold Pulse Width: 1 ms
Lead Channel Sensing Intrinsic Amplitude: 10.3 mV
Lead Channel Setting Pacing Amplitude: 2.25 V
Lead Channel Setting Pacing Pulse Width: 1 ms
MDC IDC LEAD IMPLANT DT: 20150220
MDC IDC LEAD IMPLANT DT: 20150220
MDC IDC LEAD LOCATION: 753858
MDC IDC LEAD LOCATION: 753859
MDC IDC MSMT BATTERY REMAINING PERCENTAGE: 95.5 %
MDC IDC MSMT LEADCHNL LV PACING THRESHOLD AMPLITUDE: 1.25 V
MDC IDC MSMT LEADCHNL RA IMPEDANCE VALUE: 390 Ohm
MDC IDC MSMT LEADCHNL RA SENSING INTR AMPL: 2.3 mV
MDC IDC MSMT LEADCHNL RV PACING THRESHOLD AMPLITUDE: 1.125 V
MDC IDC PG SERIAL: 7578583
MDC IDC SESS DTM: 20180930144058
MDC IDC SET LEADCHNL RA PACING AMPLITUDE: 1.875
MDC IDC SET LEADCHNL RV PACING AMPLITUDE: 2.125
MDC IDC SET LEADCHNL RV PACING PULSEWIDTH: 0.4 ms
MDC IDC SET LEADCHNL RV SENSING SENSITIVITY: 2 mV
MDC IDC STAT BRADY AS VS PERCENT: 1 %

## 2017-05-22 ENCOUNTER — Ambulatory Visit (INDEPENDENT_AMBULATORY_CARE_PROVIDER_SITE_OTHER): Payer: Medicare Other | Admitting: Podiatry

## 2017-05-22 DIAGNOSIS — B351 Tinea unguium: Secondary | ICD-10-CM | POA: Diagnosis not present

## 2017-05-22 DIAGNOSIS — M79609 Pain in unspecified limb: Secondary | ICD-10-CM

## 2017-05-22 DIAGNOSIS — E1140 Type 2 diabetes mellitus with diabetic neuropathy, unspecified: Secondary | ICD-10-CM

## 2017-05-22 DIAGNOSIS — E1151 Type 2 diabetes mellitus with diabetic peripheral angiopathy without gangrene: Secondary | ICD-10-CM

## 2017-05-22 NOTE — Progress Notes (Signed)
Subjective:    Patient ID: [name] [name], female   DOB: 83 y.o.   MRN: [id number]   HPI patient presents with significant nail disease 1-5 both feet which are thick yellow brittle and they are painful    ROS      Objective:  Physical Exam neurovascular status intact with thick yellow brittle type painful nailbeds 1-5 both feet     Assessment:   Mycotic nail infection with pain 1-5 both feet     Plan:    Debris painful nailbeds 1-5 both feet with no iatrogenic bleeding noted

## 2017-05-26 DIAGNOSIS — E113593 Type 2 diabetes mellitus with proliferative diabetic retinopathy without macular edema, bilateral: Secondary | ICD-10-CM | POA: Diagnosis not present

## 2017-05-27 ENCOUNTER — Other Ambulatory Visit: Payer: Self-pay | Admitting: Internal Medicine

## 2017-05-30 ENCOUNTER — Encounter: Payer: Self-pay | Admitting: Cardiology

## 2017-05-30 ENCOUNTER — Other Ambulatory Visit: Payer: Self-pay

## 2017-05-30 NOTE — Patient Outreach (Signed)
[facility]) Care Management  05/30/17  [name] [name] 12-09-1933 [id number]  Attempted to reach patient's son without success. Left HIPAA compliant voicemail with RNCM contact information and invited to call back.  Benetta Spar R. Kawika Bischoff, RN, BSN, CCM [facility] Care Management Coordinator [phone]

## 2017-06-03 ENCOUNTER — Encounter: Payer: Self-pay | Admitting: Internal Medicine

## 2017-06-03 ENCOUNTER — Ambulatory Visit (INDEPENDENT_AMBULATORY_CARE_PROVIDER_SITE_OTHER): Payer: Medicare Other | Admitting: Internal Medicine

## 2017-06-03 VITALS — BP 122/70 | HR 62 | Wt 141.0 lb

## 2017-06-03 DIAGNOSIS — E039 Hypothyroidism, unspecified: Secondary | ICD-10-CM | POA: Diagnosis not present

## 2017-06-03 DIAGNOSIS — Z23 Encounter for immunization: Secondary | ICD-10-CM

## 2017-06-03 DIAGNOSIS — I6529 Occlusion and stenosis of unspecified carotid artery: Secondary | ICD-10-CM | POA: Diagnosis not present

## 2017-06-03 DIAGNOSIS — E1140 Type 2 diabetes mellitus with diabetic neuropathy, unspecified: Secondary | ICD-10-CM | POA: Diagnosis not present

## 2017-06-03 DIAGNOSIS — Z794 Long term (current) use of insulin: Secondary | ICD-10-CM

## 2017-06-03 LAB — T4, FREE: Free T4: 0.78 ng/dL (ref 0.60–1.60)

## 2017-06-03 LAB — POCT GLYCOSYLATED HEMOGLOBIN (HGB A1C): Hemoglobin A1C: 10.2

## 2017-06-03 LAB — TSH: TSH: 0.91 u[IU]/mL (ref 0.35–4.50)

## 2017-06-03 NOTE — Progress Notes (Addendum)
Patient ID: [name] [name], female   DOB: [birth year], 83 y.o.   MRN: [id number]  HPI: [name] [name] is a 83 y.o.-year-old female, returning for f/u for DM2, dx in ~2006, insulin-dependent, uncontrolled, with complications (peripheral neuropathy, CAD, cerebrovascular ds - s/p stroke 12/2016, CKD). Last visit 3 mo ago.  She is here with one of her sons who offers most of the hx as pt is mostly nonverbal.She still "sneaks sweets" between meals per her son. They are hiding sweets.  She gained 9 lbs since last visit.  DM2: Last hemoglobin A1c was: Lab Results  Component Value Date   HGBA1C 11.3 (H) 03/04/2017   HGBA1C 12.0 (H) 10/03/2016   HGBA1C 9.2 (H) 05/29/2016   Pt was on a regimen of: - Glipizide 5 mg 2x a day before meals - Lantus 18 units in am added 04/2015 - in am >> no missed doses She was on Metformin ER 750 mg 2x a day >> stopped b/c diarrhea.  Now on: - Glipizide  - NovoLog 70/30 - started 11/2016: - 12 units before a meal without dessert - 14 units before a meal with dessert  Pt checks her sugars 2x a day: - am: 90-147 >> 100-160, occ. 230-260 (eats during the night) >> 90-130 >> 92, but lately 180-190 - 2h after brunch: n/c >> 123, 189, 199 >> n/c >> 120-150 >> n/c - before lunch: n/c - 2h after lunch: n/c >> 226 >> n/c - before dinner: n/c >> 160-200, lately: 150-170 >> n/c - 2h after dinner: n/c >> 200-230, lately: 150-190 >> >200-300s - bedtime: n/c   - nighttime: n/c >> 120-150 >> n/c Lowest sugar was 90 >> 90s >> 90. ? If she had hypoglycemia awareness.  Highest sugar was 269 >> 230s >> 300s.  Glucometer: AccuChek  Pt's meals are: - Breakfast: cold or hot cereal + Glucerna - Lunch: small sandwich - Dinner: vegetables + meat + bread + Glucerna - Snacks: 1/2 banana, grapes, candy, crackers  - She has CKD, last BUN/creatinine:  Lab Results  Component Value Date   BUN 30 (H) 10/18/2016   CREATININE 1.40 (H) 10/18/2016  On Lisinopril. - She has HL; last  set of lipids: Lab Results  Component Value Date   CHOL 135 10/03/2016   HDL 23.80 (L) 10/03/2016   LDLCALC 152 (H) 01/11/2016   LDLDIRECT 49.0 10/03/2016   TRIG 326.0 (H) 10/03/2016   CHOLHDL 6 10/03/2016  On Crestor 20. Stopped Fish Oil. - last eye exam was in 05/26/2017 >> No DR. + cataract OU >> implants. Has bifocal glasses now. - + numbness and tingling in her feet. + neuropathy.  She is on Hydrocodone for neuropathic pain. This is the only thing that helps. Tried Lyrica and Neurontin >> did not help.  She also has hypothyroidism:  Reviewed TSH levels >> she was previously noncompliant with LT4 and dose kept being increased: Lab Results  Component Value Date   TSH <0.01 (L) 03/04/2017   TSH <0.01 (L) 12/23/2016   TSH <0.01 (L) 11/14/2016   TSH <0.01 (L) 10/03/2016   TSH 0.23 (L) 05/29/2016   TSH 0.31 (L) 01/10/2016   FREET4 1.28 03/04/2017   FREET4 1.42 12/23/2016   FREET4 1.53 11/14/2016   FREET4 0.95 10/06/2015   FREET4 1.04 06/27/2015   FREET4 1.13 05/03/2015   Pt is on levothyroxine 50 mcg daily (decreased dose 02/2017), taken: - in am - fasting - at least 30 min from b'fast - no Ca, Fe - +  later in the day: MVI, PPIs - not on Biotin  ROS: Constitutional: no weight gain/no weight loss (but weight gain of 9 pounds per our scale), no fatigue, no subjective hyperthermia, no subjective hypothermia Eyes: no blurry vision, no xerophthalmia ENT: no sore throat, no nodules palpated in throat, no dysphagia, no odynophagia, no hoarseness Cardiovascular: no CP/no SOB/no palpitations/no leg swelling Respiratory: no cough/no SOB/no wheezing Gastrointestinal: no N/no V/no D/no C/no acid reflux Musculoskeletal: no muscle aches/no joint aches Skin: no rashes, no hair loss Neurological: no tremors/+ numbness/+ tingling/no dizziness  I reviewed pt's medications, allergies, PMH, social hx, family hx, and changes were documented in the history of present illness. Otherwise,  unchanged from my initial visit note.  Past Medical History:  Diagnosis Date  . Chronic combined systolic and diastolic CHF, NYHA class 3 (HCC)    a. 03/2013 Echo: EF 25-30%, diff HK, sev antsept HK, mild MR.  . Congestive dilated cardiomyopathy (HCC)    a. 03/2013 Echo: EF 25-30%;  b. 09/2013 s/p SJM 3242 CRT-P.  Marland Kitchen Coronary artery disease    a. 08/2012 Cath: LM nl, LAD 4m, LCX nondom, mild-mod nonobs dzs mid, OM1 ok, OM2 90/90p, RCA 40, EF 25-30%-->Med Rx.  . GERD (gastroesophageal reflux disease)   . High cholesterol   . Hypertension   . Hypothyroidism   . Left bundle branch block   . Osteoarthritis   . Stroke (HCC)   . Type II diabetes mellitus (HCC)    Past Surgical History:  Procedure Laterality Date  . ABDOMINAL HYSTERECTOMY  1980  . BI-VENTRICULAR PACEMAKER INSERTION N/A 10/01/2013   Procedure: BI-VENTRICULAR PACEMAKER INSERTION (CRT-P);  Surgeon: Duke Salvia, MD;  Location: [facility] CATH LAB;  Service: Cardiovascular;  Laterality: N/A;  . BI-VENTRICULAR PACEMAKER INSERTION (CRT-P)     a. 09/2013 s/p SJM 3242 CRT-P.  . Bilateral cateract surgery    . BMD  2006  . IR GENERIC HISTORICAL  10/18/2016   IR ANGIO INTRA EXTRACRAN SEL COM CAROTID INNOMINATE BILAT MOD SED 10/18/2016 Julieanne Cotton, MD [facility]-INTERV RAD  . IR GENERIC HISTORICAL  10/18/2016   IR ANGIO VERTEBRAL SEL SUBCLAVIAN INNOMINATE BILAT MOD SED 10/18/2016 Julieanne Cotton, MD [facility]INTER[facility]  . ORIF TIBIA PLATEAU Right 12/04/2013   Procedure: OPEN REDUCTION INTERNAL FIXATION (ORIF) TIBIAL PLATEAU;  Surgeon: Verlee Rossetti, MD;  Location: [facility] ORS;  Service: Orthopedics;  Laterality: Right;  . PERIPHERAL VASCULAR CATHETERIZATION N/A 06/17/2016   Procedure: Lower Extremity Angiography;  Surgeon: Runell Gess, MD;  Location: [facility] INVASIVE CV LAB;  Service: Cardiovascular;  Laterality: N/A;  . PERIPHERAL VASCULAR CATHETERIZATION Left 06/17/2016   Procedure: Peripheral Vascular Atherectomy;  Surgeon: Runell Gess, MD;  Location: [facility]  INVASIVE CV LAB;  Service: Cardiovascular;  Laterality: Left;  Popliteal   . RIGHT HEART CATHETERIZATION N/A 09/08/2012   Procedure: RIGHT HEART CATH;  Surgeon: Dolores Patty, MD;  Location: [facility] CATH LAB;  Service: Cardiovascular;  Laterality: N/A;   Social History   Social History  . Marital status: Divorced    Spouse name: N/A  . Number of children: 4  . Years of education: N/A   Occupational History  . Retired Engineer, civil (consulting)    Social History Main Topics  . Smoking status: Former Games developer  . Smokeless tobacco: Former Neurosurgeon  . Alcohol use No  . Drug use: No  . Sexual activity: Not Currently   Other Topics Concern  . Not on file   Social History Narrative   Widowed.  Lives with  her son.  Normally able to ambulate without assistance.  Quit smoking as a teenager.              Current Outpatient Prescriptions on File Prior to Visit  Medication Sig Dispense Refill  . aspirin EC 325 MG tablet Take 325 mg by mouth daily.    . carvedilol (COREG) 12.5 MG tablet Take 6.25 mg by mouth 2 (two) times daily.    . clopidogrel (PLAVIX) 75 MG tablet TAKE ONE TABLET BY MOUTH ONCE DAILY 90 tablet 2  . co-enzyme Q-10 30 MG capsule Take 30 mg by mouth daily.    Marland Kitchen. FREESTYLE LITE test strip USE ONE STRIP TO CHECK GLUCOSE TWICE DAILY 200 each 3  . furosemide (LASIX) 20 MG tablet Take 20 mg by mouth daily as needed for fluid.     Marland Kitchen. glipiZIDE (GLUCOTROL) 10 MG tablet Take 0.5 tablets (5 mg total) by mouth 2 (two) times daily before a meal. 180 tablet 1  . hydrOXYzine (ATARAX/VISTARIL) 25 MG tablet Take 25 mg by mouth 3 (three) times daily as needed for itching.    Marland Kitchen. ibuprofen (ADVIL,MOTRIN) 200 MG tablet Take 400 mg by mouth daily as needed for headache or moderate pain.    Marland Kitchen. insulin aspart protamine - aspart (NOVOLOG MIX 70/30 FLEXPEN) (70-30) 100 UNIT/ML FlexPen Inject 0.12-0.14 mLs (12-14 Units total) into the skin 2 (two) times daily before a meal. 15 mL 5  . levothyroxine (SYNTHROID, LEVOTHROID) 50  MCG tablet Take 1 tablet (50 mcg total) by mouth daily. 90 tablet 1  . lisinopril (PRINIVIL,ZESTRIL) 5 MG tablet Take 1 tablet (5 mg total) by mouth 2 (two) times daily. 180 tablet 3  . Omega-3 Fatty Acids (EQL OMEGA 3 FISH OIL) 1200 MG CAPS Take 1,200 mg by mouth daily.    . pantoprazole (PROTONIX) 40 MG tablet Take 1 tablet (40 mg total) by mouth daily. 30 tablet 6  . rosuvastatin (CRESTOR) 20 MG tablet TAKE ONE TABLET BY MOUTH ONCE DAILY 90 tablet 2  . Vitamin D, Ergocalciferol, (DRISDOL) 50000 units CAPS capsule Take 1 capsule by mouth every 7 (seven) days. (MONDAYS)     No current facility-administered medications on file prior to visit.    Allergies  Allergen Reactions  . Statins Other (See Comments)    Leg cramping  . Metformin And Related     Pt stopped it due to diarrhea  . Vytorin [Ezetimibe-Simvastatin] Other (See Comments)    Leg cramps   Family History  Problem Relation Age of Onset  . Diabetes Mother   . Diabetes Other   . Hypertension Other   . Uterine cancer Other   . Heart attack Brother    PE: BP 122/70 (BP Location: Left Arm, Patient Position: Sitting)   Pulse 62   Wt 141 lb (64 kg)   SpO2 90%   BMI 23.46 kg/m  Body mass index is 23.46 kg/m. Wt Readings from Last 3 Encounters:  06/03/17 141 lb (64 kg)  03/04/17 132 lb (59.9 kg)  02/25/17 133 lb 6.4 oz (60.5 kg)   Constitutional: normal weight, in NAD Eyes: PERRLA, EOMI, no exophthalmos ENT: moist mucous membranes, no thyromegaly, no cervical lymphadenopathy Cardiovascular: RRR, No MRG Respiratory: CTA B Gastrointestinal: abdomen soft, NT, ND, BS+ Musculoskeletal: no deformities, strength intact in all 4 Skin: moist, warm, no rashes Neurological: + mild tremor with outstretched hands, DTR normal in all 4   ASSESSMENT: 1. DM2, insulin-dependent, uncontrolled, with complications - Peripheral neuropathy - CAD -  cerebrovascular ds - s/p stroke 12/2016  2. Hypothyroidism   PLAN:  1. Patient with  long-standing, very uncontrolled diabetes, on premixed insulin regimen. At last visit, we increased her 70/30 insulin doses but her sugars are still high- today, HbA1c is 10.2% (slightly better) - Her son is telling me that she is still sneaking desserts/snacks between meals,Although we discussed at last visit that she is allowed to have something sweet after meals, but she needs to inject more insulin at that time.  - At this visit, we discussed about increasing her 70/30 insulin doses since there are no lows and her HbA1c is slightly better after the last increase  - I suggested to:  Patient Instructions  Please increase NovoLog 70/30: - 15 units before a meal without dessert - 18 units before a meal with dessert  Try to eat your sweets after a meal, not by themselves.  Please continue Levothyroxine 50 mcg.  Take the thyroid hormone every day, with water, at least 30 minutes before breakfast, separated by at least 4 hours from: - acid reflux medications - calcium - iron - multivitamins  Please stop at the lab.  Please come back for a follow-up appointment in 3 months.  - continue checking sugars at different times of the day - check 2-3x a day, rotating checks - advised for yearly eye exams >> she is UTD - Will give her the flu shot today - Return to clinic in 3 mo with sugar log     2. Hypothyroidism - h/o noncompliance with her daily levothyroxine dose  - latest thyroid labs reviewed with pt >> TSH still low >> we decreased LT4 to 50 mcg daily - she continues on LT4 50 mcg daily - we discussed about taking the thyroid hormone every day, with water, >30 minutes before breakfast, separated by >4 hours from acid reflux medications, calcium, iron, multivitamins. Pt. is taking it correctly. - will check thyroid tests today: TSH and fT4 - If labs are abnormal, she will need to return for repeat TFTs in 1.5 months - OTW, RTC in 3 mo  Office Visit on 06/03/2017  Component Date Value  Ref Range Status  . TSH 06/03/2017 0.91  0.35 - 4.50 uIU/mL Final  . Free T4 06/03/2017 0.78  0.60 - 1.60 ng/dL Final   Comment: Specimens from patients who are undergoing biotin therapy and /or ingesting biotin supplements may contain high levels of biotin.  The higher biotin concentration in these specimens interferes with this Free T4 assay.  Specimens that contain high levels  of biotin may cause false high results for this Free T4 assay.  Please interpret results in light of the total clinical presentation of the patient.    . Hemoglobin A1C 06/03/2017 10.2   Final   Normal TFTs >> continue 50 mcg LT4 daily.  Carlus Pavlov, MD PhD [facility] Endocrinology

## 2017-06-03 NOTE — Patient Instructions (Addendum)
Please increase NovoLog 70/30: - 15 units before a meal without dessert - 18 units before a meal with dessert  Try to eat your sweets after a meal, not by themselves.  Please continue Levothyroxine 50 mcg.  Take the thyroid hormone every day, with water, at least 30 minutes before breakfast, separated by at least 4 hours from: - acid reflux medications - calcium - iron - multivitamins  Please stop at the lab.  Please come back for a follow-up appointment in 3 months.

## 2017-06-04 ENCOUNTER — Ambulatory Visit: Payer: Self-pay

## 2017-06-04 IMAGING — DX DG CHEST 1V PORT
1 series · 1 of 1 positions shown · non-contrast
Comparison: Chest radiograph performed 12/03/2013

CLINICAL DATA: Acute onset of dizziness and lightheadedness.
Hyperglycemia. Initial encounter.

EXAM:
PORTABLE CHEST 1 VIEW

[chest ap]
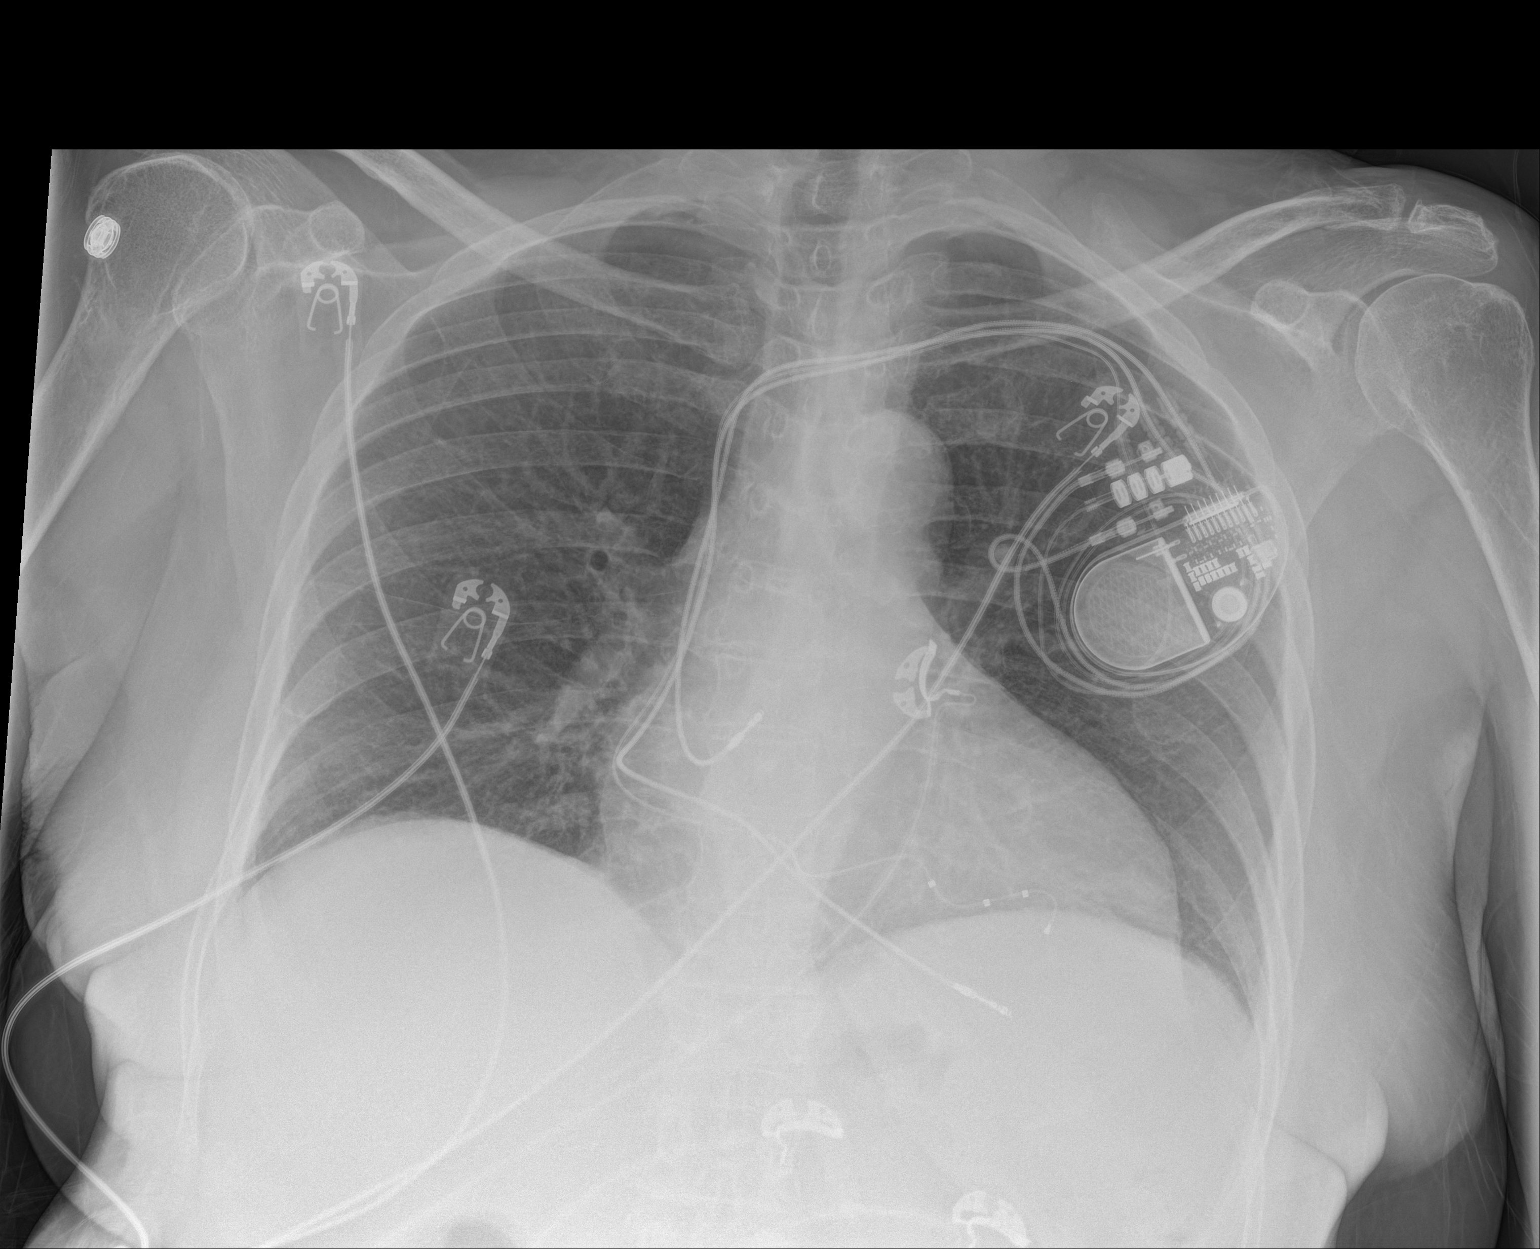

[1 of 1 positions shown; findings below may reference images not displayed]

FINDINGS: The lungs are well-aerated and clear. There is no evidence of focal
opacification, pleural effusion or pneumothorax.

The cardiomediastinal silhouette is borderline normal in size. A
pacemaker is noted overlying the left chest wall, with leads ending
overlying the right atrium, right ventricle and coronary sinus. No
acute osseous abnormalities are seen.
IMPRESSION: No acute cardiopulmonary process seen.

## 2017-06-14 IMAGING — CT CT ANGIO HEAD
1 of 10 series · 1 of 35 positions shown · IV contrast (Iohexol (Omnipaque 350))
Comparison: Noncontrast head CT 01/10/2016.

CLINICAL DATA: 81-year-old female with ataxia and right side
headache for multiple days. Right PCA infarct on noncontrast head CT
yesterday. Initial encounter.

EXAM:
CT ANGIOGRAPHY HEAD AND NECK
TECHNIQUE: Multidetector CT imaging of the head and neck was performed using
the standard protocol during bolus administration of intravenous
contrast. Multiplanar CT image reconstructions and MIPs were
obtained to evaluate the vascular anatomy. Carotid stenosis
measurements (when applicable) are obtained utilizing NASCET
criteria, using the distal internal carotid diameter as the
denominator.
CONTRAST:  50 mL Isovue 370

[Series 200: locator · axial · 0.57mm/px · 1 of 1 slices shown]
[im 1/1  soft-tissue]
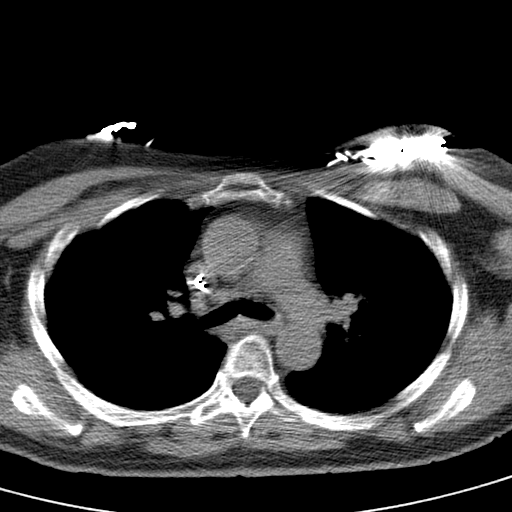

[1 of 35 positions shown; findings below may reference images not displayed]

FINDINGS: CTA NECK

Skeleton: Multilevel degenerative changes in the cervical spine.
Partially visible thoracic levoconvex scoliosis. Absent dentition.
No acute osseous abnormality identified.

Minimal posterior right ethmoid sinus mucosal thickening. Other
Visualized paranasal sinuses and mastoids are clear.

Other neck: Left chest cardiac pacemaker type device. Negative lung
apices. No superior mediastinal lymphadenopathy. Small volume of gas
distending the upper thoracic esophagus, nonspecific.

Negative thyroid, larynx (glottis is closed), pharynx,
parapharyngeal spaces, retropharyngeal space, sublingual space,
submandibular glands and parotid glands. No cervical
lymphadenopathy.

Aortic arch: 3 vessel arch configuration. Mild mostly soft arch
atherosclerotic plaque.

Right carotid system: Tortuous proximal right CCA. Calcified plaque
at the posterior right ICA origin and bulb resulting in less than 50
% stenosis with respect to the distal vessel. Mildly tortuous
cervical right ICA.

Left carotid system: Tortuous left CCA. Bulky calcified plaque at
the left carotid bifurcation and left ICA origin resulting in
radiographic string sign stenosis over a segment of 8-10 mm (series
403, image 117). The left ICA remains patent, and is mildly tortuous
distal to the bulb.

Vertebral arteries:No proximal right subclavian artery stenosis
despite calcified plaque. Normal right vertebral artery origin.
Tortuous right V2 segment. Otherwise negative right vertebral artery
to the skullbase.

50% stenosis of the proximal left subclavian artery related the
calcified plaque. Dense calcified plaque at the left vertebral
artery origin resulting in severe stenosis (series 403, image 136).
The left vertebral artery rib remains patent. The vertebral arteries
are fairly codominant. The left V2 segment is tortuous. Otherwise
negative left vertebral artery to the skullbase.

CTA HEAD

Posterior circulation: No distal right vertebral artery stenosis.
Calcified plaque in the proximal left V4 segment which is not
significant. Normal left PICA and right AICA origins. Normal
vertebrobasilar junction. No basilar stenosis. Normal SCA and PCA
origins. However, there is high-grade stenosis in the right PCA P1
segment (series 405, image 22), and the right PCA occludes in the P2
segment best seen on series 403 images 93 through 95. Furthermore
there are tandem moderate to severe stenoses in the left PCA P1 and
P2 segments (series 406, image 31).

Anterior circulation: Both ICA siphons are patent, but there is
severe bilateral calcified atherosclerosis resulting an
hemodynamically significant stenosis at the distal cavernous and
proximal supraclinoid segments on the left as well as the proximal
supraclinoid segment on the right. Right ophthalmic artery origin
remains normal. Carotid termini remain patent.

There is a mild stenosis at the left ACA origin. A1 segments,
anterior communicating artery, and bilateral ACA branches then are
within normal limits. Left MCA origin is normal. Left M1 segment,
left MCA bifurcation, and left MCA branches are within normal
limits. Right MCA origin, right M1 segment, bifurcation, and right
MCA branches are within normal limits.

Venous sinuses: Patent.

Anatomic variants: None.

Delayed phase: Cytotoxic edema in the right PCA territory involving
the occipital lobe, the mesial temporal lobe, an the right
hippocampal formation appears stable. No associated hemorrhage. No
significant mass effect. Stable gray-white matter differentiation
elsewhere. No abnormal enhancement identified.
IMPRESSION: 1. Positive for emergent large vessel occlusion of the right PCA in
the P2 segment. High-grade stenosis in the right P1 segment.
2. Other hemodynamically significant intracranial stenoses:
- Bilateral ICA siphons, related to calcified plaque.
- Left PCA P1 and P2 segments.
3. High-grade RADIOGRAPHIC STRING SIGN stenosis of the left ICA
origin.
4. Severe stenosis of the left vertebral artery origin due to
calcified plaque.
5. Right carotid bifurcation atherosclerosis without significant
stenosis. 50% stenosis proximal left subclavian artery.
6. Stable CT appearance of the confluent right PCA territory
infarcts since yesterday. No associated hemorrhage or significant
mass effect. No new intracranial abnormality identified.
Study discussed by telephone with [REDACTED] Provider Julmat

## 2017-06-19 ENCOUNTER — Other Ambulatory Visit: Payer: Self-pay | Admitting: Cardiology

## 2017-06-19 NOTE — Telephone Encounter (Signed)
This is Dr. Berry's pt 

## 2017-06-19 NOTE — Telephone Encounter (Signed)
Rx(s) sent to pharmacy electronically.  

## 2017-07-16 ENCOUNTER — Other Ambulatory Visit: Payer: Self-pay

## 2017-07-16 NOTE — Patient Outreach (Signed)
[facility]) Care Management  07/16/17  Herberta Guerreiro Jul 22, 1934 [id number]   Attempted to reach patient/her son, Debraann Diller without success. Left HIPAA compliant voicemail with RNCM contact information and invited to return call.  Turkey R. Judea Riches, RN, BSN, CCM [facility] Care Management Coordinator [phone]

## 2017-07-21 ENCOUNTER — Other Ambulatory Visit: Payer: Self-pay

## 2017-07-21 NOTE — Patient Outreach (Signed)
[facility]) Care Management  07/21/17  Brittinie Sare 1934-06-19  [id number]  Second attempt to reach patient and her son, Shardonnay Tabor. Left HIPAA compliant voicemail with RNCM contact information and invited patient to return call.  Turkey R. Earlie Arciga, RN, BSN, CCM [facility] Care Management Coordinator [phone]

## 2017-07-22 ENCOUNTER — Other Ambulatory Visit: Payer: Self-pay

## 2017-07-28 ENCOUNTER — Ambulatory Visit: Payer: Self-pay

## 2017-08-11 ENCOUNTER — Ambulatory Visit (INDEPENDENT_AMBULATORY_CARE_PROVIDER_SITE_OTHER): Payer: Medicare Other | Admitting: *Deleted

## 2017-08-11 DIAGNOSIS — I428 Other cardiomyopathies: Secondary | ICD-10-CM | POA: Diagnosis not present

## 2017-08-11 NOTE — Progress Notes (Signed)
Remote pacemaker transmission.   

## 2017-08-14 LAB — CUP PACEART REMOTE DEVICE CHECK
Battery Remaining Percentage: 95.5 %
Brady Statistic AP VP Percent: 32 %
Brady Statistic AP VS Percent: 1 %
Brady Statistic AS VS Percent: 1 %
Brady Statistic RA Percent Paced: 32 %
Implantable Lead Implant Date: 20150220
Implantable Lead Location: 753859
Implantable Lead Location: 753860
Lead Channel Impedance Value: 380 Ohm
Lead Channel Impedance Value: 980 Ohm
Lead Channel Pacing Threshold Amplitude: 1.25 V
Lead Channel Pacing Threshold Amplitude: 1.25 V
Lead Channel Pacing Threshold Pulse Width: 0.4 ms
Lead Channel Sensing Intrinsic Amplitude: 12 mV
Lead Channel Setting Pacing Amplitude: 2.25 V
Lead Channel Setting Pacing Pulse Width: 0.4 ms
Lead Channel Setting Sensing Sensitivity: 2 mV
MDC IDC LEAD IMPLANT DT: 20150220
MDC IDC LEAD IMPLANT DT: 20150220
MDC IDC LEAD LOCATION: 753858
MDC IDC MSMT BATTERY REMAINING LONGEVITY: 87 mo
MDC IDC MSMT BATTERY VOLTAGE: 2.96 V
MDC IDC MSMT LEADCHNL LV PACING THRESHOLD PULSEWIDTH: 1 ms
MDC IDC MSMT LEADCHNL RA PACING THRESHOLD AMPLITUDE: 0.75 V
MDC IDC MSMT LEADCHNL RA SENSING INTR AMPL: 2.3 mV
MDC IDC MSMT LEADCHNL RV IMPEDANCE VALUE: 410 Ohm
MDC IDC MSMT LEADCHNL RV PACING THRESHOLD PULSEWIDTH: 0.4 ms
MDC IDC PG IMPLANT DT: 20150220
MDC IDC PG SERIAL: 7578583
MDC IDC SESS DTM: 20181231090126
MDC IDC SET LEADCHNL LV PACING PULSEWIDTH: 1 ms
MDC IDC SET LEADCHNL RA PACING AMPLITUDE: 1.75 V
MDC IDC SET LEADCHNL RV PACING AMPLITUDE: 2.25 V
MDC IDC STAT BRADY AS VP PERCENT: 68 %

## 2017-08-15 ENCOUNTER — Encounter: Payer: Self-pay | Admitting: Cardiology

## 2017-08-15 ENCOUNTER — Other Ambulatory Visit: Payer: Self-pay | Admitting: *Deleted

## 2017-08-15 NOTE — Patient Outreach (Signed)
[facility]) Care Management  08/15/2017  [name] [name] August 06, 1934 [id number]    Initial outreach call today RN spoke with pt's son Sache Palas). RN explained the purpose of the call an inquired if pt was able to provide a verbal consent to talk with him. Son indicated she could and requested RN to follow up on Monday around 1030-1100 to proceed with the verbal consent and possible services with [facility]. RN will scheduled and follow up accordingly.   Elliot Cousin, RN Care Management Coordinator [facility] Main Office [phone]

## 2017-08-15 NOTE — Progress Notes (Signed)
Letter  

## 2017-08-18 ENCOUNTER — Other Ambulatory Visit: Payer: Self-pay | Admitting: *Deleted

## 2017-08-18 NOTE — Patient Outreach (Addendum)
[facility]) Care Management  08/18/2017  [name] [name] [birth year] [id number]    RN spoke with son initially who was not with the pt at the time of this call and requested RN to contact the pt directly and provided her number. RN contacted the pt and introduced the [facility] program and services and inquired if this was a good time to talk. Pt curious about the topics to discussed but did not wish to verified identifiers (only obtained her name) at this time and requested a call back. RN inquired on time and day to return a call and pt responded she wasn't sure but she had company at this time and it was not a good time to talk. RN offered to follow up tomorrow. Will follow up tomorrow and inquire further on pt's needs at that time.  Elliot Cousin, RN Care Management Coordinator [facility] Main Office [phone]

## 2017-08-19 ENCOUNTER — Other Ambulatory Visit: Payer: Self-pay | Admitting: *Deleted

## 2017-08-19 NOTE — Patient Outreach (Signed)
[facility]) Care Management  08/19/2017  [name] [name] 1934-05-24 [id number]    RN spoke with pt today and reintroduced [facility] and purpose for today's call. Confirmed pt's identifiers however reluctantly initially. RN informed pt that there was a recent discussion with her son Selena Batten) who provided this rn with her direct contact number. Pt more receptive and updated RN on her diabetes indicating her numbers are "still high" and she is aware of her A1C currently at 13. RN strongly encouraged pt on how important it is to manage this condition due to the risk involved (pt verbalized an understanding). Educated pt on the importance of managing her dietary intake with low carbohydration and adherence to administration on all her prescribed medications. Pt verified she is trying to eat healthier and confirmed she is taking all her medications. RN offered home visits for community disease management related to her diabetes. Initially pt decline but RN spoke on the risk involved and strongly encouraged pt to take advantage of the opportunity to make changes in his life that would benefit her in managing her diabetes. Pt reconsidered and was receptive to case manager visiting and requested RN to contact her son to arrange an appointments next week. RN further informed pt that she needs to commit to working with this RN case manager toward the goals that will be document and sent to her provider to review (pt agreed).  Attempted to contact pt's son Selena Batten) and left a voice message requesting a call back.  Elliot Cousin, RN Care Management Coordinator [facility] Main Office [phone]

## 2017-08-20 ENCOUNTER — Other Ambulatory Visit: Payer: Self-pay | Admitting: *Deleted

## 2017-08-20 NOTE — Patient Outreach (Signed)
[facility]) Care Management  08/20/2017  Chatara Delduca 01-13-1934 [id number]  2nd outreach attempt  Unsuccessful outreach to the pt's son Selena Batten) but RN able to leave a HIPAA approved voice message requesting a call back. Will schedule a home visit appointment at that time to proceed with [facility] services for this pt.   Elliot Cousin, RN Care Management Coordinator [facility] Main Office [phone]

## 2017-08-21 ENCOUNTER — Ambulatory Visit: Payer: Self-pay | Admitting: *Deleted

## 2017-08-22 ENCOUNTER — Other Ambulatory Visit: Payer: Medicare Other

## 2017-08-28 ENCOUNTER — Encounter: Payer: Self-pay | Admitting: *Deleted

## 2017-08-28 ENCOUNTER — Other Ambulatory Visit: Payer: Self-pay | Admitting: *Deleted

## 2017-08-28 NOTE — Patient Outreach (Addendum)
[facility]) Care Management   08/28/2017  [name] [name] Nov 26, 1933 [id number]  [name] [name] is an 84 y.o. female  Subjective:  DIABETES:  Pt states has had diabetes or a long time but does not really remember much on the detail but aware her numbers are too high. Pt aware of her A1C and knows she needs to reduce this number but again not quite sure how to reduce this number.  Pt reports she takes her CBGs twice daily and has help from her two sons. MOBILITY:   Pt is willing to make the needed changes to improve her diabetes and indicates she is willing to increase her activities with exercises 2-3 days weekly by walking in or outside her home. Currently pt uses a cane when ambulating to keep her balance.  A.D: Pt declined the offer to be provided a packet but indicated she does not wish to be a full code but again has no parameters in place for a limited code at this time.    Objective:  Review of Systems  Constitutional: Negative.   HENT: Negative.   Eyes: Negative.   Respiratory: Negative.   Cardiovascular: Negative.   Gastrointestinal: Negative.   Genitourinary: Negative.   Musculoskeletal: Negative.   Skin: Negative.   Neurological: Negative.   Endo/Heme/Allergies: Negative.   Psychiatric/Behavioral: Negative.     Physical Exam  Constitutional: She is oriented to person, place, and time. She appears well-developed and well-nourished.  HENT:  Right Ear: External ear normal.  Left Ear: External ear normal.  Eyes: EOM are normal.  Neck: Normal range of motion.  Cardiovascular: Normal heart sounds.  Respiratory: Effort normal and breath sounds normal.  GI: Soft. Bowel sounds are normal.  Musculoskeletal: Normal range of motion.  Neurological: She is alert and oriented to person, place, and time.  Skin: Skin is warm and dry.  Psychiatric: She has a normal mood and affect. Her behavior is normal. Judgment and thought content normal.    Encounter  Medications:   Outpatient Encounter Medications as of 08/28/2017  Medication Sig  . aspirin EC 325 MG tablet Take 325 mg by mouth daily.  . carvedilol (COREG) 12.5 MG tablet Take 6.25 mg by mouth 2 (two) times daily.  . clopidogrel (PLAVIX) 75 MG tablet TAKE ONE TABLET BY MOUTH ONCE DAILY  . FREESTYLE LITE test strip USE ONE STRIP TO CHECK GLUCOSE TWICE DAILY  . furosemide (LASIX) 20 MG tablet Take 20 mg by mouth daily as needed for fluid.   . hydrOXYzine (ATARAX/VISTARIL) 25 MG tablet Take 25 mg by mouth 3 (three) times daily as needed for itching.  Marland Kitchen ibuprofen (ADVIL,MOTRIN) 200 MG tablet Take 400 mg by mouth daily as needed for headache or moderate pain.  Marland Kitchen insulin aspart protamine - aspart (NOVOLOG MIX 70/30 FLEXPEN) (70-30) 100 UNIT/ML FlexPen Inject 0.12-0.14 mLs (12-14 Units total) into the skin 2 (two) times daily before a meal.  . levothyroxine (SYNTHROID, LEVOTHROID) 50 MCG tablet Take 1 tablet (50 mcg total) by mouth daily.  Marland Kitchen lisinopril (PRINIVIL,ZESTRIL) 5 MG tablet Take 1 tablet (5 mg total) by mouth 2 (two) times daily.  . pantoprazole (PROTONIX) 40 MG tablet TAKE ONE TABLET BY MOUTH ONCE DAILY  . rosuvastatin (CRESTOR) 20 MG tablet TAKE ONE TABLET BY MOUTH ONCE DAILY  . Vitamin D, Ergocalciferol, (DRISDOL) 50000 units CAPS capsule Take 1 capsule by mouth every 7 (seven) days. (MONDAYS)  . co-enzyme Q-10 30 MG capsule Take 30 mg by mouth daily.  Marland Kitchen  Omega-3 Fatty Acids (EQL OMEGA 3 FISH OIL) 1200 MG CAPS Take 1,200 mg by mouth daily.   No facility-administered encounter medications on file as of 08/28/2017.     Functional Status:   In your present state of health, do you have any difficulty performing the following activities: 08/28/2017 11/06/2016  Hearing? - Y  Comment - -  Vision? N Y  Comment - left peripheral vision is gone  Difficulty concentrating or making decisions? N Y  Comment - -  Walking or climbing stairs? Y Y  Comment - -  Dressing or bathing? N Y  Comment - -   Doing errands, shopping? Y Y  Comment - -  Preparing Food and eating ? N Y  Comment - -  Using the Toilet? N N  In the past six months, have you accidently leaked urine? Y Y  Comment - -  Do you have problems with loss of bowel control? N N  Managing your Medications? Y Y  Comment - -  Managing your Finances? Y Y  Comment - -  Housekeeping or managing your Housekeeping? N Y  Comment - -  Some recent data might be hidden    Fall/Depression Screening:    Fall Risk  08/28/2017 02/25/2017 11/06/2016  Falls in the past year? No No Yes  Comment - - -  Number falls in past yr: - - 2 or more  Comment - - -  Injury with Fall? - - Yes  Risk Factor Category  - - High Fall Risk  Risk for fall due to : - - Impaired mobility;Impaired balance/gait;History of fall(s)  Follow up - - Falls evaluation completed;Education provided;Falls prevention discussed   PHQ 2/9 Scores 08/28/2017 11/06/2016 11/01/2016  PHQ - 2 Score 0 0 2  PHQ- 9 Score - - 4   BP 120/60 (BP Location: Right Arm, Patient Position: Sitting, Cuff Size: Normal)   Pulse 63   Resp 20   Ht 1.651 m (5\' 5" )   Wt 130 lb (59 kg)   SpO2 96%   BMI 21.63 kg/m  Assessment:   [facility] Consent for community case management Case management related Diabetes No activities related to limited mobility A.D related to a limited code status  Plan:  Will obtain a signed consent for [facility] services and explained the purpose of [facility] services for case management related to her diabetes. Will educate pt on her diabetes and the importance of diabetes lowering her A1C and over managing her diabetes with making changes to improve her overall diabetes. Will distance possible changes in her daily routine that would assist with lowering her A1C. Will generate a plan of care surrounding her diabetes in managing this medical condition. Educated pt on hypo-hyperglycemia and the possible signs or symptoms that maybe encountered if not monitored daily. Will verify pt's  daily CBG and discuss her 02-22-29 day average accordingly to the readouts on her Accu-Chek device (ranges over 200). Will verify pt's understanding compared to the normal range and further discuss possible changes that can result in better readings such as changes in her diet and/or increase in her daily exercises or activities.   Will discuss a level of exercise that is conducive to pt's tolerance level and/or a possible weekly regimen to assist with the improvement of her overall diabetes. Will discussed safety measures with her increase in activities and stress the use of her cane to prevent risk of falls.  Will discussed the Living Will packet and will provided the  MOST form along with a A.D on the next home visit. Will also provide a THN calender and a pill box due to the older box pt's currently has for her medication administeration. Based upon the above information a plan of care was generated related to lowering pt's A1C, adherence with daily CBGs and increased in daily exercises to assist with improving her diabetes. Pt receptive and will adhere to the discussed plan. Will alert primary provided pt's disposition with [facility] services and update on quarterly reports. Will scheduled the next follow up in one month and continue to assist pt in managing her diabetes over the next several months based upon the plan of care, interventions and goals.   Elliot Cousin, RN Care Management Coordinator [facility] Main Office [phone]

## 2017-08-29 ENCOUNTER — Ambulatory Visit: Payer: Medicare Other | Admitting: Nurse Practitioner

## 2017-08-29 ENCOUNTER — Encounter: Payer: Self-pay | Admitting: *Deleted

## 2017-09-01 ENCOUNTER — Other Ambulatory Visit: Payer: Self-pay | Admitting: Internal Medicine

## 2017-09-04 ENCOUNTER — Ambulatory Visit: Payer: Medicare Other | Admitting: Internal Medicine

## 2017-09-09 ENCOUNTER — Encounter: Payer: Self-pay | Admitting: Internal Medicine

## 2017-09-09 ENCOUNTER — Ambulatory Visit (INDEPENDENT_AMBULATORY_CARE_PROVIDER_SITE_OTHER): Payer: Medicare Other | Admitting: Internal Medicine

## 2017-09-09 VITALS — BP 118/60 | HR 63 | Ht 65.0 in | Wt 143.4 lb

## 2017-09-09 DIAGNOSIS — E1140 Type 2 diabetes mellitus with diabetic neuropathy, unspecified: Secondary | ICD-10-CM | POA: Diagnosis not present

## 2017-09-09 DIAGNOSIS — Z794 Long term (current) use of insulin: Secondary | ICD-10-CM | POA: Diagnosis not present

## 2017-09-09 DIAGNOSIS — E039 Hypothyroidism, unspecified: Secondary | ICD-10-CM

## 2017-09-09 LAB — POCT GLYCOSYLATED HEMOGLOBIN (HGB A1C): Hemoglobin A1C: 9.4

## 2017-09-09 MED ORDER — INSULIN ASPART PROT & ASPART (70-30 MIX) 100 UNIT/ML PEN
18.0000 [IU] | PEN_INJECTOR | Freq: Two times a day (BID) | SUBCUTANEOUS | 5 refills | Status: DC
Start: 2017-09-09 — End: 2018-02-14

## 2017-09-09 NOTE — Addendum Note (Signed)
Addended by: Ariabella Brien on: 09/09/2017 03:48 PM   Modules accepted: Orders  

## 2017-09-09 NOTE — Patient Instructions (Addendum)
Please increase NovoLog 70/30: - 18 units before b'fast - 18 units before dinner  Please continue Levothyroxine 50 mcg.  Take the thyroid hormone every day, with water, at least 30 minutes before breakfast, separated by at least 4 hours from: - acid reflux medications - calcium - iron - multivitamins  Please come back for a follow-up appointment in 3 months.

## 2017-09-09 NOTE — Progress Notes (Signed)
Patient ID: [name] [name], female   DOB: [birth year], 84 y.o.   MRN: [id number]  HPI: [name] [name] is a 84 y.o.-year-old female, returning for f/u for DM2, dx in ~2006, insulin-dependent, uncontrolled, with complications (peripheral neuropathy, CAD, cerebrovascular ds - s/p stroke 12/2016, CKD). Last visit 3 months ago.  She is here with 1 of her sons who offers most of the history as patient is mostly nonverbal.  DM2: Last hemoglobin A1c was: Lab Results  Component Value Date   HGBA1C 10.2 06/03/2017   HGBA1C 11.3 (H) 03/04/2017   HGBA1C 12.0 (H) 10/03/2016   Pt was on a regimen of: - Glipizide 5 mg 2x a day before meals - Lantus 18 units in am added 04/2015 - in am >> no missed doses She was on Metformin ER 750 mg 2x a day >> stopped b/c diarrhea.  Now on: NovoLog 70/30: - 15 units before a meal without dessert - 18 units before a meal with dessert  Pt checks her sugars 2x a day: - am:  90-130 >> 92, but lately 180-190 >> 86-183, 247 - 2h after brunch:  120-150 >> n/c - before lunch: n/c - 2h after lunch: n/c >> 226 >> n/c - before dinner:160-200, lately: 150-170 >> n/c  - 2h after dinner:  150-190 >> >200-300s >> 101-380, 438 - bedtime: n/c   - nighttime: n/c >> 120-150 >> n/c Lowest sugar was 90 >> 86.  Unclear if she has hypoglycemia awareness.  Highest sugar was 300s >> 438.  Glucometer: AccuChek  Pt's meals are: - Breakfast: cold or hot cereal + Glucerna - Lunch: small sandwich - Dinner: vegetables + meat + bread + Glucerna - Snacks: 1/2 banana, grapes, candy, crackers  -+ CKD, last BUN/creatinine:  Lab Results  Component Value Date   BUN 30 (H) 10/18/2016   CREATININE 1.40 (H) 10/18/2016  On lisinopril. -+ HL; last set of lipids: Lab Results  Component Value Date   CHOL 135 10/03/2016   HDL 23.80 (L) 10/03/2016   LDLCALC 152 (H) 01/11/2016   LDLDIRECT 49.0 10/03/2016   TRIG 326.0 (H) 10/03/2016   CHOLHDL 6 10/03/2016  On Crestor 20.  Previously on fish  oil. - last eye exam was in 05/2017: No DR.  + Cataract OU >> implants. Has bifocal glasses now. -+ Numbness and tingling in her feet.  She is on Hydrocodone for neuropathic pain.  This is the only thing that helps.  She tried Lyrica and Neurontin but did not help.  She also has hypothyroidism:  Reviewed TSH levels -she was previously noncompliant with levothyroxine and the dose kept being increased.  At last visit, her TFTs were finally normal: Lab Results  Component Value Date   TSH 0.91 06/03/2017   TSH <0.01 (L) 03/04/2017   TSH <0.01 (L) 12/23/2016   TSH <0.01 (L) 11/14/2016   TSH <0.01 (L) 10/03/2016   TSH 0.23 (L) 05/29/2016   FREET4 0.78 06/03/2017   FREET4 1.28 03/04/2017   FREET4 1.42 12/23/2016   FREET4 1.53 11/14/2016   FREET4 0.95 10/06/2015   FREET4 1.04 06/27/2015   Pt is on levothyroxine 50 mcg daily (decreased dose 02/2017), taken: - in am - fasting - at least 30 min from b'fast - no Ca, Fe - + MVI, PPIs later in the day - not on Biotin   ROS: Constitutional: + weight gain/no weight loss, no fatigue, no subjective hyperthermia, no subjective hypothermia Eyes: no blurry vision, no xerophthalmia ENT: no sore throat, no nodules palpated  in throat, no dysphagia, no odynophagia, no hoarseness Cardiovascular: no CP/no SOB/no palpitations/no leg swelling Respiratory: no cough/no SOB/no wheezing Gastrointestinal: no N/no V/no D/no C/no acid reflux Musculoskeletal: no muscle aches/no joint aches Skin: no rashes, no hair loss Neurological: no tremors/+ numbness/+ tingling/no dizziness  I reviewed pt's medications, allergies, PMH, social hx, family hx, and changes were documented in the history of present illness. Otherwise, unchanged from my initial visit note.  Past Medical History:  Diagnosis Date  . Chronic combined systolic and diastolic CHF, NYHA class 3 (HCC)    a. 03/2013 Echo: EF 25-30%, diff HK, sev antsept HK, mild MR.  . Congestive dilated  cardiomyopathy (HCC)    a. 03/2013 Echo: EF 25-30%;  b. 09/2013 s/p SJM 3242 CRT-P.  Marland Kitchen Coronary artery disease    a. 08/2012 Cath: LM nl, LAD 39m, LCX nondom, mild-mod nonobs dzs mid, OM1 ok, OM2 90/90p, RCA 40, EF 25-30%-->Med Rx.  . GERD (gastroesophageal reflux disease)   . High cholesterol   . Hypertension   . Hypothyroidism   . Left bundle branch block   . Osteoarthritis   . Stroke (HCC)   . Type II diabetes mellitus (HCC)    Past Surgical History:  Procedure Laterality Date  . ABDOMINAL HYSTERECTOMY  1980  . BI-VENTRICULAR PACEMAKER INSERTION N/A 10/01/2013   Procedure: BI-VENTRICULAR PACEMAKER INSERTION (CRT-P);  Surgeon: Duke Salvia, MD;  Location: [facility] CATH LAB;  Service: Cardiovascular;  Laterality: N/A;  . BI-VENTRICULAR PACEMAKER INSERTION (CRT-P)     a. 09/2013 s/p SJM 3242 CRT-P.  . Bilateral cateract surgery    . BMD  2006  . IR GENERIC HISTORICAL  10/18/2016   IR ANGIO INTRA EXTRACRAN SEL COM CAROTID INNOMINATE BILAT MOD SED 10/18/2016 Julieanne Cotton, MD [facility]-INTERV RAD  . IR GENERIC HISTORICAL  10/18/2016   IR ANGIO VERTEBRAL SEL SUBCLAVIAN INNOMINATE BILAT MOD SED 10/18/2016 Julieanne Cotton, MD [facility]INTER[facility]  . ORIF TIBIA PLATEAU Right 12/04/2013   Procedure: OPEN REDUCTION INTERNAL FIXATION (ORIF) TIBIAL PLATEAU;  Surgeon: Verlee Rossetti, MD;  Location: [facility] ORS;  Service: Orthopedics;  Laterality: Right;  . PERIPHERAL VASCULAR CATHETERIZATION N/A 06/17/2016   Procedure: Lower Extremity Angiography;  Surgeon: Runell Gess, MD;  Location: [facility] INVASIVE CV LAB;  Service: Cardiovascular;  Laterality: N/A;  . PERIPHERAL VASCULAR CATHETERIZATION Left 06/17/2016   Procedure: Peripheral Vascular Atherectomy;  Surgeon: Runell Gess, MD;  Location: [facility] INVASIVE CV LAB;  Service: Cardiovascular;  Laterality: Left;  Popliteal   . RIGHT HEART CATHETERIZATION N/A 09/08/2012   Procedure: RIGHT HEART CATH;  Surgeon: Dolores Patty, MD;  Location: [facility] CATH LAB;  Service: Cardiovascular;   Laterality: N/A;   Social History   Socioeconomic History  . Marital status: Divorced    Spouse name: Not on file  . Number of children: 4  . Years of education: Not on file  . Highest education level: Not on file  Social Needs  . Financial resource strain: Not on file  . Food insecurity - worry: Not on file  . Food insecurity - inability: Not on file  . Transportation needs - medical: Not on file  . Transportation needs - non-medical: Not on file  Occupational History  . Occupation: Retired Engineer, civil (consulting)  Tobacco Use  . Smoking status: Former Games developer  . Smokeless tobacco: Former Engineer, water and Sexual Activity  . Alcohol use: No  . Drug use: No  . Sexual activity: Not Currently  Other Topics Concern  . Not on file  Social History Narrative   Widowed.  Lives with her son.  Normally able to ambulate without assistance.  Quit smoking as a teenager.              Current Outpatient Medications on File Prior to Visit  Medication Sig Dispense Refill  . aspirin EC 325 MG tablet Take 325 mg by mouth daily.    . carvedilol (COREG) 12.5 MG tablet Take 6.25 mg by mouth 2 (two) times daily.    . clopidogrel (PLAVIX) 75 MG tablet TAKE ONE TABLET BY MOUTH ONCE DAILY 90 tablet 2  . co-enzyme Q-10 30 MG capsule Take 30 mg by mouth daily.    Marland Kitchen FREESTYLE LITE test strip USE ONE STRIP TO CHECK GLUCOSE TWICE DAILY 200 each 3  . furosemide (LASIX) 20 MG tablet Take 20 mg by mouth daily as needed for fluid.     . hydrOXYzine (ATARAX/VISTARIL) 25 MG tablet Take 25 mg by mouth 3 (three) times daily as needed for itching.    Marland Kitchen ibuprofen (ADVIL,MOTRIN) 200 MG tablet Take 400 mg by mouth daily as needed for headache or moderate pain.    Marland Kitchen insulin aspart protamine - aspart (NOVOLOG MIX 70/30 FLEXPEN) (70-30) 100 UNIT/ML FlexPen Inject 0.12-0.14 mLs (12-14 Units total) into the skin 2 (two) times daily before a meal. 15 mL 5  . levothyroxine (SYNTHROID, LEVOTHROID) 50 MCG tablet TAKE 1 TABLET BY MOUTH  ONCE DAILY 90 tablet 1  . lisinopril (PRINIVIL,ZESTRIL) 5 MG tablet Take 1 tablet (5 mg total) by mouth 2 (two) times daily. 180 tablet 3  . Omega-3 Fatty Acids (EQL OMEGA 3 FISH OIL) 1200 MG CAPS Take 1,200 mg by mouth daily.    . pantoprazole (PROTONIX) 40 MG tablet TAKE ONE TABLET BY MOUTH ONCE DAILY 90 tablet 2  . rosuvastatin (CRESTOR) 20 MG tablet TAKE ONE TABLET BY MOUTH ONCE DAILY 90 tablet 2  . Vitamin D, Ergocalciferol, (DRISDOL) 50000 units CAPS capsule Take 1 capsule by mouth every 7 (seven) days. (MONDAYS)     No current facility-administered medications on file prior to visit.    Allergies  Allergen Reactions  . Statins Other (See Comments)    Leg cramping  . Metformin And Related     Pt stopped it due to diarrhea  . Vytorin [Ezetimibe-Simvastatin] Other (See Comments)    Leg cramps   Family History  Problem Relation Age of Onset  . Diabetes Mother   . Diabetes Other   . Hypertension Other   . Uterine cancer Other   . Heart attack Brother    PE: BP 118/60   Pulse 63   Ht 5\' 5"  (1.651 m)   Wt 143 lb 6.4 oz (65 kg)   SpO2 96%   BMI 23.86 kg/m  Body mass index is 23.86 kg/m. Wt Readings from Last 3 Encounters:  09/09/17 143 lb 6.4 oz (65 kg)  08/28/17 130 lb (59 kg)  06/03/17 141 lb (64 kg)   Constitutional: Normal weight, in NAD Eyes: PERRLA, EOMI, no exophthalmos ENT: moist mucous membranes, no thyromegaly, no cervical lymphadenopathy Cardiovascular: RRR, No MRG Respiratory: CTA B Gastrointestinal: abdomen soft, NT, ND, BS+ Musculoskeletal: no deformities, strength intact in all 4 Skin: moist, warm, no rashes Neurological: + Mild tremor with outstretched hands, DTR normal in all 4   ASSESSMENT: 1. DM2, insulin-dependent, uncontrolled, with complications - Peripheral neuropathy - CAD - cerebrovascular ds - s/p stroke 12/2016  2. Hypothyroidism   PLAN:  1. Patient with long-standing, very  uncontrolled diabetes, on premixed insulin regimen, to  improve compliance.  At last visit, we increased her 70/30 insulin doses but the sugars were still high, with an HbA1c of 10.2% (only slightly better).  Her son was telling me that she was still sneaking dessert/snacks between meals.  We discussed to add the desserts only after a meal, not between meals.  I also advised her to inject more insulin before a meal if followed by dessert. - she gets all insulin doses (15 units) - her children give her the insulin before B and D - son tells me she grazes throughout the day >> will increase both insulin doses to 18 units - discussed about using a lower dose, 15 units if more active - I suggested to:  Patient Instructions  Please increase NovoLog 70/30: - 18 units before b'fast - 18 units before dinner  Please continue Levothyroxine 50 mcg.  Take the thyroid hormone every day, with water, at least 30 minutes before breakfast, separated by at least 4 hours from: - acid reflux medications - calcium - iron - multivitamins  Please come back for a follow-up appointment in 3 months.  - today, HbA1c is 9.4% (better) - continue checking sugars at different times of the day - check 2-3x a day, rotating checks - advised for yearly eye exams >> she is UTD - Return to clinic in 3 mo with sugar log      2. Hypothyroidism - She has a history of noncompliance with her levothyroxine dose - latest thyroid labs reviewed with pt >> normal at last office visit - she continues on LT4 50 mcg daily - pt feels good on this dose. - we discussed about taking the thyroid hormone every day, with water, >30 minutes before breakfast, separated by >4 hours from acid reflux medications, calcium, iron, multivitamins. Pt. is taking it correctly  Carlus Pavlov, MD PhD [facility] Endocrinology

## 2017-09-30 ENCOUNTER — Other Ambulatory Visit: Payer: Self-pay | Admitting: *Deleted

## 2017-09-30 NOTE — Patient Outreach (Signed)
[facility]) Care Management   09/30/2017  Darlette Rocio 1934/07/19 [id number]  Jerzi Fritzinger is an 84 y.o. female  Subjective:  DIABETES: Pt reports she continues to completed BS 2-3 times a day and admits she snacks in between meals. Son states "more snacking then she needs".  Son reports her average readings between 79-140 with a recent office visit to her providers with a new A1C at 9.4 from 10.2. Pt states she will continue to work on improving her numbers. Denies any signs or symptom of abnormal readings. EXERCISES: Pt states she has been walker more in the home due to the weather "to cold" outside but will increase her mobility over the next month with more activities outside. Pt feels she can continue to improve this goal and wishes to continue to work on this over the next month.                                 Objective:   Review of Systems  Constitutional: Negative.   HENT: Negative.   Eyes: Negative.   Respiratory: Negative.   Cardiovascular: Negative.   Gastrointestinal: Negative.   Genitourinary: Negative.   Musculoskeletal: Negative.   Skin: Negative.   Neurological: Negative.   Endo/Heme/Allergies: Negative.   Psychiatric/Behavioral: Negative.     Physical Exam  Constitutional: She is oriented to person, place, and time. She appears well-developed and well-nourished.  HENT:  Right Ear: External ear normal.  Left Ear: External ear normal.  Eyes: EOM are normal.  Neck: Normal range of motion.  Cardiovascular: Normal heart sounds.  Respiratory: Effort normal and breath sounds normal.  GI: Soft. Bowel sounds are normal.  Musculoskeletal: Normal range of motion.  Neurological: She is alert and oriented to person, place, and time.  Skin: Skin is warm and dry.  Psychiatric: She has a normal mood and affect. Her behavior is normal. Judgment and thought content normal.    Encounter Medications:   Outpatient Encounter Medications as of 09/30/2017   Medication Sig  . aspirin EC 325 MG tablet Take 325 mg by mouth daily.  . carvedilol (COREG) 12.5 MG tablet Take 6.25 mg by mouth 2 (two) times daily.  . clopidogrel (PLAVIX) 75 MG tablet TAKE ONE TABLET BY MOUTH ONCE DAILY  . FREESTYLE LITE test strip USE ONE STRIP TO CHECK GLUCOSE TWICE DAILY  . furosemide (LASIX) 20 MG tablet Take 20 mg by mouth daily as needed for fluid.   Marland Kitchen glipiZIDE (GLUCOTROL) 10 MG tablet   . hydrOXYzine (ATARAX/VISTARIL) 25 MG tablet Take 25 mg by mouth 3 (three) times daily as needed for itching.  Marland Kitchen ibuprofen (ADVIL,MOTRIN) 200 MG tablet Take 400 mg by mouth daily as needed for headache or moderate pain.  Marland Kitchen insulin aspart protamine - aspart (NOVOLOG MIX 70/30 FLEXPEN) (70-30) 100 UNIT/ML FlexPen Inject 0.18 mLs (18 Units total) into the skin 2 (two) times daily before a meal.  . levothyroxine (SYNTHROID, LEVOTHROID) 50 MCG tablet TAKE 1 TABLET BY MOUTH ONCE DAILY  . lisinopril (PRINIVIL,ZESTRIL) 5 MG tablet Take 1 tablet (5 mg total) by mouth 2 (two) times daily.  . pantoprazole (PROTONIX) 40 MG tablet TAKE ONE TABLET BY MOUTH ONCE DAILY  . rosuvastatin (CRESTOR) 20 MG tablet TAKE ONE TABLET BY MOUTH ONCE DAILY  . Vitamin D, Ergocalciferol, (DRISDOL) 50000 units CAPS capsule Take 1 capsule by mouth every 7 (seven) days. (MONDAYS)  . co-enzyme Q-10 30 MG capsule  Take 30 mg by mouth daily.  . Omega-3 Fatty Acids (EQL OMEGA 3 FISH OIL) 1200 MG CAPS Take 1,200 mg by mouth daily.   No facility-administered encounter medications on file as of 09/30/2017.     Functional Status:   In your present state of health, do you have any difficulty performing the following activities: 08/28/2017 11/06/2016  Hearing? - Y  Comment - -  Vision? N Y  Comment - left peripheral vision is gone  Difficulty concentrating or making decisions? N Y  Comment - -  Walking or climbing stairs? Y Y  Comment - -  Dressing or bathing? N Y  Comment - -  Doing errands, shopping? Y Y  Comment -  -  Preparing Food and eating ? N Y  Comment - -  Using the Toilet? N N  In the past six months, have you accidently leaked urine? Y Y  Comment - -  Do you have problems with loss of bowel control? N N  Managing your Medications? Y Y  Comment - -  Managing your Finances? Y Y  Comment - -  Housekeeping or managing your Housekeeping? N Y  Comment - -  Some recent data might be hidden    Fall/Depression Screening:    Fall Risk  08/28/2017 02/25/2017 11/06/2016  Falls in the past year? No No Yes  Comment - - -  Number falls in past yr: - - 2 or more  Comment - - -  Injury with Fall? - - Yes  Risk Factor Category  - - High Fall Risk  Risk for fall due to : - - Impaired mobility;Impaired balance/gait;History of fall(s)  Follow up - - Falls evaluation completed;Education provided;Falls prevention discussed   PHQ 2/9 Scores 08/28/2017 11/06/2016 11/01/2016  PHQ - 2 Score 0 0 2  PHQ- 9 Score - - 4   BP 126/80 (BP Location: Left Arm, Patient Position: Sitting, Cuff Size: Normal)   Pulse 64   Resp 20   Wt 143 lb (64.9 kg)   SpO2 98%   BMI 23.80 kg/m  Assessment:   Ongoing case management related to diabetes Follow up on mobility with increase exercises  Plan:  Will continue to discuss pt's BS and changes she had made to improve her CBG. Will continue to educate on healthy eating habits and the importance of why she should improve her overall A1C. Will praise pt for her improvement on hr A1C and continue to encourage pt to improve this reading and why. Will verify pt has been adherent with her CBG monitoring and continue to encourage pt on the importance of daily monitoring. Will provide a [facility] calendar as a tool for documenting all CBG and keeping track of her activities.  Will verify pt has increased her activity with more mobility both inside and outside the home if weather permits. Will continue to encouraged adherence with this mobility and extend this goal to allow adherence.  Will  discussed plan of care, goals and interventions discussed and adjusted accordingly to accommodate pt's improvement and motivate pt toward meeting her goals.  Will rescheduled the next home visit for next month according to pt's schedule.   Elliot Cousin, RN Care Management Coordinator [facility] Main Office [phone]

## 2017-10-08 ENCOUNTER — Other Ambulatory Visit: Payer: Self-pay | Admitting: Internal Medicine

## 2017-10-23 ENCOUNTER — Other Ambulatory Visit: Payer: Self-pay | Admitting: Internal Medicine

## 2017-10-30 ENCOUNTER — Other Ambulatory Visit: Payer: Self-pay | Admitting: *Deleted

## 2017-10-30 NOTE — Patient Outreach (Signed)
[facility]) Care Management   10/30/2017  [name] [name] [birth year] [id number]  [name] [name] is an 84 y.o. female  Subjective:  Diabetes: Pt reports she is trying to eating health food items but continues to nibble on her snacks in moderation. Pt reports she continues to maintain her weight with good tolerance to her meals. Reports her CBG this morning at 151 with no encountered symptoms over the last month since RN last home visit.  States she believes the issues with her elevated readings related to her consumption of juices. States she my have her juices to much throughout the day. Pt reports taking all her prescribed medications and attending all medical appointments with no delays.   Objective:   Review of Systems  All other systems reviewed and are negative.   Physical Exam  Constitutional: She is oriented to person, place, and time. She appears well-developed and well-nourished.  HENT:  Right Ear: External ear normal.  Left Ear: External ear normal.  Eyes: EOM are normal.  Neck: Normal range of motion.  Cardiovascular: Normal heart sounds.  Respiratory: Effort normal and breath sounds normal.  GI: Soft. Bowel sounds are normal.  Musculoskeletal: Normal range of motion.  Neurological: She is alert and oriented to person, place, and time.  Skin: Skin is warm and dry.  Psychiatric: She has a normal mood and affect. Her behavior is normal. Judgment and thought content normal.    Encounter Medications:   Outpatient Encounter Medications as of 10/30/2017  Medication Sig  . aspirin EC 325 MG tablet Take 325 mg by mouth daily.  . carvedilol (COREG) 12.5 MG tablet Take 6.25 mg by mouth 2 (two) times daily.  . clopidogrel (PLAVIX) 75 MG tablet TAKE 1 TABLET BY MOUTH ONCE DAILY  . co-enzyme Q-10 30 MG capsule Take 30 mg by mouth daily.  Marland Kitchen FREESTYLE LITE test strip USE ONE STRIP TO CHECK GLUCOSE TWICE DAILY  . furosemide (LASIX) 20 MG tablet Take 20 mg by mouth  daily as needed for fluid.   Marland Kitchen glipiZIDE (GLUCOTROL) 10 MG tablet   . hydrOXYzine (ATARAX/VISTARIL) 25 MG tablet Take 25 mg by mouth 3 (three) times daily as needed for itching.  Marland Kitchen ibuprofen (ADVIL,MOTRIN) 200 MG tablet Take 400 mg by mouth daily as needed for headache or moderate pain.  Marland Kitchen insulin aspart protamine - aspart (NOVOLOG MIX 70/30 FLEXPEN) (70-30) 100 UNIT/ML FlexPen Inject 0.18 mLs (18 Units total) into the skin 2 (two) times daily before a meal.  . levothyroxine (SYNTHROID, LEVOTHROID) 50 MCG tablet TAKE 1 TABLET BY MOUTH ONCE DAILY  . lisinopril (PRINIVIL,ZESTRIL) 5 MG tablet Take 1 tablet (5 mg total) by mouth 2 (two) times daily.  . Omega-3 Fatty Acids (EQL OMEGA 3 FISH OIL) 1200 MG CAPS Take 1,200 mg by mouth daily.  . pantoprazole (PROTONIX) 40 MG tablet TAKE ONE TABLET BY MOUTH ONCE DAILY  . rosuvastatin (CRESTOR) 20 MG tablet TAKE 1 TABLET BY MOUTH ONCE DAILY  . Vitamin D, Ergocalciferol, (DRISDOL) 50000 units CAPS capsule Take 1 capsule by mouth every 7 (seven) days. (MONDAYS)   No facility-administered encounter medications on file as of 10/30/2017.     Functional Status:   In your present state of health, do you have any difficulty performing the following activities: 08/28/2017 11/06/2016  Hearing? - Y  Comment - -  Vision? N Y  Comment - left peripheral vision is gone  Difficulty concentrating or making decisions? N Y  Comment - -  Walking or  climbing stairs? Y Y  Comment - -  Dressing or bathing? N Y  Comment - -  Doing errands, shopping? Y Y  Comment - -  Preparing Food and eating ? N Y  Comment - -  Using the Toilet? N N  In the past six months, have you accidently leaked urine? Y Y  Comment - -  Do you have problems with loss of bowel control? N N  Managing your Medications? Y Y  Comment - -  Managing your Finances? Y Y  Comment - -  Housekeeping or managing your Housekeeping? N Y  Comment - -  Some recent data might be hidden    Fall/Depression  Screening:    Fall Risk  08/28/2017 02/25/2017 11/06/2016  Falls in the past year? No No Yes  Comment - - -  Number falls in past yr: - - 2 or more  Comment - - -  Injury with Fall? - - Yes  Risk Factor Category  - - High Fall Risk  Risk for fall due to : - - Impaired mobility;Impaired balance/gait;History of fall(s)  Follow up - - Falls evaluation completed;Education provided;Falls prevention discussed   PHQ 2/9 Scores 08/28/2017 11/06/2016 11/01/2016  PHQ - 2 Score 0 0 2  PHQ- 9 Score - - 4    BP 130/70 (BP Location: Left Arm, Patient Position: Sitting, Cuff Size: Normal)   Pulse 66   Resp 20   SpO2 94%   Assessment:   Diabetes related to ongoing case management   Plan:  Will verify pt's ongoing management of care related to managing her diabetes with increased exercises however pt reports delayed to weather but promises to start walking more outside with her son. Will verify pt continues to eat a healthier diet however continues to unhealthy snacks. Will discuss healthier food items for her snacks. Will maintain pt continues to manage her weight and monitor her CBG when consuming unhealthy food items. Will reiterated on the risk of developing other medical condition based upon her elevated A1C currently at 10.4 however pending repeat labs. Will stress the importance of reducing her overall readings by having her juices to a limited amount throughout the day (cutting back) however encouraged H2O also throughout the day (pt receptive). Will also reiterate on the importance of increasing her activities with weekly walks to improving her diabetes. Will revisit and discuss pt's plan of care concerning her diabetes and adjust interventions discussed with extend goals to allow adherence with managing this medical condition for long term management. Will view glucometer readings and note 7 day-202,14 day-222,30 day-232 reads.  Elliot Cousin, RN Care Management Coordinator [facility] Main Office [phone]

## 2017-11-10 ENCOUNTER — Ambulatory Visit (INDEPENDENT_AMBULATORY_CARE_PROVIDER_SITE_OTHER): Payer: Medicare Other | Admitting: *Deleted

## 2017-11-10 DIAGNOSIS — I428 Other cardiomyopathies: Secondary | ICD-10-CM

## 2017-11-10 NOTE — Progress Notes (Signed)
Remote pacemaker transmission.   

## 2017-11-11 ENCOUNTER — Encounter: Payer: Self-pay | Admitting: Cardiology

## 2017-11-17 ENCOUNTER — Other Ambulatory Visit: Payer: Medicare Other

## 2017-11-25 LAB — CUP PACEART REMOTE DEVICE CHECK
Battery Remaining Longevity: 88 mo
Battery Voltage: 2.96 V
Brady Statistic AP VS Percent: 1 %
Brady Statistic AS VP Percent: 64 %
Date Time Interrogation Session: 20190401074041
Implantable Lead Implant Date: 20150220
Implantable Lead Implant Date: 20150220
Implantable Lead Location: 753859
Implantable Lead Location: 753860
Implantable Pulse Generator Implant Date: 20150220
Lead Channel Impedance Value: 1000 Ohm
Lead Channel Impedance Value: 400 Ohm
Lead Channel Impedance Value: 410 Ohm
Lead Channel Pacing Threshold Amplitude: 0.625 V
Lead Channel Pacing Threshold Pulse Width: 0.4 ms
Lead Channel Pacing Threshold Pulse Width: 1 ms
Lead Channel Sensing Intrinsic Amplitude: 10.3 mV
Lead Channel Sensing Intrinsic Amplitude: 2.2 mV
Lead Channel Setting Pacing Amplitude: 1.625
Lead Channel Setting Pacing Amplitude: 2.25 V
Lead Channel Setting Pacing Pulse Width: 0.4 ms
Lead Channel Setting Pacing Pulse Width: 1 ms
MDC IDC LEAD IMPLANT DT: 20150220
MDC IDC LEAD LOCATION: 753858
MDC IDC MSMT BATTERY REMAINING PERCENTAGE: 95.5 %
MDC IDC MSMT LEADCHNL LV PACING THRESHOLD AMPLITUDE: 1.25 V
MDC IDC MSMT LEADCHNL RA PACING THRESHOLD PULSEWIDTH: 0.4 ms
MDC IDC MSMT LEADCHNL RV PACING THRESHOLD AMPLITUDE: 1.25 V
MDC IDC PG SERIAL: 7578583
MDC IDC SET LEADCHNL LV PACING AMPLITUDE: 2.25 V
MDC IDC SET LEADCHNL RV SENSING SENSITIVITY: 2 mV
MDC IDC STAT BRADY AP VP PERCENT: 36 %
MDC IDC STAT BRADY AS VS PERCENT: 1 %
MDC IDC STAT BRADY RA PERCENT PACED: 36 %

## 2017-12-04 ENCOUNTER — Other Ambulatory Visit: Payer: Self-pay | Admitting: *Deleted

## 2017-12-04 ENCOUNTER — Encounter: Payer: Self-pay | Admitting: *Deleted

## 2017-12-04 NOTE — Patient Outreach (Signed)
[facility]) Care Management   12/04/2017  Mackayla Mullins 06-28-1934 [id number]  Ernestyne Caldwell is an 84 y.o. female  Subjective:  Diabetes: Pt reports she is trying very hard to regulate her dietary habits to reduce her blood sugars. Pt states she is exercising more in the home due to the changing weather. Pt states she goes up and down the stairs for her exercises and walks more in the home but will get outside with the weather getting warmer. Pt reports an upcoming appointment next week with her endocrinologist and will have her A1C checked and updated. Pt states the changes are not bad but she can tell the difference with her eating habits limited her sweets. Pt will continue this regimen to manage her diabetes.  Pt open to a possible graduation from the [facility] program if her A1C is improved by the next home visit in May.  Objective:   Review of Systems  Constitutional: Negative.   HENT: Negative.   Eyes: Negative.   Respiratory: Negative.   Cardiovascular: Negative.   Gastrointestinal: Negative.   Genitourinary: Negative.   Musculoskeletal: Negative.   Skin: Negative.   Neurological: Negative.   Endo/Heme/Allergies: Negative.   Psychiatric/Behavioral: Negative.     Physical Exam  Constitutional: She is oriented to person, place, and time. She appears well-developed and well-nourished.  HENT:  Right Ear: External ear normal.  Left Ear: External ear normal.  Eyes: EOM are normal.  Neck: Normal range of motion.  Respiratory: Effort normal and breath sounds normal.  GI: Soft. Bowel sounds are normal.  Musculoskeletal: Normal range of motion.  Neurological: She is alert and oriented to person, place, and time.  Skin: Skin is warm and dry.  Psychiatric: She has a normal mood and affect. Her behavior is normal. Judgment and thought content normal.    Encounter Medications:   Outpatient Encounter Medications as of 12/04/2017  Medication Sig  . aspirin EC 325 MG  tablet Take 325 mg by mouth daily.  . carvedilol (COREG) 12.5 MG tablet Take 6.25 mg by mouth 2 (two) times daily.  . clopidogrel (PLAVIX) 75 MG tablet TAKE 1 TABLET BY MOUTH ONCE DAILY  . co-enzyme Q-10 30 MG capsule Take 30 mg by mouth daily.  Marland Kitchen FREESTYLE LITE test strip USE ONE STRIP TO CHECK GLUCOSE TWICE DAILY  . furosemide (LASIX) 20 MG tablet Take 20 mg by mouth daily as needed for fluid.   Marland Kitchen glipiZIDE (GLUCOTROL) 10 MG tablet   . hydrOXYzine (ATARAX/VISTARIL) 25 MG tablet Take 25 mg by mouth 3 (three) times daily as needed for itching.  Marland Kitchen ibuprofen (ADVIL,MOTRIN) 200 MG tablet Take 400 mg by mouth daily as needed for headache or moderate pain.  Marland Kitchen insulin aspart protamine - aspart (NOVOLOG MIX 70/30 FLEXPEN) (70-30) 100 UNIT/ML FlexPen Inject 0.18 mLs (18 Units total) into the skin 2 (two) times daily before a meal.  . levothyroxine (SYNTHROID, LEVOTHROID) 50 MCG tablet TAKE 1 TABLET BY MOUTH ONCE DAILY  . lisinopril (PRINIVIL,ZESTRIL) 5 MG tablet Take 1 tablet (5 mg total) by mouth 2 (two) times daily.  . Omega-3 Fatty Acids (EQL OMEGA 3 FISH OIL) 1200 MG CAPS Take 1,200 mg by mouth daily.  . pantoprazole (PROTONIX) 40 MG tablet TAKE ONE TABLET BY MOUTH ONCE DAILY  . rosuvastatin (CRESTOR) 20 MG tablet TAKE 1 TABLET BY MOUTH ONCE DAILY  . Vitamin D, Ergocalciferol, (DRISDOL) 50000 units CAPS capsule Take 1 capsule by mouth every 7 (seven) days. (MONDAYS)   No  facility-administered encounter medications on file as of 12/04/2017.     Functional Status:   In your present state of health, do you have any difficulty performing the following activities: 08/28/2017  Vision? N  Difficulty concentrating or making decisions? N  Walking or climbing stairs? Y  Dressing or bathing? N  Doing errands, shopping? Y  Preparing Food and eating ? N  Using the Toilet? N  In the past six months, have you accidently leaked urine? Y  Do you have problems with loss of bowel control? N  Managing your  Medications? Y  Managing your Finances? Y  Housekeeping or managing your Housekeeping? N  Some recent data might be hidden    Fall/Depression Screening:    Fall Risk  08/28/2017 02/25/2017 11/06/2016  Falls in the past year? No No Yes  Comment - - -  Number falls in past yr: - - 2 or more  Comment - - -  Injury with Fall? - - Yes  Risk Factor Category  - - High Fall Risk  Risk for fall due to : - - Impaired mobility;Impaired balance/gait;History of fall(s)  Follow up - - Falls evaluation completed;Education provided;Falls prevention discussed   PHQ 2/9 Scores 08/28/2017 11/06/2016 11/01/2016  PHQ - 2 Score 0 0 2  PHQ- 9 Score - - 4   BP 120/72 (BP Location: Left Arm, Patient Position: Sitting, Cuff Size: Normal)   Pulse 68   Resp 20   Ht 1.651 m (5\' 5" )   Wt 143 lb (64.9 kg)   SpO2 98%   BMI 23.80 kg/m  Assessment:   Ongoing case management related to Diabetes  Plan:  Will review pt's progress with her daily CBGs and documented her 02/22/29 averages which had decreased. Will verify pt's morning read of 152 and continue to encouraged adherence. Will reiterated on the importance of regulating her diabetes due to the risk involved with other possible infections and/or illness. Will discussed dietary habits and increased activities to again reduce her overall A1C and glucose readings. Will discuss once again what an A1C is and why it is importance to reduce this readings in managing her diabetes. Will discussed the plan of care, goals and interventions adjusted according to pt's progress. Will re-evaluate next month prior to possible graduating from the [facility] program and services. Will send update report to pt's primary provider concerning ongoing barriers.  Elliot Cousin, RN Care Management Coordinator [facility] Main Office [phone]

## 2017-12-08 ENCOUNTER — Ambulatory Visit (INDEPENDENT_AMBULATORY_CARE_PROVIDER_SITE_OTHER): Payer: Medicare Other | Admitting: Internal Medicine

## 2017-12-08 ENCOUNTER — Encounter: Payer: Self-pay | Admitting: Internal Medicine

## 2017-12-08 VITALS — BP 122/80 | HR 64 | Ht 65.0 in | Wt 143.8 lb

## 2017-12-08 DIAGNOSIS — E1140 Type 2 diabetes mellitus with diabetic neuropathy, unspecified: Secondary | ICD-10-CM

## 2017-12-08 DIAGNOSIS — Z794 Long term (current) use of insulin: Secondary | ICD-10-CM

## 2017-12-08 DIAGNOSIS — E039 Hypothyroidism, unspecified: Secondary | ICD-10-CM

## 2017-12-08 LAB — POCT GLYCOSYLATED HEMOGLOBIN (HGB A1C): HEMOGLOBIN A1C: 8.8

## 2017-12-08 NOTE — Progress Notes (Signed)
Patient ID: [name] [name], female   DOB: July 17, 1934, 84 y.o.   MRN: [id number]  HPI: [name] [name] is a 84 y.o.-year-old female, returning for f/u for DM2, dx in ~2006, insulin-dependent, uncontrolled, with complications (peripheral neuropathy, CAD, cerebrovascular ds - s/p stroke 12/2016, CKD). Last visit 3 months ago.  She is here with 1 of her sons who offers most of the history as patient is mostly nonverbal.    DM2: Last hemoglobin A1c was: Lab Results  Component Value Date   HGBA1C 9.4 09/09/2017   HGBA1C 10.2 06/03/2017   HGBA1C 11.3 (H) 03/04/2017   Pt was on a regimen of: - Glipizide 5 mg 2x a day before meals - Lantus 18 units in am added 04/2015 - in am >> no missed doses She was on Metformin ER 750 mg 2x a day >> stopped b/c diarrhea.  Now on: NovoLog 70/30: 18 units 2x a day - 2h after she eats!  Pt checks her sugars twice a day: - am:92, but lately 180-190 >> 86-183, 247 >> 70, 112-185, 193 - 2h after brunch:  120-150 >> n/c >> 141-179, 273 - before lunch: n/c >> 186, 246 - 2h after lunch: n/c >> 226 >> n/c - before dinner:160-200, lately: 150-170 >> n/c  - 2h after dinner: 200-300s >> 101-380, 438 >> 85, 125, 154, 209-305, 413 - bedtime: n/c   - nighttime: n/c >> 120-150 >> n/c Lowest sugar was 90 >> 86 >> 70.  It is unclear at which level she has hypoglycemia awareness.  Highest sugar was 300s >> 438 >> 413.  Glucometer: AccuChek  Pt's meals are: - Breakfast: cold or hot cereal + Glucerna - Lunch: small sandwich - Dinner: vegetables + meat + bread + Glucerna - Snacks: 1/2 banana, grapes, candy, crackers  - + CKD, last BUN/creatinine:  Lab Results  Component Value Date   BUN 30 (H) 10/18/2016   CREATININE 1.40 (H) 10/18/2016  On lisinopril. -+ HL; last set of lipids: Lab Results  Component Value Date   CHOL 135 10/03/2016   HDL 23.80 (L) 10/03/2016   LDLCALC 152 (H) 01/11/2016   LDLDIRECT 49.0 10/03/2016   TRIG 326.0 (H) 10/03/2016   CHOLHDL 6  10/03/2016  On Crestor 20. - last eye exam was in 05/2017: No DR.  + Cataract OU >> implants. Has bifocal glasses now. -+ Numbness and tingling in her feet.  She is on hydrocodone for neuropathic pain.  This is the only thing that helps.  She tried Lyrica and Neurontin without success.  She also has hypothyroidism:  Previous noncompliance with levothyroxine, but at last check tests are finally normal: Lab Results  Component Value Date   TSH 0.91 06/03/2017   TSH <0.01 (L) 03/04/2017   TSH <0.01 (L) 12/23/2016   TSH <0.01 (L) 11/14/2016   TSH <0.01 (L) 10/03/2016   TSH 0.23 (L) 05/29/2016   FREET4 0.78 06/03/2017   FREET4 1.28 03/04/2017   FREET4 1.42 12/23/2016   FREET4 1.53 11/14/2016   FREET4 0.95 10/06/2015   FREET4 1.04 06/27/2015   Pt is on levothyroxine 50 mcg daily (decreased dose 02/2017), taken: - in am - fasting - at least 30 min from b'fast - no Ca, Fe - + MVI, PPIs, later in the day - not on Biotin  ROS: Constitutional: no weight gain/no weight loss, no fatigue, no subjective hyperthermia, no subjective hypothermia Eyes: no blurry vision, no xerophthalmia ENT: no sore throat, no nodules palpated in throat, no dysphagia, no odynophagia, no  hoarseness Cardiovascular: no CP/no SOB/no palpitations/no leg swelling Respiratory: no cough/no SOB/no wheezing Gastrointestinal: no N/no V/no D/no C/no acid reflux Musculoskeletal: no muscle aches/no joint aches Skin: no rashes, no hair loss Neurological: no tremors/+ numbness/+ tingling/no dizziness  I reviewed pt's medications, allergies, PMH, social hx, family hx, and changes were documented in the history of present illness. Otherwise, unchanged from my initial visit note.  Past Medical History:  Diagnosis Date  . Chronic combined systolic and diastolic CHF, NYHA class 3 (HCC)    a. 03/2013 Echo: EF 25-30%, diff HK, sev antsept HK, mild MR.  . Congestive dilated cardiomyopathy (HCC)    a. 03/2013 Echo: EF 25-30%;  b.  09/2013 s/p SJM 3242 CRT-P.  Marland Kitchen Coronary artery disease    a. 08/2012 Cath: LM nl, LAD 13m, LCX nondom, mild-mod nonobs dzs mid, OM1 ok, OM2 90/90p, RCA 40, EF 25-30%-->Med Rx.  . GERD (gastroesophageal reflux disease)   . High cholesterol   . Hypertension   . Hypothyroidism   . Left bundle branch block   . Osteoarthritis   . Stroke (HCC)   . Type II diabetes mellitus (HCC)    Past Surgical History:  Procedure Laterality Date  . ABDOMINAL HYSTERECTOMY  1980  . BI-VENTRICULAR PACEMAKER INSERTION N/A 10/01/2013   Procedure: BI-VENTRICULAR PACEMAKER INSERTION (CRT-P);  Surgeon: Duke Salvia, MD;  Location[facility] CATH LAB;  Service: Cardiovascular;  Laterality: N/A;  . BI-VENTRICULAR PACEMAKER INSERTION (CRT-P)     a. 09/2013 s/p SJM 3242 CRT-P.  . Bilateral cateract surgery    . BMD  2006  . IR GENERIC HISTORICAL  10/18/2016   IR ANGIO INTRA EXTRACRAN SEL COM CAROTID INNOMINATE BILAT MOD SED 10/18/2016 Julieanne Cotton, MD [facility]-INTERV RAD  . IR GENERIC HISTORICAL  10/18/2016   IR ANGIO VERTEBRAL SEL SUBCLAVIAN INNOMINATE BILAT MOD SED 10/18/2016 Julieanne Cotton, MD [facility]-INTER[facility] RAD  . ORIF TIBIA PLATEAU Right 12/04/2013   Procedure: OPEN REDUCTION INTERNAL FIXATION (ORIF) TIBIAL PLATEAU;  Surgeon: Verlee Rossetti, MD;  Location: [facility] ORS;  Service: Orthopedics;  Laterality: Right;  . PERIPHERAL VASCULAR CATHETERIZATION N/A 06/17/2016   Procedure: Lower Extremity Angiography;  Surgeon: Runell Gess, MD;  Location: [facility] INVASIVE CV LAB;  Service: Cardiovascular;  Laterality: N/A;  . PERIPHERAL VASCULAR CATHETERIZATION Left 06/17/2016   Procedure: Peripheral Vascular Atherectomy;  Surgeon: Runell Gess, MD;  Location: [facility] INVASIVE CV LAB;  Service: Cardiovascular;  Laterality: Left;  Popliteal   . RIGHT HEART CATHETERIZATION N/A 09/08/2012   Procedure: RIGHT HEART CATH;  Surgeon: Dolores Patty, MD;  Location: [facility] CATH LAB;  Service: Cardiovascular;  Laterality: N/A;   Social History   Socioeconomic  History  . Marital status: Divorced    Spouse name: Not on file  . Number of children: 4  . Years of education: Not on file  . Highest education level: Not on file  Occupational History  . Occupation: Retired Designer, television/film set  . Financial resource strain: Not on file  . Food insecurity:    Worry: Not on file    Inability: Not on file  . Transportation needs:    Medical: Not on file    Non-medical: Not on file  Tobacco Use  . Smoking status: Former Games developer  . Smokeless tobacco: Former Engineer, water and Sexual Activity  . Alcohol use: No  . Drug use: No  . Sexual activity: Not Currently  Lifestyle  . Physical activity:    Days per week: Not on file  Minutes per session: Not on file  . Stress: Not on file  Relationships  . Social connections:    Talks on phone: Not on file    Gets together: Not on file    Attends religious service: Not on file    Active member of club or organization: Not on file    Attends meetings of clubs or organizations: Not on file    Relationship status: Not on file  . Intimate partner violence:    Fear of current or ex partner: Not on file    Emotionally abused: Not on file    Physically abused: Not on file    Forced sexual activity: Not on file  Other Topics Concern  . Not on file  Social History Narrative   Widowed.  Lives with her son.  Normally able to ambulate without assistance.  Quit smoking as a teenager.              Current Outpatient Medications on File Prior to Visit  Medication Sig Dispense Refill  . aspirin EC 325 MG tablet Take 325 mg by mouth daily.    . carvedilol (COREG) 12.5 MG tablet Take 6.25 mg by mouth 2 (two) times daily.    . clopidogrel (PLAVIX) 75 MG tablet TAKE 1 TABLET BY MOUTH ONCE DAILY 90 tablet 0  . co-enzyme Q-10 30 MG capsule Take 30 mg by mouth daily.    Marland Kitchen FREESTYLE LITE test strip USE ONE STRIP TO CHECK GLUCOSE TWICE DAILY 200 each 3  . furosemide (LASIX) 20 MG tablet Take 20 mg by mouth daily as  needed for fluid.     Marland Kitchen glipiZIDE (GLUCOTROL) 10 MG tablet     . hydrOXYzine (ATARAX/VISTARIL) 25 MG tablet Take 25 mg by mouth 3 (three) times daily as needed for itching.    Marland Kitchen ibuprofen (ADVIL,MOTRIN) 200 MG tablet Take 400 mg by mouth daily as needed for headache or moderate pain.    Marland Kitchen insulin aspart protamine - aspart (NOVOLOG MIX 70/30 FLEXPEN) (70-30) 100 UNIT/ML FlexPen Inject 0.18 mLs (18 Units total) into the skin 2 (two) times daily before a meal. 15 mL 5  . levothyroxine (SYNTHROID, LEVOTHROID) 50 MCG tablet TAKE 1 TABLET BY MOUTH ONCE DAILY 90 tablet 1  . lisinopril (PRINIVIL,ZESTRIL) 5 MG tablet Take 1 tablet (5 mg total) by mouth 2 (two) times daily. 180 tablet 3  . Omega-3 Fatty Acids (EQL OMEGA 3 FISH OIL) 1200 MG CAPS Take 1,200 mg by mouth daily.    . pantoprazole (PROTONIX) 40 MG tablet TAKE ONE TABLET BY MOUTH ONCE DAILY 90 tablet 2  . rosuvastatin (CRESTOR) 20 MG tablet TAKE 1 TABLET BY MOUTH ONCE DAILY 90 tablet 0  . Vitamin D, Ergocalciferol, (DRISDOL) 50000 units CAPS capsule Take 1 capsule by mouth every 7 (seven) days. (MONDAYS)     No current facility-administered medications on file prior to visit.    Allergies  Allergen Reactions  . Statins Other (See Comments)    Leg cramping  . Metformin And Related     Pt stopped it due to diarrhea  . Vytorin [Ezetimibe-Simvastatin] Other (See Comments)    Leg cramps   Family History  Problem Relation Age of Onset  . Diabetes Mother   . Diabetes Other   . Hypertension Other   . Uterine cancer Other   . Heart attack Brother    PE: BP 122/80   Pulse 64   Ht 5\' 5"  (1.651 m)   Wt 143 lb  12.8 oz (65.2 kg)   SpO2 97%   BMI 23.93 kg/m  Body mass index is 23.93 kg/m. Wt Readings from Last 3 Encounters:  12/08/17 143 lb 12.8 oz (65.2 kg)  12/04/17 143 lb (64.9 kg)  09/30/17 143 lb (64.9 kg)   Constitutional: Normal weight, in NAD Eyes: PERRLA, EOMI, no exophthalmos ENT: moist mucous membranes, no thyromegaly, no  cervical lymphadenopathy Cardiovascular: RRR, No MRG Respiratory: CTA B Gastrointestinal: abdomen soft, NT, ND, BS+ Musculoskeletal: no deformities, strength intact in all 4 Skin: moist, warm, no rashes Neurological: + Mild tremor with outstretched hands, DTR normal in all 4   ASSESSMENT: 1. DM2, insulin-dependent, uncontrolled, with complications - Peripheral neuropathy - CAD - cerebrovascular ds - s/p stroke 12/2016  2. Hypothyroidism   PLAN:  1. Patient with long-standing, very uncontrolled diabetes, on premixed insulin regimen, to improve compliance.  Her son is giving her the injections, however, these are given 2 hours after meals!  Her sugars in the morning are at or close to goal but they increase significantly at bedtime, which is usually 2 hours after dinner, the time when she also gets her evening premixed insulin injection. - We discussed today that she absolutely needs to take the injections before meals, ideally 15 minutes before.   - We will not change the insulin doses today, but discussed about how to take it correctly - Overall, sugars are improved, despite her grazing throughout the day - I suggested to:  Patient Instructions  Please continue NovoLog 70/30, but start to take it 15 min before the meals! - 18 units before b'fast - 18 units before dinner  Please continue Levothyroxine 50 mcg.  Take the thyroid hormone every day, with water, at least 30 minutes before breakfast, separated by at least 4 hours from: - acid reflux medications - calcium - iron - multivitamins  Please come back for a follow-up appointment in 3 months.  - today, HbA1c is 8.8% (better) - continue checking sugars at different times of the day - check 2-3x a day, rotating checks - advised for yearly eye exams >> she is UTD - She is due for lipid panel and kidney profile but will have an annual appointment with PCP soon - Return to clinic in 3 mo with sugar log      2.  Hypothyroidism - She has a history of noncompliance with her levothyroxine dose - latest thyroid labs reviewed with pt >> finally normal in 05/2017 - she continues on LT4 50 mcg daily - pt feels good on this dose. - we discussed about taking the thyroid hormone every day, with water, >30 minutes before breakfast, separated by >4 hours from acid reflux medications, calcium, iron, multivitamins. Pt. is taking it correctly.  Carlus Pavlov, MD PhD [facility] Endocrinology

## 2017-12-08 NOTE — Patient Instructions (Addendum)
Please continue NovoLog 70/30, but start to take it 15 min before the meals! - 18 units before b'fast - 18 units before dinner  Please continue Levothyroxine 50 mcg.  Take the thyroid hormone every day, with water, at least 30 minutes before breakfast, separated by at least 4 hours from: - acid reflux medications - calcium - iron - multivitamins  Please come back for a follow-up appointment in 3 months.

## 2018-01-08 ENCOUNTER — Encounter: Payer: Self-pay | Admitting: *Deleted

## 2018-01-08 ENCOUNTER — Other Ambulatory Visit: Payer: Self-pay | Admitting: *Deleted

## 2018-01-08 NOTE — Patient Outreach (Signed)
[name]) Care Management   01/08/2018  [name] [name] [birth year] [id number]  [name] [name] is an 84 y.o. female  Subjective:  DIABETES: Pt reports she has tried to eat the right foods with lots of fruits. States her recent appointment to her endocrinologist with a A1C on 4/29 was 8.8 improved from Jan 9.4. Pt she has not been exercising outside due to the hot weather but will ambulate using her stairways in the home with her son's supervision who lives in the home. Pt denies any pain or issues with her limitations but always express a willingness to try. Pt feels she is able to manage her ongoing diabetes and opt to decline ongoing [facility] management of care for a health coach. Pt very proud to have met the initial goals set by this RN case management and has agree to graduating from the [facility] program with all goals met.   Objective:   Review of Systems  All other systems reviewed and are negative.   Physical Exam  Constitutional: She is oriented to person, place, and time. She appears well-developed and well-nourished.  HENT:  Right Ear: External ear normal.  Left Ear: External ear normal.  Eyes: EOM are normal.  Neck: Normal range of motion.  Cardiovascular: Normal heart sounds.  Respiratory: Effort normal and breath sounds normal.  GI: Soft. Bowel sounds are normal.  Musculoskeletal: Normal range of motion.  Neurological: She is alert and oriented to person, place, and time.  Skin: Skin is warm and dry.  Psychiatric: She has a normal mood and affect. Her behavior is normal. Judgment and thought content normal.    Encounter Medications:   Outpatient Encounter Medications as of 01/08/2018  Medication Sig  . aspirin EC 325 MG tablet Take 325 mg by mouth daily.  . carvedilol (COREG) 12.5 MG tablet Take 6.25 mg by mouth 2 (two) times daily.  . clopidogrel (PLAVIX) 75 MG tablet TAKE 1 TABLET BY MOUTH ONCE DAILY  . co-enzyme Q-10 30 MG capsule Take 30 mg by mouth daily.   Marland Kitchen FREESTYLE LITE test strip USE ONE STRIP TO CHECK GLUCOSE TWICE DAILY  . furosemide (LASIX) 20 MG tablet Take 20 mg by mouth daily as needed for fluid.   Marland Kitchen glipiZIDE (GLUCOTROL) 10 MG tablet   . hydrOXYzine (ATARAX/VISTARIL) 25 MG tablet Take 25 mg by mouth 3 (three) times daily as needed for itching.  Marland Kitchen ibuprofen (ADVIL,MOTRIN) 200 MG tablet Take 400 mg by mouth daily as needed for headache or moderate pain.  Marland Kitchen insulin aspart protamine - aspart (NOVOLOG MIX 70/30 FLEXPEN) (70-30) 100 UNIT/ML FlexPen Inject 0.18 mLs (18 Units total) into the skin 2 (two) times daily before a meal.  . levothyroxine (SYNTHROID, LEVOTHROID) 50 MCG tablet TAKE 1 TABLET BY MOUTH ONCE DAILY  . lisinopril (PRINIVIL,ZESTRIL) 5 MG tablet Take 1 tablet (5 mg total) by mouth 2 (two) times daily.  . Omega-3 Fatty Acids (EQL OMEGA 3 FISH OIL) 1200 MG CAPS Take 1,200 mg by mouth daily.  . pantoprazole (PROTONIX) 40 MG tablet TAKE ONE TABLET BY MOUTH ONCE DAILY  . rosuvastatin (CRESTOR) 20 MG tablet TAKE 1 TABLET BY MOUTH ONCE DAILY  . Vitamin D, Ergocalciferol, (DRISDOL) 50000 units CAPS capsule Take 1 capsule by mouth every 7 (seven) days. (MONDAYS)   No facility-administered encounter medications on file as of 01/08/2018.     Functional Status:   In your present state of health, do you have any difficulty performing the following activities: 01/08/2018 08/28/2017  Hearing? N -  Vision? N N  Difficulty concentrating or making decisions? N N  Walking or climbing stairs? Y Y  Dressing or bathing? N N  Doing errands, shopping? Tempie Donning  Preparing Food and eating ? N N  Using the Toilet? N N  In the past six months, have you accidently leaked urine? Y Y  Do you have problems with loss of bowel control? N N  Managing your Medications? N Y  Managing your Finances? N Y  Housekeeping or managing your Housekeeping? N N  Some recent data might be hidden    Fall/Depression Screening:    Fall Risk  01/08/2018 08/28/2017 02/25/2017   Falls in the past year? No No No  Comment - - -  Number falls in past yr: - - -  Comment - - -  Injury with Fall? - - -  Risk Factor Category  - - -  Risk for fall due to : - - -  Follow up - - -   PHQ 2/9 Scores 01/08/2018 08/28/2017 11/06/2016 11/01/2016  PHQ - 2 Score 0 0 0 2  PHQ- 9 Score - - - 4   BP 128/62 (BP Location: Left Arm, Patient Position: Sitting, Cuff Size: Normal)   Pulse 69   Resp 20   Ht 1.651 m ('5\' 5"'$ )   Wt 143 lb (64.9 kg)   SpO2 98%   BMI 23.80 kg/m  Assessment:   Ongoing case management related diabetes  Plan:  Will verify pt's continue to complete BS twice daily as prescribed and has sufficient documentation with her readings. Will document the 02/22/29 averages and continue to enough adherence with her eating habits and mobility. Will also verify pt's recent A1C with her endocrinologist noted in [place] at 8.8 on 12/08/2017 from 9.4 09/09/2017. Will praise pt for her efforts in being compliant with managing her diabetes. Will continue to encouraged adherent with avoid certain food items that are unhealthy and to continue to consume 3 meals daily with healthy snacks in between. Plan of care discussed again with all goals met based upon the interventions that were in place. Will continue to offer any supportive management with a health coach however pt declined and very proud to have met all her goals in managing her diabetes. Will verify no other resources are needed and notify pt's primary provider that pt will be graduating from the [facility] program with all goals met. No other issues or needs to address at this time and pt does not have any other inquires or concerns but expressed her gratitude for RN's assistance over the last several months. Case will be closed and pt is aware on how to contact this RN case manager is needed in the future.   Raina Mina, RN Care Management Coordinator [place] Office [phone]

## 2018-01-12 ENCOUNTER — Other Ambulatory Visit: Payer: Self-pay | Admitting: Internal Medicine

## 2018-01-14 ENCOUNTER — Telehealth: Payer: Self-pay | Admitting: Emergency Medicine

## 2018-01-14 NOTE — Telephone Encounter (Signed)
Called patient to schedule AWV. Patient did not answer and no VM set up. 

## 2018-01-15 ENCOUNTER — Telehealth: Payer: Self-pay | Admitting: Internal Medicine

## 2018-01-15 NOTE — Telephone Encounter (Signed)
Patient's number is giving a busy sound when I call it.   Called son's phone and LVM to inform their mother needs to call the office and set up an appointment so that she can get refills. She is a year over due.

## 2018-01-15 NOTE — Telephone Encounter (Signed)
Copied from CRM [id number]. Topic: Quick Communication - See Telephone Encounter >> Jan 15, 2018  3:26 PM Diana Eves B wrote: CRM for notification. See Telephone encounter for: 01/15/18.  Pt's son is requesting refill on carvedilol (COREG) 12.5 MG tablet sent to [facility] - [place], [place] - [address]. Pt is out of medication.

## 2018-01-15 NOTE — Telephone Encounter (Signed)
Pt is overdue for yearly appt she has not seen MD since 12/2016. Per office policy will need to make yearly appt to received 90 day script to mail order. We are only allowed to send 30 day supply on med to local pharmacy until appt. Pls make yearly appt...[name] [name]

## 2018-01-20 ENCOUNTER — Other Ambulatory Visit: Payer: Self-pay | Admitting: Internal Medicine

## 2018-01-26 ENCOUNTER — Other Ambulatory Visit: Payer: Self-pay | Admitting: Internal Medicine

## 2018-01-26 DIAGNOSIS — I5022 Chronic systolic (congestive) heart failure: Secondary | ICD-10-CM

## 2018-02-03 ENCOUNTER — Encounter (HOSPITAL_COMMUNITY): Payer: Self-pay

## 2018-02-03 ENCOUNTER — Emergency Department (HOSPITAL_COMMUNITY): Payer: Medicare Other

## 2018-02-03 ENCOUNTER — Emergency Department (HOSPITAL_COMMUNITY)
Admission: EM | Admit: 2018-02-03 | Discharge: 2018-02-03 | Disposition: A | Discharge status: Home / Self Care | Payer: Medicare Other | Attending: Emergency Medicine | Admitting: Emergency Medicine

## 2018-02-03 ENCOUNTER — Other Ambulatory Visit: Payer: Self-pay

## 2018-02-03 DIAGNOSIS — E119 Type 2 diabetes mellitus without complications: Secondary | ICD-10-CM | POA: Diagnosis not present

## 2018-02-03 DIAGNOSIS — Z87891 Personal history of nicotine dependence: Secondary | ICD-10-CM | POA: Insufficient documentation

## 2018-02-03 DIAGNOSIS — Z79899 Other long term (current) drug therapy: Secondary | ICD-10-CM | POA: Diagnosis not present

## 2018-02-03 DIAGNOSIS — S32010A Wedge compression fracture of first lumbar vertebra, initial encounter for closed fracture: Secondary | ICD-10-CM

## 2018-02-03 DIAGNOSIS — S32020A Wedge compression fracture of second lumbar vertebra, initial encounter for closed fracture: Secondary | ICD-10-CM | POA: Diagnosis not present

## 2018-02-03 DIAGNOSIS — I2510 Atherosclerotic heart disease of native coronary artery without angina pectoris: Secondary | ICD-10-CM | POA: Insufficient documentation

## 2018-02-03 DIAGNOSIS — Y9301 Activity, walking, marching and hiking: Secondary | ICD-10-CM | POA: Diagnosis not present

## 2018-02-03 DIAGNOSIS — Y999 Unspecified external cause status: Secondary | ICD-10-CM | POA: Diagnosis not present

## 2018-02-03 DIAGNOSIS — I5042 Chronic combined systolic (congestive) and diastolic (congestive) heart failure: Secondary | ICD-10-CM | POA: Insufficient documentation

## 2018-02-03 DIAGNOSIS — W1830XA Fall on same level, unspecified, initial encounter: Secondary | ICD-10-CM | POA: Diagnosis not present

## 2018-02-03 DIAGNOSIS — Z043 Encounter for examination and observation following other accident: Secondary | ICD-10-CM | POA: Diagnosis present

## 2018-02-03 DIAGNOSIS — Y92009 Unspecified place in unspecified non-institutional (private) residence as the place of occurrence of the external cause: Secondary | ICD-10-CM | POA: Insufficient documentation

## 2018-02-03 DIAGNOSIS — S22080A Wedge compression fracture of T11-T12 vertebra, initial encounter for closed fracture: Secondary | ICD-10-CM | POA: Diagnosis not present

## 2018-02-03 DIAGNOSIS — S8991XA Unspecified injury of right lower leg, initial encounter: Secondary | ICD-10-CM | POA: Diagnosis not present

## 2018-02-03 DIAGNOSIS — Z794 Long term (current) use of insulin: Secondary | ICD-10-CM | POA: Diagnosis not present

## 2018-02-03 DIAGNOSIS — E039 Hypothyroidism, unspecified: Secondary | ICD-10-CM | POA: Diagnosis not present

## 2018-02-03 DIAGNOSIS — S79911A Unspecified injury of right hip, initial encounter: Secondary | ICD-10-CM | POA: Diagnosis not present

## 2018-02-03 DIAGNOSIS — I10 Essential (primary) hypertension: Secondary | ICD-10-CM | POA: Diagnosis not present

## 2018-02-03 DIAGNOSIS — M25551 Pain in right hip: Secondary | ICD-10-CM | POA: Diagnosis not present

## 2018-02-03 DIAGNOSIS — W19XXXA Unspecified fall, initial encounter: Secondary | ICD-10-CM

## 2018-02-03 MED ORDER — HYDROCODONE-ACETAMINOPHEN 5-325 MG PO TABS: ORAL_TABLET | ORAL | 0 refills | Status: DC

## 2018-02-03 MED ORDER — HYDROCODONE-ACETAMINOPHEN 5-325 MG PO TABS
1.0000 | ORAL_TABLET | Freq: Once | ORAL | Status: AC
  Administered 2018-02-03: 1 via ORAL
  Filled 2018-02-03: qty 1

## 2018-02-03 MED ORDER — MORPHINE SULFATE (PF) 2 MG/ML IV SOLN
1.0000 mg | Freq: Once | INTRAVENOUS | Status: AC
  Administered 2018-02-03: 1 mg via INTRAMUSCULAR
  Filled 2018-02-03: qty 1

## 2018-02-03 NOTE — ED Provider Notes (Signed)
[facility]   CSN: [id number] Arrival date & time: 02/03/18  1224     History   Chief Complaint Chief Complaint  Patient presents with  . Fall    HPI [name] [name] is a 84 y.o. female.  HPI  Pt was seen at 1300. Per pt, c/o sudden onset and resolution of one episode of slip and fall that occurred approximately midnight when she was walking from her bedroom to the bathroom. States she fell onto her right hip and back. Pt states she called for a family member to help her stand up. Pt states she has been walking with a cane since the fall. Pt c/o right sided low back and right hip pain. Denies hitting head, no LOC, no syncope, no AMS, no neck pain, no CP/palpitations, no SOB/cough, no abd pain, no N/V/D, no focal motor weakness, no tingling/numbness in extremities.   Past Medical History:  Diagnosis Date  . Chronic combined systolic and diastolic CHF, NYHA class 3 (HCC)    a. 03/2013 Echo: EF 25-30%, diff HK, sev antsept HK, mild MR.  . Congestive dilated cardiomyopathy (HCC)    a. 03/2013 Echo: EF 25-30%;  b. 09/2013 s/p SJM 3242 CRT-P.  Marland Kitchen Coronary artery disease    a. 08/2012 Cath: LM nl, LAD 64m, LCX nondom, mild-mod nonobs dzs mid, OM1 ok, OM2 90/90p, RCA 40, EF 25-30%-->Med Rx.  . GERD (gastroesophageal reflux disease)   . High cholesterol   . Hypertension   . Hypothyroidism   . Left bundle branch block   . Osteoarthritis   . Stroke (HCC)   . Type II diabetes mellitus [facility])     Patient Active Problem List   Diagnosis Date Noted  . Encounter for post-traumatic wound check 12/16/2016  . Critical lower limb ischemia 06/17/2016  . Diabetic neuropathy, painful (HCC) 04/24/2016  . Left carotid artery stenosis   . Hypertriglyceridemia, essential 01/10/2016  . 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 Type 2 diabetes mellitus with diabetic neuropathy, with long-term current use of  insulin (HCC) 10/06/2015  . Deficiency anemia 03/10/2014  . Osteoporosis, unspecified 03/10/2014  . Left bundle branch block 07/01/2013  . Coronary atherosclerosis of native coronary artery 02/16/2013  . Chronic systolic heart failure (HCC) 08/16/2012  . Congestive dilated cardiomyopathy (HCC) 08/02/2012  . HTN (hypertension) 08/02/2012  . Familial tremor 11/29/2010  . VITAMIN D DEFICIENCY 04/04/2010  . Osteoarthrosis, unspecified whether generalized or localized, unspecified site 04/04/2010  . Hyperlipidemia with target LDL less than 70 01/19/2007  . Hypothyroidism 01/14/2007    Past Surgical History:  Procedure Laterality Date  . ABDOMINAL HYSTERECTOMY  1980  . BI-VENTRICULAR PACEMAKER INSERTION N/A 10/01/2013   Procedure: BI-VENTRICULAR PACEMAKER INSERTION (CRT-P);  Surgeon: Duke Salvia, MD;  Location[facility] C[facility] LAB;  Service: Cardiovascular;  Laterality: N/A;  . BI-VENTRICULAR PACEMAKER INSERTION (CRT-P)     a. 09/2013 s/p SJM 3242 CRT-P.  . Bilateral cateract surgery    . BMD  2006  . IR GENERIC HISTORICAL  10/18/2016   IR ANGIO INTRA EXTRACRAN SEL COM CAROTID INNOMINATE BILAT MOD SED 10/18/2016 Julieanne Cotton, MD [facility]-INTERV RAD  . IR GENERIC HISTORICAL  10/18/2016   IR ANGIO VERTEBRAL SEL SUBCLAVIAN INNOMINATE BILAT MOD SED 10/18/2016 Julieanne Cotton, MD [facility]-INTERV RAD  . ORIF TIBIA PLATEAU Right 12/04/2013   Procedure: OPEN REDUCTION INTERNAL FIXATION (ORIF) TIBIAL PLATEAU;  Surgeon: Verlee Rossetti, MD;  Location: [facility] ORS;  Service:  Orthopedics;  Laterality: Right;  . PERIPHERAL VASCULAR CATHETERIZATION N/A 06/17/2016   Procedure: Lower Extremity Angiography;  Surgeon: Runell Gess, MD;  Location: [facility] INVASIVE CV LAB;  Service: Cardiovascular;  Laterality: N/A;  . PERIPHERAL VASCULAR CATHETERIZATION Left 06/17/2016   Procedure: Peripheral Vascular Atherectomy;  Surgeon: Runell Gess, MD;  Location: [facility] INVASIVE CV LAB;  Service: Cardiovascular;  Laterality: Left;  Popliteal   .  RIGHT HEART CATHETERIZATION N/A 09/08/2012   Procedure: RIGHT HEART CATH;  Surgeon: Dolores Patty, MD;  Location: [facility] CATH LAB;  Service: Cardiovascular;  Laterality: N/A;     OB History   None      Home Medications    Prior to Admission medications   Medication Sig Start Date End Date Taking? Authorizing Provider  aspirin EC 81 MG tablet Take 81 mg by mouth daily.    Yes [provider]  carvedilol (COREG) 12.5 MG tablet Take 6.25 mg by mouth 2 (two) times daily.   Yes [provider]  clopidogrel (PLAVIX) 75 MG tablet TAKE 1 TABLET BY MOUTH ONCE DAILY 01/12/18  Yes [name], MD  co-enzyme Q-10 30 MG capsule Take 30 mg by mouth daily.   Yes [provider]  FREESTYLE LITE test strip USE ONE STRIP TO CHECK GLUCOSE TWICE DAILY 12/25/16  Yes [name], MD  furosemide (LASIX) 20 MG tablet Take 20 mg by mouth daily as needed for fluid.    Yes [provider]  glipiZIDE (GLUCOTROL) 10 MG tablet Take 10 mg by mouth daily.  09/01/17  Yes [provider]  hydrOXYzine (ATARAX/VISTARIL) 25 MG tablet Take 25 mg by mouth 3 (three) times daily as needed for itching.   Yes [provider]  ibuprofen (ADVIL,MOTRIN) 200 MG tablet Take 400 mg by mouth daily as needed for headache or moderate pain.   Yes [provider]  insulin aspart protamine - aspart (NOVOLOG MIX 70/30 FLEXPEN) (70-30) 100 UNIT/ML FlexPen Inject 0.18 mLs (18 Units total) into the skin 2 (two) times daily before a meal. 09/09/17  Yes Carlus Pavlov, MD  levothyroxine (SYNTHROID, LEVOTHROID) 50 MCG tablet TAKE 1 TABLET BY MOUTH ONCE DAILY 09/01/17  Yes Carlus Pavlov, MD  lisinopril (PRINIVIL,ZESTRIL) 5 MG tablet Take 1 tablet (5 mg total) by mouth 2 (two) times daily. 01/16/16  Yes [name], MD  rosuvastatin (CRESTOR) 20 MG tablet TAKE 1 TABLET BY MOUTH ONCE DAILY 01/20/18  Yes [name], MD  Vitamin D, Ergocalciferol, (DRISDOL) 50000 units CAPS  capsule Take 1 capsule by mouth every 7 (seven) days. (MONDAYS) 12/10/16  Yes [provider]    Family History Family History  Problem Relation Age of Onset  . Diabetes Mother   . Diabetes Other   . Hypertension Other   . Uterine cancer Other   . Heart attack Brother     Social History Social History   Tobacco Use  . Smoking status: Former Games developer  . Smokeless tobacco: Former Engineer, water Use Topics  . Alcohol use: No  . Drug use: No     Allergies   Statins; Metformin and related; and Vytorin [ezetimibe-simvastatin]   Review of Systems Review of Systems ROS: Statement: All systems negative except as marked or noted in the HPI; Constitutional: Negative for fever and chills. ; ; Eyes: Negative for eye pain, redness and discharge. ; ; ENMT: Negative for ear pain, hoarseness, nasal congestion, sinus pressure and sore throat. ; ; Cardiovascular: Negative for chest  pain, palpitations, diaphoresis, dyspnea and peripheral edema. ; ; Respiratory: Negative for cough, wheezing and stridor. ; ; Gastrointestinal: Negative for nausea, vomiting, diarrhea, abdominal pain, blood in stool, hematemesis, jaundice and rectal bleeding. . ; ; Genitourinary: Negative for dysuria, flank pain and hematuria. ; ; Musculoskeletal: +low back pain, right hip pain. Negative for neck pain. Negative for swelling and deformity.;; Skin: Negative for pruritus, rash, abrasions, blisters, bruising and skin lesion.; ; Neuro: Negative for headache, lightheadedness and neck stiffness. Negative for weakness, altered level of consciousness, altered mental status, extremity weakness, paresthesias, involuntary movement, seizure and syncope.       Physical Exam Updated Vital Signs BP (!) 144/77 (BP Location: Right Arm)   Pulse 63   Temp (!) 97.5 F (36.4 C) (Oral)   Resp 18   Ht 5\' 5"  (1.651 m)   Wt 64.9 kg (143 lb)   SpO2 97%   BMI 23.80 kg/m   Physical Exam 1305: Physical examination:  Nursing notes  reviewed; Vital signs and O2 SAT reviewed;  Constitutional: Well developed, Well nourished, Well hydrated, In no acute distress; Head:  Normocephalic, atraumatic; Eyes: EOMI, PERRL, No scleral icterus; ENMT: Mouth and pharynx normal, Mucous membranes moist; Neck: Supple, Full range of motion, No lymphadenopathy; Cardiovascular: Regular rate and rhythm, No gallop; Respiratory: Breath sounds clear & equal bilaterally, No wheezes.  Speaking full sentences with ease, Normal respiratory effort/excursion; Chest: Nontender, Movement normal; Abdomen: Soft, Nontender, Nondistended, Normal bowel sounds; Genitourinary: No CVA tenderness; Spine:  No midline CS, TS, LS tenderness. +TTP right lumbar paraspinal muscles. No abrasions or ecchymosis.;; Extremities: Peripheral pulses normal, Pelvis stable. +right hip tenderness to palp, no deformity. NT right knee/ankle/foot. No edema, No calf edema or asymmetry.; Neuro: AA&Ox3, Major CN grossly intact.  Speech clear. No gross focal motor or sensory deficits in extremities.; Skin: Color normal, Warm, Dry.   ED Treatments / Results  Labs (all labs ordered are listed, but only abnormal results are displayed)   EKG None  Radiology   Procedures Procedures (including critical care time)  Medications Ordered in ED Medications  morphine 2 MG/ML injection 1 mg (1 mg Intramuscular Given 02/03/18 1313)     Initial Impression / Assessment and Plan / ED Course  I have reviewed the triage vital signs and the nursing notes.  Pertinent labs & imaging results that were available during my care of the patient were reviewed by me and considered in my medical decision making (see chart for details).  .MDM Reviewed: previous chart, nursing note and vitals Interpretation: x-ray and CT scan   Ct Lumbar Spine Wo Contrast Result Date: 02/03/2018 CLINICAL DATA:  Back pain. EXAM: CT LUMBAR SPINE WITHOUT CONTRAST TECHNIQUE: Multidetector CT imaging of the lumbar spine was  performed without intravenous contrast administration. Multiplanar CT image reconstructions were also generated. COMPARISON:  None. FINDINGS: Segmentation: 5 lumbar type vertebrae. Alignment: Grade 1 anterolisthesis of L3 on L4 secondary to facet disease. Vertebrae: Generalized osteopenia. Acute T12 vertebral body compression fracture with approximately 50% anterior height loss. Acute L1 vertebral body compression fracture with less than 10% height loss. Acute L2 vertebral body compression fracture with approximately 10% height loss. No discitis or osteomyelitis. No aggressive osseous lesion. Paraspinal and other soft tissues: No acute paraspinal abnormality. Abdominal aortic atherosclerosis. Other: Severe osteoarthritis of bilateral sacroiliac joints. Disc levels: Disc spaces are maintained. T12-L1: Mild broad-based disc bulge. No foraminal or central canal stenosis. L1-L2: No disc protrusion, foraminal stenosis or central canal stenosis. L2-L3: Mild broad-based disc bulge.  No foraminal or central canal stenosis. Mild bilateral facet arthropathy. L3-L4: Broad-based disc bulge. Moderate bilateral facet arthropathy. Mild spinal stenosis. No foraminal stenosis. L4-L5: Broad-based disc bulge. Moderate bilateral facet arthropathy. No foraminal stenosis. L5-S1: Mild broad-based disc bulge. No foraminal or central canal stenosis. IMPRESSION: 1. Acute T12 vertebral body compression fracture with approximately 50% anterior height loss. 2. Acute L1 vertebral body compression fracture with less than 10% height loss. 3. Acute L2 vertebral body compression fracture with approximately 10% height loss. 4.  Aortic Atherosclerosis (ICD10-I70.0). Electronically Signed   By: Elige Ko   On: 02/03/2018 14:39   Dg Knee Complete 4 Views Right Result Date: 02/03/2018 CLINICAL DATA:  Fall, right low back and hip pain EXAM: RIGHT KNEE - COMPLETE 4+ VIEW; DG HIP (WITH OR WITHOUT PELVIS) 2-3V RIGHT COMPARISON:  None. FINDINGS: Right  hip with pelvis: Bilateral SI joint arthropathy with sclerosis and bony spurring. Aortoiliac atherosclerosis. Bony pelvis intact. Mild hip degenerative change. Right hip appears intact without fracture or malalignment. Right knee: Previous ORIF for a lateral tibial plateau fracture. Bones are osteopenic. Moderate degenerative change likely post-traumatic of the right knee joint with joint space loss, sclerosis and bony spurring. Normal alignment. No joint effusion. No acute osseous finding. Peripheral atherosclerosis noted. No definite soft tissue abnormality or focal swelling. IMPRESSION: RIGHT HIP AND PELVIS: No acute osseous finding Bilateral SI joint arthropathy (sacroiliitis) Aortoiliac atherosclerosis Osteopenia RIGHT KNEE: Previous right knee lateral tibial plateau ORIF Right knee posttraumatic degenerative osteoarthritis Osteopenia No acute finding by plain radiography Peripheral atherosclerosis Electronically Signed   By: Judie Petit.  Shick M.D.   On: 02/03/2018 13:51   Dg Hip Unilat With Pelvis 2-3 Views Right Result Date: 02/03/2018 CLINICAL DATA:  Fall, right low back and hip pain EXAM: RIGHT KNEE - COMPLETE 4+ VIEW; DG HIP (WITH OR WITHOUT PELVIS) 2-3V RIGHT COMPARISON:  None. FINDINGS: Right hip with pelvis: Bilateral SI joint arthropathy with sclerosis and bony spurring. Aortoiliac atherosclerosis. Bony pelvis intact. Mild hip degenerative change. Right hip appears intact without fracture or malalignment. Right knee: Previous ORIF for a lateral tibial plateau fracture. Bones are osteopenic. Moderate degenerative change likely post-traumatic of the right knee joint with joint space loss, sclerosis and bony spurring. Normal alignment. No joint effusion. No acute osseous finding. Peripheral atherosclerosis noted. No definite soft tissue abnormality or focal swelling. IMPRESSION: RIGHT HIP AND PELVIS: No acute osseous finding Bilateral SI joint arthropathy (sacroiliitis) Aortoiliac atherosclerosis Osteopenia  RIGHT KNEE: Previous right knee lateral tibial plateau ORIF Right knee posttraumatic degenerative osteoarthritis Osteopenia No acute finding by plain radiography Peripheral atherosclerosis Electronically Signed   By: Judie Petit.  Shick M.D.   On: 02/03/2018 13:51     1505:  XR reassuring. CT with T12, L1, L2 compression fx. Will place TLSO. Pain controlled with morphine IM. Will dose norco while waiting for TLSO. Pt and family state she has a walker at home. T/C returned from Neurosurgery Dr. Jordan Likes, case discussed, including:  HPI, pertinent PM/SHx, VS/PE, dx testing, ED course and treatment:  Agrees with TLSO brace, pain control, walk with walker, f/u office 2 to 3 weeks. Dx and testing, as well as d/w Neurosurgeon, d/w pt and family.  Questions answered.  Verb understanding, agreeable to d/c home with outpt f/u.     Final Clinical Impressions(s) / ED Diagnoses   Final diagnoses:  None    ED Discharge Orders    None       Samuel Jester, DO 02/06/18 1259

## 2018-02-03 NOTE — ED Triage Notes (Signed)
Patient states she tripped while walking from her bed to the bathroom. Patient c/o right lower back pain. Patient denies hitting her head. Patient does take ASA and Plavix.

## 2018-02-03 NOTE — ED Notes (Signed)
[employer] representative is here and assisted with applying TLSO brace.

## 2018-02-03 NOTE — Discharge Instructions (Addendum)
Take the prescription as directed. Wear the back brace and walk with your walker until you are seen in follow up.  Apply moist heat or ice to the area(s) of discomfort, for 15 minutes at a time, several times per day for the next few days.  Do not fall asleep on a heating or ice pack.  Call your regular medical doctor today to schedule a follow up appointment in the next 3 days. Call the Neurosurgeon today to schedule a follow up appointment within the next 2 to 3 weeks.  Return to the Emergency Department immediately if worsening.

## 2018-02-05 ENCOUNTER — Encounter: Payer: Self-pay | Admitting: Internal Medicine

## 2018-02-09 ENCOUNTER — Emergency Department (HOSPITAL_COMMUNITY): Payer: Medicare Other

## 2018-02-09 ENCOUNTER — Ambulatory Visit (INDEPENDENT_AMBULATORY_CARE_PROVIDER_SITE_OTHER): Payer: Medicare Other | Admitting: *Deleted

## 2018-02-09 ENCOUNTER — Other Ambulatory Visit: Payer: Self-pay

## 2018-02-09 ENCOUNTER — Encounter (HOSPITAL_COMMUNITY): Payer: Self-pay

## 2018-02-09 ENCOUNTER — Inpatient Hospital Stay (HOSPITAL_COMMUNITY)
Admission: EM | Admit: 2018-02-09 | Discharge: 2018-02-14 | DRG: 682 | Disposition: A | Discharge status: Skilled Nursing Facility | Payer: Medicare Other | Attending: Family Medicine | Admitting: Family Medicine

## 2018-02-09 ENCOUNTER — Other Ambulatory Visit: Payer: Self-pay | Admitting: Internal Medicine

## 2018-02-09 ENCOUNTER — Inpatient Hospital Stay (HOSPITAL_COMMUNITY): Payer: Medicare Other

## 2018-02-09 DIAGNOSIS — M2550 Pain in unspecified joint: Secondary | ICD-10-CM | POA: Diagnosis not present

## 2018-02-09 DIAGNOSIS — E1122 Type 2 diabetes mellitus with diabetic chronic kidney disease: Secondary | ICD-10-CM | POA: Diagnosis present

## 2018-02-09 DIAGNOSIS — I5042 Chronic combined systolic (congestive) and diastolic (congestive) heart failure: Secondary | ICD-10-CM | POA: Diagnosis present

## 2018-02-09 DIAGNOSIS — E1140 Type 2 diabetes mellitus with diabetic neuropathy, unspecified: Secondary | ICD-10-CM | POA: Diagnosis not present

## 2018-02-09 DIAGNOSIS — E785 Hyperlipidemia, unspecified: Secondary | ICD-10-CM | POA: Diagnosis not present

## 2018-02-09 DIAGNOSIS — Z79899 Other long term (current) drug therapy: Secondary | ICD-10-CM

## 2018-02-09 DIAGNOSIS — F0390 Unspecified dementia without behavioral disturbance: Secondary | ICD-10-CM | POA: Diagnosis present

## 2018-02-09 DIAGNOSIS — Z888 Allergy status to other drugs, medicaments and biological substances status: Secondary | ICD-10-CM | POA: Diagnosis not present

## 2018-02-09 DIAGNOSIS — Z8673 Personal history of transient ischemic attack (TIA), and cerebral infarction without residual deficits: Secondary | ICD-10-CM

## 2018-02-09 DIAGNOSIS — E876 Hypokalemia: Secondary | ICD-10-CM | POA: Diagnosis not present

## 2018-02-09 DIAGNOSIS — N390 Urinary tract infection, site not specified: Secondary | ICD-10-CM

## 2018-02-09 DIAGNOSIS — K219 Gastro-esophageal reflux disease without esophagitis: Secondary | ICD-10-CM | POA: Diagnosis present

## 2018-02-09 DIAGNOSIS — E875 Hyperkalemia: Secondary | ICD-10-CM | POA: Diagnosis present

## 2018-02-09 DIAGNOSIS — R262 Difficulty in walking, not elsewhere classified: Secondary | ICD-10-CM | POA: Diagnosis not present

## 2018-02-09 DIAGNOSIS — Z794 Long term (current) use of insulin: Secondary | ICD-10-CM

## 2018-02-09 DIAGNOSIS — A419 Sepsis, unspecified organism: Secondary | ICD-10-CM | POA: Diagnosis not present

## 2018-02-09 DIAGNOSIS — E860 Dehydration: Secondary | ICD-10-CM | POA: Diagnosis not present

## 2018-02-09 DIAGNOSIS — E872 Acidosis: Secondary | ICD-10-CM | POA: Diagnosis present

## 2018-02-09 DIAGNOSIS — I130 Hypertensive heart and chronic kidney disease with heart failure and stage 1 through stage 4 chronic kidney disease, or unspecified chronic kidney disease: Secondary | ICD-10-CM | POA: Diagnosis present

## 2018-02-09 DIAGNOSIS — I420 Dilated cardiomyopathy: Secondary | ICD-10-CM | POA: Diagnosis not present

## 2018-02-09 DIAGNOSIS — N289 Disorder of kidney and ureter, unspecified: Secondary | ICD-10-CM

## 2018-02-09 DIAGNOSIS — M4856XA Collapsed vertebra, not elsewhere classified, lumbar region, initial encounter for fracture: Secondary | ICD-10-CM | POA: Diagnosis present

## 2018-02-09 DIAGNOSIS — Z950 Presence of cardiac pacemaker: Secondary | ICD-10-CM | POA: Diagnosis not present

## 2018-02-09 DIAGNOSIS — Z7982 Long term (current) use of aspirin: Secondary | ICD-10-CM

## 2018-02-09 DIAGNOSIS — I428 Other cardiomyopathies: Secondary | ICD-10-CM

## 2018-02-09 DIAGNOSIS — E7800 Pure hypercholesterolemia, unspecified: Secondary | ICD-10-CM | POA: Diagnosis present

## 2018-02-09 DIAGNOSIS — G9341 Metabolic encephalopathy: Secondary | ICD-10-CM | POA: Diagnosis present

## 2018-02-09 DIAGNOSIS — E119 Type 2 diabetes mellitus without complications: Secondary | ICD-10-CM | POA: Diagnosis not present

## 2018-02-09 DIAGNOSIS — M4854XA Collapsed vertebra, not elsewhere classified, thoracic region, initial encounter for fracture: Secondary | ICD-10-CM | POA: Diagnosis present

## 2018-02-09 DIAGNOSIS — N179 Acute kidney failure, unspecified: Secondary | ICD-10-CM | POA: Diagnosis not present

## 2018-02-09 DIAGNOSIS — N189 Chronic kidney disease, unspecified: Secondary | ICD-10-CM | POA: Diagnosis not present

## 2018-02-09 DIAGNOSIS — I129 Hypertensive chronic kidney disease with stage 1 through stage 4 chronic kidney disease, or unspecified chronic kidney disease: Secondary | ICD-10-CM | POA: Diagnosis not present

## 2018-02-09 DIAGNOSIS — N261 Atrophy of kidney (terminal): Secondary | ICD-10-CM | POA: Diagnosis present

## 2018-02-09 DIAGNOSIS — I10 Essential (primary) hypertension: Secondary | ICD-10-CM | POA: Diagnosis not present

## 2018-02-09 DIAGNOSIS — S32029D Unspecified fracture of second lumbar vertebra, subsequent encounter for fracture with routine healing: Secondary | ICD-10-CM | POA: Diagnosis not present

## 2018-02-09 DIAGNOSIS — E039 Hypothyroidism, unspecified: Secondary | ICD-10-CM | POA: Diagnosis not present

## 2018-02-09 DIAGNOSIS — M6281 Muscle weakness (generalized): Secondary | ICD-10-CM | POA: Diagnosis not present

## 2018-02-09 DIAGNOSIS — Z87891 Personal history of nicotine dependence: Secondary | ICD-10-CM

## 2018-02-09 DIAGNOSIS — S22089D Unspecified fracture of T11-T12 vertebra, subsequent encounter for fracture with routine healing: Secondary | ICD-10-CM | POA: Diagnosis not present

## 2018-02-09 DIAGNOSIS — N183 Chronic kidney disease, stage 3 (moderate): Secondary | ICD-10-CM | POA: Diagnosis present

## 2018-02-09 DIAGNOSIS — I5032 Chronic diastolic (congestive) heart failure: Secondary | ICD-10-CM | POA: Diagnosis not present

## 2018-02-09 DIAGNOSIS — N170 Acute kidney failure with tubular necrosis: Secondary | ICD-10-CM | POA: Diagnosis not present

## 2018-02-09 DIAGNOSIS — R488 Other symbolic dysfunctions: Secondary | ICD-10-CM | POA: Diagnosis not present

## 2018-02-09 DIAGNOSIS — I2510 Atherosclerotic heart disease of native coronary artery without angina pectoris: Secondary | ICD-10-CM | POA: Diagnosis present

## 2018-02-09 DIAGNOSIS — Z7902 Long term (current) use of antithrombotics/antiplatelets: Secondary | ICD-10-CM

## 2018-02-09 DIAGNOSIS — R1312 Dysphagia, oropharyngeal phase: Secondary | ICD-10-CM | POA: Diagnosis not present

## 2018-02-09 DIAGNOSIS — S32019D Unspecified fracture of first lumbar vertebra, subsequent encounter for fracture with routine healing: Secondary | ICD-10-CM | POA: Diagnosis not present

## 2018-02-09 DIAGNOSIS — D638 Anemia in other chronic diseases classified elsewhere: Secondary | ICD-10-CM | POA: Diagnosis present

## 2018-02-09 DIAGNOSIS — Z7401 Bed confinement status: Secondary | ICD-10-CM | POA: Diagnosis not present

## 2018-02-09 DIAGNOSIS — E1151 Type 2 diabetes mellitus with diabetic peripheral angiopathy without gangrene: Secondary | ICD-10-CM | POA: Diagnosis present

## 2018-02-09 LAB — CBC WITH DIFFERENTIAL/PLATELET
Basophils Absolute: 0 10*3/uL (ref 0.0–0.1)
Basophils Relative: 0 %
Eosinophils Absolute: 0 10*3/uL (ref 0.0–0.7)
Eosinophils Relative: 0 %
HEMATOCRIT: 42.4 % (ref 36.0–46.0)
HEMOGLOBIN: 13.8 g/dL (ref 12.0–15.0)
LYMPHS ABS: 1.7 10*3/uL (ref 0.7–4.0)
LYMPHS PCT: 12 %
MCH: 27.1 pg (ref 26.0–34.0)
MCHC: 32.5 g/dL (ref 30.0–36.0)
MCV: 83.1 fL (ref 78.0–100.0)
MONO ABS: 0.7 10*3/uL (ref 0.1–1.0)
Monocytes Relative: 5 %
NEUTROS ABS: 11.6 10*3/uL (ref 1.7–7.7)
Neutrophils Relative %: 83 %
Platelets: UNDETERMINED 10*3/uL (ref 150–400)
RBC: 5.1 MIL/uL (ref 3.87–5.11)
RDW: 16.1 % — ABNORMAL HIGH (ref 11.5–15.5)
WBC: 14 10*3/uL — ABNORMAL HIGH (ref 4.0–10.5)

## 2018-02-09 LAB — COMPREHENSIVE METABOLIC PANEL
ALBUMIN: 3.6 g/dL (ref 3.5–5.0)
ALT: 27 U/L (ref 0–44)
AST: 43 U/L — ABNORMAL HIGH (ref 15–41)
Alkaline Phosphatase: 84 U/L (ref 38–126)
Anion gap: 27 — ABNORMAL HIGH (ref 5–15)
BUN: 91 mg/dL — AB (ref 8–23)
CHLORIDE: 102 mmol/L (ref 98–111)
CO2: 11 mmol/L — AB (ref 22–32)
CREATININE: 8.31 mg/dL — AB (ref 0.44–1.00)
Calcium: 8.5 mg/dL — ABNORMAL LOW (ref 8.9–10.3)
GFR calc Af Amer: 5 mL/min — ABNORMAL LOW (ref >60)
GFR calc non Af Amer: 4 mL/min — ABNORMAL LOW (ref >60)
Glucose, Bld: 282 mg/dL — ABNORMAL HIGH (ref 70–99)
POTASSIUM: 5.3 mmol/L — AB (ref 3.5–5.1)
Sodium: 140 mmol/L (ref 135–145)
Total Bilirubin: 1 mg/dL (ref 0.3–1.2)
Total Protein: 8.6 g/dL — ABNORMAL HIGH (ref 6.5–8.1)

## 2018-02-09 LAB — URINALYSIS, ROUTINE W REFLEX MICROSCOPIC
BILIRUBIN URINE: NEGATIVE
Cellular Cast, UA: 56
Glucose, UA: NEGATIVE mg/dL
Ketones, ur: 5 mg/dL — AB
LEUKOCYTES UA: NEGATIVE
Nitrite: NEGATIVE
PH: 5 (ref 5.0–8.0)
Protein, ur: 100 mg/dL — AB
SPECIFIC GRAVITY, URINE: 1.015 (ref 1.005–1.030)

## 2018-02-09 LAB — I-STAT CG4 LACTIC ACID, ED
Lactic Acid, Venous: 2.38 mmol/L (ref 0.5–1.9)
Lactic Acid, Venous: 0.98 mmol/L (ref 0.5–1.9)

## 2018-02-09 LAB — PROTIME-INR
INR: 1.15
Prothrombin Time: 14.6 seconds (ref 11.4–15.2)

## 2018-02-09 LAB — GLUCOSE, CAPILLARY: GLUCOSE-CAPILLARY: 234 mg/dL — AB (ref 70–99)

## 2018-02-09 MED ORDER — ACETAMINOPHEN 650 MG RE SUPP: 650.0000 mg | Freq: Four times a day (QID) | RECTAL | Status: DC | PRN

## 2018-02-09 MED ORDER — ASPIRIN EC 81 MG PO TBEC
81.0000 mg | DELAYED_RELEASE_TABLET | Freq: Every day | ORAL | Status: DC
  Administered 2018-02-09 – 2018-02-14 (×5): 81 mg via ORAL
  Filled 2018-02-09 (×6): qty 1

## 2018-02-09 MED ORDER — CARVEDILOL 6.25 MG PO TABS
6.2500 mg | ORAL_TABLET | Freq: Two times a day (BID) | ORAL | Status: DC
  Administered 2018-02-09 – 2018-02-14 (×10): 6.25 mg via ORAL
  Filled 2018-02-09 (×10): qty 1

## 2018-02-09 MED ORDER — ONDANSETRON HCL 4 MG/2ML IJ SOLN
4.0000 mg | Freq: Four times a day (QID) | INTRAMUSCULAR | Status: DC | PRN
  Administered 2018-02-10 – 2018-02-11 (×2): 4 mg via INTRAVENOUS
  Filled 2018-02-09 (×2): qty 2

## 2018-02-09 MED ORDER — CLOPIDOGREL BISULFATE 75 MG PO TABS
75.0000 mg | ORAL_TABLET | Freq: Every day | ORAL | Status: DC
  Administered 2018-02-09 – 2018-02-14 (×6): 75 mg via ORAL
  Filled 2018-02-09 (×6): qty 1

## 2018-02-09 MED ORDER — SODIUM CHLORIDE 0.9 % IV BOLUS (SEPSIS)
1000.0000 mL | Freq: Once | INTRAVENOUS | Status: AC
  Administered 2018-02-09: 1000 mL via INTRAVENOUS

## 2018-02-09 MED ORDER — STERILE WATER FOR INJECTION IV SOLN
INTRAVENOUS | Status: DC
  Administered 2018-02-09 – 2018-02-11 (×4): via INTRAVENOUS
  Filled 2018-02-09 (×5): qty 850

## 2018-02-09 MED ORDER — TRAMADOL HCL 50 MG PO TABS
50.0000 mg | ORAL_TABLET | Freq: Once | ORAL | Status: AC
Start: 2018-02-09 — End: 2018-02-09
  Administered 2018-02-09: 50 mg via ORAL
  Filled 2018-02-09: qty 1

## 2018-02-09 MED ORDER — ONDANSETRON HCL 4 MG PO TABS: 4.0000 mg | ORAL_TABLET | Freq: Four times a day (QID) | ORAL | Status: DC | PRN

## 2018-02-09 MED ORDER — ACETAMINOPHEN 325 MG PO TABS
650.0000 mg | ORAL_TABLET | Freq: Four times a day (QID) | ORAL | Status: DC | PRN
  Administered 2018-02-09 – 2018-02-11 (×5): 650 mg via ORAL
  Filled 2018-02-09 (×5): qty 2

## 2018-02-09 MED ORDER — INSULIN ASPART 100 UNIT/ML ~~LOC~~ SOLN
0.0000 [IU] | Freq: Every day | SUBCUTANEOUS | Status: DC
  Administered 2018-02-09: 2 [IU] via SUBCUTANEOUS

## 2018-02-09 MED ORDER — SODIUM BICARBONATE 8.4 % IV SOLN: INTRAVENOUS | Status: DC

## 2018-02-09 MED ORDER — LEVOTHYROXINE SODIUM 50 MCG PO TABS
50.0000 ug | ORAL_TABLET | Freq: Every day | ORAL | Status: DC
Start: 2018-02-10 — End: 2018-02-14
  Administered 2018-02-10 – 2018-02-14 (×5): 50 ug via ORAL
  Filled 2018-02-09 (×5): qty 1

## 2018-02-09 MED ORDER — INSULIN ASPART 100 UNIT/ML ~~LOC~~ SOLN
0.0000 [IU] | Freq: Three times a day (TID) | SUBCUTANEOUS | Status: DC
  Administered 2018-02-10: 2 [IU] via SUBCUTANEOUS
  Administered 2018-02-10: 1 [IU] via SUBCUTANEOUS
  Administered 2018-02-10: 2 [IU] via SUBCUTANEOUS
  Administered 2018-02-11: 1 [IU] via SUBCUTANEOUS
  Administered 2018-02-12 – 2018-02-13 (×5): 2 [IU] via SUBCUTANEOUS
  Administered 2018-02-14: 3 [IU] via SUBCUTANEOUS
  Administered 2018-02-14: 1 [IU] via SUBCUTANEOUS

## 2018-02-09 MED ORDER — HEPARIN SODIUM (PORCINE) 5000 UNIT/ML IJ SOLN
5000.0000 [IU] | Freq: Three times a day (TID) | INTRAMUSCULAR | Status: DC
  Administered 2018-02-09 – 2018-02-14 (×13): 5000 [IU] via SUBCUTANEOUS
  Filled 2018-02-09 (×14): qty 1

## 2018-02-09 MED ORDER — SODIUM CHLORIDE 0.9 % IV SOLN
2.0000 g | INTRAVENOUS | Status: DC
  Administered 2018-02-09 – 2018-02-11 (×3): 2 g via INTRAVENOUS
  Filled 2018-02-09 (×3): qty 20

## 2018-02-09 MED ORDER — SODIUM CHLORIDE 0.9 % IV SOLN: INTRAVENOUS | Status: DC

## 2018-02-09 NOTE — ED Notes (Signed)
ED Provider at bedside. 

## 2018-02-09 NOTE — Progress Notes (Signed)
Remote pacemaker transmission.   

## 2018-02-09 NOTE — ED Notes (Signed)
Report given to [facility]. 

## 2018-02-09 NOTE — ED Notes (Signed)
ED TO INPATIENT HANDOFF REPORT  Name/Age/Gender [name] [name] 84 y.o. female  Code Status    Code Status Orders  (From admission, onward)        Start     Ordered   02/09/18 1659  Full code  Continuous     02/09/18 1658    Code Status History    Date Active Date Inactive Code Status Order ID Comments User Context   06/17/2016 1953 06/18/2016 1340 Full Code [id number]  Lorretta Harp, MD Inpatient   01/10/2016 2255 01/12/2016 1701 Full Code [id number]  Reubin Milan, MD Inpatient   12/04/2013 2325 12/07/2013 1418 Full Code [id number]  Augustin Schooling, MD Inpatient   12/03/2013 1748 12/04/2013 2325 Full Code [id number]  Johnn Hai, MD Inpatient   10/01/2013 1334 10/02/2013 1415 Full Code [id number]  Deboraha Sprang, MD Inpatient   07/30/2012 1857 08/07/2012 1650 Full Code [id number]  [place]well Nickel, RN Inpatient      Home/SNF/Other Home  Chief Complaint back pain / possible UTI / AMS  Level of Care/Admitting Diagnosis ED Disposition    ED Disposition Condition [facility] Area: [place] [100100]  Level of Care: Telemetry [5]  Diagnosis: AKI (acute kidney injury) [facility]) [220254]  Admitting Physician: Coqui, Rosa  Attending Physician: Debbe Odea [[id number]]  Estimated length of stay: past midnight tomorrow  Certification:: I certify this patient will need inpatient services for at least 2 midnights  PT Class (Do Not Modify): Inpatient [101]  PT Acc Code (Do Not Modify): Private [1]       Medical History Past Medical History:  Diagnosis Date  . Chronic combined systolic and diastolic CHF, NYHA class 3 ([place])    a. 03/2013 Echo: EF 25-30%, diff HK, sev antsept HK, mild MR.  . Congestive dilated cardiomyopathy ([place])    a. 03/2013 Echo: EF 25-30%;  b. 09/2013 s/p SJM 3242 CRT-P.  Marland Kitchen Coronary artery disease    a. 08/2012 Cath: LM nl, LAD 35m LCX nondom, mild-mod nonobs dzs mid, OM1 ok, OM2 90/90p, RCA 40, EF 25-30%-->Med Rx.  .  GERD (gastroesophageal reflux disease)   . High cholesterol   . Hypertension   . Hypothyroidism   . Left bundle branch block   . Osteoarthritis   . Stroke ([place]   . Type II diabetes mellitus (HCC)     Allergies Allergies  Allergen Reactions  . Statins Other (See Comments)    Leg cramping  . Metformin And Related     Pt stopped it due to diarrhea  . Vytorin [Ezetimibe-Simvastatin] Other (See Comments)    Leg cramps    IV Location/Drains/Wounds Patient Lines/Drains/Airways Status   Active Line/Drains/Airways    Name:   Placement date:   Placement time:   Site:   Days:   Peripheral IV 02/09/18 Right Antecubital   02/09/18    1213    Antecubital   less than 1   Peripheral IV 02/09/18 Left Antecubital   02/09/18    1213    Antecubital   less than 1   Incision (Closed) 12/04/13 Knee Right   12/04/13    2127     1528   Incision (Closed) 10/18/16 Groin Left   10/18/16    1318     479          Labs/Imaging Results for orders placed or performed during the hospital encounter of 02/09/18 (from the past 48 hour(s))  Comprehensive metabolic panel  Status: Abnormal   Collection Time: 02/09/18 11:47 AM  Result Value Ref Range   Sodium 140 135 - 145 mmol/L    Comment: REPEATED TO VERIFY   Potassium 5.3 (H) 3.5 - 5.1 mmol/L    Comment: NO VISIBLE HEMOLYSIS   Chloride 102 98 - 111 mmol/L    Comment: Please note change in reference range. REPEATED TO VERIFY    CO2 11 (L) 22 - 32 mmol/L    Comment: REPEATED TO VERIFY   Glucose, Bld 282 (H) 70 - 99 mg/dL    Comment: Please note change in reference range.   BUN 91 (H) 8 - 23 mg/dL    Comment: Please note change in reference range. RESULTS CONFIRMED BY MANUAL DILUTION    Creatinine, Ser 8.31 (H) 0.44 - 1.00 mg/dL   Calcium 8.5 (L) 8.9 - 10.3 mg/dL   Total Protein 8.6 (H) 6.5 - 8.1 g/dL   Albumin 3.6 3.5 - 5.0 g/dL   AST 43 (H) 15 - 41 U/L   ALT 27 0 - 44 U/L    Comment: Please note change in reference range.   Alkaline  Phosphatase 84 38 - 126 U/L   Total Bilirubin 1.0 0.3 - 1.2 mg/dL   GFR calc non Af Amer 4 (L) >60 mL/min   GFR calc Af Amer 5 (L) >60 mL/min    Comment: (NOTE) The eGFR has been calculated using the CKD EPI equation. This calculation has not been validated in all clinical situations. eGFR's persistently <60 mL/min signify possible Chronic Kidney Disease.    Anion gap 27 (H) 5 - 15    Comment: REPEATED TO VERIFY Performed at [facility], [place] [address], [place], [place] [address]   CBC with Differential     Status: Abnormal   Collection Time: 02/09/18 11:47 AM  Result Value Ref Range   WBC 14.0 (H) 4.0 - 10.5 K/uL    Comment: WHITE COUNT CONFIRMED ON SMEAR   RBC 5.10 3.87 - 5.11 MIL/uL   Hemoglobin 13.8 12.0 - 15.0 g/dL   HCT 42.4 36.0 - 46.0 %   MCV 83.1 78.0 - 100.0 fL   MCH 27.1 26.0 - 34.0 pg   MCHC 32.5 30.0 - 36.0 g/dL   RDW 16.1 (H) 11.5 - 15.5 %   Platelets PLATELET CLUMPS NOTED ON SMEAR, UNABLE TO ESTIMATE 150 - 400 K/uL   Neutrophils Relative % 83 %   Neutro Abs 11.6 1.7 - 7.7 K/uL   Lymphocytes Relative 12 %   Lymphs Abs 1.7 0.7 - 4.0 K/uL   Monocytes Relative 5 %   Monocytes Absolute 0.7 0.1 - 1.0 K/uL   Eosinophils Relative 0 %   Eosinophils Absolute 0.0 0.0 - 0.7 K/uL   Basophils Relative 0 %   Basophils Absolute 0.0 0.0 - 0.1 K/uL   Smear Review MORPHOLOGY UNREMARKABLE     Comment: Performed at [facility], [place] [address] [place], [place] [address]  Protime-INR     Status: None   Collection Time: 02/09/18 11:47 AM  Result Value Ref Range   Prothrombin Time 14.6 11.4 - 15.2 seconds   INR 1.15     Comment: Performed at [facility], [place] [address], [place],  [address]  I-Stat CG4 Lactic Acid, ED     Status: Abnormal   Collection Time: 02/09/18 12:06 PM  Result Value Ref Range   Lactic Acid, Venous 2.38 (HH) 0.5 - 1.9 mmol/L   Comment NOTIFIED PHYSICIAN  I-Stat CG4 Lactic Acid, ED  (not  at  [facility])     Status: None   Collection Time: 02/09/18  3:56 PM  Result Value Ref Range   Lactic Acid, Venous 0.98 0.5 - 1.9 mmol/L  Urinalysis, Routine w reflex microscopic     Status: Abnormal   Collection Time: 02/09/18  3:59 PM  Result Value Ref Range   Color, Urine YELLOW YELLOW   APPearance CLOUDY (A) CLEAR   Specific Gravity, Urine 1.015 1.005 - 1.030   pH 5.0 5.0 - 8.0   Glucose, UA NEGATIVE NEGATIVE mg/dL   Hgb urine dipstick LARGE (A) NEGATIVE   Bilirubin Urine NEGATIVE NEGATIVE   Ketones, ur 5 (A) NEGATIVE mg/dL   Protein, ur 100 (A) NEGATIVE mg/dL   Nitrite NEGATIVE NEGATIVE   Leukocytes, UA NEGATIVE NEGATIVE   RBC / HPF 6-10 0 - 5 RBC/hpf   WBC, UA 21-50 0 - 5 WBC/hpf   Bacteria, UA RARE (A) NONE SEEN   Squamous Epithelial / LPF 0-5 0 - 5   Mucus PRESENT    Hyaline Casts, UA PRESENT    Cellular Cast, UA 56    Amorphous Crystal PRESENT    Non Squamous Epithelial 0-5 (A) NONE SEEN    Comment: Performed at [facility], [place] [address] [place], [place] [address]   Dg Chest Port 1 View  Result Date: 02/09/2018 CLINICAL DATA:  Sepsis. EXAM: PORTABLE CHEST 1 VIEW COMPARISON:  Radiographs of Jan 10, 2016. FINDINGS: The heart size and mediastinal contours are within normal limits. Both lungs are clear. Hypoinflation of the lungs is noted. Left-sided pacemaker is unchanged in position. No pneumothorax or pleural effusion is noted. The visualized skeletal structures are unremarkable. IMPRESSION: Hypoinflation of the lungs. No acute cardiopulmonary abnormality seen. Electronically Signed   By: Marijo Conception, M.D.   On: 02/09/2018 12:36    Pending Labs Unresulted Labs (From admission, onward)   Start     Ordered   02/10/18 8110  Basic metabolic panel  Tomorrow morning,   R     02/09/18 1658   02/10/18 0500  CBC  Tomorrow morning,   R     02/09/18 1658   02/09/18 1657  CBC  (heparin)  Once,   R    Comments:  Baseline for heparin therapy IF NOT ALREADY  DRAWN.  Notify MD if PLT < 100 K.    02/09/18 1658   02/09/18 1657  Creatinine, serum  (heparin)  Once,   R    Comments:  Baseline for heparin therapy IF NOT ALREADY DRAWN.    02/09/18 1658   02/09/18 1222  Urine culture  STAT,   STAT     02/09/18 1222   02/09/18 1147  Culture, blood (Routine x 2)  BLOOD CULTURE X 2,   STAT     02/09/18 1147      Vitals/Pain Today's Vitals   02/09/18 1700 02/09/18 1730 02/09/18 1800 02/09/18 1830  BP: (!) 119/57 (!) 129/56 (!) 115/56 (!) 120/54  Pulse: 91 90 90 89  Resp:  16  16  Temp:      TempSrc:      SpO2: 98% 97% 95% 94%  Weight:      Height:        Isolation Precautions No active isolations  Medications Medications  cefTRIAXone (ROCEPHIN) 2 g in sodium chloride 0.9 % 100 mL IVPB (0 g Intravenous Stopped 02/09/18 1315)  heparin injection 5,000 Units (has no administration in  time range)  acetaminophen (TYLENOL) tablet 650 mg (has no administration in time range)    Or  acetaminophen (TYLENOL) suppository 650 mg (has no administration in time range)  ondansetron (ZOFRAN) tablet 4 mg (has no administration in time range)    Or  ondansetron (ZOFRAN) injection 4 mg (has no administration in time range)  clopidogrel (PLAVIX) tablet 75 mg (has no administration in time range)  carvedilol (COREG) tablet 6.25 mg (has no administration in time range)  aspirin EC tablet 81 mg (has no administration in time range)  levothyroxine (SYNTHROID, LEVOTHROID) tablet 50 mcg (has no administration in time range)  insulin aspart (novoLOG) injection 0-9 Units (has no administration in time range)  insulin aspart (novoLOG) injection 0-5 Units (has no administration in time range)  sodium bicarbonate 150 mEq in dextrose 5 % 1,000 mL infusion (has no administration in time range)  sodium chloride 0.9 % bolus 1,000 mL (0 mLs Intravenous Stopped 02/09/18 1341)    And  sodium chloride 0.9 % bolus 1,000 mL (0 mLs Intravenous Stopped 02/09/18 1756)     Mobility power wheelchair

## 2018-02-09 NOTE — Consult Note (Signed)
Referring Provider: No ref. provider found Primary Care Physician:  [name], MD Primary Nephrologist:     Reason for Consultation:   Hyperkalemia metabolic acidosis and acute renal failure   HPI:  This is an 84 year old lady that lives at home with her son  She has a history of congestive heart failure  Systolic and diastolic  And has diabetes, hypertension and history of CAD with her last cardiac catheterization in 2014  She has an ejection fraction of 25 - 30 %. She comes into the emergency foom with some malodorous urine and has had a history of a fall with back fractures and has been receiving ibuprofen. The last creatinine that we have was in 3/18 and it looks like she has stage 3 chronic renal disease with a creatinine of 1.4. From the records it appears that she is receiving lisinopril , lasix and ibuprofen  She does appear to be followed by Dr Teressa Lower heart failure team although the last visit was in June 2018   At that time it did appear that she was continued on her current dose of lasix , ibuprofen and lisinopril.  In the emergency room  Labs revealed  Bicarbonate 11  K 5.3 and BUN 91 and Cr 8.31    She has been given 2 L of saline   She was also given rocephin .   Urine output minimal 237 cc bladder scan      Room air oxygen sats 96 %    Past Medical History:  Diagnosis Date  . Chronic combined systolic and diastolic CHF, NYHA class 3 (HCC)    a. 03/2013 Echo: EF 25-30%, diff HK, sev antsept HK, mild MR.  . Congestive dilated cardiomyopathy (HCC)    a. 03/2013 Echo: EF 25-30%;  b. 09/2013 s/p SJM 3242 CRT-P.  Marland Kitchen Coronary artery disease    a. 08/2012 Cath: LM nl, LAD 11m, LCX nondom, mild-mod nonobs dzs mid, OM1 ok, OM2 90/90p, RCA 40, EF 25-30%-->Med Rx.  . GERD (gastroesophageal reflux disease)   . High cholesterol   . Hypertension   . Hypothyroidism   . Left bundle branch block   . Osteoarthritis   . Stroke (HCC)   . Type II diabetes mellitus (HCC)     Past Surgical  History:  Procedure Laterality Date  . ABDOMINAL HYSTERECTOMY  1980  . BI-VENTRICULAR PACEMAKER INSERTION N/A 10/01/2013   Procedure: BI-VENTRICULAR PACEMAKER INSERTION (CRT-P);  Surgeon: Duke Salvia, MD;  Location[facility] CATH LAB;  Service: Cardiovascular;  Laterality: N/A;  . BI-VENTRICULAR PACEMAKER INSERTION (CRT-P)     a. 09/2013 s/p SJM 3242 CRT-P.  . Bilateral cateract surgery    . BMD  2006  . IR GENERIC HISTORICAL  10/18/2016   IR ANGIO INTRA EXTRACRAN SEL COM CAROTID INNOMINATE BILAT MOD SED 10/18/2016 Julieanne Cotton, MD [facility]-INTERV RAD  . IR GENERIC HISTORICAL  10/18/2016   IR ANGIO VERTEBRAL SEL SUBCLAVIAN INNOMINATE BILAT MOD SED 10/18/2016 Julieanne Cotton, MD [facility]  . ORIF TIBIA PLATEAU Right 12/04/2013   Procedure: OPEN REDUCTION INTERNAL FIXATION (ORIF) TIBIAL PLATEAU;  Surgeon: Verlee Rossetti, MD;  Location: [facility] ORS;  Service: Orthopedics;  Laterality: Right;  . PERIPHERAL VASCULAR CATHETERIZATION N/A 06/17/2016   Procedure: Lower Extremity Angiography;  Surgeon: Runell Gess, MD;  Location: [facility] INVASIVE CV LAB;  Service: Cardiovascular;  Laterality: N/A;  . PERIPHERAL VASCULAR CATHETERIZATION Left 06/17/2016   Procedure: Peripheral Vascular Atherectomy;  Surgeon: Runell Gess, MD;  Location: [facility]  INVASIVE CV LAB;  Service: Cardiovascular;  Laterality: Left;  Popliteal   . RIGHT HEART CATHETERIZATION N/A 09/08/2012   Procedure: RIGHT HEART CATH;  Surgeon: Dolores Patty, MD;  Location: [facility] CATH LAB;  Service: Cardiovascular;  Laterality: N/A;    Prior to Admission medications   Medication Sig Start Date End Date Taking? Authorizing Provider  aspirin EC 81 MG tablet Take 81 mg by mouth daily.    Yes [provider]  carvedilol (COREG) 12.5 MG tablet Take 6.25 mg by mouth 2 (two) times daily.   Yes [provider]  clopidogrel (PLAVIX) 75 MG tablet TAKE 1 TABLET BY MOUTH ONCE DAILY 01/12/18  Yes [name], MD  furosemide (LASIX) 20 MG tablet Take  20 mg by mouth daily as needed for fluid.    Yes [provider]  glipiZIDE (GLUCOTROL) 10 MG tablet Take 5 mg by mouth 2 (two) times daily before a meal.  09/01/17  Yes [provider]  HYDROcodone-acetaminophen (NORCO/VICODIN) 5-325 MG tablet 1 tabs PO q8 hours prn pain Patient taking differently: Take 1 tablet by mouth every 8 (eight) hours as needed for moderate pain.  02/03/18  Yes Samuel Jester, DO  hydrOXYzine (ATARAX/VISTARIL) 25 MG tablet Take 25 mg by mouth 3 (three) times daily as needed for itching.   Yes [provider]  ibuprofen (ADVIL,MOTRIN) 200 MG tablet Take 400 mg by mouth daily as needed for headache or moderate pain.   Yes [provider]  insulin aspart protamine - aspart (NOVOLOG MIX 70/30 FLEXPEN) (70-30) 100 UNIT/ML FlexPen Inject 0.18 mLs (18 Units total) into the skin 2 (two) times daily before a meal. Patient taking differently: Inject 15 Units into the skin 2 (two) times daily before a meal.  09/09/17  Yes Carlus Pavlov, MD  levothyroxine (SYNTHROID, LEVOTHROID) 50 MCG tablet TAKE 1 TABLET BY MOUTH ONCE DAILY 09/01/17  Yes Carlus Pavlov, MD  lisinopril (PRINIVIL,ZESTRIL) 5 MG tablet Take 1 tablet (5 mg total) by mouth 2 (two) times daily. 01/16/16  Yes [name], MD  rosuvastatin (CRESTOR) 20 MG tablet TAKE 1 TABLET BY MOUTH ONCE DAILY Patient taking differently: TAKE 1 TABLET BY MOUTH ONCE at bedtime 01/20/18  Yes [name], MD  Vitamin D, Ergocalciferol, (DRISDOL) 50000 units CAPS capsule Take 1 capsule by mouth every 7 (seven) days. (MONDAYS) 12/10/16  Yes [provider]  FREESTYLE LITE test strip USE 1 STRIP TO CHECK GLUCOSE TWICE DAILY 02/09/18   [name], MD    Current Facility-Administered Medications  Medication Dose Route Frequency Provider Last Rate Last Dose  . acetaminophen (TYLENOL) tablet 650 mg  650 mg Oral Q6H PRN Calvert Cantor, MD   650 mg at 02/09/18 1952   Or  . acetaminophen  (TYLENOL) suppository 650 mg  650 mg Rectal Q6H PRN Calvert Cantor, MD      . aspirin EC tablet 81 mg  81 mg Oral Daily Rizwan, Saima, MD      . carvedilol (COREG) tablet 6.25 mg  6.25 mg Oral BID Rizwan, Ladell Heads, MD      . cefTRIAXone (ROCEPHIN) 2 g in sodium chloride 0.9 % 100 mL IVPB  2 g Intravenous Q24H Linwood Dibbles, MD   Stopped at 02/09/18 1315  . clopidogrel (PLAVIX) tablet 75 mg  75 mg Oral Daily Rizwan, Saima, MD      . heparin injection 5,000 Units  5,000 Units Subcutaneous Q8H Rizwan, Saima, MD      . insulin aspart (  novoLOG) injection 0-5 Units  0-5 Units Subcutaneous QHS Rizwan, Saima, MD      . insulin aspart (novoLOG) injection 0-9 Units  0-9 Units Subcutaneous TID WC Rizwan, Saima, MD      . levothyroxine (SYNTHROID, LEVOTHROID) tablet 50 mcg  50 mcg Oral Daily Rizwan, Ladell Heads, MD      . ondansetron (ZOFRAN) tablet 4 mg  4 mg Oral Q6H PRN Calvert Cantor, MD       Or  . ondansetron (ZOFRAN) injection 4 mg  4 mg Intravenous Q6H PRN Rizwan, Saima, MD      . sodium bicarbonate 150 mEq in sterile water 1,000 mL infusion   Intravenous Continuous Elvis Coil, MD       Current Outpatient Medications  Medication Sig Dispense Refill  . aspirin EC 81 MG tablet Take 81 mg by mouth daily.     . carvedilol (COREG) 12.5 MG tablet Take 6.25 mg by mouth 2 (two) times daily.    . clopidogrel (PLAVIX) 75 MG tablet TAKE 1 TABLET BY MOUTH ONCE DAILY 90 tablet 0  . furosemide (LASIX) 20 MG tablet Take 20 mg by mouth daily as needed for fluid.     Marland Kitchen glipiZIDE (GLUCOTROL) 10 MG tablet Take 5 mg by mouth 2 (two) times daily before a meal.     . HYDROcodone-acetaminophen (NORCO/VICODIN) 5-325 MG tablet 1 tabs PO q8 hours prn pain (Patient taking differently: Take 1 tablet by mouth every 8 (eight) hours as needed for moderate pain. ) 10 tablet 0  . hydrOXYzine (ATARAX/VISTARIL) 25 MG tablet Take 25 mg by mouth 3 (three) times daily as needed for itching.    Marland Kitchen ibuprofen (ADVIL,MOTRIN) 200 MG tablet Take 400 mg  by mouth daily as needed for headache or moderate pain.    Marland Kitchen insulin aspart protamine - aspart (NOVOLOG MIX 70/30 FLEXPEN) (70-30) 100 UNIT/ML FlexPen Inject 0.18 mLs (18 Units total) into the skin 2 (two) times daily before a meal. (Patient taking differently: Inject 15 Units into the skin 2 (two) times daily before a meal. ) 15 mL 5  . levothyroxine (SYNTHROID, LEVOTHROID) 50 MCG tablet TAKE 1 TABLET BY MOUTH ONCE DAILY 90 tablet 1  . lisinopril (PRINIVIL,ZESTRIL) 5 MG tablet Take 1 tablet (5 mg total) by mouth 2 (two) times daily. 180 tablet 3  . rosuvastatin (CRESTOR) 20 MG tablet TAKE 1 TABLET BY MOUTH ONCE DAILY (Patient taking differently: TAKE 1 TABLET BY MOUTH ONCE at bedtime) 90 tablet 0  . Vitamin D, Ergocalciferol, (DRISDOL) 50000 units CAPS capsule Take 1 capsule by mouth every 7 (seven) days. (MONDAYS)    . FREESTYLE LITE test strip USE 1 STRIP TO CHECK GLUCOSE TWICE DAILY 100 each 7    Allergies as of 02/09/2018 - Review Complete 02/09/2018  Allergen Reaction Noted  . Statins Other (See Comments) 11/29/2010  . Metformin and related  01/03/2016  . Vytorin [ezetimibe-simvastatin] Other (See Comments) 12/03/2013    Family History  Problem Relation Age of Onset  . Diabetes Mother   . Diabetes Other   . Hypertension Other   . Uterine cancer Other   . Heart attack Brother     Social History   Socioeconomic History  . Marital status: Divorced    Spouse name: Not on file  . Number of children: 4  . Years of education: Not on file  . Highest education level: Not on file  Occupational History  . Occupation: Retired Designer, television/film set  . Financial resource strain: Not  on file  . Food insecurity:    Worry: Not on file    Inability: Not on file  . Transportation needs:    Medical: Not on file    Non-medical: Not on file  Tobacco Use  . Smoking status: Former Games developer  . Smokeless tobacco: Former Engineer, water and Sexual Activity  . Alcohol use: No  . Drug use: No   . Sexual activity: Not Currently  Lifestyle  . Physical activity:    Days per week: Not on file    Minutes per session: Not on file  . Stress: Not on file  Relationships  . Social connections:    Talks on phone: Not on file    Gets together: Not on file    Attends religious service: Not on file    Active member of club or organization: Not on file    Attends meetings of clubs or organizations: Not on file    Relationship status: Not on file  . Intimate partner violence:    Fear of current or ex partner: Not on file    Emotionally abused: Not on file    Physically abused: Not on file    Forced sexual activity: Not on file  Other Topics Concern  . Not on file  Social History Narrative   Widowed.  Lives with her son.  Normally able to ambulate without assistance.  Quit smoking as a teenager.               Review of Systems: Negative review  Patient is awake alert and orientated   Physical Exam: Vital signs in last 24 hours: Temp:  [98 F (36.7 C)] 98 F (36.7 C) (07/01 1149) Pulse Rate:  [77-91] 91 (07/01 1930) Resp:  [10-30] 16 (07/01 1830) BP: (75-134)/(43-59) 134/59 (07/01 1930) SpO2:  [89 %-98 %] 96 % (07/01 1930) Weight:  [143 lb (64.9 kg)] 143 lb (64.9 kg) (07/01 1144)    General:    Elderly lady  Dry skin with wrinkling  Head:  Normocephalic and atraumatic. Eyes:  Sclera clear, no icterus.   Conjunctiva pink. Ears:  Normal auditory acuity. Nose:  No deformity, discharge,  or lesions. Mouth:  No deformity or lesions,   Mucus membrane and tongue appeared fissured and dry  Neck:  Supple; no masses or thyromegaly. JVP not elevated Lungs:  Clear throughout to auscultation.   No wheezes, crackles, or rhonchi. No acute distress. Heart:  Regular rate and rhythm; no murmurs, clicks, rubs,  or gallops. Abdomen:  Soft, nontender and nondistended. No masses, hepatosplenomegaly or hernias noted. Normal bowel sounds, without guarding, and without rebound.   Msk:   Symmetrical without gross deformities. Normal posture. Pulses:  No carotid, renal, femoral bruits. DP and PT symmetrical and equal Extremities:  Without clubbing or edema. Neurologic:  Alert and  oriented x4;  grossly normal neurologically. Skin:  Intact without significant lesions or rashes.    Intake/Output from previous day: No intake/output data recorded. Intake/Output this shift: No intake/output data recorded.  Lab Results: Recent Labs    02/09/18 1147  WBC 14.0*  HGB 13.8  HCT 42.4  PLT PLATELET CLUMPS NOTED ON SMEAR, UNABLE TO ESTIMATE   BMET Recent Labs    02/09/18 1147  NA 140  K 5.3*  CL 102  CO2 11*  GLUCOSE 282*  BUN 91*  CREATININE 8.31*  CALCIUM 8.5*   LFT Recent Labs    02/09/18 1147  PROT 8.6*  ALBUMIN 3.6  AST 43*  ALT 27  ALKPHOS 84  BILITOT 1.0   PT/INR Recent Labs    02/09/18 1147  LABPROT 14.6  INR 1.15   Hepatitis Panel No results for input(s): HEPBSAG, HCVAB, HEPAIGM, HEPBIGM in the last 72 hours.  Studies/Results: Dg Chest Port 1 View  Result Date: 02/09/2018 CLINICAL DATA:  Sepsis. EXAM: PORTABLE CHEST 1 VIEW COMPARISON:  Radiographs of Jan 10, 2016. FINDINGS: The heart size and mediastinal contours are within normal limits. Both lungs are clear. Hypoinflation of the lungs is noted. Left-sided pacemaker is unchanged in position. No pneumothorax or pleural effusion is noted. The visualized skeletal structures are unremarkable. IMPRESSION: Hypoinflation of the lungs. No acute cardiopulmonary abnormality seen. Electronically Signed   By: Lupita Raider, M.D.   On: 02/09/2018 12:36    Assessment/Plan:  1. Acute on chronic renal failure   Creatinine has increased to 9 with dehydration and dry mucus membranes  She may have had an acute UTI and the results of the urine studies are pending   There is nothing on the bladder scan although will proceed to renal ultrasound  We shall evaluate the urinalysis as we do not have the results at  this time. My working diagnosis is acute prerenal azotemia and will support with IV fluids at this time. We shall need to follow renal function closely and avoid nephrotoxins and any iv contrast  Avoid nsaids and no ace inhibitors or lasix at this time. 2. Volume  Continue rehydration  Patient will need close follow up due to history of congestive heart failure  At the moment she appears dry   The right thing to do is to hold both lasix and ace Inhibitor 3. Metabolic acidosis will correct with IV bicarbonate  125 cc / hr 4. Hyperkalemia  This should resolve with treatment of metabolic acidosis 5. CHF  EF 25 %  Caution with rehydration 6. Diabetes followed by primary team   LOS: 0 Kayron Hicklin W @TODAY @8 :02 PM

## 2018-02-09 NOTE — H&P (Addendum)
History and Physical    Araiyah Cumpton ZOX:[id number] DOB: 1933-08-20 DOA: 02/09/2018    PCP: [name], MD  Patient coming from: home  Chief Complaint: weakness and malodorous urine  HPI: [name] [name] is a 84 y.o. female with medical history of chronic diastolic and systolic CHF, CAD, HTN, HLD, IDDM2 who comes in for generalized weakness. Family is not present and history is obtained from ED physician. The patient is found to have a UTI and AKI. She fell recently and was seen in the ED on 6/25 has back fractures and has been receiving NSAIDs.    ED Course:  K 5.3, BUN 91, Cr 8.31, Lactic acid 2.38, WBC 14, UA +for WBC but rare bacteria.   Review of Systems:  All other systems reviewed and apart from HPI, are negative.  Past Medical History:  Diagnosis Date  . Chronic combined systolic and diastolic CHF, NYHA class 3 (HCC)    a. 03/2013 Echo: EF 25-30%, diff HK, sev antsept HK, mild MR.  . Congestive dilated cardiomyopathy (HCC)    a. 03/2013 Echo: EF 25-30%;  b. 09/2013 s/p SJM 3242 CRT-P.  Marland Kitchen Coronary artery disease    a. 08/2012 Cath: LM nl, LAD 53m, LCX nondom, mild-mod nonobs dzs mid, OM1 ok, OM2 90/90p, RCA 40, EF 25-30%-->Med Rx.  . GERD (gastroesophageal reflux disease)   . High cholesterol   . Hypertension   . Hypothyroidism   . Left bundle branch block   . Osteoarthritis   . Stroke (HCC)   . Type II diabetes mellitus (HCC)     Past Surgical History:  Procedure Laterality Date  . ABDOMINAL HYSTERECTOMY  1980  . BI-VENTRICULAR PACEMAKER INSERTION N/A 10/01/2013   Procedure: BI-VENTRICULAR PACEMAKER INSERTION (CRT-P);  Surgeon: Duke Salvia, MD;  Location: [facility] CATH LAB;  Service: Cardiovascular;  Laterality: N/A;  . BI-VENTRICULAR PACEMAKER INSERTION (CRT-P)     a. 09/2013 s/p SJM 3242 CRT-P.  . Bilateral cateract surgery    . BMD  2006  . IR GENERIC HISTORICAL  10/18/2016   IR ANGIO INTRA EXTRACRAN SEL COM CAROTID INNOMINATE BILAT MOD SED 10/18/2016 Julieanne Cotton,  MD [facility]-INTERV RAD  . IR GENERIC HISTORICAL  10/18/2016   IR ANGIO VERTEBRAL SEL SUBCLAVIAN INNOMINATE BILAT MOD SED 10/18/2016 Julieanne Cotton, MD [facility]  . ORIF TIBIA PLATEAU Right 12/04/2013   Procedure: OPEN REDUCTION INTERNAL FIXATION (ORIF) TIBIAL PLATEAU;  Surgeon: Verlee Rossetti, MD;  Location: [facility] ORS;  Service: Orthopedics;  Laterality: Right;  . PERIPHERAL VASCULAR CATHETERIZATION N/A 06/17/2016   Procedure: Lower Extremity Angiography;  Surgeon: Runell Gess, MD;  Location: [facility] INVASIVE CV LAB;  Service: Cardiovascular;  Laterality: N/A;  . PERIPHERAL VASCULAR CATHETERIZATION Left 06/17/2016   Procedure: Peripheral Vascular Atherectomy;  Surgeon: Runell Gess, MD;  Location: [facility] INVASIVE CV LAB;  Service: Cardiovascular;  Laterality: Left;  Popliteal   . RIGHT HEART CATHETERIZATION N/A 09/08/2012   Procedure: RIGHT HEART CATH;  Surgeon: Dolores Patty, MD;  Location: [facility] CATH LAB;  Service: Cardiovascular;  Laterality: N/A;    Social History:   reports that she has quit smoking. She has quit using smokeless tobacco. She reports that she does not drink alcohol or use drugs.  Allergies  Allergen Reactions  . Statins Other (See Comments)    Leg cramping  . Metformin And Related     Pt stopped it due to diarrhea  . Vytorin [Ezetimibe-Simvastatin] Other (See Comments)    Leg cramps    Family  History  Problem Relation Age of Onset  . Diabetes Mother   . Diabetes Other   . Hypertension Other   . Uterine cancer Other   . Heart attack Brother      Prior to Admission medications   Medication Sig Start Date End Date Taking? Authorizing Provider  aspirin EC 81 MG tablet Take 81 mg by mouth daily.    Yes [provider]  carvedilol (COREG) 12.5 MG tablet Take 6.25 mg by mouth 2 (two) times daily.   Yes [provider]  clopidogrel (PLAVIX) 75 MG tablet TAKE 1 TABLET BY MOUTH ONCE DAILY 01/12/18  Yes [name], MD  furosemide (LASIX) 20 MG tablet  Take 20 mg by mouth daily as needed for fluid.    Yes [provider]  glipiZIDE (GLUCOTROL) 10 MG tablet Take 5 mg by mouth 2 (two) times daily before a meal.  09/01/17  Yes [provider]  HYDROcodone-acetaminophen (NORCO/VICODIN) 5-325 MG tablet 1 tabs PO q8 hours prn pain Patient taking differently: Take 1 tablet by mouth every 8 (eight) hours as needed for moderate pain.  02/03/18  Yes Samuel Jester, DO  hydrOXYzine (ATARAX/VISTARIL) 25 MG tablet Take 25 mg by mouth 3 (three) times daily as needed for itching.   Yes [provider]  ibuprofen (ADVIL,MOTRIN) 200 MG tablet Take 400 mg by mouth daily as needed for headache or moderate pain.   Yes [provider]  insulin aspart protamine - aspart (NOVOLOG MIX 70/30 FLEXPEN) (70-30) 100 UNIT/ML FlexPen Inject 0.18 mLs (18 Units total) into the skin 2 (two) times daily before a meal. Patient taking differently: Inject 15 Units into the skin 2 (two) times daily before a meal.  09/09/17  Yes Carlus Pavlov, MD  levothyroxine (SYNTHROID, LEVOTHROID) 50 MCG tablet TAKE 1 TABLET BY MOUTH ONCE DAILY 09/01/17  Yes Carlus Pavlov, MD  lisinopril (PRINIVIL,ZESTRIL) 5 MG tablet Take 1 tablet (5 mg total) by mouth 2 (two) times daily. 01/16/16  Yes [name], MD  rosuvastatin (CRESTOR) 20 MG tablet TAKE 1 TABLET BY MOUTH ONCE DAILY Patient taking differently: TAKE 1 TABLET BY MOUTH ONCE at bedtime 01/20/18  Yes [name], MD  Vitamin D, Ergocalciferol, (DRISDOL) 50000 units CAPS capsule Take 1 capsule by mouth every 7 (seven) days. (MONDAYS) 12/10/16  Yes [provider]  FREESTYLE LITE test strip USE 1 STRIP TO CHECK GLUCOSE TWICE DAILY 02/09/18   [name], MD    Physical Exam: Wt Readings from Last 3 Encounters:  02/09/18 64.9 kg (143 lb)  02/03/18 64.9 kg (143 lb)  01/08/18 64.9 kg (143 lb)   Vitals:   02/09/18 1300 02/09/18 1330 02/09/18 1400 02/09/18 1631  BP: (!) 111/51 (!) 123/52 (!)  121/49 (!) 115/57  Pulse: 80 83 83 88  Resp: 10 15 10 14   Temp:      TempSrc:      SpO2: 95% 96% 94% 98%  Weight:      Height:          Constitutional: NAD, calm, comfortable Eyes: PERTLA, lids and conjunctivae normal ENMT: Mucous membranes are moist. Posterior pharynx clear of any exudate or lesions. Normal dentition.  Neck: normal, supple, no masses, no thyromegaly Respiratory: clear to auscultation bilaterally, no wheezing, no crackles. Normal respiratory effort. No accessory muscle use.  Cardiovascular: S1 & S2 heard, regular rate and rhythm, no murmurs / rubs / gallops. No extremity edema. 2+ pedal pulses. No carotid bruits.  Abdomen: No  distension, no tenderness, no masses palpated. No hepatosplenomegaly. Bowel sounds normal.  Musculoskeletal: no clubbing / cyanosis. No joint deformity upper and lower extremities. Good ROM, no contractures. Normal muscle tone.  Skin: no rashes, lesions, ulcers. No induration Neurologic: CN 2-12 grossly intact. Sensation intact, DTR normal. Strength 5/5 in all 4 limbs.  Psychiatric: Confused to time and place and no insight    Labs on Admission: I have personally reviewed following labs and imaging studies  CBC: Recent Labs  Lab 02/09/18 1147  WBC 14.0*  NEUTROABS 11.6  HGB 13.8  HCT 42.4  MCV 83.1  PLT PLATELET CLUMPS NOTED ON SMEAR, UNABLE TO ESTIMATE   Basic Metabolic Panel: Recent Labs  Lab 02/09/18 1147  NA 140  K 5.3*  CL 102  CO2 11*  GLUCOSE 282*  BUN 91*  CREATININE 8.31*  CALCIUM 8.5*   GFR: Estimated Creatinine Clearance: 4.8 mL/min (A) (by C-G formula based on SCr of 8.31 mg/dL (H)). Liver Function Tests: Recent Labs  Lab 02/09/18 1147  AST 43*  ALT 27  ALKPHOS 84  BILITOT 1.0  PROT 8.6*  ALBUMIN 3.6   No results for input(s): LIPASE, AMYLASE in the last 168 hours. No results for input(s): AMMONIA in the last 168 hours. Coagulation Profile: Recent Labs  Lab 02/09/18 1147  INR 1.15   Cardiac  Enzymes: No results for input(s): CKTOTAL, CKMB, CKMBINDEX, TROPONINI in the last 168 hours. BNP (last 3 results) No results for input(s): PROBNP in the last 8760 hours. HbA1C: No results for input(s): HGBA1C in the last 72 hours. CBG: No results for input(s): GLUCAP in the last 168 hours. Lipid Profile: No results for input(s): CHOL, HDL, LDLCALC, TRIG, CHOLHDL, LDLDIRECT in the last 72 hours. Thyroid Function Tests: No results for input(s): TSH, T4TOTAL, FREET4, T3FREE, THYROIDAB in the last 72 hours. Anemia Panel: No results for input(s): VITAMINB12, FOLATE, FERRITIN, TIBC, IRON, RETICCTPCT in the last 72 hours. Urine analysis:    Component Value Date/Time   COLORURINE YELLOW 02/09/2018 1559   APPEARANCEUR CLOUDY (A) 02/09/2018 1559   LABSPEC 1.015 02/09/2018 1559   PHURINE 5.0 02/09/2018 1559   GLUCOSEU NEGATIVE 02/09/2018 1559   GLUCOSEU NEGATIVE 01/10/2016 1416   HGBUR LARGE (A) 02/09/2018 1559   BILIRUBINUR NEGATIVE 02/09/2018 1559   KETONESUR 5 (A) 02/09/2018 1559   PROTEINUR 100 (A) 02/09/2018 1559   UROBILINOGEN 0.2 01/10/2016 1416   NITRITE NEGATIVE 02/09/2018 1559   LEUKOCYTESUR NEGATIVE 02/09/2018 1559   Sepsis Labs: @LABRCNTIP (procalcitonin:4,lacticidven:4) )No results found for this or any previous visit (from the past 240 hour(s)).   Radiological Exams on Admission: Dg Chest Port 1 View  Result Date: 02/09/2018 CLINICAL DATA:  Sepsis. EXAM: PORTABLE CHEST 1 VIEW COMPARISON:  Radiographs of Jan 10, 2016. FINDINGS: The heart size and mediastinal contours are within normal limits. Both lungs are clear. Hypoinflation of the lungs is noted. Left-sided pacemaker is unchanged in position. No pneumothorax or pleural effusion is noted. The visualized skeletal structures are unremarkable. IMPRESSION: Hypoinflation of the lungs. No acute cardiopulmonary abnormality seen. Electronically Signed   By: Lupita Raider, M.D.   On: 02/09/2018 12:36        Assessment/Plan Principal Problem:   AKI (acute kidney injury) with mild hyperkalemia and metabolic acidosis CKD 3 - ATN and prerenal likely in setting of Lasix, Lisinopril and NSAID use - last Cr 1.40 in 3/18 - has been given 2 LNS - start Bicarb infusion - place foley,   follow I and O carefully -  admit to [facility] in case she needs dialysis - have notified renal team of consult  Active Problems: Recent fracture of T12 (50% loss of height) , L1, L2 (10%) - cont TLSO brace - hold NSAIDs - PT eval  Acute encephalopathy - likely due to uremia- follow  ? UTI - only rare bacteria on UA- f/u culture- has received a dose of Rocephin- I will no resume this    Hypothyroidism - Synthroid 50 mg    Hyperlipidemia with target LDL less than 70 - Crestor    Congestive dilated cardiomyopathy  - hold Lasix and Lisinopril - last ECHO from 01/12/16 showed EF had improved to 55-60% and diastolic function was grade 1    HTN (hypertension) - cont coreg    Type 2 diabetes mellitus with diabetic neuropathy, with long-term current use of insulin    - hold 70/30 and Glucotorl and place on SSI     DVT prophylaxis: Heparin Code Status: Full code  Family Communication: son  Disposition Plan: [facility] telemery  Consults called: Dr Hyman Hopes, Nephrology  Admission status: inpatient    Calvert Cantor MD [facility] Pager: [email or web address] Password [email or web address] [phone], please contact night-coverage   02/09/2018, 5:14 PM

## 2018-02-09 NOTE — ED Triage Notes (Addendum)
Patient c/o right lower back pain. patient had a previous fall and is wearing an Aspen back brace. patient's son reports that there is a foul odor to her urine and increased confusion.

## 2018-02-09 NOTE — ED Notes (Signed)
[place] applied to patient at this time.

## 2018-02-09 NOTE — ED Notes (Signed)
[facility] has been contacted regarding patient transport.  

## 2018-02-09 NOTE — ED Provider Notes (Addendum)
[facility]-EMERGENCY DEPT Provider Note   CSN: [id number] Arrival date & time: 02/09/18  1113     History   Chief Complaint Chief Complaint  Patient presents with  . Flank Pain  . foul urine odor  . Weakness    HPI [name] [name] is a 84 y.o. female.  HPI Patient presents to the emergency room for evaluation of confusion and possible urinary tract infection.  Patient was recently the hospital on June 25.  At that time the patient had slipped and fallen.  Patient ended up getting x-rays that demonstrated compression fractures.  Patient was discharged home with a brace as well as pain medications.  Patient has continued to have back pain since her recent visit.  Family states however recently she has not been wanting to bathe.  They have noticed a strong odor to her urine.  This morning she was very weak and confused.  They brought her into the emergency room to be evaluated.  No known fevers.  No vomiting. Past Medical History:  Diagnosis Date  . Chronic combined systolic and diastolic CHF, NYHA class 3 (HCC)    a. 03/2013 Echo: EF 25-30%, diff HK, sev antsept HK, mild MR.  . Congestive dilated cardiomyopathy (HCC)    a. 03/2013 Echo: EF 25-30%;  b. 09/2013 s/p SJM 3242 CRT-P.  Marland Kitchen Coronary artery disease    a. 08/2012 Cath: LM nl, LAD 55m, LCX nondom, mild-mod nonobs dzs mid, OM1 ok, OM2 90/90p, RCA 40, EF 25-30%-->Med Rx.  . GERD (gastroesophageal reflux disease)   . High cholesterol   . Hypertension   . Hypothyroidism   . Left bundle branch block   . Osteoarthritis   . Stroke (HCC)   . Type II diabetes mellitus [facility])     Patient Active Problem List   Diagnosis Date Noted  . Encounter for post-traumatic wound check 12/16/2016  . Critical lower limb ischemia 06/17/2016  . Diabetic neuropathy, painful (HCC) 04/24/2016  . Left carotid artery stenosis   . Hypertriglyceridemia, essential 01/10/2016  . Cerebral infarction due to thrombosis of right posterior  cerebral artery (HCC) 01/10/2016  . Type 2 diabetes mellitus with diabetic neuropathy, with long-term current use of insulin (HCC) 10/06/2015  . Deficiency anemia 03/10/2014  . Osteoporosis, unspecified 03/10/2014  . Left bundle branch block 07/01/2013  . Coronary atherosclerosis of native coronary artery 02/16/2013  . Chronic systolic heart failure (HCC) 08/16/2012  . Congestive dilated cardiomyopathy (HCC) 08/02/2012  . HTN (hypertension) 08/02/2012  . Familial tremor 11/29/2010  . VITAMIN D DEFICIENCY 04/04/2010  . Osteoarthrosis, unspecified whether generalized or localized, unspecified site 04/04/2010  . Hyperlipidemia with target LDL less than 70 01/19/2007  . Hypothyroidism 01/14/2007    Past Surgical History:  Procedure Laterality Date  . ABDOMINAL HYSTERECTOMY  1980  . BI-VENTRICULAR PACEMAKER INSERTION N/A 10/01/2013   Procedure: BI-VENTRICULAR PACEMAKER INSERTION (CRT-P);  Surgeon: Duke Salvia, MD;  Location: [facility] CATH LAB;  Service: Cardiovascular;  Laterality: N/A;  . BI-VENTRICULAR PACEMAKER INSERTION (CRT-P)     a. 09/2013 s/p SJM 3242 CRT-P.  . Bilateral cateract surgery    . BMD  2006  . IR GENERIC HISTORICAL  10/18/2016   IR ANGIO INTRA EXTRACRAN SEL COM CAROTID INNOMINATE BILAT MOD SED 10/18/2016 Julieanne Cotton, MD [facility]-INTERV RAD  . IR GENERIC HISTORICAL  10/18/2016   IR ANGIO VERTEBRAL SEL SUBCLAVIAN INNOMINATE BILAT MOD SED 10/18/2016 Julieanne Cotton, MD [facility]-INTER[facility]  . ORIF TIBIA PLATEAU Right 12/04/2013   Procedure: OPEN  REDUCTION INTERNAL FIXATION (ORIF) TIBIAL PLATEAU;  Surgeon: Verlee Rossetti, MD;  Location: [facility] ORS;  Service: Orthopedics;  Laterality: Right;  . PERIPHERAL VASCULAR CATHETERIZATION N/A 06/17/2016   Procedure: Lower Extremity Angiography;  Surgeon: Runell Gess, MD;  Location: [facility] INVASIVE CV LAB;  Service: Cardiovascular;  Laterality: N/A;  . PERIPHERAL VASCULAR CATHETERIZATION Left 06/17/2016   Procedure: Peripheral Vascular Atherectomy;   Surgeon: Runell Gess, MD;  Location: [facility] INVASIVE CV LAB;  Service: Cardiovascular;  Laterality: Left;  Popliteal   . RIGHT HEART CATHETERIZATION N/A 09/08/2012   Procedure: RIGHT HEART CATH;  Surgeon: Dolores Patty, MD;  Location: [facility] CATH LAB;  Service: Cardiovascular;  Laterality: N/A;     OB History   None      Home Medications    Prior to Admission medications   Medication Sig Start Date End Date Taking? Authorizing Provider  aspirin EC 81 MG tablet Take 81 mg by mouth daily.    Yes [provider]  carvedilol (COREG) 12.5 MG tablet Take 6.25 mg by mouth 2 (two) times daily.   Yes [provider]  clopidogrel (PLAVIX) 75 MG tablet TAKE 1 TABLET BY MOUTH ONCE DAILY 01/12/18  Yes [name], MD  furosemide (LASIX) 20 MG tablet Take 20 mg by mouth daily as needed for fluid.    Yes [provider]  glipiZIDE (GLUCOTROL) 10 MG tablet Take 5 mg by mouth 2 (two) times daily before a meal.  09/01/17  Yes [provider]  HYDROcodone-acetaminophen (NORCO/VICODIN) 5-325 MG tablet 1 tabs PO q8 hours prn pain Patient taking differently: Take 1 tablet by mouth every 8 (eight) hours as needed for moderate pain.  02/03/18  Yes Samuel Jester, DO  hydrOXYzine (ATARAX/VISTARIL) 25 MG tablet Take 25 mg by mouth 3 (three) times daily as needed for itching.   Yes [provider]  ibuprofen (ADVIL,MOTRIN) 200 MG tablet Take 400 mg by mouth daily as needed for headache or moderate pain.   Yes [provider]  insulin aspart protamine - aspart (NOVOLOG MIX 70/30 FLEXPEN) (70-30) 100 UNIT/ML FlexPen Inject 0.18 mLs (18 Units total) into the skin 2 (two) times daily before a meal. Patient taking differently: Inject 15 Units into the skin 2 (two) times daily before a meal.  09/09/17  Yes Carlus Pavlov, MD  levothyroxine (SYNTHROID, LEVOTHROID) 50 MCG tablet TAKE 1 TABLET BY MOUTH ONCE DAILY 09/01/17  Yes Carlus Pavlov, MD  lisinopril  (PRINIVIL,ZESTRIL) 5 MG tablet Take 1 tablet (5 mg total) by mouth 2 (two) times daily. 01/16/16  Yes [name], MD  rosuvastatin (CRESTOR) 20 MG tablet TAKE 1 TABLET BY MOUTH ONCE DAILY Patient taking differently: TAKE 1 TABLET BY MOUTH ONCE at bedtime 01/20/18  Yes [name], MD  Vitamin D, Ergocalciferol, (DRISDOL) 50000 units CAPS capsule Take 1 capsule by mouth every 7 (seven) days. (MONDAYS) 12/10/16  Yes [provider]  FREESTYLE LITE test strip USE 1 STRIP TO CHECK GLUCOSE TWICE DAILY 02/09/18   [name], MD    Family History Family History  Problem Relation Age of Onset  . Diabetes Mother   . Diabetes Other   . Hypertension Other   . Uterine cancer Other   . Heart attack Brother     Social History Social History   Tobacco Use  . Smoking status: Former Games developer  . Smokeless tobacco: Former Engineer, water Use Topics  . Alcohol use: No  . Drug use: No  Allergies   Statins; Metformin and related; and Vytorin [ezetimibe-simvastatin]   Review of Systems Review of Systems  Constitutional: Negative for fever.  Respiratory: Negative for shortness of breath.   Cardiovascular: Negative for chest pain.  Gastrointestinal: Negative for abdominal pain.  Genitourinary: Positive for dysuria.  All other systems reviewed and are negative.    Physical Exam Updated Vital Signs BP (!) 115/57 (BP Location: Right Arm)   Pulse 88   Temp 98 F (36.7 C) (Oral)   Resp 14   Ht 1.651 m (5\' 5" )   Wt 64.9 kg (143 lb)   SpO2 98%   BMI 23.80 kg/m   Physical Exam  Constitutional: She appears listless.  Elderly, frail, listless  HENT:  Head: Normocephalic and atraumatic.  Right Ear: External ear normal.  Left Ear: External ear normal.  Eyes: Conjunctivae are normal. Right eye exhibits no discharge. Left eye exhibits no discharge. No scleral icterus.  Neck: Neck supple. No tracheal deviation present.  Cardiovascular: Normal rate, regular rhythm and intact  distal pulses.  Pulmonary/Chest: Effort normal and breath sounds normal. No stridor. No respiratory distress. She has no wheezes. She has no rales.  Abdominal: Soft. Bowel sounds are normal. She exhibits no distension. There is no rebound and no guarding.  Musculoskeletal: She exhibits no edema.       Thoracic back: She exhibits tenderness.       Lumbar back: She exhibits tenderness.  Neurological: She appears listless. No cranial nerve deficit (no facial droop, extraocular movements intact, no slurred speech) or sensory deficit. She exhibits normal muscle tone. She displays no seizure activity. Coordination normal. GCS eye subscore is 4. GCS verbal subscore is 4. GCS motor subscore is 6.  Generalized weakness, patient will move all 4 extremities when I asked her to however she just wiggles them and is unable to lift her arms or legs off the bed  Skin: Skin is warm and dry. No rash noted. She is not diaphoretic.  Psychiatric: She has a normal mood and affect.  Nursing note and vitals reviewed.    ED Treatments / Results  Labs (all labs ordered are listed, but only abnormal results are displayed) Labs Reviewed  COMPREHENSIVE METABOLIC PANEL - Abnormal; Notable for the following components:      Result Value   Potassium 5.3 (*)    CO2 11 (*)    Glucose, Bld 282 (*)    BUN 91 (*)    Creatinine, Ser 8.31 (*)    Calcium 8.5 (*)    Total Protein 8.6 (*)    AST 43 (*)    GFR calc non Af Amer 4 (*)    GFR calc Af Amer 5 (*)    Anion gap 27 (*)    All other components within normal limits  CBC WITH DIFFERENTIAL/PLATELET - Abnormal; Notable for the following components:   WBC 14.0 (*)    RDW 16.1 (*)    All other components within normal limits  URINALYSIS, ROUTINE W REFLEX MICROSCOPIC - Abnormal; Notable for the following components:   APPearance CLOUDY (*)    Hgb urine dipstick LARGE (*)    Ketones, ur 5 (*)    Protein, ur 100 (*)    Bacteria, UA RARE (*)    Non Squamous Epithelial  0-5 (*)    All other components within normal limits  I-STAT CG4 LACTIC ACID, ED - Abnormal; Notable for the following components:   Lactic Acid, Venous 2.38 (*)    All other  components within normal limits  CULTURE, BLOOD (ROUTINE X 2)  CULTURE, BLOOD (ROUTINE X 2)  URINE CULTURE  PROTIME-INR  I-STAT CG4 LACTIC ACID, ED  I-STAT CG4 LACTIC ACID, ED  I-STAT CG4 LACTIC ACID, ED    EKG EKG Interpretation  Date/Time:  Monday February 09 2018 11:46:56 EDT Ventricular Rate:  80 PR Interval:    QRS Duration: 133 QT Interval:  471 QTC Calculation: 544 R Axis:   -98 Text Interpretation:  Sinus rhythm Nonspecific IVCD with LAD Consider anterior infarct No significant change since last tracing Confirmed by Linwood Dibbles [phone]) on 02/09/2018 11:59:10 AM   Radiology Dg Chest Port 1 View  Result Date: 02/09/2018 CLINICAL DATA:  Sepsis. EXAM: PORTABLE CHEST 1 VIEW COMPARISON:  Radiographs of Jan 10, 2016. FINDINGS: The heart size and mediastinal contours are within normal limits. Both lungs are clear. Hypoinflation of the lungs is noted. Left-sided pacemaker is unchanged in position. No pneumothorax or pleural effusion is noted. The visualized skeletal structures are unremarkable. IMPRESSION: Hypoinflation of the lungs. No acute cardiopulmonary abnormality seen. Electronically Signed   By: Lupita Raider, M.D.   On: 02/09/2018 12:36    Procedures .Critical Care Performed by: Linwood Dibbles, MD Authorized by: Linwood Dibbles, MD   Critical care provider statement:    Critical care time (minutes):  35   Critical care was time spent personally by me on the following activities:  Discussions with consultants, evaluation of patient's response to treatment, examination of patient, ordering and performing treatments and interventions, ordering and review of laboratory studies, ordering and review of radiographic studies, pulse oximetry, re-evaluation of patient's condition, obtaining history from patient or  surrogate and review of old charts   (including critical care time)  Medications Ordered in ED Medications  cefTRIAXone (ROCEPHIN) 2 g in sodium chloride 0.9 % 100 mL IVPB (0 g Intravenous Stopped 02/09/18 1315)  sodium chloride 0.9 % bolus 1,000 mL (0 mLs Intravenous Stopped 02/09/18 1341)    And  sodium chloride 0.9 % bolus 1,000 mL (1,000 mLs Intravenous New Bag/Given 02/09/18 1341)     Initial Impression / Assessment and Plan / ED Course  I have reviewed the triage vital signs and the nursing notes.  Pertinent labs & imaging results that were available during my care of the patient were reviewed by me and considered in my medical decision making (see chart for details).  Clinical Course as of Feb 09 1650  Mon Feb 09, 2018  1634 Labs are notable for acute renal failure.  Patient has evidence of uremia and metabolic acidosis.  Potassium was 5.3, no significant hyperkalemia.  Blood pressure is also improved at 115/57   [JK]  1636 Bladder ultrasound without signs of urinary retention   [JK]  1636 Urinalysis is consistent with a urinary tract infection   [JK]  1637 Discussed CODE STATUS with patient.  She indicates she would not want CPR   [JK]    Clinical Course User Index [JK] Linwood Dibbles, MD    Patient presented to the emergency room for evaluation of hypotension and concerns about possible urinary tract infection.  Patient's laboratory tests are notable for acute renal failure.  I suspect this is most likely related to dehydration.  Lactic acid levels not elevated.  No signs of severe sepsis at this time with her improving blood pressure.  We will continue with IV fluid hydration.  I will consult with the medical service for admission  Final Clinical Impressions(s) / ED Diagnoses  Final diagnoses:  Acute renal failure, unspecified acute renal failure type (HCC)  Urinary tract infection without hematuria, site unspecified  Dehydration      Linwood Dibbles, MD 02/09/18 1638      Linwood Dibbles, MD 02/09/18 1651

## 2018-02-10 LAB — BASIC METABOLIC PANEL
ANION GAP: 21 — AB (ref 5–15)
BUN: 91 mg/dL — AB (ref 8–23)
CO2: 21 mmol/L — ABNORMAL LOW (ref 22–32)
Calcium: 7.2 mg/dL — ABNORMAL LOW (ref 8.9–10.3)
Chloride: 101 mmol/L (ref 98–111)
Creatinine, Ser: 6.37 mg/dL — ABNORMAL HIGH (ref 0.44–1.00)
GFR, EST AFRICAN AMERICAN: 6 mL/min — AB (ref >60)
GFR, EST NON AFRICAN AMERICAN: 5 mL/min — AB (ref >60)
Glucose, Bld: 158 mg/dL — ABNORMAL HIGH (ref 70–99)
POTASSIUM: 4 mmol/L (ref 3.5–5.1)
SODIUM: 143 mmol/L (ref 135–145)

## 2018-02-10 LAB — GLUCOSE, CAPILLARY
GLUCOSE-CAPILLARY: 147 mg/dL — AB (ref 70–99)
GLUCOSE-CAPILLARY: 178 mg/dL — AB (ref 70–99)
GLUCOSE-CAPILLARY: 82 mg/dL (ref 70–99)
Glucose-Capillary: 166 mg/dL — ABNORMAL HIGH (ref 70–99)

## 2018-02-10 LAB — CBC
HEMATOCRIT: 37.7 % (ref 36.0–46.0)
Hemoglobin: 12.3 g/dL (ref 12.0–15.0)
MCH: 26.3 pg (ref 26.0–34.0)
MCHC: 32.6 g/dL (ref 30.0–36.0)
MCV: 80.6 fL (ref 78.0–100.0)
Platelets: 168 10*3/uL (ref 150–400)
RBC: 4.68 MIL/uL (ref 3.87–5.11)
RDW: 15.7 % — AB (ref 11.5–15.5)
WBC: 12.3 10*3/uL — AB (ref 4.0–10.5)

## 2018-02-10 LAB — URINE CULTURE: Culture: NO GROWTH

## 2018-02-10 MED ORDER — MUSCLE RUB 10-15 % EX CREA
1.0000 "application " | TOPICAL_CREAM | CUTANEOUS | Status: DC | PRN
  Administered 2018-02-11 – 2018-02-14 (×4): 1 via TOPICAL
  Filled 2018-02-10 (×2): qty 85

## 2018-02-10 MED ORDER — HALOPERIDOL LACTATE 5 MG/ML IJ SOLN
1.0000 mg | Freq: Once | INTRAMUSCULAR | Status: AC
  Administered 2018-02-10: 1 mg via INTRAVENOUS
  Filled 2018-02-10: qty 1

## 2018-02-10 NOTE — Progress Notes (Signed)
S: Pt is very agitated and fearful.  "I don't feel well" O:BP 140/74 (BP Location: Right Wrist)   Pulse 75   Temp 97.6 F (36.4 C) (Oral)   Resp 12   Ht 5\' 5"  (1.651 m)   Wt 61.7 kg (136 lb)   SpO2 97%   BMI 22.63 kg/m   Intake/Output Summary (Last 24 hours) at 02/10/2018 1157 Last data filed at 02/10/2018 0906 Gross per 24 hour  Intake 2730.45 ml  Output 501 ml  Net 2229.45 ml   Intake/Output: I/O last 3 completed shifts: In: 2730.5 [I.V.:630.5; IV Piggyback:2100] Out: 501 [Urine:500; Stool:1]  Intake/Output this shift:  No intake/output data recorded. Weight change:  Gen: frail elderly AAF in mild distress CVS: no rub Resp: cta Abd: +BS, soft, NT/ND Ext: no edema  Recent Labs  Lab 02/09/18 1147 02/10/18 0942  NA 140 143  K 5.3* 4.0  CL 102 101  CO2 11* 21*  GLUCOSE 282* 158*  BUN 91* 91*  CREATININE 8.31* 6.37*  ALBUMIN 3.6  --   CALCIUM 8.5* 7.2*  AST 43*  --   ALT 27  --    Liver Function Tests: Recent Labs  Lab 02/09/18 1147  AST 43*  ALT 27  ALKPHOS 84  BILITOT 1.0  PROT 8.6*  ALBUMIN 3.6   No results for input(s): LIPASE, AMYLASE in the last 168 hours. No results for input(s): AMMONIA in the last 168 hours. CBC: Recent Labs  Lab 02/09/18 1147 02/10/18 0942  WBC 14.0* 12.3*  NEUTROABS 11.6  --   HGB 13.8 12.3  HCT 42.4 37.7  MCV 83.1 80.6  PLT PLATELET CLUMPS NOTED ON SMEAR, UNABLE TO ESTIMATE 168   Cardiac Enzymes: No results for input(s): CKTOTAL, CKMB, CKMBINDEX, TROPONINI in the last 168 hours. CBG: Recent Labs  Lab 02/09/18 2128 02/10/18 0737 02/10/18 1146  GLUCAP 234* 147* 166*    Iron Studies: No results for input(s): IRON, TIBC, TRANSFERRIN, FERRITIN in the last 72 hours. Studies/Results: US Renal  Result Date: 02/09/2018 CLINICAL DATA:  84 year old female with acute renal insufficiency. EXAM: RENAL / URINARY TRACT ULTRASOUND COMPLETE COMPARISON:  None. FINDINGS: Right Kidney: Length: 10.5 cm. Mild parenchymal atrophy.  No hydronephrosis or shadowing stone. Left Kidney: Length: 7.8 cm. The kidney is poorly visualized. There is apparent mild to moderate left renal atrophy. No hydronephrosis or large shadowing stone. Bladder: Appears normal for degree of bladder distention. IMPRESSION: Mild right and moderate left renal parenchymal atrophy. No hydronephrosis or large shadowing stone. Electronically Signed   By: Elgie Collard M.D.   On: 02/09/2018 22:22   Dg Chest Port 1 View  Result Date: 02/09/2018 CLINICAL DATA:  Sepsis. EXAM: PORTABLE CHEST 1 VIEW COMPARISON:  Radiographs of Jan 10, 2016. FINDINGS: The heart size and mediastinal contours are within normal limits. Both lungs are clear. Hypoinflation of the lungs is noted. Left-sided pacemaker is unchanged in position. No pneumothorax or pleural effusion is noted. The visualized skeletal structures are unremarkable. IMPRESSION: Hypoinflation of the lungs. No acute cardiopulmonary abnormality seen. Electronically Signed   By: Lupita Raider, M.D.   On: 02/09/2018 12:36   . aspirin EC  81 mg Oral Daily  . carvedilol  6.25 mg Oral BID  . clopidogrel  75 mg Oral Daily  . heparin  5,000 Units Subcutaneous Q8H  . insulin aspart  0-5 Units Subcutaneous QHS  . insulin aspart  0-9 Units Subcutaneous TID WC  . levothyroxine  50 mcg Oral QAC breakfast  BMET    Component Value Date/Time   NA 143 02/10/2018 0942   K 4.0 02/10/2018 0942   CL 101 02/10/2018 0942   CO2 21 (L) 02/10/2018 0942   GLUCOSE 158 (H) 02/10/2018 0942   GLUCOSE 112 (H) 08/14/2006 1339   BUN 91 (H) 02/10/2018 0942   CREATININE 6.37 (H) 02/10/2018 0942   CREATININE 1.29 (H) 06/07/2016 1202   CALCIUM 7.2 (L) 02/10/2018 0942   GFRNONAA 5 (L) 02/10/2018 0942   GFRAA 6 (L) 02/10/2018 0942   CBC    Component Value Date/Time   WBC 12.3 (H) 02/10/2018 0942   RBC 4.68 02/10/2018 0942   HGB 12.3 02/10/2018 0942   HCT 37.7 02/10/2018 0942   PLT 168 02/10/2018 0942   MCV 80.6 02/10/2018 0942    MCH 26.3 02/10/2018 0942   MCHC 32.6 02/10/2018 0942   RDW 15.7 (H) 02/10/2018 0942   LYMPHSABS 1.7 02/09/2018 1147   MONOABS 0.7 02/09/2018 1147   EOSABS 0.0 02/09/2018 1147   BASOSABS 0.0 02/09/2018 1147     Assessment/Plan:  1. AKI/CKD stage 3- in setting of UTI and volume depletion with concomitant ACE- inhibition and NSAID use as well as diuretics.  Cr has improved with IVF's.  Continue to follow.  No indication for dialysis at this time.  Continue to hold ACE, lasix, and ibuprofen. 2. Metabolic acidosis- due to #1.  No metformin on her medlist.  Improved with isotonic bicarb drip.  May be able to change to NS soon 3. Hyperkalemia- due to #1 and #2.  Improved with isotonic bicarb 4. DM- per primary 5. CHF EF 25%, will decrease rate of IVF's. 6. UTI- given one dose of rocephin, awaiting cultures 7. T12 fx- avoid NSAIDs 8. HTN- stable 9. PAD- followed by Dr. Allyson Sabal, has atrophic left kidney and may have had left renal artery stenosis.  [name], MD [employer] [phone]

## 2018-02-10 NOTE — Progress Notes (Signed)
PROGRESS NOTE  [name] [name]  ZOX:[id number] DOB: 02/23/1934 DOA: 02/09/2018 PCP: [name], MD   Brief Narrative: [name] [name] is an 84 y.o. female with a history of chronic combined CHF, CAD, HTN, HLD, IDT2DM who presented to the ED for generalized weakness. She reportedly had been taking significant amounts of NSAIDs for pain related to back fractures following a fall at home, diagnosed in the ED 6/25. On arrival she had hyperkalemic (K 5.3) acute renal failure (BUN 91, Cr 8.31) with metabolic acidosis. Nephrology was consulted, IV fluids provided with improvement. She complains of some lower abdominal pain and has pyuria on urinalysis, so ceftriaxone was started pending urine culture.   Assessment & Plan: Principal Problem:   AKI (acute kidney injury) (HCC) Active Problems:   Hypothyroidism   Hyperlipidemia with target LDL less than 70   Congestive dilated cardiomyopathy (HCC)   HTN (hypertension)   Type 2 diabetes mellitus with diabetic neuropathy, with long-term current use of insulin (HCC)  Acute renal failure on stage III CKD: Based on presumed Cr baseline of 1.3-1.4. U/S with 10.5cm (R) and 7.8cm (L) both with atrophy without hydronephrosis.  - Fortunately hyperkalemia has resolved, continue monitoring BMP daily - Continue IVF's with bicarbonate (acidosis improving). Lactic acidemia has resolved. - Follow UOP.  - Appreciate nephrology consulting, no indication for dialysis with improving renal function. Remains uremic (presumed cause of encephalopathy) - Of course, avoid NSAIDs/contrast, etc.  Chronic diastolic CHF: Echo from 2017 showed EF 55-60%, G1DD.  - Holding ACE and diuretic (no evidence of overload) - Monitor I/O closely with IVF's.   Symptomatic pyuria: Leukocytosis improving. - Continue ceftriaxone, follow culture to guide therapy  Acute metabolic encephalopathy:  - Presumed due to BUN elevated possibly UTI as well, will continue to monitor - Called Son  (listed) and had no answer, will continue to reach out.  Compression fractures of T12, L1, L2 - Continue TLSO brace - PT evaluation ordered  Hypothyroidism:  - Recheck TSH (last was 0.91) - Continue synthroid  T2DM: Poorly controlled, HbA1c 8.8%.  - SSI while inpatient, previously on 70/30 and [facility], not on metformin as outpatient.  - Optimize to prevent CKD progression   HTN:  - Continue coreg  Hyperlipidemia:  - Continue statin   History of stroke, cerebrovascular disease 2018:  - Continue ASA, plavix  DVT prophylaxis: Heparin Code Status: Full Family Communication: None at bedside, couldn't reach son by phone Disposition Plan: Uncertain, PT evaluation ordered. Lived w/Son PTA.  Consultants:   Nephrology  Procedures:   None  Antimicrobials:  Ceftriaxone 7/1 >>    Subjective: Pt confused, complaining only of lower abdominal soreness. When specifically asked, confirms mid/lower back pain. She's anxious about a dog being under her bed.   Objective: Vitals:   02/09/18 2333 02/10/18 0422 02/10/18 0830 02/10/18 1136  BP: (!) 101/53 (!) 97/52 (!) 134/54 140/74  Pulse: 76 75 74 75  Resp: 20 18  12   Temp: 98 F (36.7 C) 97.7 F (36.5 C) (!) 97.3 F (36.3 C) 97.6 F (36.4 C)  TempSrc: Oral Oral Oral Oral  SpO2: 97% 95% 97% 97%  Weight:  61.7 kg (136 lb)    Height:        Intake/Output Summary (Last 24 hours) at 02/10/2018 1230 Last data filed at 02/10/2018 0906 Gross per 24 hour  Intake 2730.45 ml  Output 501 ml  Net 2229.45 ml   Filed Weights   02/09/18 1144 02/09/18 2019 02/10/18 0422  Weight: 64.9 kg (  143 lb) 62.1 kg (137 lb) 61.7 kg (136 lb)    Gen: Elderly female in no distress Pulm: Non-labored breathing. Clear to auscultation bilaterally.  CV: Regular rate and rhythm. No murmur, rub, or gallop. No JVD, no pedal edema. GI: Abdomen soft, mild suprapubic tenderness without CVA tenderness, non-distended, with normoactive bowel sounds. No organomegaly or  masses felt. Ext: Warm, no deformities Skin: No rashes, lesions or ulcers Neuro: Alert, not oriented. Moves all extremities and has preserved sensation throughout, not cooperative with other parts of exam. Psych: Judgement and insight appear impaired. Mood is anxious, +delusion.   Data Reviewed: I have personally reviewed following labs and imaging studies  CBC: Recent Labs  Lab 02/09/18 1147 02/10/18 0942  WBC 14.0* 12.3*  NEUTROABS 11.6  --   HGB 13.8 12.3  HCT 42.4 37.7  MCV 83.1 80.6  PLT PLATELET CLUMPS NOTED ON SMEAR, UNABLE TO ESTIMATE 168   Basic Metabolic Panel: Recent Labs  Lab 02/09/18 1147 02/10/18 0942  NA 140 143  K 5.3* 4.0  CL 102 101  CO2 11* 21*  GLUCOSE 282* 158*  BUN 91* 91*  CREATININE 8.31* 6.37*  CALCIUM 8.5* 7.2*   GFR: Estimated Creatinine Clearance: 6.2 mL/min (A) (by C-G formula based on SCr of 6.37 mg/dL (H)). Liver Function Tests: Recent Labs  Lab 02/09/18 1147  AST 43*  ALT 27  ALKPHOS 84  BILITOT 1.0  PROT 8.6*  ALBUMIN 3.6   No results for input(s): LIPASE, AMYLASE in the last 168 hours. No results for input(s): AMMONIA in the last 168 hours. Coagulation Profile: Recent Labs  Lab 02/09/18 1147  INR 1.15   Cardiac Enzymes: No results for input(s): CKTOTAL, CKMB, CKMBINDEX, TROPONINI in the last 168 hours. BNP (last 3 results) No results for input(s): PROBNP in the last 8760 hours. HbA1C: No results for input(s): HGBA1C in the last 72 hours. CBG: Recent Labs  Lab 02/09/18 2128 02/10/18 0737 02/10/18 1146  GLUCAP 234* 147* 166*   Lipid Profile: No results for input(s): CHOL, HDL, LDLCALC, TRIG, CHOLHDL, LDLDIRECT in the last 72 hours. Thyroid Function Tests: No results for input(s): TSH, T4TOTAL, FREET4, T3FREE, THYROIDAB in the last 72 hours. Anemia Panel: No results for input(s): VITAMINB12, FOLATE, FERRITIN, TIBC, IRON, RETICCTPCT in the last 72 hours. Urine analysis:    Component Value Date/Time    COLORURINE YELLOW 02/09/2018 1559   APPEARANCEUR CLOUDY (A) 02/09/2018 1559   LABSPEC 1.015 02/09/2018 1559   PHURINE 5.0 02/09/2018 1559   GLUCOSEU NEGATIVE 02/09/2018 1559   GLUCOSEU NEGATIVE 01/10/2016 1416   HGBUR LARGE (A) 02/09/2018 1559   BILIRUBINUR NEGATIVE 02/09/2018 1559   KETONESUR 5 (A) 02/09/2018 1559   PROTEINUR 100 (A) 02/09/2018 1559   UROBILINOGEN 0.2 01/10/2016 1416   NITRITE NEGATIVE 02/09/2018 1559   LEUKOCYTESUR NEGATIVE 02/09/2018 1559   Recent Results (from the past 240 hour(s))  Culture, blood (Routine x 2)     Status: None (Preliminary result)   Collection Time: 02/09/18 11:47 AM  Result Value Ref Range Status   Specimen Description   Final    LEFT ANTECUBITAL Performed at [facility], [address] [place], [place] [address]    Special Requests   Final    BOTTLES DRAWN AEROBIC AND ANAEROBIC Blood Culture adequate volume Performed at [facility], [address] [place], [place] [address]    Culture   Final    NO GROWTH < 24 HOURS Performed at [facility], [address] [place], [place] [address]    Report Status PENDING  Incomplete  Culture, blood (Routine x 2)     Status: None (Preliminary result)   Collection Time: 02/09/18 11:52 AM  Result Value Ref Range Status   Specimen Description   Final    RIGHT ANTECUBITAL Performed at [facility], [address], [place], [place] [address]    Special Requests   Final    BOTTLES DRAWN AEROBIC AND ANAEROBIC Blood Culture results may not be optimal due to an inadequate volume of blood received in culture bottles Performed at [facility], [address] [place], [place] [address]    Culture   Final    NO GROWTH < 24 HOURS Performed at [facility], [address] [place], [place] [address]    Report Status PENDING  Incomplete      Radiology Studies: US Renal  Result Date: 02/09/2018 CLINICAL DATA:   84 year old female with acute renal insufficiency. EXAM: RENAL / URINARY TRACT ULTRASOUND COMPLETE COMPARISON:  None. FINDINGS: Right Kidney: Length: 10.5 cm. Mild parenchymal atrophy. No hydronephrosis or shadowing stone. Left Kidney: Length: 7.8 cm. The kidney is poorly visualized. There is apparent mild to moderate left renal atrophy. No hydronephrosis or large shadowing stone. Bladder: Appears normal for degree of bladder distention. IMPRESSION: Mild right and moderate left renal parenchymal atrophy. No hydronephrosis or large shadowing stone. Electronically Signed   By: Elgie Collard M.D.   On: 02/09/2018 22:22   Dg Chest Port 1 View  Result Date: 02/09/2018 CLINICAL DATA:  Sepsis. EXAM: PORTABLE CHEST 1 VIEW COMPARISON:  Radiographs of Jan 10, 2016. FINDINGS: The heart size and mediastinal contours are within normal limits. Both lungs are clear. Hypoinflation of the lungs is noted. Left-sided pacemaker is unchanged in position. No pneumothorax or pleural effusion is noted. The visualized skeletal structures are unremarkable. IMPRESSION: Hypoinflation of the lungs. No acute cardiopulmonary abnormality seen. Electronically Signed   By: Lupita Raider, M.D.   On: 02/09/2018 12:36    Scheduled Meds: . aspirin EC  81 mg Oral Daily  . carvedilol  6.25 mg Oral BID  . clopidogrel  75 mg Oral Daily  . heparin  5,000 Units Subcutaneous Q8H  . insulin aspart  0-5 Units Subcutaneous QHS  . insulin aspart  0-9 Units Subcutaneous TID WC  . levothyroxine  50 mcg Oral QAC breakfast   Continuous Infusions: . cefTRIAXone (ROCEPHIN)  IV Stopped (02/09/18 1315)  .  sodium bicarbonate (isotonic) infusion in sterile water 125 mL/hr at 02/10/18 0542     LOS: 1 day   Time spent: 25 minutes.  Tyrone Nine, MD [facility] [email or web address] Password [email or web address] 02/10/2018, 12:30 PM

## 2018-02-10 NOTE — Progress Notes (Signed)
Patient is experiencing loose stools with incontinence. Iv fluids running. Family undated about plan to to hydrate, give Iv antibiotics, and monitor labs. Also encouraging food and fluid intake.

## 2018-02-10 NOTE — Consult Note (Signed)
   [facility] CM Inpatient Consult   02/10/2018  [name] [name] 12-Sep-1933 [id number]   Patient was assessed for [facility] for community services. Patient was previously active with [facility].  Met with patient at bedside with her sons [name] and Maudie Mercury regarding being restarted with [facility] services. There is an active consent form signed and brochure with [facility] information given.  Of note, [facility] Management services does not replace or interfere with any services that are arranged by inpatient case management or social work. Patient was very sleepy on visit.  Stated date of birth and Street. Sons said she was not sleeping well from back pain.  Will continue to follow for progress, disposition and needs.  Patient could benefit from a PT evaluation.    For additional questions or referrals please contact:

## 2018-02-10 NOTE — Plan of Care (Signed)
  Problem: Education: Goal: Knowledge of General Education information will improve Outcome: Progressing   Problem: Health Behavior/Discharge Planning: Goal: Ability to manage health-related needs will improve Outcome: Progressing   Problem: Clinical Measurements: Goal: Ability to maintain clinical measurements within normal limits will improve Outcome: Progressing Goal: Will remain free from infection Outcome: Progressing Goal: Diagnostic test results will improve Outcome: Progressing Goal: Respiratory complications will improve Outcome: Progressing Goal: Cardiovascular complication will be avoided Outcome: Progressing   Problem: Clinical Measurements: Goal: Diagnostic test results will improve Outcome: Progressing   Problem: Clinical Measurements: Goal: Respiratory complications will improve Outcome: Progressing   Problem: Clinical Measurements: Goal: Cardiovascular complication will be avoided Outcome: Progressing   Problem: Pain Managment: Goal: General experience of comfort will improve Outcome: Progressing   Problem: Safety: Goal: Ability to remain free from injury will improve Outcome: Progressing   Problem: Skin Integrity: Goal: Risk for impaired skin integrity will decrease Outcome: Progressing

## 2018-02-10 NOTE — Progress Notes (Signed)
Initial Nutrition Assessment  DOCUMENTATION CODES:   Not applicable  INTERVENTION:   -Magic Cup TID with meals, each supplement provides 290 kcals and 9 grams protein -MVI with minerals daily -30 ml Prostat BID, each supplement provides 100 kcals and 15 grams protein  NUTRITION DIAGNOSIS:   Inadequate oral intake related to nausea, decreased appetite as evidenced by meal completion < 25%, per patient/family report.  GOAL:   Patient will meet greater than or equal to 90% of their needs  MONITOR:   PO intake, Supplement acceptance, Diet advancement, Weight trends, Skin, I & O's  REASON FOR ASSESSMENT:   Malnutrition Screening Tool    ASSESSMENT:   [name] [name] is a 84 y.o. female with medical history of chronic diastolic and systolic CHF, CAD, HTN, HLD, IDDM2 who comes in for generalized weakness.   Pt admitted with AKI and generalized weakness.   Reviewed I/O's: +2.2 L x 24 hours  Spoke with pt, who answered mainly close ended questions. She shares minimal intake over the past few days related to nausea. She reports she tried to eat some breakfast, however, felt too sick to do so.   Case discussed with RN, who confirmed minimal intake. Noted meal completion 15%. Per RN, pt only ate a few bites and sips at breakfast. She just administered zofran prior to RD visit. RN offered Glucerna shake, however, pt refused ("it's too sweet").   Reviewed wt hx; noted pt has experienced a 4.9% wt loss x 1 month, which is not significant for time frame. Suspect dehydration may also be impacting wt changes.   Last Hgb A1c: 8.8 (12/08/17). PTA DM medications 5 mg glipizide BID and 15 units insulin aspart-protamine-aspart BID.  Labs reviewed: CBGS: 147-234 (inpatient orders for glycemic control are 0-5 units insulin aspart q HS and 0-9 units insulin aspart TID with meals)  NUTRITION - FOCUSED PHYSICAL EXAM:    Most Recent Value  Orbital Region  No depletion  Upper Arm Region  No  depletion  Thoracic and Lumbar Region  No depletion  Buccal Region  No depletion  Temple Region  No depletion  Clavicle Bone Region  No depletion  Clavicle and Acromion Bone Region  No depletion  Scapular Bone Region  No depletion  Dorsal Hand  Mild depletion  Patellar Region  Mild depletion  Anterior Thigh Region  Mild depletion  Posterior Calf Region  Mild depletion  Edema (RD Assessment)  Mild  Hair  Reviewed  Eyes  Reviewed  Mouth  Reviewed  Skin  Reviewed  Nails  Reviewed       Diet Order:   Diet Order           Diet Carb Modified Fluid consistency: Thin; Room service appropriate? Yes  Diet effective now          EDUCATION NEEDS:   Not appropriate for education at this time  Skin:  Skin Assessment: Reviewed RN Assessment  Last BM:  PTA  Height:   Ht Readings from Last 1 Encounters:  02/09/18 5\' 5"  (1.651 m)    Weight:   Wt Readings from Last 1 Encounters:  02/10/18 136 lb (61.7 kg)    Ideal Body Weight:  56.8 kg  BMI:  Body mass index is 22.63 kg/m.  Estimated Nutritional Needs:   Kcal:  1500-1700  Protein:  70-85 grams  Fluid:  1.5-1.7 L    Greysin Medlen A. Mayford Knife, RD, LDN, CDE Pager: [phone] After hours Pager: [phone]

## 2018-02-10 NOTE — Progress Notes (Signed)
Patient is alert and confused, restless with back pain gave PRNs , poor diet, refuses to eat, Continuing fluids and Iv antibiotics

## 2018-02-11 ENCOUNTER — Encounter: Payer: Medicare Other | Admitting: Internal Medicine

## 2018-02-11 DIAGNOSIS — N179 Acute kidney failure, unspecified: Secondary | ICD-10-CM

## 2018-02-11 LAB — GLUCOSE, CAPILLARY
GLUCOSE-CAPILLARY: 103 mg/dL — AB (ref 70–99)
GLUCOSE-CAPILLARY: 111 mg/dL — AB (ref 70–99)
Glucose-Capillary: 134 mg/dL — ABNORMAL HIGH (ref 70–99)
Glucose-Capillary: 127 mg/dL — ABNORMAL HIGH (ref 70–99)
Glucose-Capillary: 91 mg/dL (ref 70–99)

## 2018-02-11 LAB — CBC
HEMATOCRIT: 32.6 % — AB (ref 36.0–46.0)
Hemoglobin: 10.4 g/dL — ABNORMAL LOW (ref 12.0–15.0)
MCH: 25.5 pg — ABNORMAL LOW (ref 26.0–34.0)
MCHC: 31.9 g/dL (ref 30.0–36.0)
MCV: 79.9 fL (ref 78.0–100.0)
PLATELETS: 184 10*3/uL (ref 150–400)
RBC: 4.08 MIL/uL (ref 3.87–5.11)
RDW: 14.9 % (ref 11.5–15.5)
WBC: 14.1 10*3/uL — AB (ref 4.0–10.5)

## 2018-02-11 LAB — BASIC METABOLIC PANEL
ANION GAP: 18 — AB (ref 5–15)
BUN: 68 mg/dL — ABNORMAL HIGH (ref 8–23)
CALCIUM: 7.2 mg/dL — AB (ref 8.9–10.3)
CO2: 27 mmol/L (ref 22–32)
CREATININE: 4.69 mg/dL — AB (ref 0.44–1.00)
Chloride: 99 mmol/L (ref 98–111)
GFR calc Af Amer: 9 mL/min — ABNORMAL LOW (ref >60)
GFR, EST NON AFRICAN AMERICAN: 8 mL/min — AB (ref >60)
Glucose, Bld: 105 mg/dL — ABNORMAL HIGH (ref 70–99)
Potassium: 3.3 mmol/L — ABNORMAL LOW (ref 3.5–5.1)
Sodium: 144 mmol/L (ref 135–145)

## 2018-02-11 LAB — MAGNESIUM: Magnesium: 2.1 mg/dL (ref 1.7–2.4)

## 2018-02-11 LAB — TSH: TSH: 0.463 u[IU]/mL (ref 0.350–4.500)

## 2018-02-11 MED ORDER — ACETAMINOPHEN 325 MG PO TABS
650.0000 mg | ORAL_TABLET | Freq: Four times a day (QID) | ORAL | Status: DC
  Administered 2018-02-11 – 2018-02-14 (×10): 650 mg via ORAL
  Filled 2018-02-11 (×10): qty 2

## 2018-02-11 MED ORDER — PRO-STAT SUGAR FREE PO LIQD: 30.0000 mL | Freq: Two times a day (BID) | ORAL | Status: DC

## 2018-02-11 MED ORDER — ADULT MULTIVITAMIN W/MINERALS CH
1.0000 | ORAL_TABLET | Freq: Every day | ORAL | Status: DC
  Administered 2018-02-11 – 2018-02-14 (×4): 1 via ORAL
  Filled 2018-02-11 (×4): qty 1

## 2018-02-11 MED ORDER — LIDOCAINE 5 % EX PTCH
1.0000 | MEDICATED_PATCH | CUTANEOUS | Status: DC
  Administered 2018-02-11 – 2018-02-14 (×4): 1 via TRANSDERMAL
  Filled 2018-02-11 (×4): qty 1

## 2018-02-11 MED ORDER — HYDROCODONE-ACETAMINOPHEN 5-325 MG PO TABS
1.0000 | ORAL_TABLET | Freq: Once | ORAL | Status: AC
  Administered 2018-02-11: 1 via ORAL
  Filled 2018-02-11: qty 1

## 2018-02-11 MED ORDER — OXYCODONE HCL 5 MG PO TABS
2.5000 mg | ORAL_TABLET | Freq: Four times a day (QID) | ORAL | Status: DC | PRN
  Administered 2018-02-11 – 2018-02-13 (×7): 2.5 mg via ORAL
  Filled 2018-02-11 (×7): qty 1

## 2018-02-11 MED ORDER — LACTATED RINGERS IV SOLN
INTRAVENOUS | Status: DC
  Administered 2018-02-11: 75 mL/h via INTRAVENOUS
  Administered 2018-02-12 – 2018-02-13 (×3): via INTRAVENOUS

## 2018-02-11 MED ORDER — PRO-STAT SUGAR FREE PO LIQD
30.0000 mL | Freq: Three times a day (TID) | ORAL | Status: DC
  Administered 2018-02-11 – 2018-02-14 (×5): 30 mL via ORAL
  Filled 2018-02-11 (×7): qty 30

## 2018-02-11 NOTE — Progress Notes (Signed)
S: "I'm dying". O:BP (!) 121/56 (BP Location: Left Arm)   Pulse 68   Temp 98 F (36.7 C) (Oral)   Resp 20   Ht 5\' 5"  (1.651 m)   Wt 63.5 kg (140 lb)   SpO2 97%   BMI 23.30 kg/m   Intake/Output Summary (Last 24 hours) at 02/11/2018 1510 Last data filed at 02/11/2018 1347 Gross per 24 hour  Intake 1148.41 ml  Output 850 ml  Net 298.41 ml   Intake/Output: I/O last 3 completed shifts: In: 1602.1 [I.V.:1502.1; IV Piggyback:100] Out: 851 [Urine:850; Stool:1]  Intake/Output this shift:  Total I/O In: 1148.4 [P.O.:360; I.V.:788.4] Out: 500 [Urine:500] Weight change: -1.361 kg (-3 lb) Gen: elderly, frail, cachectic AAF in NAD CVS: no rub  Resp:cta Abd: benign Ext: no edema  Recent Labs  Lab 02/09/18 1147 02/10/18 0942 02/11/18 0744  NA 140 143 144  K 5.3* 4.0 3.3*  CL 102 101 99  CO2 11* 21* 27  GLUCOSE 282* 158* 105*  BUN 91* 91* 68*  CREATININE 8.31* 6.37* 4.69*  ALBUMIN 3.6  --   --   CALCIUM 8.5* 7.2* 7.2*  AST 43*  --   --   ALT 27  --   --    Liver Function Tests: Recent Labs  Lab 02/09/18 1147  AST 43*  ALT 27  ALKPHOS 84  BILITOT 1.0  PROT 8.6*  ALBUMIN 3.6   No results for input(s): LIPASE, AMYLASE in the last 168 hours. No results for input(s): AMMONIA in the last 168 hours. CBC: Recent Labs  Lab 02/09/18 1147 02/10/18 0942 02/11/18 0744  WBC 14.0* 12.3* 14.1*  NEUTROABS 11.6  --   --   HGB 13.8 12.3 10.4*  HCT 42.4 37.7 32.6*  MCV 83.1 80.6 79.9  PLT PLATELET CLUMPS NOTED ON SMEAR, UNABLE TO ESTIMATE 168 184   Cardiac Enzymes: No results for input(s): CKTOTAL, CKMB, CKMBINDEX, TROPONINI in the last 168 hours. CBG: Recent Labs  Lab 02/10/18 1714 02/10/18 2114 02/11/18 0221 02/11/18 0729 02/11/18 1134  GLUCAP 178* 82 134* 103* 111*    Iron Studies: No results for input(s): IRON, TIBC, TRANSFERRIN, FERRITIN in the last 72 hours. Studies/Results: US Renal  Result Date: 02/09/2018 CLINICAL DATA:  84 year old female with acute renal  insufficiency. EXAM: RENAL / URINARY TRACT ULTRASOUND COMPLETE COMPARISON:  None. FINDINGS: Right Kidney: Length: 10.5 cm. Mild parenchymal atrophy. No hydronephrosis or shadowing stone. Left Kidney: Length: 7.8 cm. The kidney is poorly visualized. There is apparent mild to moderate left renal atrophy. No hydronephrosis or large shadowing stone. Bladder: Appears normal for degree of bladder distention. IMPRESSION: Mild right and moderate left renal parenchymal atrophy. No hydronephrosis or large shadowing stone. Electronically Signed   By: Elgie Collard M.D.   On: 02/09/2018 22:22   . acetaminophen  650 mg Oral Q6H  . aspirin EC  81 mg Oral Daily  . carvedilol  6.25 mg Oral BID  . clopidogrel  75 mg Oral Daily  . feeding supplement (PRO-STAT SUGAR FREE 64)  30 mL Oral TID BM  . heparin  5,000 Units Subcutaneous Q8H  . insulin aspart  0-5 Units Subcutaneous QHS  . insulin aspart  0-9 Units Subcutaneous TID WC  . levothyroxine  50 mcg Oral QAC breakfast  . lidocaine  1 patch Transdermal Q24H  . multivitamin with minerals  1 tablet Oral Daily    BMET    Component Value Date/Time   NA 144 02/11/2018 0744   K 3.3 (L)  02/11/2018 0744   CL 99 02/11/2018 0744   CO2 27 02/11/2018 0744   GLUCOSE 105 (H) 02/11/2018 0744   GLUCOSE 112 (H) 08/14/2006 1339   BUN 68 (H) 02/11/2018 0744   CREATININE 4.69 (H) 02/11/2018 0744   CREATININE 1.29 (H) 06/07/2016 1202   CALCIUM 7.2 (L) 02/11/2018 0744   GFRNONAA 8 (L) 02/11/2018 0744   GFRAA 9 (L) 02/11/2018 0744   CBC    Component Value Date/Time   WBC 14.1 (H) 02/11/2018 0744   RBC 4.08 02/11/2018 0744   HGB 10.4 (L) 02/11/2018 0744   HCT 32.6 (L) 02/11/2018 0744   PLT 184 02/11/2018 0744   MCV 79.9 02/11/2018 0744   MCH 25.5 (L) 02/11/2018 0744   MCHC 31.9 02/11/2018 0744   RDW 14.9 02/11/2018 0744   LYMPHSABS 1.7 02/09/2018 1147   MONOABS 0.7 02/09/2018 1147   EOSABS 0.0 02/09/2018 1147   BASOSABS 0.0 02/09/2018 1147      Assessment/Plan:  1. AKI/CKD stage 3- in setting of UTI and volume depletion with concomitant ACE- inhibition and NSAID use as well as diuretics.  Cr has improved with IVF's.  Continue to follow.  No indication for dialysis at this time.  Continue to hold ACE, lasix, and ibuprofen.  Pt is not a dialysis candidate given advanced age and poor functional, cognitive, and nutritional status. 2. Metabolic acidosis- due to #1.  No metformin on her medlist.  Improved with isotonic bicarb drip. 1. will change to NS  3. Hyperkalemia- due to #1 and #2.  Improved with isotonic bicarb 4. DM- per primary 5. Anemia- possibly due to acute illness but will check iron stores and spep. 6. CHF EF 25%, will decrease rate of IVF's. 7. UTI- given one dose of rocephin, awaiting cultures 8. T12 fx- avoid NSAIDs 9. HTN- stable 10. PAD- followed by Dr. Allyson Sabal, has atrophic left kidney and may have had left renal artery stenosis contributing to her  CKD.   [name], MD [employer] [phone]

## 2018-02-11 NOTE — Progress Notes (Signed)
Patient family unable to be with patient due to illness with another sibling. Asked for an sitter order. prns on board.

## 2018-02-11 NOTE — Progress Notes (Signed)
PROGRESS NOTE    [name]  ZOX:[id number] DOB: [birth year] DOA: 02/09/2018 PCP: [name], MD   Brief Narrative:  [name] [name] is an 84 y.o. female with [name] [name] history of chronic combined CHF, CAD, HTN, HLD, IDT2DM who presented to the ED for generalized weakness. She reportedly had been taking significant amounts of NSAIDs for pain related to back fractures following Aul Mangieri fall at home, diagnosed in the ED 6/25. On arrival she had hyperkalemic (K 5.3) acute renal failure (BUN 91, Cr 8.31) with metabolic acidosis. Nephrology was consulted, IV fluids provided with improvement. She complains of some lower abdominal pain and has pyuria on urinalysis, so ceftriaxone was started pending urine culture.   Assessment & Plan:   Principal Problem:   AKI (acute kidney injury) (HCC) Active Problems:   Hypothyroidism   Hyperlipidemia with target LDL less than 70   Congestive dilated cardiomyopathy (HCC)   HTN (hypertension)   Type 2 diabetes mellitus with diabetic neuropathy, with long-term current use of insulin (HCC)   Acute renal failure on stage III CKD: Based on presumed Cr baseline of 1.3-1.4. U/S with 10.5cm (R) and 7.8cm (L) both with atrophy without hydronephrosis.  - Fortunately hyperkalemia has resolved, continue monitoring BMP daily - Continue IVF's.  Acidemia resolved.   - Bicarb has improved, will switch to LR - Follow UOP.  - Appreciate nephrology consulting, no indication for dialysis with improving renal function. Remains uremic (presumed cause of encephalopathy) - Of course, avoid NSAIDs/contrast, etc.  Acute metabolic encephalopathy:  - Urine culture negative.  ?underlying dementia.  Also, uremia above could be contributing as well as acute pain.  Son notes that she's gradually improving, but not quite to baseline - normal TSH.  Follow B12 and RPR. - continue to monitor, supportive care - delirium prec  Poor PO intake: likely 2/2 above, will continue to monitor.  Nutrition  c/s.  Loose Stools: follow, consider w/u further if persistent  Chronic diastolic CHF: Echo from 2017 showed EF 55-60%, G1DD.  - Holding ACE and diuretic (no evidence of overload) - Monitor I/O closely with IVF's.   Symptomatic pyuria: Leukocytosis persistent. - No growth on culture - will d/c ceftriaxone  Compression fractures of T12, L1, L2 - Continue TLSO brace - PT evaluation ordered - Scheduled APAP, low dose oxycodone prn.  Lidocaine patch.   Hypothyroidism:  - TSH wnl - Continue synthroid  T2DM: Poorly controlled, HbA1c 8.8%.  - SSI while inpatient, previously on 70/30 and [facility], not on metformin as outpatient.  - Optimize to prevent CKD progression   HTN:  - Continue coreg  Hyperlipidemia:  - Continue statin   History of stroke, cerebrovascular disease 2018:  - Continue ASA, plavix  Leukocytosis: follow  Hypokalemia: mild, follow with AKI  DVT prophylaxis: heparin Code Status: full  Family Communication: son and family at bedside Disposition Plan: pending imrpovement, liekly SNF   Consultants:   renal  Procedures:   none  Antimicrobials:  Anti-infectives (From admission, onward)   Start     Dose/Rate Route Frequency Ordered Stop   02/09/18 1230  cefTRIAXone (ROCEPHIN) 2 g in sodium chloride 0.9 % 100 mL IVPB  Status:  Discontinued     2 g 200 mL/hr over 30 Minutes Intravenous Every 24 hours 02/09/18 1222 02/11/18 1608         Subjective: Timiko Offutt&Ox1 (said June, didn't answer location) C/o back pain.  Objective: Vitals:   02/10/18 1947 02/10/18 2100 02/11/18 0225 02/11/18 0605  BP: (!) 95/44 Marland Kitchen)  115/50 (!) 130/50 (!) 124/58  Pulse: 75  77 80  Resp: 20  18 18   Temp: 98 F (36.7 C)  98 F (36.7 C) 98.5 F (36.9 C)  TempSrc: Oral  Oral Oral  SpO2: 95%  93% 94%  Weight:    63.5 kg (140 lb)  Height:        Intake/Output Summary (Last 24 hours) at 02/11/2018 0931 Last data filed at 02/11/2018 0420 Gross per 24 hour  Intake 971.63 ml    Output 350 ml  Net 621.63 ml   Filed Weights   02/09/18 2019 02/10/18 0422 02/11/18 0605  Weight: 62.1 kg (137 lb) 61.7 kg (136 lb) 63.5 kg (140 lb)    Examination:  General exam: Appears calm and comfortable.  Sitting up in chair.  Respiratory system: Clear to auscultation. Respiratory effort normal. Cardiovascular system: S1 & S2 heard, RRR. Gastrointestinal system: Abdomen is nondistended, soft and nontender. Central nervous system: Alert and oriented x1. No gross focal neurological deficits appreciated. Extremities: no LEE MSK: in TLSO brace Skin: No rashes, lesions or ulcers    Data Reviewed: I have personally reviewed following labs and imaging studies  CBC: Recent Labs  Lab 02/09/18 1147 02/10/18 0942 02/11/18 0744  WBC 14.0* 12.3* 14.1*  NEUTROABS 11.6  --   --   HGB 13.8 12.3 10.4*  HCT 42.4 37.7 32.6*  MCV 83.1 80.6 79.9  PLT PLATELET CLUMPS NOTED ON SMEAR, UNABLE TO ESTIMATE 168 184   Basic Metabolic Panel: Recent Labs  Lab 02/09/18 1147 02/10/18 0942 02/11/18 0744  NA 140 143 144  K 5.3* 4.0 3.3*  CL 102 101 99  CO2 11* 21* 27  GLUCOSE 282* 158* 105*  BUN 91* 91* 68*  CREATININE 8.31* 6.37* 4.69*  CALCIUM 8.5* 7.2* 7.2*  MG  --   --  2.1   GFR: Estimated Creatinine Clearance: 8.5 mL/min (Eusebia Grulke) (by C-G formula based on SCr of 4.69 mg/dL (H)). Liver Function Tests: Recent Labs  Lab 02/09/18 1147  AST 43*  ALT 27  ALKPHOS 84  BILITOT 1.0  PROT 8.6*  ALBUMIN 3.6   No results for input(s): LIPASE, AMYLASE in the last 168 hours. No results for input(s): AMMONIA in the last 168 hours. Coagulation Profile: Recent Labs  Lab 02/09/18 1147  INR 1.15   Cardiac Enzymes: No results for input(s): CKTOTAL, CKMB, CKMBINDEX, TROPONINI in the last 168 hours. BNP (last 3 results) No results for input(s): PROBNP in the last 8760 hours. HbA1C: No results for input(s): HGBA1C in the last 72 hours. CBG: Recent Labs  Lab 02/10/18 1146 02/10/18 1714  02/10/18 2114 02/11/18 0221 02/11/18 0729  GLUCAP 166* 178* 82 134* 103*   Lipid Profile: No results for input(s): CHOL, HDL, LDLCALC, TRIG, CHOLHDL, LDLDIRECT in the last 72 hours. Thyroid Function Tests: Recent Labs    02/11/18 0544  TSH 0.463   Anemia Panel: No results for input(s): VITAMINB12, FOLATE, FERRITIN, TIBC, IRON, RETICCTPCT in the last 72 hours. Sepsis Labs: Recent Labs  Lab 02/09/18 1206 02/09/18 1556  LATICACIDVEN 2.38* 0.98    Recent Results (from the past 240 hour(s))  Culture, blood (Routine x 2)     Status: None (Preliminary result)   Collection Time: 02/09/18 11:47 AM  Result Value Ref Range Status   Specimen Description   Final    LEFT ANTECUBITAL Performed at [facility], [address], [place], [place] [address]    Special Requests   Final  BOTTLES DRAWN AEROBIC AND ANAEROBIC Blood Culture adequate volume Performed at [facility], [address] [place], [place] [address]    Culture   Final    NO GROWTH < 24 HOURS Performed at [facility], [address], [place], [place] [address]    Report Status PENDING  Incomplete  Culture, blood (Routine x 2)     Status: None (Preliminary result)   Collection Time: 02/09/18 11:52 AM  Result Value Ref Range Status   Specimen Description   Final    RIGHT ANTECUBITAL Performed at [facility], [address], [place], [place] [address]    Special Requests   Final    BOTTLES DRAWN AEROBIC AND ANAEROBIC Blood Culture results may not be optimal due to an inadequate volume of blood received in culture bottles Performed at [facility], [address], [place], [place] [address]    Culture   Final    NO GROWTH < 24 HOURS Performed at [facility], [address] [place], [place] [address]    Report Status PENDING  Incomplete  Urine culture     Status: None   Collection Time: 02/09/18  3:59 PM  Result Value Ref  Range Status   Specimen Description   Final    URINE, CATHETERIZED Performed at [facility], [address], [place], [place] [address]    Special Requests   Final    NONE Performed at [facility], [address] [place], [place] [address]    Culture   Final    NO GROWTH Performed at [facility], [address], [place], [place] [address]    Report Status 02/10/2018 FINAL  Final         Radiology Studies: US Renal  Result Date: 02/09/2018 CLINICAL DATA:  84 year old female with acute renal insufficiency. EXAM: RENAL / URINARY TRACT ULTRASOUND COMPLETE COMPARISON:  None. FINDINGS: Right Kidney: Length: 10.5 cm. Mild parenchymal atrophy. No hydronephrosis or shadowing stone. Left Kidney: Length: 7.8 cm. The kidney is poorly visualized. There is apparent mild to moderate left renal atrophy. No hydronephrosis or large shadowing stone. Bladder: Appears normal for degree of bladder distention. IMPRESSION: Mild right and moderate left renal parenchymal atrophy. No hydronephrosis or large shadowing stone. Electronically Signed   By: Elgie Collard M.D.   On: 02/09/2018 22:22   Dg Chest Port 1 View  Result Date: 02/09/2018 CLINICAL DATA:  Sepsis. EXAM: PORTABLE CHEST 1 VIEW COMPARISON:  Radiographs of Jan 10, 2016. FINDINGS: The heart size and mediastinal contours are within normal limits. Both lungs are clear. Hypoinflation of the lungs is noted. Left-sided pacemaker is unchanged in position. No pneumothorax or pleural effusion is noted. The visualized skeletal structures are unremarkable. IMPRESSION: Hypoinflation of the lungs. No acute cardiopulmonary abnormality seen. Electronically Signed   By: Lupita Raider, M.D.   On: 02/09/2018 12:36        Scheduled Meds: . aspirin EC  81 mg Oral Daily  . carvedilol  6.25 mg Oral BID  . clopidogrel  75 mg Oral Daily  . heparin  5,000 Units Subcutaneous Q8H  . insulin aspart  0-5 Units  Subcutaneous QHS  . insulin aspart  0-9 Units Subcutaneous TID WC  . levothyroxine  50 mcg Oral QAC breakfast   Continuous Infusions: . cefTRIAXone (ROCEPHIN)  IV 2 g (02/10/18 1419)  . lactated ringers       LOS: 2 days  Time spent: over 30 min    Lacretia Nicks, MD [facility] Pager [phone]   If 7PM-7AM, please contact night-coverage [email or web address] Password [email or web address] 02/11/2018, 9:31 AM

## 2018-02-11 NOTE — Progress Notes (Signed)
Nutrition Follow-up  DOCUMENTATION CODES:   Not applicable  INTERVENTION:   -Continue Magic Cup TID with meals, each supplement provides 290 kcals and 9 grams protein -Hormel Shake TID with meals, each supplement provides 520 kcals and 22 grams protein -Continue MVI with minerals daily -Increase Prostat TID, each supplement provides 100 kcals and 15 grams protein -If prolonged poor po intake persists, consider initiation of short term nutrition support (ex cortrak tube). Recommend: Initiate Jevity 1.2 @ 25 ml/hr and increase by 10 ml every 4 hours to goal rate of 55 ml/hr. Tube feeding regimen provides 1584 kcal (100% of needs), 73 grams of protein, and 1065 ml of H2O.   NUTRITION DIAGNOSIS:   Inadequate oral intake related to nausea, decreased appetite as evidenced by meal completion < 25%, per patient/family report.  Ongoing  GOAL:   Patient will meet greater than or equal to 90% of their needs  Unmet  MONITOR:   PO intake, Supplement acceptance, Diet advancement, Weight trends, Skin, I & O's  REASON FOR ASSESSMENT:   Consult Assessment of nutrition requirement/status  ASSESSMENT:   [name] [name] is a 84 y.o. female with medical history of chronic diastolic and systolic CHF, CAD, HTN, HLD, IDDM2 who comes in for generalized weakness.   RD re-consulted for assessment. See note on 02/10/18 for additional details.  Reviewed I/O's: +622 ml x 24 hours and +2.9 L since admission  Pt sitting in recliner chair at time of visit. Pt very selective about which questions she answered. She shares that she is still feeling terrible and her nausea has not resolved.   Case discussed with RN, who reports that pt is still eating very little. Pt has only consumed one bite of Magic Cup today to take medications. Per RN, renal function has improved significantly since yesterday. Pt has also been experiencing intermittent loose stools.   If pt continues to refuse PO intake, may be need  consider short term nutrition support (ex Cortrak tube) vs goals of care discussions.   Labs reviewed: K: 3.3, CBGS: 82-134 (inpatient orders for glycemic control are 0-5 units insulin aspart q HS, 0-9 units insulin aspart TID with meals).   Diet Order:   Diet Order           Diet Carb Modified Fluid consistency: Thin; Room service appropriate? Yes  Diet effective now          EDUCATION NEEDS:   Not appropriate for education at this time  Skin:  Skin Assessment: Reviewed RN Assessment  Last BM:  02/11/18  Height:   Ht Readings from Last 1 Encounters:  02/09/18 5\' 5"  (1.651 m)    Weight:   Wt Readings from Last 1 Encounters:  02/11/18 140 lb (63.5 kg)    Ideal Body Weight:  56.8 kg  BMI:  Body mass index is 23.3 kg/m.  Estimated Nutritional Needs:   Kcal:  1500-1700  Protein:  70-85 grams  Fluid:  1.5-1.7 L    [name] [name] A. Mayford Knife, RD, LDN, CDE Pager: [phone] After hours Pager: [phone]

## 2018-02-11 NOTE — Plan of Care (Signed)
  Problem: Activity: Goal: Risk for activity intolerance will decrease Outcome: Progressing   Problem: Safety: Goal: Ability to remain free from injury will improve Outcome: Progressing   Problem: Pain Managment: Goal: General experience of comfort will improve Outcome: Not Progressing  Pt. Complains of pain to lower right back

## 2018-02-11 NOTE — Progress Notes (Signed)
Patient experiencing frequent stools with stomach pains and back pains MD aware gave PRNs and muscle rub for relieve.

## 2018-02-11 NOTE — Progress Notes (Signed)
Labs redrawn due to insufficient blood collect in the early morning pending results. Patient continue to fluctuate with behavior and back makes restless. Also not eating.

## 2018-02-11 NOTE — Evaluation (Signed)
Physical Therapy Evaluation Patient Details Name: [name] [name] MRN: [id number] DOB: 03-16-1934 Today's Date: 02/11/2018   History of Present Illness  [name] [name] is a 84 y.o. female with medical history of chronic diastolic and systolic CHF, CAD, HTN, HLD, IDDM2 who comes in for generalized weakness. Family is not present and history is obtained from ED physician. The patient is found to have a UTI and AKI. She fell recently and was seen in the ED on 6/25 has back fractures and has been receiving NSAIDs.      Clinical Impression  Pt admitted with above diagnosis. Pt currently with functional limitations due to the deficits listed below (see PT Problem List). Pt unreliable historian, reports living with son ambulating without RW and that son works during day. Patient noticeably uncomfortable and moving anxiously/impulsivly during visit. Upon eval pt standing EOB with fecal and urinary incontinence, mod A for safe stand pivot transfers to [facility] and to bedside chair. Pt presents as high fall risk currently, following commands 50-60% of time. No family present to obtain accurate history or level of support.  Pt will benefit from skilled PT to increase their independence and safety with mobility to allow discharge to the venue listed below.       Follow Up Recommendations SNF;Supervision/Assistance - 24 hour    Equipment Recommendations  (TBD next venue)    Recommendations for Other Services OT consult     Precautions / Restrictions Precautions Precautions: Fall Restrictions Weight Bearing Restrictions: No      Mobility  Bed Mobility Overal bed mobility: Needs Assistance Bed Mobility: Supine to Sit     Supine to sit: Supervision        Transfers Overall transfer level: Needs assistance Equipment used: 1 person hand held assist Transfers: Stand Pivot Transfers;Sit to/from Stand Sit to Stand: Min assist Stand pivot transfers: Mod assist       General transfer comment: Min A  to stand, mod A for safe transfer due to balance and weakness to provide stability. Patient transfered to [facility] and to besdie chair. fecal and urinary incontinence upon standing.   Ambulation/Gait             General Gait Details: defered 2/2 safety  Stairs            Wheelchair Mobility    Modified Rankin (Stroke Patients Only)       Balance Overall balance assessment: Needs assistance   Sitting balance-Leahy Scale: Fair       Standing balance-Leahy Scale: Poor                               Pertinent Vitals/Pain Pain Assessment: Faces Faces Pain Scale: Hurts even more Pain Location: back Pain Descriptors / Indicators: Grimacing Pain Intervention(s): Limited activity within patient's tolerance;Monitored during session    Home Living Family/patient expects to be discharged to:: Skilled nursing facility Living Arrangements: Children(Son)               Additional Comments: Pt unreliable historian    Prior Function Level of Independence: (Unreliable historian )               Hand Dominance        Extremity/Trunk Assessment   Upper Extremity Assessment Upper Extremity Assessment: Difficult to assess due to impaired cognition    Lower Extremity Assessment Lower Extremity Assessment: Difficult to assess due to impaired cognition       Communication  Cognition Arousal/Alertness: Awake/alert Behavior During Therapy: Anxious;Impulsive Overall Cognitive Status: No family/caregiver present to determine baseline cognitive functioning                                 General Comments: intermittent following commands, impulsive and anxious movement suspect due to pain of back. unaware of time and situation.       General Comments      Exercises     Assessment/Plan    PT Assessment Patient needs continued PT services  PT Problem List Decreased range of motion;Decreased strength;Decreased activity  tolerance;Decreased coordination;Decreased balance;Decreased mobility;Decreased cognition;Decreased safety awareness;Decreased knowledge of use of DME;Decreased knowledge of precautions;Pain       PT Treatment Interventions DME instruction;Gait training;Stair training;Functional mobility training;Therapeutic activities;Therapeutic exercise;Balance training    PT Goals (Current goals can be found in the Care Plan section)  Acute Rehab PT Goals Patient Stated Goal: non stated PT Goal Formulation: With patient Time For Goal Achievement: 02/18/18 Potential to Achieve Goals: Good    Frequency Min 3X/week   Barriers to discharge        Co-evaluation               AM-PAC PT "6 Clicks" Daily Activity  Outcome Measure Difficulty turning over in bed (including adjusting bedclothes, sheets and blankets)?: A Little Difficulty moving from lying on back to sitting on the side of the bed? : A Little Difficulty sitting down on and standing up from a chair with arms (e.g., wheelchair, bedside commode, etc,.)?: A Little Help needed moving to and from a bed to chair (including a wheelchair)?: A Lot Help needed walking in hospital room?: A Lot Help needed climbing 3-5 steps with a railing? : A Lot 6 Click Score: 15    End of Session Equipment Utilized During Treatment: Gait belt Activity Tolerance: Patient limited by fatigue Patient left: in chair;with call bell/phone within reach;with nursing/sitter in room;with chair alarm set Nurse Communication: Mobility status PT Visit Diagnosis: Unsteadiness on feet (R26.81);Muscle weakness (generalized) (M62.81);History of falling (Z91.81);Pain Pain - part of body: (back)    Time: 2774-1287 PT Time Calculation (min) (ACUTE ONLY): 22 min   Charges:   PT Evaluation $PT Eval Moderate Complexity: 1 Mod PT Treatments $Therapeutic Activity: 8-22 mins   PT G Codes:        [name], PT, DPT Acute Rehab Services Pager:  [phone]    [name] 02/11/2018, 2:12 PM

## 2018-02-12 LAB — GLUCOSE, CAPILLARY
GLUCOSE-CAPILLARY: 102 mg/dL — AB (ref 70–99)
GLUCOSE-CAPILLARY: 161 mg/dL — AB (ref 70–99)
Glucose-Capillary: 155 mg/dL — ABNORMAL HIGH (ref 70–99)
Glucose-Capillary: 196 mg/dL — ABNORMAL HIGH (ref 70–99)

## 2018-02-12 LAB — BASIC METABOLIC PANEL
Anion gap: 14 (ref 5–15)
BUN: 47 mg/dL — ABNORMAL HIGH (ref 8–23)
CO2: 28 mmol/L (ref 22–32)
CREATININE: 3.36 mg/dL — AB (ref 0.44–1.00)
Calcium: 8.3 mg/dL — ABNORMAL LOW (ref 8.9–10.3)
Chloride: 102 mmol/L (ref 98–111)
GFR, EST AFRICAN AMERICAN: 14 mL/min — AB (ref >60)
GFR, EST NON AFRICAN AMERICAN: 12 mL/min — AB (ref >60)
Glucose, Bld: 91 mg/dL (ref 70–99)
POTASSIUM: 3.1 mmol/L — AB (ref 3.5–5.1)
SODIUM: 144 mmol/L (ref 135–145)

## 2018-02-12 LAB — VITAMIN B12: Vitamin B-12: 495 pg/mL (ref 180–914)

## 2018-02-12 LAB — CBC
HCT: 34.5 % — ABNORMAL LOW (ref 36.0–46.0)
HEMOGLOBIN: 10.9 g/dL — AB (ref 12.0–15.0)
MCH: 25.8 pg — ABNORMAL LOW (ref 26.0–34.0)
MCHC: 31.6 g/dL (ref 30.0–36.0)
MCV: 81.8 fL (ref 78.0–100.0)
PLATELETS: 177 10*3/uL (ref 150–400)
RBC: 4.22 MIL/uL (ref 3.87–5.11)
RDW: 14.9 % (ref 11.5–15.5)
WBC: 11.1 10*3/uL — AB (ref 4.0–10.5)

## 2018-02-12 LAB — MAGNESIUM: MAGNESIUM: 1.9 mg/dL (ref 1.7–2.4)

## 2018-02-12 MED ORDER — POTASSIUM CHLORIDE CRYS ER 20 MEQ PO TBCR
20.0000 meq | EXTENDED_RELEASE_TABLET | Freq: Two times a day (BID) | ORAL | Status: AC
  Administered 2018-02-12 (×2): 20 meq via ORAL
  Filled 2018-02-12 (×2): qty 1

## 2018-02-12 NOTE — Progress Notes (Signed)
PROGRESS NOTE    [name] [name]  BJY:[id number] DOB: Feb 14, 1934 DOA: 02/09/2018 PCP: [name], MD   Brief Narrative:  [name] [name] is an 85 y.o. female with Denea Cheaney history of chronic combined CHF, CAD, HTN, HLD, IDT2DM who presented to the ED for generalized weakness. She reportedly had been taking significant amounts of NSAIDs for pain related to back fractures following Siris Hoos fall at home, diagnosed in the ED 6/25. On arrival she had hyperkalemic (K 5.3) acute renal failure (BUN 91, Cr 8.31) with metabolic acidosis. Nephrology was consulted, IV fluids provided with improvement. She complains of some lower abdominal pain and has pyuria on urinalysis, so ceftriaxone was started pending urine culture.   Assessment & Plan:   Principal Problem:   AKI (acute kidney injury) (HCC) Active Problems:   Hypothyroidism   Hyperlipidemia with target LDL less than 70   Congestive dilated cardiomyopathy (HCC)   HTN (hypertension)   Type 2 diabetes mellitus with diabetic neuropathy, with long-term current use of insulin (HCC)   Acute renal failure on stage III CKD: Based on presumed Cr baseline of 1.3-1.4. U/S with 10.5cm (R) and 7.8cm (L) both with atrophy without hydronephrosis.  - Creatinine peaked to 8.31, improving with IVF, baseline around 1.4 - Continue IVF's.  Acidemia resolved.   - Bicarb has improved, will switch to LR - Follow UOP (adequate) - Appreciate nephrology consulting, no indication for dialysis with improving renal function. Uremia improving (presumed cause of encephalopathy) - Of course, avoid NSAIDs/contrast, etc.  Acute metabolic encephalopathy:  - Urine culture negative.  ?underlying dementia.  Also, uremia above could be contributing as well as acute pain.  Son notes that she's gradually improving, but not quite to baseline.  Since yesterday, seems to be improving. - normal TSH.  Follow B12 (wnl) and RPR (pending). - continue to monitor, supportive care - delirium  prec  Poor PO intake: likely 2/2 above.  Still not great, but has appetite today per Nurse Tech.  Supplements per nutrition (noted could consider short term nutrition support, but with report of better appetite today, would want to hold off on this if possible)   Loose Stools: none noted today   Chronic diastolic CHF: Echo from 2017 showed EF 55-60%, G1DD.  - Holding ACE and diuretic (no evidence of overload) - Monitor I/O closely with IVF's.   Symptomatic pyuria: Leukocytosis persistent. - No growth on culture - will d/c ceftriaxone  Compression fractures of T12, L1, L2 - Continue TLSO brace - PT evaluation ordered - Scheduled APAP, low dose oxycodone prn.  Lidocaine patch.  - Pain improved today  Hypothyroidism:  - TSH wnl - Continue synthroid  T2DM: Poorly controlled, HbA1c 8.8%.  - SSI while inpatient, previously on 70/30 and [facility], not on metformin as outpatient.  - BG appropriate   HTN:  - Continue coreg  Hyperlipidemia:  - Continue statin   History of stroke, cerebrovascular disease 2018:  - Continue ASA, plavix  Leukocytosis: follow  Hypokalemia: mild, follow with AKI  DVT prophylaxis: heparin Code Status: full  Family Communication: none at bedside Disposition Plan: pending imrpovement, [facility] SNF   Consultants:   renal  Procedures:   none  Antimicrobials:  Anti-infectives (From admission, onward)   Start     Dose/Rate Route Frequency Ordered Stop   02/09/18 1230  cefTRIAXone (ROCEPHIN) 2 g in sodium chloride 0.9 % 100 mL IVPB  Status:  Discontinued     2 g 200 mL/hr over 30 Minutes Intravenous Every 24 hours  02/09/18 1222 02/11/18 1608         Subjective: Tashon Capp&Ox3 today.  Didn't remember me. Pain is better.  Objective: Vitals:   02/11/18 0605 02/11/18 1139 02/11/18 1918 02/12/18 0400  BP: (!) 124/58 (!) 121/56 (!) 144/70 (!) 157/54  Pulse: 80 68 70 80  Resp: 18 20 16 18   Temp: 98.5 F (36.9 C) 98 F (36.7 C) 98.6 F (37 C) 98  F (36.7 C)  TempSrc: Oral Oral Oral Oral  SpO2: 94% 97% 94% 96%  Weight: 63.5 kg (140 lb)   62.4 kg (137 lb 9.1 oz)  Height:        Intake/Output Summary (Last 24 hours) at 02/12/2018 1136 Last data filed at 02/12/2018 0946 Gross per 24 hour  Intake 1710.23 ml  Output 750 ml  Net 960.23 ml   Filed Weights   02/10/18 0422 02/11/18 0605 02/12/18 0400  Weight: 61.7 kg (136 lb) 63.5 kg (140 lb) 62.4 kg (137 lb 9.1 oz)    Examination:  General: No acute distress.  Looks better overall than yesterday.  Frail.  Cardiovascular: Heart sounds show Cornelia Walraven regular rate, and rhythm. Lungs: Clear to auscultation bilaterally with good air movement. Abdomen: Soft, nontender, nondistended Neurological: Alert and oriented 3. Moves all extremities 4Cranial nerves II through XII grossly intact.  Less confused. Skin: Warm and dry. No rashes or lesions. Extremities: No clubbing or cyanosis. No edema.  Psychiatric: Mood and affect are normal. Insight and judgment are Yasuo Phimmasone[[rp[roate.   Data Reviewed: I have personally reviewed following labs and imaging studies  CBC: Recent Labs  Lab 02/09/18 1147 02/10/18 0942 02/11/18 0744 02/12/18 0605  WBC 14.0* 12.3* 14.1* 11.1*  NEUTROABS 11.6  --   --   --   HGB 13.8 12.3 10.4* 10.9*  HCT 42.4 37.7 32.6* 34.5*  MCV 83.1 80.6 79.9 81.8  PLT PLATELET CLUMPS NOTED ON SMEAR, UNABLE TO ESTIMATE 168 184 177   Basic Metabolic Panel: Recent Labs  Lab 02/09/18 1147 02/10/18 0942 02/11/18 0744 02/12/18 0605  NA 140 143 144 144  K 5.3* 4.0 3.3* 3.1*  CL 102 101 99 102  CO2 11* 21* 27 28  GLUCOSE 282* 158* 105* 91  BUN 91* 91* 68* 47*  CREATININE 8.31* 6.37* 4.69* 3.36*  CALCIUM 8.5* 7.2* 7.2* 8.3*  MG  --   --  2.1 1.9   GFR: Estimated Creatinine Clearance: 11.8 mL/min (Akeisha Lagerquist) (by C-G formula based on SCr of 3.36 mg/dL (H)). Liver Function Tests: Recent Labs  Lab 02/09/18 1147  AST 43*  ALT 27  ALKPHOS 84  BILITOT 1.0  PROT 8.6*  ALBUMIN 3.6   No  results for input(s): LIPASE, AMYLASE in the last 168 hours. No results for input(s): AMMONIA in the last 168 hours. Coagulation Profile: Recent Labs  Lab 02/09/18 1147  INR 1.15   Cardiac Enzymes: No results for input(s): CKTOTAL, CKMB, CKMBINDEX, TROPONINI in the last 168 hours. BNP (last 3 results) No results for input(s): PROBNP in the last 8760 hours. HbA1C: No results for input(s): HGBA1C in the last 72 hours. CBG: Recent Labs  Lab 02/11/18 0729 02/11/18 1134 02/11/18 1642 02/11/18 2112 02/12/18 0720  GLUCAP 103* 111* 127* 91 102*   Lipid Profile: No results for input(s): CHOL, HDL, LDLCALC, TRIG, CHOLHDL, LDLDIRECT in the last 72 hours. Thyroid Function Tests: Recent Labs    02/11/18 0544  TSH 0.463   Anemia Panel: Recent Labs    02/12/18 0605  VITAMINB12 495   Sepsis Labs:  Recent Labs  Lab 02/09/18 1206 02/09/18 1556  LATICACIDVEN 2.38* 0.98    Recent Results (from the past 240 hour(s))  Culture, blood (Routine x 2)     Status: None (Preliminary result)   Collection Time: 02/09/18 11:47 AM  Result Value Ref Range Status   Specimen Description   Final    LEFT ANTECUBITAL Performed at [facility], [address], [place], [place] [address]    Special Requests   Final    BOTTLES DRAWN AEROBIC AND ANAEROBIC Blood Culture adequate volume Performed at [facility], [address] [place], [place] [address]    Culture   Final    NO GROWTH 2 DAYS Performed at [facility], [address] [place], [place] [address]    Report Status PENDING  Incomplete  Culture, blood (Routine x 2)     Status: None (Preliminary result)   Collection Time: 02/09/18 11:52 AM  Result Value Ref Range Status   Specimen Description   Final    RIGHT ANTECUBITAL Performed at [facility], [address] [place], [place] [address]    Special Requests   Final    BOTTLES DRAWN AEROBIC AND ANAEROBIC Blood  Culture results may not be optimal due to an inadequate volume of blood received in culture bottles Performed at [facility], [address], [place], [place] [address]    Culture   Final    NO GROWTH 2 DAYS Performed at [facility], [address] [place], [place] [address]    Report Status PENDING  Incomplete  Urine culture     Status: None   Collection Time: 02/09/18  3:59 PM  Result Value Ref Range Status   Specimen Description   Final    URINE, CATHETERIZED Performed at [facility], [address], [place], [place] [address]    Special Requests   Final    NONE Performed at [facility], [address] [place], [place] [address]    Culture   Final    NO GROWTH Performed at [facility], [address] [place], [place] [address]    Report Status 02/10/2018 FINAL  Final         Radiology Studies: No results found.      Scheduled Meds: . acetaminophen  650 mg Oral Q6H  . aspirin EC  81 mg Oral Daily  . carvedilol  6.25 mg Oral BID  . clopidogrel  75 mg Oral Daily  . feeding supplement (PRO-STAT SUGAR FREE 64)  30 mL Oral TID BM  . heparin  5,000 Units Subcutaneous Q8H  . insulin aspart  0-5 Units Subcutaneous QHS  . insulin aspart  0-9 Units Subcutaneous TID WC  . levothyroxine  50 mcg Oral QAC breakfast  . lidocaine  1 patch Transdermal Q24H  . multivitamin with minerals  1 tablet Oral Daily  . potassium chloride  20 mEq Oral BID   Continuous Infusions: . lactated ringers 75 mL/hr at 02/12/18 0346     LOS: 3 days    Time spent: over 30 min    Lacretia Nicks, MD [facility] Pager [phone]   If 7PM-7AM, please contact night-coverage [email or web address] Password [email or web address] 02/12/2018, 11:36 AM

## 2018-02-12 NOTE — Progress Notes (Signed)
S: Feels better today O:BP (!) 157/54 (BP Location: Left Arm)   Pulse 80   Temp 98 F (36.7 C) (Oral)   Resp 18   Ht 5\' 5"  (1.651 m)   Wt 62.4 kg (137 lb 9.1 oz) Comment: scale b  SpO2 96%   BMI 22.89 kg/m   Intake/Output Summary (Last 24 hours) at 02/12/2018 1053 Last data filed at 02/12/2018 0946 Gross per 24 hour  Intake 1710.23 ml  Output 750 ml  Net 960.23 ml   Intake/Output: I/O last 3 completed shifts: In: 2498.6 [P.O.:840; I.V.:1546; IV Piggyback:112.6] Out: 1250 [Urine:1250]  Intake/Output this shift:  Total I/O In: 120 [P.O.:120] Out: 350 [Urine:350] Weight change: -1.104 kg (-2 lb 6.9 oz) Gen: Frail elderly AAM in NAD CVS: no rub Resp: cta Abd: benign Ext: no edema  Recent Labs  Lab 02/09/18 1147 02/10/18 0942 02/11/18 0744 02/12/18 0605  NA 140 143 144 144  K 5.3* 4.0 3.3* 3.1*  CL 102 101 99 102  CO2 11* 21* 27 28  GLUCOSE 282* 158* 105* 91  BUN 91* 91* 68* 47*  CREATININE 8.31* 6.37* 4.69* 3.36*  ALBUMIN 3.6  --   --   --   CALCIUM 8.5* 7.2* 7.2* 8.3*  AST 43*  --   --   --   ALT 27  --   --   --    Liver Function Tests: Recent Labs  Lab 02/09/18 1147  AST 43*  ALT 27  ALKPHOS 84  BILITOT 1.0  PROT 8.6*  ALBUMIN 3.6   No results for input(s): LIPASE, AMYLASE in the last 168 hours. No results for input(s): AMMONIA in the last 168 hours. CBC: Recent Labs  Lab 02/09/18 1147 02/10/18 0942 02/11/18 0744 02/12/18 0605  WBC 14.0* 12.3* 14.1* 11.1*  NEUTROABS 11.6  --   --   --   HGB 13.8 12.3 10.4* 10.9*  HCT 42.4 37.7 32.6* 34.5*  MCV 83.1 80.6 79.9 81.8  PLT PLATELET CLUMPS NOTED ON SMEAR, UNABLE TO ESTIMATE 168 184 177   Cardiac Enzymes: No results for input(s): CKTOTAL, CKMB, CKMBINDEX, TROPONINI in the last 168 hours. CBG: Recent Labs  Lab 02/11/18 0729 02/11/18 1134 02/11/18 1642 02/11/18 2112 02/12/18 0720  GLUCAP 103* 111* 127* 91 102*    Iron Studies: No results for input(s): IRON, TIBC, TRANSFERRIN, FERRITIN in  the last 72 hours. Studies/Results: No results found. Marland Kitchen acetaminophen  650 mg Oral Q6H  . aspirin EC  81 mg Oral Daily  . carvedilol  6.25 mg Oral BID  . clopidogrel  75 mg Oral Daily  . feeding supplement (PRO-STAT SUGAR FREE 64)  30 mL Oral TID BM  . heparin  5,000 Units Subcutaneous Q8H  . insulin aspart  0-5 Units Subcutaneous QHS  . insulin aspart  0-9 Units Subcutaneous TID WC  . levothyroxine  50 mcg Oral QAC breakfast  . lidocaine  1 patch Transdermal Q24H  . multivitamin with minerals  1 tablet Oral Daily    BMET    Component Value Date/Time   NA 144 02/12/2018 0605   K 3.1 (L) 02/12/2018 0605   CL 102 02/12/2018 0605   CO2 28 02/12/2018 0605   GLUCOSE 91 02/12/2018 0605   GLUCOSE 112 (H) 08/14/2006 1339   BUN 47 (H) 02/12/2018 0605   CREATININE 3.36 (H) 02/12/2018 0605   CREATININE 1.29 (H) 06/07/2016 1202   CALCIUM 8.3 (L) 02/12/2018 0605   GFRNONAA 12 (L) 02/12/2018 0605   GFRAA 14 (  L) 02/12/2018 0605   CBC    Component Value Date/Time   WBC 11.1 (H) 02/12/2018 0605   RBC 4.22 02/12/2018 0605   HGB 10.9 (L) 02/12/2018 0605   HCT 34.5 (L) 02/12/2018 0605   PLT 177 02/12/2018 0605   MCV 81.8 02/12/2018 0605   MCH 25.8 (L) 02/12/2018 0605   MCHC 31.6 02/12/2018 0605   RDW 14.9 02/12/2018 0605   LYMPHSABS 1.7 02/09/2018 1147   MONOABS 0.7 02/09/2018 1147   EOSABS 0.0 02/09/2018 1147   BASOSABS 0.0 02/09/2018 1147      Assessment/Plan:  1. AKI/CKD stage 3- in setting of UTI and volume depletion with concomitant ACE- inhibition and NSAID use as well as diuretics. Cr has improved with IVF's. Continue to follow. No indication for dialysis at this time.  1. Continue to hold ACE, lasix, and ibuprofen.   2. Pt is not a dialysis candidate given advanced age and poor functional, cognitive, and nutritional status. 2. Metabolic acidosis- due to #1. No metformin on her medlist. Improved with isotonic bicarb drip. 1. changed to NS  3. Hyperkalemia- due to  #1 and #2. Improved with isotonic bicarb now low will replete gently and follow.  4. DM- per primary 5. Anemia- possibly due to acute illness but will check iron stores and spep. 6. CHF EF 25%, will decrease rate of IVF's. 7. UTI- given one dose of rocephin, awaiting cultures 8. T12 fx- avoid NSAIDs 9. HTN- stable 10. PAD- followed by Dr. Allyson [name], has atrophic left kidney and may have had left renal artery stenosis contributing to her  CKD.   [name] Cords, MD [employer] [phone]

## 2018-02-12 NOTE — Clinical Social Work Note (Signed)
Clinical Social Work Assessment  Patient Details  Name: [name] [name] MRN: [id number] Date of Birth: May 04, 1934  Date of referral:  02/12/18               Reason for consult:  Facility Placement, Discharge Planning                Permission sought to share information with:  Family Supports Permission granted to share information::  Yes, Verbal Permission Granted  Name::     Toniah Wynkoop  Agency::     Relationship::  Son  Contact Information:  [phone]  Housing/Transportation Living arrangements for the past 2 months:  Single Family Home(Lives with son [name]) Source of Information:  Patient Patient Interpreter Needed:  None Criminal Activity/Legal Involvement Pertinent to Current Situation/Hospitalization:  No - Comment as needed Significant Relationships:  None Lives with:  Adult Children(Son [name]) Do you feel safe going back to the place where you live?  No(Patient agreeable to ST rehab) Need for family participation in patient care:  Yes (Comment)  Care giving concerns:  Patient cares for herself at home and is agreeable to ST rehab before returning home.   Social Worker assessment / plan:  CSW talked with patient at the bedside regarding discharge disposition and recommendation of ST rehab. Ms. Canete was sitting in a chair at bedside finishing up her lunch and was alert, oriented and agreeable to talking with CSW regarding her discharge plan. When asked patient indicated that she lives with her son Carmelia Roller, who was at the bedside and was able to take care of herself at home prior to admissions. Ms. Brozek reported that she has been to rehab before at [facility] and this facility would be her preference. CSW provided SNF list in case she is unable to d/c to her preferred facility.   Employment status:  Retired Health and safety inspector:  Medicare, Other (Comment Required)([facility]) PT Recommendations:  Skilled Nursing Facility Information / Referral to community  resources:  Skilled Nursing Facility(List provided)  Patient/Family's Response to care:  No concerns expressed by patient or son regarding her care during hospitalization.  Patient/Family's Understanding of and Emotional Response to Diagnosis, Current Treatment, and Prognosis:  Patient appeared to understand her need for rehab and is in agreement.  Emotional Assessment Appearance:  Appears stated age Attitude/Demeanor/Rapport:  Engaged Affect (typically observed):  Appropriate, Pleasant Orientation:  Oriented to Self, Oriented to Place, Oriented to  Time, Oriented to Situation Alcohol / Substance use:  Tobacco Use, Alcohol Use, Illicit Drugs(Patient reports that she quit smoking and does not drink or use illicit drugs) Psych involvement (Current and /or in the community):  No (Comment)  Discharge Needs  Concerns to be addressed:  Discharge Planning Concerns Readmission within the last 30 days:  No Current discharge risk:  None Barriers to Discharge:  Continued Medical Work up   [employer] Lazaro Arms, LCSW 02/12/2018, 2:14 PM

## 2018-02-13 ENCOUNTER — Encounter: Payer: Self-pay | Admitting: Internal Medicine

## 2018-02-13 LAB — COMPREHENSIVE METABOLIC PANEL
ALBUMIN: 2.5 g/dL — AB (ref 3.5–5.0)
ALK PHOS: 72 U/L (ref 38–126)
ALT: 34 U/L (ref 0–44)
AST: 55 U/L — AB (ref 15–41)
Anion gap: 10 (ref 5–15)
BUN: 30 mg/dL — ABNORMAL HIGH (ref 8–23)
CHLORIDE: 104 mmol/L (ref 98–111)
CO2: 26 mmol/L (ref 22–32)
CREATININE: 2.47 mg/dL — AB (ref 0.44–1.00)
Calcium: 8.6 mg/dL — ABNORMAL LOW (ref 8.9–10.3)
GFR calc Af Amer: 20 mL/min — ABNORMAL LOW (ref >60)
GFR calc non Af Amer: 17 mL/min — ABNORMAL LOW (ref >60)
Glucose, Bld: 166 mg/dL — ABNORMAL HIGH (ref 70–99)
Potassium: 3.5 mmol/L (ref 3.5–5.1)
Sodium: 140 mmol/L (ref 135–145)
Total Bilirubin: 0.7 mg/dL (ref 0.3–1.2)
Total Protein: 6.5 g/dL (ref 6.5–8.1)

## 2018-02-13 LAB — CBC
HEMATOCRIT: 33.7 % — AB (ref 36.0–46.0)
HEMOGLOBIN: 10.7 g/dL — AB (ref 12.0–15.0)
MCH: 26.2 pg (ref 26.0–34.0)
MCHC: 31.8 g/dL (ref 30.0–36.0)
MCV: 82.4 fL (ref 78.0–100.0)
Platelets: 165 10*3/uL (ref 150–400)
RBC: 4.09 MIL/uL (ref 3.87–5.11)
RDW: 14.9 % (ref 11.5–15.5)
WBC: 7.3 10*3/uL (ref 4.0–10.5)

## 2018-02-13 LAB — GLUCOSE, CAPILLARY
GLUCOSE-CAPILLARY: 161 mg/dL — AB (ref 70–99)
GLUCOSE-CAPILLARY: 170 mg/dL — AB (ref 70–99)
GLUCOSE-CAPILLARY: 136 mg/dL — AB (ref 70–99)
Glucose-Capillary: 164 mg/dL — ABNORMAL HIGH (ref 70–99)

## 2018-02-13 LAB — MAGNESIUM: Magnesium: 1.6 mg/dL — ABNORMAL LOW (ref 1.7–2.4)

## 2018-02-13 MED ORDER — POLYETHYLENE GLYCOL 3350 17 G PO PACK
17.0000 g | PACK | Freq: Every day | ORAL | Status: DC
  Administered 2018-02-14: 17 g via ORAL
  Filled 2018-02-13 (×2): qty 1

## 2018-02-13 MED ORDER — MAGNESIUM OXIDE 400 (241.3 MG) MG PO TABS
400.0000 mg | ORAL_TABLET | Freq: Every day | ORAL | Status: DC
  Administered 2018-02-13 – 2018-02-14 (×2): 400 mg via ORAL
  Filled 2018-02-13 (×2): qty 1

## 2018-02-13 MED ORDER — MAGNESIUM SULFATE IN D5W 1-5 GM/100ML-% IV SOLN: 1.0000 g | Freq: Once | INTRAVENOUS | Status: DC

## 2018-02-13 NOTE — NC FL2 (Signed)
[place] MEDICAID FL2 LEVEL OF CARE SCREENING TOOL     IDENTIFICATION  Patient Name: [name] [name] Birthdate: [birth year] Sex: female Admission Date (Current Location): 02/09/2018  [facility] and [place] Number:  Producer, television/film/video and Address:  The . [facility], [address], [place], [place] [address]      Provider Number: [id number]  Attending Physician Name and Address:  Zigmund Daniel., *  Relative Name and Phone Number:       Current Level of Care: Hospital Recommended Level of Care: Skilled Nursing Facility Prior Approval Number:    Date Approved/Denied:   PASRR Number: [id number]  Discharge Plan: SNF    Current Diagnoses: Patient Active Problem List   Diagnosis Date Noted  . AKI (acute kidney injury) (HCC) 02/09/2018  . Encounter for post-traumatic wound check 12/16/2016  . Critical lower limb ischemia 06/17/2016  . Diabetic neuropathy, painful (HCC) 04/24/2016  . Left carotid artery stenosis   . Hypertriglyceridemia, essential 01/10/2016  . 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 Type 2 diabetes mellitus with diabetic neuropathy, with long-term current use of insulin (HCC) 10/06/2015  . Deficiency anemia 03/10/2014  . Osteoporosis, unspecified 03/10/2014  . Left bundle branch block 07/01/2013  . Coronary atherosclerosis of native coronary artery 02/16/2013  . Chronic systolic heart failure (HCC) 08/16/2012  . Congestive dilated cardiomyopathy (HCC) 08/02/2012  . HTN (hypertension) 08/02/2012  . Familial tremor 11/29/2010  . VITAMIN D DEFICIENCY 04/04/2010  . Osteoarthrosis, unspecified whether generalized or localized, unspecified site 04/04/2010  . Hyperlipidemia with target LDL less than 70 01/19/2007  . Hypothyroidism 01/14/2007    Orientation RESPIRATION BLADDER Height & Weight     Self, Time, Situation, Place  Normal Continent, External catheter Weight: 137 lb 9.1 oz  (62.4 kg)(scale b) Height:  5\' 5"  (165.1 cm)  BEHAVIORAL SYMPTOMS/MOOD NEUROLOGICAL BOWEL NUTRITION STATUS  (None) (None) Incontinent Diet(Carb modified)  AMBULATORY STATUS COMMUNICATION OF NEEDS Skin   Extensive Assist Verbally Normal                       Personal Care Assistance Level of Assistance              Functional Limitations Info  Sight, Hearing, Speech Sight Info: Adequate Hearing Info: Adequate Speech Info: Adequate    SPECIAL CARE FACTORS FREQUENCY  PT (By licensed PT), Blood pressure     PT Frequency: 5 x week              Contractures Contractures Info: Not present    Additional Factors Info  Code Status, Allergies Code Status Info: Full Allergies Info: Statins, Metformin and related, Vytorin (Ezetimibe-simvastatin)           Current Medications (02/13/2018):  This is the current hospital active medication list Current Facility-Administered Medications  Medication Dose Route Frequency Provider Last Rate Last Dose  . acetaminophen (TYLENOL) tablet 650 mg  650 mg Oral Q6H Zigmund Daniel., MD   650 mg at 02/13/18 0945  . aspirin EC tablet 81 mg  81 mg Oral Daily Calvert Cantor, MD   81 mg at 02/13/18 0945  . carvedilol (COREG) tablet 6.25 mg  6.25 mg Oral BID Calvert Cantor, MD   6.25 mg at 02/13/18 0945  . clopidogrel (PLAVIX) tablet 75 mg  75 mg Oral Daily Calvert Cantor, MD   75 mg at 02/13/18 0945  . feeding supplement (PRO-STAT SUGAR FREE 64)  liquid 30 mL  30 mL Oral TID BM Zigmund Daniel., MD   30 mL at 02/12/18 2107  . heparin injection 5,000 Units  5,000 Units Subcutaneous Q8H Calvert Cantor, MD   5,000 Units at 02/13/18 0557  . insulin aspart (novoLOG) injection 0-5 Units  0-5 Units Subcutaneous QHS Calvert Cantor, MD   2 Units at 02/09/18 2132  . insulin aspart (novoLOG) injection 0-9 Units  0-9 Units Subcutaneous TID WC Calvert Cantor, MD   2 Units at 02/13/18 0811  . lactated ringers infusion   Intravenous Continuous Zigmund Daniel., MD 75 mL/hr at 02/13/18 [phone]    . levothyroxine (SYNTHROID, LEVOTHROID) tablet 50 mcg  50 mcg Oral QAC breakfast Calvert Cantor, MD   50 mcg at 02/13/18 0557  . lidocaine (LIDODERM) 5 % 1 patch  1 patch Transdermal Q24H Zigmund Daniel., MD   1 patch at 02/13/18 [phone]  . multivitamin with minerals tablet 1 tablet  1 tablet Oral Daily Zigmund Daniel., MD   1 tablet at 02/13/18 0945  . MUSCLE RUB CREA 1 application  1 application Topical PRN Tyrone Nine, MD   1 application at 02/13/18 [phone]  . ondansetron (ZOFRAN) tablet 4 mg  4 mg Oral Q6H PRN Calvert Cantor, MD       Or  . ondansetron (ZOFRAN) injection 4 mg  4 mg Intravenous Q6H PRN Calvert Cantor, MD   4 mg at 02/11/18 0834  . oxyCODONE (Oxy IR/ROXICODONE) immediate release tablet 2.5 mg  2.5 mg Oral Q6H PRN Zigmund Daniel., MD   2.5 mg at 02/13/18 0605     Discharge Medications: Please see discharge summary for a list of discharge medications.  Relevant Imaging Results:  Relevant Lab Results:   Additional Information SS#: [id number]  Margarito Liner, LCSW

## 2018-02-13 NOTE — Progress Notes (Signed)
PROGRESS NOTE    [name] [name]  ZOX:[id number] DOB: Aug 02, 1934 DOA: 02/09/2018 PCP: [name], MD   Brief Narrative:  [name] [name] is an 84 y.o. female with a history of chronic combined CHF, CAD, HTN, HLD, IDT2DM who presented to the ED for generalized weakness. She reportedly had been taking significant amounts of NSAIDs for pain related to back fractures following a fall at home, diagnosed in the ED 6/25. On arrival she had hyperkalemic (K 5.3) acute renal failure (BUN 91, Cr 8.31) with metabolic acidosis. Nephrology was consulted, IV fluids provided with improvement. She complains of some lower abdominal pain and has pyuria on urinalysis, so ceftriaxone was started pending urine culture.   Assessment & Plan:   Principal Problem:   AKI (acute kidney injury) (HCC) Active Problems:   Hypothyroidism   Hyperlipidemia with target LDL less than 70   Congestive dilated cardiomyopathy (HCC)   HTN (hypertension)   Type 2 diabetes mellitus with diabetic neuropathy, with long-term current use of insulin (HCC)  Goals of Care: Have c/s palliative care for [name] [name] for goals of care discussion with family if possible before time for discharge.  Pt seems to be improving from overall standpoint, but still poor PO intake (though ? Of some improvement with this) and several chronic medical problems.  GOC discussion with family would be valuable whether it happens in house or with palliative following after discharge.   Acute renal failure on stage III CKD: Based on presumed Cr baseline of 1.3-1.4. U/S with 10.5cm (R) and 7.8cm (L) both with atrophy without hydronephrosis.  - Creatinine peaked to 8.31, improving with IVF (2.47 today), baseline around 1.4 - Continue IVF's.  Acidemia resolved.   - Bicarb has improved, will switch to LR - Follow UOP (adequate) - Appreciate nephrology consulting, no indication for dialysis with improving renal function. Uremia improving (presumed cause of  encephalopathy).  Renal signing off, if renal function does not return to baseline, f/u with [facility] after d/c.  Hold ace, lasix, ibuprofen.  Recommending against restarting ACE. - Of course, avoid NSAIDs/contrast, etc.  Acute metabolic encephalopathy:  - Urine culture negative.  ?underlying dementia.  Also, uremia above could be contributing as well as acute pain.  Son notes that she's gradually improving, but not quite to baseline.   - improvement seems to be continuing today - normal TSH.  Follow B12 (wnl) and RPR (pending). - continue to monitor, supportive care - delirium prec  Poor PO intake: likely 2/2 above.   Ate 50% of meal this afternoon, better than previously noted.  Supplements per nutrition.  Loose Stools: none noted today   Chronic diastolic CHF: Echo from 2017 showed EF 55-60%, G1DD.  - Holding ACE and diuretic (no evidence of overload) - Monitor I/O closely with IVF's.   Symptomatic pyuria: Leukocytosis persistent. - No growth on culture - will d/c ceftriaxone  Compression fractures of T12, L1, L2 - Continue TLSO brace - PT evaluation ordered - Scheduled APAP, low dose oxycodone prn.  Lidocaine patch.  - Pain improved today  Hypothyroidism:  - TSH wnl - Continue synthroid  T2DM: Poorly controlled, HbA1c 8.8%.  - SSI while inpatient, previously on 70/30 and [facility], not on metformin as outpatient.  - BG appropriate   HTN:  - Continue coreg  Hyperlipidemia:  - Continue statin   History of stroke, cerebrovascular disease 2018:  - Continue ASA, plavix  Leukocytosis: follow  Hypokalemia: mild, follow with AKI  Hypomagnesemia: follow, replete  DVT prophylaxis: heparin  Code Status: full  Family Communication: none at bedside Disposition Plan: pending imrpovement, liekly SNF   Consultants:   renal  Procedures:   none  Antimicrobials:  Anti-infectives (From admission, onward)   Start     Dose/Rate Route Frequency Ordered Stop   02/09/18 1230   cefTRIAXone (ROCEPHIN) 2 g in sodium chloride 0.9 % 100 mL IVPB  Status:  Discontinued     2 g 200 mL/hr over 30 Minutes Intravenous Every 24 hours 02/09/18 1222 02/11/18 1608         Subjective: A&Ox3 today. Some back pain. Appetite "ok".  Objective: Vitals:   02/13/18 0404 02/13/18 0757 02/13/18 1200 02/13/18 1544  BP: (!) 148/62 132/60 (!) 137/59 (!) 149/65  Pulse: 61 62 60 60  Resp: 18 14 14 16   Temp: 98 F (36.7 C) 98.6 F (37 C) 98.4 F (36.9 C) 98.3 F (36.8 C)  TempSrc: Oral Oral Oral Oral  SpO2: 98% 94% 91% 97%  Weight: 62.4 kg (137 lb 9.1 oz)     Height:        Intake/Output Summary (Last 24 hours) at 02/13/2018 1925 Last data filed at 02/13/2018 1545 Gross per 24 hour  Intake 240 ml  Output 1550 ml  Net -1310 ml   Filed Weights   02/11/18 0605 02/12/18 0400 02/13/18 0404  Weight: 63.5 kg (140 lb) 62.4 kg (137 lb 9.1 oz) 62.4 kg (137 lb 9.1 oz)    Examination:  General: No acute distress. Cardiovascular: Heart sounds show a regular rate, and rhythm. No gallops or rubs. No murmurs. No JVD. Lungs: Clear to auscultation bilaterally with good air movement. No rales, rhonchi or wheezes. Abdomen: Soft, nontender, nondistended with normal active bowel sounds.  Neurological: Alert and oriented 3. Moves all extremities 4. Cranial nerves II through XII grossly intact. Skin: Warm and dry. No rashes or lesions. Extremities: No clubbing or cyanosis. No edema.  Psychiatric: Mood and affect are normal. Insight and judgment are appropriate.    Data Reviewed: I have personally reviewed following labs and imaging studies  CBC: Recent Labs  Lab 02/09/18 1147 02/10/18 0942 02/11/18 0744 02/12/18 0605 02/13/18 0513  WBC 14.0* 12.3* 14.1* 11.1* 7.3  NEUTROABS 11.6  --   --   --   --   HGB 13.8 12.3 10.4* 10.9* 10.7*  HCT 42.4 37.7 32.6* 34.5* 33.7*  MCV 83.1 80.6 79.9 81.8 82.4  PLT PLATELET CLUMPS NOTED ON SMEAR, UNABLE TO ESTIMATE 168 184 177 165   Basic  Metabolic Panel: Recent Labs  Lab 02/09/18 1147 02/10/18 0942 02/11/18 0744 02/12/18 0605 02/13/18 0513  NA 140 143 144 144 140  K 5.3* 4.0 3.3* 3.1* 3.5  CL 102 101 99 102 104  CO2 11* 21* 27 28 26   GLUCOSE 282* 158* 105* 91 166*  BUN 91* 91* 68* 47* 30*  CREATININE 8.31* 6.37* 4.69* 3.36* 2.47*  CALCIUM 8.5* 7.2* 7.2* 8.3* 8.6*  MG  --   --  2.1 1.9 1.6*   GFR: Estimated Creatinine Clearance: 16.1 mL/min (A) (by C-G formula based on SCr of 2.47 mg/dL (H)). Liver Function Tests: Recent Labs  Lab 02/09/18 1147 02/13/18 0513  AST 43* 55*  ALT 27 34  ALKPHOS 84 72  BILITOT 1.0 0.7  PROT 8.6* 6.5  ALBUMIN 3.6 2.5*   No results for input(s): LIPASE, AMYLASE in the last 168 hours. No results for input(s): AMMONIA in the last 168 hours. Coagulation Profile: Recent Labs  Lab 02/09/18 1147  INR  1.15   Cardiac Enzymes: No results for input(s): CKTOTAL, CKMB, CKMBINDEX, TROPONINI in the last 168 hours. BNP (last 3 results) No results for input(s): PROBNP in the last 8760 hours. HbA1C: No results for input(s): HGBA1C in the last 72 hours. CBG: Recent Labs  Lab 02/12/18 1622 02/12/18 2048 02/13/18 0720 02/13/18 1130 02/13/18 1630  GLUCAP 155* 196* 164* 161* 170*   Lipid Profile: No results for input(s): CHOL, HDL, LDLCALC, TRIG, CHOLHDL, LDLDIRECT in the last 72 hours. Thyroid Function Tests: Recent Labs    02/11/18 0544  TSH 0.463   Anemia Panel: Recent Labs    02/12/18 0605  VITAMINB12 495   Sepsis Labs: Recent Labs  Lab 02/09/18 1206 02/09/18 1556  LATICACIDVEN 2.38* 0.98    Recent Results (from the past 240 hour(s))  Culture, blood (Routine x 2)     Status: None (Preliminary result)   Collection Time: 02/09/18 11:47 AM  Result Value Ref Range Status   Specimen Description   Final    LEFT ANTECUBITAL Performed at [facility], [address] [place], [place] [address]    Special Requests   Final    BOTTLES DRAWN AEROBIC  AND ANAEROBIC Blood Culture adequate volume Performed at [facility], [address] [place], [place] [address]    Culture   Final    NO GROWTH 4 DAYS Performed at [facility], [address], [place], [place] [address]    Report Status PENDING  Incomplete  Culture, blood (Routine x 2)     Status: None (Preliminary result)   Collection Time: 02/09/18 11:52 AM  Result Value Ref Range Status   Specimen Description   Final    RIGHT ANTECUBITAL Performed at [facility], [address], [place], [place] [address]    Special Requests   Final    BOTTLES DRAWN AEROBIC AND ANAEROBIC Blood Culture results may not be optimal due to an inadequate volume of blood received in culture bottles Performed at [facility], [address], [place], [place] [address]    Culture   Final    NO GROWTH 4 DAYS Performed at [facility], [address], [place], [place] [address]    Report Status PENDING  Incomplete  Urine culture     Status: None   Collection Time: 02/09/18  3:59 PM  Result Value Ref Range Status   Specimen Description   Final    URINE, CATHETERIZED Performed at [facility], [address] [place], [place] [address]    Special Requests   Final    NONE Performed at [facility], [address] [place], [place] [address]    Culture   Final    NO GROWTH Performed at [facility], [address] [place], [place] [address]    Report Status 02/10/2018 FINAL  Final         Radiology Studies: No results found.      Scheduled Meds: . acetaminophen  650 mg Oral Q6H  . aspirin EC  81 mg Oral Daily  . carvedilol  6.25 mg Oral BID  . clopidogrel  75 mg Oral Daily  . feeding supplement (PRO-STAT SUGAR FREE 64)  30 mL Oral TID BM  . heparin  5,000 Units Subcutaneous Q8H  . insulin aspart  0-5 Units Subcutaneous QHS  . insulin aspart  0-9 Units Subcutaneous TID  WC  . levothyroxine  50 mcg Oral  QAC breakfast  . lidocaine  1 patch Transdermal Q24H  . multivitamin with minerals  1 tablet Oral Daily  . polyethylene glycol  17 g Oral Daily   Continuous Infusions: . lactated ringers 75 mL/hr at 02/13/18 0816     LOS: 4 days    Time spent: over 30 min    Lacretia Nicks, MD [facility] Pager [phone]   If 7PM-7AM, please contact night-coverage [email or web address] Password [email or web address] 02/13/2018, 7:25 PM

## 2018-02-13 NOTE — Clinical Social Work Note (Addendum)
[facility] extended a bed offer and patient accepted. CSW paged MD to see if there is an anticipated discharge date.  Charlynn Court, CSW [phone]  2:12 pm Per MD, patient will be a weekend discharge. SNF notified and are agreeable.  Charlynn Court, CSW [phone]

## 2018-02-13 NOTE — Progress Notes (Signed)
Physical Therapy Treatment Patient Details Name: [name] MRN: [id number] DOB: 11/02/1933 Today's Date: 02/13/2018    History of Present Illness [name] [name] is a 84 y.o. female with medical history of chronic diastolic and systolic CHF, CAD, HTN, HLD, IDDM2 who comes in for generalized weakness. Family is not present and history is obtained from ED physician. The patient is found to have a UTI and AKI. She fell recently and was seen in the ED on 6/25 has back fractures and has been receiving NSAIDs.     PT Comments    Patient progressing with therapy this visit. Cognition improving, now AOx4 and responding appropriate. Less impulsive with mobility. Still requiring hands on assistance for safe transfers, due to pain pt requests just to transfer to bedside chair.  Will progress to gait training next visit with chair follow for safety. SNF recs still appropriate at this time.    Follow Up Recommendations  SNF;Supervision/Assistance - 24 hour     Equipment Recommendations       Recommendations for Other Services       Precautions / Restrictions Precautions Precautions: Fall Restrictions Weight Bearing Restrictions: No    Mobility  Bed Mobility Overal bed mobility: Needs Assistance Bed Mobility: Supine to Sit     Supine to sit: Supervision        Transfers Overall transfer level: Needs assistance Equipment used: Rolling walker (2 wheeled) Transfers: Stand Pivot Transfers;Sit to/from Stand Sit to Stand: Min assist Stand pivot transfers: Min assist       General transfer comment: Min A to power up, min a for steadying during transfer to chair. Patient limited by back pain wishes to sit denies wanting to walk although suspect she could ambulate limited distances, will re-attempt next visit.  Ambulation/Gait             General Gait Details: defered 2/2 pt requests   Stairs             Wheelchair Mobility    Modified Rankin (Stroke Patients Only)       Balance Overall balance assessment: Needs assistance   Sitting balance-Leahy Scale: Fair       Standing balance-Leahy Scale: Poor                              Cognition Arousal/Alertness: Awake/alert Behavior During Therapy: Flat affect Overall Cognitive Status: No family/caregiver present to determine baseline cognitive functioning                                 General Comments: improving cognition AOx4 this visit, following commands 100% of time. still with memory and slow processing.      Exercises      General Comments        Pertinent Vitals/Pain Pain Assessment: Faces Faces Pain Scale: Hurts even more Pain Location: back Pain Descriptors / Indicators: Grimacing Pain Intervention(s): Limited activity within patient's tolerance;Monitored during session;Premedicated before session    Home Living                      Prior Function            PT Goals (current goals can now be found in the care plan section) Acute Rehab PT Goals Patient Stated Goal: non stated PT Goal Formulation: With patient Time For Goal Achievement: 02/18/18 Potential to Achieve Goals: Good  Progress towards PT goals: Progressing toward goals    Frequency    Min 3X/week      PT Plan Current plan remains appropriate    Co-evaluation              AM-PAC PT "6 Clicks" Daily Activity  Outcome Measure  Difficulty turning over in bed (including adjusting bedclothes, sheets and blankets)?: A Little Difficulty moving from lying on back to sitting on the side of the bed? : A Little Difficulty sitting down on and standing up from a chair with arms (e.g., wheelchair, bedside commode, etc,.)?: A Little Help needed moving to and from a bed to chair (including a wheelchair)?: A Little Help needed walking in hospital room?: A Lot Help needed climbing 3-5 steps with a railing? : Total 6 Click Score: 15    End of Session Equipment Utilized  During Treatment: Gait belt Activity Tolerance: Patient limited by pain Patient left: in chair;with call bell/phone within reach;with nursing/sitter in room;with chair alarm set Nurse Communication: Mobility status PT Visit Diagnosis: Unsteadiness on feet (R26.81);Muscle weakness (generalized) (M62.81);History of falling (Z91.81);Pain Pain - part of body: (back)     Time: 1610-9604 PT Time Calculation (min) (ACUTE ONLY): 17 min  Charges:  $Therapeutic Activity: 8-22 mins                    G Codes:       [name], PT, DPT Acute Rehab Services Pager: [phone]     [name] 02/13/2018, 1:15 PM

## 2018-02-13 NOTE — Progress Notes (Signed)
S: "I feel better" O:BP (!) 137/59 (BP Location: Left Arm)   Pulse 60   Temp 98.4 F (36.9 C) (Oral)   Resp 14   Ht 5\' 5"  (1.651 m)   Wt 62.4 kg (137 lb 9.1 oz) Comment: scale b  SpO2 91%   BMI 22.89 kg/m   Intake/Output Summary (Last 24 hours) at 02/13/2018 1317 Last data filed at 02/13/2018 1000 Gross per 24 hour  Intake 120 ml  Output 1150 ml  Net -1030 ml   Intake/Output: I/O last 3 completed shifts: In: 803.9 [P.O.:360; I.V.:443.9] Out: 1650 [Urine:1650]  Intake/Output this shift:  Total I/O In: 120 [P.O.:120] Out: 250 [Urine:250] Weight change: 0 kg (0 lb) Gen: elderly, frail AAF in NAD CVS: no rub Resp: cta Abd: benign Ext: no edema  Recent Labs  Lab 02/09/18 1147 02/10/18 0942 02/11/18 0744 02/12/18 0605 02/13/18 0513  NA 140 143 144 144 140  K 5.3* 4.0 3.3* 3.1* 3.5  CL 102 101 99 102 104  CO2 11* 21* 27 28 26   GLUCOSE 282* 158* 105* 91 166*  BUN 91* 91* 68* 47* 30*  CREATININE 8.31* 6.37* 4.69* 3.36* 2.47*  ALBUMIN 3.6  --   --   --  2.5*  CALCIUM 8.5* 7.2* 7.2* 8.3* 8.6*  AST 43*  --   --   --  55*  ALT 27  --   --   --  34   Liver Function Tests: Recent Labs  Lab 02/09/18 1147 02/13/18 0513  AST 43* 55*  ALT 27 34  ALKPHOS 84 72  BILITOT 1.0 0.7  PROT 8.6* 6.5  ALBUMIN 3.6 2.5*   No results for input(s): LIPASE, AMYLASE in the last 168 hours. No results for input(s): AMMONIA in the last 168 hours. CBC: Recent Labs  Lab 02/09/18 1147 02/10/18 0942 02/11/18 0744 02/12/18 0605 02/13/18 0513  WBC 14.0* 12.3* 14.1* 11.1* 7.3  NEUTROABS 11.6  --   --   --   --   HGB 13.8 12.3 10.4* 10.9* 10.7*  HCT 42.4 37.7 32.6* 34.5* 33.7*  MCV 83.1 80.6 79.9 81.8 82.4  PLT PLATELET CLUMPS NOTED ON SMEAR, UNABLE TO ESTIMATE 168 184 177 165   Cardiac Enzymes: No results for input(s): CKTOTAL, CKMB, CKMBINDEX, TROPONINI in the last 168 hours. CBG: Recent Labs  Lab 02/12/18 1137 02/12/18 1622 02/12/18 2048 02/13/18 0720 02/13/18 1130   GLUCAP 161* 155* 196* 164* 161*    Iron Studies: No results for input(s): IRON, TIBC, TRANSFERRIN, FERRITIN in the last 72 hours. Studies/Results: No results found. Marland Kitchen acetaminophen  650 mg Oral Q6H  . aspirin EC  81 mg Oral Daily  . carvedilol  6.25 mg Oral BID  . clopidogrel  75 mg Oral Daily  . feeding supplement (PRO-STAT SUGAR FREE 64)  30 mL Oral TID BM  . heparin  5,000 Units Subcutaneous Q8H  . insulin aspart  0-5 Units Subcutaneous QHS  . insulin aspart  0-9 Units Subcutaneous TID WC  . levothyroxine  50 mcg Oral QAC breakfast  . lidocaine  1 patch Transdermal Q24H  . multivitamin with minerals  1 tablet Oral Daily  . polyethylene glycol  17 g Oral Daily    BMET    Component Value Date/Time   NA 140 02/13/2018 0513   K 3.5 02/13/2018 0513   CL 104 02/13/2018 0513   CO2 26 02/13/2018 0513   GLUCOSE 166 (H) 02/13/2018 0513   GLUCOSE 112 (H) 08/14/2006 1339   BUN  30 (H) 02/13/2018 0513   CREATININE 2.47 (H) 02/13/2018 0513   CREATININE 1.29 (H) 06/07/2016 1202   CALCIUM 8.6 (L) 02/13/2018 0513   GFRNONAA 17 (L) 02/13/2018 0513   GFRAA 20 (L) 02/13/2018 0513   CBC    Component Value Date/Time   WBC 7.3 02/13/2018 0513   RBC 4.09 02/13/2018 0513   HGB 10.7 (L) 02/13/2018 0513   HCT 33.7 (L) 02/13/2018 0513   PLT 165 02/13/2018 0513   MCV 82.4 02/13/2018 0513   MCH 26.2 02/13/2018 0513   MCHC 31.8 02/13/2018 0513   RDW 14.9 02/13/2018 0513   LYMPHSABS 1.7 02/09/2018 1147   MONOABS 0.7 02/09/2018 1147   EOSABS 0.0 02/09/2018 1147   BASOSABS 0.0 02/09/2018 1147     Assessment/Plan:  1. AKI/CKD stage 3- in setting of UTI and volume depletion with concomitant ACE- inhibition and NSAID use as well as diuretics. Cr has improved with IVF's. Continue to follow. No indication for dialysis at this time.  1. Continue to hold ACE, lasix, and ibuprofen and would not restart ACE given risks outweigh benefits in this edlerly, frail woman. 2. Pt is not a dialysis  candidate given advanced age and poor functional, cognitive, and nutritional status. 3. Nothing further to add, will sign off.  Please call with questions or concerns.  If renal function does not return to baseline, can f/u in our office after discharge. 2. Metabolic acidosis- due to #1. No metformin on her medlist. Improved with isotonic bicarb drip. 1. changed to NS  3. Hyperkalemia- due to #1 and #2. Improved with isotonic bicarb now low will replete gently and follow.  4. DM- per primary 5. Anemia- possibly due to acute illness but will check iron stores and spep. 6. CHF EF 25%, will decrease rate of IVF's. 7. UTI- given one dose of rocephin, awaiting cultures 8. T12 fx- avoid NSAIDs 9. HTN- stable 10. PAD- followed by Dr. Allyson Sabal, has atrophic left kidney and may have had left renal artery stenosiscontributing to her CKD.  [name], MD [employer] [phone]

## 2018-02-13 NOTE — Clinical Social Work Placement (Signed)
   CLINICAL SOCIAL WORK PLACEMENT  NOTE  Date:  02/13/2018  Patient Details  Name: [name] [name] MRN: [id number] Date of Birth: 01-15-1934  Clinical Social Work is seeking post-discharge placement for this patient at the Skilled  Nursing Facility level of care (*CSW will initial, date and re-position this form in  chart as items are completed):  Yes   Patient/family provided with [facility] Clinical Social Work Department's list of facilities offering this level of care within the geographic area requested by the patient (or if unable, by the patient's family).  Yes   Patient/family informed of their freedom to choose among providers that offer the needed level of care, that participate in Medicare, Medicaid or managed care program needed by the patient, have an available bed and are willing to accept the patient.  Yes   Patient/family informed of [facility]'s ownership interest in [facility] and [facility] as well as of the fact that they are under no obligation to receive care at these facilities.  PASRR submitted to EDS on 02/13/18     PASRR number received on       Existing PASRR number confirmed on 02/13/18     FL2 transmitted to all facilities in geographic area requested by pt/family on 02/13/18     FL2 transmitted to all facilities within larger geographic area on       Patient informed that his/her managed care company has contracts with or will negotiate with certain facilities, including the following:            Patient/family informed of bed offers received.  Patient chooses bed at       Physician recommends and patient chooses bed at      Patient to be transferred to   on  .  Patient to be transferred to facility by       Patient family notified on   of transfer.  Name of family member notified:        PHYSICIAN Please sign FL2     Additional Comment:    _______________________________________________ Margarito Liner, LCSW 02/13/2018, 12:04  PM

## 2018-02-14 DIAGNOSIS — S22080A Wedge compression fracture of T11-T12 vertebra, initial encounter for closed fracture: Secondary | ICD-10-CM | POA: Diagnosis not present

## 2018-02-14 DIAGNOSIS — S32029D Unspecified fracture of second lumbar vertebra, subsequent encounter for fracture with routine healing: Secondary | ICD-10-CM | POA: Diagnosis not present

## 2018-02-14 DIAGNOSIS — E1140 Type 2 diabetes mellitus with diabetic neuropathy, unspecified: Secondary | ICD-10-CM | POA: Diagnosis not present

## 2018-02-14 DIAGNOSIS — M2550 Pain in unspecified joint: Secondary | ICD-10-CM | POA: Diagnosis not present

## 2018-02-14 DIAGNOSIS — E119 Type 2 diabetes mellitus without complications: Secondary | ICD-10-CM | POA: Diagnosis not present

## 2018-02-14 DIAGNOSIS — S32010D Wedge compression fracture of first lumbar vertebra, subsequent encounter for fracture with routine healing: Secondary | ICD-10-CM | POA: Diagnosis not present

## 2018-02-14 DIAGNOSIS — N170 Acute kidney failure with tubular necrosis: Secondary | ICD-10-CM | POA: Diagnosis not present

## 2018-02-14 DIAGNOSIS — N390 Urinary tract infection, site not specified: Secondary | ICD-10-CM | POA: Diagnosis not present

## 2018-02-14 DIAGNOSIS — I2510 Atherosclerotic heart disease of native coronary artery without angina pectoris: Secondary | ICD-10-CM | POA: Diagnosis not present

## 2018-02-14 DIAGNOSIS — R488 Other symbolic dysfunctions: Secondary | ICD-10-CM | POA: Diagnosis not present

## 2018-02-14 DIAGNOSIS — N179 Acute kidney failure, unspecified: Secondary | ICD-10-CM | POA: Diagnosis not present

## 2018-02-14 DIAGNOSIS — S22089D Unspecified fracture of T11-T12 vertebra, subsequent encounter for fracture with routine healing: Secondary | ICD-10-CM | POA: Diagnosis not present

## 2018-02-14 DIAGNOSIS — Z794 Long term (current) use of insulin: Secondary | ICD-10-CM | POA: Diagnosis not present

## 2018-02-14 DIAGNOSIS — Z7401 Bed confinement status: Secondary | ICD-10-CM | POA: Diagnosis not present

## 2018-02-14 DIAGNOSIS — E039 Hypothyroidism, unspecified: Secondary | ICD-10-CM | POA: Diagnosis not present

## 2018-02-14 DIAGNOSIS — S32020D Wedge compression fracture of second lumbar vertebra, subsequent encounter for fracture with routine healing: Secondary | ICD-10-CM | POA: Diagnosis not present

## 2018-02-14 DIAGNOSIS — I10 Essential (primary) hypertension: Secondary | ICD-10-CM | POA: Diagnosis not present

## 2018-02-14 DIAGNOSIS — Z8673 Personal history of transient ischemic attack (TIA), and cerebral infarction without residual deficits: Secondary | ICD-10-CM | POA: Diagnosis not present

## 2018-02-14 DIAGNOSIS — I5032 Chronic diastolic (congestive) heart failure: Secondary | ICD-10-CM | POA: Diagnosis not present

## 2018-02-14 DIAGNOSIS — N183 Chronic kidney disease, stage 3 (moderate): Secondary | ICD-10-CM | POA: Diagnosis not present

## 2018-02-14 DIAGNOSIS — E785 Hyperlipidemia, unspecified: Secondary | ICD-10-CM | POA: Diagnosis not present

## 2018-02-14 DIAGNOSIS — S32019D Unspecified fracture of first lumbar vertebra, subsequent encounter for fracture with routine healing: Secondary | ICD-10-CM | POA: Diagnosis not present

## 2018-02-14 DIAGNOSIS — R262 Difficulty in walking, not elsewhere classified: Secondary | ICD-10-CM | POA: Diagnosis not present

## 2018-02-14 DIAGNOSIS — I129 Hypertensive chronic kidney disease with stage 1 through stage 4 chronic kidney disease, or unspecified chronic kidney disease: Secondary | ICD-10-CM | POA: Diagnosis not present

## 2018-02-14 DIAGNOSIS — M6281 Muscle weakness (generalized): Secondary | ICD-10-CM | POA: Diagnosis not present

## 2018-02-14 DIAGNOSIS — R1312 Dysphagia, oropharyngeal phase: Secondary | ICD-10-CM | POA: Diagnosis not present

## 2018-02-14 DIAGNOSIS — G9341 Metabolic encephalopathy: Secondary | ICD-10-CM | POA: Diagnosis not present

## 2018-02-14 LAB — CBC
HCT: 34.4 % — ABNORMAL LOW (ref 36.0–46.0)
HEMOGLOBIN: 10.8 g/dL — AB (ref 12.0–15.0)
MCH: 26 pg (ref 26.0–34.0)
MCHC: 31.4 g/dL (ref 30.0–36.0)
MCV: 82.7 fL (ref 78.0–100.0)
Platelets: 175 10*3/uL (ref 150–400)
RBC: 4.16 MIL/uL (ref 3.87–5.11)
RDW: 14.9 % (ref 11.5–15.5)
WBC: 6.5 10*3/uL (ref 4.0–10.5)

## 2018-02-14 LAB — CULTURE, BLOOD (ROUTINE X 2)
Culture: NO GROWTH
Culture: NO GROWTH
SPECIAL REQUESTS: ADEQUATE

## 2018-02-14 LAB — BASIC METABOLIC PANEL
ANION GAP: 7 (ref 5–15)
BUN: 19 mg/dL (ref 8–23)
CHLORIDE: 106 mmol/L (ref 98–111)
CO2: 27 mmol/L (ref 22–32)
CREATININE: 2.01 mg/dL — AB (ref 0.44–1.00)
Calcium: 8.4 mg/dL — ABNORMAL LOW (ref 8.9–10.3)
GFR calc non Af Amer: 22 mL/min — ABNORMAL LOW (ref >60)
GFR, EST AFRICAN AMERICAN: 26 mL/min — AB (ref >60)
Glucose, Bld: 120 mg/dL — ABNORMAL HIGH (ref 70–99)
POTASSIUM: 3.5 mmol/L (ref 3.5–5.1)
SODIUM: 140 mmol/L (ref 135–145)

## 2018-02-14 LAB — GLUCOSE, CAPILLARY
GLUCOSE-CAPILLARY: 148 mg/dL — AB (ref 70–99)
GLUCOSE-CAPILLARY: 220 mg/dL — AB (ref 70–99)

## 2018-02-14 LAB — RPR: RPR Ser Ql: NONREACTIVE

## 2018-02-14 LAB — MAGNESIUM: MAGNESIUM: 1.5 mg/dL — AB (ref 1.7–2.4)

## 2018-02-14 MED ORDER — INSULIN ASPART 100 UNIT/ML ~~LOC~~ SOLN: 0.0000 [IU] | Freq: Three times a day (TID) | SUBCUTANEOUS | 0 refills | Status: AC

## 2018-02-14 MED ORDER — OXYCODONE HCL 5 MG PO TABS: 2.5000 mg | ORAL_TABLET | Freq: Four times a day (QID) | ORAL | 0 refills | Status: AC | PRN

## 2018-02-14 MED ORDER — PRO-STAT SUGAR FREE PO LIQD: 30.0000 mL | Freq: Three times a day (TID) | ORAL | 0 refills | Status: DC

## 2018-02-14 MED ORDER — LIDOCAINE 5 % EX PTCH: 1.0000 | MEDICATED_PATCH | CUTANEOUS | 0 refills | Status: DC

## 2018-02-14 MED ORDER — ADULT MULTIVITAMIN W/MINERALS CH: 1.0000 | ORAL_TABLET | Freq: Every day | ORAL | 0 refills | Status: AC

## 2018-02-14 MED ORDER — MAGNESIUM OXIDE 400 (241.3 MG) MG PO TABS: 400.0000 mg | ORAL_TABLET | Freq: Every day | ORAL | 0 refills | Status: AC

## 2018-02-14 MED ORDER — ACETAMINOPHEN 325 MG PO TABS: 650.0000 mg | ORAL_TABLET | Freq: Four times a day (QID) | ORAL | 0 refills | Status: DC | PRN

## 2018-02-14 NOTE — Progress Notes (Signed)
Clinical Social Worker facilitated patient discharge including contacting patient family (Son [name] [name])  and facility [facility]) to confirm patient discharge plans.  Clinical information faxed to facility and family agreeable with plan.  CSW arranged ambulance transport via [facility] to [facility] .  RN to call for report prior to discharge [phone] Room 503.  Clinical Social Worker will sign off for now as social work intervention is no longer needed. Please consult [place] again if new need arises.  [name] [name] LCSWA [phone]

## 2018-02-14 NOTE — Discharge Summary (Signed)
Physician Discharge Summary  [name] [name] ZOX:[id number] DOB: 14-Jan-1934 DOA: 02/09/2018  PCP: [name] Grandchild, MD  Admit date: 02/09/2018 Discharge date: 02/14/2018  Time spent: 40 minutes  Recommendations for Outpatient Follow-up:  1. Follow up outpatient CBC/BMP (follow BMP at q3 days until renal function stable)  2. Follow appetite, improving at discharge 3. Follow blood sugars, discontinued pt's 70/30 and glipizide at discharge as pt not requiring much sliding scale coverage.  Continue to follow ACHS blood sugars and provide sliding scale insulin as noted below. 4. Stop lisinopril indefinitely per nephrology.  Stop all NSAIDs.  Would discontinue lasix until follow up with PCP.  Follow volume status and creatinine closely.  If renal function does not return to baseline (~1.4), would refer to nephrology as outpatient 5. Follow up with neurosurgery for compression fractures.  Continue TLSO brace.  Tylenol, oxycodone, and lidocaine patch ordered for pain. 6. Would have palliative follow at SNF for goals of care conversation  Sliding Scale CBG < 70: implement hypoglycemia protocol CBG 70 - 120: 0 units CBG 121 - 150: 1 unit CBG 151 - 200: 2 units CBG 201 - 250: 3 units CBG 251 - 300: 5 units CBG 301 - 350: 7 units CBG 351 - 400: 9 units CBG > 400: call MD and obtain STAT lab verification  Discharge Diagnoses:  Principal Problem:   AKI (acute kidney injury) (HCC) Active Problems:   Hypothyroidism   Hyperlipidemia with target LDL less than 70   Congestive dilated cardiomyopathy (HCC)   HTN (hypertension)   Type 2 diabetes mellitus with diabetic neuropathy, with long-term current use of insulin (HCC)   Discharge Condition: stable  Diet recommendation: heart healthy  Filed Weights   02/12/18 0400 02/13/18 0404 02/14/18 0459  Weight: 62.4 kg (137 lb 9.1 oz) 62.4 kg (137 lb 9.1 oz) 62.1 kg (136 lb 14.4 oz)    History of present illness:  Per HPI [name] [name] an83  y.o.femalewith a history of chronic combined CHF, CAD, HTN, HLD, IDT2DM who presented to the ED for generalized weakness. She reportedly had been taking significant amounts of NSAIDs for pain related to back fractures following a fall at home, diagnosed in the ED 6/25. On arrival she had hyperkalemic (K 5.3) acute renal failure (BUN 91, Cr 8.31) with metabolic acidosis. Nephrology was consulted, IV fluids provided with improvement. She complains of some lower abdominal pain and has pyuria on urinalysis, so ceftriaxone was started pending urine culture.  She was admitted for AKI and acute encephalopathy.  It was assumed her AKI was in the setting of dehydration, NSAIDs, diuretics, ACE.  Nephrology was c/s and recommended IVF.  Her renal function improved with supportive care.  Her confusion gradually improved over the course of the hospitalization.  She received 3 days of ceftriaxone for possible UTI, though urine culture was negative.  Suspect possible component of uremia?  Her appetite was poor during hospitalization, but improving on discharge, continue to follow.   See below for additional details Hospital Course:  Acute renal failure on stage III CKD: Based on presumed Cr baseline of 1.3-1.4. U/S with 10.5cm (R) and 7.8cm (L) both with atrophy without hydronephrosis.  - Creatinine peaked to 8.31, improving with IVF (2.01 on day of discharge), baseline around 1.4 - Follow UOP (adequate) - Appreciate nephrology consulting, no indication for dialysis with improving renal function. Uremia improving (presumed cause of encephalopathy).  Renal signing off, if renal function does not return to baseline, f/u with [facility] after d/c.  Hold ace, lasix, ibuprofen.  Recommending against restarting ACE. - Of course, avoid NSAIDs/contrast, etc.  Acute metabolic encephalopathy:  - Urine culture negative.  ?underlying dementia.  Also, uremia above could be contributing as well as acute pain. She's continued to  improve.  A&Ox3 today, pleasant. - improvement seems to be continuing today - normal TSH.  Follow B12 (wnl) and RPR (negative). - continue to monitor, supportive care - delirium prec  Poor PO intake: likely 2/2 above.   Ate 50% of meal yesterday afternoon and this morning, improving, continue to follow outpatient, continue nutritional supplements.  Chronic diastolic CHF: Echo from 2017 showed EF 55-60%, G1DD.  - Holding ACE and diuretic (no evidence of overload) - Monitor I/O closely with IVF's.   Symptomatic pyuria: Leukocytosis persistent. - No growth on culture - will d/c ceftriaxone  Compression fractures of T12, L1, L2 - Continue TLSO brace - PT evaluation ordered (SNF) - APAP, low dose oxycodone prn.  Lidocaine patch.  - Pain improved  - follow up with neurosurgery as outpatient  Hypothyroidism:  - TSH wnl - Continue synthroid  T2DM: Poorly controlled, HbA1c 8.8%.  - SSI while inpatient, previously on 70/30 and [facility], not on metformin as outpatient.  - BG appropriate  HTN:  - Continue coreg  Hyperlipidemia:  - Continue statin   History of stroke, cerebrovascular disease 2018:  - Continue ASA, plavix  Leukocytosis: follow  Hypokalemia: mild, follow with AKI  Hypomagnesemia: follow, replete  Goals of care: recommended palliative to follow at SNF for goals of care conversation.     Procedures:  none (  Consultations:  nephrology  Discharge Exam: Vitals:   02/14/18 0459 02/14/18 1158  BP: (!) 147/73 (!) 125/57  Pulse: 63 64  Resp: 18 20  Temp: 98.1 F (36.7 C) 98.5 F (36.9 C)  SpO2: 98% 91%   Pleasant.  A&Ox3.  Pain is better.  Doing well. Son and daughter in law present.   General: No acute distress. Cardiovascular: Heart sounds show a regular rate, and rhythm.  Lungs: Clear to auscultation bilaterally  Abdomen: Soft, nontender, nondistended with normal active bowel sounds. Neurological: Alert and oriented 3. Moves all  extremities 4. Cranial nerves II through XII grossly intact. Skin: Warm and dry. No rashes or lesions. Extremities: No clubbing or cyanosis. No edema.  Psychiatric: Mood and affect are normal. Insight and judgment are appropriate.  Discharge Instructions   Discharge Instructions    Diet - low sodium heart healthy   Complete by:  As directed    Discharge instructions   Complete by:  As directed    You were seen for acute kidney injury and confusion.  Your confusion has improved.  Your kidney injury has improved with IV fluids and stopping medications that can affect the kidney.  Do not take lisinopril or NSAIDs (ibuprofen, aleve, naproxen, etc - avoid all over the counter pain medications except tylenol).  Continue tylenol as needed for the back pain as well as oxycodone as needed.  Lidocaine patches may help as well.  We stopped your lasix, I would continue to hold this until you follow up with your PCP to determine if this is something that will be restarted long term.    If your kidney function does not improve to baseline, you will need to see a nephrologist (kidney doctor) at [facility].  Continue to watch your appetite.  I've stopped your regular insulin and glipizide as your blood sugars have been within reasonable range (I'll  send you with a "sliding scale" for your insulin for now). Continue to check your blood sugars 4 times daily and have your primary doctor adjust this as needed.  I recommend a discussion with palliative care in the future to help establish goals of care.  Return for new, recurrent, or worsening symptoms.  Please ask your PCP to request records from this hospitalization so they know what was done and what the next steps will be.  Sliding Scale as below for insulin CBG < 70: implement hypoglycemia protocol CBG 70 - 120: 0 units CBG 121 - 150: 1 unit CBG 151 - 200: 2 units CBG 201 - 250: 3 units CBG 251 - 300: 5 units CBG 301 - 350: 7  units CBG 351 - 400: 9 units CBG > 400: call MD and obtain STAT lab verification   Increase activity slowly   Complete by:  As directed      Allergies as of 02/14/2018      Reactions   Statins Other (See Comments)   Leg cramping   Metformin And Related    Pt stopped it due to diarrhea   Vytorin [ezetimibe-simvastatin] Other (See Comments)   Leg cramps      Medication List    STOP taking these medications   furosemide 20 MG tablet Commonly known as:  LASIX   glipiZIDE 10 MG tablet Commonly known as:  GLUCOTROL   HYDROcodone-acetaminophen 5-325 MG tablet Commonly known as:  NORCO/VICODIN   ibuprofen 200 MG tablet Commonly known as:  ADVIL,MOTRIN   insulin aspart protamine - aspart (70-30) 100 UNIT/ML FlexPen Commonly known as:  NOVOLOG MIX 70/30 FLEXPEN   lisinopril 5 MG tablet Commonly known as:  PRINIVIL,ZESTRIL     TAKE these medications   acetaminophen 325 MG tablet Commonly known as:  TYLENOL Take 2 tablets (650 mg total) by mouth every 6 (six) hours as needed.   aspirin EC 81 MG tablet Take 81 mg by mouth daily.   carvedilol 12.5 MG tablet Commonly known as:  COREG Take 6.25 mg by mouth 2 (two) times daily.   clopidogrel 75 MG tablet Commonly known as:  PLAVIX TAKE 1 TABLET BY MOUTH ONCE DAILY   feeding supplement (PRO-STAT SUGAR FREE 64) Liqd Take 30 mLs by mouth 3 (three) times daily between meals.   FREESTYLE LITE test strip Generic drug:  glucose blood USE 1 STRIP TO CHECK GLUCOSE TWICE DAILY   hydrOXYzine 25 MG tablet Commonly known as:  ATARAX/VISTARIL Take 25 mg by mouth 3 (three) times daily as needed for itching.   insulin aspart 100 UNIT/ML injection Commonly known as:  novoLOG Inject 0-9 Units into the skin 3 (three) times daily with meals.   levothyroxine 50 MCG tablet Commonly known as:  SYNTHROID, LEVOTHROID TAKE 1 TABLET BY MOUTH ONCE DAILY   lidocaine 5 % Commonly known as:  LIDODERM Place 1 patch onto the skin daily.  Remove & Discard patch within 12 hours or as directed by MD Start taking on:  02/15/2018   magnesium oxide 400 (241.3 Mg) MG tablet Commonly known as:  MAG-OX Take 1 tablet (400 mg total) by mouth daily. Start taking on:  02/15/2018   multivitamin with minerals Tabs tablet Take 1 tablet by mouth daily. Start taking on:  02/15/2018   oxyCODONE 5 MG immediate release tablet Commonly known as:  Oxy IR/ROXICODONE Take 0.5 tablets (2.5 mg total) by mouth every 6 (six) hours as needed for up to 5 days for severe pain.  rosuvastatin 20 MG tablet Commonly known as:  CRESTOR TAKE 1 TABLET BY MOUTH ONCE DAILY What changed:    how much to take  how to take this  when to take this   Vitamin D (Ergocalciferol) 50000 units Caps capsule Commonly known as:  DRISDOL Take 1 capsule by mouth every 7 (seven) days. (MONDAYS)      Allergies  Allergen Reactions  . Statins Other (See Comments)    Leg cramping  . Metformin And Related     Pt stopped it due to diarrhea  . Vytorin [Ezetimibe-Simvastatin] Other (See Comments)    Leg cramps    Contact information for follow-up providers    [name] Grandchild, MD Follow up.   Specialty:  Internal Medicine Why:  Call for follow up  Contact information: [address] [place] [address] [phone]        Julio Sicks, MD Follow up.   Specialty:  Neurosurgery Why:  Call for follow up for compression fractures Contact information: [address] [place] [address] [phone]        [facility].   Specialty:  Nephrology Why:  Call for follow up if kidney function does not return to baseline Contact information: [address] [place] [address] [phone]            Contact information for after-discharge care    Destination    [facility] .   Service:  Skilled Nursing Contact information: [address] [place] [address] [phone]                   The results of significant diagnostics from this hospitalization (including imaging, microbiology, ancillary and laboratory) are listed below for reference.    Significant Diagnostic Studies: Ct Lumbar Spine Wo Contrast  Result Date: 02/03/2018 CLINICAL DATA:  Back pain. EXAM: CT LUMBAR SPINE WITHOUT CONTRAST TECHNIQUE: Multidetector CT imaging of the lumbar spine was performed without intravenous contrast administration. Multiplanar CT image reconstructions were also generated. COMPARISON:  None. FINDINGS: Segmentation: 5 lumbar type vertebrae. Alignment: Grade 1 anterolisthesis of L3 on L4 secondary to facet disease. Vertebrae: Generalized osteopenia. Acute T12 vertebral body compression fracture with approximately 50% anterior height loss. Acute L1 vertebral body compression fracture with less than 10% height loss. Acute L2 vertebral body compression fracture with approximately 10% height loss. No discitis or osteomyelitis. No aggressive osseous lesion. Paraspinal and other soft tissues: No acute paraspinal abnormality. Abdominal aortic atherosclerosis. Other: Severe osteoarthritis of bilateral sacroiliac joints. Disc levels: Disc spaces are maintained. T12-L1: Mild broad-based disc bulge. No foraminal or central canal stenosis. L1-L2: No disc protrusion, foraminal stenosis or central canal stenosis. L2-L3: Mild broad-based disc bulge. No foraminal or central canal stenosis. Mild bilateral facet arthropathy. L3-L4: Broad-based disc bulge. Moderate bilateral facet arthropathy. Mild spinal stenosis. No foraminal stenosis. L4-L5: Broad-based disc bulge. Moderate bilateral facet arthropathy. No foraminal stenosis. L5-S1: Mild broad-based disc bulge. No foraminal or central canal stenosis. IMPRESSION: 1. Acute T12 vertebral body compression fracture with approximately 50% anterior height loss. 2. Acute L1 vertebral body compression  fracture with less than 10% height loss. 3. Acute L2 vertebral body compression fracture with approximately 10% height loss. 4.  Aortic Atherosclerosis (ICD10-I70.0). Electronically Signed   By: Elige Ko   On: 02/03/2018 14:39   US Renal  Result Date: 02/09/2018 CLINICAL DATA:  84 year old female with acute renal insufficiency.  EXAM: RENAL / URINARY TRACT ULTRASOUND COMPLETE COMPARISON:  None. FINDINGS: Right Kidney: Length: 10.5 cm. Mild parenchymal atrophy. No hydronephrosis or shadowing stone. Left Kidney: Length: 7.8 cm. The kidney is poorly visualized. There is apparent mild to moderate left renal atrophy. No hydronephrosis or large shadowing stone. Bladder: Appears normal for degree of bladder distention. IMPRESSION: Mild right and moderate left renal parenchymal atrophy. No hydronephrosis or large shadowing stone. Electronically Signed   By: Elgie Collard M.D.   On: 02/09/2018 22:22   Dg Chest Port 1 View  Result Date: 02/09/2018 CLINICAL DATA:  Sepsis. EXAM: PORTABLE CHEST 1 VIEW COMPARISON:  Radiographs of Jan 10, 2016. FINDINGS: The heart size and mediastinal contours are within normal limits. Both lungs are clear. Hypoinflation of the lungs is noted. Left-sided pacemaker is unchanged in position. No pneumothorax or pleural effusion is noted. The visualized skeletal structures are unremarkable. IMPRESSION: Hypoinflation of the lungs. No acute cardiopulmonary abnormality seen. Electronically Signed   By: Lupita Raider, M.D.   On: 02/09/2018 12:36   Dg Knee Complete 4 Views Right  Result Date: 02/03/2018 CLINICAL DATA:  Fall, right low back and hip pain EXAM: RIGHT KNEE - COMPLETE 4+ VIEW; DG HIP (WITH OR WITHOUT PELVIS) 2-3V RIGHT COMPARISON:  None. FINDINGS: Right hip with pelvis: Bilateral SI joint arthropathy with sclerosis and bony spurring. Aortoiliac atherosclerosis. Bony pelvis intact. Mild hip degenerative change. Right hip appears intact without fracture or malalignment. Right  knee: Previous ORIF for a lateral tibial plateau fracture. Bones are osteopenic. Moderate degenerative change likely post-traumatic of the right knee joint with joint space loss, sclerosis and bony spurring. Normal alignment. No joint effusion. No acute osseous finding. Peripheral atherosclerosis noted. No definite soft tissue abnormality or focal swelling. IMPRESSION: RIGHT HIP AND PELVIS: No acute osseous finding Bilateral SI joint arthropathy (sacroiliitis) Aortoiliac atherosclerosis Osteopenia RIGHT KNEE: Previous right knee lateral tibial plateau ORIF Right knee posttraumatic degenerative osteoarthritis Osteopenia No acute finding by plain radiography Peripheral atherosclerosis Electronically Signed   By: Judie Petit.  Shick M.D.   On: 02/03/2018 13:51   Dg Hip Unilat With Pelvis 2-3 Views Right  Result Date: 02/03/2018 CLINICAL DATA:  Fall, right low back and hip pain EXAM: RIGHT KNEE - COMPLETE 4+ VIEW; DG HIP (WITH OR WITHOUT PELVIS) 2-3V RIGHT COMPARISON:  None. FINDINGS: Right hip with pelvis: Bilateral SI joint arthropathy with sclerosis and bony spurring. Aortoiliac atherosclerosis. Bony pelvis intact. Mild hip degenerative change. Right hip appears intact without fracture or malalignment. Right knee: Previous ORIF for a lateral tibial plateau fracture. Bones are osteopenic. Moderate degenerative change likely post-traumatic of the right knee joint with joint space loss, sclerosis and bony spurring. Normal alignment. No joint effusion. No acute osseous finding. Peripheral atherosclerosis noted. No definite soft tissue abnormality or focal swelling. IMPRESSION: RIGHT HIP AND PELVIS: No acute osseous finding Bilateral SI joint arthropathy (sacroiliitis) Aortoiliac atherosclerosis Osteopenia RIGHT KNEE: Previous right knee lateral tibial plateau ORIF Right knee posttraumatic degenerative osteoarthritis Osteopenia No acute finding by plain radiography Peripheral atherosclerosis Electronically Signed   By: Judie Petit.   Shick M.D.   On: 02/03/2018 13:51    Microbiology: Recent Results (from the past 240 hour(s))  Culture, blood (Routine x 2)     Status: None   Collection Time: 02/09/18 11:47 AM  Result Value Ref Range Status   Specimen Description   Final    LEFT ANTECUBITAL Performed at [facility], [address], [place], [place] [address]    Special Requests  Final    BOTTLES DRAWN AEROBIC AND ANAEROBIC Blood Culture adequate volume Performed at [facility], [address] [place], [place] [address]    Culture   Final    NO GROWTH 5 DAYS Performed at [facility], [address] [place], [place] [address]    Report Status 02/14/2018 FINAL  Final  Culture, blood (Routine x 2)     Status: None   Collection Time: 02/09/18 11:52 AM  Result Value Ref Range Status   Specimen Description   Final    RIGHT ANTECUBITAL Performed at [facility], [address] [place], [place] [address]    Special Requests   Final    BOTTLES DRAWN AEROBIC AND ANAEROBIC Blood Culture results may not be optimal due to an inadequate volume of blood received in culture bottles Performed at [facility], [address], [place], [place] [address]    Culture   Final    NO GROWTH 5 DAYS Performed at [facility], [address], [place], [place] [address]    Report Status 02/14/2018 FINAL  Final  Urine culture     Status: None   Collection Time: 02/09/18  3:59 PM  Result Value Ref Range Status   Specimen Description   Final    URINE, CATHETERIZED Performed at [facility], [address] [place], [place] [address]    Special Requests   Final    NONE Performed at [facility], [address] [place], [place] [address]    Culture   Final    NO GROWTH Performed at [facility], [address] [place], [place] [address]    Report Status 02/10/2018 FINAL  Final      Labs: Basic Metabolic Panel: Recent Labs  Lab 02/10/18 0942 02/11/18 0744 02/12/18 0605 02/13/18 0513 02/14/18 0517  NA 143 144 144 140 140  K 4.0 3.3* 3.1* 3.5 3.5  CL 101 99 102 104 106  CO2 21* 27 28 26 27   GLUCOSE 158* 105* 91 166* 120*  BUN 91* 68* 47* 30* 19  CREATININE 6.37* 4.69* 3.36* 2.47* 2.01*  CALCIUM 7.2* 7.2* 8.3* 8.6* 8.4*  MG  --  2.1 1.9 1.6* 1.5*   Liver Function Tests: Recent Labs  Lab 02/09/18 1147 02/13/18 0513  AST 43* 55*  ALT 27 34  ALKPHOS 84 72  BILITOT 1.0 0.7  PROT 8.6* 6.5  ALBUMIN 3.6 2.5*   No results for input(s): LIPASE, AMYLASE in the last 168 hours. No results for input(s): AMMONIA in the last 168 hours. CBC: Recent Labs  Lab 02/09/18 1147 02/10/18 0942 02/11/18 0744 02/12/18 0605 02/13/18 0513 02/14/18 0517  WBC 14.0* 12.3* 14.1* 11.1* 7.3 6.5  NEUTROABS 11.6  --   --   --   --   --   HGB 13.8 12.3 10.4* 10.9* 10.7* 10.8*  HCT 42.4 37.7 32.6* 34.5* 33.7* 34.4*  MCV 83.1 80.6 79.9 81.8 82.4 82.7  PLT PLATELET CLUMPS NOTED ON SMEAR, UNABLE TO ESTIMATE 168 184 177 165 175   Cardiac Enzymes: No results for input(s): CKTOTAL, CKMB, CKMBINDEX, TROPONINI in the last 168 hours. BNP: BNP (last 3 results) No results for input(s): BNP in the last 8760 hours.  ProBNP (last 3 results) No results for input(s): PROBNP in the last 8760 hours.  CBG: Recent Labs  Lab 02/13/18 1130 02/13/18 1630 02/13/18 2108 02/14/18 0718 02/14/18 1155  GLUCAP 161* 170* 136*  148* 220*       Signed:  Lacretia Nicks MD.  [facility] 02/14/2018, 1:36 PM

## 2018-02-14 NOTE — Progress Notes (Addendum)
IV and tele removed. Attempted to call report to [facility]. Belongings packed up.

## 2018-02-16 ENCOUNTER — Encounter: Payer: Self-pay | Admitting: Internal Medicine

## 2018-02-16 ENCOUNTER — Telehealth: Payer: Self-pay | Admitting: *Deleted

## 2018-02-16 ENCOUNTER — Non-Acute Institutional Stay (SKILLED_NURSING_FACILITY): Payer: Medicare Other | Admitting: Internal Medicine

## 2018-02-16 DIAGNOSIS — S22080A Wedge compression fracture of T11-T12 vertebra, initial encounter for closed fracture: Secondary | ICD-10-CM | POA: Diagnosis not present

## 2018-02-16 DIAGNOSIS — N170 Acute kidney failure with tubular necrosis: Secondary | ICD-10-CM | POA: Diagnosis not present

## 2018-02-16 DIAGNOSIS — N390 Urinary tract infection, site not specified: Secondary | ICD-10-CM | POA: Diagnosis not present

## 2018-02-16 DIAGNOSIS — Z794 Long term (current) use of insulin: Secondary | ICD-10-CM

## 2018-02-16 DIAGNOSIS — S32020D Wedge compression fracture of second lumbar vertebra, subsequent encounter for fracture with routine healing: Secondary | ICD-10-CM

## 2018-02-16 DIAGNOSIS — Z8673 Personal history of transient ischemic attack (TIA), and cerebral infarction without residual deficits: Secondary | ICD-10-CM | POA: Diagnosis not present

## 2018-02-16 DIAGNOSIS — S32010D Wedge compression fracture of first lumbar vertebra, subsequent encounter for fracture with routine healing: Secondary | ICD-10-CM

## 2018-02-16 DIAGNOSIS — N183 Chronic kidney disease, stage 3 unspecified: Secondary | ICD-10-CM

## 2018-02-16 DIAGNOSIS — R8281 Pyuria: Secondary | ICD-10-CM

## 2018-02-16 DIAGNOSIS — G9341 Metabolic encephalopathy: Secondary | ICD-10-CM | POA: Diagnosis not present

## 2018-02-16 DIAGNOSIS — N179 Acute kidney failure, unspecified: Secondary | ICD-10-CM | POA: Diagnosis not present

## 2018-02-16 DIAGNOSIS — E1140 Type 2 diabetes mellitus with diabetic neuropathy, unspecified: Secondary | ICD-10-CM

## 2018-02-16 DIAGNOSIS — I10 Essential (primary) hypertension: Secondary | ICD-10-CM

## 2018-02-16 DIAGNOSIS — E785 Hyperlipidemia, unspecified: Secondary | ICD-10-CM

## 2018-02-16 DIAGNOSIS — E039 Hypothyroidism, unspecified: Secondary | ICD-10-CM

## 2018-02-16 NOTE — Progress Notes (Signed)
:  Location:  [facility] Room Number: 112P Place of Service:  SNF (31)  Anne D. Lyn Hollingshead, MD  Patient Care Team: [name], MD as PCP - General  Extended Emergency Contact Information Primary Emergency Contact: Janeann Forehand Address: [address]          [place], [place] [address] [place] Home Phone: [phone] Mobile Phone: [phone] Relation: Son Secondary Emergency Contact: Silvano Bilis, [place] Home Phone: [phone] Relation: Son     Allergies: Statins; Metformin and related; and Vytorin [ezetimibe-simvastatin]  Chief Complaint  Patient presents with  . New Admit To SNF    Admit to [facility]    HPI: Patient is 84 y.o. female with chronic combined congestive heart failure, coronary artery disease, hypertension, hyperlipidemia, diabetes mellitus 2 who presented to the ED with generalized weakness.  She reportedly had been taking significance amounts of NSAIDs for pain related to back fractures following a fall at home diagnosed in the ED on 6/25.  On arrival to the emergency department she had hyperkalemia of 5.3, acute renal failure with a BUN of 91 and a creatinine of 8.31 with metabolic acidosis.  Patient was admitted to [facility] from 7/1-6 for acute kidney injury and acute encephalopathy.  Renal function improved with IV fluids and encephalopathy related as well.  Patient was treated for possible UTI with Rocephin which was DC'd after there was no growth.  Patient did have problems with pain over compression fractures of T12-L1 and L2 which is being treated with her TLSO brace and oxycodone.  Patient is admitted to skilled nursing facility for OT/PT.  While at skilled nursing facility patient will be followed for hypothyroidism treated with Synthroid, hypertension treated with Coreg and history of stroke and cerebrovascular disease treated with aspirin and  Plavix.  Past Medical History:  Diagnosis Date  . Chronic combined systolic and diastolic CHF, NYHA class 3 (HCC)    a. 03/2013 Echo: EF 25-30%, diff HK, sev antsept HK, mild MR.  . Congestive dilated cardiomyopathy (HCC)    a. 03/2013 Echo: EF 25-30%;  b. 09/2013 s/p SJM 3242 CRT-P.  Marland Kitchen Coronary artery disease    a. 08/2012 Cath: LM nl, LAD 81m, LCX nondom, mild-mod nonobs dzs mid, OM1 ok, OM2 90/90p, RCA 40, EF 25-30%-->Med Rx.  . GERD (gastroesophageal reflux disease)   . High cholesterol   . Hypertension   . Hypothyroidism   . Left bundle branch block   . Osteoarthritis   . Stroke (HCC)   . Type II diabetes mellitus (HCC)     Past Surgical History:  Procedure Laterality Date  . ABDOMINAL HYSTERECTOMY  1980  . BI-VENTRICULAR PACEMAKER INSERTION N/A 10/01/2013   Procedure: BI-VENTRICULAR PACEMAKER INSERTION (CRT-P);  Surgeon: Duke Salvia, MD;  Location: [facility] C[facility] LAB;  Service: Cardiovascular;  Laterality: N/A;  . BI-VENTRICULAR PACEMAKER INSERTION (CRT-P)     a. 09/2013 s/p SJM 3242 CRT-P.  . Bilateral cateract surgery    . BMD  2006  . IR GENERIC HISTORICAL  10/18/2016   IR ANGIO INTRA EXTRACRAN SEL COM CAROTID INNOMINATE BILAT MOD SED 10/18/2016 Julieanne Cotton, MD [facility]-INTERV RAD  . IR GENERIC HISTORICAL  10/18/2016   IR ANGIO VERTEBRAL SEL SUBCLAVIAN INNOMINATE BILAT MOD SED 10/18/2016 Julieanne Cotton, MD [facility]  . ORIF TIBIA PLATEAU Right 12/04/2013   Procedure: OPEN REDUCTION INTERNAL FIXATION (ORIF) TIBIAL PLATEAU;  Surgeon: Verlee Rossetti, MD;  Location: [facility] ORS;  Service: Orthopedics;  Laterality: Right;  . PERIPHERAL VASCULAR CATHETERIZATION N/A 06/17/2016   Procedure: Lower Extremity Angiography;  Surgeon: Runell Gess, MD;  Location: [facility] INVASIVE CV LAB;  Service: Cardiovascular;  Laterality: N/A;  . PERIPHERAL VASCULAR CATHETERIZATION Left 06/17/2016   Procedure: Peripheral Vascular Atherectomy;  Surgeon: Runell Gess, MD;  Location: [facility] INVASIVE CV LAB;  Service:  Cardiovascular;  Laterality: Left;  Popliteal   . RIGHT HEART CATHETERIZATION N/A 09/08/2012   Procedure: RIGHT HEART CATH;  Surgeon: Dolores Patty, MD;  Location: [facility] CATH LAB;  Service: Cardiovascular;  Laterality: N/A;    Allergies as of 02/16/2018      Reactions   Statins Other (See Comments)   Leg cramping   Metformin And Related    Pt stopped it due to diarrhea   Vytorin [ezetimibe-simvastatin] Other (See Comments)   Leg cramps      Medication List        Accurate as of 02/16/18 11:58 AM. Always use your most recent med list.          acetaminophen 325 MG tablet Commonly known as:  TYLENOL Take 2 tablets (650 mg total) by mouth every 6 (six) hours as needed.   aspirin EC 81 MG tablet Take 81 mg by mouth daily.   carvedilol 12.5 MG tablet Commonly known as:  COREG Take 6.25 mg by mouth 2 (two) times daily.   clopidogrel 75 MG tablet Commonly known as:  PLAVIX TAKE 1 TABLET BY MOUTH ONCE DAILY   feeding supplement (PRO-STAT SUGAR FREE 64) Liqd Take 30 mLs by mouth 3 (three) times daily between meals.   FREESTYLE LITE test strip Generic drug:  glucose blood USE 1 STRIP TO CHECK GLUCOSE TWICE DAILY   hydrOXYzine 25 MG tablet Commonly known as:  ATARAX/VISTARIL Take 25 mg by mouth 3 (three) times daily as needed for itching.   insulin aspart 100 UNIT/ML injection Commonly known as:  novoLOG Inject 0-9 Units into the skin 3 (three) times daily with meals.   levothyroxine 50 MCG tablet Commonly known as:  SYNTHROID, LEVOTHROID TAKE 1 TABLET BY MOUTH ONCE DAILY   lidocaine 5 % Commonly known as:  LIDODERM Place 1 patch onto the skin daily. Remove & Discard patch within 12 hours or as directed by MD   magnesium oxide 400 (241.3 Mg) MG tablet Commonly known as:  MAG-OX Take 1 tablet (400 mg total) by mouth daily.   multivitamin with minerals Tabs tablet Take 1 tablet by mouth daily.   oxyCODONE 5 MG immediate release tablet Commonly known as:  Oxy  IR/ROXICODONE Take 0.5 tablets (2.5 mg total) by mouth every 6 (six) hours as needed for up to 5 days for severe pain.   rosuvastatin 20 MG tablet Commonly known as:  CRESTOR TAKE 1 TABLET BY MOUTH ONCE DAILY   Vitamin D (Ergocalciferol) 50000 units Caps capsule Commonly known as:  DRISDOL Take 1 capsule by mouth every 7 (seven) days. (MONDAYS)       No orders of the defined types were placed in this encounter.   Immunization History  Administered Date(s) Administered  . Influenza Split 05/12/2012  . Influenza Whole 06/21/2009, 05/11/2010, 04/18/2011  . Influenza, High Dose Seasonal PF 05/29/2016, 06/03/2017  . Influenza,inj,Quad PF,6+ Mos 05/14/2013, 05/03/2015  . Pneumococcal Conjugate-13 03/10/2014  . Pneumococcal Polysaccharide-23 08/01/2012  . Tdap 11/29/2010    Social History   Tobacco Use  . Smoking status: Former  Smoker  . Smokeless tobacco: Former Engineer, water Use Topics  . Alcohol use: No    Family history is   Family History  Problem Relation Age of Onset  . Diabetes Mother   . Diabetes Other   . Hypertension Other   . Uterine cancer Other   . Heart attack Brother       Review of Systems  DATA OBTAINED: from patient, nurse GENERAL:  no fevers, fatigue, appetite changes SKIN: No itching, or rash EYES: No eye pain, redness, discharge EARS: No earache, tinnitus, change in hearing NOSE: No congestion, drainage or bleeding  MOUTH/THROAT: No mouth or tooth pain, No sore throat RESPIRATORY: No cough, wheezing, SOB CARDIAC: No chest pain, palpitations, lower extremity edema  GI: No abdominal pain, No N/V/D or constipation, No heartburn or reflux  GU: No dysuria, frequency or urgency, or incontinence  MUSCULOSKELETAL: No unrelieved bone/joint pain NEUROLOGIC: No headache, dizziness or focal weakness PSYCHIATRIC: No c/o anxiety or sadness   Vitals:   02/16/18 1148  BP: 136/78  Pulse: 61  Resp: 18  Temp: 98 F (36.7 C)  SpO2: 98%    SpO2  Readings from Last 1 Encounters:  02/16/18 98%   Body mass index is 24.25 kg/m.     Physical Exam  GENERAL APPEARANCE: Alert, conversant,  No acute distress.  SKIN: No diaphoresis rash HEAD: Normocephalic, atraumatic  EYES: Conjunctiva/lids clear. Pupils round, reactive. EOMs intact.  EARS: External exam WNL, canals clear. Hearing grossly normal.  NOSE: No deformity or discharge.  MOUTH/THROAT: Lips w/o lesions  RESPIRATORY: Breathing is even, unlabored. Lung sounds are clear   CARDIOVASCULAR: Heart RRR no murmurs, rubs or gallops. No peripheral edema.   GASTROINTESTINAL: Abdomen is soft, non-tender, not distended w/ normal bowel sounds. GENITOURINARY: Bladder non tender, not distended  MUSCULOSKELETAL: No abnormal joints or musculature NEUROLOGIC:  Cranial nerves 2-12 grossly intact. Moves all extremities  PSYCHIATRIC: Mood and affect appropriate to situation, no behavioral issues  Patient Active Problem List   Diagnosis Date Noted  . AKI (acute kidney injury) (HCC) 02/09/2018  . Encounter for post-traumatic wound check 12/16/2016  . Critical lower limb ischemia 06/17/2016  . Diabetic neuropathy, painful (HCC) 04/24/2016  . Left carotid artery stenosis   . Hypertriglyceridemia, essential 01/10/2016  . 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 Type 2 diabetes mellitus with diabetic neuropathy, with long-term current use of insulin (HCC) 10/06/2015  . Deficiency anemia 03/10/2014  . Osteoporosis, unspecified 03/10/2014  . Left bundle branch block 07/01/2013  . Coronary atherosclerosis of native coronary artery 02/16/2013  . Chronic systolic heart failure (HCC) 08/16/2012  . Congestive dilated cardiomyopathy (HCC) 08/02/2012  . HTN (hypertension) 08/02/2012  . Familial tremor 11/29/2010  . VITAMIN D DEFICIENCY 04/04/2010  . Osteoarthrosis, unspecified whether generalized or localized, unspecified site 04/04/2010  .  Hyperlipidemia with target LDL less than 70 01/19/2007  . Hypothyroidism 01/14/2007      Labs reviewed: Basic Metabolic Panel:    Component Value Date/Time   NA 140 02/14/2018 0517   K 3.5 02/14/2018 0517   CL 106 02/14/2018 0517   CO2 27 02/14/2018 0517   GLUCOSE 120 (H) 02/14/2018 0517   GLUCOSE 112 (H) 08/14/2006 1339   BUN 19 02/14/2018 0517   CREATININE 2.01 (H) 02/14/2018 0517   CREATININE 1.29 (H) 06/07/2016 1202   CALCIUM 8.4 (L) 02/14/2018 0517   PROT 6.5 02/13/2018 0513   ALBUMIN 2.5 (L) 02/13/2018 0513   AST  55 (H) 02/13/2018 0513   ALT 34 02/13/2018 0513   ALKPHOS 72 02/13/2018 0513   BILITOT 0.7 02/13/2018 0513   GFRNONAA 22 (L) 02/14/2018 0517   GFRAA 26 (L) 02/14/2018 0517    Recent Labs    02/12/18 0605 02/13/18 0513 02/14/18 0517  NA 144 140 140  K 3.1* 3.5 3.5  CL 102 104 106  CO2 28 26 27   GLUCOSE 91 166* 120*  BUN 47* 30* 19  CREATININE 3.36* 2.47* 2.01*  CALCIUM 8.3* 8.6* 8.4*  MG 1.9 1.6* 1.5*   Liver Function Tests: Recent Labs    02/09/18 1147 02/13/18 0513  AST 43* 55*  ALT 27 34  ALKPHOS 84 72  BILITOT 1.0 0.7  PROT 8.6* 6.5  ALBUMIN 3.6 2.5*   No results for input(s): LIPASE, AMYLASE in the last 8760 hours. No results for input(s): AMMONIA in the last 8760 hours. CBC: Recent Labs    02/09/18 1147  02/12/18 0605 02/13/18 0513 02/14/18 0517  WBC 14.0*   < > 11.1* 7.3 6.5  NEUTROABS 11.6  --   --   --   --   HGB 13.8   < > 10.9* 10.7* 10.8*  HCT 42.4   < > 34.5* 33.7* 34.4*  MCV 83.1   < > 81.8 82.4 82.7  PLT PLATELET CLUMPS NOTED ON SMEAR, UNABLE TO ESTIMATE   < > 177 165 175   < > = values in this interval not displayed.   Lipid No results for input(s): CHOL, HDL, LDLCALC, TRIG in the last 8760 hours.  Cardiac Enzymes: No results for input(s): CKTOTAL, CKMB, CKMBINDEX, TROPONINI in the last 8760 hours. BNP: No results for input(s): BNP in the last 8760 hours. Lab Results  Component Value Date   MICROALBUR 3.6  (H) 01/10/2016   Lab Results  Component Value Date   HGBA1C 8.8 12/08/2017   Lab Results  Component Value Date   TSH 0.463 02/11/2018   Lab Results  Component Value Date   VITAMINB12 495 02/12/2018   Lab Results  Component Value Date   FOLATE 19.8 10/03/2016   Lab Results  Component Value Date   IRON 45 10/03/2016   FERRITIN 49.6 10/03/2016    Imaging and Procedures obtained prior to SNF admission: US Renal  Result Date: 02/09/2018 CLINICAL DATA:  84 year old female with acute renal insufficiency. EXAM: RENAL / URINARY TRACT ULTRASOUND COMPLETE COMPARISON:  None. FINDINGS: Right Kidney: Length: 10.5 cm. Mild parenchymal atrophy. No hydronephrosis or shadowing stone. Left Kidney: Length: 7.8 cm. The kidney is poorly visualized. There is apparent mild to moderate left renal atrophy. No hydronephrosis or large shadowing stone. Bladder: Appears normal for degree of bladder distention. IMPRESSION: Mild right and moderate left renal parenchymal atrophy. No hydronephrosis or large shadowing stone. Electronically Signed   By: Elgie Collard M.D.   On: 02/09/2018 22:22   Dg Chest Port 1 View  Result Date: 02/09/2018 CLINICAL DATA:  Sepsis. EXAM: PORTABLE CHEST 1 VIEW COMPARISON:  Radiographs of Jan 10, 2016. FINDINGS: The heart size and mediastinal contours are within normal limits. Both lungs are clear. Hypoinflation of the lungs is noted. Left-sided pacemaker is unchanged in position. No pneumothorax or pleural effusion is noted. The visualized skeletal structures are unremarkable. IMPRESSION: Hypoinflation of the lungs. No acute cardiopulmonary abnormality seen. Electronically Signed   By: Lupita Raider, M.D.   On: 02/09/2018 12:36     Not all labs, radiology exams or other studies done during hospitalization come  through on my EPIC note; however they are reviewed by me.    Assessment and Plan  Acute renal failure on chronic kidney disease stage III- baseline 1.3-4 ultrasound  with bilateral kidney atrophy without hydronephrosis; creatinine improved with IV fluids-2.01 on day of discharge; nephrology recommends against restarting ACE and of course avoiding NSAIDs contrast etc. SNF -admitted for OT/PT; we will follow-up BMP to follow creatinine  Acute metabolic encephalopathy- probable underlying dementia, uremia contributing; normal TSH,  Pyuria-treated with Rocephin, urine with no growth, Rocephin stopped  Compression fractures of T12, L1, L2 SNF -continue TLSO brace; continue Tylenol oxycodone, lidocaine patch; follow-up neurosurgery as outpatient  Diabetes mellitus type 2-poorly controlled, hemoglobin A1c 8.8% SNF -continue sliding scale insulin for now and will start long-acting insulin when we have enough information  History of CVA SNF -continue ASA 81 mg daily and Plavix 75 mg daily  Hypertension SNF -controlled; continue Coreg 6.25 mg twice daily  Hypothyroidism SNF -TSH normal; continue 50 mcg daily    Time spent greater than 45 minutes;> 50% of time with patient was spent reviewing records, labs, tests and studies, counseling and developing plan of care  Thurston Hole D. Lyn Hollingshead, MD

## 2018-02-16 NOTE — Telephone Encounter (Signed)
Pt was on TCM report she was admitted 02/09/18 for AKI and acute encephalopathy.  It was assumed her AKI was in the setting of dehydration, NSAIDs, diuretics, ACE.  Nephrology was c/s and recommended IVF.  Her renal function improved with supportive care. Pt D/C 02/14/18 to [facility]. Per summary will need to follow-up w/PCP after release from SNF.Marland KitchenRaechel Chute

## 2018-02-19 LAB — BASIC METABOLIC PANEL
BUN: 14 (ref 4–21)
Creatinine: 1.3 — AB (ref <=1.1)
GLUCOSE: 262
Potassium: 3.8 (ref 3.4–5.3)
SODIUM: 137 (ref 137–147)

## 2018-02-19 LAB — CBC AND DIFFERENTIAL
HEMATOCRIT: 35 — AB (ref 36–46)
Hemoglobin: 11.4 — AB (ref 12.0–16.0)
Platelets: 297 (ref 150–399)
WBC: 5.8

## 2018-02-21 ENCOUNTER — Encounter: Payer: Self-pay | Admitting: Internal Medicine

## 2018-02-21 DIAGNOSIS — N189 Chronic kidney disease, unspecified: Secondary | ICD-10-CM | POA: Insufficient documentation

## 2018-02-21 DIAGNOSIS — N179 Acute kidney failure, unspecified: Secondary | ICD-10-CM | POA: Insufficient documentation

## 2018-02-21 DIAGNOSIS — R8281 Pyuria: Secondary | ICD-10-CM | POA: Insufficient documentation

## 2018-02-21 DIAGNOSIS — Z8673 Personal history of transient ischemic attack (TIA), and cerebral infarction without residual deficits: Secondary | ICD-10-CM | POA: Insufficient documentation

## 2018-02-21 DIAGNOSIS — S32010A Wedge compression fracture of first lumbar vertebra, initial encounter for closed fracture: Secondary | ICD-10-CM | POA: Insufficient documentation

## 2018-02-21 DIAGNOSIS — G9341 Metabolic encephalopathy: Secondary | ICD-10-CM | POA: Insufficient documentation

## 2018-02-21 DIAGNOSIS — S32020A Wedge compression fracture of second lumbar vertebra, initial encounter for closed fracture: Secondary | ICD-10-CM | POA: Insufficient documentation

## 2018-02-21 DIAGNOSIS — S22080A Wedge compression fracture of T11-T12 vertebra, initial encounter for closed fracture: Secondary | ICD-10-CM | POA: Insufficient documentation

## 2018-02-24 ENCOUNTER — Ambulatory Visit: Payer: Medicare Other | Admitting: Internal Medicine

## 2018-02-26 LAB — CUP PACEART REMOTE DEVICE CHECK
Battery Remaining Longevity: 82 mo
Battery Remaining Percentage: 89 %
Battery Voltage: 2.95 V
Brady Statistic AP VP Percent: 38 %
Brady Statistic AP VS Percent: 1 %
Brady Statistic RA Percent Paced: 38 %
Date Time Interrogation Session: 20190701060036
Implantable Lead Implant Date: 20150220
Implantable Lead Implant Date: 20150220
Implantable Lead Implant Date: 20150220
Implantable Lead Location: 753860
Implantable Pulse Generator Implant Date: 20150220
Lead Channel Impedance Value: 400 Ohm
Lead Channel Impedance Value: 450 Ohm
Lead Channel Impedance Value: 940 Ohm
Lead Channel Pacing Threshold Amplitude: 0.5 V
Lead Channel Pacing Threshold Pulse Width: 0.4 ms
Lead Channel Sensing Intrinsic Amplitude: 12 mV
Lead Channel Setting Pacing Amplitude: 2.25 V
Lead Channel Setting Pacing Pulse Width: 1 ms
MDC IDC LEAD LOCATION: 753858
MDC IDC LEAD LOCATION: 753859
MDC IDC MSMT LEADCHNL LV PACING THRESHOLD AMPLITUDE: 1.25 V
MDC IDC MSMT LEADCHNL LV PACING THRESHOLD PULSEWIDTH: 1 ms
MDC IDC MSMT LEADCHNL RA SENSING INTR AMPL: 3.6 mV
MDC IDC MSMT LEADCHNL RV PACING THRESHOLD AMPLITUDE: 1.125 V
MDC IDC MSMT LEADCHNL RV PACING THRESHOLD PULSEWIDTH: 0.4 ms
MDC IDC PG SERIAL: 7578583
MDC IDC SET LEADCHNL RA PACING AMPLITUDE: 1.5 V
MDC IDC SET LEADCHNL RV PACING AMPLITUDE: 2.125
MDC IDC SET LEADCHNL RV PACING PULSEWIDTH: 0.4 ms
MDC IDC SET LEADCHNL RV SENSING SENSITIVITY: 2 mV
MDC IDC STAT BRADY AS VP PERCENT: 62 %
MDC IDC STAT BRADY AS VS PERCENT: 1 %

## 2018-03-02 ENCOUNTER — Non-Acute Institutional Stay (SKILLED_NURSING_FACILITY): Payer: Medicare Other | Admitting: Internal Medicine

## 2018-03-02 ENCOUNTER — Encounter: Payer: Self-pay | Admitting: Internal Medicine

## 2018-03-02 DIAGNOSIS — Z8673 Personal history of transient ischemic attack (TIA), and cerebral infarction without residual deficits: Secondary | ICD-10-CM | POA: Diagnosis not present

## 2018-03-02 DIAGNOSIS — E039 Hypothyroidism, unspecified: Secondary | ICD-10-CM

## 2018-03-02 DIAGNOSIS — N183 Chronic kidney disease, stage 3 (moderate): Secondary | ICD-10-CM

## 2018-03-02 DIAGNOSIS — N390 Urinary tract infection, site not specified: Secondary | ICD-10-CM | POA: Diagnosis not present

## 2018-03-02 DIAGNOSIS — G9341 Metabolic encephalopathy: Secondary | ICD-10-CM | POA: Diagnosis not present

## 2018-03-02 DIAGNOSIS — S32010D Wedge compression fracture of first lumbar vertebra, subsequent encounter for fracture with routine healing: Secondary | ICD-10-CM | POA: Diagnosis not present

## 2018-03-02 DIAGNOSIS — S32020D Wedge compression fracture of second lumbar vertebra, subsequent encounter for fracture with routine healing: Secondary | ICD-10-CM

## 2018-03-02 DIAGNOSIS — N170 Acute kidney failure with tubular necrosis: Secondary | ICD-10-CM

## 2018-03-02 DIAGNOSIS — S22080A Wedge compression fracture of T11-T12 vertebra, initial encounter for closed fracture: Secondary | ICD-10-CM

## 2018-03-02 DIAGNOSIS — Z794 Long term (current) use of insulin: Secondary | ICD-10-CM

## 2018-03-02 DIAGNOSIS — I10 Essential (primary) hypertension: Secondary | ICD-10-CM

## 2018-03-02 DIAGNOSIS — E1140 Type 2 diabetes mellitus with diabetic neuropathy, unspecified: Secondary | ICD-10-CM

## 2018-03-02 DIAGNOSIS — R8281 Pyuria: Secondary | ICD-10-CM

## 2018-03-02 NOTE — Progress Notes (Signed)
Location:  [facility] Room Number: 112P Place of Service:  SNF (31)  Randon Goldsmith. Lyn Hollingshead, MD  Patient Care Team: [name], MD as PCP - General Otho Ket, RN as [facility] Care Management Kemper Durie, RN as [facility] Care Management  Extended Emergency Contact Information Primary Emergency Contact: Janeann Forehand Address: [address]          [place], [place] [address] [place] [facility] [employer] Home Phone: [phone] Mobile Phone: [phone] Relation: Son Secondary Emergency Contact: Silvano Bilis, [place] Home Phone: [phone] Relation: Son  Allergies  Allergen Reactions  . Statins Other (See Comments)    Leg cramping  . Metformin And Related     Pt stopped it due to diarrhea  . Vytorin [Ezetimibe-Simvastatin] Other (See Comments)    Leg cramps    Chief Complaint  Patient presents with  . Discharge Note    Discharge from [facility]    HPI:  84 y.o. female combined congestive heart failure, coronary artery disease, hypertension, hyperlipidemia, diabetes mellitus 2, who presented to the emergency department with generalized weakness.  She reportedly had been taking significant amounts of NSAIDs for pain related to back fractures following a fall at home diagnosed in the ED on 6/25.  On arrival to the emergency department she had hyperkalemia of 5.3, acute renal failure with BUN of 91 and creatinine of 8.31 with metabolic acidosis.  Patient was admitted to [facility] from 7/1-6 for acute kidney injury and acute encephalopathy.  Renal function improved with IV fluids and encephalopathy improved as well.  Patient was treated for possible UTI with Rocephin which was DC'd after there was no growth.  Patient did have pain with problems from compression fractures of T12-L -1 and L-which is being treated with her TLSO brace and oxycodone.  Patient was admitted  to skilled nursing facility for OT/PT and is now ready to be discharged to home.    Past Medical History:  Diagnosis Date  . Chronic combined systolic and diastolic CHF, NYHA class 3 (HCC)    a. 03/2013 Echo: EF 25-30%, diff HK, sev antsept HK, mild MR.  . Congestive dilated cardiomyopathy (HCC)    a. 03/2013 Echo: EF 25-30%;  b. 09/2013 s/p SJM 3242 CRT-P.  Marland Kitchen Coronary artery disease    a. 08/2012 Cath: LM nl, LAD 66m, LCX nondom, mild-mod nonobs dzs mid, OM1 ok, OM2 90/90p, RCA 40, EF 25-30%-->Med Rx.  . GERD (gastroesophageal reflux disease)   . High cholesterol   . Hypertension   . Hypothyroidism   . Left bundle branch block   . Osteoarthritis   . Stroke (HCC)   . Type II diabetes mellitus (HCC)     Past Surgical History:  Procedure Laterality Date  . ABDOMINAL HYSTERECTOMY  1980  . BI-VENTRICULAR PACEMAKER INSERTION N/A 10/01/2013   Procedure: BI-VENTRICULAR PACEMAKER INSERTION (CRT-P);  Surgeon: Duke Salvia, MD;  Location: [facility] CATH LAB;  Service: Cardiovascular;  Laterality: N/A;  . BI-VENTRICULAR PACEMAKER INSERTION (CRT-P)     a. 09/2013 s/p SJM 3242 CRT-P.  . Bilateral cateract surgery    . BMD  2006  . IR GENERIC HISTORICAL  10/18/2016   IR ANGIO INTRA EXTRACRAN SEL COM CAROTID INNOMINATE BILAT MOD SED 10/18/2016 Julieanne Cotton, MD [facility]-INTERV RAD  . IR GENERIC HISTORICAL  10/18/2016   IR ANGIO VERTEBRAL SEL SUBCLAVIAN INNOMINATE BILAT MOD  SED 10/18/2016 Julieanne Cotton, MD [facility]  . ORIF TIBIA PLATEAU Right 12/04/2013   Procedure: OPEN REDUCTION INTERNAL FIXATION (ORIF) TIBIAL PLATEAU;  Surgeon: Verlee Rossetti, MD;  Location: [facility] ORS;  Service: Orthopedics;  Laterality: Right;  . PERIPHERAL VASCULAR CATHETERIZATION N/A 06/17/2016   Procedure: Lower Extremity Angiography;  Surgeon: Runell Gess, MD;  Location: [facility] INVASIVE CV LAB;  Service: Cardiovascular;  Laterality: N/A;  . PERIPHERAL VASCULAR CATHETERIZATION Left 06/17/2016   Procedure: Peripheral Vascular  Atherectomy;  Surgeon: Runell Gess, MD;  Location: [facility] INVASIVE CV LAB;  Service: Cardiovascular;  Laterality: Left;  Popliteal   . RIGHT HEART CATHETERIZATION N/A 09/08/2012   Procedure: RIGHT HEART CATH;  Surgeon: Dolores Patty, MD;  Location: [facility] CATH LAB;  Service: Cardiovascular;  Laterality: N/A;     reports that she has quit smoking. She has quit using smokeless tobacco. She reports that she does not drink alcohol or use drugs. Social History   Socioeconomic History  . Marital status: Divorced    Spouse name: Not on file  . Number of children: 4  . Years of education: Not on file  . Highest education level: Not on file  Occupational History  . Occupation: Retired Designer, television/film set  . Financial resource strain: Not on file  . Food insecurity:    Worry: Not on file    Inability: Not on file  . Transportation needs:    Medical: Not on file    Non-medical: Not on file  Tobacco Use  . Smoking status: Former Games developer  . Smokeless tobacco: Former Engineer, water and Sexual Activity  . Alcohol use: No  . Drug use: No  . Sexual activity: Not Currently  Lifestyle  . Physical activity:    Days per week: Not on file    Minutes per session: Not on file  . Stress: Not on file  Relationships  . Social connections:    Talks on phone: Not on file    Gets together: Not on file    Attends religious service: Not on file    Active member of club or organization: Not on file    Attends meetings of clubs or organizations: Not on file    Relationship status: Not on file  . Intimate partner violence:    Fear of current or ex partner: Not on file    Emotionally abused: Not on file    Physically abused: Not on file    Forced sexual activity: Not on file  Other Topics Concern  . Not on file  Social History Narrative   Widowed.  Lives with her son.  Normally able to ambulate without assistance.  Quit smoking as a teenager.               Pertinent  Health Maintenance Due    Topic Date Due  . INFLUENZA VACCINE  03/12/2018  . URINE MICROALBUMIN  04/02/2018 (Originally 01/09/2017)  . FOOT EXAM  05/03/2018 (Originally 10/03/2017)  . OPHTHALMOLOGY EXAM  05/03/2018 (Originally 10/01/2016)  . DEXA SCAN  Completed  . PNA vac Low Risk Adult  Completed    Medications: Allergies as of 03/02/2018      Reactions   Statins Other (See Comments)   Leg cramping   Metformin And Related    Pt stopped it due to diarrhea   Vytorin [ezetimibe-simvastatin] Other (See Comments)   Leg cramps      Medication List        Accurate as  of 03/02/18 11:59 PM. Always use your most recent med list.          acetaminophen 325 MG tablet Commonly known as:  TYLENOL Take 2 tablets (650 mg total) by mouth every 6 (six) hours as needed.   aspirin EC 81 MG tablet Take 81 mg by mouth daily.   carvedilol 12.5 MG tablet Commonly known as:  COREG Take 6.25 mg by mouth 2 (two) times daily.   clopidogrel 75 MG tablet Commonly known as:  PLAVIX TAKE 1 TABLET BY MOUTH ONCE DAILY   FREESTYLE LITE test strip Generic drug:  glucose blood USE 1 STRIP TO CHECK GLUCOSE TWICE DAILY   insulin aspart 100 UNIT/ML injection Commonly known as:  novoLOG Inject 0-9 Units into the skin 3 (three) times daily with meals.   levothyroxine 50 MCG tablet Commonly known as:  SYNTHROID, LEVOTHROID TAKE 1 TABLET BY MOUTH ONCE DAILY   lidocaine 5 % Commonly known as:  LIDODERM Place 1 patch onto the skin daily. Remove & Discard patch within 12 hours or as directed by MD   magnesium oxide 400 (241.3 Mg) MG tablet Commonly known as:  MAG-OX Take 1 tablet (400 mg total) by mouth daily.   multivitamin with minerals Tabs tablet Take 1 tablet by mouth daily.   OxyCODONE HCl (Abuse Deter) 5 MG Taba Commonly known as:  OXAYDO Take 5 mg by mouth as needed.   rosuvastatin 20 MG tablet Commonly known as:  CRESTOR TAKE 1 TABLET BY MOUTH ONCE DAILY   Vitamin D3 5000 units Caps Take by mouth. Take 1  capsule by mouth every 7 days        Vitals:   03/02/18 1605  BP: 136/80  Pulse: 62  Resp: 17  Temp: 97.7 F (36.5 C)  Weight: 128 lb (58.1 kg)  Height: 5\' 3"  (1.6 m)   Body mass index is 22.67 kg/m.  Physical Exam  GENERAL APPEARANCE: Alert, conversant. No acute distress.  HEENT: Unremarkable. RESPIRATORY: Breathing is even, unlabored. Lung sounds are clear   CARDIOVASCULAR: Heart RRR no murmurs, rubs or gallops. No peripheral edema.  GASTROINTESTINAL: Abdomen is soft, non-tender, not distended w/ normal bowel sounds.  NEUROLOGIC: Cranial nerves 2-12 grossly intact. Moves all extremities   Labs reviewed: Basic Metabolic Panel: Recent Labs    02/12/18 0605 02/13/18 0513 02/14/18 0517  03/10/18 0607 03/11/18 0714 03/12/18 0404  NA 144 140 140   < > 141 141 140  K 3.1* 3.5 3.5   < > 5.3* 3.3* 3.9  CL 102 104 106   < > 109 107 108  CO2 28 26 27    < > 20* 20* 20*  GLUCOSE 91 166* 120*   < > 221* 99 126*  BUN 47* 30* 19   < > 35* 15 12  CREATININE 3.36* 2.47* 2.01*   < > 3.22* 2.05* 1.95*  CALCIUM 8.3* 8.6* 8.4*   < > 9.0 9.1 9.1  MG 1.9 1.6* 1.5*  --   --   --   --    < > = values in this interval not displayed.   Lab Results  Component Value Date   MICROALBUR 3.6 (H) 01/10/2016   Liver Function Tests: Recent Labs    02/09/18 1147 02/13/18 0513 03/10/18 0607  AST 43* 55* 25  ALT 27 34 20  ALKPHOS 84 72 108  BILITOT 1.0 0.7 0.7  PROT 8.6* 6.5 7.3  ALBUMIN 3.6 2.5* 3.2*   No results for input(s): LIPASE,  AMYLASE in the last 8760 hours. No results for input(s): AMMONIA in the last 8760 hours. CBC: Recent Labs    02/09/18 1147  03/09/18 1128  03/10/18 0607 03/11/18 0714 03/12/18 0404  WBC 14.0*   < > 5.6   < > 6.0 7.3 7.9  NEUTROABS 11.6  --  2.3  --  2.6  --   --   HGB 13.8   < > 12.3   < > 12.4 12.6 11.7*  HCT 42.4   < > 38.0   < > 40.5 40.2 36.7  MCV 83.1   < > 81.2   < > 82.8 81.5 80.1  PLT PLATELET CLUMPS NOTED ON SMEAR, UNABLE TO  ESTIMATE   < > 181.0   < > 188 185 176   < > = values in this interval not displayed.   Lipid No results for input(s): CHOL, HDL, LDLCALC, TRIG in the last 8760 hours. Cardiac Enzymes: No results for input(s): CKTOTAL, CKMB, CKMBINDEX, TROPONINI in the last 8760 hours. BNP: No results for input(s): BNP in the last 8760 hours. CBG: Recent Labs    03/12/18 2143 03/13/18 0755 03/13/18 1138  GLUCAP 86 115* 158*    Procedures and Imaging Studies During Stay: Dg Thoracic Spine 2 View  Result Date: 03/09/2018 CLINICAL DATA:  Fall 2 days ago with back pain. EXAM: THORACIC SPINE 2 VIEWS COMPARISON:  Included portion from lumbar spine CT 02/03/2018. Chest radiograph 01/10/2016 FINDINGS: Chronic and unchanged T12 and upper lumbar compression fractures compared with recent lumbar spine CT. T11 compression fracture with approximately 30% loss of height anteriorly was not included in the field of view on prior exam and is age indeterminate, but appears new from 2017 chest radiograph. Mild scoliotic curvature of the upper thoracic spine. IMPRESSION: 1. Age indeterminate T11 compression fracture with approximately 30% loss of height anteriorly. This is new from 2017 chest radiograph. 2. Unchanged compression fractures of T12 and the upper lumbar spine since CT last month. Electronically Signed   By: Rubye Oaks M.D.   On: 03/09/2018 23:53   Dg Lumbar Spine 2-3 Views  Result Date: 03/09/2018 CLINICAL DATA:  Fall 2 days ago with back pain. EXAM: LUMBAR SPINE - 2-3 VIEW COMPARISON:  Lumbar spine CT 02/03/2018 FINDINGS: Unchanged compression fractures of T12, L1, and L2 from prior exam. No significant progression. Compression fracture of T11 with approximately 30% loss of height was not previously included in the field of view and is age indeterminate, not definitely seen on chest radiograph of 01/10/2016. L3, L4, and L5 vertebral body heights are preserved. Similar anterolisthesis of L3 on L4. Posterior  elements appear intact. Sacroiliac joints are congruent. Chronic angulation of the sacrococcygeal junction based on scout from prior lumbar spine CT. Advanced vascular calcifications in the abdomen. IMPRESSION: 1. Chronic and unchanged compression fractures of T12, L1, and L2 compared to lumbar spine CT last month. 2. T11 compression fracture with approximately 30% loss of height anteriorly was not previously included in the field of view and is age indeterminate. Electronically Signed   By: Rubye Oaks M.D.   On: 03/09/2018 23:49   Dg Pelvis 1-2 Views  Result Date: 03/09/2018 CLINICAL DATA:  Fall 2 days ago with back pain. EXAM: PELVIS - 1-2 VIEW COMPARISON:  None. FINDINGS: The cortical margins of the bony pelvis are intact. No fracture. Pubic symphysis and sacroiliac joints are congruent. Both femoral heads are well-seated in the respective acetabula. The bones are under mineralized. IMPRESSION: No visualized pelvic  fracture. Electronically Signed   By: Rubye Oaks M.D.   On: 03/09/2018 23:50   Ct Thoracic Spine Wo Contrast  Result Date: 03/11/2018 CLINICAL DATA:  Severe low back pain radiating to the left lower extremity. Thoracolumbar compression fractures. EXAM: CT THORACIC AND LUMBAR SPINE WITHOUT CONTRAST TECHNIQUE: Multidetector CT imaging of the thoracic and lumbar spine was performed without contrast. Multiplanar CT image reconstructions were also generated. COMPARISON:  Thoracic and lumbar spine radiographs 03/09/2018. Lumbar spine CT 02/03/2018. Chest CTA 07/30/2012. FINDINGS: CT THORACIC SPINE FINDINGS Alignment: Mild upper thoracic levoscoliosis. Straightening of the normal kyphosis in the lower thoracic spine. Trace anterolisthesis of T10 on T11. Vertebrae: T12 superior endplate fracture with close to 50% height loss anteriorly is unchanged from the prior lumbar spine CT. A T11 superior endplate compression fracture with 30% height loss was not included on the prior lumbar spine CT  and is new from 2017 chest radiographs but not clearly acute. There is no T11 vertebral body retropulsion. No suspicious osseous lesion is identified. Paraspinal and other soft tissues: No significant paravertebral soft tissue edema or hematoma at T11. Partially visualized ICD leads. Aortic and coronary artery atherosclerosis. Disc levels: Disc degeneration most advanced at T7-8 and T8-9 where there is severe disc space narrowing, endplate sclerosis, and Schmorl's nodes. Small central disc protrusions at T8-9 larger than T7-8 without evidence of significant spinal stenosis on this non-myelographic examination. Neural foraminal stenosis at these levels is most notable at T8-9, likely moderate bilaterally. Milder disc degeneration elsewhere in the thoracic spine. CT LUMBAR SPINE FINDINGS Segmentation: Standard. Alignment: Unchanged minimal anterolisthesis of L3 on L4. Vertebrae: L1 compression fracture with increasing osseous lucency subjacent to the superior endplate with surrounding sclerosis and increased vertebral body height loss compared to the prior CT, now 20%. L2 compression fracture with unchanged mild height loss. No new lumbar spine fracture. No suspicious osseous lesion. Paraspinal and other soft tissues: Mild paravertebral edema at L1. Abdominal aortic atherosclerosis without aneurysm. Disc levels: Similar appearance of disc and facet degeneration compared 2 last month CT with most notable at findings at L3-4 where there is anterolisthesis with bulging uncovered disc and moderate facet and ligamentum flavum hypertrophy resulting in mild spinal stenosis and mild right and mild-to-moderate left neural foraminal stenosis. IMPRESSION: 1. Mild T11 compression fracture, new from 2017 though not clearly acute and therefore of indeterminate age. 2. L1 compression fracture with mildly progressive height loss since 02/03/2018. 3. Unchanged T12 and L2 compression fractures. 4. Thoracic disc degeneration without  evidence of significant spinal stenosis. Moderate neural foraminal stenosis at T8-9. 5. Lumbar disc and facet degeneration most notable at L3-4 where there is mild spinal and mild-to-moderate neural foraminal stenosis. 6.  Aortic Atherosclerosis (ICD10-I70.0). Electronically Signed   By: Sebastian Ache M.D.   On: 03/11/2018 13:51   Ct Lumbar Spine Wo Contrast  Result Date: 03/11/2018 CLINICAL DATA:  Severe low back pain radiating to the left lower extremity. Thoracolumbar compression fractures. EXAM: CT THORACIC AND LUMBAR SPINE WITHOUT CONTRAST TECHNIQUE: Multidetector CT imaging of the thoracic and lumbar spine was performed without contrast. Multiplanar CT image reconstructions were also generated. COMPARISON:  Thoracic and lumbar spine radiographs 03/09/2018. Lumbar spine CT 02/03/2018. Chest CTA 07/30/2012. FINDINGS: CT THORACIC SPINE FINDINGS Alignment: Mild upper thoracic levoscoliosis. Straightening of the normal kyphosis in the lower thoracic spine. Trace anterolisthesis of T10 on T11. Vertebrae: T12 superior endplate fracture with close to 50% height loss anteriorly is unchanged from the prior lumbar spine CT. A T11 superior  endplate compression fracture with 30% height loss was not included on the prior lumbar spine CT and is new from 2017 chest radiographs but not clearly acute. There is no T11 vertebral body retropulsion. No suspicious osseous lesion is identified. Paraspinal and other soft tissues: No significant paravertebral soft tissue edema or hematoma at T11. Partially visualized ICD leads. Aortic and coronary artery atherosclerosis. Disc levels: Disc degeneration most advanced at T7-8 and T8-9 where there is severe disc space narrowing, endplate sclerosis, and Schmorl's nodes. Small central disc protrusions at T8-9 larger than T7-8 without evidence of significant spinal stenosis on this non-myelographic examination. Neural foraminal stenosis at these levels is most notable at T8-9, likely  moderate bilaterally. Milder disc degeneration elsewhere in the thoracic spine. CT LUMBAR SPINE FINDINGS Segmentation: Standard. Alignment: Unchanged minimal anterolisthesis of L3 on L4. Vertebrae: L1 compression fracture with increasing osseous lucency subjacent to the superior endplate with surrounding sclerosis and increased vertebral body height loss compared to the prior CT, now 20%. L2 compression fracture with unchanged mild height loss. No new lumbar spine fracture. No suspicious osseous lesion. Paraspinal and other soft tissues: Mild paravertebral edema at L1. Abdominal aortic atherosclerosis without aneurysm. Disc levels: Similar appearance of disc and facet degeneration compared 2 last month CT with most notable at findings at L3-4 where there is anterolisthesis with bulging uncovered disc and moderate facet and ligamentum flavum hypertrophy resulting in mild spinal stenosis and mild right and mild-to-moderate left neural foraminal stenosis. IMPRESSION: 1. Mild T11 compression fracture, new from 2017 though not clearly acute and therefore of indeterminate age. 2. L1 compression fracture with mildly progressive height loss since 02/03/2018. 3. Unchanged T12 and L2 compression fractures. 4. Thoracic disc degeneration without evidence of significant spinal stenosis. Moderate neural foraminal stenosis at T8-9. 5. Lumbar disc and facet degeneration most notable at L3-4 where there is mild spinal and mild-to-moderate neural foraminal stenosis. 6.  Aortic Atherosclerosis (ICD10-I70.0). Electronically Signed   By: Sebastian Ache M.D.   On: 03/11/2018 13:51    Assessment/Plan:   Acute renal failure with acute tubular necrosis superimposed on stage 3 chronic kidney disease (HCC)  Acute metabolic encephalopathy  Pyuria  Closed compression fracture of L1 lumbar vertebra with routine healing, subsequent encounter  Closed compression fracture of L2 lumbar vertebra with routine healing, subsequent  encounter  Compression fracture of T12 vertebra (HCC)  History of stroke  Type 2 diabetes mellitus with diabetic neuropathy, with long-term current use of insulin (HCC)  Essential hypertension  Acquired hypothyroidism   Patient is being discharged with the following home health services: OT/PT/nursing/aid  Patient is being discharged with the following durable medical equipment: None  Patient has been advised to f/u with their PCP in 1-2 weeks to bring them up to date on their rehab stay.  Social services at facility was responsible for arranging this appointment.  Pt was provided with a 30 day supply of prescriptions for medications and refills must be obtained from their PCP.  For controlled substances, a more limited supply may be provided adequate until PCP appointment only.  Medications have been reconciled.  Time spent greater than 30 minutes;> 50% of time with patient was spent reviewing records, labs, tests and studies, counseling and developing plan of care  Randon Goldsmith. Lyn Hollingshead, MD

## 2018-03-04 ENCOUNTER — Telehealth: Payer: Self-pay | Admitting: *Deleted

## 2018-03-04 NOTE — Telephone Encounter (Signed)
Receive msg on patient ping. [name] [name] was d/c from [facility] 03/03/18. [facility] (SNF) [place], [place] 03/03/18 - 6:00PM  02/14/18 - 2:16PM Admitted to [facility] (SNF) Insurance: Medicare Part A  Will call pt to follow-up...[name]

## 2018-03-04 NOTE — Telephone Encounter (Signed)
Tried calling pt/son to verify hosp f/u appt that's been scheduled for 03/23/18. Left msg on vm to RTC,,,/lmb

## 2018-03-05 NOTE — Telephone Encounter (Signed)
[name] [name] called back and confirm appt. Completed TCM call below.[name]  Transition Care Management Follow-up Telephone Call   Date discharged? 03/03/18 from SNF ([facility])   How have you been since you were released from the hospital? Per [name] [name] [name] [name] is doing ok   Do you understand why you were in the hospital? YES   Do you understand the discharge instructions? YES   Where were you discharged to? Home   Items Reviewed:  Medications reviewed: YES  Allergies reviewed: YES  Dietary changes reviewed: YES, diabetic and heart healthy  Referrals reviewed: No referral needed   Functional Questionnaire:   Activities of Daily Living (ADLs):   He states she are independent in the following: bathing and hygiene, feeding, continence, grooming and toileting States they require assistance with the following: ambulation and dressing   Any transportation issues/concerns?: NO   Any patient concerns? NO   Confirmed importance and date/time of follow-up visits scheduled YES, appt 03/23/18  Provider Appointment booked with Dr. Yetta Barre  Confirmed with patient if condition begins to worsen call PCP or go to the ER.  Patient was given the office number and encouraged to call back with question or concerns.  : YES

## 2018-03-06 ENCOUNTER — Other Ambulatory Visit: Payer: Self-pay

## 2018-03-06 NOTE — Patient Outreach (Signed)
[facility]) Care Management  03/06/2018  Champagne Faudree 1934/05/14 [id number]   Transition of care  Referral date: 03/05/18 Referral source: discharged from skilled nursing facility on 03/03/18 Insurance: Medicare Attempt #1   Telephone call to patient regarding transition of care  referral. Unable to reach patient. HIPAA compliant voice message left with call back phone number.   PLAN: RNCM will attempt 2nd telephone call to patient within 4 business days. RNCM will send outreach letter.   George Ina RN,BSN,CCM [facility] Telephonic  [phone]

## 2018-03-07 ENCOUNTER — Telehealth: Payer: Self-pay | Admitting: Internal Medicine

## 2018-03-07 DIAGNOSIS — E039 Hypothyroidism, unspecified: Secondary | ICD-10-CM | POA: Diagnosis not present

## 2018-03-07 DIAGNOSIS — Z791 Long term (current) use of non-steroidal anti-inflammatories (NSAID): Secondary | ICD-10-CM | POA: Diagnosis not present

## 2018-03-07 DIAGNOSIS — K219 Gastro-esophageal reflux disease without esophagitis: Secondary | ICD-10-CM | POA: Diagnosis not present

## 2018-03-07 DIAGNOSIS — Z7982 Long term (current) use of aspirin: Secondary | ICD-10-CM | POA: Diagnosis not present

## 2018-03-07 DIAGNOSIS — E1122 Type 2 diabetes mellitus with diabetic chronic kidney disease: Secondary | ICD-10-CM | POA: Diagnosis not present

## 2018-03-07 DIAGNOSIS — E1140 Type 2 diabetes mellitus with diabetic neuropathy, unspecified: Secondary | ICD-10-CM | POA: Diagnosis not present

## 2018-03-07 DIAGNOSIS — M1990 Unspecified osteoarthritis, unspecified site: Secondary | ICD-10-CM | POA: Diagnosis not present

## 2018-03-07 DIAGNOSIS — D631 Anemia in chronic kidney disease: Secondary | ICD-10-CM | POA: Diagnosis not present

## 2018-03-07 DIAGNOSIS — N183 Chronic kidney disease, stage 3 (moderate): Secondary | ICD-10-CM | POA: Diagnosis not present

## 2018-03-07 DIAGNOSIS — R1310 Dysphagia, unspecified: Secondary | ICD-10-CM | POA: Diagnosis not present

## 2018-03-07 DIAGNOSIS — I5032 Chronic diastolic (congestive) heart failure: Secondary | ICD-10-CM | POA: Diagnosis not present

## 2018-03-07 DIAGNOSIS — S32019D Unspecified fracture of first lumbar vertebra, subsequent encounter for fracture with routine healing: Secondary | ICD-10-CM | POA: Diagnosis not present

## 2018-03-07 DIAGNOSIS — I130 Hypertensive heart and chronic kidney disease with heart failure and stage 1 through stage 4 chronic kidney disease, or unspecified chronic kidney disease: Secondary | ICD-10-CM | POA: Diagnosis not present

## 2018-03-07 DIAGNOSIS — Z794 Long term (current) use of insulin: Secondary | ICD-10-CM | POA: Diagnosis not present

## 2018-03-07 DIAGNOSIS — G9341 Metabolic encephalopathy: Secondary | ICD-10-CM | POA: Diagnosis not present

## 2018-03-07 DIAGNOSIS — Z87891 Personal history of nicotine dependence: Secondary | ICD-10-CM | POA: Diagnosis not present

## 2018-03-07 DIAGNOSIS — S32020D Wedge compression fracture of second lumbar vertebra, subsequent encounter for fracture with routine healing: Secondary | ICD-10-CM | POA: Diagnosis not present

## 2018-03-07 DIAGNOSIS — Z9181 History of falling: Secondary | ICD-10-CM | POA: Diagnosis not present

## 2018-03-07 DIAGNOSIS — I2510 Atherosclerotic heart disease of native coronary artery without angina pectoris: Secondary | ICD-10-CM | POA: Diagnosis not present

## 2018-03-07 DIAGNOSIS — Z8673 Personal history of transient ischemic attack (TIA), and cerebral infarction without residual deficits: Secondary | ICD-10-CM | POA: Diagnosis not present

## 2018-03-07 DIAGNOSIS — M810 Age-related osteoporosis without current pathological fracture: Secondary | ICD-10-CM | POA: Diagnosis not present

## 2018-03-07 DIAGNOSIS — E785 Hyperlipidemia, unspecified: Secondary | ICD-10-CM | POA: Diagnosis not present

## 2018-03-07 DIAGNOSIS — S22080D Wedge compression fracture of T11-T12 vertebra, subsequent encounter for fracture with routine healing: Secondary | ICD-10-CM | POA: Diagnosis not present

## 2018-03-07 DIAGNOSIS — I420 Dilated cardiomyopathy: Secondary | ICD-10-CM | POA: Diagnosis not present

## 2018-03-07 DIAGNOSIS — Z950 Presence of cardiac pacemaker: Secondary | ICD-10-CM | POA: Diagnosis not present

## 2018-03-07 NOTE — Telephone Encounter (Signed)
verbal orders given, FYI

## 2018-03-07 NOTE — Telephone Encounter (Signed)
[facility] received a call from [place] at [facility] [phone]) who states that the patient is not alert, BP is 80/50, heart rate is 60 and they have held BP meds. Per Dr Para March (on call), they should call 911 and have the patient taken to the hospital.  [facility] and gave orders from Dr Para March. She states that the patient's son has been giving her lisinopril and oxycodone which were not on her discharge papers from her stay at [facility]. She has stopped these medication and would like to continue to watch the patient. She also stated that the patient was able to get up and walk. I explained that with a blood pressure of this low, she should still be seen. Jeannine and the patient's son acted as if they did not want the patient to go to the ED. They did schedule an appointment on Monday with Ria Clock (due to PCP being unavailable).  Beverely Low would also like orders for PT, OT, a Home Health Aid and Nursing so that she can have someone come out tomorrow to check on the patient.  Please advise.

## 2018-03-09 ENCOUNTER — Other Ambulatory Visit: Payer: Self-pay

## 2018-03-09 ENCOUNTER — Telehealth: Payer: Self-pay

## 2018-03-09 ENCOUNTER — Encounter (HOSPITAL_COMMUNITY): Payer: Self-pay | Admitting: Emergency Medicine

## 2018-03-09 ENCOUNTER — Inpatient Hospital Stay (HOSPITAL_COMMUNITY): Payer: Medicare Other

## 2018-03-09 ENCOUNTER — Other Ambulatory Visit: Payer: Self-pay | Admitting: Internal Medicine

## 2018-03-09 ENCOUNTER — Encounter: Payer: Self-pay | Admitting: Family

## 2018-03-09 ENCOUNTER — Other Ambulatory Visit (INDEPENDENT_AMBULATORY_CARE_PROVIDER_SITE_OTHER): Payer: Medicare Other

## 2018-03-09 ENCOUNTER — Ambulatory Visit (INDEPENDENT_AMBULATORY_CARE_PROVIDER_SITE_OTHER): Payer: Medicare Other | Admitting: Family

## 2018-03-09 ENCOUNTER — Inpatient Hospital Stay (HOSPITAL_COMMUNITY)
Admission: EM | Admit: 2018-03-09 | Discharge: 2018-03-13 | DRG: 683 | Disposition: A | Discharge status: Home Health | Payer: Medicare Other | Attending: Internal Medicine | Admitting: Internal Medicine

## 2018-03-09 VITALS — BP 102/60 | HR 63 | Temp 97.5°F | Ht 63.0 in | Wt 127.1 lb

## 2018-03-09 DIAGNOSIS — M546 Pain in thoracic spine: Secondary | ICD-10-CM | POA: Diagnosis not present

## 2018-03-09 DIAGNOSIS — E785 Hyperlipidemia, unspecified: Secondary | ICD-10-CM | POA: Diagnosis present

## 2018-03-09 DIAGNOSIS — E440 Moderate protein-calorie malnutrition: Secondary | ICD-10-CM | POA: Diagnosis present

## 2018-03-09 DIAGNOSIS — I5042 Chronic combined systolic (congestive) and diastolic (congestive) heart failure: Secondary | ICD-10-CM | POA: Diagnosis present

## 2018-03-09 DIAGNOSIS — R269 Unspecified abnormalities of gait and mobility: Secondary | ICD-10-CM | POA: Diagnosis present

## 2018-03-09 DIAGNOSIS — Z87891 Personal history of nicotine dependence: Secondary | ICD-10-CM | POA: Diagnosis not present

## 2018-03-09 DIAGNOSIS — Z8673 Personal history of transient ischemic attack (TIA), and cerebral infarction without residual deficits: Secondary | ICD-10-CM | POA: Diagnosis not present

## 2018-03-09 DIAGNOSIS — Z794 Long term (current) use of insulin: Secondary | ICD-10-CM

## 2018-03-09 DIAGNOSIS — E039 Hypothyroidism, unspecified: Secondary | ICD-10-CM | POA: Diagnosis present

## 2018-03-09 DIAGNOSIS — Z6821 Body mass index (BMI) 21.0-21.9, adult: Secondary | ICD-10-CM

## 2018-03-09 DIAGNOSIS — S3993XA Unspecified injury of pelvis, initial encounter: Secondary | ICD-10-CM | POA: Diagnosis not present

## 2018-03-09 DIAGNOSIS — I5022 Chronic systolic (congestive) heart failure: Secondary | ICD-10-CM | POA: Diagnosis present

## 2018-03-09 DIAGNOSIS — Z7982 Long term (current) use of aspirin: Secondary | ICD-10-CM

## 2018-03-09 DIAGNOSIS — N184 Chronic kidney disease, stage 4 (severe): Secondary | ICD-10-CM | POA: Diagnosis present

## 2018-03-09 DIAGNOSIS — S22080A Wedge compression fracture of T11-T12 vertebra, initial encounter for closed fracture: Secondary | ICD-10-CM | POA: Diagnosis not present

## 2018-03-09 DIAGNOSIS — Z79890 Hormone replacement therapy (postmenopausal): Secondary | ICD-10-CM | POA: Diagnosis not present

## 2018-03-09 DIAGNOSIS — W19XXXA Unspecified fall, initial encounter: Secondary | ICD-10-CM | POA: Diagnosis present

## 2018-03-09 DIAGNOSIS — Z8249 Family history of ischemic heart disease and other diseases of the circulatory system: Secondary | ICD-10-CM

## 2018-03-09 DIAGNOSIS — Z79899 Other long term (current) drug therapy: Secondary | ICD-10-CM | POA: Diagnosis not present

## 2018-03-09 DIAGNOSIS — M549 Dorsalgia, unspecified: Secondary | ICD-10-CM | POA: Diagnosis not present

## 2018-03-09 DIAGNOSIS — I10 Essential (primary) hypertension: Secondary | ICD-10-CM | POA: Diagnosis not present

## 2018-03-09 DIAGNOSIS — I130 Hypertensive heart and chronic kidney disease with heart failure and stage 1 through stage 4 chronic kidney disease, or unspecified chronic kidney disease: Secondary | ICD-10-CM | POA: Diagnosis present

## 2018-03-09 DIAGNOSIS — R7989 Other specified abnormal findings of blood chemistry: Secondary | ICD-10-CM

## 2018-03-09 DIAGNOSIS — E1151 Type 2 diabetes mellitus with diabetic peripheral angiopathy without gangrene: Secondary | ICD-10-CM | POA: Diagnosis present

## 2018-03-09 DIAGNOSIS — E1122 Type 2 diabetes mellitus with diabetic chronic kidney disease: Secondary | ICD-10-CM | POA: Diagnosis present

## 2018-03-09 DIAGNOSIS — E1136 Type 2 diabetes mellitus with diabetic cataract: Secondary | ICD-10-CM | POA: Diagnosis present

## 2018-03-09 DIAGNOSIS — Z95810 Presence of automatic (implantable) cardiac defibrillator: Secondary | ICD-10-CM

## 2018-03-09 DIAGNOSIS — R64 Cachexia: Secondary | ICD-10-CM | POA: Diagnosis present

## 2018-03-09 DIAGNOSIS — I420 Dilated cardiomyopathy: Secondary | ICD-10-CM | POA: Diagnosis present

## 2018-03-09 DIAGNOSIS — Z90710 Acquired absence of both cervix and uterus: Secondary | ICD-10-CM | POA: Diagnosis not present

## 2018-03-09 DIAGNOSIS — I2510 Atherosclerotic heart disease of native coronary artery without angina pectoris: Secondary | ICD-10-CM | POA: Diagnosis present

## 2018-03-09 DIAGNOSIS — E875 Hyperkalemia: Secondary | ICD-10-CM | POA: Diagnosis present

## 2018-03-09 DIAGNOSIS — E1140 Type 2 diabetes mellitus with diabetic neuropathy, unspecified: Secondary | ICD-10-CM | POA: Diagnosis not present

## 2018-03-09 DIAGNOSIS — R531 Weakness: Secondary | ICD-10-CM | POA: Diagnosis not present

## 2018-03-09 DIAGNOSIS — S3992XA Unspecified injury of lower back, initial encounter: Secondary | ICD-10-CM | POA: Diagnosis not present

## 2018-03-09 DIAGNOSIS — Z888 Allergy status to other drugs, medicaments and biological substances status: Secondary | ICD-10-CM

## 2018-03-09 DIAGNOSIS — R638 Other symptoms and signs concerning food and fluid intake: Secondary | ICD-10-CM | POA: Diagnosis not present

## 2018-03-09 DIAGNOSIS — Z7902 Long term (current) use of antithrombotics/antiplatelets: Secondary | ICD-10-CM

## 2018-03-09 DIAGNOSIS — I447 Left bundle-branch block, unspecified: Secondary | ICD-10-CM | POA: Diagnosis present

## 2018-03-09 DIAGNOSIS — Z833 Family history of diabetes mellitus: Secondary | ICD-10-CM

## 2018-03-09 DIAGNOSIS — N179 Acute kidney failure, unspecified: Principal | ICD-10-CM | POA: Diagnosis present

## 2018-03-09 DIAGNOSIS — M8448XA Pathological fracture, other site, initial encounter for fracture: Secondary | ICD-10-CM | POA: Diagnosis present

## 2018-03-09 DIAGNOSIS — Z781 Physical restraint status: Secondary | ICD-10-CM

## 2018-03-09 DIAGNOSIS — M4855XA Collapsed vertebra, not elsewhere classified, thoracolumbar region, initial encounter for fracture: Secondary | ICD-10-CM | POA: Diagnosis not present

## 2018-03-09 LAB — CBC
HCT: 40.6 % (ref 36.0–46.0)
HEMOGLOBIN: 12.5 g/dL (ref 12.0–15.0)
MCH: 25.8 pg — AB (ref 26.0–34.0)
MCHC: 30.8 g/dL (ref 30.0–36.0)
MCV: 83.7 fL (ref 78.0–100.0)
Platelets: 182 10*3/uL (ref 150–400)
RBC: 4.85 MIL/uL (ref 3.87–5.11)
RDW: 16.3 % — AB (ref 11.5–15.5)
WBC: 6.7 10*3/uL (ref 4.0–10.5)

## 2018-03-09 LAB — BASIC METABOLIC PANEL
Anion gap: 13 (ref 5–15)
BUN: 48 mg/dL — ABNORMAL HIGH (ref 6–23)
BUN: 43 mg/dL — AB (ref 8–23)
CALCIUM: 9.4 mg/dL (ref 8.4–10.5)
CO2: 21 mEq/L (ref 19–32)
CO2: 19 mmol/L — ABNORMAL LOW (ref 22–32)
Calcium: 9 mg/dL (ref 8.9–10.3)
Chloride: 102 mEq/L (ref 96–112)
Chloride: 103 mmol/L (ref 98–111)
Creatinine, Ser: 4.84 mg/dL (ref 0.40–1.20)
Creatinine, Ser: 4.58 mg/dL — ABNORMAL HIGH (ref 0.44–1.00)
GFR calc non Af Amer: 8 mL/min — ABNORMAL LOW (ref >60)
GFR, EST AFRICAN AMERICAN: 9 mL/min — AB (ref >60)
Glucose, Bld: 171 mg/dL — ABNORMAL HIGH (ref 70–99)
Glucose, Bld: 216 mg/dL — ABNORMAL HIGH (ref 70–99)
Potassium: 4.6 mEq/L (ref 3.5–5.1)
Potassium: 4.6 mmol/L (ref 3.5–5.1)
SODIUM: 139 meq/L (ref 135–145)
SODIUM: 135 mmol/L (ref 135–145)

## 2018-03-09 LAB — URINALYSIS, ROUTINE W REFLEX MICROSCOPIC
Bilirubin Urine: NEGATIVE
GLUCOSE, UA: NEGATIVE mg/dL
Ketones, ur: NEGATIVE mg/dL
Leukocytes, UA: NEGATIVE
Nitrite: NEGATIVE
PROTEIN: 30 mg/dL — AB
SPECIFIC GRAVITY, URINE: 1.012 (ref 1.005–1.030)
pH: 5 (ref 5.0–8.0)

## 2018-03-09 LAB — CBC WITH DIFFERENTIAL/PLATELET
Basophils Absolute: 0 10*3/uL (ref 0.0–0.1)
Basophils Relative: 0.8 % (ref 0.0–3.0)
EOS PCT: 4.2 % (ref 0.0–5.0)
Eosinophils Absolute: 0.2 10*3/uL (ref 0.0–0.7)
HCT: 38 % (ref 36.0–46.0)
Hemoglobin: 12.3 g/dL (ref 12.0–15.0)
LYMPHS ABS: 2.5 10*3/uL (ref 0.7–4.0)
Lymphocytes Relative: 44.2 % (ref 12.0–46.0)
MCHC: 32.5 g/dL (ref 30.0–36.0)
MCV: 81.2 fl (ref 78.0–100.0)
MONO ABS: 0.5 10*3/uL (ref 0.1–1.0)
Monocytes Relative: 9.2 % (ref 3.0–12.0)
NEUTROS PCT: 41.6 % — AB (ref 43.0–77.0)
Neutro Abs: 2.3 10*3/uL (ref 1.4–7.7)
PLATELETS: 181 10*3/uL (ref 150.0–400.0)
RBC: 4.67 Mil/uL (ref 3.87–5.11)
RDW: 16.6 % — AB (ref 11.5–15.5)
WBC: 5.6 10*3/uL (ref 4.0–10.5)

## 2018-03-09 LAB — GLUCOSE, CAPILLARY: Glucose-Capillary: 152 mg/dL — ABNORMAL HIGH (ref 70–99)

## 2018-03-09 MED ORDER — LIDOCAINE 5 % EX PTCH
1.0000 | MEDICATED_PATCH | CUTANEOUS | Status: DC
  Administered 2018-03-10 – 2018-03-13 (×4): 1 via TRANSDERMAL
  Filled 2018-03-09 (×4): qty 1

## 2018-03-09 MED ORDER — ONDANSETRON HCL 4 MG/2ML IJ SOLN: 4.0000 mg | Freq: Four times a day (QID) | INTRAMUSCULAR | Status: DC | PRN

## 2018-03-09 MED ORDER — LEVOTHYROXINE SODIUM 50 MCG PO TABS
50.0000 ug | ORAL_TABLET | Freq: Every day | ORAL | Status: DC
  Administered 2018-03-10 – 2018-03-13 (×4): 50 ug via ORAL
  Filled 2018-03-09 (×4): qty 1

## 2018-03-09 MED ORDER — FENTANYL CITRATE (PF) 100 MCG/2ML IJ SOLN
INTRAMUSCULAR | Status: AC
  Filled 2018-03-09: qty 2

## 2018-03-09 MED ORDER — HYDRALAZINE HCL 20 MG/ML IJ SOLN
5.0000 mg | INTRAMUSCULAR | Status: DC | PRN
  Administered 2018-03-10 (×2): 5 mg via INTRAVENOUS
  Filled 2018-03-09 (×2): qty 1

## 2018-03-09 MED ORDER — ONDANSETRON HCL 4 MG PO TABS: 4.0000 mg | ORAL_TABLET | Freq: Four times a day (QID) | ORAL | Status: DC | PRN

## 2018-03-09 MED ORDER — INSULIN ASPART 100 UNIT/ML ~~LOC~~ SOLN
0.0000 [IU] | Freq: Three times a day (TID) | SUBCUTANEOUS | Status: DC
  Administered 2018-03-10: 3 [IU] via SUBCUTANEOUS
  Administered 2018-03-10 – 2018-03-13 (×5): 2 [IU] via SUBCUTANEOUS

## 2018-03-09 MED ORDER — ASPIRIN EC 81 MG PO TBEC
81.0000 mg | DELAYED_RELEASE_TABLET | Freq: Every day | ORAL | Status: DC
  Administered 2018-03-10 – 2018-03-12 (×3): 81 mg via ORAL
  Filled 2018-03-09 (×3): qty 1

## 2018-03-09 MED ORDER — SODIUM CHLORIDE 0.9 % IV SOLN
INTRAVENOUS | Status: AC
  Administered 2018-03-10 (×2): via INTRAVENOUS

## 2018-03-09 MED ORDER — TRAMADOL HCL 50 MG PO TABS
50.0000 mg | ORAL_TABLET | Freq: Once | ORAL | Status: AC
Start: 2018-03-09 — End: 2018-03-10
  Administered 2018-03-10: 50 mg via ORAL
  Filled 2018-03-09: qty 1

## 2018-03-09 MED ORDER — ACETAMINOPHEN 650 MG RE SUPP: 650.0000 mg | Freq: Four times a day (QID) | RECTAL | Status: DC | PRN

## 2018-03-09 MED ORDER — FENTANYL CITRATE (PF) 100 MCG/2ML IJ SOLN: 12.5000 ug | Freq: Once | INTRAMUSCULAR | Status: DC

## 2018-03-09 MED ORDER — CLOPIDOGREL BISULFATE 75 MG PO TABS
75.0000 mg | ORAL_TABLET | Freq: Every day | ORAL | Status: DC
  Administered 2018-03-10 – 2018-03-13 (×4): 75 mg via ORAL
  Filled 2018-03-09 (×4): qty 1

## 2018-03-09 MED ORDER — SODIUM CHLORIDE 0.9 % IV BOLUS
1000.0000 mL | Freq: Once | INTRAVENOUS | Status: AC
  Administered 2018-03-09: 1000 mL via INTRAVENOUS

## 2018-03-09 MED ORDER — ROSUVASTATIN CALCIUM 20 MG PO TABS
20.0000 mg | ORAL_TABLET | Freq: Every day | ORAL | Status: DC
  Administered 2018-03-10 – 2018-03-13 (×4): 20 mg via ORAL
  Filled 2018-03-09 (×4): qty 1

## 2018-03-09 MED ORDER — ACETAMINOPHEN 325 MG PO TABS
650.0000 mg | ORAL_TABLET | Freq: Four times a day (QID) | ORAL | Status: DC | PRN
  Administered 2018-03-09: 650 mg via ORAL
  Filled 2018-03-09: qty 2

## 2018-03-09 MED ORDER — HEPARIN SODIUM (PORCINE) 5000 UNIT/ML IJ SOLN
5000.0000 [IU] | Freq: Three times a day (TID) | INTRAMUSCULAR | Status: DC
  Administered 2018-03-09 – 2018-03-13 (×10): 5000 [IU] via SUBCUTANEOUS
  Filled 2018-03-09 (×8): qty 1

## 2018-03-09 NOTE — Progress Notes (Signed)
[name] [name] is a 84 y.o. female with the following history as recorded in EpicCare:  Patient Active Problem List   Diagnosis Date Noted  . Acute renal failure superimposed on chronic kidney disease (HCC) 02/21/2018  . Acute metabolic encephalopathy 02/21/2018  . Pyuria 02/21/2018  . Compression fracture of L1 lumbar vertebra (HCC) 02/21/2018  . Compression fracture of L2 lumbar vertebra (HCC) 02/21/2018  . Compression fracture of T12 vertebra (HCC) 02/21/2018  . History of stroke 02/21/2018  . AKI (acute kidney injury) (HCC) 02/09/2018  . Encounter for post-traumatic wound check 12/16/2016  . Critical lower limb ischemia 06/17/2016  . Diabetic neuropathy, painful (HCC) 04/24/2016  . Left carotid artery stenosis   . Hypertriglyceridemia, essential 01/10/2016  . 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 Type 2 diabetes mellitus with diabetic neuropathy, with long-term current use of insulin (HCC) 10/06/2015  . Deficiency anemia 03/10/2014  . Osteoporosis, unspecified 03/10/2014  . Left bundle branch block 07/01/2013  . Coronary atherosclerosis of native coronary artery 02/16/2013  . Chronic systolic heart failure (HCC) 08/16/2012  . Congestive dilated cardiomyopathy (HCC) 08/02/2012  . HTN (hypertension) 08/02/2012  . Familial tremor 11/29/2010  . VITAMIN D DEFICIENCY 04/04/2010  . Osteoarthrosis, unspecified whether generalized or localized, unspecified site 04/04/2010  . Hyperlipidemia with target LDL less than 70 01/19/2007  . Hypothyroidism 01/14/2007    Current Outpatient Medications  Medication Sig Dispense Refill  . acetaminophen (TYLENOL) 325 MG tablet Take 2 tablets (650 mg total) by mouth every 6 (six) hours as needed. 60 tablet 0  . aspirin EC 81 MG tablet Take 81 mg by mouth daily.     . carvedilol (COREG) 12.5 MG tablet Take 6.25 mg by mouth 2 (two) times daily.    . Cholecalciferol (VITAMIN D3) 5000 units CAPS Take  by mouth. Take 1 capsule by mouth every 7 days    . clopidogrel (PLAVIX) 75 MG tablet TAKE 1 TABLET BY MOUTH ONCE DAILY 90 tablet 0  . FREESTYLE LITE test strip USE 1 STRIP TO CHECK GLUCOSE TWICE DAILY 100 each 7  . insulin aspart (NOVOLOG) 100 UNIT/ML injection Inject 0-9 Units into the skin 3 (three) times daily with meals. 10 mL 0  . levothyroxine (SYNTHROID, LEVOTHROID) 50 MCG tablet TAKE 1 TABLET BY MOUTH ONCE DAILY 90 tablet 1  . lidocaine (LIDODERM) 5 % Place 1 patch onto the skin daily. Remove & Discard patch within 12 hours or as directed by MD 30 patch 0  . magnesium oxide (MAG-OX) 400 (241.3 Mg) MG tablet Take 1 tablet (400 mg total) by mouth daily. 30 tablet 0  . Multiple Vitamin (MULTIVITAMIN WITH MINERALS) TABS tablet Take 1 tablet by mouth daily. 30 tablet 0  . OxyCODONE HCl, Abuse Deter, (OXAYDO) 5 MG TABA Take by mouth. take 1/2 tab (2.5 mg ) po every 6 hours as scheduled    . rosuvastatin (CRESTOR) 20 MG tablet TAKE 1 TABLET BY MOUTH ONCE DAILY 90 tablet 0   No current facility-administered medications for this visit.     Allergies: Statins; Metformin and related; and Vytorin [ezetimibe-simvastatin]  Past Medical History:  Diagnosis Date  . Chronic combined systolic and diastolic CHF, NYHA class 3 (HCC)    a. 03/2013 Echo: EF 25-30%, diff HK, sev antsept HK, mild MR.  . Congestive dilated cardiomyopathy (HCC)    a. 03/2013 Echo: EF 25-30%;  b. 09/2013 s/p SJM 3242 CRT-P.  Marland Kitchen Coronary artery disease    a.  08/2012 Cath: LM nl, LAD 50m, LCX nondom, mild-mod nonobs dzs mid, OM1 ok, OM2 90/90p, RCA 40, EF 25-30%-->Med Rx.  . GERD (gastroesophageal reflux disease)   . High cholesterol   . Hypertension   . Hypothyroidism   . Left bundle branch block   . Osteoarthritis   . Stroke (HCC)   . Type II diabetes mellitus (HCC)     Past Surgical History:  Procedure Laterality Date  . ABDOMINAL HYSTERECTOMY  1980  . BI-VENTRICULAR PACEMAKER INSERTION N/A 10/01/2013   Procedure:  BI-VENTRICULAR PACEMAKER INSERTION (CRT-P);  Surgeon: Duke Salvia, MD;  Location: [facility] CATH LAB;  Service: Cardiovascular;  Laterality: N/A;  . BI-VENTRICULAR PACEMAKER INSERTION (CRT-P)     a. 09/2013 s/p SJM 3242 CRT-P.  . Bilateral cateract surgery    . BMD  2006  . IR GENERIC HISTORICAL  10/18/2016   IR ANGIO INTRA EXTRACRAN SEL COM CAROTID INNOMINATE BILAT MOD SED 10/18/2016 Julieanne Cotton, MD [facility]-INTERV RAD  . IR GENERIC HISTORICAL  10/18/2016   IR ANGIO VERTEBRAL SEL SUBCLAVIAN INNOMINATE BILAT MOD SED 10/18/2016 Julieanne Cotton, MD [facility]  . ORIF TIBIA PLATEAU Right 12/04/2013   Procedure: OPEN REDUCTION INTERNAL FIXATION (ORIF) TIBIAL PLATEAU;  Surgeon: Verlee Rossetti, MD;  Location: [facility] ORS;  Service: Orthopedics;  Laterality: Right;  . PERIPHERAL VASCULAR CATHETERIZATION N/A 06/17/2016   Procedure: Lower Extremity Angiography;  Surgeon: Runell Gess, MD;  Location: [facility] INVASIVE CV LAB;  Service: Cardiovascular;  Laterality: N/A;  . PERIPHERAL VASCULAR CATHETERIZATION Left 06/17/2016   Procedure: Peripheral Vascular Atherectomy;  Surgeon: Runell Gess, MD;  Location: [facility] INVASIVE CV LAB;  Service: Cardiovascular;  Laterality: Left;  Popliteal   . RIGHT HEART CATHETERIZATION N/A 09/08/2012   Procedure: RIGHT HEART CATH;  Surgeon: Dolores Patty, MD;  Location: [facility] CATH LAB;  Service: Cardiovascular;  Laterality: N/A;    Family History  Problem Relation Age of Onset  . Diabetes Mother   . Diabetes Other   . Hypertension Other   . Uterine cancer Other   . Heart attack Brother     Social History   Tobacco Use  . Smoking status: Former Games developer  . Smokeless tobacco: Former Engineer, water Use Topics  . Alcohol use: No    Subjective:  Patient is brought to the office by her son; was apparently seen by home health on Saturday with concerns for extremely low blood pressure/ dehydration; she had been given Lisinopril accidentally even though it was recommended that it be  stopped upon discharge from nursing home. Per son, they have stopped both Lisinopril and Oxycodone and trying to make the patient drink more water/ drink Gatorade.   Objective:  Vitals:   03/09/18 1052  BP: 102/60  Pulse: 63  Temp: (!) 97.5 F (36.4 C)  TempSrc: Oral  SpO2: 92%  Weight: 127 lb 1.3 oz (57.6 kg)  Height: 5\' 3"  (1.6 m)    General: Well developed, well nourished, in no acute distress  Skin : Warm and dry.  Head: Normocephalic and atraumatic  Lungs: Respirations unlabored; clear to auscultation bilaterally without wheeze, rales, rhonchi  CVS exam: normal rate and regular rhythm.  Neurologic: Does not speak during visit; tremor noted; uses walker for mobility Assessment:  1. Elevated serum creatinine     Plan:  Due to recent hospitalization for acute renal failure, will update CBC and CMP today; re-iterated that she is not supposed to take Lisinopril; follow-up as needed based on labs.   No  follow-ups on file.  Orders Placed This Encounter  Procedures  . Basic Metabolic Panel (BMET)    Standing Status:   Future    Number of Occurrences:   1    Standing Expiration Date:   03/09/2019  . CBC w/Diff    Standing Status:   Future    Number of Occurrences:   1    Standing Expiration Date:   03/09/2019    Requested Prescriptions    No prescriptions requested or ordered in this encounter

## 2018-03-09 NOTE — H&P (Signed)
History and Physical    [name] [name] MBW:[id number] DOB: 1934-01-24 DOA: 03/09/2018  PCP: [name] Grandchild, MD  Patient coming from: Home.  Chief Complaint: Abnormal labs.  HPI: [name] [name] is a 84 y.o. female with history of diabetes mellitus type 2, hypertension, hypothyroidism, chronic combined CHF who was recently admitted and discharged on February 14, 2018 after patient was admitted for acute renal failure at that time patient's creatinine was 8 which improved to 2 at the time of discharge and further improved to 1.3 was eventually discharged to rehab and patient has just discharged from rehab recently was found to have fatigue and weakness had not followed up with her primary care and routine labs showed his creatinine has further worsened to 4.8.  Patient was transferred to the ER for further work-up of acute renal failure.  It is unclear if patient has restarted taking her ACE inhibitor.  Patient still complains of low back pain mostly in the left flank and states he had a fall and this is contributing to the pain.  Denies hitting head or losing consciousness denies any nausea vomiting or diarrhea.  Patient is acute renal failure was attributed during last admission to NSAIDs dehydration and ACE inhibitor.  During recent admission patient also was found to have T12 and L1-L2 compression fracture for which TLSO brace was planned and follow-up with neurosurgery as outpatient.  ED Course: In the ER patient's labs confirm the creatinine is around 4 and was started on fluids.  On my exam patient is significantly in discomfort from low back pain mostly in the left lower back.  No focal deficit.  Denies any head or losing consciousness.  EKG shows paced rhythm.  UA is largely unremarkable.  Review of Systems: As per HPI, rest all negative.   Past Medical History:  Diagnosis Date  . Chronic combined systolic and diastolic CHF, NYHA class 3 (HCC)    a. 03/2013 Echo: EF 25-30%, diff HK, sev  antsept HK, mild MR.  . Congestive dilated cardiomyopathy (HCC)    a. 03/2013 Echo: EF 25-30%;  b. 09/2013 s/p SJM 3242 CRT-P.  Marland Kitchen Coronary artery disease    a. 08/2012 Cath: LM nl, LAD 65m, LCX nondom, mild-mod nonobs dzs mid, OM1 ok, OM2 90/90p, RCA 40, EF 25-30%-->Med Rx.  . GERD (gastroesophageal reflux disease)   . High cholesterol   . Hypertension   . Hypothyroidism   . Left bundle branch block   . Osteoarthritis   . Stroke (HCC)   . Type II diabetes mellitus (HCC)     Past Surgical History:  Procedure Laterality Date  . ABDOMINAL HYSTERECTOMY  1980  . BI-VENTRICULAR PACEMAKER INSERTION N/A 10/01/2013   Procedure: BI-VENTRICULAR PACEMAKER INSERTION (CRT-P);  Surgeon: Duke Salvia, MD;  Location: [facility] CATH LAB;  Service: Cardiovascular;  Laterality: N/A;  . BI-VENTRICULAR PACEMAKER INSERTION (CRT-P)     a. 09/2013 s/p SJM 3242 CRT-P.  . Bilateral cateract surgery    . BMD  2006  . IR GENERIC HISTORICAL  10/18/2016   IR ANGIO INTRA EXTRACRAN SEL COM CAROTID INNOMINATE BILAT MOD SED 10/18/2016 [name] Cotton, MD [facility]  . IR GENERIC HISTORICAL  10/18/2016   IR ANGIO VERTEBRAL SEL SUBCLAVIAN INNOMINATE BILAT MOD SED 10/18/2016 [name] Cotton, MD [facility]  . ORIF TIBIA PLATEAU Right 12/04/2013   Procedure: OPEN REDUCTION INTERNAL FIXATION (ORIF) TIBIAL PLATEAU;  Surgeon: [name] Rossetti, MD;  Location: [facility] ORS;  Service: Orthopedics;  Laterality: Right;  . PERIPHERAL VASCULAR  CATHETERIZATION N/A 06/17/2016   Procedure: Lower Extremity Angiography;  Surgeon: [name] Gess, MD;  Location: [facility] INVASIVE CV LAB;  Service: Cardiovascular;  Laterality: N/A;  . PERIPHERAL VASCULAR CATHETERIZATION Left 06/17/2016   Procedure: Peripheral Vascular Atherectomy;  Surgeon: [name] Gess, MD;  Location: [facility] INVASIVE CV LAB;  Service: Cardiovascular;  Laterality: Left;  Popliteal   . RIGHT HEART CATHETERIZATION N/A 09/08/2012   Procedure: RIGHT HEART CATH;  Surgeon: [name] Patty, MD;   Location: [facility] CATH LAB;  Service: Cardiovascular;  Laterality: N/A;     reports that she has quit smoking. She has quit using smokeless tobacco. She reports that she does not drink alcohol or use drugs.  Allergies  Allergen Reactions  . Statins Other (See Comments)    Leg cramping  . Metformin And Related     Pt stopped it due to diarrhea  . Vytorin [Ezetimibe-Simvastatin] Other (See Comments)    Leg cramps    Family History  Problem Relation Age of Onset  . Diabetes Mother   . Diabetes Other   . Hypertension Other   . Uterine cancer Other   . Heart attack Brother     Prior to Admission medications   Medication Sig Start Date End Date Taking? Authorizing Provider  acetaminophen (TYLENOL) 325 MG tablet Take 2 tablets (650 mg total) by mouth every 6 (six) hours as needed. 02/14/18 03/16/18  Zigmund Daniel., MD  aspirin EC 81 MG tablet Take 81 mg by mouth daily.     [provider]  carvedilol (COREG) 12.5 MG tablet Take 6.25 mg by mouth 2 (two) times daily.    [provider]  Cholecalciferol (VITAMIN D3) 5000 units CAPS Take by mouth. Take 1 capsule by mouth every 7 days    [provider]  clopidogrel (PLAVIX) 75 MG tablet TAKE 1 TABLET BY MOUTH ONCE DAILY 01/12/18   [name] Grandchild, MD  FREESTYLE LITE test strip USE 1 STRIP TO CHECK GLUCOSE TWICE DAILY 02/09/18   [name] Grandchild, MD  insulin aspart (NOVOLOG) 100 UNIT/ML injection Inject 0-9 Units into the skin 3 (three) times daily with meals. 02/14/18   Zigmund Daniel., MD  levothyroxine (SYNTHROID, LEVOTHROID) 50 MCG tablet TAKE 1 TABLET BY MOUTH ONCE DAILY 09/01/17   [name] Pavlov, MD  lidocaine (LIDODERM) 5 % Place 1 patch onto the skin daily. Remove & Discard patch within 12 hours or as directed by MD 02/15/18   Zigmund Daniel., MD  magnesium oxide (MAG-OX) 400 (241.3 Mg) MG tablet Take 1 tablet (400 mg total) by mouth daily. 02/15/18 03/17/18  Zigmund Daniel., MD  Multiple Vitamin  (MULTIVITAMIN WITH MINERALS) TABS tablet Take 1 tablet by mouth daily. 02/15/18 03/17/18  Zigmund Daniel., MD  OxyCODONE HCl, Abuse Deter, (OXAYDO) 5 MG TABA Take by mouth. take 1/2 tab (2.5 mg ) po every 6 hours as scheduled    [provider]  rosuvastatin (CRESTOR) 20 MG tablet TAKE 1 TABLET BY MOUTH ONCE DAILY 01/20/18   [name] Grandchild, MD    Physical Exam: Vitals:   03/09/18 1838 03/09/18 2015 03/09/18 2045 03/09/18 2115  BP: 117/69 118/75 (!) 159/89 (!) 148/99  Pulse: 67 61 60 69  Resp: 18 12 17 14   Temp:      TempSrc:      SpO2: 100% 100% 98% 100%  Weight:      Height:          Constitutional:  Moderately built and nourished. Vitals:   03/09/18 1838 03/09/18 2015 03/09/18 2045 03/09/18 2115  BP: 117/69 118/75 (!) 159/89 (!) 148/99  Pulse: 67 61 60 69  Resp: 18 12 17 14   Temp:      TempSrc:      SpO2: 100% 100% 98% 100%  Weight:      Height:       Eyes: Anicteric no pallor. ENMT: No discharge from the ears eyes nose or mouth. Neck: No mass felt.  No neck rigidity but no JVD appreciated. Respiratory: No rhonchi or crepitations. Cardiovascular: S1-S2 heard no murmurs appreciated. Abdomen: Soft nontender bowel sounds present. Musculoskeletal: No edema.  No joint effusion. Skin: No rash.  Skin appears warm. Neurologic: Alert awake oriented to time place and person.  Moves all extremities. Psychiatric: Appears normal per normal affect.   Labs on Admission: I have personally reviewed following labs and imaging studies  CBC: Recent Labs  Lab 03/09/18 1128 03/09/18 1722  WBC 5.6 6.7  NEUTROABS 2.3  --   HGB 12.3 12.5  HCT 38.0 40.6  MCV 81.2 83.7  PLT 181.0 182   Basic Metabolic Panel: Recent Labs  Lab 03/09/18 1128 03/09/18 1722  NA 139 135  K 4.6 4.6  CL 102 103  CO2 21 19*  GLUCOSE 171* 216*  BUN 48* 43*  CREATININE 4.84 Repeated and verified X2.* 4.58*  CALCIUM 9.4 9.0   GFR: Estimated Creatinine Clearance: 8 mL/min (A) (by C-G  formula based on SCr of 4.58 mg/dL (H)). Liver Function Tests: No results for input(s): AST, ALT, ALKPHOS, BILITOT, PROT, ALBUMIN in the last 168 hours. No results for input(s): LIPASE, AMYLASE in the last 168 hours. No results for input(s): AMMONIA in the last 168 hours. Coagulation Profile: No results for input(s): INR, PROTIME in the last 168 hours. Cardiac Enzymes: No results for input(s): CKTOTAL, CKMB, CKMBINDEX, TROPONINI in the last 168 hours. BNP (last 3 results) No results for input(s): PROBNP in the last 8760 hours. HbA1C: No results for input(s): HGBA1C in the last 72 hours. CBG: No results for input(s): GLUCAP in the last 168 hours. Lipid Profile: No results for input(s): CHOL, HDL, LDLCALC, TRIG, CHOLHDL, LDLDIRECT in the last 72 hours. Thyroid Function Tests: No results for input(s): TSH, T4TOTAL, FREET4, T3FREE, THYROIDAB in the last 72 hours. Anemia Panel: No results for input(s): VITAMINB12, FOLATE, FERRITIN, TIBC, IRON, RETICCTPCT in the last 72 hours. Urine analysis:    Component Value Date/Time   COLORURINE YELLOW 03/09/2018 1743   APPEARANCEUR CLEAR 03/09/2018 1743   LABSPEC 1.012 03/09/2018 1743   PHURINE 5.0 03/09/2018 1743   GLUCOSEU NEGATIVE 03/09/2018 1743   GLUCOSEU NEGATIVE 01/10/2016 1416   HGBUR SMALL (A) 03/09/2018 1743   BILIRUBINUR NEGATIVE 03/09/2018 1743   KETONESUR NEGATIVE 03/09/2018 1743   PROTEINUR 30 (A) 03/09/2018 1743   UROBILINOGEN 0.2 01/10/2016 1416   NITRITE NEGATIVE 03/09/2018 1743   LEUKOCYTESUR NEGATIVE 03/09/2018 1743   Sepsis Labs: @LABRCNTIP (procalcitonin:4,lacticidven:4) )No results found for this or any previous visit (from the past 240 hour(s)).   Radiological Exams on Admission: No results found.  EKG: Independently reviewed.  Paced rhythm.  Assessment/Plan Principal Problem:   ARF (acute renal failure) (HCC) Active Problems:   Hypothyroidism   Congestive dilated cardiomyopathy (HCC)   HTN (hypertension)    Chronic systolic heart failure (HCC)   Left bundle branch block   Type 2 diabetes mellitus with diabetic neuropathy, with long-term current use of insulin (HCC)   History of stroke  1. Acute renal failure -not sure if patient has restarted taking his inhibitors.  Her home medication list is to be reconsulted.  For now we will gently hydrated closely follow intake output metabolic panel.  If patient's creatinine does not improve may need to have further imaging. 2. Severe low back pain -during last admission recently patient had a T12 L1-L2 compression fracture and have was on TLSO brace.  Patient states she had a repeat fall and is complaining of severe pain last 2 days.  I will repeat x-rays of the spine and pelvis.  If it does show fractures will get MRI.  Pain relief medications. 3. Hypertension -home medicine this does not reconsult but I have placed patient on PRN IV hydralazine for now. 4. Diabetes mellitus type 2 until we get home medication list I kept patient on sliding scale coverage.  Last hemoglobin A1c was around 8.8 per discharge summary. 5. Chronic combined systolic and diastolic heart failure appears dehydrated at this time on fluids.  Last EF measured was around 55 to 60% with grade 1 diastolic dysfunction. 6. Hypothyroidism on Synthroid. 7. History of pacemaker placement LBBB.   DVT prophylaxis: Heparin. Code Status: Full code. Family Communication: Discussed with patient. Disposition Plan: To be determined. Consults called: None. Admission status: Inpatient.   Eduard Clos MD [facility] Pager [phone].  If 7PM-7AM, please contact night-coverage [email or web address] Password [facility]  03/09/2018, 9:37 PM

## 2018-03-09 NOTE — ED Notes (Signed)
Bladder Scan volume 157

## 2018-03-09 NOTE — ED Notes (Signed)
Attempted report 

## 2018-03-09 NOTE — ED Triage Notes (Signed)
Pt recently discharged out of a nursing home rehab. After therapy on Saturday, pt had low bp, nausea and has been fatigued/dehydration. Pt followed up with doctors office today which they sent her here for elevated creatinine. Pt has been ambulating with a walker, A&Ox4.

## 2018-03-09 NOTE — ED Notes (Signed)
Urine culture sent down with urine sample 

## 2018-03-09 NOTE — ED Notes (Signed)
Sons would like to be called with bed assignment update.

## 2018-03-09 NOTE — ED Provider Notes (Signed)
Patient placed in Quick Look pathway, seen and evaluated   Chief Complaint: Sent from PCP, dehydration, fatigue, elevated creatinine  HPI:   Patient recently discharged from rehab, was noted after therapy on Saturday to be having some low blood pressures and nausea, has been complaining of fatigue.  They sent her to the doctor's office for follow-up today she was noted to have elevated creatinine.  She has been ambulatory with her walker.  She denies chest pain or shortness of breath, no abdominal pain.  Family at bedside reports she seems generally weaker than usual and more tired.  ROS: + Fatigue, generalized weakness, dehydration. -Chest pain, shortness of breath, abdominal pain, vomiting  Physical Exam:   Gen: No distress, somewhat cachectic  Neuro: Awake and Alert  Skin: Warm    Focused Exam: Heart with regular rate and rhythm, lungs clear to auscultation bilaterally, abdomen soft, nontender to palpation throughout   Initiation of care has begun. The patient has been counseled on the process, plan, and necessity for staying for the completion/evaluation, and the remainder of the medical screening examination    Legrand Rams 03/09/18 Dominic Pea, MD 03/09/18 [phone]

## 2018-03-09 NOTE — ED Provider Notes (Signed)
[facility] EMERGENCY DEPARTMENT Provider Note   CSN: [id number] Arrival date & time: 03/09/18  1619     History   Chief Complaint Chief Complaint  Patient presents with  . Abnormal Lab  . Dehydration    HPI [name] [name] is a 84 y.o. female.  HPI 84 yo female recently admitted with ARF. Improved in the hospital. Now back with worsening generalized weakness and poor oral intake. Found to have worsening renal function once again. Sounds as though there was confusion about halting her ACE inhibitor and this has been continued at home. Poor appetitite. No fevers. No CP or SOB. Still making urine   Past Medical History:  Diagnosis Date  . Chronic combined systolic and diastolic CHF, NYHA class 3 (HCC)    a. 03/2013 Echo: EF 25-30%, diff HK, sev antsept HK, mild MR.  . Congestive dilated cardiomyopathy (HCC)    a. 03/2013 Echo: EF 25-30%;  b. 09/2013 s/p SJM 3242 CRT-P.  Marland Kitchen Coronary artery disease    a. 08/2012 Cath: LM nl, LAD 50m, LCX nondom, mild-mod nonobs dzs mid, OM1 ok, OM2 90/90p, RCA 40, EF 25-30%-->Med Rx.  . GERD (gastroesophageal reflux disease)   . High cholesterol   . Hypertension   . Hypothyroidism   . Left bundle branch block   . Osteoarthritis   . Stroke (HCC)   . Type II diabetes mellitus [facility])     Patient Active Problem List   Diagnosis Date Noted  . Acute renal failure superimposed on chronic kidney disease (HCC) 02/21/2018  . Acute metabolic encephalopathy 02/21/2018  . Pyuria 02/21/2018  . Compression fracture of L1 lumbar vertebra (HCC) 02/21/2018  . Compression fracture of L2 lumbar vertebra (HCC) 02/21/2018  . Compression fracture of T12 vertebra (HCC) 02/21/2018  . History of stroke 02/21/2018  . AKI (acute kidney injury) (HCC) 02/09/2018  . Encounter for post-traumatic wound check 12/16/2016  . Critical lower limb ischemia 06/17/2016  . Diabetic neuropathy, painful (HCC) 04/24/2016  . Left carotid artery stenosis   .  Hypertriglyceridemia, essential 01/10/2016  . 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 Type 2 diabetes mellitus with diabetic neuropathy, with long-term current use of insulin (HCC) 10/06/2015  . Deficiency anemia 03/10/2014  . Osteoporosis, unspecified 03/10/2014  . Left bundle branch block 07/01/2013  . Coronary atherosclerosis of native coronary artery 02/16/2013  . Chronic systolic heart failure (HCC) 08/16/2012  . Congestive dilated cardiomyopathy (HCC) 08/02/2012  . HTN (hypertension) 08/02/2012  . Familial tremor 11/29/2010  . VITAMIN D DEFICIENCY 04/04/2010  . Osteoarthrosis, unspecified whether generalized or localized, unspecified site 04/04/2010  . Hyperlipidemia with target LDL less than 70 01/19/2007  . Hypothyroidism 01/14/2007    Past Surgical History:  Procedure Laterality Date  . ABDOMINAL HYSTERECTOMY  1980  . BI-VENTRICULAR PACEMAKER INSERTION N/A 10/01/2013   Procedure: BI-VENTRICULAR PACEMAKER INSERTION (CRT-P);  Surgeon: Duke Salvia, MD;  Location: [facility] CATH LAB;  Service: Cardiovascular;  Laterality: N/A;  . BI-VENTRICULAR PACEMAKER INSERTION (CRT-P)     a. 09/2013 s/p SJM 3242 CRT-P.  . Bilateral cateract surgery    . BMD  2006  . IR GENERIC HISTORICAL  10/18/2016   IR ANGIO INTRA EXTRACRAN SEL COM CAROTID INNOMINATE BILAT MOD SED 10/18/2016 Julieanne Cotton, MD [facility]-INTERV RAD  . IR GENERIC HISTORICAL  10/18/2016   IR ANGIO VERTEBRAL SEL SUBCLAVIAN INNOMINATE BILAT MOD SED 10/18/2016 Julieanne Cotton, MD [facility]  . ORIF TIBIA PLATEAU Right 12/04/2013  Procedure: OPEN REDUCTION INTERNAL FIXATION (ORIF) TIBIAL PLATEAU;  Surgeon: Verlee Rossetti, MD;  Location: [facility] ORS;  Service: Orthopedics;  Laterality: Right;  . PERIPHERAL VASCULAR CATHETERIZATION N/A 06/17/2016   Procedure: Lower Extremity Angiography;  Surgeon: Runell Gess, MD;  Location: [facility] INVASIVE CV LAB;  Service: Cardiovascular;  Laterality: N/A;  .  PERIPHERAL VASCULAR CATHETERIZATION Left 06/17/2016   Procedure: Peripheral Vascular Atherectomy;  Surgeon: Runell Gess, MD;  Location: [facility] INVASIVE CV LAB;  Service: Cardiovascular;  Laterality: Left;  Popliteal   . RIGHT HEART CATHETERIZATION N/A 09/08/2012   Procedure: RIGHT HEART CATH;  Surgeon: Dolores Patty, MD;  Location: [facility] CATH LAB;  Service: Cardiovascular;  Laterality: N/A;     OB History   None      Home Medications    Prior to Admission medications   Medication Sig Start Date End Date Taking? Authorizing Provider  acetaminophen (TYLENOL) 325 MG tablet Take 2 tablets (650 mg total) by mouth every 6 (six) hours as needed. 02/14/18 03/16/18  Zigmund Daniel., MD  aspirin EC 81 MG tablet Take 81 mg by mouth daily.     [provider]  carvedilol (COREG) 12.5 MG tablet Take 6.25 mg by mouth 2 (two) times daily.    [provider]  Cholecalciferol (VITAMIN D3) 5000 units CAPS Take by mouth. Take 1 capsule by mouth every 7 days    [provider]  clopidogrel (PLAVIX) 75 MG tablet TAKE 1 TABLET BY MOUTH ONCE DAILY 01/12/18   [name], MD  FREESTYLE LITE test strip USE 1 STRIP TO CHECK GLUCOSE TWICE DAILY 02/09/18   [name], MD  insulin aspart (NOVOLOG) 100 UNIT/ML injection Inject 0-9 Units into the skin 3 (three) times daily with meals. 02/14/18   Zigmund Daniel., MD  levothyroxine (SYNTHROID, LEVOTHROID) 50 MCG tablet TAKE 1 TABLET BY MOUTH ONCE DAILY 09/01/17   Carlus Pavlov, MD  lidocaine (LIDODERM) 5 % Place 1 patch onto the skin daily. Remove & Discard patch within 12 hours or as directed by MD 02/15/18   Zigmund Daniel., MD  magnesium oxide (MAG-OX) 400 (241.3 Mg) MG tablet Take 1 tablet (400 mg total) by mouth daily. 02/15/18 03/17/18  Zigmund Daniel., MD  Multiple Vitamin (MULTIVITAMIN WITH MINERALS) TABS tablet Take 1 tablet by mouth daily. 02/15/18 03/17/18  Zigmund Daniel., MD  OxyCODONE HCl, Abuse Deter,  (OXAYDO) 5 MG TABA Take by mouth. take 1/2 tab (2.5 mg ) po every 6 hours as scheduled    [provider]  rosuvastatin (CRESTOR) 20 MG tablet TAKE 1 TABLET BY MOUTH ONCE DAILY 01/20/18   [name], MD    Family History Family History  Problem Relation Age of Onset  . Diabetes Mother   . Diabetes Other   . Hypertension Other   . Uterine cancer Other   . Heart attack Brother     Social History Social History   Tobacco Use  . Smoking status: Former Games developer  . Smokeless tobacco: Former Engineer, water Use Topics  . Alcohol use: No  . Drug use: No     Allergies   Statins; Metformin and related; and Vytorin [ezetimibe-simvastatin]   Review of Systems Review of Systems  All other systems reviewed and are negative.    Physical Exam Updated Vital Signs BP 118/75   Pulse 61   Temp 98.6 F (37 C) (Oral)   Resp 12   Ht  5\' 3"  (1.6 m)   Wt 57.6 kg (127 lb)   SpO2 100%   BMI 22.50 kg/m   Physical Exam  Constitutional: She is oriented to person, place, and time. She appears well-developed and well-nourished. No distress.  HENT:  Head: Normocephalic and atraumatic.  Eyes: EOM are normal.  Neck: Normal range of motion.  Cardiovascular: Normal rate, regular rhythm and normal heart sounds.  Pulmonary/Chest: Effort normal and breath sounds normal.  Abdominal: Soft. She exhibits no distension. There is no tenderness.  Musculoskeletal: Normal range of motion.  Neurological: She is alert and oriented to person, place, and time.  Skin: Skin is warm and dry.  Psychiatric: She has a normal mood and affect. Judgment normal.  Nursing note and vitals reviewed.    ED Treatments / Results  Labs (all labs ordered are listed, but only abnormal results are displayed) Labs Reviewed  BASIC METABOLIC PANEL - Abnormal; Notable for the following components:      Result Value   CO2 19 (*)    Glucose, Bld 216 (*)    BUN 43 (*)    Creatinine, Ser 4.58 (*)    GFR calc  non Af Amer 8 (*)    GFR calc Af Amer 9 (*)    All other components within normal limits  CBC - Abnormal; Notable for the following components:   MCH 25.8 (*)    RDW 16.3 (*)    All other components within normal limits  URINALYSIS, ROUTINE W REFLEX MICROSCOPIC - Abnormal; Notable for the following components:   Hgb urine dipstick SMALL (*)    Protein, ur 30 (*)    Bacteria, UA RARE (*)    All other components within normal limits   BUN  Date Value Ref Range Status  03/09/2018 43 (H) 8 - 23 mg/dL Final  16/05/9603 48 (H) 6 - 23 mg/dL Final  54/04/8118 14 4 - 21 Final  02/14/2018 19 8 - 23 mg/dL Final    Comment:    Please note change in reference range.  02/13/2018 30 (H) 8 - 23 mg/dL Final    Comment:    Please note change in reference range.   Creatinine  Date Value Ref Range Status  02/19/2018 1.3 (A) 0.5 - 1.1 Final   Creat  Date Value Ref Range Status  06/07/2016 1.29 (H) 0.60 - 0.88 mg/dL Final    Comment:      For patients > or = 82 years of age: The upper reference limit for Creatinine is approximately 13% higher for people identified as African-American.      Creatinine, Ser  Date Value Ref Range Status  03/09/2018 4.58 (H) 0.44 - 1.00 mg/dL Final  14/78/2956 2.13 Repeated and verified X2. (HH) 0.40 - 1.20 mg/dL Final  08/65/7846 9.62 (H) 0.44 - 1.00 mg/dL Final  95/28/4132 4.40 (H) 0.44 - 1.00 mg/dL Final     EKG None  Radiology No results found.  Procedures Procedures (including critical care time)  Medications Ordered in ED Medications  sodium chloride 0.9 % bolus 1,000 mL (1,000 mLs Intravenous New Bag/Given 03/09/18 2021)     Initial Impression / Assessment and Plan / ED Course  I have reviewed the triage vital signs and the nursing notes.  Pertinent labs & imaging results that were available during my care of the patient were reviewed by me and considered in my medical decision making (see chart for details).     Acute on chronic  renal failure. IVFs now.  Admit for additional fluids and workup  Final Clinical Impressions(s) / ED Diagnoses   Final diagnoses:  Acute renal failure, unspecified acute renal failure type [facility])    ED Discharge Orders    None       Azalia Bilis, MD 03/09/18 2059

## 2018-03-09 NOTE — Telephone Encounter (Signed)
CRITICAL VALUE STICKER  CRITICAL VALUE: Creatinine 4.84 and GFR 11.07  RECEIVER (on-site recipient of call): Graciemae Delisle,RN  DATE & TIME NOTIFIED: 03/09/18 1447  MESSENGER (representative from lab):Karen/lab  MD NOTIFIED: Ria Clock, NP  TIME OF NOTIFICATION:03/09/18 1448  RESPONSE: pending

## 2018-03-10 DIAGNOSIS — I420 Dilated cardiomyopathy: Secondary | ICD-10-CM

## 2018-03-10 LAB — BASIC METABOLIC PANEL
ANION GAP: 12 (ref 5–15)
BUN: 35 mg/dL — ABNORMAL HIGH (ref 8–23)
CO2: 20 mmol/L — AB (ref 22–32)
Calcium: 9 mg/dL (ref 8.9–10.3)
Chloride: 109 mmol/L (ref 98–111)
Creatinine, Ser: 3.22 mg/dL — ABNORMAL HIGH (ref 0.44–1.00)
GFR calc non Af Amer: 13 mL/min — ABNORMAL LOW (ref >60)
GFR, EST AFRICAN AMERICAN: 14 mL/min — AB (ref >60)
GLUCOSE: 221 mg/dL — AB (ref 70–99)
Potassium: 5.3 mmol/L — ABNORMAL HIGH (ref 3.5–5.1)
Sodium: 141 mmol/L (ref 135–145)

## 2018-03-10 LAB — CBC WITH DIFFERENTIAL/PLATELET
Abs Immature Granulocytes: 0 10*3/uL (ref 0.0–0.1)
BASOS ABS: 0 10*3/uL (ref 0.0–0.1)
Basophils Relative: 1 %
EOS ABS: 0.1 10*3/uL (ref 0.0–0.7)
Eosinophils Relative: 2 %
HCT: 40.5 % (ref 36.0–46.0)
Hemoglobin: 12.4 g/dL (ref 12.0–15.0)
Immature Granulocytes: 0 %
Lymphocytes Relative: 46 %
Lymphs Abs: 2.8 10*3/uL (ref 0.7–4.0)
MCH: 25.4 pg — ABNORMAL LOW (ref 26.0–34.0)
MCHC: 30.6 g/dL (ref 30.0–36.0)
MCV: 82.8 fL (ref 78.0–100.0)
MONO ABS: 0.4 10*3/uL (ref 0.1–1.0)
MONOS PCT: 7 %
NEUTROS ABS: 2.6 10*3/uL (ref 1.7–7.7)
NEUTROS PCT: 44 %
PLATELETS: 188 10*3/uL (ref 150–400)
RBC: 4.89 MIL/uL (ref 3.87–5.11)
RDW: 16.2 % — AB (ref 11.5–15.5)
WBC: 6 10*3/uL (ref 4.0–10.5)

## 2018-03-10 LAB — GLUCOSE, CAPILLARY
GLUCOSE-CAPILLARY: 221 mg/dL — AB (ref 70–99)
Glucose-Capillary: 199 mg/dL — ABNORMAL HIGH (ref 70–99)
Glucose-Capillary: 82 mg/dL (ref 70–99)
Glucose-Capillary: 75 mg/dL (ref 70–99)

## 2018-03-10 LAB — HEPATIC FUNCTION PANEL
ALBUMIN: 3.2 g/dL — AB (ref 3.5–5.0)
ALT: 20 U/L (ref 0–44)
AST: 25 U/L (ref 15–41)
Alkaline Phosphatase: 108 U/L (ref 38–126)
BILIRUBIN DIRECT: 0.2 mg/dL (ref 0.0–0.2)
Indirect Bilirubin: 0.5 mg/dL (ref 0.3–0.9)
Total Bilirubin: 0.7 mg/dL (ref 0.3–1.2)
Total Protein: 7.3 g/dL (ref 6.5–8.1)

## 2018-03-10 MED ORDER — HALOPERIDOL LACTATE 5 MG/ML IJ SOLN
1.0000 mg | Freq: Once | INTRAMUSCULAR | Status: AC
  Administered 2018-03-10: 1 mg via INTRAVENOUS
  Filled 2018-03-10: qty 1

## 2018-03-10 MED ORDER — HYDROMORPHONE HCL 1 MG/ML IJ SOLN
1.0000 mg | Freq: Once | INTRAMUSCULAR | Status: AC
  Administered 2018-03-10: 1 mg via INTRAVENOUS
  Filled 2018-03-10: qty 1

## 2018-03-10 MED ORDER — QUETIAPINE FUMARATE 25 MG PO TABS
25.0000 mg | ORAL_TABLET | Freq: Two times a day (BID) | ORAL | Status: DC
  Administered 2018-03-10 – 2018-03-13 (×6): 25 mg via ORAL
  Filled 2018-03-10 (×7): qty 1

## 2018-03-10 MED ORDER — SODIUM CHLORIDE 0.9 % IV BOLUS
500.0000 mL | Freq: Once | INTRAVENOUS | Status: AC
  Administered 2018-03-10: 500 mL via INTRAVENOUS

## 2018-03-10 MED ORDER — FENTANYL CITRATE (PF) 100 MCG/2ML IJ SOLN
12.5000 ug | INTRAMUSCULAR | Status: DC | PRN
  Administered 2018-03-10 – 2018-03-11 (×4): 12.5 ug via INTRAVENOUS
  Filled 2018-03-10 (×4): qty 2

## 2018-03-10 MED ORDER — SENNA 8.6 MG PO TABS
1.0000 | ORAL_TABLET | Freq: Every day | ORAL | Status: DC
  Administered 2018-03-10: 8.6 mg via ORAL
  Filled 2018-03-10: qty 1

## 2018-03-10 MED ORDER — SODIUM POLYSTYRENE SULFONATE 15 GM/60ML PO SUSP
15.0000 g | Freq: Once | ORAL | Status: AC
Start: 2018-03-10 — End: 2018-03-10
  Administered 2018-03-10: 15 g via ORAL
  Filled 2018-03-10: qty 60

## 2018-03-10 NOTE — Care Management Note (Signed)
Case Management Note  Patient Details  Name: [name] [name] MRN: [id number] Date of Birth: Mar 18, 1934  Subjective/Objective:                    Action/Plan: Patient from home active with [facility] for  services: RN, PT, OT, MSW and HHA.  Will need new orders and face to face to resume services    Expected Discharge Date:  03/11/18               Expected Discharge Plan:  Home w Home Health Services  In-House Referral:     Discharge planning Services  CM Consult  Post Acute Care Choice:    Choice offered to:  Patient  DME Arranged:    DME Agency:     HH Arranged:    HH Agency:     Status of Service:  In process, will continue to follow  If discussed at Long Length of Stay Meetings, dates discussed:    Additional Comments:  Kingsley Plan, RN 03/10/2018, 11:33 AM

## 2018-03-10 NOTE — Progress Notes (Signed)
[facility]  Patient Status: Active (receiving services up to time of hospitalization)  [facility] is providing the following services: RN, PT, OT, MSW and HHA  If patient discharges after hours, please call [phone].   [name] [name] 03/10/2018, 10:15 AM

## 2018-03-10 NOTE — Progress Notes (Signed)
Patient has pacemaker that is not MRI conditional. Will not be able to perform the MRI. Dr Margo Aye and RN notified. Exam will be cancelled.

## 2018-03-10 NOTE — Progress Notes (Signed)
Patient awake all night. Patient alert and oriented x 3. " I am at [facility]. " Told patient to get some sleep.

## 2018-03-10 NOTE — Progress Notes (Signed)
Patient continues to scream of pain. MD made aware. New orders received.

## 2018-03-10 NOTE — Progress Notes (Signed)
New Admission Note:  Arrival Method: Stretcher Mental Orientation: Alert and oriented x 3-4 Telemetry: Box 15 Assessment: Completed Skin: Warm and dry IV: NSL Pain: 10/10 LLback Tubes: N/A Safety Measures: Safety Fall Prevention Plan initiated.  Admission: Completed 5 M  Orientation: Patient has been orientated to the room, unit and the staff. Family: None  Orders have been reviewed and implemented. Will continue to monitor the patient. Call light has been placed within reach and bed alarm has been activated.   Guilford Shi BSN, RN  Phone Number: [phone]

## 2018-03-10 NOTE — Progress Notes (Signed)
PROGRESS NOTE  [name] [name] ZOX:[id number] DOB: 02-03-1934 DOA: 03/09/2018 PCP: [name], MD  HPI/Recap of past 24 hours: [name] [name] is a 84 y.o. female with history of diabetes mellitus type 2, CKD 4, hypertension, hypothyroidism, chronic combined CHF who was recently admitted and discharged on February 14, 2018 after patient was admitted for acute renal failure at that time patient's creatinine was 8 which improved to 2 at the time of discharge and further improved to 1.3 was eventually discharged to rehab and patient has just discharged from rehab recently was found to have fatigue and weakness had not followed up with her primary care and routine labs showed creatinine worsened to 4.8. Patient is acute renal failure was attributed during last admission to NSAIDs dehydration and ACE inhibitor. Admitted for acute kidney injury on CKD 4.   03/10/18: Reports diffused abdominal pain with no nausea. Age indeterminate T11 compression fracture and chronic compression fractures at T12,L1, L2. MRI spine canceled due to AICD not compatible with MRI.    Assessment/Plan: Principal Problem:   ARF (acute renal failure) (HCC) Active Problems:   Hypothyroidism   Congestive dilated cardiomyopathy (HCC)   HTN (hypertension)   Chronic systolic heart failure (HCC)   Left bundle branch block   Type 2 diabetes mellitus with diabetic neuropathy, with long-term current use of insulin (HCC)   History of stroke  AKI on CKD 4, improving Baseline cr 2.2 with GFR 26 Cr trending down Avoid nephrotoxic agents Renally dose meds to avoid toxicity Monitor urine output Repeat BMP in the am  Age indeterminate T11 compression fracture/chronic compression fractures at T12,L1, L2 MRI spine canceled 03/10/18 due to AICD not compatible with MRI. C/w TLSO  Ambulatory dysfunction PT/OT to assess Fall precautions  Hyperkalemia suspect secondary to renal failure Potassium 5.3 Give Kayexalate and repeat BMP in  the morning  Moderate protein calorie malnutrition Albumin 3.2 Encourage increase protein calorie intake   Code Status: Full code  Family Communication: None at bedside  Disposition Plan: SNF in 2-3 days, pending clinical improvement   Consultants:  None  Procedures: None  Antimicrobials:  None  DVT prophylaxis: Subcu heparin 3 times daily   Objective: Vitals:   03/09/18 2145 03/09/18 2219 03/10/18 0533 03/10/18 1533  BP: (!) 145/70 138/69 (!) 187/85 (!) 158/75  Pulse: 64 64 72 67  Resp: 13 19 15 16   Temp:  97.7 F (36.5 C) 98.9 F (37.2 C) 98.4 F (36.9 C)  TempSrc:  Oral  Oral  SpO2: 100% 100% 100% 100%  Weight:  57.6 kg (127 lb)    Height:        Intake/Output Summary (Last 24 hours) at 03/10/2018 1711 Last data filed at 03/10/2018 1400 Gross per 24 hour  Intake 1770 ml  Output 2150 ml  Net -380 ml   Filed Weights   03/09/18 1711 03/09/18 2219  Weight: 57.6 kg (127 lb) 57.6 kg (127 lb)    Exam:  . General: 84 y.o. year-old female well developed well nourished in no acute distress.  Alert and oriented x2. . Cardiovascular: Regular rate and rhythm with no rubs or gallops.  No thyromegaly or JVD noted.   Marland Kitchen Respiratory: Clear to auscultation with no wheezes or rales. Good inspiratory effort. . Abdomen: Soft nontender nondistended with normal bowel sounds x4 quadrants. . Musculoskeletal: No lower extremity edema. 2/4 pulses in all 4 extremities. Marland Kitchen Psychiatry: Mood is appropriate for condition and setting   Data Reviewed: CBC: Recent Labs  Lab 03/09/18  1128 03/09/18 1722 03/10/18 0607  WBC 5.6 6.7 6.0  NEUTROABS 2.3  --  2.6  HGB 12.3 12.5 12.4  HCT 38.0 40.6 40.5  MCV 81.2 83.7 82.8  PLT 181.0 182 188   Basic Metabolic Panel: Recent Labs  Lab 03/09/18 1128 03/09/18 1722 03/10/18 0607  NA 139 135 141  K 4.6 4.6 5.3*  CL 102 103 109  CO2 21 19* 20*  GLUCOSE 171* 216* 221*  BUN 48* 43* 35*  CREATININE 4.84 Repeated and verified  X2.* 4.58* 3.22*  CALCIUM 9.4 9.0 9.0   GFR: Estimated Creatinine Clearance: 11.3 mL/min (A) (by C-G formula based on SCr of 3.22 mg/dL (H)). Liver Function Tests: Recent Labs  Lab 03/10/18 0607  AST 25  ALT 20  ALKPHOS 108  BILITOT 0.7  PROT 7.3  ALBUMIN 3.2*   No results for input(s): LIPASE, AMYLASE in the last 168 hours. No results for input(s): AMMONIA in the last 168 hours. Coagulation Profile: No results for input(s): INR, PROTIME in the last 168 hours. Cardiac Enzymes: No results for input(s): CKTOTAL, CKMB, CKMBINDEX, TROPONINI in the last 168 hours. BNP (last 3 results) No results for input(s): PROBNP in the last 8760 hours. HbA1C: No results for input(s): HGBA1C in the last 72 hours. CBG: Recent Labs  Lab 03/09/18 2243 03/10/18 0727 03/10/18 1131 03/10/18 1626  GLUCAP 152* 221* 199* 82   Lipid Profile: No results for input(s): CHOL, HDL, LDLCALC, TRIG, CHOLHDL, LDLDIRECT in the last 72 hours. Thyroid Function Tests: No results for input(s): TSH, T4TOTAL, FREET4, T3FREE, THYROIDAB in the last 72 hours. Anemia Panel: No results for input(s): VITAMINB12, FOLATE, FERRITIN, TIBC, IRON, RETICCTPCT in the last 72 hours. Urine analysis:    Component Value Date/Time   COLORURINE YELLOW 03/09/2018 1743   APPEARANCEUR CLEAR 03/09/2018 1743   LABSPEC 1.012 03/09/2018 1743   PHURINE 5.0 03/09/2018 1743   GLUCOSEU NEGATIVE 03/09/2018 1743   GLUCOSEU NEGATIVE 01/10/2016 1416   HGBUR SMALL (A) 03/09/2018 1743   BILIRUBINUR NEGATIVE 03/09/2018 1743   KETONESUR NEGATIVE 03/09/2018 1743   PROTEINUR 30 (A) 03/09/2018 1743   UROBILINOGEN 0.2 01/10/2016 1416   NITRITE NEGATIVE 03/09/2018 1743   LEUKOCYTESUR NEGATIVE 03/09/2018 1743   Sepsis Labs: @LABRCNTIP (procalcitonin:4,lacticidven:4)  )No results found for this or any previous visit (from the past 240 hour(s)).    Studies: Dg Thoracic Spine 2 View  Result Date: 03/09/2018 CLINICAL DATA:  Fall 2 days ago  with back pain. EXAM: THORACIC SPINE 2 VIEWS COMPARISON:  Included portion from lumbar spine CT 02/03/2018. Chest radiograph 01/10/2016 FINDINGS: Chronic and unchanged T12 and upper lumbar compression fractures compared with recent lumbar spine CT. T11 compression fracture with approximately 30% loss of height anteriorly was not included in the field of view on prior exam and is age indeterminate, but appears new from 2017 chest radiograph. Mild scoliotic curvature of the upper thoracic spine. IMPRESSION: 1. Age indeterminate T11 compression fracture with approximately 30% loss of height anteriorly. This is new from 2017 chest radiograph. 2. Unchanged compression fractures of T12 and the upper lumbar spine since CT last month. Electronically Signed   By: Rubye Oaks M.D.   On: 03/09/2018 23:53   Dg Lumbar Spine 2-3 Views  Result Date: 03/09/2018 CLINICAL DATA:  Fall 2 days ago with back pain. EXAM: LUMBAR SPINE - 2-3 VIEW COMPARISON:  Lumbar spine CT 02/03/2018 FINDINGS: Unchanged compression fractures of T12, L1, and L2 from prior exam. No significant progression. Compression fracture of T11 with approximately  30% loss of height was not previously included in the field of view and is age indeterminate, not definitely seen on chest radiograph of 01/10/2016. L3, L4, and L5 vertebral body heights are preserved. Similar anterolisthesis of L3 on L4. Posterior elements appear intact. Sacroiliac joints are congruent. Chronic angulation of the sacrococcygeal junction based on scout from prior lumbar spine CT. Advanced vascular calcifications in the abdomen. IMPRESSION: 1. Chronic and unchanged compression fractures of T12, L1, and L2 compared to lumbar spine CT last month. 2. T11 compression fracture with approximately 30% loss of height anteriorly was not previously included in the field of view and is age indeterminate. Electronically Signed   By: Rubye Oaks M.D.   On: 03/09/2018 23:49   Dg Pelvis 1-2  Views  Result Date: 03/09/2018 CLINICAL DATA:  Fall 2 days ago with back pain. EXAM: PELVIS - 1-2 VIEW COMPARISON:  None. FINDINGS: The cortical margins of the bony pelvis are intact. No fracture. Pubic symphysis and sacroiliac joints are congruent. Both femoral heads are well-seated in the respective acetabula. The bones are under mineralized. IMPRESSION: No visualized pelvic fracture. Electronically Signed   By: Rubye Oaks M.D.   On: 03/09/2018 23:50    Scheduled Meds: . aspirin EC  81 mg Oral Daily  . clopidogrel  75 mg Oral Daily  . heparin  5,000 Units Subcutaneous Q8H  . insulin aspart  0-9 Units Subcutaneous TID WC  . levothyroxine  50 mcg Oral QAC breakfast  . lidocaine  1 patch Transdermal Q24H  . QUEtiapine  25 mg Oral BID  . rosuvastatin  20 mg Oral Daily  . senna  1 tablet Oral Daily    Continuous Infusions: . sodium chloride 100 mL/hr at 03/10/18 1240     LOS: 1 day     Darlin Drop, MD [facility] Pager [phone]  If 7PM-7AM, please contact night-coverage [email or web address] Password [email or web address] 03/10/2018, 5:11 PM

## 2018-03-11 ENCOUNTER — Inpatient Hospital Stay (HOSPITAL_COMMUNITY): Payer: Medicare Other

## 2018-03-11 DIAGNOSIS — I447 Left bundle-branch block, unspecified: Secondary | ICD-10-CM

## 2018-03-11 LAB — CBC
HCT: 40.2 % (ref 36.0–46.0)
Hemoglobin: 12.6 g/dL (ref 12.0–15.0)
MCH: 25.6 pg — ABNORMAL LOW (ref 26.0–34.0)
MCHC: 31.3 g/dL (ref 30.0–36.0)
MCV: 81.5 fL (ref 78.0–100.0)
Platelets: 185 10*3/uL (ref 150–400)
RBC: 4.93 MIL/uL (ref 3.87–5.11)
RDW: 15.9 % — ABNORMAL HIGH (ref 11.5–15.5)
WBC: 7.3 10*3/uL (ref 4.0–10.5)

## 2018-03-11 LAB — BASIC METABOLIC PANEL
Anion gap: 14 (ref 5–15)
BUN: 15 mg/dL (ref 8–23)
CHLORIDE: 107 mmol/L (ref 98–111)
CO2: 20 mmol/L — ABNORMAL LOW (ref 22–32)
Calcium: 9.1 mg/dL (ref 8.9–10.3)
Creatinine, Ser: 2.05 mg/dL — ABNORMAL HIGH (ref 0.44–1.00)
GFR calc Af Amer: 25 mL/min — ABNORMAL LOW (ref >60)
GFR calc non Af Amer: 22 mL/min — ABNORMAL LOW (ref >60)
Glucose, Bld: 99 mg/dL (ref 70–99)
POTASSIUM: 3.3 mmol/L — AB (ref 3.5–5.1)
Sodium: 141 mmol/L (ref 135–145)

## 2018-03-11 LAB — GLUCOSE, CAPILLARY
GLUCOSE-CAPILLARY: 107 mg/dL — AB (ref 70–99)
Glucose-Capillary: 161 mg/dL — ABNORMAL HIGH (ref 70–99)
Glucose-Capillary: 102 mg/dL — ABNORMAL HIGH (ref 70–99)
Glucose-Capillary: 98 mg/dL (ref 70–99)

## 2018-03-11 MED ORDER — SENNA 8.6 MG PO TABS
1.0000 | ORAL_TABLET | Freq: Two times a day (BID) | ORAL | Status: DC
  Administered 2018-03-11 – 2018-03-13 (×4): 8.6 mg via ORAL
  Filled 2018-03-11 (×5): qty 1

## 2018-03-11 MED ORDER — POLYETHYLENE GLYCOL 3350 17 G PO PACK
17.0000 g | PACK | Freq: Every day | ORAL | Status: DC
  Administered 2018-03-11: 17 g via ORAL
  Filled 2018-03-11 (×2): qty 1

## 2018-03-11 MED ORDER — POTASSIUM CHLORIDE CRYS ER 20 MEQ PO TBCR
20.0000 meq | EXTENDED_RELEASE_TABLET | Freq: Once | ORAL | Status: AC
  Administered 2018-03-11: 20 meq via ORAL
  Filled 2018-03-11: qty 1

## 2018-03-11 MED ORDER — FENTANYL 25 MCG/HR TD PT72
25.0000 ug | MEDICATED_PATCH | TRANSDERMAL | Status: DC
  Administered 2018-03-11: 25 ug via TRANSDERMAL
  Filled 2018-03-11: qty 1

## 2018-03-11 NOTE — Plan of Care (Signed)
  Problem: Education: Goal: Knowledge of General Education information will improve Description Including pain rating scale, medication(s)/side effects and non-pharmacologic comfort measures Outcome: Not Progressing Note:  Pt. very confused; reviewed POC with pt.- unsure how much she understands.

## 2018-03-11 NOTE — Consult Note (Addendum)
   [facility] CM Inpatient Consult   03/11/2018  [name] [name] Feb 25, 1934 [id number]    Patient screened for potential [facility] Care Management services due to hospital readmission.   Went to beside to speak with patient about [facility] Care Management program. However, [name] was sleeping soundly and family was not present.  Will attempt to follow up at later time for potential [facility] Care Management services.   [facility] Care Management brochure, contact information, and 24-hr nurse advice line magnet left at bedside.  Raiford Noble, MSN-Ed, RN,BSN [facility] Liaison [phone]

## 2018-03-11 NOTE — Progress Notes (Signed)
Haldol and Fentanyl given; pt. still restless and trying to get OOB; paged Kirby,NP for waistbelt and order given. Posey belt placed at 2340.

## 2018-03-11 NOTE — Progress Notes (Signed)
PROGRESS NOTE  [name] [name] ZOX:[id number] DOB: [birth year] DOA: 03/09/2018 PCP: [name], MD  HPI/Recap of past 24 hours: [name] [name] is a 84 y.o. female with history of diabetes mellitus type 2, CKD 4, hypertension, hypothyroidism, chronic combined CHF who was recently admitted and discharged on February 14, 2018 after patient was admitted for acute renal failure at that time patient's creatinine was 8 which improved to 2 at the time of discharge and further improved to 1.3 was eventually discharged to rehab and patient has just discharged from rehab recently was found to have fatigue and weakness had not followed up with her primary care and routine labs showed creatinine worsened to 4.8. Patient is acute renal failure was attributed during last admission to NSAIDs dehydration and ACE inhibitor. Admitted for acute kidney injury on CKD 4.   03/10/18: Reports diffused abdominal pain with no nausea. Age indeterminate T11 compression fracture and chronic compression fractures at T12,L1, L2. MRI spine canceled due to AICD not compatible with MRI.  03/11/2018: Patient seen and examined at her bedside.  She reports severe lower back pain even on pain medications radiating to her left lower extremity.  Fentanyl patch ordered.  Neurosurgery contacted Dr. Remigio Eisenmenger.  Awaiting results of CT thoracic and lumbar spine without contrast.    Assessment/Plan: Principal Problem:   ARF (acute renal failure) (HCC) Active Problems:   Hypothyroidism   Congestive dilated cardiomyopathy (HCC)   HTN (hypertension)   Chronic systolic heart failure (HCC)   Left bundle branch block   Type 2 diabetes mellitus with diabetic neuropathy, with long-term current use of insulin (HCC)   History of stroke  AKI on CKD 4, improving Renal function continues to improve with creatinine 2.05 from 2.2 yesterday Monitor urine output Continue to avoid nephrotoxic agents Obtain BMP in the morning  Severe lower back pain  secondary to age indeterminate T11 compression fracture/chronic compression fractures at T12,L1, L2 MRI spine canceled 03/10/18 due to AICD not compatible with MRI. C/w TLSO Obtain CT thoracic and lumbar spine without contrast (due to CKD 4) Pain management in place Bowel regimen in place Start fentanyl patch 25 mcg every 72 hours Neurosurgery consulted, Dr. Remigio Eisenmenger.  Highly appreciated.  Ambulatory dysfunction Bed rest until neurosurgery input  Hyperkalemia suspect secondary to renal failure, resolved  Moderate protein calorie malnutrition Albumin 3.2 Encourage increase protein calorie intake   Code Status: Full code  Family Communication: None at bedside  Disposition Plan: SNF in 2-3 days, pending clinical improvement   Consultants:  Neurosurgery  Procedures: None  Antimicrobials:  None  DVT prophylaxis: Subcu heparin 3 times daily   Objective: Vitals:   03/10/18 1533 03/10/18 2053 03/11/18 0458 03/11/18 0740  BP: (!) 158/75 (!) 175/83 (!) 171/82 (!) 152/74  Pulse: 67 69 83 78  Resp: 16 19 18 19   Temp: 98.4 F (36.9 C) 98.9 F (37.2 C) 98.3 F (36.8 C)   TempSrc: Oral Oral Oral   SpO2: 100% 100% 100% 100%  Weight:      Height:        Intake/Output Summary (Last 24 hours) at 03/11/2018 1107 Last data filed at 03/11/2018 1005 Gross per 24 hour  Intake 1040 ml  Output 2350 ml  Net -1310 ml   Filed Weights   03/09/18 1711 03/09/18 2219  Weight: 57.6 kg (127 lb) 57.6 kg (127 lb)    Exam:  . General: 84 y.o. year-old female well-developed well-nourished in no acute distress.  Appears uncomfortable due to severe lower  back pain.  Alert and oriented x3. . Cardiovascular: Regular rate and rhythm with no rubs or gallops.  No thyromegaly or JVD noted. Marland Kitchen Respiratory: Clear to auscultation with no wheezes or rales. Good inspiratory effort. . Abdomen: Soft nontender nondistended with normal bowel sounds x4 quadrants. . Musculoskeletal: No lower extremity  edema. 2/4 pulses in all 4 extremities. Marland Kitchen Psychiatry: Mood is appropriate for condition and setting   Data Reviewed: CBC: Recent Labs  Lab 03/09/18 1128 03/09/18 1722 03/10/18 0607 03/11/18 0714  WBC 5.6 6.7 6.0 7.3  NEUTROABS 2.3  --  2.6  --   HGB 12.3 12.5 12.4 12.6  HCT 38.0 40.6 40.5 40.2  MCV 81.2 83.7 82.8 81.5  PLT 181.0 182 188 185   Basic Metabolic Panel: Recent Labs  Lab 03/09/18 1128 03/09/18 1722 03/10/18 0607 03/11/18 0714  NA 139 135 141 141  K 4.6 4.6 5.3* 3.3*  CL 102 103 109 107  CO2 21 19* 20* 20*  GLUCOSE 171* 216* 221* 99  BUN 48* 43* 35* 15  CREATININE 4.84 Repeated and verified X2.* 4.58* 3.22* 2.05*  CALCIUM 9.4 9.0 9.0 9.1   GFR: Estimated Creatinine Clearance: 17.8 mL/min (A) (by C-G formula based on SCr of 2.05 mg/dL (H)). Liver Function Tests: Recent Labs  Lab 03/10/18 0607  AST 25  ALT 20  ALKPHOS 108  BILITOT 0.7  PROT 7.3  ALBUMIN 3.2*   No results for input(s): LIPASE, AMYLASE in the last 168 hours. No results for input(s): AMMONIA in the last 168 hours. Coagulation Profile: No results for input(s): INR, PROTIME in the last 168 hours. Cardiac Enzymes: No results for input(s): CKTOTAL, CKMB, CKMBINDEX, TROPONINI in the last 168 hours. BNP (last 3 results) No results for input(s): PROBNP in the last 8760 hours. HbA1C: No results for input(s): HGBA1C in the last 72 hours. CBG: Recent Labs  Lab 03/10/18 0727 03/10/18 1131 03/10/18 1626 03/10/18 2055 03/11/18 0739  GLUCAP 221* 199* 82 75 107*   Lipid Profile: No results for input(s): CHOL, HDL, LDLCALC, TRIG, CHOLHDL, LDLDIRECT in the last 72 hours. Thyroid Function Tests: No results for input(s): TSH, T4TOTAL, FREET4, T3FREE, THYROIDAB in the last 72 hours. Anemia Panel: No results for input(s): VITAMINB12, FOLATE, FERRITIN, TIBC, IRON, RETICCTPCT in the last 72 hours. Urine analysis:    Component Value Date/Time   COLORURINE YELLOW 03/09/2018 1743    APPEARANCEUR CLEAR 03/09/2018 1743   LABSPEC 1.012 03/09/2018 1743   PHURINE 5.0 03/09/2018 1743   GLUCOSEU NEGATIVE 03/09/2018 1743   GLUCOSEU NEGATIVE 01/10/2016 1416   HGBUR SMALL (A) 03/09/2018 1743   BILIRUBINUR NEGATIVE 03/09/2018 1743   KETONESUR NEGATIVE 03/09/2018 1743   PROTEINUR 30 (A) 03/09/2018 1743   UROBILINOGEN 0.2 01/10/2016 1416   NITRITE NEGATIVE 03/09/2018 1743   LEUKOCYTESUR NEGATIVE 03/09/2018 1743   Sepsis Labs: @LABRCNTIP (procalcitonin:4,lacticidven:4)  )No results found for this or any previous visit (from the past 240 hour(s)).    Studies: No results found.  Scheduled Meds: . aspirin EC  81 mg Oral Daily  . clopidogrel  75 mg Oral Daily  . fentaNYL  25 mcg Transdermal Q72H  . heparin  5,000 Units Subcutaneous Q8H  . insulin aspart  0-9 Units Subcutaneous TID WC  . levothyroxine  50 mcg Oral QAC breakfast  . lidocaine  1 patch Transdermal Q24H  . polyethylene glycol  17 g Oral Daily  . QUEtiapine  25 mg Oral BID  . rosuvastatin  20 mg Oral Daily  . senna  1 tablet Oral BID    Continuous Infusions:    LOS: 2 days     Darlin Drop, MD [facility] Pager [phone]  If 7PM-7AM, please contact night-coverage [email or web address] Password [email or web address] 03/11/2018, 11:07 AM

## 2018-03-11 NOTE — Progress Notes (Signed)
RN called son Yenifer Dreesen # to inform him of pt. being restless all night and had to use waist restraint on pt. Left message for son to call [facility]; and informed Earnie Larsson that son maybe calling

## 2018-03-11 NOTE — Consult Note (Signed)
Reason for Consult: thoracic fractures Referring Physician: [name] is an 84 y.o. female.   HPI:  84 year old female presents to the hospital for fatigue and weakness. Reported some back pain. Family states that she fell about 3-4 weeks ago. She has a history of acute renal failure and was recently discharged from rehab. She denies any pain NT or weakness down her legs. She has some old chronic compression fractures in her lumbar spine.   Past Medical History:  Diagnosis Date  . Chronic combined systolic and diastolic CHF, NYHA class 3 (HCC)    a. 03/2013 Echo: EF 25-30%, diff HK, sev antsept HK, mild MR.  . Congestive dilated cardiomyopathy (HCC)    a. 03/2013 Echo: EF 25-30%;  b. 09/2013 s/p SJM 3242 CRT-P.  Marland Kitchen Coronary artery disease    a. 08/2012 Cath: LM nl, LAD 75m, LCX nondom, mild-mod nonobs dzs mid, OM1 ok, OM2 90/90p, RCA 40, EF 25-30%-->Med Rx.  . GERD (gastroesophageal reflux disease)   . High cholesterol   . Hypertension   . Hypothyroidism   . Left bundle branch block   . Osteoarthritis   . Stroke (HCC)   . Type II diabetes mellitus (HCC)     Past Surgical History:  Procedure Laterality Date  . ABDOMINAL HYSTERECTOMY  1980  . BI-VENTRICULAR PACEMAKER INSERTION N/A 10/01/2013   Procedure: BI-VENTRICULAR PACEMAKER INSERTION (CRT-P);  Surgeon: Duke Salvia, MD;  Location: [facility] CATH LAB;  Service: Cardiovascular;  Laterality: N/A;  . BI-VENTRICULAR PACEMAKER INSERTION (CRT-P)     a. 09/2013 s/p SJM 3242 CRT-P.  . Bilateral cateract surgery    . BMD  2006  . IR GENERIC HISTORICAL  10/18/2016   IR ANGIO INTRA EXTRACRAN SEL COM CAROTID INNOMINATE BILAT MOD SED 10/18/2016 Julieanne Cotton, MD [facility]-INTERV RAD  . IR GENERIC HISTORICAL  10/18/2016   IR ANGIO VERTEBRAL SEL SUBCLAVIAN INNOMINATE BILAT MOD SED 10/18/2016 Julieanne Cotton, MD [facility]  . ORIF TIBIA PLATEAU Right 12/04/2013   Procedure: OPEN REDUCTION INTERNAL FIXATION (ORIF) TIBIAL PLATEAU;  Surgeon: Verlee Rossetti, MD;  Location: [facility] ORS;  Service: Orthopedics;  Laterality: Right;  . PERIPHERAL VASCULAR CATHETERIZATION N/A 06/17/2016   Procedure: Lower Extremity Angiography;  Surgeon: Runell Gess, MD;  Location: [facility] INVASIVE CV LAB;  Service: Cardiovascular;  Laterality: N/A;  . PERIPHERAL VASCULAR CATHETERIZATION Left 06/17/2016   Procedure: Peripheral Vascular Atherectomy;  Surgeon: Runell Gess, MD;  Location: [facility] INVASIVE CV LAB;  Service: Cardiovascular;  Laterality: Left;  Popliteal   . RIGHT HEART CATHETERIZATION N/A 09/08/2012   Procedure: RIGHT HEART CATH;  Surgeon: Dolores Patty, MD;  Location: [facility] CATH LAB;  Service: Cardiovascular;  Laterality: N/A;    Allergies  Allergen Reactions  . Statins Other (See Comments)    Leg cramping  . Metformin And Related     Pt stopped it due to diarrhea  . Vytorin [Ezetimibe-Simvastatin] Other (See Comments)    Leg cramps    Social History   Tobacco Use  . Smoking status: Former Games developer  . Smokeless tobacco: Former Engineer, water Use Topics  . Alcohol use: No    Family History  Problem Relation Age of Onset  . Diabetes Mother   . Diabetes Other   . Hypertension Other   . Uterine cancer Other   . Heart attack Brother      Review of Systems  Positive ROS: back pain  All other systems have been reviewed and were otherwise  negative with the exception of those mentioned in the HPI and as above.  Objective: Vital signs in last 24 hours: Temp:  [98.3 F (36.8 C)-98.9 F (37.2 C)] 98.3 F (36.8 C) (07/31 0458) Pulse Rate:  [67-83] 78 (07/31 0740) Resp:  [16-19] 19 (07/31 0740) BP: (152-175)/(74-83) 152/74 (07/31 0740) SpO2:  [100 %] 100 % (07/31 0740)  General Appearance: Alert, cooperative, no distress, appears stated age Head: Normocephalic, without obvious abnormality, atraumatic Eyes: conjunctiva/corneas clear, EOM's intact, fundi benign, both eyes      Lungs:  respirations unlabored Heart: Regular rate and  rhythm  NEUROLOGIC:   Mental status: A&O x4, no aphasia, good attention span, Memory and fund of knowledge Motor Exam - grossly normal, normal tone and bulk Sensory Exam - grossly normal Reflexes: symmetric, no pathologic reflexes, No Hoffman's, No clonus Coordination - not tested Gait - not tested Balance - not tested Cranial Nerves: I: smell Not tested  II: visual acuity  OS: na  OD: na  II: visual fields Full to confrontation  II: pupils Equal, round, reactive to light  III,VII: ptosis None  III,IV,VI: extraocular muscles  Full ROM  V: mastication   V: facial light touch sensation    V,VII: corneal reflex    VII: facial muscle function - upper    VII: facial muscle function - lower   VIII: hearing   IX: soft palate elevation    IX,X: gag reflex   XI: trapezius strength    XI: sternocleidomastoid strength   XI: neck flexion strength    XII: tongue strength      Data Review Lab Results  Component Value Date   WBC 7.3 03/11/2018   HGB 12.6 03/11/2018   HCT 40.2 03/11/2018   MCV 81.5 03/11/2018   PLT 185 03/11/2018   Lab Results  Component Value Date   NA 141 03/11/2018   K 3.3 (L) 03/11/2018   CL 107 03/11/2018   CO2 20 (L) 03/11/2018   BUN 15 03/11/2018   CREATININE 2.05 (H) 03/11/2018   GLUCOSE 99 03/11/2018   Lab Results  Component Value Date   INR 1.15 02/09/2018    Radiology: Dg Thoracic Spine 2 View  Result Date: 03/09/2018 CLINICAL DATA:  Fall 2 days ago with back pain. EXAM: THORACIC SPINE 2 VIEWS COMPARISON:  Included portion from lumbar spine CT 02/03/2018. Chest radiograph 01/10/2016 FINDINGS: Chronic and unchanged T12 and upper lumbar compression fractures compared with recent lumbar spine CT. T11 compression fracture with approximately 30% loss of height anteriorly was not included in the field of view on prior exam and is age indeterminate, but appears new from 2017 chest radiograph. Mild scoliotic curvature of the upper thoracic spine.  IMPRESSION: 1. Age indeterminate T11 compression fracture with approximately 30% loss of height anteriorly. This is new from 2017 chest radiograph. 2. Unchanged compression fractures of T12 and the upper lumbar spine since CT last month. Electronically Signed   By: Rubye Oaks M.D.   On: 03/09/2018 23:53   Dg Lumbar Spine 2-3 Views  Result Date: 03/09/2018 CLINICAL DATA:  Fall 2 days ago with back pain. EXAM: LUMBAR SPINE - 2-3 VIEW COMPARISON:  Lumbar spine CT 02/03/2018 FINDINGS: Unchanged compression fractures of T12, L1, and L2 from prior exam. No significant progression. Compression fracture of T11 with approximately 30% loss of height was not previously included in the field of view and is age indeterminate, not definitely seen on chest radiograph of 01/10/2016. L3, L4, and L5 vertebral body heights  are preserved. Similar anterolisthesis of L3 on L4. Posterior elements appear intact. Sacroiliac joints are congruent. Chronic angulation of the sacrococcygeal junction based on scout from prior lumbar spine CT. Advanced vascular calcifications in the abdomen. IMPRESSION: 1. Chronic and unchanged compression fractures of T12, L1, and L2 compared to lumbar spine CT last month. 2. T11 compression fracture with approximately 30% loss of height anteriorly was not previously included in the field of view and is age indeterminate. Electronically Signed   By: Rubye Oaks M.D.   On: 03/09/2018 23:49   Dg Pelvis 1-2 Views  Result Date: 03/09/2018 CLINICAL DATA:  Fall 2 days ago with back pain. EXAM: PELVIS - 1-2 VIEW COMPARISON:  None. FINDINGS: The cortical margins of the bony pelvis are intact. No fracture. Pubic symphysis and sacroiliac joints are congruent. Both femoral heads are well-seated in the respective acetabula. The bones are under mineralized. IMPRESSION: No visualized pelvic fracture. Electronically Signed   By: Rubye Oaks M.D.   On: 03/09/2018 23:50    Assessment/Plan: 84 year old  came in with severe back pain after having an acute kidney injury recently. Xrays of thoracic spine revealed a T11 compression fracture as well as some chronic T12, L1 and L2 compression fractures. We will plan to get a CT scan of thoracic spine to rule out any posterior element involvement or bony retropulsion into the canal. Will likely just let this heal in a brace. She does have severe osteoporosis and therefore I do not believe she is a good candidate for any surgical stabilization. Will possibly consider vertebroplasty pending results of CT scan. Brace when up out of bed after CT is done.   [name] [name] 03/11/2018 12:41 PM

## 2018-03-11 NOTE — Progress Notes (Signed)
Pt. with increase restlessness/hollering out; trying to get OOB; paged Kirby,NP at this time and no RTC, and paged again around 2205 and orders given.

## 2018-03-12 DIAGNOSIS — E039 Hypothyroidism, unspecified: Secondary | ICD-10-CM

## 2018-03-12 DIAGNOSIS — I10 Essential (primary) hypertension: Secondary | ICD-10-CM

## 2018-03-12 DIAGNOSIS — I5022 Chronic systolic (congestive) heart failure: Secondary | ICD-10-CM

## 2018-03-12 LAB — CBC
HCT: 36.7 % (ref 36.0–46.0)
Hemoglobin: 11.7 g/dL — ABNORMAL LOW (ref 12.0–15.0)
MCH: 25.5 pg — ABNORMAL LOW (ref 26.0–34.0)
MCHC: 31.9 g/dL (ref 30.0–36.0)
MCV: 80.1 fL (ref 78.0–100.0)
Platelets: 176 10*3/uL (ref 150–400)
RBC: 4.58 MIL/uL (ref 3.87–5.11)
RDW: 15.9 % — ABNORMAL HIGH (ref 11.5–15.5)
WBC: 7.9 10*3/uL (ref 4.0–10.5)

## 2018-03-12 LAB — BASIC METABOLIC PANEL
Anion gap: 12 (ref 5–15)
BUN: 12 mg/dL (ref 8–23)
CHLORIDE: 108 mmol/L (ref 98–111)
CO2: 20 mmol/L — ABNORMAL LOW (ref 22–32)
Calcium: 9.1 mg/dL (ref 8.9–10.3)
Creatinine, Ser: 1.95 mg/dL — ABNORMAL HIGH (ref 0.44–1.00)
GFR calc non Af Amer: 23 mL/min — ABNORMAL LOW (ref >60)
GFR, EST AFRICAN AMERICAN: 27 mL/min — AB (ref >60)
Glucose, Bld: 126 mg/dL — ABNORMAL HIGH (ref 70–99)
POTASSIUM: 3.9 mmol/L (ref 3.5–5.1)
SODIUM: 140 mmol/L (ref 135–145)

## 2018-03-12 LAB — GLUCOSE, CAPILLARY
GLUCOSE-CAPILLARY: 116 mg/dL — AB (ref 70–99)
GLUCOSE-CAPILLARY: 174 mg/dL — AB (ref 70–99)
Glucose-Capillary: 162 mg/dL — ABNORMAL HIGH (ref 70–99)
Glucose-Capillary: 86 mg/dL (ref 70–99)

## 2018-03-12 MED ORDER — CALCITONIN (SALMON) 200 UNIT/ACT NA SOLN
1.0000 | Freq: Every day | NASAL | Status: DC
  Administered 2018-03-12: 1 via NASAL
  Filled 2018-03-12: qty 3.7

## 2018-03-12 MED ORDER — CARVEDILOL 6.25 MG PO TABS
6.2500 mg | ORAL_TABLET | Freq: Two times a day (BID) | ORAL | Status: DC
  Administered 2018-03-12 – 2018-03-13 (×3): 6.25 mg via ORAL
  Filled 2018-03-12 (×3): qty 1

## 2018-03-12 MED ORDER — ACETAMINOPHEN 500 MG PO TABS
1000.0000 mg | ORAL_TABLET | Freq: Three times a day (TID) | ORAL | Status: DC
  Administered 2018-03-12 – 2018-03-13 (×4): 1000 mg via ORAL
  Filled 2018-03-12 (×4): qty 2

## 2018-03-12 NOTE — Progress Notes (Addendum)
Subjective: Patient reports some back pain but denies any leg pain or NT  Objective: Vital signs in last 24 hours: Temp:  [97.6 F (36.4 C)-98.7 F (37.1 C)] 98.7 F (37.1 C) (08/01 0449) Pulse Rate:  [68-75] 75 (08/01 0449) Resp:  [16-20] 16 (08/01 0449) BP: (111-154)/(66-72) 111/70 (08/01 0449) SpO2:  [99 %-100 %] 100 % (08/01 0449) Weight:  [56.3 kg (124 lb 1.9 oz)] 56.3 kg (124 lb 1.9 oz) (07/31 2100)  Intake/Output from previous day: 07/31 0701 - 08/01 0700 In: 420 [P.O.:300] Out: 1025 [Urine:1025] Intake/Output this shift: No intake/output data recorded.  Neurologic: Grossly normal  Lab Results: Lab Results  Component Value Date   WBC 7.9 03/12/2018   HGB 11.7 (L) 03/12/2018   HCT 36.7 03/12/2018   MCV 80.1 03/12/2018   PLT 176 03/12/2018   Lab Results  Component Value Date   INR 1.15 02/09/2018   BMET Lab Results  Component Value Date   NA 140 03/12/2018   K 3.9 03/12/2018   CL 108 03/12/2018   CO2 20 (L) 03/12/2018   GLUCOSE 126 (H) 03/12/2018   BUN 12 03/12/2018   CREATININE 1.95 (H) 03/12/2018   CALCIUM 9.1 03/12/2018    Studies/Results: Ct Thoracic Spine Wo Contrast  Result Date: 03/11/2018 CLINICAL DATA:  Severe low back pain radiating to the left lower extremity. Thoracolumbar compression fractures. EXAM: CT THORACIC AND LUMBAR SPINE WITHOUT CONTRAST TECHNIQUE: Multidetector CT imaging of the thoracic and lumbar spine was performed without contrast. Multiplanar CT image reconstructions were also generated. COMPARISON:  Thoracic and lumbar spine radiographs 03/09/2018. Lumbar spine CT 02/03/2018. Chest CTA 07/30/2012. FINDINGS: CT THORACIC SPINE FINDINGS Alignment: Mild upper thoracic levoscoliosis. Straightening of the normal kyphosis in the lower thoracic spine. Trace anterolisthesis of T10 on T11. Vertebrae: T12 superior endplate fracture with close to 50% height loss anteriorly is unchanged from the prior lumbar spine CT. A T11 superior endplate  compression fracture with 30% height loss was not included on the prior lumbar spine CT and is new from 2017 chest radiographs but not clearly acute. There is no T11 vertebral body retropulsion. No suspicious osseous lesion is identified. Paraspinal and other soft tissues: No significant paravertebral soft tissue edema or hematoma at T11. Partially visualized ICD leads. Aortic and coronary artery atherosclerosis. Disc levels: Disc degeneration most advanced at T7-8 and T8-9 where there is severe disc space narrowing, endplate sclerosis, and Schmorl's nodes. Small central disc protrusions at T8-9 larger than T7-8 without evidence of significant spinal stenosis on this non-myelographic examination. Neural foraminal stenosis at these levels is most notable at T8-9, likely moderate bilaterally. Milder disc degeneration elsewhere in the thoracic spine. CT LUMBAR SPINE FINDINGS Segmentation: Standard. Alignment: Unchanged minimal anterolisthesis of L3 on L4. Vertebrae: L1 compression fracture with increasing osseous lucency subjacent to the superior endplate with surrounding sclerosis and increased vertebral body height loss compared to the prior CT, now 20%. L2 compression fracture with unchanged mild height loss. No new lumbar spine fracture. No suspicious osseous lesion. Paraspinal and other soft tissues: Mild paravertebral edema at L1. Abdominal aortic atherosclerosis without aneurysm. Disc levels: Similar appearance of disc and facet degeneration compared 2 last month CT with most notable at findings at L3-4 where there is anterolisthesis with bulging uncovered disc and moderate facet and ligamentum flavum hypertrophy resulting in mild spinal stenosis and mild right and mild-to-moderate left neural foraminal stenosis. IMPRESSION: 1. Mild T11 compression fracture, new from 2017 though not clearly acute and therefore of indeterminate age. 2.  L1 compression fracture with mildly progressive height loss since 02/03/2018.  3. Unchanged T12 and L2 compression fractures. 4. Thoracic disc degeneration without evidence of significant spinal stenosis. Moderate neural foraminal stenosis at T8-9. 5. Lumbar disc and facet degeneration most notable at L3-4 where there is mild spinal and mild-to-moderate neural foraminal stenosis. 6.  Aortic Atherosclerosis (ICD10-I70.0). Electronically Signed   By: Sebastian Ache M.D.   On: 03/11/2018 13:51   Ct Lumbar Spine Wo Contrast  Result Date: 03/11/2018 CLINICAL DATA:  Severe low back pain radiating to the left lower extremity. Thoracolumbar compression fractures. EXAM: CT THORACIC AND LUMBAR SPINE WITHOUT CONTRAST TECHNIQUE: Multidetector CT imaging of the thoracic and lumbar spine was performed without contrast. Multiplanar CT image reconstructions were also generated. COMPARISON:  Thoracic and lumbar spine radiographs 03/09/2018. Lumbar spine CT 02/03/2018. Chest CTA 07/30/2012. FINDINGS: CT THORACIC SPINE FINDINGS Alignment: Mild upper thoracic levoscoliosis. Straightening of the normal kyphosis in the lower thoracic spine. Trace anterolisthesis of T10 on T11. Vertebrae: T12 superior endplate fracture with close to 50% height loss anteriorly is unchanged from the prior lumbar spine CT. A T11 superior endplate compression fracture with 30% height loss was not included on the prior lumbar spine CT and is new from 2017 chest radiographs but not clearly acute. There is no T11 vertebral body retropulsion. No suspicious osseous lesion is identified. Paraspinal and other soft tissues: No significant paravertebral soft tissue edema or hematoma at T11. Partially visualized ICD leads. Aortic and coronary artery atherosclerosis. Disc levels: Disc degeneration most advanced at T7-8 and T8-9 where there is severe disc space narrowing, endplate sclerosis, and Schmorl's nodes. Small central disc protrusions at T8-9 larger than T7-8 without evidence of significant spinal stenosis on this non-myelographic  examination. Neural foraminal stenosis at these levels is most notable at T8-9, likely moderate bilaterally. Milder disc degeneration elsewhere in the thoracic spine. CT LUMBAR SPINE FINDINGS Segmentation: Standard. Alignment: Unchanged minimal anterolisthesis of L3 on L4. Vertebrae: L1 compression fracture with increasing osseous lucency subjacent to the superior endplate with surrounding sclerosis and increased vertebral body height loss compared to the prior CT, now 20%. L2 compression fracture with unchanged mild height loss. No new lumbar spine fracture. No suspicious osseous lesion. Paraspinal and other soft tissues: Mild paravertebral edema at L1. Abdominal aortic atherosclerosis without aneurysm. Disc levels: Similar appearance of disc and facet degeneration compared 2 last month CT with most notable at findings at L3-4 where there is anterolisthesis with bulging uncovered disc and moderate facet and ligamentum flavum hypertrophy resulting in mild spinal stenosis and mild right and mild-to-moderate left neural foraminal stenosis. IMPRESSION: 1. Mild T11 compression fracture, new from 2017 though not clearly acute and therefore of indeterminate age. 2. L1 compression fracture with mildly progressive height loss since 02/03/2018. 3. Unchanged T12 and L2 compression fractures. 4. Thoracic disc degeneration without evidence of significant spinal stenosis. Moderate neural foraminal stenosis at T8-9. 5. Lumbar disc and facet degeneration most notable at L3-4 where there is mild spinal and mild-to-moderate neural foraminal stenosis. 6.  Aortic Atherosclerosis (ICD10-I70.0). Electronically Signed   By: Sebastian Ache M.D.   On: 03/11/2018 13:51    Assessment/Plan: Patient sustained an acute compression fx T11  With chronic compression fx of T12, L1, and L2. Reviewed CT scan thoracic spine. I do believe that she would be a good candidate for IR to perform a vertebroplasty. Can ambulate with brace on.    LOS: 3  days    Tiana Loft [facility] 03/12/2018, 7:41 AM  Agree with above 

## 2018-03-12 NOTE — Evaluation (Signed)
Physical Therapy Evaluation Patient Details Name: [name] [name] MRN: [id number] DOB: 1934/02/29 Today's Date: 03/12/2018   History of Present Illness  Pt is a 84 y.o. F with significant PMH of diabetes mellitus type 2, hypertension, hypothyroidism, chronic combined CHF who was recently admitted and discharged on February 14, 2018 after patient was admitted for acute renal failure to SNF. Recently discharged from rehab to home and presents currently with fatigue and weakness. Found to have acute compression fx T11 with chronic compression fx of T12, L1, and L2.    Clinical Impression  Pt admitted with above diagnosis. Pt currently with functional limitations due to the deficits listed below (see PT Problem List). Patient recently discharged from SNF to home where her son assists with ADL's. Patient denies falls since she has been home. Currently displaying seemingly good pain control. Ambulating in room with walker and min guard assist. Does require multimodal cueing for safety awareness with turns and positioning prior to sitting. Pt will benefit from skilled PT to increase their independence and safety with mobility to allow discharge to the venue listed below.       Follow Up Recommendations Home health PT;Supervision/Assistance - 24 hour    Equipment Recommendations  None recommended by PT    Recommendations for Other Services OT consult     Precautions / Restrictions Precautions Precautions: Fall;Back Precaution Booklet Issued: No Required Braces or Orthoses: Spinal Brace Spinal Brace: Thoracolumbosacral orthotic Restrictions Weight Bearing Restrictions: No      Mobility  Bed Mobility Overal bed mobility: Modified Independent             General bed mobility comments: increased time and effort but no physical assistance required  Transfers Overall transfer level: Needs assistance Equipment used: Rolling walker (2 wheeled) Transfers: Sit to/from Stand Sit to Stand: Min  assist;Min guard         General transfer comment: Hand over hand guidance for safe hand placement. With turning to sit down on toilet and seat, patient needs tactile cueing at hips to turn fully to square hips to surface  Ambulation/Gait Ambulation/Gait assistance: Min guard Gait Distance (Feet): 20 Feet Assistive device: Rolling walker (2 wheeled) Gait Pattern/deviations: Step-through pattern;Decreased stride length     General Gait Details: Patient with slow and steady gait, no overt LOB  Stairs            Wheelchair Mobility    Modified Rankin (Stroke Patients Only)       Balance Overall balance assessment: Mild deficits observed, not formally tested                                           Pertinent Vitals/Pain Pain Assessment: Faces Faces Pain Scale: Hurts a little bit Pain Location: back Pain Descriptors / Indicators: Guarding Pain Intervention(s): Monitored during session    Home Living Family/patient expects to be discharged to:: Private residence Living Arrangements: Children(son) Available Help at Discharge: Family;Available PRN/intermittently Type of Home: House Home Access: Stairs to enter Entrance Stairs-Rails: None Entrance Stairs-Number of Steps: 3 Home Layout: Two level Home Equipment: Walker - 2 wheels Additional Comments: Pt unreliable historian    Prior Function Level of Independence: Needs assistance   Gait / Transfers Assistance Needed: uses walker "sometimes"  ADL's / Homemaking Assistance Needed: states her son assist with bathing and dressing        Hand Dominance  Extremity/Trunk Assessment   Upper Extremity Assessment Upper Extremity Assessment: Defer to OT evaluation    Lower Extremity Assessment Lower Extremity Assessment: Generalized weakness    Cervical / Trunk Assessment Cervical / Trunk Assessment: Other exceptions Cervical / Trunk Exceptions: acute compression fx T11 with spinal  brace  Communication   Communication: No difficulties  Cognition Arousal/Alertness: Awake/alert Behavior During Therapy: Flat affect Overall Cognitive Status: No family/caregiver present to determine baseline cognitive functioning                                 General Comments: Cognition seems similar to previous admission but no caregiver present to confirm. A&Ox2 (states year is "1999") but overall following simple commands with increased time. Slightly impulsive.      General Comments      Exercises     Assessment/Plan    PT Assessment Patient needs continued PT services  PT Problem List Decreased strength;Decreased activity tolerance;Decreased balance;Decreased mobility;Decreased coordination;Decreased cognition;Decreased safety awareness;Pain       PT Treatment Interventions DME instruction;Stair training;Gait training;Functional mobility training;Therapeutic activities;Therapeutic exercise;Balance training;Patient/family education    PT Goals (Current goals can be found in the Care Plan section)  Acute Rehab PT Goals Patient Stated Goal: none stated but agreeable to participate with therapy PT Goal Formulation: With patient Time For Goal Achievement: 03/26/18 Potential to Achieve Goals: Good    Frequency Min 3X/week   Barriers to discharge        Co-evaluation               AM-PAC PT "6 Clicks" Daily Activity  Outcome Measure Difficulty turning over in bed (including adjusting bedclothes, sheets and blankets)?: A Little Difficulty moving from lying on back to sitting on the side of the bed? : A Little Difficulty sitting down on and standing up from a chair with arms (e.g., wheelchair, bedside commode, etc,.)?: A Little Help needed moving to and from a bed to chair (including a wheelchair)?: A Little Help needed walking in hospital room?: A Little Help needed climbing 3-5 steps with a railing? : A Lot 6 Click Score: 17    End of Session  Equipment Utilized During Treatment: Gait belt;Back brace Activity Tolerance: Patient tolerated treatment well Patient left: in chair;with call bell/phone within reach;with chair alarm set;Other (comment)(telesitter) Nurse Communication: Mobility status PT Visit Diagnosis: Unsteadiness on feet (R26.81);Muscle weakness (generalized) (M62.81);Difficulty in walking, not elsewhere classified (R26.2)    Time: 1610-9604 PT Time Calculation (min) (ACUTE ONLY): 23 min   Charges:   PT Evaluation $PT Eval Moderate Complexity: 1 Mod PT Treatments $Therapeutic Activity: 8-22 mins        Laurina Bustle, PT, DPT Acute Rehabilitation Services  Pager: [phone]   Vanetta Mulders 03/12/2018, 2:22 PM

## 2018-03-12 NOTE — Care Management Important Message (Signed)
Important Message  Patient Details  Name: [name] [name] MRN: [id number] Date of Birth: 1933-09-01   Medicare Important Message Given:  Yes    Hamna Asa 03/12/2018, 1:32 PM

## 2018-03-12 NOTE — Progress Notes (Signed)
Patient has had an incontinent episode of bowel and bladder post foley removal

## 2018-03-12 NOTE — Consult Note (Signed)
   [facility] CM Inpatient Consult   03/12/2018  [name] [name] [birth year] [id number]    [facility] Care Management follow up.  Went to [name] [name]'s bedside. She was resting but woke up long enough to ask writer to contact her son, [name] [name] about [facility] Care Management program services.  Telephone call to [facility] at [phone]. Explained [facility] Care Management program. He is agreeable and verbal consent obtained. [name] [name] states either he or his brother [name] [name] can be contacted for post discharge calls. Discussed that writer will attempt to follow up with him tomorrow at bedside to obtain [facility] written consent to include both sons. [name] [name] is agreeable to this plan.  [name] [name] endorses that patient lives with him.  Confirms Primary Care MD is [name] [name] at [facility] listed as doing toc). Denies having any concerns with transportation or medication.  Explained [facility] Care Management will not interfere or replace services provided by home health.   Will follow up again tomorrow at bedside when son is available for written consent. [name] [name] states he is usually at the hospital around 1030am.  Will request to be assigned to [facility] due to recent hospital readmission and multiple comorbities including DM, CKD, CHF, PAD, CVA, CAD.    Raiford Noble, MSN-Ed, RN,BSN [facility] Liaison [phone]

## 2018-03-12 NOTE — Progress Notes (Signed)
Restraints off at 7:50 am

## 2018-03-12 NOTE — Progress Notes (Addendum)
PT Cancellation Note  Patient Details Name: [name] [name] MRN: [id number] DOB: 1934-05-20   Cancelled Treatment:    Reason Eval/Treat Not Completed: Other (comment). Pt family has not arrived with spinal brace. Per Dr. Bridgette Habermann note, patient should wear brace when out of bed or ambulating. Will follow for evaluation.  Laurina Bustle, PT, DPT Acute Rehabilitation Services  Pager: [phone]    [name] [name] 03/12/2018, 8:39 AM

## 2018-03-12 NOTE — Progress Notes (Signed)
PROGRESS NOTE        PATIENT DETAILS Name: [name] [name] Age: 84 y.o. Sex: female Date of Birth: 1934-07-28 Admit Date: 03/09/2018 Admitting Physician Eduard Clos, MD [id number], Bernadene Bell, MD  Brief Narrative: Patient is a 84 y.o. female prior history of DM-2, CKD 4, hypertension, combined chronic systolic and diastolic heart failure, PAD requiring angioplasty in 2017, CVA in 2017 without any residual deficits with fatigue, found to have acute kidney injury with a creatinine of 4.8 severe low back pain likely secondary to a acute T11 compression fracture.  See below for further details  Subjective: Back pain is currently well controlled.  Assessment/Plan: Acute kidney injury on CKD stage IV: Suspect AKI was hemodynamically mediated-improving with supportive care.  UA without any proteinuria.  Continue to trend creatinine.  Intractable back pain secondary to T11 compression fracture: Appreciate neurosurgical input-we will touch base with IR to see if patient will benefit from kyphoplasty-however patient appears to be on Plavix which will probably need to be held for 5 days.  Suspect kyphoplasty could be done in the outpatient setting.  Nasal calcitonin, change to scheduled Tylenol-stop fentanyl patch-and use short-acting oxycodone sparingly.  Recent work-up including DEXA scan-this is being deferred to her PCP.  Hypothyroidism: Continue with levothyroxine  DM-2: CBG stable with SSI.  Dyslipidemia: Continue statin.  Chronic combined systolic and diastolic heart failure: Compensated TTE in 2015 showed a EF around 25-30%, but echo in 2017 showed EF of 60 to 65% with grade 1 diastolic dysfunction.  His ICD in place.  History of peripheral arterial disease/CVA/noncritical CAD: All stable-appears to be on dual antiplatelet agents.  May need to hold Plavix if patient and family decides to proceed with kyphoplasty.  DVT Prophylaxis: Prophylactic Heparin    Code Status: Full code  Family Communication: Son at bedside  Disposition Plan: Remain inpatient-but will plan on Home health-likely tomorrow  Antimicrobial agents: Anti-infectives (From admission, onward)   None      Procedures: None  CONSULTS:  Neurosurgery  Time spent: 25 minutes-Greater than 50% of this time was spent in counseling, explanation of diagnosis, planning of further management, and coordination of care.  MEDICATIONS: Scheduled Meds: . acetaminophen  1,000 mg Oral TID  . aspirin EC  81 mg Oral Daily  . calcitonin (salmon)  1 spray Alternating Nares Daily  . clopidogrel  75 mg Oral Daily  . heparin  5,000 Units Subcutaneous Q8H  . insulin aspart  0-9 Units Subcutaneous TID WC  . levothyroxine  50 mcg Oral QAC breakfast  . lidocaine  1 patch Transdermal Q24H  . polyethylene glycol  17 g Oral Daily  . QUEtiapine  25 mg Oral BID  . rosuvastatin  20 mg Oral Daily  . senna  1 tablet Oral BID   Continuous Infusions: PRN Meds:.acetaminophen **OR** acetaminophen, fentaNYL (SUBLIMAZE) injection, hydrALAZINE, ondansetron **OR** ondansetron (ZOFRAN) IV   PHYSICAL EXAM: Vital signs: Vitals:   03/11/18 1602 03/11/18 2100 03/12/18 0449 03/12/18 0931  BP: (!) 154/72 (!) 145/66 111/70 138/70  Pulse: 68 75 75 73  Resp: 17 20 16 18   Temp: 98.5 F (36.9 C) 97.6 F (36.4 C) 98.7 F (37.1 C) 98.7 F (37.1 C)  TempSrc: Oral Axillary Oral Oral  SpO2: 99% 100% 100% 97%  Weight:  56.3 kg (124 lb 1.9 oz)    Height:  Filed Weights   03/09/18 1711 03/09/18 2219 03/11/18 2100  Weight: 57.6 kg (127 lb) 57.6 kg (127 lb) 56.3 kg (124 lb 1.9 oz)   Body mass index is 21.99 kg/m.   General appearance :Awake, alert, not in any distress.  Eyes:Pink conjunctiva HEENT: Atraumatic and Normocephalic Neck: supple, no JVD. No cervical lymphadenopathy. No thyromegaly Resp:Good air entry bilaterally, no added sounds  CVS: S1 S2 regular GI: Bowel sounds present, Non  tender and not distended with no gaurding, rigidity or rebound.No organomegaly Extremities: B/L Lower Ext shows no edema, both legs are warm to touch Neurology:  speech clear,Non focal, sensation is grossly intact. Psychiatric: Normal judgment and insight. Alert and oriented x 3. Normal mood. Musculoskeletal:No digital cyanosis Skin:No Rash, warm and dry Wounds:N/A  I have personally reviewed following labs and imaging studies  LABORATORY DATA: CBC: Recent Labs  Lab 03/09/18 1128 03/09/18 1722 03/10/18 0607 03/11/18 0714 03/12/18 0404  WBC 5.6 6.7 6.0 7.3 7.9  NEUTROABS 2.3  --  2.6  --   --   HGB 12.3 12.5 12.4 12.6 11.7*  HCT 38.0 40.6 40.5 40.2 36.7  MCV 81.2 83.7 82.8 81.5 80.1  PLT 181.0 182 188 185 176    Basic Metabolic Panel: Recent Labs  Lab 03/09/18 1128 03/09/18 1722 03/10/18 0607 03/11/18 0714 03/12/18 0404  NA 139 135 141 141 140  K 4.6 4.6 5.3* 3.3* 3.9  CL 102 103 109 107 108  CO2 21 19* 20* 20* 20*  GLUCOSE 171* 216* 221* 99 126*  BUN 48* 43* 35* 15 12  CREATININE 4.84 Repeated and verified X2.* 4.58* 3.22* 2.05* 1.95*  CALCIUM 9.4 9.0 9.0 9.1 9.1    GFR: Estimated Creatinine Clearance: 18.7 mL/min (A) (by C-G formula based on SCr of 1.95 mg/dL (H)).  Liver Function Tests: Recent Labs  Lab 03/10/18 0607  AST 25  ALT 20  ALKPHOS 108  BILITOT 0.7  PROT 7.3  ALBUMIN 3.2*   No results for input(s): LIPASE, AMYLASE in the last 168 hours. No results for input(s): AMMONIA in the last 168 hours.  Coagulation Profile: No results for input(s): INR, PROTIME in the last 168 hours.  Cardiac Enzymes: No results for input(s): CKTOTAL, CKMB, CKMBINDEX, TROPONINI in the last 168 hours.  BNP (last 3 results) No results for input(s): PROBNP in the last 8760 hours.  HbA1C: No results for input(s): HGBA1C in the last 72 hours.  CBG: Recent Labs  Lab 03/11/18 1152 03/11/18 1649 03/11/18 2108 03/12/18 0738 03/12/18 1143  GLUCAP 161* 102* 98  116* 174*    Lipid Profile: No results for input(s): CHOL, HDL, LDLCALC, TRIG, CHOLHDL, LDLDIRECT in the last 72 hours.  Thyroid Function Tests: No results for input(s): TSH, T4TOTAL, FREET4, T3FREE, THYROIDAB in the last 72 hours.  Anemia Panel: No results for input(s): VITAMINB12, FOLATE, FERRITIN, TIBC, IRON, RETICCTPCT in the last 72 hours.  Urine analysis:    Component Value Date/Time   COLORURINE YELLOW 03/09/2018 1743   APPEARANCEUR CLEAR 03/09/2018 1743   LABSPEC 1.012 03/09/2018 1743   PHURINE 5.0 03/09/2018 1743   GLUCOSEU NEGATIVE 03/09/2018 1743   GLUCOSEU NEGATIVE 01/10/2016 1416   HGBUR SMALL (A) 03/09/2018 1743   BILIRUBINUR NEGATIVE 03/09/2018 1743   KETONESUR NEGATIVE 03/09/2018 1743   PROTEINUR 30 (A) 03/09/2018 1743   UROBILINOGEN 0.2 01/10/2016 1416   NITRITE NEGATIVE 03/09/2018 1743   LEUKOCYTESUR NEGATIVE 03/09/2018 1743    Sepsis Labs: Lactic Acid, Venous    Component Value Date/Time  LATICACIDVEN 0.98 02/09/2018 1556    MICROBIOLOGY: No results found for this or any previous visit (from the past 240 hour(s)).  RADIOLOGY STUDIES/RESULTS: Dg Thoracic Spine 2 View  Result Date: 03/09/2018 CLINICAL DATA:  Fall 2 days ago with back pain. EXAM: THORACIC SPINE 2 VIEWS COMPARISON:  Included portion from lumbar spine CT 02/03/2018. Chest radiograph 01/10/2016 FINDINGS: Chronic and unchanged T12 and upper lumbar compression fractures compared with recent lumbar spine CT. T11 compression fracture with approximately 30% loss of height anteriorly was not included in the field of view on prior exam and is age indeterminate, but appears new from 2017 chest radiograph. Mild scoliotic curvature of the upper thoracic spine. IMPRESSION: 1. Age indeterminate T11 compression fracture with approximately 30% loss of height anteriorly. This is new from 2017 chest radiograph. 2. Unchanged compression fractures of T12 and the upper lumbar spine since CT last month.  Electronically Signed   By: Rubye Oaks M.D.   On: 03/09/2018 23:53   Dg Lumbar Spine 2-3 Views  Result Date: 03/09/2018 CLINICAL DATA:  Fall 2 days ago with back pain. EXAM: LUMBAR SPINE - 2-3 VIEW COMPARISON:  Lumbar spine CT 02/03/2018 FINDINGS: Unchanged compression fractures of T12, L1, and L2 from prior exam. No significant progression. Compression fracture of T11 with approximately 30% loss of height was not previously included in the field of view and is age indeterminate, not definitely seen on chest radiograph of 01/10/2016. L3, L4, and L5 vertebral body heights are preserved. Similar anterolisthesis of L3 on L4. Posterior elements appear intact. Sacroiliac joints are congruent. Chronic angulation of the sacrococcygeal junction based on scout from prior lumbar spine CT. Advanced vascular calcifications in the abdomen. IMPRESSION: 1. Chronic and unchanged compression fractures of T12, L1, and L2 compared to lumbar spine CT last month. 2. T11 compression fracture with approximately 30% loss of height anteriorly was not previously included in the field of view and is age indeterminate. Electronically Signed   By: Rubye Oaks M.D.   On: 03/09/2018 23:49   Dg Pelvis 1-2 Views  Result Date: 03/09/2018 CLINICAL DATA:  Fall 2 days ago with back pain. EXAM: PELVIS - 1-2 VIEW COMPARISON:  None. FINDINGS: The cortical margins of the bony pelvis are intact. No fracture. Pubic symphysis and sacroiliac joints are congruent. Both femoral heads are well-seated in the respective acetabula. The bones are under mineralized. IMPRESSION: No visualized pelvic fracture. Electronically Signed   By: Rubye Oaks M.D.   On: 03/09/2018 23:50   Ct Thoracic Spine Wo Contrast  Result Date: 03/11/2018 CLINICAL DATA:  Severe low back pain radiating to the left lower extremity. Thoracolumbar compression fractures. EXAM: CT THORACIC AND LUMBAR SPINE WITHOUT CONTRAST TECHNIQUE: Multidetector CT imaging of the  thoracic and lumbar spine was performed without contrast. Multiplanar CT image reconstructions were also generated. COMPARISON:  Thoracic and lumbar spine radiographs 03/09/2018. Lumbar spine CT 02/03/2018. Chest CTA 07/30/2012. FINDINGS: CT THORACIC SPINE FINDINGS Alignment: Mild upper thoracic levoscoliosis. Straightening of the normal kyphosis in the lower thoracic spine. Trace anterolisthesis of T10 on T11. Vertebrae: T12 superior endplate fracture with close to 50% height loss anteriorly is unchanged from the prior lumbar spine CT. A T11 superior endplate compression fracture with 30% height loss was not included on the prior lumbar spine CT and is new from 2017 chest radiographs but not clearly acute. There is no T11 vertebral body retropulsion. No suspicious osseous lesion is identified. Paraspinal and other soft tissues: No significant paravertebral soft tissue edema or  hematoma at T11. Partially visualized ICD leads. Aortic and coronary artery atherosclerosis. Disc levels: Disc degeneration most advanced at T7-8 and T8-9 where there is severe disc space narrowing, endplate sclerosis, and Schmorl's nodes. Small central disc protrusions at T8-9 larger than T7-8 without evidence of significant spinal stenosis on this non-myelographic examination. Neural foraminal stenosis at these levels is most notable at T8-9, likely moderate bilaterally. Milder disc degeneration elsewhere in the thoracic spine. CT LUMBAR SPINE FINDINGS Segmentation: Standard. Alignment: Unchanged minimal anterolisthesis of L3 on L4. Vertebrae: L1 compression fracture with increasing osseous lucency subjacent to the superior endplate with surrounding sclerosis and increased vertebral body height loss compared to the prior CT, now 20%. L2 compression fracture with unchanged mild height loss. No new lumbar spine fracture. No suspicious osseous lesion. Paraspinal and other soft tissues: Mild paravertebral edema at L1. Abdominal aortic  atherosclerosis without aneurysm. Disc levels: Similar appearance of disc and facet degeneration compared 2 last month CT with most notable at findings at L3-4 where there is anterolisthesis with bulging uncovered disc and moderate facet and ligamentum flavum hypertrophy resulting in mild spinal stenosis and mild right and mild-to-moderate left neural foraminal stenosis. IMPRESSION: 1. Mild T11 compression fracture, new from 2017 though not clearly acute and therefore of indeterminate age. 2. L1 compression fracture with mildly progressive height loss since 02/03/2018. 3. Unchanged T12 and L2 compression fractures. 4. Thoracic disc degeneration without evidence of significant spinal stenosis. Moderate neural foraminal stenosis at T8-9. 5. Lumbar disc and facet degeneration most notable at L3-4 where there is mild spinal and mild-to-moderate neural foraminal stenosis. 6.  Aortic Atherosclerosis (ICD10-I70.0). Electronically Signed   By: Sebastian Ache M.D.   On: 03/11/2018 13:51   Ct Lumbar Spine Wo Contrast  Result Date: 03/11/2018 CLINICAL DATA:  Severe low back pain radiating to the left lower extremity. Thoracolumbar compression fractures. EXAM: CT THORACIC AND LUMBAR SPINE WITHOUT CONTRAST TECHNIQUE: Multidetector CT imaging of the thoracic and lumbar spine was performed without contrast. Multiplanar CT image reconstructions were also generated. COMPARISON:  Thoracic and lumbar spine radiographs 03/09/2018. Lumbar spine CT 02/03/2018. Chest CTA 07/30/2012. FINDINGS: CT THORACIC SPINE FINDINGS Alignment: Mild upper thoracic levoscoliosis. Straightening of the normal kyphosis in the lower thoracic spine. Trace anterolisthesis of T10 on T11. Vertebrae: T12 superior endplate fracture with close to 50% height loss anteriorly is unchanged from the prior lumbar spine CT. A T11 superior endplate compression fracture with 30% height loss was not included on the prior lumbar spine CT and is new from 2017 chest  radiographs but not clearly acute. There is no T11 vertebral body retropulsion. No suspicious osseous lesion is identified. Paraspinal and other soft tissues: No significant paravertebral soft tissue edema or hematoma at T11. Partially visualized ICD leads. Aortic and coronary artery atherosclerosis. Disc levels: Disc degeneration most advanced at T7-8 and T8-9 where there is severe disc space narrowing, endplate sclerosis, and Schmorl's nodes. Small central disc protrusions at T8-9 larger than T7-8 without evidence of significant spinal stenosis on this non-myelographic examination. Neural foraminal stenosis at these levels is most notable at T8-9, likely moderate bilaterally. Milder disc degeneration elsewhere in the thoracic spine. CT LUMBAR SPINE FINDINGS Segmentation: Standard. Alignment: Unchanged minimal anterolisthesis of L3 on L4. Vertebrae: L1 compression fracture with increasing osseous lucency subjacent to the superior endplate with surrounding sclerosis and increased vertebral body height loss compared to the prior CT, now 20%. L2 compression fracture with unchanged mild height loss. No new lumbar spine fracture. No suspicious osseous lesion.  Paraspinal and other soft tissues: Mild paravertebral edema at L1. Abdominal aortic atherosclerosis without aneurysm. Disc levels: Similar appearance of disc and facet degeneration compared 2 last month CT with most notable at findings at L3-4 where there is anterolisthesis with bulging uncovered disc and moderate facet and ligamentum flavum hypertrophy resulting in mild spinal stenosis and mild right and mild-to-moderate left neural foraminal stenosis. IMPRESSION: 1. Mild T11 compression fracture, new from 2017 though not clearly acute and therefore of indeterminate age. 2. L1 compression fracture with mildly progressive height loss since 02/03/2018. 3. Unchanged T12 and L2 compression fractures. 4. Thoracic disc degeneration without evidence of significant spinal  stenosis. Moderate neural foraminal stenosis at T8-9. 5. Lumbar disc and facet degeneration most notable at L3-4 where there is mild spinal and mild-to-moderate neural foraminal stenosis. 6.  Aortic Atherosclerosis (ICD10-I70.0). Electronically Signed   By: Sebastian Ache M.D.   On: 03/11/2018 13:51     LOS: 3 days   Jeoffrey Massed, MD  [facility]  If 7PM-7AM, please contact night-coverage  Please page via [email or web address] [email or web address]-click on MD name and type text message  03/12/2018, 2:27 PM

## 2018-03-13 ENCOUNTER — Other Ambulatory Visit: Payer: Self-pay | Admitting: *Deleted

## 2018-03-13 DIAGNOSIS — Z794 Long term (current) use of insulin: Secondary | ICD-10-CM

## 2018-03-13 DIAGNOSIS — E1140 Type 2 diabetes mellitus with diabetic neuropathy, unspecified: Secondary | ICD-10-CM

## 2018-03-13 LAB — GLUCOSE, CAPILLARY
GLUCOSE-CAPILLARY: 115 mg/dL — AB (ref 70–99)
Glucose-Capillary: 158 mg/dL — ABNORMAL HIGH (ref 70–99)

## 2018-03-13 MED ORDER — OXYCODONE HCL 5 MG PO TABA: 5.0000 mg | ORAL_TABLET | Freq: Three times a day (TID) | ORAL | 0 refills | Status: AC | PRN

## 2018-03-13 MED ORDER — CALCITONIN (SALMON) 200 UNIT/ACT NA SOLN: 1.0000 | Freq: Every day | NASAL | 0 refills | Status: AC

## 2018-03-13 MED ORDER — LIDOCAINE 5 % EX PTCH: 1.0000 | MEDICATED_PATCH | CUTANEOUS | 0 refills | Status: AC

## 2018-03-13 MED ORDER — ACETAMINOPHEN 500 MG PO TABS: 1000.0000 mg | ORAL_TABLET | Freq: Three times a day (TID) | ORAL | 0 refills | Status: AC | PRN

## 2018-03-13 NOTE — Evaluation (Signed)
Occupational Therapy Evaluation Patient Details Name: [name] [name] MRN: [id number] DOB: 1934/05/21 Today's Date: 03/13/2018    History of Present Illness Pt is a 84 y.o. F with significant PMH of diabetes mellitus type 2, hypertension, hypothyroidism, chronic combined CHF who was recently admitted and discharged on February 14, 2018 after patient was admitted for acute renal failure to SNF. Recently discharged from rehab to home and presents currently with fatigue and weakness. Found to have acute compression fx T11 with chronic compression fx of T12, L1, and L2.     Clinical Impression   PTA pt at home with family using AD fr mobility and required assist from family with ADLs. Pt with back TLSO back brace and reviewed donning/doffing with pt, transfers to toilet/BSC and UB ADLs. Pt will continue to require assist with LB at home. All education completed and no further acute OT indicated at this time. Any further OT intervention deferred to [facility]    Follow Up Recommendations  Home health OT;Supervision/Assistance - 24 hour    Equipment Recommendations  None recommended by OT    Recommendations for Other Services       Precautions / Restrictions Precautions Precautions: Fall;Back Precaution Booklet Issued: No Required Braces or Orthoses: Spinal Brace Spinal Brace: Thoracolumbosacral orthotic Restrictions Weight Bearing Restrictions: No      Mobility Bed Mobility Overal bed mobility: Modified Independent             General bed mobility comments: increased time and effort but no physical assistance required. cues to use bed rail for assist with rolling  Transfers Overall transfer level: Needs assistance Equipment used: Rolling walker (2 wheeled) Transfers: Sit to/from Stand Sit to Stand: Min assist;Min guard         General transfer comment: multimodal cues for safety, correct hand placement    Balance Overall balance assessment: Needs assistance Sitting-balance support:  Feet supported;Bilateral upper extremity supported Sitting balance-Leahy Scale: Good     Standing balance support: Bilateral upper extremity supported Standing balance-Leahy Scale: Fair Standing balance comment: Able to maintain static standing at sink with no UE support but needs external support for dynamic balance                            ADL either performed or assessed with clinical judgement   ADL Overall ADL's : At baseline;Needs assistance/impaired                                       General ADL Comments: assist with ADLs at [facility]     Vision Baseline Vision/History: Wears glasses Wears Glasses: Reading only Patient Visual Report: No change from baseline       Perception     Praxis      Pertinent Vitals/Pain Pain Assessment: Faces Faces Pain Scale: Hurts a little bit Pain Location: back Pain Descriptors / Indicators: Guarding;Grimacing Pain Intervention(s): Monitored during session;Limited activity within patient's tolerance;Repositioned     Hand Dominance Right   Extremity/Trunk Assessment Upper Extremity Assessment Upper Extremity Assessment: Generalized weakness   Lower Extremity Assessment Lower Extremity Assessment: Defer to PT evaluation   Cervical / Trunk Assessment Cervical / Trunk Assessment: Other exceptions Cervical / Trunk Exceptions: acute compression fx T11 with spinal brace   Communication Communication Communication: No difficulties   Cognition Arousal/Alertness: Awake/alert Behavior During Therapy: Flat affect Overall Cognitive Status: Impaired/Different from baseline  Area of Impairment: Orientation;Memory;Following commands;Safety/judgement;Awareness;Problem solving                 Orientation Level: Disoriented to;Time   Memory: Decreased recall of precautions;Decreased short-term memory Following Commands: Follows one step commands with increased time Safety/Judgement: Decreased awareness of  safety;Decreased awareness of deficits Awareness: Emergent Problem Solving: Requires verbal cues;Requires tactile cues;Slow processing;Decreased initiation General Comments: multimodal cues for safety using RW, correct hand placement   General Comments       Exercises     Shoulder Instructions      Home Living Family/patient expects to be discharged to:: Private residence Living Arrangements: Children Available Help at Discharge: Family;Available PRN/intermittently Type of Home: House Home Access: Stairs to enter [employer] of Steps: 3 Entrance Stairs-Rails: None Home Layout: Two level Alternate Level Stairs-Number of Steps: 12 Alternate Level Stairs-Rails: Can reach both Bathroom Shower/Tub: Chief Strategy Officer: Standard     Home Equipment: Environmental consultant - 2 wheels   Additional Comments: Pt unreliable historian      Prior Functioning/Environment Level of Independence: Needs assistance  Gait / Transfers Assistance Needed: uses walker "sometimes" ADL's / Homemaking Assistance Needed: states her son assist with bathing and dressing            OT Problem List: Decreased strength;Decreased activity tolerance;Decreased cognition;Decreased knowledge of use of DME or AE;Decreased coordination;Impaired balance (sitting and/or standing)      OT Treatment/Interventions:      OT Goals(Current goals can be found in the care plan section) Acute Rehab OT Goals Patient Stated Goal: none stated but agreeable to participate with therapy OT Goal Formulation: With patient  OT Frequency:     Barriers to D/C:    pt will d/c home with [facility] and family assist       Co-evaluation              AM-PAC PT "6 Clicks" Daily Activity     Outcome Measure Help from another person eating meals?: None Help from another person taking care of personal grooming?: A Little Help from another person toileting, which includes using toliet, bedpan, or urinal?: A  Little Help from another person bathing (including washing, rinsing, drying)?: A Lot Help from another person to put on and taking off regular upper body clothing?: A Little Help from another person to put on and taking off regular lower body clothing?: A Lot 6 Click Score: 17   End of Session Equipment Utilized During Treatment: Gait belt;Rolling walker;Other (comment);Back brace(BSC)  Activity Tolerance: Patient limited by fatigue Patient left: in bed;with bed alarm set;with call bell/phone within reach  OT Visit Diagnosis: Unsteadiness on feet (R26.81);Other abnormalities of gait and mobility (R26.89);Muscle weakness (generalized) (M62.81)                Time: 0960-4540 OT Time Calculation (min): 24 min Charges:  OT General Charges $OT Visit: 1 Visit OT Evaluation $OT Eval Moderate Complexity: 1 Mod    Galen Manila 03/13/2018, 12:17 PM

## 2018-03-13 NOTE — Care Management Note (Signed)
Case Management Note  Patient Details  Name: [name] [name] MRN: [id number] Date of Birth: 02-04-1934  Subjective/Objective:                    Action/Plan: Spoke to patient's son [name] via phone [phone], regarding discharge. He would like to continue home health services through [facility]. Expected Discharge Date:  03/13/18               Expected Discharge Plan:  Home w Home Health Services  In-House Referral:     Discharge planning Services  CM Consult  Post Acute Care Choice:    Choice offered to:  Patient, Adult Children  DME Arranged:  N/A DME Agency:  NA  HH Arranged:  PT, RN, Nurse's Aide HH Agency:  [facility]  Status of Service:  Completed, signed off  If discussed at Long Length of Stay Meetings, dates discussed:    Additional Comments:  Kingsley Plan, RN 03/13/2018, 10:21 AM

## 2018-03-13 NOTE — Patient Outreach (Signed)
[facility]) Care Management  03/13/2018  Lunarae Zengel 07-Jan-1934 [id number]  Care coordination  Per chart review-patient was re-admitted from ED to inpatient admission 03/09/2018. Diagnosis-Acute renal failure, severe low back pain, HTN. Patient with symptoms of generalized weakness, poor po intake. Recent discharge from [facility] 7/23 following hospital stay 7/1-02/14/2018  Plan: [facility] Liaison of readmission. Will advise Primary Care Coordinator.  Covering for Aleen Campi, RN BSN CCM Care Management Coordinator [facility] Care Management  [phone]

## 2018-03-13 NOTE — Discharge Planning (Signed)
PATIENT DETAILS Name: [name] [name] Age: 84 y.o. Sex: female Date of Birth: June 26, 1934 MRN: [id number]. Admitting Physician: Eduard Clos, MD [id number], Bernadene Bell, MD  Admit Date: 03/09/2018 Discharge date: 03/13/2018  Recommendations for Outpatient Follow-up:  1. Follow up with PCP in 1-2 weeks 2. Please obtain BMP/CBC in one week 3. Please ensure follow-up with interventional radiology (they will be calling the patient with a follow-up appointment) 4. Will need to hold Plavix for 5 days before kyphoplasty  Admitted From:  Home  Disposition: Home with home health services    Home Health:  Yes  Equipment/Devices: None  Discharge Condition: Stable  CODE STATUS: FULL CODE  Diet recommendation:  Heart Healthy / Carb Modified  Brief Summary: See H&P, Labs, Consult and Test reports for all details in brief,Patient is a 84 y.o. female prior history of DM-2, CKD 4, hypertension, combined chronic systolic and diastolic heart failure, PAD requiring angioplasty in 2017, CVA in 2017 without any residual deficits with fatigue, found to have acute kidney injury with a creatinine of 4.8 severe low back pain likely secondary to a acute T11 compression fracture.  See below for further details  Brief Hospital Course: Acute kidney injury on CKD stage IV: Suspect AKI was hemodynamically mediated-improving with supportive care.  UA without any proteinuria.  Continue to trend creatinine closely in the outpatient setting.  Intractable back pain secondary to T11 compression fracture:  Pain adequately controlled with scheduled Tylenol, nasal calcitonin spray and as needed narcotics.  Patient ambulated with physical therapy with a TSLO brace.  This morning she seems very comfortable and pain is only minimal.  Evaluated by neurosurgery-subsequent recommendations to see if patient is a candidate for IR kyphoplasty.  Spoke with Dr. Terald Sleeper yesterday-patient will need Plavix to be held for 5  days before a kyphoplasty can be done.  For now will continue Plavix-have asked scheduler for IR to see if we can get her an outpatient appointment with Dr. Terald Sleeper.  Once that happens-and a date for kyphoplasty is set, Plavix can be held for 5 days.  Spoke with patient's son over the phone-he is agreeable with this plan.  I have asked him to use narcotics very sparingly-only if her pain is intractable with Tylenol. Work-up including DEXA scan-will be deferred to her PCP.  Hypothyroidism: Continue with levothyroxine  DM-2: CBG stable with SSI.  Dyslipidemia: Continue statin.  Chronic combined systolic and diastolic heart failure: Compensated TTE in 2015 showed a EF around 25-30%, but echo in 2017 showed EF of 60 to 65% with grade 1 diastolic dysfunction.  Has ICD in place.  History of peripheral arterial disease/CVA/noncritical CAD: All stable-appears to be on dual antiplatelet agents.    See above regarding need to hold Plavix 5 days prior to kyphoplasty.  Procedures/Studies: None  Discharge Diagnoses:  Principal Problem:   ARF (acute renal failure) (HCC) Active Problems:   Hypothyroidism   Congestive dilated cardiomyopathy (HCC)   HTN (hypertension)   Chronic systolic heart failure (HCC)   Left bundle branch block   Type 2 diabetes mellitus with diabetic neuropathy, with long-term current use of insulin (HCC)   History of stroke   Discharge Instructions:  Activity:  As tolerated with Full fall precautions use walker/cane & assistance as needed  Discharge Instructions    Diet - low sodium heart healthy   Complete by:  As directed    Diet Carb Modified   Complete by:  As directed    Discharge instructions  Complete by:  As directed    Follow with Primary MD  [name], MD in 1 week  Follow with Interventional Radiology-Dr Deveshwor in 1-2 weeks for possible Kyphoplasty  Please get a complete blood count and chemistry panel checked by your Primary MD at your  next visit, and again as instructed by your Primary MD.  Get Medicines reviewed and adjusted: Please take all your medications with you for your next visit with your Primary MD  Laboratory/radiological data: Please request your Primary MD to go over all hospital tests and procedure/radiological results at the follow up, please ask your Primary MD to get all Hospital records sent to his/her office.  In some cases, they will be blood work, cultures and biopsy results pending at the time of your discharge. Please request that your primary care M.D. follows up on these results.  Also Note the following: If you experience worsening of your admission symptoms, develop shortness of breath, life threatening emergency, suicidal or homicidal thoughts you must seek medical attention immediately by calling 911 or calling your MD immediately  if symptoms less severe.  You must read complete instructions/literature along with all the possible adverse reactions/side effects for all the Medicines you take and that have been prescribed to you. Take any new Medicines after you have completely understood and accpet all the possible adverse reactions/side effects.   Do not drive when taking Pain medications or sleeping medications (Benzodaizepines)  Do not take more than prescribed Pain, Sleep and Anxiety Medications. It is not advisable to combine anxiety,sleep and pain medications without talking with your primary care practitioner  Special Instructions: If you have smoked or chewed Tobacco  in the last 2 yrs please stop smoking, stop any regular Alcohol  and or any Recreational drug use.  Wear Seat belts while driving.  Please note: You were cared for by a hospitalist during your hospital stay. Once you are discharged, your primary care physician will handle any further medical issues. Please note that NO REFILLS for any discharge medications will be authorized once you are discharged, as it is imperative  that you return to your primary care physician (or establish a relationship with a primary care physician if you do not have one) for your post hospital discharge needs so that they can reassess your need for medications and monitor your lab values.   Increase activity slowly   Complete by:  As directed      Allergies as of 03/13/2018      Reactions   Statins Other (See Comments)   Leg cramping   Metformin And Related    Pt stopped it due to diarrhea   Vytorin [ezetimibe-simvastatin] Other (See Comments)   Leg cramps      Medication List    TAKE these medications   acetaminophen 500 MG tablet Commonly known as:  TYLENOL Take 2 tablets (1,000 mg total) by mouth every 8 (eight) hours as needed. What changed:    medication strength  how much to take  when to take this   aspirin EC 81 MG tablet Take 81 mg by mouth daily.   calcitonin (salmon) 200 UNIT/ACT nasal spray Commonly known as:  MIACALCIN/FORTICAL Place 1 spray into alternate nostrils daily.   carvedilol 12.5 MG tablet Commonly known as:  COREG Take 6.25 mg by mouth 2 (two) times daily.   clopidogrel 75 MG tablet Commonly known as:  PLAVIX TAKE 1 TABLET BY MOUTH ONCE DAILY   FREESTYLE LITE test strip Generic  drug:  glucose blood USE 1 STRIP TO CHECK GLUCOSE TWICE DAILY   insulin aspart 100 UNIT/ML injection Commonly known as:  novoLOG Inject 0-9 Units into the skin 3 (three) times daily with meals.   levothyroxine 50 MCG tablet Commonly known as:  SYNTHROID, LEVOTHROID TAKE 1 TABLET BY MOUTH ONCE DAILY   lidocaine 5 % Commonly known as:  LIDODERM Place 1 patch onto the skin daily. Remove & Discard patch within 12 hours or as directed by MD   magnesium oxide 400 (241.3 Mg) MG tablet Commonly known as:  MAG-OX Take 1 tablet (400 mg total) by mouth daily.   multivitamin with minerals Tabs tablet Take 1 tablet by mouth daily.   OxyCODONE HCl (Abuse Deter) 5 MG Taba Commonly known as:  OXAYDO Take 5  mg by mouth every 8 (eight) hours as needed. What changed:  when to take this   rosuvastatin 20 MG tablet Commonly known as:  CRESTOR TAKE 1 TABLET BY MOUTH ONCE DAILY What changed:    how much to take  how to take this  when to take this   Vitamin D3 5000 units Caps Take by mouth. Take 1 capsule by mouth every 7 days      Follow-up Information    [name], MD.   Specialty:  Internal Medicine Contact information: [address] [place] [address] [phone]        Julieanne Cotton, MD Follow up.   Specialties:  Interventional Radiology, Radiology Why:  office will call you with a appointment    [phone] Contact information: [address] [place] [address] [phone]          Allergies  Allergen Reactions  . Statins Other (See Comments)    Leg cramping  . Metformin And Related     Pt stopped it due to diarrhea  . Vytorin [Ezetimibe-Simvastatin] Other (See Comments)    Leg cramps    Consultations:   Neurosurgery  Other Procedures/Studies: Dg Thoracic Spine 2 View  Result Date: 03/09/2018 CLINICAL DATA:  Fall 2 days ago with back pain. EXAM: THORACIC SPINE 2 VIEWS COMPARISON:  Included portion from lumbar spine CT 02/03/2018. Chest radiograph 01/10/2016 FINDINGS: Chronic and unchanged T12 and upper lumbar compression fractures compared with recent lumbar spine CT. T11 compression fracture with approximately 30% loss of height anteriorly was not included in the field of view on prior exam and is age indeterminate, but appears new from 2017 chest radiograph. Mild scoliotic curvature of the upper thoracic spine. IMPRESSION: 1. Age indeterminate T11 compression fracture with approximately 30% loss of height anteriorly. This is new from 2017 chest radiograph. 2. Unchanged compression fractures of T12 and the upper lumbar spine since CT last month. Electronically Signed   By: Rubye Oaks M.D.   On: 03/09/2018 23:53    Dg Lumbar Spine 2-3 Views  Result Date: 03/09/2018 CLINICAL DATA:  Fall 2 days ago with back pain. EXAM: LUMBAR SPINE - 2-3 VIEW COMPARISON:  Lumbar spine CT 02/03/2018 FINDINGS: Unchanged compression fractures of T12, L1, and L2 from prior exam. No significant progression. Compression fracture of T11 with approximately 30% loss of height was not previously included in the field of view and is age indeterminate, not definitely seen on chest radiograph of 01/10/2016. L3, L4, and L5 vertebral body heights are preserved. Similar anterolisthesis of L3 on L4. Posterior elements appear intact. Sacroiliac joints are congruent. Chronic angulation of the sacrococcygeal junction based on scout from prior  lumbar spine CT. Advanced vascular calcifications in the abdomen. IMPRESSION: 1. Chronic and unchanged compression fractures of T12, L1, and L2 compared to lumbar spine CT last month. 2. T11 compression fracture with approximately 30% loss of height anteriorly was not previously included in the field of view and is age indeterminate. Electronically Signed   By: Rubye Oaks M.D.   On: 03/09/2018 23:49   Dg Pelvis 1-2 Views  Result Date: 03/09/2018 CLINICAL DATA:  Fall 2 days ago with back pain. EXAM: PELVIS - 1-2 VIEW COMPARISON:  None. FINDINGS: The cortical margins of the bony pelvis are intact. No fracture. Pubic symphysis and sacroiliac joints are congruent. Both femoral heads are well-seated in the respective acetabula. The bones are under mineralized. IMPRESSION: No visualized pelvic fracture. Electronically Signed   By: Rubye Oaks M.D.   On: 03/09/2018 23:50   Ct Thoracic Spine Wo Contrast  Result Date: 03/11/2018 CLINICAL DATA:  Severe low back pain radiating to the left lower extremity. Thoracolumbar compression fractures. EXAM: CT THORACIC AND LUMBAR SPINE WITHOUT CONTRAST TECHNIQUE: Multidetector CT imaging of the thoracic and lumbar spine was performed without contrast. Multiplanar CT image  reconstructions were also generated. COMPARISON:  Thoracic and lumbar spine radiographs 03/09/2018. Lumbar spine CT 02/03/2018. Chest CTA 07/30/2012. FINDINGS: CT THORACIC SPINE FINDINGS Alignment: Mild upper thoracic levoscoliosis. Straightening of the normal kyphosis in the lower thoracic spine. Trace anterolisthesis of T10 on T11. Vertebrae: T12 superior endplate fracture with close to 50% height loss anteriorly is unchanged from the prior lumbar spine CT. A T11 superior endplate compression fracture with 30% height loss was not included on the prior lumbar spine CT and is new from 2017 chest radiographs but not clearly acute. There is no T11 vertebral body retropulsion. No suspicious osseous lesion is identified. Paraspinal and other soft tissues: No significant paravertebral soft tissue edema or hematoma at T11. Partially visualized ICD leads. Aortic and coronary artery atherosclerosis. Disc levels: Disc degeneration most advanced at T7-8 and T8-9 where there is severe disc space narrowing, endplate sclerosis, and Schmorl's nodes. Small central disc protrusions at T8-9 larger than T7-8 without evidence of significant spinal stenosis on this non-myelographic examination. Neural foraminal stenosis at these levels is most notable at T8-9, likely moderate bilaterally. Milder disc degeneration elsewhere in the thoracic spine. CT LUMBAR SPINE FINDINGS Segmentation: Standard. Alignment: Unchanged minimal anterolisthesis of L3 on L4. Vertebrae: L1 compression fracture with increasing osseous lucency subjacent to the superior endplate with surrounding sclerosis and increased vertebral body height loss compared to the prior CT, now 20%. L2 compression fracture with unchanged mild height loss. No new lumbar spine fracture. No suspicious osseous lesion. Paraspinal and other soft tissues: Mild paravertebral edema at L1. Abdominal aortic atherosclerosis without aneurysm. Disc levels: Similar appearance of disc and facet  degeneration compared 2 last month CT with most notable at findings at L3-4 where there is anterolisthesis with bulging uncovered disc and moderate facet and ligamentum flavum hypertrophy resulting in mild spinal stenosis and mild right and mild-to-moderate left neural foraminal stenosis. IMPRESSION: 1. Mild T11 compression fracture, new from 2017 though not clearly acute and therefore of indeterminate age. 2. L1 compression fracture with mildly progressive height loss since 02/03/2018. 3. Unchanged T12 and L2 compression fractures. 4. Thoracic disc degeneration without evidence of significant spinal stenosis. Moderate neural foraminal stenosis at T8-9. 5. Lumbar disc and facet degeneration most notable at L3-4 where there is mild spinal and mild-to-moderate neural foraminal stenosis. 6.  Aortic Atherosclerosis (ICD10-I70.0). Electronically Signed  By: Sebastian Ache M.D.   On: 03/11/2018 13:51   Ct Lumbar Spine Wo Contrast  Result Date: 03/11/2018 CLINICAL DATA:  Severe low back pain radiating to the left lower extremity. Thoracolumbar compression fractures. EXAM: CT THORACIC AND LUMBAR SPINE WITHOUT CONTRAST TECHNIQUE: Multidetector CT imaging of the thoracic and lumbar spine was performed without contrast. Multiplanar CT image reconstructions were also generated. COMPARISON:  Thoracic and lumbar spine radiographs 03/09/2018. Lumbar spine CT 02/03/2018. Chest CTA 07/30/2012. FINDINGS: CT THORACIC SPINE FINDINGS Alignment: Mild upper thoracic levoscoliosis. Straightening of the normal kyphosis in the lower thoracic spine. Trace anterolisthesis of T10 on T11. Vertebrae: T12 superior endplate fracture with close to 50% height loss anteriorly is unchanged from the prior lumbar spine CT. A T11 superior endplate compression fracture with 30% height loss was not included on the prior lumbar spine CT and is new from 2017 chest radiographs but not clearly acute. There is no T11 vertebral body retropulsion. No suspicious  osseous lesion is identified. Paraspinal and other soft tissues: No significant paravertebral soft tissue edema or hematoma at T11. Partially visualized ICD leads. Aortic and coronary artery atherosclerosis. Disc levels: Disc degeneration most advanced at T7-8 and T8-9 where there is severe disc space narrowing, endplate sclerosis, and Schmorl's nodes. Small central disc protrusions at T8-9 larger than T7-8 without evidence of significant spinal stenosis on this non-myelographic examination. Neural foraminal stenosis at these levels is most notable at T8-9, likely moderate bilaterally. Milder disc degeneration elsewhere in the thoracic spine. CT LUMBAR SPINE FINDINGS Segmentation: Standard. Alignment: Unchanged minimal anterolisthesis of L3 on L4. Vertebrae: L1 compression fracture with increasing osseous lucency subjacent to the superior endplate with surrounding sclerosis and increased vertebral body height loss compared to the prior CT, now 20%. L2 compression fracture with unchanged mild height loss. No new lumbar spine fracture. No suspicious osseous lesion. Paraspinal and other soft tissues: Mild paravertebral edema at L1. Abdominal aortic atherosclerosis without aneurysm. Disc levels: Similar appearance of disc and facet degeneration compared 2 last month CT with most notable at findings at L3-4 where there is anterolisthesis with bulging uncovered disc and moderate facet and ligamentum flavum hypertrophy resulting in mild spinal stenosis and mild right and mild-to-moderate left neural foraminal stenosis. IMPRESSION: 1. Mild T11 compression fracture, new from 2017 though not clearly acute and therefore of indeterminate age. 2. L1 compression fracture with mildly progressive height loss since 02/03/2018. 3. Unchanged T12 and L2 compression fractures. 4. Thoracic disc degeneration without evidence of significant spinal stenosis. Moderate neural foraminal stenosis at T8-9. 5. Lumbar disc and facet degeneration  most notable at L3-4 where there is mild spinal and mild-to-moderate neural foraminal stenosis. 6.  Aortic Atherosclerosis (ICD10-I70.0). Electronically Signed   By: Sebastian Ache M.D.   On: 03/11/2018 13:51      TODAY-DAY OF DISCHARGE:  Subjective:   [name] today has no headache,no chest abdominal pain,no new weakness tingling or numbness, feels much better wants to go home today.   Objective:   Blood pressure 110/67, pulse 66, temperature 98.1 F (36.7 C), temperature source Oral, resp. rate 12, height 5\' 3"  (1.6 m), weight 55 kg (121 lb 4.1 oz), SpO2 100 %.  Intake/Output Summary (Last 24 hours) at 03/13/2018 0942 Last data filed at 03/13/2018 0653 Gross per 24 hour  Intake 180 ml  Output 100 ml  Net 80 ml   Filed Weights   03/09/18 2219 03/11/18 2100 03/13/18 0434  Weight: 57.6 kg (127 lb) 56.3 kg (124 lb 1.9 oz)  55 kg (121 lb 4.1 oz)    Exam: Awake Alert, Oriented *3, No new F.N deficits, Normal affect [place].AT,PERRAL Supple Neck,No JVD, No cervical lymphadenopathy appriciated.  Symmetrical Chest wall movement, Good air movement bilaterally, CTAB RRR,No Gallops,Rubs or new Murmurs, No Parasternal Heave +ve B.Sounds, Abd Soft, Non tender, No organomegaly appriciated, No rebound -guarding or rigidity. No Cyanosis, Clubbing or edema, No new Rash or bruise   PERTINENT RADIOLOGIC STUDIES: Dg Thoracic Spine 2 View  Result Date: 03/09/2018 CLINICAL DATA:  Fall 2 days ago with back pain. EXAM: THORACIC SPINE 2 VIEWS COMPARISON:  Included portion from lumbar spine CT 02/03/2018. Chest radiograph 01/10/2016 FINDINGS: Chronic and unchanged T12 and upper lumbar compression fractures compared with recent lumbar spine CT. T11 compression fracture with approximately 30% loss of height anteriorly was not included in the field of view on prior exam and is age indeterminate, but appears new from 2017 chest radiograph. Mild scoliotic curvature of the upper thoracic spine. IMPRESSION: 1. Age  indeterminate T11 compression fracture with approximately 30% loss of height anteriorly. This is new from 2017 chest radiograph. 2. Unchanged compression fractures of T12 and the upper lumbar spine since CT last month. Electronically Signed   By: Rubye Oaks M.D.   On: 03/09/2018 23:53   Dg Lumbar Spine 2-3 Views  Result Date: 03/09/2018 CLINICAL DATA:  Fall 2 days ago with back pain. EXAM: LUMBAR SPINE - 2-3 VIEW COMPARISON:  Lumbar spine CT 02/03/2018 FINDINGS: Unchanged compression fractures of T12, L1, and L2 from prior exam. No significant progression. Compression fracture of T11 with approximately 30% loss of height was not previously included in the field of view and is age indeterminate, not definitely seen on chest radiograph of 01/10/2016. L3, L4, and L5 vertebral body heights are preserved. Similar anterolisthesis of L3 on L4. Posterior elements appear intact. Sacroiliac joints are congruent. Chronic angulation of the sacrococcygeal junction based on scout from prior lumbar spine CT. Advanced vascular calcifications in the abdomen. IMPRESSION: 1. Chronic and unchanged compression fractures of T12, L1, and L2 compared to lumbar spine CT last month. 2. T11 compression fracture with approximately 30% loss of height anteriorly was not previously included in the field of view and is age indeterminate. Electronically Signed   By: Rubye Oaks M.D.   On: 03/09/2018 23:49   Dg Pelvis 1-2 Views  Result Date: 03/09/2018 CLINICAL DATA:  Fall 2 days ago with back pain. EXAM: PELVIS - 1-2 VIEW COMPARISON:  None. FINDINGS: The cortical margins of the bony pelvis are intact. No fracture. Pubic symphysis and sacroiliac joints are congruent. Both femoral heads are well-seated in the respective acetabula. The bones are under mineralized. IMPRESSION: No visualized pelvic fracture. Electronically Signed   By: Rubye Oaks M.D.   On: 03/09/2018 23:50   Ct Thoracic Spine Wo Contrast  Result Date:  03/11/2018 CLINICAL DATA:  Severe low back pain radiating to the left lower extremity. Thoracolumbar compression fractures. EXAM: CT THORACIC AND LUMBAR SPINE WITHOUT CONTRAST TECHNIQUE: Multidetector CT imaging of the thoracic and lumbar spine was performed without contrast. Multiplanar CT image reconstructions were also generated. COMPARISON:  Thoracic and lumbar spine radiographs 03/09/2018. Lumbar spine CT 02/03/2018. Chest CTA 07/30/2012. FINDINGS: CT THORACIC SPINE FINDINGS Alignment: Mild upper thoracic levoscoliosis. Straightening of the normal kyphosis in the lower thoracic spine. Trace anterolisthesis of T10 on T11. Vertebrae: T12 superior endplate fracture with close to 50% height loss anteriorly is unchanged from the prior lumbar spine CT. A T11 superior endplate compression  fracture with 30% height loss was not included on the prior lumbar spine CT and is new from 2017 chest radiographs but not clearly acute. There is no T11 vertebral body retropulsion. No suspicious osseous lesion is identified. Paraspinal and other soft tissues: No significant paravertebral soft tissue edema or hematoma at T11. Partially visualized ICD leads. Aortic and coronary artery atherosclerosis. Disc levels: Disc degeneration most advanced at T7-8 and T8-9 where there is severe disc space narrowing, endplate sclerosis, and Schmorl's nodes. Small central disc protrusions at T8-9 larger than T7-8 without evidence of significant spinal stenosis on this non-myelographic examination. Neural foraminal stenosis at these levels is most notable at T8-9, likely moderate bilaterally. Milder disc degeneration elsewhere in the thoracic spine. CT LUMBAR SPINE FINDINGS Segmentation: Standard. Alignment: Unchanged minimal anterolisthesis of L3 on L4. Vertebrae: L1 compression fracture with increasing osseous lucency subjacent to the superior endplate with surrounding sclerosis and increased vertebral body height loss compared to the prior CT,  now 20%. L2 compression fracture with unchanged mild height loss. No new lumbar spine fracture. No suspicious osseous lesion. Paraspinal and other soft tissues: Mild paravertebral edema at L1. Abdominal aortic atherosclerosis without aneurysm. Disc levels: Similar appearance of disc and facet degeneration compared 2 last month CT with most notable at findings at L3-4 where there is anterolisthesis with bulging uncovered disc and moderate facet and ligamentum flavum hypertrophy resulting in mild spinal stenosis and mild right and mild-to-moderate left neural foraminal stenosis. IMPRESSION: 1. Mild T11 compression fracture, new from 2017 though not clearly acute and therefore of indeterminate age. 2. L1 compression fracture with mildly progressive height loss since 02/03/2018. 3. Unchanged T12 and L2 compression fractures. 4. Thoracic disc degeneration without evidence of significant spinal stenosis. Moderate neural foraminal stenosis at T8-9. 5. Lumbar disc and facet degeneration most notable at L3-4 where there is mild spinal and mild-to-moderate neural foraminal stenosis. 6.  Aortic Atherosclerosis (ICD10-I70.0). Electronically Signed   By: Sebastian Ache M.D.   On: 03/11/2018 13:51   Ct Lumbar Spine Wo Contrast  Result Date: 03/11/2018 CLINICAL DATA:  Severe low back pain radiating to the left lower extremity. Thoracolumbar compression fractures. EXAM: CT THORACIC AND LUMBAR SPINE WITHOUT CONTRAST TECHNIQUE: Multidetector CT imaging of the thoracic and lumbar spine was performed without contrast. Multiplanar CT image reconstructions were also generated. COMPARISON:  Thoracic and lumbar spine radiographs 03/09/2018. Lumbar spine CT 02/03/2018. Chest CTA 07/30/2012. FINDINGS: CT THORACIC SPINE FINDINGS Alignment: Mild upper thoracic levoscoliosis. Straightening of the normal kyphosis in the lower thoracic spine. Trace anterolisthesis of T10 on T11. Vertebrae: T12 superior endplate fracture with close to 50% height  loss anteriorly is unchanged from the prior lumbar spine CT. A T11 superior endplate compression fracture with 30% height loss was not included on the prior lumbar spine CT and is new from 2017 chest radiographs but not clearly acute. There is no T11 vertebral body retropulsion. No suspicious osseous lesion is identified. Paraspinal and other soft tissues: No significant paravertebral soft tissue edema or hematoma at T11. Partially visualized ICD leads. Aortic and coronary artery atherosclerosis. Disc levels: Disc degeneration most advanced at T7-8 and T8-9 where there is severe disc space narrowing, endplate sclerosis, and Schmorl's nodes. Small central disc protrusions at T8-9 larger than T7-8 without evidence of significant spinal stenosis on this non-myelographic examination. Neural foraminal stenosis at these levels is most notable at T8-9, likely moderate bilaterally. Milder disc degeneration elsewhere in the thoracic spine. CT LUMBAR SPINE FINDINGS Segmentation: Standard. Alignment: Unchanged minimal anterolisthesis  of L3 on L4. Vertebrae: L1 compression fracture with increasing osseous lucency subjacent to the superior endplate with surrounding sclerosis and increased vertebral body height loss compared to the prior CT, now 20%. L2 compression fracture with unchanged mild height loss. No new lumbar spine fracture. No suspicious osseous lesion. Paraspinal and other soft tissues: Mild paravertebral edema at L1. Abdominal aortic atherosclerosis without aneurysm. Disc levels: Similar appearance of disc and facet degeneration compared 2 last month CT with most notable at findings at L3-4 where there is anterolisthesis with bulging uncovered disc and moderate facet and ligamentum flavum hypertrophy resulting in mild spinal stenosis and mild right and mild-to-moderate left neural foraminal stenosis. IMPRESSION: 1. Mild T11 compression fracture, new from 2017 though not clearly acute and therefore of indeterminate  age. 2. L1 compression fracture with mildly progressive height loss since 02/03/2018. 3. Unchanged T12 and L2 compression fractures. 4. Thoracic disc degeneration without evidence of significant spinal stenosis. Moderate neural foraminal stenosis at T8-9. 5. Lumbar disc and facet degeneration most notable at L3-4 where there is mild spinal and mild-to-moderate neural foraminal stenosis. 6.  Aortic Atherosclerosis (ICD10-I70.0). Electronically Signed   By: Sebastian Ache M.D.   On: 03/11/2018 13:51     PERTINENT LAB RESULTS: CBC: Recent Labs    03/11/18 0714 03/12/18 0404  WBC 7.3 7.9  HGB 12.6 11.7*  HCT 40.2 36.7  PLT 185 176   CMET CMP     Component Value Date/Time   NA 140 03/12/2018 0404   NA 137 02/19/2018   K 3.9 03/12/2018 0404   CL 108 03/12/2018 0404   CO2 20 (L) 03/12/2018 0404   GLUCOSE 126 (H) 03/12/2018 0404   GLUCOSE 112 (H) 08/14/2006 1339   BUN 12 03/12/2018 0404   BUN 14 02/19/2018   CREATININE 1.95 (H) 03/12/2018 0404   CREATININE 1.29 (H) 06/07/2016 1202   CALCIUM 9.1 03/12/2018 0404   PROT 7.3 03/10/2018 0607   ALBUMIN 3.2 (L) 03/10/2018 0607   AST 25 03/10/2018 0607   ALT 20 03/10/2018 0607   ALKPHOS 108 03/10/2018 0607   BILITOT 0.7 03/10/2018 0607   GFRNONAA 23 (L) 03/12/2018 0404   GFRAA 27 (L) 03/12/2018 0404    GFR Estimated Creatinine Clearance: 18.7 mL/min (A) (by C-G formula based on SCr of 1.95 mg/dL (H)). No results for input(s): LIPASE, AMYLASE in the last 72 hours. No results for input(s): CKTOTAL, CKMB, CKMBINDEX, TROPONINI in the last 72 hours. Invalid input(s): POCBNP No results for input(s): DDIMER in the last 72 hours. No results for input(s): HGBA1C in the last 72 hours. No results for input(s): CHOL, HDL, LDLCALC, TRIG, CHOLHDL, LDLDIRECT in the last 72 hours. No results for input(s): TSH, T4TOTAL, T3FREE, THYROIDAB in the last 72 hours.  Invalid input(s): FREET3 No results for input(s): VITAMINB12, FOLATE, FERRITIN, TIBC,  IRON, RETICCTPCT in the last 72 hours. Coags: No results for input(s): INR in the last 72 hours.  Invalid input(s): PT Microbiology: No results found for this or any previous visit (from the past 240 hour(s)).  FURTHER DISCHARGE INSTRUCTIONS:  Get Medicines reviewed and adjusted: Please take all your medications with you for your next visit with your Primary MD  Laboratory/radiological data: Please request your Primary MD to go over all hospital tests and procedure/radiological results at the follow up, please ask your Primary MD to get all Hospital records sent to his/her office.  In some cases, they will be blood work, cultures and biopsy results pending at the time of  your discharge. Please request that your primary care M.D. goes through all the records of your hospital data and follows up on these results.  Also Note the following: If you experience worsening of your admission symptoms, develop shortness of breath, life threatening emergency, suicidal or homicidal thoughts you must seek medical attention immediately by calling 911 or calling your MD immediately  if symptoms less severe.  You must read complete instructions/literature along with all the possible adverse reactions/side effects for all the Medicines you take and that have been prescribed to you. Take any new Medicines after you have completely understood and accpet all the possible adverse reactions/side effects.   Do not drive when taking Pain medications or sleeping medications (Benzodaizepines)  Do not take more than prescribed Pain, Sleep and Anxiety Medications. It is not advisable to combine anxiety,sleep and pain medications without talking with your primary care practitioner  Special Instructions: If you have smoked or chewed Tobacco  in the last 2 yrs please stop smoking, stop any regular Alcohol  and or any Recreational drug use.  Wear Seat belts while driving.  Please note: You were cared for by a  hospitalist during your hospital stay. Once you are discharged, your primary care physician will handle any further medical issues. Please note that NO REFILLS for any discharge medications will be authorized once you are discharged, as it is imperative that you return to your primary care physician (or establish a relationship with a primary care physician if you do not have one) for your post hospital discharge needs so that they can reassess your need for medications and monitor your lab values.  Total Time spent coordinating discharge including counseling, education and face to face time equals  45 minutes.  SignedJeoffrey Massed 03/13/2018 9:42 AM

## 2018-03-13 NOTE — Progress Notes (Signed)
Physical Therapy Treatment Patient Details Name: [name] [name] MRN: [id number] DOB: 21-Jul-1934 Today's Date: 03/13/2018    History of Present Illness Pt is a 84 y.o. F with significant PMH of diabetes mellitus type 2, hypertension, hypothyroidism, chronic combined CHF who was recently admitted and discharged on February 14, 2018 after patient was admitted for acute renal failure to SNF. Recently discharged from rehab to home and presents currently with fatigue and weakness. Found to have acute compression fx T11 with chronic compression fx of T12, L1, and L2.      PT Comments    Patient is progressing very well towards their physical therapy goals. Increased ambulation distance to 120 feet with walker and min guard assist. Continues to require multimodal cues for safety with mobility. Reviewed in detail application of brace (including donning/doffing) with patient son; he verbalized understanding. Plan for discharge home today.    Follow Up Recommendations  Home health PT;Supervision/Assistance - 24 hour     Equipment Recommendations  None recommended by PT    Recommendations for Other Services       Precautions / Restrictions Precautions Precautions: Fall;Back Precaution Booklet Issued: No Required Braces or Orthoses: Spinal Brace Spinal Brace: Thoracolumbosacral orthotic Restrictions Weight Bearing Restrictions: No    Mobility  Bed Mobility Overal bed mobility: Modified Independent             General bed mobility comments: increased time and effort but no physical assistance required. cues to use bed rail for assist with rolling  Transfers Overall transfer level: Needs assistance Equipment used: Rolling walker (2 wheeled) Transfers: Sit to/from Stand Sit to Stand: Min assist;Min guard         General transfer comment: Hand over hand guidance for safe hand placement. With turning to sit down on toilet and seat, patient needs tactile cueing at hips to turn fully to  square hips to surface. Up to min assist from low surface of toilet but mostly min guard assist provided.   Ambulation/Gait Ambulation/Gait assistance: Min guard Gait Distance (Feet): 120 Feet Assistive device: Rolling walker (2 wheeled) Gait Pattern/deviations: Step-through pattern;Decreased stride length Gait velocity: decr   General Gait Details: Max verbal cueing for maintaining adquate walker proximity and for posture. Occasional directional cueing provided to maintain a straight path without deviation and for turning.   Stairs             Wheelchair Mobility    Modified Rankin (Stroke Patients Only)       Balance Overall balance assessment: Needs assistance Sitting-balance support: Feet supported;Bilateral upper extremity supported Sitting balance-Leahy Scale: Good     Standing balance support: Bilateral upper extremity supported Standing balance-Leahy Scale: Fair Standing balance comment: Able to maintain static standing at sink with no UE support but needs external support for dynamic balance                             Cognition Arousal/Alertness: Awake/alert Behavior During Therapy: Flat affect Overall Cognitive Status: Impaired/Different from baseline Area of Impairment: Orientation;Memory;Following commands;Safety/judgement;Awareness;Problem solving                 Orientation Level: Disoriented to;Time   Memory: Decreased recall of precautions;Decreased short-term memory Following Commands: Follows one step commands with increased time Safety/Judgement: Decreased awareness of safety;Decreased awareness of deficits Awareness: Emergent Problem Solving: Requires verbal cues;Requires tactile cues;Slow processing;Decreased initiation General Comments: Multimodal cues required for safety with mobility during turns and avoiding running into  objects.      Exercises      General Comments        Pertinent Vitals/Pain Pain Assessment:  Faces Faces Pain Scale: Hurts little more Pain Location: back Pain Descriptors / Indicators: Guarding Pain Intervention(s): Monitored during session;Premedicated before session    Home Living                      Prior Function            PT Goals (current goals can now be found in the care plan section) Acute Rehab PT Goals Patient Stated Goal: none stated but agreeable to participate with therapy PT Goal Formulation: With patient Time For Goal Achievement: 03/26/18 Potential to Achieve Goals: Good Progress towards PT goals: Progressing toward goals    Frequency    Min 3X/week      PT Plan Current plan remains appropriate    Co-evaluation              AM-PAC PT "6 Clicks" Daily Activity  Outcome Measure  Difficulty turning over in bed (including adjusting bedclothes, sheets and blankets)?: A Little Difficulty moving from lying on back to sitting on the side of the bed? : A Little Difficulty sitting down on and standing up from a chair with arms (e.g., wheelchair, bedside commode, etc,.)?: A Little Help needed moving to and from a bed to chair (including a wheelchair)?: A Little Help needed walking in hospital room?: A Little Help needed climbing 3-5 steps with a railing? : A Lot 6 Click Score: 17    End of Session Equipment Utilized During Treatment: Gait belt;Back brace Activity Tolerance: Patient tolerated treatment well Patient left: with call bell/phone within reach;in bed;with bed alarm set;with family/visitor present Nurse Communication: Mobility status PT Visit Diagnosis: Unsteadiness on feet (R26.81);Muscle weakness (generalized) (M62.81);Difficulty in walking, not elsewhere classified (R26.2)     Time: 3143-8887 PT Time Calculation (min) (ACUTE ONLY): 33 min  Charges:  $Gait Training: 23-37 mins                     Laurina Bustle, [place], DPT Acute Rehabilitation Services  Pager: [phone]    Vanetta Mulders 03/13/2018, 11:54  AM

## 2018-03-13 NOTE — Plan of Care (Signed)
  Problem: Health Behavior/Discharge Planning: Goal: Ability to manage health-related needs will improve Outcome: Progressing   Problem: Clinical Measurements: Goal: Ability to maintain clinical measurements within normal limits will improve Outcome: Progressing   Problem: Pain Managment: Goal: General experience of comfort will improve Outcome: Progressing   

## 2018-03-13 NOTE — Consult Note (Addendum)
   [facility] CM Inpatient Consult   03/13/2018  [name] [name] 10/09/1933 [id number]    [facility] Care Management follow up.  Spoke with [name] at bedside. She was awake, alert and oriented. Made her aware that writer spoke with her son Carmelia Roller and he is agreeable to [facility] Care Management services for her.  Ms. Carbon agreeable as well and [facility] Care Management written consent obtained. She is also agreeable to her son's being contacted for post discharge calls. Roland Earl [phone] and Janeann Forehand [phone].  Call made to [facility] to make aware that [facility] folder left at bedside for his records.  Will make referral to [facility].  Please see notes from initial encounter by hospital liaison on 03/12/18.   Raiford Noble, MSN-Ed, RN,BSN [facility] Liaison [phone]

## 2018-03-16 ENCOUNTER — Other Ambulatory Visit (HOSPITAL_COMMUNITY): Payer: Self-pay | Admitting: Interventional Radiology

## 2018-03-16 ENCOUNTER — Other Ambulatory Visit: Payer: Self-pay

## 2018-03-16 ENCOUNTER — Encounter: Payer: Self-pay | Admitting: Internal Medicine

## 2018-03-16 ENCOUNTER — Telehealth (HOSPITAL_COMMUNITY): Payer: Self-pay

## 2018-03-16 ENCOUNTER — Other Ambulatory Visit: Payer: Self-pay | Admitting: *Deleted

## 2018-03-16 DIAGNOSIS — I130 Hypertensive heart and chronic kidney disease with heart failure and stage 1 through stage 4 chronic kidney disease, or unspecified chronic kidney disease: Secondary | ICD-10-CM | POA: Diagnosis not present

## 2018-03-16 DIAGNOSIS — S32020D Wedge compression fracture of second lumbar vertebra, subsequent encounter for fracture with routine healing: Secondary | ICD-10-CM | POA: Diagnosis not present

## 2018-03-16 DIAGNOSIS — G9341 Metabolic encephalopathy: Secondary | ICD-10-CM | POA: Diagnosis not present

## 2018-03-16 DIAGNOSIS — I5032 Chronic diastolic (congestive) heart failure: Secondary | ICD-10-CM | POA: Diagnosis not present

## 2018-03-16 DIAGNOSIS — I2510 Atherosclerotic heart disease of native coronary artery without angina pectoris: Secondary | ICD-10-CM | POA: Diagnosis not present

## 2018-03-16 DIAGNOSIS — S32010A Wedge compression fracture of first lumbar vertebra, initial encounter for closed fracture: Secondary | ICD-10-CM

## 2018-03-16 DIAGNOSIS — E1122 Type 2 diabetes mellitus with diabetic chronic kidney disease: Secondary | ICD-10-CM | POA: Diagnosis not present

## 2018-03-16 DIAGNOSIS — S32020A Wedge compression fracture of second lumbar vertebra, initial encounter for closed fracture: Secondary | ICD-10-CM

## 2018-03-16 NOTE — Patient Outreach (Signed)
[facility]) Care Management  03/16/2018  [name] [name] November 18, 1933 [id number]   Referral received from hospital liaison as member was recently admitted 7/29-8/2 for acute renal failure.  Primary MD office will perform transition of care assessment.  Per chart, she also has history of hypertension, heart failure, hypothyroidism, diabetes, and hyperlipidemia.    Call placed to member's son per request.  Member's identity verified.  This care manager introduced self and purpose of call, [facility] care management services explained.  He report member is progressing well, confirms she has all medications according to discharge instructions and has follow up appointment scheduled.  Agrees to home visit next week, denies any urgent concerns.  Will perform initial home assessment of needs at that time.  [facility] CM Care Plan Problem One     Most Recent Value  Care Plan Problem One  Risk for readmission related to kidney failure as evidenced by recent hospitalization  Role Documenting the Problem One  Care Management Coordinator  Care Plan for Problem One  Active  [facility] Long Term Goal   Member will not be readmitted to hospital within the next 31 days  [facility] Long Term Goal Start Date  03/16/18  Interventions for Problem One Long Term Goal  Discussed with member the importance of following discharge instructions to decrease the risk of readmission  [facility] CM Short Term Goal #1   Member will keep and attend follow up appointment with primary MD within the next week  [facility] CM Short Term Goal #1 Start Date  03/16/18  Interventions for Short Term Goal #1  Verifed with son that member has follow up appointment and reliable transportation     Kemper Durie, Charity fundraiser, MSN [facility] Care Management  [facility] Care Manager [phone]

## 2018-03-16 NOTE — Patient Outreach (Signed)
[facility]) Care Management  03/16/2018  [name] [name] 1934-07-17 [id number]   Care coordination: Per chart review, Update noted from [facility], Raiford Noble, [place] that patient has been referred to [facility] community case manager.   PLAN; No further follow up needed by this RNCM at this time.   George Ina RN,BSN,CCM [facility] Telephonic  [phone]

## 2018-03-16 NOTE — Telephone Encounter (Signed)
Called to schedule consult for compression fracture, no answer, left vm. AW 

## 2018-03-17 ENCOUNTER — Other Ambulatory Visit (HOSPITAL_COMMUNITY): Payer: Self-pay | Admitting: Interventional Radiology

## 2018-03-17 ENCOUNTER — Ambulatory Visit (HOSPITAL_COMMUNITY)
Admission: RE | Admit: 2018-03-17 | Discharge: 2018-03-17 | Disposition: A | Discharge status: Home / Self Care | Payer: Medicare Other | Source: Ambulatory Visit | Attending: Interventional Radiology | Admitting: Interventional Radiology

## 2018-03-17 DIAGNOSIS — S32010A Wedge compression fracture of first lumbar vertebra, initial encounter for closed fracture: Secondary | ICD-10-CM | POA: Diagnosis not present

## 2018-03-17 DIAGNOSIS — S32020A Wedge compression fracture of second lumbar vertebra, initial encounter for closed fracture: Secondary | ICD-10-CM

## 2018-03-17 NOTE — Consult Note (Signed)
Chief Complaint: Patient was seen in consultation today for lumbar 1 and lumbar 2 compression fractures.  Referring Physician(s): Rise Patience  Supervising Physician: Luanne Bras  Patient Status: [facility] - Out-pt  History of Present Illness: [name] [name] is a 84 y.o. female with a past medical history of hypertension, LBBB, dilated cardiomyopathy, CAD, combined systolic and diastolic HF, high cholesterol, CVA, asthma, GERD, hypothyroidism, diabetes mellitus type II, renal insufficiency, sickle cell anemia, OA, osteoporosis, and multiple myeloma. A few weeks ago, she had a fall and experienced low back pain. She then had a repeat fall a few days later which increased her low back pain.  CT thoracic/lumbar spine 03/11/2018: 1. Mild T11 compression fracture, new from 2017 though not clearly acute and therefore of indeterminate age. 2. L1 compression fracture with mildly progressive height loss since 02/03/2018. 3. Unchanged T12 and L2 compression fractures. 4. Thoracic disc degeneration without evidence of significant spinal stenosis. Moderate neural foraminal stenosis at T8-9. 5. Lumbar disc and facet degeneration most notable at L3-4 where there is mild spinal and mild-to-moderate neural foraminal stenosis. 6.  Aortic Atherosclerosis (ICD10-I70.0).  IR requested by Dr. Hal Hope for management of lumbar 1 and lumbar 2 compression fractures. Patient awake and alert sitting in wheelchair. Accompanied by son. Complains of low back pain. States that she has 0/10 pain now, but when it is at its worst she rates her pain 10/10. States walking and moving makes her pain worse. Has taken Oxycodone for pain. States she goes to PT to help return to normal baseline function. States she uses a cane at baseline. Denies fever, chills, numbness/tingling down legs, bladder/bowel incontinence, or urinary symptoms including dysuria or frequency.  Patient is currently taking Plavix 75 mg once  daily.   Past Medical History:  Diagnosis Date  . Chronic combined systolic and diastolic CHF, NYHA class 3 ([place])    a. 03/2013 Echo: EF 25-30%, diff HK, sev antsept HK, mild MR.  . Congestive dilated cardiomyopathy ([place])    a. 03/2013 Echo: EF 25-30%;  b. 09/2013 s/p SJM 3242 CRT-P.  Marland Kitchen Coronary artery disease    a. 08/2012 Cath: LM nl, LAD 77m LCX nondom, mild-mod nonobs dzs mid, OM1 ok, OM2 90/90p, RCA 40, EF 25-30%-->Med Rx.  . GERD (gastroesophageal reflux disease)   . High cholesterol   . Hypertension   . Hypothyroidism   . Left bundle branch block   . Osteoarthritis   . [place]   . Type II diabetes mellitus ([place]     Past Surgical History:  Procedure Laterality Date  . ABDOMINAL HYSTERECTOMY  1980  . BI-VENTRICULAR PACEMAKER INSERTION N/A 10/01/2013   Procedure: BI-VENTRICULAR PACEMAKER INSERTION (CRT-P);  Surgeon: SDeboraha Sprang MD;  [facility];  Service: Cardiovascular;  Laterality: N/A;  . BI-VENTRICULAR PACEMAKER INSERTION (CRT-P)     a. 09/2013 s/p SJM 3242 CRT-P.  . Bilateral cateract surgery    . BMD  2006  . IR GENERIC HISTORICAL  10/18/2016   IR ANGIO INTRA EXTRACRAN SEL COM CAROTID INNOMINATE BILAT MOD SED 10/18/2016 SLuanne Bras MD [facility]-INTERV RAD  . IR GENERIC HISTORICAL  10/18/2016   IR ANGIO VERTEBRAL SEL SUBCLAVIAN INNOMINATE BILAT MOD SED 10/18/2016 SLuanne Bras MD [facility]INTER[facility] RAD  . ORIF TIBIA PLATEAU Right 12/04/2013   Procedure: OPEN REDUCTION INTERNAL FIXATION (ORIF) TIBIAL PLATEAU;  Surgeon: SAugustin Schooling MD;  Location: [facility] ORS;  Service: Orthopedics;  Laterality: Right;  . PERIPHERAL VASCULAR CATHETERIZATION N/A 06/17/2016   Procedure: Lower Extremity  Angiography;  Surgeon: Lorretta Harp, MD;  Location: [place] CV LAB;  Service: Cardiovascular;  Laterality: N/A;  . PERIPHERAL VASCULAR CATHETERIZATION Left 06/17/2016   Procedure: Peripheral Vascular Atherectomy;  Surgeon: Lorretta Harp, MD;  Location: [place] CV LAB;  Service:  Cardiovascular;  Laterality: Left;  Popliteal   . RIGHT HEART CATHETERIZATION N/A 09/08/2012   Procedure: RIGHT HEART CATH;  Surgeon: Jolaine Artist, MD;  Location: [facility] CATH LAB;  Service: Cardiovascular;  Laterality: N/A;    Allergies: Statins; Metformin and related; and Vytorin [ezetimibe-simvastatin]  Medications: Prior to Admission medications   Medication Sig Start Date End Date Taking? Authorizing Provider  acetaminophen (TYLENOL) 500 MG tablet Take 2 tablets (1,000 mg total) by mouth every 8 (eight) hours as needed. 03/13/18 04/12/18  GhimireHenreitta Leber, MD  aspirin EC 81 MG tablet Take 81 mg by mouth daily.     [provider]  calcitonin, salmon, (MIACALCIN/FORTICAL) 200 UNIT/ACT nasal spray Place 1 spray into alternate nostrils daily. 03/13/18   Ghimire, Henreitta Leber, MD  carvedilol (COREG) 12.5 MG tablet Take 6.25 mg by mouth 2 (two) times daily.    [provider]  Cholecalciferol (VITAMIN D3) 5000 units CAPS Take by mouth. Take 1 capsule by mouth every 7 days    [provider]  clopidogrel (PLAVIX) 75 MG tablet TAKE 1 TABLET BY MOUTH ONCE DAILY 01/12/18   Janith Lima, MD  FREESTYLE LITE test strip USE 1 STRIP TO CHECK GLUCOSE TWICE DAILY 02/09/18   Janith Lima, MD  insulin aspart (NOVOLOG) 100 UNIT/ML injection Inject 0-9 Units into the skin 3 (three) times daily with meals. 02/14/18   Elodia Florence., MD  levothyroxine (SYNTHROID, LEVOTHROID) 50 MCG tablet TAKE 1 TABLET BY MOUTH ONCE DAILY 09/01/17   Philemon Kingdom, MD  lidocaine (LIDODERM) 5 % Place 1 patch onto the skin daily. Remove & Discard patch within 12 hours or as directed by MD 03/13/18   Jonetta Osgood, MD  magnesium oxide (MAG-OX) 400 (241.3 Mg) MG tablet Take 1 tablet (400 mg total) by mouth daily. 02/15/18 03/17/18  Elodia Florence., MD  Multiple Vitamin (MULTIVITAMIN WITH MINERALS) TABS tablet Take 1 tablet by mouth daily. 02/15/18 03/17/18  Elodia Florence., MD  OxyCODONE HCl,  Abuse Deter, (OXAYDO) 5 MG TABA Take 5 mg by mouth every 8 (eight) hours as needed. 03/13/18   Ghimire, Henreitta Leber, MD  rosuvastatin (CRESTOR) 20 MG tablet TAKE 1 TABLET BY MOUTH ONCE DAILY Patient taking differently: TAKE 1 TABLET BY MOUTH IN THE EVENING 01/20/18   Janith Lima, MD     Family History  Problem Relation Age of Onset  . Diabetes Mother   . Diabetes Other   . Hypertension Other   . Uterine cancer Other   . Heart attack Brother     Social History   Socioeconomic History  . Marital status: Divorced    Spouse name: Not on file  . Number of children: 4  . Years of education: Not on file  . Highest education level: Not on file  Occupational History  . Occupation: Retired Ship broker  . Financial resource strain: Not on file  . Food insecurity:    Worry: Not on file    Inability: Not on file  . Transportation needs:    Medical: Not on file    Non-medical: Not on file  Tobacco Use  . Smoking status: Former Research scientist (life sciences)  . Smokeless  tobacco: Former Network engineer and Sexual Activity  . Alcohol use: No  . Drug use: No  . Sexual activity: Not Currently  Lifestyle  . Physical activity:    Days per week: Not on file    Minutes per session: Not on file  . Stress: Not on file  Relationships  . Social connections:    Talks on phone: Not on file    Gets together: Not on file    Attends religious service: Not on file    Active member of club or organization: Not on file    Attends meetings of clubs or organizations: Not on file    Relationship status: Not on file  Other Topics Concern  . Not on file  Social History Narrative   Widowed.  Lives with her son.  Normally able to ambulate without assistance.  Quit smoking as a teenager.                Review of Systems: A 12 point ROS discussed and pertinent positives are indicated in the HPI above.  All other systems are negative.  Review of Systems  Constitutional: Negative for chills and fever.    Respiratory: Negative for shortness of breath and wheezing.   Cardiovascular: Negative for chest pain and palpitations.  Gastrointestinal:       Negative for bowel incontinence.  Genitourinary:       Negative for bladder incontinence.  Musculoskeletal: Positive for back pain.  Neurological: Negative for numbness.  Psychiatric/Behavioral: Negative for behavioral problems and confusion.    Vital Signs: There were no vitals taken for this visit.  Physical Exam  Constitutional: She is oriented to person, place, and time. She appears well-developed and well-nourished. No distress.  Pulmonary/Chest: Effort normal. No respiratory distress.  Musculoskeletal:  Mild-moderate midline low back pain in the lumbar region.  Neurological: She is alert and oriented to person, place, and time.  Skin: Skin is warm and dry.  Psychiatric: She has a normal mood and affect. Her behavior is normal. Judgment and thought content normal.  Nursing note and vitals reviewed.    Imaging: Dg Thoracic Spine 2 View  Result Date: 03/09/2018 CLINICAL DATA:  Fall 2 days ago with back pain. EXAM: THORACIC SPINE 2 VIEWS COMPARISON:  Included portion from lumbar spine CT 02/03/2018. Chest radiograph 01/10/2016 FINDINGS: Chronic and unchanged T12 and upper lumbar compression fractures compared with recent lumbar spine CT. T11 compression fracture with approximately 30% loss of height anteriorly was not included in the field of view on prior exam and is age indeterminate, but appears new from 2017 chest radiograph. Mild scoliotic curvature of the upper thoracic spine. IMPRESSION: 1. Age indeterminate T11 compression fracture with approximately 30% loss of height anteriorly. This is new from 2017 chest radiograph. 2. Unchanged compression fractures of T12 and the upper lumbar spine since CT last month. Electronically Signed   By: Jeb Levering M.D.   On: 03/09/2018 23:53   Dg Lumbar Spine 2-3 Views  Result Date:  03/09/2018 CLINICAL DATA:  Fall 2 days ago with back pain. EXAM: LUMBAR SPINE - 2-3 VIEW COMPARISON:  Lumbar spine CT 02/03/2018 FINDINGS: Unchanged compression fractures of T12, L1, and L2 from prior exam. No significant progression. Compression fracture of T11 with approximately 30% loss of height was not previously included in the field of view and is age indeterminate, not definitely seen on chest radiograph of 01/10/2016. L3, L4, and L5 vertebral body heights are preserved. Similar anterolisthesis of L3 on L4.  Posterior elements appear intact. Sacroiliac joints are congruent. Chronic angulation of the sacrococcygeal junction based on scout from prior lumbar spine CT. Advanced vascular calcifications in the abdomen. IMPRESSION: 1. Chronic and unchanged compression fractures of T12, L1, and L2 compared to lumbar spine CT last month. 2. T11 compression fracture with approximately 30% loss of height anteriorly was not previously included in the field of view and is age indeterminate. Electronically Signed   By: Jeb Levering M.D.   On: 03/09/2018 23:49   Dg Pelvis 1-2 Views  Result Date: 03/09/2018 CLINICAL DATA:  Fall 2 days ago with back pain. EXAM: PELVIS - 1-2 VIEW COMPARISON:  None. FINDINGS: The cortical margins of the bony pelvis are intact. No fracture. Pubic symphysis and sacroiliac joints are congruent. Both femoral heads are well-seated in the respective acetabula. The bones are under mineralized. IMPRESSION: No visualized pelvic fracture. Electronically Signed   By: Jeb Levering M.D.   On: 03/09/2018 23:50   Ct Thoracic Spine Wo Contrast  Result Date: 03/11/2018 CLINICAL DATA:  Severe low back pain radiating to the left lower extremity. Thoracolumbar compression fractures. EXAM: CT THORACIC AND LUMBAR SPINE WITHOUT CONTRAST TECHNIQUE: Multidetector CT imaging of the thoracic and lumbar spine was performed without contrast. Multiplanar CT image reconstructions were also generated.  COMPARISON:  Thoracic and lumbar spine radiographs 03/09/2018. Lumbar spine CT 02/03/2018. Chest CTA 07/30/2012. FINDINGS: CT THORACIC SPINE FINDINGS Alignment: Mild upper thoracic levoscoliosis. Straightening of the normal kyphosis in the lower thoracic spine. Trace anterolisthesis of T10 on T11. Vertebrae: T12 superior endplate fracture with close to 50% height loss anteriorly is unchanged from the prior lumbar spine CT. A T11 superior endplate compression fracture with 30% height loss was not included on the prior lumbar spine CT and is new from 2017 chest radiographs but not clearly acute. There is no T11 vertebral body retropulsion. No suspicious osseous lesion is identified. Paraspinal and other soft tissues: No significant paravertebral soft tissue edema or hematoma at T11. Partially visualized ICD leads. Aortic and coronary artery atherosclerosis. Disc levels: Disc degeneration most advanced at T7-8 and T8-9 where there is severe disc space narrowing, endplate sclerosis, and Schmorl's nodes. Small central disc protrusions at T8-9 larger than T7-8 without evidence of significant spinal stenosis on this non-myelographic examination. Neural foraminal stenosis at these levels is most notable at T8-9, likely moderate bilaterally. Milder disc degeneration elsewhere in the thoracic spine. CT LUMBAR SPINE FINDINGS Segmentation: Standard. Alignment: Unchanged minimal anterolisthesis of L3 on L4. Vertebrae: L1 compression fracture with increasing osseous lucency subjacent to the superior endplate with surrounding sclerosis and increased vertebral body height loss compared to the prior CT, now 20%. L2 compression fracture with unchanged mild height loss. No new lumbar spine fracture. No suspicious osseous lesion. Paraspinal and other soft tissues: Mild paravertebral edema at L1. Abdominal aortic atherosclerosis without aneurysm. Disc levels: Similar appearance of disc and facet degeneration compared 2 last month CT  with most notable at findings at L3-4 where there is anterolisthesis with bulging uncovered disc and moderate facet and ligamentum flavum hypertrophy resulting in mild spinal stenosis and mild right and mild-to-moderate left neural foraminal stenosis. IMPRESSION: 1. Mild T11 compression fracture, new from 2017 though not clearly acute and therefore of indeterminate age. 2. L1 compression fracture with mildly progressive height loss since 02/03/2018. 3. Unchanged T12 and L2 compression fractures. 4. Thoracic disc degeneration without evidence of significant spinal stenosis. Moderate neural foraminal stenosis at T8-9. 5. Lumbar disc and facet degeneration most notable at  L3-4 where there is mild spinal and mild-to-moderate neural foraminal stenosis. 6.  Aortic Atherosclerosis (ICD10-I70.0). Electronically Signed   By: Logan Bores M.D.   On: 03/11/2018 13:51   Ct Lumbar Spine Wo Contrast  Result Date: 03/11/2018 CLINICAL DATA:  Severe low back pain radiating to the left lower extremity. Thoracolumbar compression fractures. EXAM: CT THORACIC AND LUMBAR SPINE WITHOUT CONTRAST TECHNIQUE: Multidetector CT imaging of the thoracic and lumbar spine was performed without contrast. Multiplanar CT image reconstructions were also generated. COMPARISON:  Thoracic and lumbar spine radiographs 03/09/2018. Lumbar spine CT 02/03/2018. Chest CTA 07/30/2012. FINDINGS: CT THORACIC SPINE FINDINGS Alignment: Mild upper thoracic levoscoliosis. Straightening of the normal kyphosis in the lower thoracic spine. Trace anterolisthesis of T10 on T11. Vertebrae: T12 superior endplate fracture with close to 50% height loss anteriorly is unchanged from the prior lumbar spine CT. A T11 superior endplate compression fracture with 30% height loss was not included on the prior lumbar spine CT and is new from 2017 chest radiographs but not clearly acute. There is no T11 vertebral body retropulsion. No suspicious osseous lesion is identified.  Paraspinal and other soft tissues: No significant paravertebral soft tissue edema or hematoma at T11. Partially visualized ICD leads. Aortic and coronary artery atherosclerosis. Disc levels: Disc degeneration most advanced at T7-8 and T8-9 where there is severe disc space narrowing, endplate sclerosis, and Schmorl's nodes. Small central disc protrusions at T8-9 larger than T7-8 without evidence of significant spinal stenosis on this non-myelographic examination. Neural foraminal stenosis at these levels is most notable at T8-9, likely moderate bilaterally. Milder disc degeneration elsewhere in the thoracic spine. CT LUMBAR SPINE FINDINGS Segmentation: Standard. Alignment: Unchanged minimal anterolisthesis of L3 on L4. Vertebrae: L1 compression fracture with increasing osseous lucency subjacent to the superior endplate with surrounding sclerosis and increased vertebral body height loss compared to the prior CT, now 20%. L2 compression fracture with unchanged mild height loss. No new lumbar spine fracture. No suspicious osseous lesion. Paraspinal and other soft tissues: Mild paravertebral edema at L1. Abdominal aortic atherosclerosis without aneurysm. Disc levels: Similar appearance of disc and facet degeneration compared 2 last month CT with most notable at findings at L3-4 where there is anterolisthesis with bulging uncovered disc and moderate facet and ligamentum flavum hypertrophy resulting in mild spinal stenosis and mild right and mild-to-moderate left neural foraminal stenosis. IMPRESSION: 1. Mild T11 compression fracture, new from 2017 though not clearly acute and therefore of indeterminate age. 2. L1 compression fracture with mildly progressive height loss since 02/03/2018. 3. Unchanged T12 and L2 compression fractures. 4. Thoracic disc degeneration without evidence of significant spinal stenosis. Moderate neural foraminal stenosis at T8-9. 5. Lumbar disc and facet degeneration most notable at L3-4 where  there is mild spinal and mild-to-moderate neural foraminal stenosis. 6.  Aortic Atherosclerosis (ICD10-I70.0). Electronically Signed   By: Logan Bores M.D.   On: 03/11/2018 13:51    Labs:  CBC: Recent Labs    03/09/18 1722 03/10/18 0607 03/11/18 0714 03/12/18 0404  WBC 6.7 6.0 7.3 7.9  HGB 12.5 12.4 12.6 11.7*  HCT 40.6 40.5 40.2 36.7  PLT 182 188 185 176    COAGS: Recent Labs    02/09/18 1147  INR 1.15    BMP: Recent Labs    03/09/18 1722 03/10/18 0607 03/11/18 0714 03/12/18 0404  NA 135 141 141 140  K 4.6 5.3* 3.3* 3.9  CL 103 109 107 108  CO2 19* 20* 20* 20*  GLUCOSE 216* 221* 99 126*  BUN 43* 35* 15 12  CALCIUM 9.0 9.0 9.1 9.1  CREATININE 4.58* 3.22* 2.05* 1.95*  GFRNONAA 8* 13* 22* 23*  GFRAA 9* 14* 25* 27*    LIVER FUNCTION TESTS: Recent Labs    02/09/18 1147 02/13/18 0513 03/10/18 0607  BILITOT 1.0 0.7 0.7  AST 43* 55* 25  ALT 27 34 20  ALKPHOS 84 72 108  PROT 8.6* 6.5 7.3  ALBUMIN 3.6 2.5* 3.2*    TUMOR MARKERS: No results for input(s): AFPTM, CEA, CA199, CHROMGRNA in the last 8760 hours.  Assessment and Plan:  Lumbar 1 compression fracture. Lumbar 2 compression fracture. Reviewed imaging with patient and son. Explained that the best course of management for her lumbar 1 and lumbar 2 compression fractures is with a procedure called a kyphoplasty/vertebroplasty. Explained procedure, including risks and benefits. Explained that the goal of this procedure is to decrease her pain level. Patient demonstrates interest in moving forward with procedure, but asks that she has time to think about her decision.  Plan for follow-up lumbar 1 and lumbar 2 kypoplasty/vertebroplasty ASAP. Informed patient to call us when she has made her decision about the procedure. Instructed patient to discontinue PT until after procedure, as she has a fracture and should not be participating in PT at this time. Instructed patient to continue taking Oxycodone as  directed PRN for back pain.  Patient is currently taking Plavix 75 mg once daily. Informed patient that she will need to be held 5 days prior to procedure.  All questions answered and concerns addressed. Patient and son convey understanding and agree with plan.  Thank you for this interesting consult.  I greatly enjoyed meeting Sapna Padron and look forward to participating in their care.  A copy of this report was sent to the requesting provider on this date.  Electronically Signed: Earley Abide, PA-C 03/17/2018, 9:52 AM   I spent a total of 25 Minutes in face to face in clinical consultation, greater than 50% of which was counseling/coordinating care for lumbar 1 and lumbar 2 compression fractures.

## 2018-03-18 ENCOUNTER — Encounter: Payer: Self-pay | Admitting: Podiatry

## 2018-03-18 ENCOUNTER — Ambulatory Visit (INDEPENDENT_AMBULATORY_CARE_PROVIDER_SITE_OTHER): Payer: Medicare Other | Admitting: Podiatry

## 2018-03-18 DIAGNOSIS — B351 Tinea unguium: Secondary | ICD-10-CM

## 2018-03-18 DIAGNOSIS — E1151 Type 2 diabetes mellitus with diabetic peripheral angiopathy without gangrene: Secondary | ICD-10-CM

## 2018-03-18 DIAGNOSIS — Q828 Other specified congenital malformations of skin: Secondary | ICD-10-CM | POA: Diagnosis not present

## 2018-03-18 DIAGNOSIS — M79609 Pain in unspecified limb: Secondary | ICD-10-CM | POA: Diagnosis not present

## 2018-03-18 DIAGNOSIS — E1140 Type 2 diabetes mellitus with diabetic neuropathy, unspecified: Secondary | ICD-10-CM

## 2018-03-19 DIAGNOSIS — E1122 Type 2 diabetes mellitus with diabetic chronic kidney disease: Secondary | ICD-10-CM | POA: Diagnosis not present

## 2018-03-19 DIAGNOSIS — I5032 Chronic diastolic (congestive) heart failure: Secondary | ICD-10-CM | POA: Diagnosis not present

## 2018-03-19 DIAGNOSIS — I130 Hypertensive heart and chronic kidney disease with heart failure and stage 1 through stage 4 chronic kidney disease, or unspecified chronic kidney disease: Secondary | ICD-10-CM | POA: Diagnosis not present

## 2018-03-19 DIAGNOSIS — I2510 Atherosclerotic heart disease of native coronary artery without angina pectoris: Secondary | ICD-10-CM | POA: Diagnosis not present

## 2018-03-19 DIAGNOSIS — S32020D Wedge compression fracture of second lumbar vertebra, subsequent encounter for fracture with routine healing: Secondary | ICD-10-CM | POA: Diagnosis not present

## 2018-03-19 DIAGNOSIS — G9341 Metabolic encephalopathy: Secondary | ICD-10-CM | POA: Diagnosis not present

## 2018-03-19 NOTE — Progress Notes (Signed)
Subjective:   Patient ID: [name] [name], female   DOB: 84 y.o.   MRN: [id number]   HPI Patient presents with elongated nails 1-5 both feet that are incurvated in the corners and moderately tender   ROS      Objective:  Physical Exam  Neurovascular status unchanged with thick yellow brittle nailbeds 1-5 both feet that are painful     Assessment:  Mycotic nail infection with pain 1-5 both feet     Plan:  Debridement of painful nailbeds 1-5 both feet with no iatrogenic bleeding noted

## 2018-03-20 ENCOUNTER — Telehealth (HOSPITAL_COMMUNITY): Payer: Self-pay

## 2018-03-20 ENCOUNTER — Other Ambulatory Visit: Payer: Self-pay | Admitting: Radiology

## 2018-03-20 NOTE — Telephone Encounter (Signed)
Called to reschedule procedure, no answer, left vm. AW 

## 2018-03-23 ENCOUNTER — Inpatient Hospital Stay: Payer: Medicare Other | Admitting: Internal Medicine

## 2018-03-23 ENCOUNTER — Encounter (HOSPITAL_COMMUNITY): Payer: Self-pay

## 2018-03-23 ENCOUNTER — Other Ambulatory Visit: Payer: Self-pay | Admitting: Radiology

## 2018-03-23 ENCOUNTER — Ambulatory Visit (HOSPITAL_COMMUNITY): Admission: RE | Admit: 2018-03-23 | Payer: Medicare Other | Source: Ambulatory Visit

## 2018-03-23 ENCOUNTER — Other Ambulatory Visit: Payer: Self-pay | Admitting: *Deleted

## 2018-03-23 ENCOUNTER — Ambulatory Visit: Payer: Self-pay | Admitting: *Deleted

## 2018-03-23 NOTE — Patient Outreach (Signed)
[facility]) Care Management  03/23/2018  [name] [name] 1934/06/06 [id number]   Call placed to member's son, [name] [name], to verify member will be available for home visit this afternoon.  No answer, HIPAA compliant voice message left.    Call received back from son [name] [name].  He request to be the contact person as he manages member's care more so than [name] [name].  Both sons names are on the consent.  [name] [name] report member is preparing to have a procedure today and will not be available.  He would like to cancel appointment today, will call this care manager back to reschedule.  State he/member has been overwhelmed with amount of calls regarding managing care (home health, MD appointments, [facility]) and request not to reschedule until he calls this care manager back.  Will cancel visit today and await call back.  Will follow up within the next week if no call back.  [name] [name], [place], MSN [facility] Care Management  [facility] Manager [phone]

## 2018-03-24 ENCOUNTER — Encounter (HOSPITAL_COMMUNITY): Payer: Self-pay

## 2018-03-24 ENCOUNTER — Other Ambulatory Visit (HOSPITAL_COMMUNITY): Payer: Self-pay | Admitting: Interventional Radiology

## 2018-03-24 ENCOUNTER — Ambulatory Visit (HOSPITAL_COMMUNITY)
Admission: RE | Admit: 2018-03-24 | Discharge: 2018-03-24 | Disposition: A | Discharge status: Home / Self Care | Payer: Medicare Other | Source: Ambulatory Visit | Attending: Interventional Radiology | Admitting: Interventional Radiology

## 2018-03-24 DIAGNOSIS — S32020A Wedge compression fracture of second lumbar vertebra, initial encounter for closed fracture: Secondary | ICD-10-CM

## 2018-03-24 DIAGNOSIS — I700 Atherosclerosis of aorta: Secondary | ICD-10-CM | POA: Diagnosis not present

## 2018-03-24 DIAGNOSIS — E039 Hypothyroidism, unspecified: Secondary | ICD-10-CM | POA: Diagnosis not present

## 2018-03-24 DIAGNOSIS — M4804 Spinal stenosis, thoracic region: Secondary | ICD-10-CM | POA: Insufficient documentation

## 2018-03-24 DIAGNOSIS — I420 Dilated cardiomyopathy: Secondary | ICD-10-CM | POA: Insufficient documentation

## 2018-03-24 DIAGNOSIS — Z950 Presence of cardiac pacemaker: Secondary | ICD-10-CM | POA: Diagnosis not present

## 2018-03-24 DIAGNOSIS — M4856XA Collapsed vertebra, not elsewhere classified, lumbar region, initial encounter for fracture: Secondary | ICD-10-CM | POA: Diagnosis not present

## 2018-03-24 DIAGNOSIS — Z7982 Long term (current) use of aspirin: Secondary | ICD-10-CM | POA: Insufficient documentation

## 2018-03-24 DIAGNOSIS — I110 Hypertensive heart disease with heart failure: Secondary | ICD-10-CM | POA: Diagnosis not present

## 2018-03-24 DIAGNOSIS — E7800 Pure hypercholesterolemia, unspecified: Secondary | ICD-10-CM | POA: Insufficient documentation

## 2018-03-24 DIAGNOSIS — M5136 Other intervertebral disc degeneration, lumbar region: Secondary | ICD-10-CM | POA: Insufficient documentation

## 2018-03-24 DIAGNOSIS — Z87891 Personal history of nicotine dependence: Secondary | ICD-10-CM | POA: Diagnosis not present

## 2018-03-24 DIAGNOSIS — Z8673 Personal history of transient ischemic attack (TIA), and cerebral infarction without residual deficits: Secondary | ICD-10-CM | POA: Diagnosis not present

## 2018-03-24 DIAGNOSIS — I5042 Chronic combined systolic (congestive) and diastolic (congestive) heart failure: Secondary | ICD-10-CM | POA: Insufficient documentation

## 2018-03-24 DIAGNOSIS — I447 Left bundle-branch block, unspecified: Secondary | ICD-10-CM | POA: Insufficient documentation

## 2018-03-24 DIAGNOSIS — S32010A Wedge compression fracture of first lumbar vertebra, initial encounter for closed fracture: Secondary | ICD-10-CM

## 2018-03-24 DIAGNOSIS — Z7902 Long term (current) use of antithrombotics/antiplatelets: Secondary | ICD-10-CM | POA: Insufficient documentation

## 2018-03-24 DIAGNOSIS — K219 Gastro-esophageal reflux disease without esophagitis: Secondary | ICD-10-CM | POA: Insufficient documentation

## 2018-03-24 DIAGNOSIS — E119 Type 2 diabetes mellitus without complications: Secondary | ICD-10-CM | POA: Diagnosis not present

## 2018-03-24 DIAGNOSIS — I2510 Atherosclerotic heart disease of native coronary artery without angina pectoris: Secondary | ICD-10-CM | POA: Diagnosis not present

## 2018-03-24 DIAGNOSIS — Z794 Long term (current) use of insulin: Secondary | ICD-10-CM | POA: Diagnosis not present

## 2018-03-24 HISTORY — PX: IR KYPHO EA ADDL LEVEL THORACIC OR LUMBAR: IMG5520

## 2018-03-24 HISTORY — PX: IR KYPHO LUMBAR INC FX REDUCE BONE BX UNI/BIL CANNULATION INC/IMAGING: IMG5519

## 2018-03-24 LAB — BASIC METABOLIC PANEL
Anion gap: 10 (ref 5–15)
BUN: 21 mg/dL (ref 8–23)
CHLORIDE: 106 mmol/L (ref 98–111)
CO2: 25 mmol/L (ref 22–32)
Calcium: 9.7 mg/dL (ref 8.9–10.3)
Creatinine, Ser: 1.56 mg/dL — ABNORMAL HIGH (ref 0.44–1.00)
GFR calc Af Amer: 35 mL/min — ABNORMAL LOW (ref >60)
GFR calc non Af Amer: 30 mL/min — ABNORMAL LOW (ref >60)
Glucose, Bld: 135 mg/dL — ABNORMAL HIGH (ref 70–99)
POTASSIUM: 4 mmol/L (ref 3.5–5.1)
Sodium: 141 mmol/L (ref 135–145)

## 2018-03-24 LAB — PROTIME-INR
INR: 1.04
Prothrombin Time: 13.5 seconds (ref 11.4–15.2)

## 2018-03-24 LAB — CBC
HEMATOCRIT: 40.4 % (ref 36.0–46.0)
Hemoglobin: 12.4 g/dL (ref 12.0–15.0)
MCH: 25.6 pg — AB (ref 26.0–34.0)
MCHC: 30.7 g/dL (ref 30.0–36.0)
MCV: 83.5 fL (ref 78.0–100.0)
Platelets: 270 10*3/uL (ref 150–400)
RBC: 4.84 MIL/uL (ref 3.87–5.11)
RDW: 17.9 % — AB (ref 11.5–15.5)
WBC: 6.4 10*3/uL (ref 4.0–10.5)

## 2018-03-24 MED ORDER — MIDAZOLAM HCL 2 MG/2ML IJ SOLN
INTRAMUSCULAR | Status: AC
  Filled 2018-03-24: qty 2

## 2018-03-24 MED ORDER — TOBRAMYCIN SULFATE 1.2 G IJ SOLR
INTRAMUSCULAR | Status: AC | PRN
  Administered 2018-03-24: .01 g via TOPICAL

## 2018-03-24 MED ORDER — SODIUM CHLORIDE 0.9 % IV SOLN: INTRAVENOUS | Status: DC

## 2018-03-24 MED ORDER — TOBRAMYCIN SULFATE 1.2 G IJ SOLR
INTRAMUSCULAR | Status: AC
  Filled 2018-03-24: qty 1.2

## 2018-03-24 MED ORDER — SODIUM CHLORIDE 0.9 % IV SOLN: INTRAVENOUS | Status: AC

## 2018-03-24 MED ORDER — BUPIVACAINE HCL (PF) 0.5 % IJ SOLN
INTRAMUSCULAR | Status: AC | PRN
  Administered 2018-03-24: 40 mL

## 2018-03-24 MED ORDER — FENTANYL CITRATE (PF) 100 MCG/2ML IJ SOLN
INTRAMUSCULAR | Status: AC
  Filled 2018-03-24: qty 2

## 2018-03-24 MED ORDER — GELATIN ABSORBABLE 12-7 MM EX MISC
CUTANEOUS | Status: AC
  Filled 2018-03-24: qty 1

## 2018-03-24 MED ORDER — MIDAZOLAM HCL 2 MG/2ML IJ SOLN
INTRAMUSCULAR | Status: AC | PRN
  Administered 2018-03-24 (×2): 1 mg via INTRAVENOUS

## 2018-03-24 MED ORDER — FENTANYL CITRATE (PF) 100 MCG/2ML IJ SOLN
INTRAMUSCULAR | Status: AC | PRN
  Administered 2018-03-24 (×3): 25 ug via INTRAVENOUS

## 2018-03-24 MED ORDER — IOPAMIDOL (ISOVUE-300) INJECTION 61%
INTRAVENOUS | Status: AC
  Administered 2018-03-24: 30 mL
  Filled 2018-03-24: qty 50

## 2018-03-24 MED ORDER — CEFAZOLIN SODIUM-DEXTROSE 2-4 GM/100ML-% IV SOLN
INTRAVENOUS | Status: AC
  Filled 2018-03-24: qty 100

## 2018-03-24 MED ORDER — BUPIVACAINE HCL (PF) 0.5 % IJ SOLN
INTRAMUSCULAR | Status: AC
  Filled 2018-03-24: qty 30

## 2018-03-24 MED ORDER — CEFAZOLIN SODIUM-DEXTROSE 2-4 GM/100ML-% IV SOLN
2.0000 g | INTRAVENOUS | Status: AC
  Administered 2018-03-24: 2 g via INTRAVENOUS

## 2018-03-24 MED ORDER — HYDROMORPHONE HCL 1 MG/ML IJ SOLN
INTRAMUSCULAR | Status: AC
  Filled 2018-03-24: qty 1

## 2018-03-24 NOTE — Discharge Instructions (Signed)
1. No stooping,or bending or lifting more than 10 lbs for 2 weeks. 2.Use walker to ambulate for 2 weeks. 3.RTC PRN 2 to 3 weeks  KYPHOPLASTY/VERTEBROPLASTY DISCHARGE INSTRUCTIONS  Medications: (check all that apply)     Resume all home medications as before procedure. plavix restart tomorrow 8/14                       Continue your pain medications as prescribed as needed.  Over the next 3-5 days, decrease your pain medication as tolerated.  Over the counter medications (i.e. Tylenol, ibuprofen, and aleve) may be substituted once severe/moderate pain symptoms have subsided.   Wound Care: - Bandages may be removed the day following your procedure.  You may get your incision wet once bandages are removed.  Bandaids may be used to cover the incisions until scab formation.  Topical ointments are optional.  - If you develop a fever greater than 101 degrees, have increased skin redness at the incision sites or pus-like oozing from incisions occurring within 1 week of the procedure, contact radiology at [phone] or [phone].  - Ice pack to back for 15-20 minutes 2-3 time per day for first 2-3 days post procedure.  The ice will expedite muscle healing and help with the pain from the incisions.   Activity: - Bedrest today with limited activity for 24 hours post procedure.  - No driving for 48 hours.  - Increase your activity as tolerated after bedrest (with assistance if necessary).  - Refrain from any strenuous activity or heavy lifting (greater than 10 lbs.).   Follow up: -   - If a biopsy was performed at the time of your procedure, your referring physician should receive the results in usually 2-3 days.

## 2018-03-24 NOTE — Procedures (Signed)
S/P L1 and L2 balloon KP 

## 2018-03-24 NOTE — H&P (Signed)
Chief Complaint: Patient was seen in consultation today for Lumbar 1-2 Kyphoplasty at the request of Dr Arvilla Market   Supervising Physician: Julieanne Cotton  Patient Status: [facility] - Out-pt  History of Present Illness: [name] [name] is a 84 y.o. female   Hx HTN; CAD; Mult Myeloma; CVA; DM; SSA Fell at home few weeks ago--- pain is worsening  CT thoracic/lumbar spine 03/11/2018: 1. Mild T11 compression fracture, new from 2017 though not clearly acute and therefore of indeterminate age. 2. L1 compression fracture with mildly progressive height loss since 02/03/2018. 3. Unchanged T12 and L2 compression fractures. 4. Thoracic disc degeneration without evidence of significant spinal stenosis. Moderate neural foraminal stenosis at T8-9. 5. Lumbar disc and facet degeneration most notable at L3-4 where there is mild spinal and mild-to-moderate neural foraminal stenosis. 6. Aortic Atherosclerosis (ICD10-I70.0).  Was seen in consultation with Dr Corliss Skains 03/17/2018 Request for L1-2 KP per Dr Fredrich Romans Now scheduled for today LD Plavix 1 week ago   Past Medical History:  Diagnosis Date  . Chronic combined systolic and diastolic CHF, NYHA class 3 (HCC)    a. 03/2013 Echo: EF 25-30%, diff HK, sev antsept HK, mild MR.  . Congestive dilated cardiomyopathy (HCC)    a. 03/2013 Echo: EF 25-30%;  b. 09/2013 s/p SJM 3242 CRT-P.  Marland Kitchen Coronary artery disease    a. 08/2012 Cath: LM nl, LAD 15m, LCX nondom, mild-mod nonobs dzs mid, OM1 ok, OM2 90/90p, RCA 40, EF 25-30%-->Med Rx.  . GERD (gastroesophageal reflux disease)   . High cholesterol   . Hypertension   . Hypothyroidism   . Left bundle branch block   . Osteoarthritis   . Stroke (HCC)   . Type II diabetes mellitus (HCC)     Past Surgical History:  Procedure Laterality Date  . ABDOMINAL HYSTERECTOMY  1980  . BI-VENTRICULAR PACEMAKER INSERTION N/A 10/01/2013   Procedure: BI-VENTRICULAR PACEMAKER INSERTION (CRT-P);  Surgeon: Duke Salvia, MD;  Location: [facility] CATH LAB;  Service: Cardiovascular;  Laterality: N/A;  . BI-VENTRICULAR PACEMAKER INSERTION (CRT-P)     a. 09/2013 s/p SJM 3242 CRT-P.  . Bilateral cateract surgery    . BMD  2006  . IR GENERIC HISTORICAL  10/18/2016   IR ANGIO INTRA EXTRACRAN SEL COM CAROTID INNOMINATE BILAT MOD SED 10/18/2016 Julieanne Cotton, MD [facility]-INTERV RAD  . IR GENERIC HISTORICAL  10/18/2016   IR ANGIO VERTEBRAL SEL SUBCLAVIAN INNOMINATE BILAT MOD SED 10/18/2016 Julieanne Cotton, MD [facility]  . ORIF TIBIA PLATEAU Right 12/04/2013   Procedure: OPEN REDUCTION INTERNAL FIXATION (ORIF) TIBIAL PLATEAU;  Surgeon: Verlee Rossetti, MD;  Location: [facility] ORS;  Service: Orthopedics;  Laterality: Right;  . PERIPHERAL VASCULAR CATHETERIZATION N/A 06/17/2016   Procedure: Lower Extremity Angiography;  Surgeon: Runell Gess, MD;  Location: [facility] INVASIVE CV LAB;  Service: Cardiovascular;  Laterality: N/A;  . PERIPHERAL VASCULAR CATHETERIZATION Left 06/17/2016   Procedure: Peripheral Vascular Atherectomy;  Surgeon: Runell Gess, MD;  Location: [facility] INVASIVE CV LAB;  Service: Cardiovascular;  Laterality: Left;  Popliteal   . RIGHT HEART CATHETERIZATION N/A 09/08/2012   Procedure: RIGHT HEART CATH;  Surgeon: Dolores Patty, MD;  Location: [facility] CATH LAB;  Service: Cardiovascular;  Laterality: N/A;    Allergies: Statins; Metformin and related; and Vytorin [ezetimibe-simvastatin]  Medications: Prior to Admission medications   Medication Sig Start Date End Date Taking? Authorizing Provider  aspirin EC 81 MG tablet Take 81 mg by mouth daily.    Yes [provider]  calcitonin, salmon, (MIACALCIN/FORTICAL) 200 UNIT/ACT nasal spray Place 1 spray into alternate nostrils daily. 03/13/18  Yes Ghimire, Werner Lean, MD  carvedilol (COREG) 12.5 MG tablet Take 6.25 mg by mouth 2 (two) times daily.   Yes [provider]  Cholecalciferol (VITAMIN D3) 5000 units CAPS Take by mouth. Take 1 capsule by mouth every 7  days   Yes [provider]  FREESTYLE LITE test strip USE 1 STRIP TO CHECK GLUCOSE TWICE DAILY 02/09/18  Yes [name], MD  insulin aspart (NOVOLOG) 100 UNIT/ML injection Inject 0-9 Units into the skin 3 (three) times daily with meals. 02/14/18  Yes Zigmund Daniel., MD  levothyroxine (SYNTHROID, LEVOTHROID) 50 MCG tablet TAKE 1 TABLET BY MOUTH ONCE DAILY 09/01/17  Yes Carlus Pavlov, MD  OxyCODONE HCl, Abuse Deter, (OXAYDO) 5 MG TABA Take 5 mg by mouth every 8 (eight) hours as needed. 03/13/18  Yes Ghimire, Werner Lean, MD  rosuvastatin (CRESTOR) 20 MG tablet TAKE 1 TABLET BY MOUTH ONCE DAILY Patient taking differently: TAKE 1 TABLET BY MOUTH IN THE EVENING 01/20/18  Yes [name], MD  acetaminophen (TYLENOL) 500 MG tablet Take 2 tablets (1,000 mg total) by mouth every 8 (eight) hours as needed. 03/13/18 04/12/18  Ghimire, Werner Lean, MD  clopidogrel (PLAVIX) 75 MG tablet TAKE 1 TABLET BY MOUTH ONCE DAILY 01/12/18   [name], MD  lidocaine (LIDODERM) 5 % Place 1 patch onto the skin daily. Remove & Discard patch within 12 hours or as directed by MD 03/13/18   Maretta Bees, MD     Family History  Problem Relation Age of Onset  . Diabetes Mother   . Diabetes Other   . Hypertension Other   . Uterine cancer Other   . Heart attack Brother     Social History   Socioeconomic History  . Marital status: Divorced    Spouse name: Not on file  . Number of children: 4  . Years of education: Not on file  . Highest education level: Not on file  Occupational History  . Occupation: Retired Designer, television/film set  . Financial resource strain: Not on file  . Food insecurity:    Worry: Not on file    Inability: Not on file  . Transportation needs:    Medical: Not on file    Non-medical: Not on file  Tobacco Use  . Smoking status: Former Games developer  . Smokeless tobacco: Former Engineer, water and Sexual Activity  . Alcohol use: No  . Drug use: No  . Sexual activity: Not  Currently  Lifestyle  . Physical activity:    Days per week: Not on file    Minutes per session: Not on file  . Stress: Not on file  Relationships  . Social connections:    Talks on phone: Not on file    Gets together: Not on file    Attends religious service: Not on file    Active member of club or organization: Not on file    Attends meetings of clubs or organizations: Not on file    Relationship status: Not on file  Other Topics Concern  . Not on file  Social History Narrative   Widowed.  Lives with her son.  Normally able to ambulate without assistance.  Quit smoking as a teenager.               Review of Systems: A 12 point ROS discussed and pertinent positives are indicated  in the HPI above.  All other systems are negative.  Review of Systems  Constitutional: Positive for activity change and appetite change. Negative for fever.  Respiratory: Negative for cough and shortness of breath.   Cardiovascular: Negative for chest pain.  Gastrointestinal: Negative for abdominal pain.  Musculoskeletal: Positive for back pain and gait problem.  Neurological: Positive for weakness.  Psychiatric/Behavioral: Positive for confusion. Negative for behavioral problems.    Vital Signs: BP (!) 151/84   Pulse 63   Temp 97.8 F (36.6 C) (Oral)   Resp 16   Ht 5\' 5"  (1.651 m)   Wt 130 lb (59 kg)   SpO2 98%   BMI 21.63 kg/m   Physical Exam  Constitutional:  Frail elderly female  Cardiovascular: Normal rate, regular rhythm and normal heart sounds.  Pulmonary/Chest: Effort normal.  Abdominal: Soft. Bowel sounds are normal.  Musculoskeletal:  Moves all 4s to command Low back pain Uses walker/cane  Neurological: She is alert.  Skin: Skin is warm and dry.  Psychiatric: She has a normal mood and affect. Her behavior is normal.  Consented with Son at bedside  Nursing note and vitals reviewed.   Imaging: Dg Thoracic Spine 2 View  Result Date: 03/09/2018 CLINICAL DATA:  Fall 2  days ago with back pain. EXAM: THORACIC SPINE 2 VIEWS COMPARISON:  Included portion from lumbar spine CT 02/03/2018. Chest radiograph 01/10/2016 FINDINGS: Chronic and unchanged T12 and upper lumbar compression fractures compared with recent lumbar spine CT. T11 compression fracture with approximately 30% loss of height anteriorly was not included in the field of view on prior exam and is age indeterminate, but appears new from 2017 chest radiograph. Mild scoliotic curvature of the upper thoracic spine. IMPRESSION: 1. Age indeterminate T11 compression fracture with approximately 30% loss of height anteriorly. This is new from 2017 chest radiograph. 2. Unchanged compression fractures of T12 and the upper lumbar spine since CT last month. Electronically Signed   By: Rubye Oaks M.D.   On: 03/09/2018 23:53   Dg Lumbar Spine 2-3 Views  Result Date: 03/09/2018 CLINICAL DATA:  Fall 2 days ago with back pain. EXAM: LUMBAR SPINE - 2-3 VIEW COMPARISON:  Lumbar spine CT 02/03/2018 FINDINGS: Unchanged compression fractures of T12, L1, and L2 from prior exam. No significant progression. Compression fracture of T11 with approximately 30% loss of height was not previously included in the field of view and is age indeterminate, not definitely seen on chest radiograph of 01/10/2016. L3, L4, and L5 vertebral body heights are preserved. Similar anterolisthesis of L3 on L4. Posterior elements appear intact. Sacroiliac joints are congruent. Chronic angulation of the sacrococcygeal junction based on scout from prior lumbar spine CT. Advanced vascular calcifications in the abdomen. IMPRESSION: 1. Chronic and unchanged compression fractures of T12, L1, and L2 compared to lumbar spine CT last month. 2. T11 compression fracture with approximately 30% loss of height anteriorly was not previously included in the field of view and is age indeterminate. Electronically Signed   By: Rubye Oaks M.D.   On: 03/09/2018 23:49   Dg  Pelvis 1-2 Views  Result Date: 03/09/2018 CLINICAL DATA:  Fall 2 days ago with back pain. EXAM: PELVIS - 1-2 VIEW COMPARISON:  None. FINDINGS: The cortical margins of the bony pelvis are intact. No fracture. Pubic symphysis and sacroiliac joints are congruent. Both femoral heads are well-seated in the respective acetabula. The bones are under mineralized. IMPRESSION: No visualized pelvic fracture. Electronically Signed   By: Lujean Rave.D.  On: 03/09/2018 23:50   Ct Thoracic Spine Wo Contrast  Result Date: 03/11/2018 CLINICAL DATA:  Severe low back pain radiating to the left lower extremity. Thoracolumbar compression fractures. EXAM: CT THORACIC AND LUMBAR SPINE WITHOUT CONTRAST TECHNIQUE: Multidetector CT imaging of the thoracic and lumbar spine was performed without contrast. Multiplanar CT image reconstructions were also generated. COMPARISON:  Thoracic and lumbar spine radiographs 03/09/2018. Lumbar spine CT 02/03/2018. Chest CTA 07/30/2012. FINDINGS: CT THORACIC SPINE FINDINGS Alignment: Mild upper thoracic levoscoliosis. Straightening of the normal kyphosis in the lower thoracic spine. Trace anterolisthesis of T10 on T11. Vertebrae: T12 superior endplate fracture with close to 50% height loss anteriorly is unchanged from the prior lumbar spine CT. A T11 superior endplate compression fracture with 30% height loss was not included on the prior lumbar spine CT and is new from 2017 chest radiographs but not clearly acute. There is no T11 vertebral body retropulsion. No suspicious osseous lesion is identified. Paraspinal and other soft tissues: No significant paravertebral soft tissue edema or hematoma at T11. Partially visualized ICD leads. Aortic and coronary artery atherosclerosis. Disc levels: Disc degeneration most advanced at T7-8 and T8-9 where there is severe disc space narrowing, endplate sclerosis, and Schmorl's nodes. Small central disc protrusions at T8-9 larger than T7-8 without evidence  of significant spinal stenosis on this non-myelographic examination. Neural foraminal stenosis at these levels is most notable at T8-9, likely moderate bilaterally. Milder disc degeneration elsewhere in the thoracic spine. CT LUMBAR SPINE FINDINGS Segmentation: Standard. Alignment: Unchanged minimal anterolisthesis of L3 on L4. Vertebrae: L1 compression fracture with increasing osseous lucency subjacent to the superior endplate with surrounding sclerosis and increased vertebral body height loss compared to the prior CT, now 20%. L2 compression fracture with unchanged mild height loss. No new lumbar spine fracture. No suspicious osseous lesion. Paraspinal and other soft tissues: Mild paravertebral edema at L1. Abdominal aortic atherosclerosis without aneurysm. Disc levels: Similar appearance of disc and facet degeneration compared 2 last month CT with most notable at findings at L3-4 where there is anterolisthesis with bulging uncovered disc and moderate facet and ligamentum flavum hypertrophy resulting in mild spinal stenosis and mild right and mild-to-moderate left neural foraminal stenosis. IMPRESSION: 1. Mild T11 compression fracture, new from 2017 though not clearly acute and therefore of indeterminate age. 2. L1 compression fracture with mildly progressive height loss since 02/03/2018. 3. Unchanged T12 and L2 compression fractures. 4. Thoracic disc degeneration without evidence of significant spinal stenosis. Moderate neural foraminal stenosis at T8-9. 5. Lumbar disc and facet degeneration most notable at L3-4 where there is mild spinal and mild-to-moderate neural foraminal stenosis. 6.  Aortic Atherosclerosis (ICD10-I70.0). Electronically Signed   By: Sebastian Ache M.D.   On: 03/11/2018 13:51   Ct Lumbar Spine Wo Contrast  Result Date: 03/11/2018 CLINICAL DATA:  Severe low back pain radiating to the left lower extremity. Thoracolumbar compression fractures. EXAM: CT THORACIC AND LUMBAR SPINE WITHOUT  CONTRAST TECHNIQUE: Multidetector CT imaging of the thoracic and lumbar spine was performed without contrast. Multiplanar CT image reconstructions were also generated. COMPARISON:  Thoracic and lumbar spine radiographs 03/09/2018. Lumbar spine CT 02/03/2018. Chest CTA 07/30/2012. FINDINGS: CT THORACIC SPINE FINDINGS Alignment: Mild upper thoracic levoscoliosis. Straightening of the normal kyphosis in the lower thoracic spine. Trace anterolisthesis of T10 on T11. Vertebrae: T12 superior endplate fracture with close to 50% height loss anteriorly is unchanged from the prior lumbar spine CT. A T11 superior endplate compression fracture with 30% height loss was not included on the  prior lumbar spine CT and is new from 2017 chest radiographs but not clearly acute. There is no T11 vertebral body retropulsion. No suspicious osseous lesion is identified. Paraspinal and other soft tissues: No significant paravertebral soft tissue edema or hematoma at T11. Partially visualized ICD leads. Aortic and coronary artery atherosclerosis. Disc levels: Disc degeneration most advanced at T7-8 and T8-9 where there is severe disc space narrowing, endplate sclerosis, and Schmorl's nodes. Small central disc protrusions at T8-9 larger than T7-8 without evidence of significant spinal stenosis on this non-myelographic examination. Neural foraminal stenosis at these levels is most notable at T8-9, likely moderate bilaterally. Milder disc degeneration elsewhere in the thoracic spine. CT LUMBAR SPINE FINDINGS Segmentation: Standard. Alignment: Unchanged minimal anterolisthesis of L3 on L4. Vertebrae: L1 compression fracture with increasing osseous lucency subjacent to the superior endplate with surrounding sclerosis and increased vertebral body height loss compared to the prior CT, now 20%. L2 compression fracture with unchanged mild height loss. No new lumbar spine fracture. No suspicious osseous lesion. Paraspinal and other soft tissues: Mild  paravertebral edema at L1. Abdominal aortic atherosclerosis without aneurysm. Disc levels: Similar appearance of disc and facet degeneration compared 2 last month CT with most notable at findings at L3-4 where there is anterolisthesis with bulging uncovered disc and moderate facet and ligamentum flavum hypertrophy resulting in mild spinal stenosis and mild right and mild-to-moderate left neural foraminal stenosis. IMPRESSION: 1. Mild T11 compression fracture, new from 2017 though not clearly acute and therefore of indeterminate age. 2. L1 compression fracture with mildly progressive height loss since 02/03/2018. 3. Unchanged T12 and L2 compression fractures. 4. Thoracic disc degeneration without evidence of significant spinal stenosis. Moderate neural foraminal stenosis at T8-9. 5. Lumbar disc and facet degeneration most notable at L3-4 where there is mild spinal and mild-to-moderate neural foraminal stenosis. 6.  Aortic Atherosclerosis (ICD10-I70.0). Electronically Signed   By: Sebastian Ache M.D.   On: 03/11/2018 13:51    Labs:  CBC: Recent Labs    03/09/18 1722 03/10/18 0607 03/11/18 0714 03/12/18 0404  WBC 6.7 6.0 7.3 7.9  HGB 12.5 12.4 12.6 11.7*  HCT 40.6 40.5 40.2 36.7  PLT 182 188 185 176    COAGS: Recent Labs    02/09/18 1147  INR 1.15    BMP: Recent Labs    03/09/18 1722 03/10/18 0607 03/11/18 0714 03/12/18 0404  NA 135 141 141 140  K 4.6 5.3* 3.3* 3.9  CL 103 109 107 108  CO2 19* 20* 20* 20*  GLUCOSE 216* 221* 99 126*  BUN 43* 35* 15 12  CALCIUM 9.0 9.0 9.1 9.1  CREATININE 4.58* 3.22* 2.05* 1.95*  GFRNONAA 8* 13* 22* 23*  GFRAA 9* 14* 25* 27*    LIVER FUNCTION TESTS: Recent Labs    02/09/18 1147 02/13/18 0513 03/10/18 0607  BILITOT 1.0 0.7 0.7  AST 43* 55* 25  ALT 27 34 20  ALKPHOS 84 72 108  PROT 8.6* 6.5 7.3  ALBUMIN 3.6 2.5* 3.2*    TUMOR MARKERS: No results for input(s): AFPTM, CEA, CA199, CHROMGRNA in the last 8760 hours.  Assessment and  Plan:  Back pain; no relief with meds Consultation with Dr Corliss Skains - now scheduled for Lumbar 1-2 Kyphoplasty Risks and benefits of Lumbar 1 and 2 KP were discussed with the patient including, but not limited to education regarding the natural healing process of compression fractures without intervention, bleeding, infection, cement migration which may cause spinal cord damage, paralysis, pulmonary embolism or even death.  This interventional procedure involves the use of X-rays and because of the nature of the planned procedure, it is possible that we will have prolonged use of X-ray fluoroscopy.  Potential radiation risks to you include (but are not limited to) the following: - A slightly elevated risk for cancer  several years later in life. This risk is typically less than 0.5% percent. This risk is low in comparison to the normal incidence of human cancer, which is 33% for women and 50% for men according to the [employer] Society. - Radiation induced injury can include skin redness, resembling a rash, tissue breakdown / ulcers and hair loss (which can be temporary or permanent).   The likelihood of either of these occurring depends on the difficulty of the procedure and whether you are sensitive to radiation due to previous procedures, disease, or genetic conditions.   IF your procedure requires a prolonged use of radiation, you will be notified and given written instructions for further action.  It is your responsibility to monitor the irradiated area for the 2 weeks following the procedure and to notify your physician if you are concerned that you have suffered a radiation induced injury.    All of the patient's questions were answered, patient is agreeable to proceed.  Consent signed and in chart.  Thank you for this interesting consult.  I greatly enjoyed meeting [name] [name] and look forward to participating in their care.  A copy of this report was sent to the requesting  provider on this date.  Electronically Signed: Robet Leu, PA-C 03/24/2018, 8:08 AM   I spent a total of    25 Minutes in face to face in clinical consultation, greater than 50% of which was counseling/coordinating care for L1-2 KP

## 2018-03-25 ENCOUNTER — Other Ambulatory Visit: Payer: Self-pay | Admitting: Internal Medicine

## 2018-03-25 ENCOUNTER — Telehealth: Payer: Self-pay | Admitting: Student

## 2018-03-25 DIAGNOSIS — G9341 Metabolic encephalopathy: Secondary | ICD-10-CM | POA: Diagnosis not present

## 2018-03-25 DIAGNOSIS — I5032 Chronic diastolic (congestive) heart failure: Secondary | ICD-10-CM | POA: Diagnosis not present

## 2018-03-25 DIAGNOSIS — I2510 Atherosclerotic heart disease of native coronary artery without angina pectoris: Secondary | ICD-10-CM | POA: Diagnosis not present

## 2018-03-25 DIAGNOSIS — I130 Hypertensive heart and chronic kidney disease with heart failure and stage 1 through stage 4 chronic kidney disease, or unspecified chronic kidney disease: Secondary | ICD-10-CM | POA: Diagnosis not present

## 2018-03-25 DIAGNOSIS — S32020D Wedge compression fracture of second lumbar vertebra, subsequent encounter for fracture with routine healing: Secondary | ICD-10-CM | POA: Diagnosis not present

## 2018-03-25 DIAGNOSIS — E1122 Type 2 diabetes mellitus with diabetic chronic kidney disease: Secondary | ICD-10-CM | POA: Diagnosis not present

## 2018-03-25 NOTE — Telephone Encounter (Signed)
Received message from patient's physical therapist, Campbell Lerner, regarding patient's restrictions post-procedure. Patient underwent an image-guided lumbar 1 and lumbar 2 kyphoplastys 03/24/2018 with Dr. Corliss Skains.  Discussed case with Dr. Corliss Skains.  Informed Janine, PT, of patient restrictions: 1- no bending, stooping, or lifting more than 10 pounds 2- ambulating must be with a walker at all times 3- encourage walking with walker Informed Janine, PT that restrictions are to be kept until patient follows-up with Dr. Corliss Skains in clinic. Informed Janine, PT that follow-up occurs at 2-3 weeks post-procedure.  All questions answered and concerns addressed. Please call IR with questions/concerns.  Waylan Boga Louk, PA-C 03/25/2018, 11:05 AM

## 2018-03-26 NOTE — Discharge Summary (Signed)
PATIENT DETAILS Name: [name] [name] Age: 84 y.o. Sex: female Date of Birth: 07-30-1934 MRN: [id number]. Admitting Physician: Eduard Clos, MD WUJ:[id number], Bernadene Bell, MD  Admit Date: 03/09/2018 Discharge date: 03/13/2018  Recommendations for Outpatient Follow-up:  1. Follow up with PCP in 1-2 weeks 2. Please obtain BMP/CBC in one week 3. Please ensure follow-up with interventional radiology (they will be calling the patient with a follow-up appointment) 4. Will need to hold Plavix for 5 days before kyphoplasty  Admitted From:  Home  Disposition: Home with home health services    Home Health:  Yes  Equipment/Devices: None  Discharge Condition: Stable  CODE STATUS: FULL CODE  Diet recommendation:  Heart Healthy / Carb Modified  Brief Summary: See H&P, Labs, Consult and Test reports for all details in brief,Patient is a 84 y.o. female prior history of DM-2, CKD 4, hypertension, combined chronic systolic and diastolic heart failure, PAD requiring angioplasty in 2017, CVA in 2017 without any residual deficits with fatigue, found to have acute kidney injury with a creatinine of 4.8 severe low back pain likely secondary to a acute T11 compression fracture.  See below for further details  Brief Hospital Course: Acute kidney injury on CKD stage IV: Suspect AKI was hemodynamically mediated-improving with supportive care.  UA without any proteinuria.  Continue to trend creatinine closely in the outpatient setting.  Intractable back pain secondary to T11 compression fracture:  Pain adequately controlled with scheduled Tylenol, nasal calcitonin spray and as needed narcotics.  Patient ambulated with physical therapy with a TSLO brace.  This morning she seems very comfortable and pain is only minimal.  Evaluated by neurosurgery-subsequent recommendations to see if patient is a candidate for IR kyphoplasty.  Spoke with Dr. Terald Sleeper yesterday-patient will need Plavix to be held for 5  days before a kyphoplasty can be done.  For now will continue Plavix-have asked scheduler for IR to see if we can get her an outpatient appointment with Dr. Terald Sleeper.  Once that happens-and a date for kyphoplasty is set, Plavix can be held for 5 days.  Spoke with patient's son over the phone-he is agreeable with this plan.  I have asked him to use narcotics very sparingly-only if her pain is intractable with Tylenol. Work-up including DEXA scan-will be deferred to her PCP.  Hypothyroidism: Continue with levothyroxine  DM-2: CBG stable with SSI.  Dyslipidemia: Continue statin.  Chronic combined systolic and diastolic heart failure: Compensated TTE in 2015 showed a EF around 25-30%, but echo in 2017 showed EF of 60 to 65% with grade 1 diastolic dysfunction.  Has ICD in place.  History of peripheral arterial disease/CVA/noncritical CAD: All stable-appears to be on dual antiplatelet agents.    See above regarding need to hold Plavix 5 days prior to kyphoplasty.  Procedures/Studies: None  Discharge Diagnoses:  Principal Problem:   ARF (acute renal failure) (HCC) Active Problems:   Hypothyroidism   Congestive dilated cardiomyopathy (HCC)   HTN (hypertension)   Chronic systolic heart failure (HCC)   Left bundle branch block   Type 2 diabetes mellitus with diabetic neuropathy, with long-term current use of insulin (HCC)   History of stroke   Discharge Instructions:  Activity:  As tolerated with Full fall precautions use walker/cane & assistance as needed  Discharge Instructions    AMB Referral to [facility] Care [facility]   Complete by:  As directed    Please assign to [facility] RNCM for DM, CHF. Previously attempted outreach by [facility] RNCM. Recent  hospital readmission. PCP Roma Schanz) listed as doing toc. Written consent obtained. Please contact son [name] [phone] for post discharge calls. Secondary contact is son [name] at [phone]. Will have home  health with [facility] To dc home today 03/13/18. Thanks. Raiford Noble, MSN-Ed, RN,BSN-THN Care [facility] Liaison-[phone]   Reason for consult:  Please assign to [facility] RNCM   Diagnoses of:   Diabetes Heart Failure     Expected date of contact:  1-3 days (reserved for hospital discharges)   Diet - low sodium heart healthy   Complete by:  As directed    Diet Carb Modified   Complete by:  As directed    Discharge instructions   Complete by:  As directed    Follow with Primary MD  [name], MD in 1 week  Follow with Interventional Radiology-Dr Deveshwor in 1-2 weeks for possible Kyphoplasty  Please get a complete blood count and chemistry panel checked by your Primary MD at your next visit, and again as instructed by your Primary MD.  Get Medicines reviewed and adjusted: Please take all your medications with you for your next visit with your Primary MD  Laboratory/radiological data: Please request your Primary MD to go over all hospital tests and procedure/radiological results at the follow up, please ask your Primary MD to get all Hospital records sent to his/her office.  In some cases, they will be blood work, cultures and biopsy results pending at the time of your discharge. Please request that your primary care M.D. follows up on these results.  Also Note the following: If you experience worsening of your admission symptoms, develop shortness of breath, life threatening emergency, suicidal or homicidal thoughts you must seek medical attention immediately by calling 911 or calling your MD immediately  if symptoms less severe.  You must read complete instructions/literature along with all the possible adverse reactions/side effects for all the Medicines you take and that have been prescribed to you. Take any new Medicines after you have completely understood and accpet all the possible adverse reactions/side effects.   Do not drive when taking Pain medications or  sleeping medications (Benzodaizepines)  Do not take more than prescribed Pain, Sleep and Anxiety Medications. It is not advisable to combine anxiety,sleep and pain medications without talking with your primary care practitioner  Special Instructions: If you have smoked or chewed Tobacco  in the last 2 yrs please stop smoking, stop any regular Alcohol  and or any Recreational drug use.  Wear Seat belts while driving.  Please note: You were cared for by a hospitalist during your hospital stay. Once you are discharged, your primary care physician will handle any further medical issues. Please note that NO REFILLS for any discharge medications will be authorized once you are discharged, as it is imperative that you return to your primary care physician (or establish a relationship with a primary care physician if you do not have one) for your post hospital discharge needs so that they can reassess your need for medications and monitor your lab values.   Increase activity slowly   Complete by:  As directed      Allergies as of 03/13/2018      Reactions   Statins Other (See Comments)   Leg cramping   Metformin And Related    Pt stopped it due to diarrhea   Vytorin [ezetimibe-simvastatin] Other (See Comments)   Leg cramps      Medication List    TAKE these  medications   acetaminophen 500 MG tablet Commonly known as:  TYLENOL Take 2 tablets (1,000 mg total) by mouth every 8 (eight) hours as needed. What changed:    medication strength  how much to take  when to take this   aspirin EC 81 MG tablet Take 81 mg by mouth daily.   calcitonin (salmon) 200 UNIT/ACT nasal spray Commonly known as:  MIACALCIN/FORTICAL Place 1 spray into alternate nostrils daily.   carvedilol 12.5 MG tablet Commonly known as:  COREG Take 6.25 mg by mouth 2 (two) times daily.   clopidogrel 75 MG tablet Commonly known as:  PLAVIX TAKE 1 TABLET BY MOUTH ONCE DAILY   FREESTYLE LITE test strip Generic  drug:  glucose blood USE 1 STRIP TO CHECK GLUCOSE TWICE DAILY   insulin aspart 100 UNIT/ML injection Commonly known as:  novoLOG Inject 0-9 Units into the skin 3 (three) times daily with meals.   levothyroxine 50 MCG tablet Commonly known as:  SYNTHROID, LEVOTHROID TAKE 1 TABLET BY MOUTH ONCE DAILY   lidocaine 5 % Commonly known as:  LIDODERM Place 1 patch onto the skin daily. Remove & Discard patch within 12 hours or as directed by MD   OxyCODONE HCl (Abuse Deter) 5 MG Taba Commonly known as:  OXAYDO Take 5 mg by mouth every 8 (eight) hours as needed. What changed:  when to take this   rosuvastatin 20 MG tablet Commonly known as:  CRESTOR TAKE 1 TABLET BY MOUTH ONCE DAILY What changed:    how much to take  how to take this  when to take this   Vitamin D3 5000 units Caps Take by mouth. Take 1 capsule by mouth every 7 days     ASK your doctor about these medications   magnesium oxide 400 (241.3 Mg) MG tablet Commonly known as:  MAG-OX Take 1 tablet (400 mg total) by mouth daily. Ask about: Should I take this medication?   multivitamin with minerals Tabs tablet Take 1 tablet by mouth daily. Ask about: Should I take this medication?      Follow-up Information    [name], MD.   Specialty:  Internal Medicine Contact information: [address] [place] [address] [phone]        Julieanne Cotton, MD Follow up.   Specialties:  Interventional Radiology, Radiology Why:  office will call you with a appointment    [phone] Contact information: [address] [place] [address] [phone]        [facility] - Dme Follow up.   Contact information: [address] [place] [address] [phone]          Allergies  Allergen Reactions  . Statins Other (See Comments)    Leg cramping  . Metformin And Related     Pt stopped it due to diarrhea  . Vytorin [Ezetimibe-Simvastatin] Other (See  Comments)    Leg cramps    Consultations:   Neurosurgery  Other Procedures/Studies: Dg Thoracic Spine 2 View  Result Date: 03/09/2018 CLINICAL DATA:  Fall 2 days ago with back pain. EXAM: THORACIC SPINE 2 VIEWS COMPARISON:  Included portion from lumbar spine CT 02/03/2018. Chest radiograph 01/10/2016 FINDINGS: Chronic and unchanged T12 and upper lumbar compression fractures compared with recent lumbar spine CT. T11 compression fracture with approximately 30% loss of height anteriorly was not included in the field of view on prior exam and is age indeterminate, but appears new from 2017 chest  radiograph. Mild scoliotic curvature of the upper thoracic spine. IMPRESSION: 1. Age indeterminate T11 compression fracture with approximately 30% loss of height anteriorly. This is new from 2017 chest radiograph. 2. Unchanged compression fractures of T12 and the upper lumbar spine since CT last month. Electronically Signed   By: Rubye Oaks M.D.   On: 03/09/2018 23:53   Dg Lumbar Spine 2-3 Views  Result Date: 03/09/2018 CLINICAL DATA:  Fall 2 days ago with back pain. EXAM: LUMBAR SPINE - 2-3 VIEW COMPARISON:  Lumbar spine CT 02/03/2018 FINDINGS: Unchanged compression fractures of T12, L1, and L2 from prior exam. No significant progression. Compression fracture of T11 with approximately 30% loss of height was not previously included in the field of view and is age indeterminate, not definitely seen on chest radiograph of 01/10/2016. L3, L4, and L5 vertebral body heights are preserved. Similar anterolisthesis of L3 on L4. Posterior elements appear intact. Sacroiliac joints are congruent. Chronic angulation of the sacrococcygeal junction based on scout from prior lumbar spine CT. Advanced vascular calcifications in the abdomen. IMPRESSION: 1. Chronic and unchanged compression fractures of T12, L1, and L2 compared to lumbar spine CT last month. 2. T11 compression fracture with approximately 30% loss of height  anteriorly was not previously included in the field of view and is age indeterminate. Electronically Signed   By: Rubye Oaks M.D.   On: 03/09/2018 23:49   Dg Pelvis 1-2 Views  Result Date: 03/09/2018 CLINICAL DATA:  Fall 2 days ago with back pain. EXAM: PELVIS - 1-2 VIEW COMPARISON:  None. FINDINGS: The cortical margins of the bony pelvis are intact. No fracture. Pubic symphysis and sacroiliac joints are congruent. Both femoral heads are well-seated in the respective acetabula. The bones are under mineralized. IMPRESSION: No visualized pelvic fracture. Electronically Signed   By: Rubye Oaks M.D.   On: 03/09/2018 23:50   Ct Thoracic Spine Wo Contrast  Result Date: 03/11/2018 CLINICAL DATA:  Severe low back pain radiating to the left lower extremity. Thoracolumbar compression fractures. EXAM: CT THORACIC AND LUMBAR SPINE WITHOUT CONTRAST TECHNIQUE: Multidetector CT imaging of the thoracic and lumbar spine was performed without contrast. Multiplanar CT image reconstructions were also generated. COMPARISON:  Thoracic and lumbar spine radiographs 03/09/2018. Lumbar spine CT 02/03/2018. Chest CTA 07/30/2012. FINDINGS: CT THORACIC SPINE FINDINGS Alignment: Mild upper thoracic levoscoliosis. Straightening of the normal kyphosis in the lower thoracic spine. Trace anterolisthesis of T10 on T11. Vertebrae: T12 superior endplate fracture with close to 50% height loss anteriorly is unchanged from the prior lumbar spine CT. A T11 superior endplate compression fracture with 30% height loss was not included on the prior lumbar spine CT and is new from 2017 chest radiographs but not clearly acute. There is no T11 vertebral body retropulsion. No suspicious osseous lesion is identified. Paraspinal and other soft tissues: No significant paravertebral soft tissue edema or hematoma at T11. Partially visualized ICD leads. Aortic and coronary artery atherosclerosis. Disc levels: Disc degeneration most advanced at T7-8  and T8-9 where there is severe disc space narrowing, endplate sclerosis, and Schmorl's nodes. Small central disc protrusions at T8-9 larger than T7-8 without evidence of significant spinal stenosis on this non-myelographic examination. Neural foraminal stenosis at these levels is most notable at T8-9, likely moderate bilaterally. Milder disc degeneration elsewhere in the thoracic spine. CT LUMBAR SPINE FINDINGS Segmentation: Standard. Alignment: Unchanged minimal anterolisthesis of L3 on L4. Vertebrae: L1 compression fracture with increasing osseous lucency subjacent to the superior endplate with surrounding sclerosis and increased vertebral  body height loss compared to the prior CT, now 20%. L2 compression fracture with unchanged mild height loss. No new lumbar spine fracture. No suspicious osseous lesion. Paraspinal and other soft tissues: Mild paravertebral edema at L1. Abdominal aortic atherosclerosis without aneurysm. Disc levels: Similar appearance of disc and facet degeneration compared 2 last month CT with most notable at findings at L3-4 where there is anterolisthesis with bulging uncovered disc and moderate facet and ligamentum flavum hypertrophy resulting in mild spinal stenosis and mild right and mild-to-moderate left neural foraminal stenosis. IMPRESSION: 1. Mild T11 compression fracture, new from 2017 though not clearly acute and therefore of indeterminate age. 2. L1 compression fracture with mildly progressive height loss since 02/03/2018. 3. Unchanged T12 and L2 compression fractures. 4. Thoracic disc degeneration without evidence of significant spinal stenosis. Moderate neural foraminal stenosis at T8-9. 5. Lumbar disc and facet degeneration most notable at L3-4 where there is mild spinal and mild-to-moderate neural foraminal stenosis. 6.  Aortic Atherosclerosis (ICD10-I70.0). Electronically Signed   By: Sebastian Ache M.D.   On: 03/11/2018 13:51   Ct Lumbar Spine Wo Contrast  Result Date:  03/11/2018 CLINICAL DATA:  Severe low back pain radiating to the left lower extremity. Thoracolumbar compression fractures. EXAM: CT THORACIC AND LUMBAR SPINE WITHOUT CONTRAST TECHNIQUE: Multidetector CT imaging of the thoracic and lumbar spine was performed without contrast. Multiplanar CT image reconstructions were also generated. COMPARISON:  Thoracic and lumbar spine radiographs 03/09/2018. Lumbar spine CT 02/03/2018. Chest CTA 07/30/2012. FINDINGS: CT THORACIC SPINE FINDINGS Alignment: Mild upper thoracic levoscoliosis. Straightening of the normal kyphosis in the lower thoracic spine. Trace anterolisthesis of T10 on T11. Vertebrae: T12 superior endplate fracture with close to 50% height loss anteriorly is unchanged from the prior lumbar spine CT. A T11 superior endplate compression fracture with 30% height loss was not included on the prior lumbar spine CT and is new from 2017 chest radiographs but not clearly acute. There is no T11 vertebral body retropulsion. No suspicious osseous lesion is identified. Paraspinal and other soft tissues: No significant paravertebral soft tissue edema or hematoma at T11. Partially visualized ICD leads. Aortic and coronary artery atherosclerosis. Disc levels: Disc degeneration most advanced at T7-8 and T8-9 where there is severe disc space narrowing, endplate sclerosis, and Schmorl's nodes. Small central disc protrusions at T8-9 larger than T7-8 without evidence of significant spinal stenosis on this non-myelographic examination. Neural foraminal stenosis at these levels is most notable at T8-9, likely moderate bilaterally. Milder disc degeneration elsewhere in the thoracic spine. CT LUMBAR SPINE FINDINGS Segmentation: Standard. Alignment: Unchanged minimal anterolisthesis of L3 on L4. Vertebrae: L1 compression fracture with increasing osseous lucency subjacent to the superior endplate with surrounding sclerosis and increased vertebral body height loss compared to the prior CT,  now 20%. L2 compression fracture with unchanged mild height loss. No new lumbar spine fracture. No suspicious osseous lesion. Paraspinal and other soft tissues: Mild paravertebral edema at L1. Abdominal aortic atherosclerosis without aneurysm. Disc levels: Similar appearance of disc and facet degeneration compared 2 last month CT with most notable at findings at L3-4 where there is anterolisthesis with bulging uncovered disc and moderate facet and ligamentum flavum hypertrophy resulting in mild spinal stenosis and mild right and mild-to-moderate left neural foraminal stenosis. IMPRESSION: 1. Mild T11 compression fracture, new from 2017 though not clearly acute and therefore of indeterminate age. 2. L1 compression fracture with mildly progressive height loss since 02/03/2018. 3. Unchanged T12 and L2 compression fractures. 4. Thoracic disc degeneration without evidence of  significant spinal stenosis. Moderate neural foraminal stenosis at T8-9. 5. Lumbar disc and facet degeneration most notable at L3-4 where there is mild spinal and mild-to-moderate neural foraminal stenosis. 6.  Aortic Atherosclerosis (ICD10-I70.0). Electronically Signed   By: Sebastian Ache M.D.   On: 03/11/2018 13:51     TODAY-DAY OF DISCHARGE:  Subjective:   [name] today has no headache,no chest abdominal pain,no new weakness tingling or numbness, feels much better wants to go home today.   Objective:   Blood pressure (!) 139/96, pulse 72, temperature 97.9 F (36.6 C), temperature source Oral, resp. rate 16, height 5\' 3"  (1.6 m), weight 55 kg, SpO2 97 %. No intake or output data in the 24 hours ending 03/26/18 1548 Filed Weights   03/09/18 2219 03/11/18 2100 03/13/18 0434  Weight: 57.6 kg 56.3 kg 55 kg    Exam: Awake Alert, Oriented *3, No new F.N deficits, Normal affect [place].AT,PERRAL Supple Neck,No JVD, No cervical lymphadenopathy appriciated.  Symmetrical Chest wall movement, Good air movement bilaterally, CTAB RRR,No  Gallops,Rubs or new Murmurs, No Parasternal Heave +ve B.Sounds, Abd Soft, Non tender, No organomegaly appriciated, No rebound -guarding or rigidity. No Cyanosis, Clubbing or edema, No new Rash or bruise   PERTINENT RADIOLOGIC STUDIES: Dg Thoracic Spine 2 View  Result Date: 03/09/2018 CLINICAL DATA:  Fall 2 days ago with back pain. EXAM: THORACIC SPINE 2 VIEWS COMPARISON:  Included portion from lumbar spine CT 02/03/2018. Chest radiograph 01/10/2016 FINDINGS: Chronic and unchanged T12 and upper lumbar compression fractures compared with recent lumbar spine CT. T11 compression fracture with approximately 30% loss of height anteriorly was not included in the field of view on prior exam and is age indeterminate, but appears new from 2017 chest radiograph. Mild scoliotic curvature of the upper thoracic spine. IMPRESSION: 1. Age indeterminate T11 compression fracture with approximately 30% loss of height anteriorly. This is new from 2017 chest radiograph. 2. Unchanged compression fractures of T12 and the upper lumbar spine since CT last month. Electronically Signed   By: Rubye Oaks M.D.   On: 03/09/2018 23:53   Dg Lumbar Spine 2-3 Views  Result Date: 03/09/2018 CLINICAL DATA:  Fall 2 days ago with back pain. EXAM: LUMBAR SPINE - 2-3 VIEW COMPARISON:  Lumbar spine CT 02/03/2018 FINDINGS: Unchanged compression fractures of T12, L1, and L2 from prior exam. No significant progression. Compression fracture of T11 with approximately 30% loss of height was not previously included in the field of view and is age indeterminate, not definitely seen on chest radiograph of 01/10/2016. L3, L4, and L5 vertebral body heights are preserved. Similar anterolisthesis of L3 on L4. Posterior elements appear intact. Sacroiliac joints are congruent. Chronic angulation of the sacrococcygeal junction based on scout from prior lumbar spine CT. Advanced vascular calcifications in the abdomen. IMPRESSION: 1. Chronic and unchanged  compression fractures of T12, L1, and L2 compared to lumbar spine CT last month. 2. T11 compression fracture with approximately 30% loss of height anteriorly was not previously included in the field of view and is age indeterminate. Electronically Signed   By: Rubye Oaks M.D.   On: 03/09/2018 23:49   Dg Pelvis 1-2 Views  Result Date: 03/09/2018 CLINICAL DATA:  Fall 2 days ago with back pain. EXAM: PELVIS - 1-2 VIEW COMPARISON:  None. FINDINGS: The cortical margins of the bony pelvis are intact. No fracture. Pubic symphysis and sacroiliac joints are congruent. Both femoral heads are well-seated in the respective acetabula. The bones are under mineralized. IMPRESSION: No visualized  pelvic fracture. Electronically Signed   By: Rubye Oaks M.D.   On: 03/09/2018 23:50   Ct Thoracic Spine Wo Contrast  Result Date: 03/11/2018 CLINICAL DATA:  Severe low back pain radiating to the left lower extremity. Thoracolumbar compression fractures. EXAM: CT THORACIC AND LUMBAR SPINE WITHOUT CONTRAST TECHNIQUE: Multidetector CT imaging of the thoracic and lumbar spine was performed without contrast. Multiplanar CT image reconstructions were also generated. COMPARISON:  Thoracic and lumbar spine radiographs 03/09/2018. Lumbar spine CT 02/03/2018. Chest CTA 07/30/2012. FINDINGS: CT THORACIC SPINE FINDINGS Alignment: Mild upper thoracic levoscoliosis. Straightening of the normal kyphosis in the lower thoracic spine. Trace anterolisthesis of T10 on T11. Vertebrae: T12 superior endplate fracture with close to 50% height loss anteriorly is unchanged from the prior lumbar spine CT. A T11 superior endplate compression fracture with 30% height loss was not included on the prior lumbar spine CT and is new from 2017 chest radiographs but not clearly acute. There is no T11 vertebral body retropulsion. No suspicious osseous lesion is identified. Paraspinal and other soft tissues: No significant paravertebral soft tissue edema or  hematoma at T11. Partially visualized ICD leads. Aortic and coronary artery atherosclerosis. Disc levels: Disc degeneration most advanced at T7-8 and T8-9 where there is severe disc space narrowing, endplate sclerosis, and Schmorl's nodes. Small central disc protrusions at T8-9 larger than T7-8 without evidence of significant spinal stenosis on this non-myelographic examination. Neural foraminal stenosis at these levels is most notable at T8-9, likely moderate bilaterally. Milder disc degeneration elsewhere in the thoracic spine. CT LUMBAR SPINE FINDINGS Segmentation: Standard. Alignment: Unchanged minimal anterolisthesis of L3 on L4. Vertebrae: L1 compression fracture with increasing osseous lucency subjacent to the superior endplate with surrounding sclerosis and increased vertebral body height loss compared to the prior CT, now 20%. L2 compression fracture with unchanged mild height loss. No new lumbar spine fracture. No suspicious osseous lesion. Paraspinal and other soft tissues: Mild paravertebral edema at L1. Abdominal aortic atherosclerosis without aneurysm. Disc levels: Similar appearance of disc and facet degeneration compared 2 last month CT with most notable at findings at L3-4 where there is anterolisthesis with bulging uncovered disc and moderate facet and ligamentum flavum hypertrophy resulting in mild spinal stenosis and mild right and mild-to-moderate left neural foraminal stenosis. IMPRESSION: 1. Mild T11 compression fracture, new from 2017 though not clearly acute and therefore of indeterminate age. 2. L1 compression fracture with mildly progressive height loss since 02/03/2018. 3. Unchanged T12 and L2 compression fractures. 4. Thoracic disc degeneration without evidence of significant spinal stenosis. Moderate neural foraminal stenosis at T8-9. 5. Lumbar disc and facet degeneration most notable at L3-4 where there is mild spinal and mild-to-moderate neural foraminal stenosis. 6.  Aortic  Atherosclerosis (ICD10-I70.0). Electronically Signed   By: Sebastian Ache M.D.   On: 03/11/2018 13:51   Ct Lumbar Spine Wo Contrast  Result Date: 03/11/2018 CLINICAL DATA:  Severe low back pain radiating to the left lower extremity. Thoracolumbar compression fractures. EXAM: CT THORACIC AND LUMBAR SPINE WITHOUT CONTRAST TECHNIQUE: Multidetector CT imaging of the thoracic and lumbar spine was performed without contrast. Multiplanar CT image reconstructions were also generated. COMPARISON:  Thoracic and lumbar spine radiographs 03/09/2018. Lumbar spine CT 02/03/2018. Chest CTA 07/30/2012. FINDINGS: CT THORACIC SPINE FINDINGS Alignment: Mild upper thoracic levoscoliosis. Straightening of the normal kyphosis in the lower thoracic spine. Trace anterolisthesis of T10 on T11. Vertebrae: T12 superior endplate fracture with close to 50% height loss anteriorly is unchanged from the prior lumbar spine CT. A T11  superior endplate compression fracture with 30% height loss was not included on the prior lumbar spine CT and is new from 2017 chest radiographs but not clearly acute. There is no T11 vertebral body retropulsion. No suspicious osseous lesion is identified. Paraspinal and other soft tissues: No significant paravertebral soft tissue edema or hematoma at T11. Partially visualized ICD leads. Aortic and coronary artery atherosclerosis. Disc levels: Disc degeneration most advanced at T7-8 and T8-9 where there is severe disc space narrowing, endplate sclerosis, and Schmorl's nodes. Small central disc protrusions at T8-9 larger than T7-8 without evidence of significant spinal stenosis on this non-myelographic examination. Neural foraminal stenosis at these levels is most notable at T8-9, likely moderate bilaterally. Milder disc degeneration elsewhere in the thoracic spine. CT LUMBAR SPINE FINDINGS Segmentation: Standard. Alignment: Unchanged minimal anterolisthesis of L3 on L4. Vertebrae: L1 compression fracture with  increasing osseous lucency subjacent to the superior endplate with surrounding sclerosis and increased vertebral body height loss compared to the prior CT, now 20%. L2 compression fracture with unchanged mild height loss. No new lumbar spine fracture. No suspicious osseous lesion. Paraspinal and other soft tissues: Mild paravertebral edema at L1. Abdominal aortic atherosclerosis without aneurysm. Disc levels: Similar appearance of disc and facet degeneration compared 2 last month CT with most notable at findings at L3-4 where there is anterolisthesis with bulging uncovered disc and moderate facet and ligamentum flavum hypertrophy resulting in mild spinal stenosis and mild right and mild-to-moderate left neural foraminal stenosis. IMPRESSION: 1. Mild T11 compression fracture, new from 2017 though not clearly acute and therefore of indeterminate age. 2. L1 compression fracture with mildly progressive height loss since 02/03/2018. 3. Unchanged T12 and L2 compression fractures. 4. Thoracic disc degeneration without evidence of significant spinal stenosis. Moderate neural foraminal stenosis at T8-9. 5. Lumbar disc and facet degeneration most notable at L3-4 where there is mild spinal and mild-to-moderate neural foraminal stenosis. 6.  Aortic Atherosclerosis (ICD10-I70.0). Electronically Signed   By: Sebastian Ache M.D.   On: 03/11/2018 13:51     PERTINENT LAB RESULTS: CBC: Recent Labs    03/24/18 0714  WBC 6.4  HGB 12.4  HCT 40.4  PLT 270   CMET CMP     Component Value Date/Time   NA 141 03/24/2018 0714   NA 137 02/19/2018   K 4.0 03/24/2018 0714   CL 106 03/24/2018 0714   CO2 25 03/24/2018 0714   GLUCOSE 135 (H) 03/24/2018 0714   GLUCOSE 112 (H) 08/14/2006 1339   BUN 21 03/24/2018 0714   BUN 14 02/19/2018   CREATININE 1.56 (H) 03/24/2018 0714   CREATININE 1.29 (H) 06/07/2016 1202   CALCIUM 9.7 03/24/2018 0714   PROT 7.3 03/10/2018 0607   ALBUMIN 3.2 (L) 03/10/2018 0607   AST 25 03/10/2018  0607   ALT 20 03/10/2018 0607   ALKPHOS 108 03/10/2018 0607   BILITOT 0.7 03/10/2018 0607   GFRNONAA 30 (L) 03/24/2018 0714   GFRAA 35 (L) 03/24/2018 0714    GFR Estimated Creatinine Clearance: 23.4 mL/min (A) (by C-G formula based on SCr of 1.56 mg/dL (H)). No results for input(s): LIPASE, AMYLASE in the last 72 hours. No results for input(s): CKTOTAL, CKMB, CKMBINDEX, TROPONINI in the last 72 hours. Invalid input(s): POCBNP No results for input(s): DDIMER in the last 72 hours. No results for input(s): HGBA1C in the last 72 hours. No results for input(s): CHOL, HDL, LDLCALC, TRIG, CHOLHDL, LDLDIRECT in the last 72 hours. No results for input(s): TSH, T4TOTAL, T3FREE, THYROIDAB in  the last 72 hours.  Invalid input(s): FREET3 No results for input(s): VITAMINB12, FOLATE, FERRITIN, TIBC, IRON, RETICCTPCT in the last 72 hours. Coags: Recent Labs    03/24/18 0714  INR 1.04   Microbiology: No results found for this or any previous visit (from the past 240 hour(s)).  FURTHER DISCHARGE INSTRUCTIONS:  Get Medicines reviewed and adjusted: Please take all your medications with you for your next visit with your Primary MD  Laboratory/radiological data: Please request your Primary MD to go over all hospital tests and procedure/radiological results at the follow up, please ask your Primary MD to get all Hospital records sent to his/her office.  In some cases, they will be blood work, cultures and biopsy results pending at the time of your discharge. Please request that your primary care M.D. goes through all the records of your hospital data and follows up on these results.  Also Note the following: If you experience worsening of your admission symptoms, develop shortness of breath, life threatening emergency, suicidal or homicidal thoughts you must seek medical attention immediately by calling 911 or calling your MD immediately  if symptoms less severe.  You must read complete  instructions/literature along with all the possible adverse reactions/side effects for all the Medicines you take and that have been prescribed to you. Take any new Medicines after you have completely understood and accpet all the possible adverse reactions/side effects.   Do not drive when taking Pain medications or sleeping medications (Benzodaizepines)  Do not take more than prescribed Pain, Sleep and Anxiety Medications. It is not advisable to combine anxiety,sleep and pain medications without talking with your primary care practitioner  Special Instructions: If you have smoked or chewed Tobacco  in the last 2 yrs please stop smoking, stop any regular Alcohol  and or any Recreational drug use.  Wear Seat belts while driving.  Please note: You were cared for by a hospitalist during your hospital stay. Once you are discharged, your primary care physician will handle any further medical issues. Please note that NO REFILLS for any discharge medications will be authorized once you are discharged, as it is imperative that you return to your primary care physician (or establish a relationship with a primary care physician if you do not have one) for your post hospital discharge needs so that they can reassess your need for medications and monitor your lab values.  Total Time spent coordinating discharge including counseling, education and face to face time equals  45 minutes.  SignedJeoffrey Massed 03/26/2018 3:48 PM

## 2018-03-28 DIAGNOSIS — I2510 Atherosclerotic heart disease of native coronary artery without angina pectoris: Secondary | ICD-10-CM | POA: Diagnosis not present

## 2018-03-28 DIAGNOSIS — I130 Hypertensive heart and chronic kidney disease with heart failure and stage 1 through stage 4 chronic kidney disease, or unspecified chronic kidney disease: Secondary | ICD-10-CM | POA: Diagnosis not present

## 2018-03-28 DIAGNOSIS — G9341 Metabolic encephalopathy: Secondary | ICD-10-CM | POA: Diagnosis not present

## 2018-03-28 DIAGNOSIS — I5032 Chronic diastolic (congestive) heart failure: Secondary | ICD-10-CM | POA: Diagnosis not present

## 2018-03-28 DIAGNOSIS — S32020D Wedge compression fracture of second lumbar vertebra, subsequent encounter for fracture with routine healing: Secondary | ICD-10-CM | POA: Diagnosis not present

## 2018-03-28 DIAGNOSIS — E1122 Type 2 diabetes mellitus with diabetic chronic kidney disease: Secondary | ICD-10-CM | POA: Diagnosis not present

## 2018-03-30 ENCOUNTER — Other Ambulatory Visit: Payer: Self-pay | Admitting: *Deleted

## 2018-03-30 ENCOUNTER — Other Ambulatory Visit: Payer: Self-pay | Admitting: Internal Medicine

## 2018-03-30 NOTE — Patient Outreach (Signed)
[facility]) Care Management  03/30/2018  Ariahna Hankerson 1933-09-15 [id number]    Call placed to member's son/caregiver, Selena Batten, to follow up on member's current health condition and to reschedule home visit.  No answer, HIPAA compliant voice message left.  Will await call back.     Update:  Call received back from son.  He report member is doing well, denies any urgent concerns.  Agrees to reschedule initial home visit for next week.  Kemper Durie, [place], MSN [facility] Care Management  [facility] Manager [phone]

## 2018-03-31 ENCOUNTER — Other Ambulatory Visit: Payer: Self-pay

## 2018-03-31 DIAGNOSIS — G9341 Metabolic encephalopathy: Secondary | ICD-10-CM | POA: Diagnosis not present

## 2018-03-31 DIAGNOSIS — E1122 Type 2 diabetes mellitus with diabetic chronic kidney disease: Secondary | ICD-10-CM | POA: Diagnosis not present

## 2018-03-31 DIAGNOSIS — I5032 Chronic diastolic (congestive) heart failure: Secondary | ICD-10-CM | POA: Diagnosis not present

## 2018-03-31 DIAGNOSIS — I130 Hypertensive heart and chronic kidney disease with heart failure and stage 1 through stage 4 chronic kidney disease, or unspecified chronic kidney disease: Secondary | ICD-10-CM | POA: Diagnosis not present

## 2018-03-31 DIAGNOSIS — I2510 Atherosclerotic heart disease of native coronary artery without angina pectoris: Secondary | ICD-10-CM | POA: Diagnosis not present

## 2018-03-31 DIAGNOSIS — S32020D Wedge compression fracture of second lumbar vertebra, subsequent encounter for fracture with routine healing: Secondary | ICD-10-CM | POA: Diagnosis not present

## 2018-03-31 MED ORDER — CARVEDILOL 12.5 MG PO TABS: 6.2500 mg | ORAL_TABLET | Freq: Two times a day (BID) | ORAL | 1 refills | Status: AC

## 2018-04-02 ENCOUNTER — Telehealth: Payer: Self-pay | Admitting: Internal Medicine

## 2018-04-02 ENCOUNTER — Other Ambulatory Visit (HOSPITAL_COMMUNITY): Payer: Self-pay | Admitting: Interventional Radiology

## 2018-04-02 DIAGNOSIS — I5032 Chronic diastolic (congestive) heart failure: Secondary | ICD-10-CM | POA: Diagnosis not present

## 2018-04-02 DIAGNOSIS — S32020A Wedge compression fracture of second lumbar vertebra, initial encounter for closed fracture: Secondary | ICD-10-CM

## 2018-04-02 DIAGNOSIS — I130 Hypertensive heart and chronic kidney disease with heart failure and stage 1 through stage 4 chronic kidney disease, or unspecified chronic kidney disease: Secondary | ICD-10-CM | POA: Diagnosis not present

## 2018-04-02 DIAGNOSIS — G9341 Metabolic encephalopathy: Secondary | ICD-10-CM | POA: Diagnosis not present

## 2018-04-02 DIAGNOSIS — E1122 Type 2 diabetes mellitus with diabetic chronic kidney disease: Secondary | ICD-10-CM | POA: Diagnosis not present

## 2018-04-02 DIAGNOSIS — S32020D Wedge compression fracture of second lumbar vertebra, subsequent encounter for fracture with routine healing: Secondary | ICD-10-CM | POA: Diagnosis not present

## 2018-04-02 DIAGNOSIS — S32010A Wedge compression fracture of first lumbar vertebra, initial encounter for closed fracture: Secondary | ICD-10-CM

## 2018-04-02 DIAGNOSIS — I2510 Atherosclerotic heart disease of native coronary artery without angina pectoris: Secondary | ICD-10-CM | POA: Diagnosis not present

## 2018-04-02 NOTE — Telephone Encounter (Signed)
Son dropped off a [employer] form to be completed.  LOV with Dr.Jones was 12/16/2016 Called and LVM to inform the patient will need an office visit to have the form completed, it has been over a year since seeing PCP.

## 2018-04-06 ENCOUNTER — Emergency Department (HOSPITAL_COMMUNITY)
Admission: EM | Admit: 2018-04-06 | Discharge: 2018-04-12 | Disposition: E | Payer: Medicare Other | Attending: Emergency Medicine | Admitting: Emergency Medicine

## 2018-04-06 ENCOUNTER — Other Ambulatory Visit: Payer: Self-pay | Admitting: *Deleted

## 2018-04-06 ENCOUNTER — Ambulatory Visit: Payer: Self-pay | Admitting: *Deleted

## 2018-04-06 ENCOUNTER — Encounter (HOSPITAL_COMMUNITY): Payer: Self-pay | Admitting: Radiology

## 2018-04-06 DIAGNOSIS — Z7902 Long term (current) use of antithrombotics/antiplatelets: Secondary | ICD-10-CM | POA: Insufficient documentation

## 2018-04-06 DIAGNOSIS — E039 Hypothyroidism, unspecified: Secondary | ICD-10-CM | POA: Diagnosis not present

## 2018-04-06 DIAGNOSIS — Z87891 Personal history of nicotine dependence: Secondary | ICD-10-CM | POA: Diagnosis not present

## 2018-04-06 DIAGNOSIS — E119 Type 2 diabetes mellitus without complications: Secondary | ICD-10-CM | POA: Insufficient documentation

## 2018-04-06 DIAGNOSIS — Z79899 Other long term (current) drug therapy: Secondary | ICD-10-CM | POA: Insufficient documentation

## 2018-04-06 DIAGNOSIS — I10 Essential (primary) hypertension: Secondary | ICD-10-CM | POA: Diagnosis not present

## 2018-04-06 DIAGNOSIS — Z794 Long term (current) use of insulin: Secondary | ICD-10-CM | POA: Diagnosis not present

## 2018-04-06 DIAGNOSIS — Z7982 Long term (current) use of aspirin: Secondary | ICD-10-CM | POA: Diagnosis not present

## 2018-04-06 DIAGNOSIS — R404 Transient alteration of awareness: Secondary | ICD-10-CM | POA: Diagnosis not present

## 2018-04-06 DIAGNOSIS — I2510 Atherosclerotic heart disease of native coronary artery without angina pectoris: Secondary | ICD-10-CM | POA: Diagnosis not present

## 2018-04-06 DIAGNOSIS — I469 Cardiac arrest, cause unspecified: Secondary | ICD-10-CM

## 2018-04-06 DIAGNOSIS — R55 Syncope and collapse: Secondary | ICD-10-CM | POA: Diagnosis present

## 2018-04-06 DIAGNOSIS — R000 Tachycardia, unspecified: Secondary | ICD-10-CM | POA: Diagnosis not present

## 2018-04-06 DIAGNOSIS — R4020 Unspecified coma: Secondary | ICD-10-CM | POA: Diagnosis not present

## 2018-04-06 DIAGNOSIS — R0902 Hypoxemia: Secondary | ICD-10-CM | POA: Diagnosis not present

## 2018-04-06 LAB — I-STAT CHEM 8, ED
BUN: 18 mg/dL (ref 8–23)
CHLORIDE: 110 mmol/L (ref 98–111)
CREATININE: 1.5 mg/dL — AB (ref 0.44–1.00)
Calcium, Ion: 1.22 mmol/L (ref 1.15–1.40)
GLUCOSE: 250 mg/dL — AB (ref 70–99)
HEMATOCRIT: 38 % (ref 36.0–46.0)
Hemoglobin: 12.9 g/dL (ref 12.0–15.0)
POTASSIUM: 3.7 mmol/L (ref 3.5–5.1)
Sodium: 140 mmol/L (ref 135–145)
TCO2: 19 mmol/L — ABNORMAL LOW (ref 22–32)

## 2018-04-06 LAB — I-STAT CG4 LACTIC ACID, ED: Lactic Acid, Venous: 7.52 mmol/L (ref 0.5–1.9)

## 2018-04-06 LAB — I-STAT TROPONIN, ED: TROPONIN I, POC: 0.04 ng/mL (ref 0.00–0.08)

## 2018-04-06 MED ORDER — EPINEPHRINE PF 1 MG/10ML IJ SOSY
PREFILLED_SYRINGE | INTRAMUSCULAR | Status: DC | PRN
  Administered 2018-04-06 (×5): 1 mg via INTRAVENOUS

## 2018-04-06 MED FILL — Medication: Qty: 1 | Status: AC

## 2018-04-09 ENCOUNTER — Telehealth: Payer: Self-pay | Admitting: Internal Medicine

## 2018-04-09 ENCOUNTER — Ambulatory Visit (HOSPITAL_COMMUNITY): Payer: Medicare Other

## 2018-04-09 NOTE — Telephone Encounter (Signed)
Received faxed copy of  D/C from [facility] for DR signature, once signed it was faxed back to the [place] home this morning.

## 2018-04-09 NOTE — Telephone Encounter (Signed)
Received D/C for DR signature from [facility]. Faxed Signed D/C to [facility] [phone]

## 2018-04-12 NOTE — Telephone Encounter (Signed)
Forms are not needed.

## 2018-04-12 NOTE — ED Triage Notes (Signed)
Patient was activated as Code Stroke by [facility] and as EMS pulled into ED patient became unresponsive and pulseless. CPR initiated in back of truck.  Patient initially developed slurred speech, right gaze and left sided weakness at 11am while with family at her home.  CPR ongoing on arrival to ED with MD at bedside with RT. Patient PEA on monitor, arrived with IO in place with pressure bag.  See CPR record for EPI times CBG  196

## 2018-04-12 NOTE — Patient Outreach (Signed)
[facility]) Care Management  04-23-18  [name] [name] 1934/05/14 [id number]   Member was scheduled for home visit this afternoon, noted that she arrived in the ED with stroke and coded.  She did not survive stroke.  Will close case at this time.  Kemper Durie, [place], MSN [facility] Care Management  [facility] Manager [phone]

## 2018-04-12 NOTE — Code Documentation (Signed)
Code Stroke page out while patient was in route to hospital. Upon arrival to hospital, pt was in cardiac arrest and patient care handled by EDP.

## 2018-04-12 NOTE — ED Notes (Signed)
Time of death   1228  Pronounced by Dr. Rush Landmark

## 2018-04-12 NOTE — ED Provider Notes (Signed)
[facility] EMERGENCY DEPARTMENT Provider Note   CSN: [id number] Arrival date & time: April 18, 2018  1207     History   Chief Complaint Chief Complaint  Patient presents with  . sTROKE/CPR    HPI [name] [name] is a 84 y.o. female.  The history is provided by the EMS personnel and a relative (two sons). The history is limited by the condition of the patient. No language interpreter was used.  Cardiac Arrest  Witnessed by:  Healthcare provider (in EMS transport) Incident location:  En route to the ED Time since incident:  5 minutes Time before BLS initiated:  Immediate Time before ALS initiated:  Immediate Condition upon EMS arrival:  Agonal respirations and unresponsive Pulse:  Absent Initial cardiac rhythm per EMS:  PEA Treatments prior to arrival:  ACLS protocol Airway:  Intubation in ED Rhythm on admission to ED:  Ventricular tachycardia Associated symptoms: weakness     Past Medical History:  Diagnosis Date  . Chronic combined systolic and diastolic CHF, NYHA class 3 (HCC)    a. 03/2013 Echo: EF 25-30%, diff HK, sev antsept HK, mild MR.  . Congestive dilated cardiomyopathy (HCC)    a. 03/2013 Echo: EF 25-30%;  b. 09/2013 s/p SJM 3242 CRT-P.  Marland Kitchen Coronary artery disease    a. 08/2012 Cath: LM nl, LAD 48m, LCX nondom, mild-mod nonobs dzs mid, OM1 ok, OM2 90/90p, RCA 40, EF 25-30%-->Med Rx.  . GERD (gastroesophageal reflux disease)   . High cholesterol   . Hypertension   . Hypothyroidism   . Left bundle branch block   . Osteoarthritis   . Stroke (HCC)   . Type II diabetes mellitus [facility])     Patient Active Problem List   Diagnosis Date Noted  . ARF (acute renal failure) (HCC) 03/09/2018  . Acute renal failure superimposed on chronic kidney disease (HCC) 02/21/2018  . Acute metabolic encephalopathy 02/21/2018  . Pyuria 02/21/2018  . Compression fracture of L1 lumbar vertebra (HCC) 02/21/2018  . Compression fracture of L2 lumbar vertebra (HCC)  02/21/2018  . Compression fracture of T12 vertebra (HCC) 02/21/2018  . History of stroke 02/21/2018  . AKI (acute kidney injury) (HCC) 02/09/2018  . Encounter for post-traumatic wound check 12/16/2016  . Critical lower limb ischemia 06/17/2016  . Diabetic neuropathy, painful (HCC) 04/24/2016  . Left carotid artery stenosis   . Hypertriglyceridemia, essential 01/10/2016  . 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 Type 2 diabetes mellitus with diabetic neuropathy, with long-term current use of insulin (HCC) 10/06/2015  . Deficiency anemia 03/10/2014  . Osteoporosis, unspecified 03/10/2014  . Left bundle branch block 07/01/2013  . Coronary atherosclerosis of native coronary artery 02/16/2013  . Chronic systolic heart failure (HCC) 08/16/2012  . Congestive dilated cardiomyopathy (HCC) 08/02/2012  . HTN (hypertension) 08/02/2012  . Familial tremor 11/29/2010  . VITAMIN D DEFICIENCY 04/04/2010  . Osteoarthrosis, unspecified whether generalized or localized, unspecified site 04/04/2010  . Hyperlipidemia with target LDL less than 70 01/19/2007  . Hypothyroidism 01/14/2007    Past Surgical History:  Procedure Laterality Date  . ABDOMINAL HYSTERECTOMY  1980  . BI-VENTRICULAR PACEMAKER INSERTION N/A 10/01/2013   Procedure: BI-VENTRICULAR PACEMAKER INSERTION (CRT-P);  Surgeon: Duke Salvia, MD;  Location: [facility] CATH LAB;  Service: Cardiovascular;  Laterality: N/A;  . BI-VENTRICULAR PACEMAKER INSERTION (CRT-P)     a. 09/2013 s/p SJM 3242 CRT-P.  . Bilateral cateract surgery    . BMD  2006  . IR  GENERIC HISTORICAL  10/18/2016   IR ANGIO INTRA EXTRACRAN SEL COM CAROTID INNOMINATE BILAT MOD SED 10/18/2016 Julieanne Cotton, MD [facility]-INTERV RAD  . IR GENERIC HISTORICAL  10/18/2016   IR ANGIO VERTEBRAL SEL SUBCLAVIAN INNOMINATE BILAT MOD SED 10/18/2016 Julieanne Cotton, MD [facility]INTER[facility]  . ORIF TIBIA PLATEAU Right 12/04/2013   Procedure: OPEN REDUCTION  INTERNAL FIXATION (ORIF) TIBIAL PLATEAU;  Surgeon: Verlee Rossetti, MD;  Location: [facility] ORS;  Service: Orthopedics;  Laterality: Right;  . PERIPHERAL VASCULAR CATHETERIZATION N/A 06/17/2016   Procedure: Lower Extremity Angiography;  Surgeon: Runell Gess, MD;  Location: [facility] INVASIVE CV LAB;  Service: Cardiovascular;  Laterality: N/A;  . PERIPHERAL VASCULAR CATHETERIZATION Left 06/17/2016   Procedure: Peripheral Vascular Atherectomy;  Surgeon: Runell Gess, MD;  Location: [facility] INVASIVE CV LAB;  Service: Cardiovascular;  Laterality: Left;  Popliteal   . RIGHT HEART CATHETERIZATION N/A 09/08/2012   Procedure: RIGHT HEART CATH;  Surgeon: Dolores Patty, MD;  [facility];  Service: Cardiovascular;  Laterality: N/A;     OB History   None      Home Medications    Prior to Admission medications   Medication Sig Start Date End Date Taking? Authorizing Provider  acetaminophen (TYLENOL) 500 MG tablet Take 2 tablets (1,000 mg total) by mouth every 8 (eight) hours as needed. 03/13/18 04/12/18  GhimireWerner Lean, MD  aspirin EC 81 MG tablet Take 81 mg by mouth daily.     [provider]  calcitonin, salmon, (MIACALCIN/FORTICAL) 200 UNIT/ACT nasal spray Place 1 spray into alternate nostrils daily. 03/13/18   Ghimire, Werner Lean, MD  carvedilol (COREG) 12.5 MG tablet Take 0.5 tablets (6.25 mg total) by mouth 2 (two) times daily. 03/31/18   [name], MD  Cholecalciferol (VITAMIN D3) 5000 units CAPS Take by mouth. Take 1 capsule by mouth every 7 days    [provider]  clopidogrel (PLAVIX) 75 MG tablet TAKE 1 TABLET BY MOUTH ONCE DAILY 01/12/18   [name], MD  FREESTYLE LITE test strip USE 1 STRIP TO CHECK GLUCOSE TWICE DAILY 02/09/18   [name], MD  insulin aspart (NOVOLOG) 100 UNIT/ML injection Inject 0-9 Units into the skin 3 (three) times daily with meals. 02/14/18   Zigmund Daniel., MD  levothyroxine (SYNTHROID, LEVOTHROID) 50 MCG tablet TAKE 1 TABLET BY  MOUTH ONCE DAILY 09/01/17   Carlus Pavlov, MD  lidocaine (LIDODERM) 5 % Place 1 patch onto the skin daily. Remove & Discard patch within 12 hours or as directed by MD 03/13/18   Ghimire, Werner Lean, MD  OxyCODONE HCl, Abuse Deter, (OXAYDO) 5 MG TABA Take 5 mg by mouth every 8 (eight) hours as needed. 03/13/18   Ghimire, Werner Lean, MD  rosuvastatin (CRESTOR) 20 MG tablet TAKE 1 TABLET BY MOUTH ONCE DAILY Patient taking differently: TAKE 1 TABLET BY MOUTH IN THE EVENING 01/20/18   [name], MD    Family History Family History  Problem Relation Age of Onset  . Diabetes Mother   . Diabetes Other   . Hypertension Other   . Uterine cancer Other   . Heart attack Brother     Social History Social History   Tobacco Use  . Smoking status: Former Games developer  . Smokeless tobacco: Former Engineer, water Use Topics  . Alcohol use: No  . Drug use: No     Allergies   Statins; Metformin and related; and Vytorin [ezetimibe-simvastatin]   Review of Systems  Review of Systems  Unable to perform ROS: Patient unresponsive  Neurological: Positive for facial asymmetry and weakness.     Physical Exam Updated Vital Signs There were no vitals taken for this visit.  Physical Exam  Constitutional: She appears ill. Face mask in place.  HENT:  Head: Normocephalic and atraumatic.  Nose: Nose normal.  Mouth/Throat: Oropharynx is clear and moist. No oropharyngeal exudate.  Eyes: Conjunctivae are normal. Right pupil is not reactive. Left pupil is not reactive.  Fixed and dilated bilaterally  Cardiovascular:  No murmur heard. Pulmonary/Chest: She is in respiratory distress. She has rhonchi.  Apneic on arrival.  Abdominal: There is no tenderness.  Neurological: She is unresponsive. She exhibits abnormal muscle tone. GCS eye subscore is 1. GCS verbal subscore is 1. GCS motor subscore is 1.  GCS of 3 with pupils dilated and fixed on arrival.  CPR in progress on arrival.  Skin: She is not  diaphoretic.  Nursing note and vitals reviewed.    ED Treatments / Results  Labs (all labs ordered are listed, but only abnormal results are displayed) Labs Reviewed  I-STAT CHEM 8, ED - Abnormal; Notable for the following components:      Result Value   Creatinine, Ser 1.50 (*)    Glucose, Bld 250 (*)    TCO2 19 (*)    All other components within normal limits  I-STAT CG4 LACTIC ACID, ED - Abnormal; Notable for the following components:   Lactic Acid, Venous 7.52 (*)    All other components within normal limits  I-STAT TROPONIN, ED    EKG None  Radiology No results found.  Procedures Procedure Name: Intubation Date/Time: 04-22-2018 4:58 PM Performed by: Heide Scales, MD Pre-anesthesia Checklist: Patient identified, Emergency Drugs available, Suction available and Patient being monitored Oxygen Delivery Method: Ambu bag Preoxygenation: Pre-oxygenation with 100% oxygen Laryngoscope Size: Glidescope Grade View: Grade I Tube size: 7.5 mm Number of attempts: 1 Placement Confirmation: ETT inserted through vocal cords under direct vision,  Positive ETCO2,  CO2 detector and Breath sounds checked- equal and bilateral Secured at: 22 cm Tube secured with: ETT holder Dental Injury: Teeth and Oropharynx as per pre-operative assessment       (including critical care time)  CRITICAL CARE Performed by: Canary Brim Layann Bluett Total critical care time: 45 minutes Critical care time was exclusive of separately billable procedures and treating other patients. Critical care was necessary to treat or prevent imminent or life-threatening deterioration. Critical care was time spent personally by me on the following activities: development of treatment plan with patient and/or surrogate as well as nursing, discussions with consultants, evaluation of patient's response to treatment, examination of patient, obtaining history from patient or surrogate, ordering and performing  treatments and interventions, ordering and review of laboratory studies, ordering and review of radiographic studies, pulse oximetry and re-evaluation of patient's condition.   Medications Ordered in ED Medications  EPINEPHrine (ADRENALIN) 1 MG/10ML injection (1 mg Intravenous Given April 22, 2018 1226)     Initial Impression / Assessment and Plan / ED Course  I have reviewed the triage vital signs and the nursing notes.  Pertinent labs & imaging results that were available during my care of the patient were reviewed by me and considered in my medical decision making (see chart for details).     [name] [name] is a 84 y.o. female with a past medical history significant for prior stroke, diabetes, CAD, CHF, hypertension, and osteoarthritis who presents as a code stroke and active CPR.  According to EMS, patient was last normal at 11 AM.  Patient was found to have slurred speech, left-sided weakness, and rightward gaze.  During transport, patient lost pulses and was unresponsive.  Patient had dilated pupils bilaterally.  Patient had an IO placed with EMS and was receiving fluids and had already received 1 dose of epinephrine.  On my arrival, CPR was continued.  Patient found to have GCS of 3.  Patient had dilated pupils bilaterally.  Patient's lungs were clear.  Patient initially had a nasal trumpet in place.  Patient was not protecting her airway and decision made to intubate.  Patient was intubated without difficulty with no sedation.  Patient did not respond to have a gag.  Patient received 5 doses of epinephrine and received 2 shocks for pulseless V. tach.  Patient was given amiodarone.  Cardiac ultrasound showed minimal cardiac activity with no evidence of pericardial effusion.  CPR was continued for period of time without any return of spontaneous circulation.  Despite our efforts, time of death was 12:28 PM.  Patient's family was told immediately following, 2 of patient's sons were in the  ED.  Clinically I suspect based on the patient's history of facial droop and unilateral weakness before going unresponsive, she had an intracerebral hemorrhage causing herniation and ultimately death.    The medical examiner was called he did not feel this was an ME case.  1:02 PM Just spoke with the office of Dr. Yetta Barre, patient's PCP.  They report that they will fill the death certificate for the patient.    Patient's family was notified in the emergency department who came to see her at the bedside.   Final Clinical Impressions(s) / ED Diagnoses   Final diagnoses:  Cardiac arrest (HCC)    Clinical Impression: 1. Cardiac arrest [facility])      Condition: Expired    Zacary Bauer, Canary Brim, MD 05/01/2018 1705

## 2018-04-12 DEATH — deceased

## 2018-04-14 ENCOUNTER — Ambulatory Visit: Payer: Medicare Other | Admitting: Internal Medicine

## 2018-04-16 ENCOUNTER — Encounter (HOSPITAL_COMMUNITY): Payer: Self-pay | Admitting: Interventional Radiology

## 2018-04-16 ENCOUNTER — Telehealth: Payer: Self-pay

## 2018-04-16 NOTE — Telephone Encounter (Signed)
On 04/16/18 I received a d/c from [facility] (original). The d/c is for burial.  The patient is a patient of Doctor Sanda Linger.   The d/c will be taken to Primary Care for signature.  On 04/20/18 I received the d/c back from Doctor Yetta Barre.  I got the d/c ready and called the funeral home to let them know I mailed the d/c to vital records per the funeral home request.

## 2018-04-17 NOTE — Telephone Encounter (Signed)
[facility] called in to follow up on Original death certificate. [facility] home stated that inside of the envelope there is another envelope addressed to the health dept. They would like for office to put form in envelope and forward it along.    CB: if needed. [phone]

## 2018-04-21 ENCOUNTER — Ambulatory Visit: Payer: Medicare Other | Admitting: Internal Medicine

## 2018-06-18 ENCOUNTER — Ambulatory Visit: Payer: Medicare Other | Admitting: Podiatry
